# Patient Record
Sex: Female | Born: 1985 | Race: White | Hispanic: No | Smoking: Never smoker
Health system: Southern US, Community
[De-identification: ages and names within clinical notes are randomized; demographics above are authoritative.]

## PROBLEM LIST (undated history)

## (undated) DIAGNOSIS — J45909 Unspecified asthma, uncomplicated: Secondary | ICD-10-CM

## (undated) DIAGNOSIS — R011 Cardiac murmur, unspecified: Secondary | ICD-10-CM

## (undated) DIAGNOSIS — F319 Bipolar disorder, unspecified: Secondary | ICD-10-CM

## (undated) DIAGNOSIS — N809 Endometriosis, unspecified: Secondary | ICD-10-CM

## (undated) DIAGNOSIS — F419 Anxiety disorder, unspecified: Secondary | ICD-10-CM

## (undated) DIAGNOSIS — K219 Gastro-esophageal reflux disease without esophagitis: Secondary | ICD-10-CM

## (undated) DIAGNOSIS — E559 Vitamin D deficiency, unspecified: Secondary | ICD-10-CM

## (undated) DIAGNOSIS — D649 Anemia, unspecified: Secondary | ICD-10-CM

## (undated) DIAGNOSIS — L309 Dermatitis, unspecified: Secondary | ICD-10-CM

## (undated) DIAGNOSIS — J189 Pneumonia, unspecified organism: Secondary | ICD-10-CM

## (undated) DIAGNOSIS — F32A Depression, unspecified: Secondary | ICD-10-CM

## (undated) DIAGNOSIS — Q796 Ehlers-Danlos syndrome, unspecified: Secondary | ICD-10-CM

## (undated) DIAGNOSIS — F909 Attention-deficit hyperactivity disorder, unspecified type: Secondary | ICD-10-CM

## (undated) DIAGNOSIS — T8859XA Other complications of anesthesia, initial encounter: Secondary | ICD-10-CM

## (undated) DIAGNOSIS — K3184 Gastroparesis: Secondary | ICD-10-CM

## (undated) DIAGNOSIS — J069 Acute upper respiratory infection, unspecified: Secondary | ICD-10-CM

## (undated) DIAGNOSIS — F329 Major depressive disorder, single episode, unspecified: Secondary | ICD-10-CM

## (undated) HISTORY — DX: Depression, unspecified: F32.A

## (undated) HISTORY — DX: Vitamin D deficiency, unspecified: E55.9

## (undated) HISTORY — PX: OTHER SURGICAL HISTORY: SHX169

## (undated) HISTORY — PX: NASAL SEPTUM SURGERY: SHX37

## (undated) HISTORY — DX: Attention-deficit hyperactivity disorder, unspecified type: F90.9

## (undated) HISTORY — DX: Anxiety disorder, unspecified: F41.9

## (undated) HISTORY — PX: TONGUE FLAP: SHX2536

## (undated) HISTORY — PX: WISDOM TOOTH EXTRACTION: SHX21

## (undated) HISTORY — PX: UPPER GASTROINTESTINAL ENDOSCOPY: SHX188

## (undated) HISTORY — DX: Major depressive disorder, single episode, unspecified: F32.9

## (undated) HISTORY — DX: Acute upper respiratory infection, unspecified: J06.9

## (undated) HISTORY — DX: Bipolar disorder, unspecified: F31.9

## (undated) HISTORY — DX: Dermatitis, unspecified: L30.9

## (undated) HISTORY — PX: COLONOSCOPY: SHX174

## (undated) HISTORY — PX: SINOSCOPY: SHX187

---
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
## 2006-01-05 ENCOUNTER — Ambulatory Visit: Payer: Self-pay | Admitting: Psychology

## 2006-01-15 ENCOUNTER — Ambulatory Visit (HOSPITAL_COMMUNITY): Payer: Self-pay | Admitting: Psychiatry

## 2006-01-19 ENCOUNTER — Ambulatory Visit: Payer: Self-pay | Admitting: Psychologist

## 2006-02-22 ENCOUNTER — Ambulatory Visit: Payer: Self-pay | Admitting: Psychologist

## 2006-03-08 ENCOUNTER — Ambulatory Visit: Payer: Self-pay | Admitting: Psychologist

## 2006-03-21 ENCOUNTER — Ambulatory Visit: Payer: Self-pay | Admitting: Psychologist

## 2006-03-30 ENCOUNTER — Ambulatory Visit: Payer: Self-pay | Admitting: Psychologist

## 2006-05-11 ENCOUNTER — Ambulatory Visit: Payer: Self-pay | Admitting: Psychologist

## 2006-05-29 ENCOUNTER — Ambulatory Visit: Payer: Self-pay | Admitting: Psychologist

## 2006-06-06 ENCOUNTER — Ambulatory Visit (HOSPITAL_COMMUNITY): Payer: Self-pay | Admitting: Psychiatry

## 2006-06-07 ENCOUNTER — Ambulatory Visit: Payer: Self-pay | Admitting: Psychologist

## 2006-07-03 ENCOUNTER — Ambulatory Visit: Payer: Self-pay | Admitting: Psychologist

## 2006-07-19 ENCOUNTER — Ambulatory Visit: Payer: Self-pay | Admitting: Psychologist

## 2006-07-24 ENCOUNTER — Ambulatory Visit (HOSPITAL_COMMUNITY): Payer: Self-pay | Admitting: Psychiatry

## 2006-08-09 ENCOUNTER — Ambulatory Visit: Payer: Self-pay | Admitting: Psychologist

## 2006-08-21 ENCOUNTER — Ambulatory Visit: Payer: Self-pay | Admitting: Psychologist

## 2006-09-04 ENCOUNTER — Ambulatory Visit: Payer: Self-pay | Admitting: Psychologist

## 2006-09-14 ENCOUNTER — Ambulatory Visit: Payer: Self-pay | Admitting: Psychologist

## 2006-09-20 ENCOUNTER — Ambulatory Visit: Payer: Self-pay | Admitting: Psychologist

## 2006-10-12 ENCOUNTER — Ambulatory Visit: Payer: Self-pay | Admitting: Psychologist

## 2006-10-23 ENCOUNTER — Ambulatory Visit: Payer: Self-pay | Admitting: Psychologist

## 2006-12-04 ENCOUNTER — Ambulatory Visit: Payer: Self-pay | Admitting: Psychologist

## 2006-12-13 ENCOUNTER — Ambulatory Visit (HOSPITAL_COMMUNITY): Payer: Self-pay | Admitting: Psychiatry

## 2006-12-20 ENCOUNTER — Ambulatory Visit: Payer: Self-pay | Admitting: Psychologist

## 2007-01-16 ENCOUNTER — Ambulatory Visit: Payer: Self-pay | Admitting: Psychologist

## 2007-01-25 ENCOUNTER — Other Ambulatory Visit: Admission: RE | Admit: 2007-01-25 | Discharge: 2007-01-25 | Payer: Self-pay | Admitting: Family Medicine

## 2007-02-12 ENCOUNTER — Ambulatory Visit: Payer: Self-pay | Admitting: Psychologist

## 2007-02-26 ENCOUNTER — Ambulatory Visit: Payer: Self-pay | Admitting: Psychologist

## 2007-03-12 ENCOUNTER — Ambulatory Visit: Payer: Self-pay | Admitting: Psychologist

## 2007-03-19 ENCOUNTER — Ambulatory Visit: Payer: Self-pay | Admitting: Psychologist

## 2007-04-02 ENCOUNTER — Ambulatory Visit: Payer: Self-pay | Admitting: Psychologist

## 2007-04-09 ENCOUNTER — Ambulatory Visit: Payer: Self-pay | Admitting: Psychologist

## 2007-04-10 ENCOUNTER — Ambulatory Visit (HOSPITAL_COMMUNITY): Payer: Self-pay | Admitting: Psychiatry

## 2007-04-16 ENCOUNTER — Ambulatory Visit: Payer: Self-pay | Admitting: Psychologist

## 2007-04-30 ENCOUNTER — Ambulatory Visit: Payer: Self-pay | Admitting: Psychologist

## 2007-05-14 ENCOUNTER — Ambulatory Visit: Payer: Self-pay | Admitting: Psychologist

## 2007-05-28 ENCOUNTER — Ambulatory Visit: Payer: Self-pay | Admitting: Psychologist

## 2007-05-29 ENCOUNTER — Ambulatory Visit: Payer: Self-pay | Admitting: Psychologist

## 2007-06-04 ENCOUNTER — Ambulatory Visit: Payer: Self-pay | Admitting: Psychologist

## 2007-06-26 ENCOUNTER — Ambulatory Visit (HOSPITAL_COMMUNITY): Payer: Self-pay | Admitting: Psychiatry

## 2007-07-10 ENCOUNTER — Ambulatory Visit: Payer: Self-pay | Admitting: Psychologist

## 2007-07-23 ENCOUNTER — Ambulatory Visit: Payer: Self-pay | Admitting: Psychologist

## 2007-07-30 ENCOUNTER — Ambulatory Visit: Payer: Self-pay | Admitting: Psychologist

## 2007-08-13 ENCOUNTER — Ambulatory Visit: Payer: Self-pay | Admitting: Psychologist

## 2007-09-03 ENCOUNTER — Ambulatory Visit: Payer: Self-pay | Admitting: Psychologist

## 2007-09-05 ENCOUNTER — Ambulatory Visit (HOSPITAL_COMMUNITY): Payer: Self-pay | Admitting: Psychiatry

## 2007-09-17 ENCOUNTER — Ambulatory Visit: Payer: Self-pay | Admitting: Psychologist

## 2007-09-23 ENCOUNTER — Ambulatory Visit (HOSPITAL_COMMUNITY): Payer: Self-pay | Admitting: Psychiatry

## 2007-10-01 ENCOUNTER — Ambulatory Visit: Payer: Self-pay | Admitting: Psychologist

## 2007-10-15 ENCOUNTER — Ambulatory Visit: Payer: Self-pay | Admitting: Psychologist

## 2007-10-29 ENCOUNTER — Ambulatory Visit: Payer: Self-pay | Admitting: Psychologist

## 2007-12-10 ENCOUNTER — Ambulatory Visit: Payer: Self-pay | Admitting: Psychologist

## 2008-01-07 ENCOUNTER — Ambulatory Visit: Payer: Self-pay | Admitting: Pediatrics

## 2008-01-21 ENCOUNTER — Ambulatory Visit: Payer: Self-pay | Admitting: Psychologist

## 2008-02-18 ENCOUNTER — Other Ambulatory Visit: Admission: RE | Admit: 2008-02-18 | Discharge: 2008-02-18 | Payer: Self-pay | Admitting: Family Medicine

## 2008-03-03 ENCOUNTER — Ambulatory Visit: Payer: Self-pay | Admitting: Psychologist

## 2008-03-05 ENCOUNTER — Ambulatory Visit (HOSPITAL_COMMUNITY): Payer: Self-pay | Admitting: Psychiatry

## 2008-03-17 ENCOUNTER — Ambulatory Visit: Payer: Self-pay | Admitting: Psychologist

## 2008-03-19 ENCOUNTER — Encounter: Admission: RE | Admit: 2008-03-19 | Discharge: 2008-03-19 | Payer: Self-pay | Admitting: Gastroenterology

## 2008-03-31 ENCOUNTER — Ambulatory Visit: Payer: Self-pay | Admitting: Psychologist

## 2008-04-13 ENCOUNTER — Ambulatory Visit (HOSPITAL_COMMUNITY): Admission: RE | Admit: 2008-04-13 | Discharge: 2008-04-13 | Payer: Self-pay | Admitting: Gastroenterology

## 2008-04-14 ENCOUNTER — Ambulatory Visit: Payer: Self-pay | Admitting: Psychologist

## 2008-04-28 ENCOUNTER — Ambulatory Visit: Payer: Self-pay | Admitting: Psychologist

## 2008-04-30 ENCOUNTER — Ambulatory Visit (HOSPITAL_COMMUNITY): Payer: Self-pay | Admitting: Psychiatry

## 2008-05-07 ENCOUNTER — Ambulatory Visit: Admission: RE | Admit: 2008-05-07 | Discharge: 2008-05-07 | Payer: Self-pay | Admitting: Gastroenterology

## 2008-05-26 ENCOUNTER — Ambulatory Visit: Payer: Self-pay | Admitting: Psychologist

## 2008-06-02 ENCOUNTER — Ambulatory Visit: Payer: Self-pay | Admitting: Psychologist

## 2008-06-09 ENCOUNTER — Ambulatory Visit: Payer: Self-pay | Admitting: Psychologist

## 2008-07-07 ENCOUNTER — Ambulatory Visit: Payer: Self-pay | Admitting: Psychologist

## 2008-07-10 ENCOUNTER — Ambulatory Visit (HOSPITAL_COMMUNITY): Payer: Self-pay | Admitting: Psychiatry

## 2008-07-21 ENCOUNTER — Ambulatory Visit: Payer: Self-pay | Admitting: Psychologist

## 2008-08-04 ENCOUNTER — Ambulatory Visit: Payer: Self-pay | Admitting: Psychologist

## 2008-08-18 ENCOUNTER — Ambulatory Visit: Payer: Self-pay | Admitting: Psychologist

## 2008-09-01 ENCOUNTER — Ambulatory Visit: Payer: Self-pay | Admitting: Psychologist

## 2008-09-14 ENCOUNTER — Emergency Department (HOSPITAL_COMMUNITY): Admission: EM | Admit: 2008-09-14 | Discharge: 2008-09-14 | Payer: Self-pay | Admitting: Emergency Medicine

## 2008-09-15 ENCOUNTER — Ambulatory Visit: Payer: Self-pay | Admitting: Psychologist

## 2008-10-02 ENCOUNTER — Ambulatory Visit (HOSPITAL_COMMUNITY): Payer: Self-pay | Admitting: Psychiatry

## 2008-10-27 ENCOUNTER — Ambulatory Visit: Payer: Self-pay | Admitting: Psychologist

## 2008-11-17 ENCOUNTER — Ambulatory Visit: Payer: Self-pay | Admitting: Psychologist

## 2008-12-15 ENCOUNTER — Ambulatory Visit: Payer: Self-pay | Admitting: Psychologist

## 2008-12-29 ENCOUNTER — Ambulatory Visit: Payer: Self-pay | Admitting: Psychologist

## 2009-01-01 ENCOUNTER — Ambulatory Visit (HOSPITAL_COMMUNITY): Payer: Self-pay | Admitting: Psychiatry

## 2009-01-12 ENCOUNTER — Ambulatory Visit: Payer: Self-pay | Admitting: Psychologist

## 2009-01-26 ENCOUNTER — Ambulatory Visit: Payer: Self-pay | Admitting: Psychologist

## 2009-02-23 ENCOUNTER — Ambulatory Visit: Payer: Self-pay | Admitting: Psychologist

## 2009-03-09 ENCOUNTER — Ambulatory Visit: Payer: Self-pay | Admitting: Psychologist

## 2009-03-23 ENCOUNTER — Ambulatory Visit: Payer: Self-pay | Admitting: Psychologist

## 2009-03-26 ENCOUNTER — Ambulatory Visit (HOSPITAL_COMMUNITY): Payer: Self-pay | Admitting: Psychiatry

## 2009-04-02 ENCOUNTER — Other Ambulatory Visit: Admission: RE | Admit: 2009-04-02 | Discharge: 2009-04-02 | Payer: Self-pay | Admitting: Family Medicine

## 2009-04-06 ENCOUNTER — Ambulatory Visit: Payer: Self-pay | Admitting: Psychologist

## 2009-04-20 ENCOUNTER — Ambulatory Visit: Payer: Self-pay | Admitting: Psychologist

## 2009-05-18 ENCOUNTER — Ambulatory Visit: Payer: Self-pay | Admitting: Psychologist

## 2009-05-25 ENCOUNTER — Ambulatory Visit (HOSPITAL_COMMUNITY): Payer: Self-pay | Admitting: Psychiatry

## 2009-06-15 ENCOUNTER — Ambulatory Visit: Payer: Self-pay | Admitting: Psychologist

## 2009-07-13 ENCOUNTER — Ambulatory Visit: Payer: Self-pay | Admitting: Psychologist

## 2009-07-27 ENCOUNTER — Ambulatory Visit: Payer: Self-pay | Admitting: Psychologist

## 2009-08-24 ENCOUNTER — Ambulatory Visit: Payer: Self-pay | Admitting: Psychologist

## 2009-08-31 ENCOUNTER — Ambulatory Visit (HOSPITAL_COMMUNITY): Payer: Self-pay | Admitting: Psychiatry

## 2009-09-21 ENCOUNTER — Ambulatory Visit: Payer: Self-pay | Admitting: Psychologist

## 2009-10-19 ENCOUNTER — Ambulatory Visit: Payer: Self-pay | Admitting: Psychologist

## 2009-11-13 HISTORY — PX: ESOPHAGOGASTRODUODENOSCOPY: SHX1529

## 2009-11-30 ENCOUNTER — Ambulatory Visit: Payer: Self-pay | Admitting: Psychologist

## 2009-12-07 ENCOUNTER — Ambulatory Visit: Payer: Self-pay | Admitting: Psychologist

## 2009-12-13 ENCOUNTER — Ambulatory Visit (HOSPITAL_COMMUNITY): Payer: Self-pay | Admitting: Psychiatry

## 2009-12-21 ENCOUNTER — Ambulatory Visit: Payer: Self-pay | Admitting: Psychologist

## 2010-01-04 ENCOUNTER — Ambulatory Visit: Payer: Self-pay | Admitting: Psychologist

## 2010-02-01 ENCOUNTER — Ambulatory Visit: Payer: Self-pay | Admitting: Psychologist

## 2010-02-15 ENCOUNTER — Ambulatory Visit: Payer: Self-pay | Admitting: Psychologist

## 2010-03-29 ENCOUNTER — Ambulatory Visit: Payer: Self-pay | Admitting: Psychologist

## 2010-04-18 ENCOUNTER — Ambulatory Visit (HOSPITAL_COMMUNITY): Payer: Self-pay | Admitting: Psychiatry

## 2010-04-26 ENCOUNTER — Ambulatory Visit: Payer: Self-pay | Admitting: Psychologist

## 2010-05-17 ENCOUNTER — Ambulatory Visit: Payer: Self-pay | Admitting: Psychologist

## 2010-05-24 ENCOUNTER — Ambulatory Visit: Payer: Self-pay | Admitting: Psychologist

## 2010-06-07 ENCOUNTER — Other Ambulatory Visit: Admission: RE | Admit: 2010-06-07 | Discharge: 2010-06-07 | Payer: Self-pay | Admitting: Family Medicine

## 2010-06-20 ENCOUNTER — Ambulatory Visit (HOSPITAL_COMMUNITY): Payer: Self-pay | Admitting: Psychiatry

## 2010-06-21 ENCOUNTER — Ambulatory Visit: Payer: Self-pay | Admitting: Pediatrics

## 2010-07-19 ENCOUNTER — Ambulatory Visit: Payer: Self-pay | Admitting: Psychologist

## 2010-08-02 ENCOUNTER — Ambulatory Visit: Payer: Self-pay | Admitting: Psychologist

## 2010-08-16 ENCOUNTER — Ambulatory Visit (HOSPITAL_COMMUNITY): Payer: Self-pay | Admitting: Psychiatry

## 2010-09-13 ENCOUNTER — Ambulatory Visit: Payer: Self-pay | Admitting: Psychologist

## 2010-09-27 ENCOUNTER — Ambulatory Visit: Payer: Self-pay | Admitting: Psychologist

## 2010-10-11 ENCOUNTER — Ambulatory Visit: Payer: Self-pay | Admitting: Psychologist

## 2010-10-18 ENCOUNTER — Ambulatory Visit (HOSPITAL_COMMUNITY): Payer: Self-pay | Admitting: Psychiatry

## 2010-10-25 ENCOUNTER — Ambulatory Visit: Payer: Self-pay | Admitting: Psychologist

## 2010-11-22 ENCOUNTER — Ambulatory Visit
Admission: RE | Admit: 2010-11-22 | Discharge: 2010-11-22 | Payer: Self-pay | Source: Home / Self Care | Attending: Psychologist | Admitting: Psychologist

## 2010-11-22 ENCOUNTER — Encounter
Admission: RE | Admit: 2010-11-22 | Discharge: 2010-11-22 | Payer: Self-pay | Source: Home / Self Care | Attending: Allergy and Immunology | Admitting: Allergy and Immunology

## 2010-12-06 ENCOUNTER — Ambulatory Visit: Admit: 2010-12-06 | Payer: Self-pay | Admitting: Psychologist

## 2010-12-13 ENCOUNTER — Ambulatory Visit
Admission: RE | Admit: 2010-12-13 | Discharge: 2010-12-13 | Payer: Self-pay | Source: Home / Self Care | Attending: Psychologist | Admitting: Psychologist

## 2010-12-20 ENCOUNTER — Ambulatory Visit: Payer: Commercial Managed Care - PPO | Admitting: Psychologist

## 2010-12-20 DIAGNOSIS — F3110 Bipolar disorder, current episode manic without psychotic features, unspecified: Secondary | ICD-10-CM

## 2011-01-03 ENCOUNTER — Ambulatory Visit: Payer: 59 | Admitting: Psychologist

## 2011-01-03 DIAGNOSIS — F3110 Bipolar disorder, current episode manic without psychotic features, unspecified: Secondary | ICD-10-CM

## 2011-01-17 ENCOUNTER — Ambulatory Visit: Payer: Medicaid Other | Admitting: Psychologist

## 2011-01-17 DIAGNOSIS — F3110 Bipolar disorder, current episode manic without psychotic features, unspecified: Secondary | ICD-10-CM

## 2011-01-24 ENCOUNTER — Encounter (INDEPENDENT_AMBULATORY_CARE_PROVIDER_SITE_OTHER): Payer: Medicaid Other | Admitting: Psychiatry

## 2011-01-24 DIAGNOSIS — F909 Attention-deficit hyperactivity disorder, unspecified type: Secondary | ICD-10-CM

## 2011-01-24 DIAGNOSIS — F319 Bipolar disorder, unspecified: Secondary | ICD-10-CM

## 2011-01-31 ENCOUNTER — Ambulatory Visit: Payer: Commercial Managed Care - PPO | Admitting: Psychologist

## 2011-01-31 DIAGNOSIS — F3110 Bipolar disorder, current episode manic without psychotic features, unspecified: Secondary | ICD-10-CM

## 2011-02-07 ENCOUNTER — Other Ambulatory Visit: Payer: Self-pay | Admitting: Dermatology

## 2011-02-14 ENCOUNTER — Ambulatory Visit: Payer: Commercial Managed Care - PPO | Admitting: Psychologist

## 2011-02-14 DIAGNOSIS — F3110 Bipolar disorder, current episode manic without psychotic features, unspecified: Secondary | ICD-10-CM

## 2011-02-28 ENCOUNTER — Ambulatory Visit: Payer: 59 | Admitting: Psychologist

## 2011-02-28 DIAGNOSIS — F3110 Bipolar disorder, current episode manic without psychotic features, unspecified: Secondary | ICD-10-CM

## 2011-03-14 ENCOUNTER — Ambulatory Visit: Payer: 59 | Admitting: Psychologist

## 2011-03-21 ENCOUNTER — Ambulatory Visit: Payer: 59 | Admitting: Psychologist

## 2011-03-21 DIAGNOSIS — F3110 Bipolar disorder, current episode manic without psychotic features, unspecified: Secondary | ICD-10-CM

## 2011-03-28 ENCOUNTER — Ambulatory Visit: Payer: Self-pay | Admitting: Psychologist

## 2011-03-28 NOTE — Op Note (Signed)
NAME:  Rachel [name], Rachel [name]         ACCOUNT NO.:  [id number]   MEDICAL RECORD NO.:  [id number]          PATIENT TYPE:  AMB   LOCATION:  ENDO                         FACILITY:  [facility]   PHYSICIAN:  John C. Madilyn Fireman, M.D.    DATE OF BIRTH:  Sep 06, 1986   DATE OF PROCEDURE:  04/13/2008  DATE OF DISCHARGE:  04/13/2008                               OPERATIVE REPORT   INDICATIONS FOR PROCEDURE:  The patient was presumed refractory reflux  symptoms undergoing evaluation for possible antireflux surgery.   RESULTS:  1. Upper esophageal sphincter:  Normal.  2. Esophageal body study normal with 90% peristaltic contractions of      normal amplitude velocity and duration.  3. Lower esophageal pressure:  Resting pressure of 33 mm, 67%      relaxation which is normal.   IMPRESSION:  Normal study.   PLAN:  We will proceed with a 24-hour pH esophageal study.           ______________________________  Everardo All Madilyn Fireman, M.D.     JCH/MEDQ  D:  04/19/2008  T:  04/19/2008  Job:  [id number]   cc:   Jethro Bastos, M.D.

## 2011-03-28 NOTE — Op Note (Signed)
NAME:  Rachel [name], Rachel [name]         ACCOUNT NO.:  [id number]   MEDICAL RECORD NO.:  [id number]          PATIENT TYPE:  AMB   LOCATION:  ENDO                         FACILITY:  [facility]   PHYSICIAN:  John C. Madilyn Fireman, M.D.    DATE OF BIRTH:  02/15/1986   DATE OF PROCEDURE:  04/13/2008  DATE OF DISCHARGE:  04/13/2008                               OPERATIVE REPORT   INDICATIONS FOR PROCEDURE:  Presumed medically refractory  gastroesophageal reflux under consideration for antireflux surgery.   RESULTS:  Total DeMeester score was 0.9.  No symptoms were recorded  during the procedure.  Total reflux episodes were two.   IMPRESSION:  Normal 24-hour pH study with very minimal demonstrated  reflux.   PLAN:  The patient will need further evaluation of symptoms and this  study does not support antireflux surgery based on normal score values.           ______________________________  Everardo All Madilyn Fireman, M.D.     JCH/MEDQ  D:  04/19/2008  T:  04/19/2008  Job:  [id number]   cc:   Jethro Bastos, M.D.

## 2011-03-31 NOTE — Discharge Summary (Signed)
NAME:  RAYCHELLE, HUDMAN                   ACCOUNT NO.:  [id number]   MEDICAL RECORD NO.:  [id number]                   PATIENT TYPE:  INP   LOCATION:  0101                                 [facility]ACILITY[facility]  [facility]   PHYSICIAN:  Nawar M. Alnaquib, M.D.             DATE OF BIRTH:  [birth year]   DATE OF ADMISSION:  12/22/2002  DATE OF DISCHARGE:  12/29/2002                                 DISCHARGE SUMMARY   IDENTIFYING INFORMATION:  This is a 25 year old white female admitted on  December 22, 2002, for danger, acutely manic.  She was a known patient with  bipolar affective disorder.  There had been some hallucinations with  auditory and visual hallucinations and she was very agitated, very  irritable, and at times, very labile and crying.  She was seeing shadow-like  figures.  She was imaging there was burglars coming to the room.  At times,  she was very sad, at times, she was too happy.  Her parents accompanied her  to the interview.   REASON FOR ADMISSION:  Reason for admission was to stabilize her mood and  regulate her medications.   ADMISSION DIAGNOSES:   AXIS I:  Bipolar affective disorder, manic.   AXIS II:  None.   AXIS III:  Gastroesophageal reflux disease.   AXIS IV:  Psychosocial stressors, educational.   AXIS V:  Global assessment of functioning 35.   DISCHARGE MEDICATIONS:  1. Depakote ER 1000 mg at bedtime.  2. Abilify 15 mg at bedtime.  3. Celexa 20 mg at bedtime.   MENTAL STATUS EXAM ON ADMISSION:  She looked very hypomanic.  Speech:  Somewhat pressurized.  But she was interactive, somewhat tense.  She had  gross ideas of reference.   LABORATORY TESTS:  White blood cell count was 6.4, which was normal;  hemoglobin 13.2, which was normal; hematocrit 38.2, which was normal.  Routine chemistry was normal.  Liver function tests were normal.  TSH and  free T4 were all normal.  Urine pregnancy test was negative.  Serum Depakote  level was 82.6, which was within the  therapeutic range.  Urine drug screen:  Negative for alcohol and drugs.  Urinalysis was negative.  Urine microscopy  was negative.  Test for Chlamydia was negative.  These were all the  laboratory tests that were done on her during her stay in the hospital.  All  were remarkably normal.   HOSPITAL COURSE:  During her stay, her medications were maneuvered somewhat.  Her dose of Abilify was increased to 20 mg at bedtime on December 23, 2002,  and her Depakote ER was increased to 1250 mg at bedtime.  She responded well  to these changes.  During her hospital stay, she was also having some  wheezing due to her asthma problems for which she received terbutaline.  She  continued on Celexa 20 mg at bedtime.  It was initially held.  She was also  continued on  Gabitril 4 mg t.i.d.  She was also on Nexium and Pepcid, and  those were continued as well as Klonopin was added 1 mg b.i.d.  Klonopin was  eventually discontinued as the patient stabilized.   DISCHARGE MEDICATIONS:  1. Depakote ER 1250 mg at bedtime.  2. Nexium 40 mg daily.  3. Gabitril 4 mg t.i.d.  4. Abilify 20 mg at bedtime.  5. Asthma medications as prescribed by outpatient primary care physician.   MENTAL STATUS EXAM ON DISCHARGE:  She had improved remarkably after having  participated in groups and individual therapy.  She was euthymic.  She was  calm, not agitated.  She was not labile.  Her speech was not pressurized.  Her ideas were on target.  She was discharged in a stable mental and  physical condition.   FOLLOW UP:  Her followup appointment was to be arranged in outpatient to see  her therapist, Bunnie Philips, February 17.  She was going to see myself in  the clinic on January 01, 2003.   DISCHARGE DIAGNOSES:   AXIS I:  Bipolar affective disorder, mixed.   AXIS II:  Deferred.   AXIS III:  Asthma, by history.   AXIS IV:  Psychosocial stressors, educational.   AXIS V:  Global assessment of functioning 50.                                                Nawar M. Alnaquib, M.D.    NMA/MEDQ  D:  01/15/2003  T:  01/16/2003  Job:  [id number]

## 2011-03-31 NOTE — Discharge Summary (Signed)
NAME:  Rachel [name], Rachel [name]                   ACCOUNT NO.:  [id number]   MEDICAL RECORD NO.:  [id number]                   PATIENT TYPE:  INP   LOCATION:  0199                                 [facility]ACILITY[facility]  [facility]   PHYSICIAN:  Cindie Crumbly, M.D.               DATE OF BIRTH:  Aug 25, 1986   DATE OF ADMISSION:  11/27/2002  DATE OF DISCHARGE:  12/04/2002                                 DISCHARGE SUMMARY   REASON FOR ADMISSION:  This 25 year old white female was admitted  complaining of racing thought and exacerbation of mania.  For further  history of present illness, please see the patient's psychiatric admission  assessment.  Her physical examination at the time of admission was  significant for a history of asthma and gastroesophageal reflux disease.  She had an otherwise unremarkable physical examination.   LABORATORY DATA:  The patient underwent a laboratory workup to rule out any  other medical problems contributing to her symptomatology.  Hepatic panel  was within normal limits.  CBC showed an MCHC of 35.3 and was otherwise  unremarkable.  Urine probe for gonorrhea and Chlamydia were negative.  Basic  metabolic panel was within normal limits.  Hepatic panel was within normal  limits.  GGT was within normal limits.  Urine pregnancy test was negative.  TSH and free T4 were within normal limits.  Urine drug screen was negative.  UA was unremarkable.  RPR was nonreactive.  The patient received no x-rays,  no special procedures, no additional consultations.  She sustained no  complications during the course of this hospitalization.   HOSPITAL COURSE:  On admission, the patient was taking Strattera, Risperdal,  melatonin, Gabitril and Lamictal.  She was beginning to be titrated up on  Lamictal when she met with Dr. Mariana Single and her parents developed a  treatment program revolving around a change of medication.  At that point in  time, the patient was continued on Gabitril.  Lamictal was  tapered and  discontinued.  She was begun on a trial of Depakote at 750 mg at bedtime and  had a level at steady state of 54.  Her Depakote was then increased to 1000  mg p.o. q.h.s. at discharge.  She has been titrated downward on Risperdal to  1 mg p.o. b.i.d.  She was continued on melatonin at 1 mg p.o. q.h.s.  Abilify has been added to her regimen, titrated up to 15 mg p.o. q.h.s.  At  the time of admission, the patient showed a depressed, anxious, irritable  and labile mood.  Her speech was pressured with occasional flight of ideas.  Her concentration was decreased with poor impulse control, psychomotor  agitation, and her thoughts were racing and disorganized.  With her current  medication regimen, she has shown significant improvement.  Denies any  suicidal or homicidal ideation at the time of discharge and her mood  disorder is resolving.  She denies any symptoms of depression, anxiety  and  symptoms of mania are resolving.  She no longer appears to be a danger to  herself or others.  Her affect and mood have improved and, consequently, she  is felt to have reached her maximum benefits of hospitalization and is ready  for discharge to a less restrictive alternative setting.   CONDITION ON DISCHARGE:  Improved.   DIAGNOSES (ACCORDING TO DSM-IV):   AXIS I:  1. Bipolar disorder, mixed-type, severe without psychosis.  2. Generalized anxiety disorder.   AXIS II:  Rule out learning disorder not otherwise specified.   AXIS III:  1. Gastroesophageal reflux disease.  2. Asthma.   AXIS IV:  Current psychosocial stressors are severe.   AXIS V:  20 on admission; 30 on discharge.   FURTHER EVALUATION AND TREATMENT RECOMMENDATIONS:  1. The patient is discharged to home.  2. She is discharged on an unrestricted level of activity and a regular     diet.  3. She is discharged on Vioxx 20 mg p.o. q.d., melatonin 1 mg p.o. q.h.s.,     Pepcid AC 20 mg p.o. q.a.m., albuterol inhaler 2  puffs q.4h. p.r.n. for     wheezing, Gabitril 4 mg p.o. t.i.d., Abilify 15 mg p.o. q.h.s., Risperdal     1 mg p.o. b.i.d., Depakote ER 1000 mg p.o. q.h.s.  4. She will follow up with Dr. Ladona Ridgel at [facility] for all further aspects of her psychiatric care and,     consequently, I will sign off on the case at this time.  She will follow     up with her primary care physician for all further aspects of her medical     care.                                               Cindie Crumbly, M.D.    TS/MEDQ  D:  12/04/2002  T:  12/04/2002  Job:  [id number]

## 2011-03-31 NOTE — Discharge Summary (Signed)
Rachel [name], SCHWENKE         ACCOUNT NO.:  [id number]   MEDICAL RECORD NO.:  [id number]          PATIENT TYPE:  INP   LOCATION:  0103                          [facility]ACILITY[facility]  [facility]   PHYSICIAN:  Lalla Brothers, MDDATE OF BIRTH:  December 24, 1985   DATE OF ADMISSION:  03/20/2005  DATE OF DISCHARGE:  03/23/2005                                 DISCHARGE SUMMARY   IDENTIFICATION:  An 25 year old female, 11th grade student at [employer] was admitted emergently, voluntarily on emergent referral from Dr.  Carolanne Grumbling for inpatient psychiatric stabilization and treatment of  suicide ideation and depression.  The patient is currently depressed but  manifesting dissociative symptoms while having a history of neurotoxic side  effects from several medications.  She has had partial rages injurious to  family and is not endorsing preparedness for adult life nor responsibility  for her current behaviors.  Regressive quality is evident, especially about  school and medications.  For full details please see the typed admission  assessment.   SYNOPSIS OF PRESENT ILLNESS:  Parents are aware of a several-month history  of partial rage, irritability and moodiness.  The patient reports depression  particularly surrounding individuation and separation themes, as well as her  own awareness of her limited learning efficacy.  Depression has been most  prominent over the last several weeks.  She has developed suicide ideation.  She seems to have associations for teasing since elementary school.  She has  several female friends with whom she is to attend prom, but overall has few  friends and limited social life.  She seems stressed by the prospect of  attending prom, even though she in other ways looks forward to it.  She has  atypical depression and hysteroid dysphoria.  She has no hallucinations,  particularly those of 2 years ago during her last hospitalization in 2004.  She reports allergy to  Zyprexa causing manic symptoms, Benadryl causing  hallucinations, Wellbutrin causing depression, lanolin, antibiotics and  antidepressants as well as dogs.  Some of these are obviously neurotoxic  reactions rather than typical allergies.  Father and younger sister have  bipolar disorder as did paternal grandmother.  Mother has major depression  and multiple medical problems.  Maternal great-grandfather and great-  grandmother have substance abuse with alcohol.   INITIAL MENTAL STATUS EXAM:  The patient had a wide-eyed, rather abulic  facies and a mechanical posture, though without other encephalopathic or  neurotoxic symptoms or side effects.  She however is reasonably verbally  capable at processing symptoms and interventions, though being rather  concrete in content.  Auditory processing difficulties may be present.  The  patient is somewhat disorganized but not floridly psychotic.  She does have  dissociative symptoms in describing watching herself in her angry outbursts  with partial rage, as though out of body.   LABORATORY FINDINGS:  Comprehensive metabolic panel fasting on admission was  normal, except albumin low at 3.4 with lower limit of normal 3.5.  Sodium  was normal at 139, potassium 3.5, fasting glucose 82, creatinine 0.7,  calcium 9.4, AST 24 and ALT 16.  GGT was  normal at 12.  Fasting lipid  profile after a 10-hour fast revealed total cholesterol slightly elevated at  179 with upper limit of normal for adolescents being 170, though the patient  is now 19.  Triglyceride was normal at 98 fasting, HDL cholesterol normal at  46, and LDL cholesterol borderline elevated at 113, with upper limit of  normal 109.  Free T4 was normal at 1.18 and TSH at 3.752.  Urine HCG was  negative.  Depakote level was 117 mcg/ml 10 hours after dose of 1500 mg ER  q.h.s..  Urine drug screen was negative except for the presence of  Dexedrine, having urine amphetamine of 9300 ng/ml with creatinine  of 121  mg/dl and otherwise urine drug screen negative.  Urinalysis was normal, with  specific gravity of 1.021 but with a trace of occult blood and 0-2 WBC and 0-  2 RBC.  RPR was nonreactive.  Urine probes for gonorrhea and chlamydia  trichomatous by DNA amplification were both negative.   HOSPITAL COURSE AND TREATMENT:  General medical exam by Vic Ripper,  PA-C noted menarche at age 68 with irregular menses and she is not sexually  active.  She has gastroesophageal reflux.  She has a history of bronchitis.  She has contact lenses.  She feels she is getting some abdominal fat.  She  was Tanner stage 5 with no previous GYN exam.  Admission height was 60  inches and weight 129.5 pounds, with blood pressure 131/88 with heart rate  of 117 sitting and 121/97 with heart rate of 128 standing.  On the second  hospital day, supine blood pressure was 94/62 with heart rate of 65 and  standing blood pressure was 108/79 with heart rate of 96.  On the day of  discharge, supine blood pressure was 104/67 with heart rate of 80 and  standing blood pressure 112/81 with heart rate of 96.  The patient's  medications were continued without change during the hospital stay except  Abilify was increased by 5 mg to 25 mg nightly, having been decreased by Dr.  Ladona Ridgel in the not too distant past.  The patient gradually engaged in  treatment program.  She was able to address dissociative and partial rage  symptoms for resolution.  She was able to address family object relations  issues, particularly with younger sister, as well as individuation and  separation issues.  The patient was able to resolve suicide ideation and to  generalize to family, school, and prom commitment to attend safely and  successfully to relations and activities.  Any concern over learning  disorders such as auditory processing difficulties is deferred to her next  appointment with Dr. Jolene Provost when current depressive symptoms  and dissociative symptoms are resolved.  She required no seclusion or restraint  during the hospital stay.   FINAL DIAGNOSES:  AXIS 1:  1.  Bipolar disorder, depressed, severe.  2.  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  3.  Attention deficit hyperactivity disorder, combined type, moderate      severity.  4.  Parent-child problem.  5.  Other specified family circumstances.  6.  Other interpersonal problem.  AXIS II:  Rule out learning disorder not otherwise specified with auditory processing  difficulties.  AXIS III:  1.  Gastroesophageal reflux disorder.  2.  Allergic rhinitis and asthma.  3.  Irregular menses.  4.  Borderline total and LDL cholesterol.  AXIS IV:  Stressors:  Family - severe, acute and chronic; phase of life - severe,  acute and chronic.  AXIS V:  Global assessment of function on admission 38 and highest in last year 73  and discharge global assessment of function was 56.   PLAN:  The patient was discharged to parents in improved condition after a  combined family therapy conference with the patient and younger sister.  Younger sister was admitted to the hospital on the children's wing on the  day after the patient's admission.  The patient indicated that she loved and  wanted to be a role model for the sister even though she had not been  manifesting such on an outpatient basis.  The patient was discharged on the  following medications:  1.  Dexedrine 10 mg SR spansules to take 2 every morning, quantity #60 with      no refill prescribed.  2.  Dexedrine 5 mg regular tablets to take every 3 p.m., quantity #60 with      no refill prescribed.  3.  Risperdal 0.5 mg tablet to take 1 every morning and 3 p.m. and 2 every      bedtime, quantity #120 with no refill prescribed and p.r.n. use is not      expected to be necessary.  4.  Abilify 10 mg tablet to take 2.5 every bedtime, quantity #75 with no      refill  prescribed.  5.  Depakote 500 mg ER tablets to take 3 every bedtime, quantity #90 with no      refill prescribed.  6.  Melatonin 600 mcg every bedtime, own home supply.  7.  Nexium 40 mg twice daily, own home supply.  8.  Singulair 10 mg every bedtime, own home supply.  9.  Vita supplement daily, own home supply.  10. Albuterol inhaler 2 puffs every 4 hours as needed for asthma, own home      supply.  The patient follows  a weight control diet and has no restrictions on  physical activity.  Crisis and safety plans are outlined if needed.  The  patient will see Dr. Jolene Provost Mar 24, 2005 at 8 a.m. for therapy and  ongoing monitoring.  She will see Dr. Ladona Ridgel Mar 28, 2005 at 1330 for  psychiatric followup.  She continues primary care with Dr. Particia Jasper  and it is necessary to attempt to minimize atypical anti-psychotic usage  over time considering the borderline elevated cholesterol.  However it is  not possible at this time to decrease but actually had to increase by 5 mg  the Abilify for symptoms.      GEJ/MEDQ  D:  03/24/2005  T:  03/24/2005  Job:  [id number]   cc:   Juan Quam, M.D.  [address]  [place]  [place] [address]  Fax: [phone]   [facility]  [address]  [place], [place] [address]   Carolanne Grumbling, M.D.

## 2011-03-31 NOTE — H&P (Signed)
NAME:  DEMICA, ZOOK                   ACCOUNT NO.:  [id number]   MEDICAL RECORD NO.:  [id number]                   PATIENT TYPE:  INP   LOCATION:  0101                                 [facility]ILITY[facility]  [facility]   PHYSICIAN:  Nawar M. Alnaquib, M.D.             DATE OF BIRTH:  06/29/1986   DATE OF ADMISSION:  12/22/2002  DATE OF DISCHARGE:                         PSYCHIATRIC ADMISSION ASSESSMENT   HISTORY OF PRESENT ILLNESS:  This is a 25 year old white female admitted  today for being manic, some hallucinations, auditory and visual, are  reported.  She has known bipolar affective disorder, rapid cycler, and has  not been feeling well this week.  She had a cold, she came up with a rash  over her abdomen, and she was very manic.  She was very agitated and angry.  She was brought in by mom for admission.  The patient had told a teacher at  school that she was very angry and felt like smacking other students in the  face as they were talking about her or saying that she had an inappropriate  relationship with a teacher, which, of course, she says is not true.  She  reports that I'm happy and I'm also sad, but I'm also angry.  I'm still  angry a little bit.   PAST PSYCHIATRIC HISTORY:  Known bipolar affective disorder with history of  multiple hospitalizations.  The most recent one was last month at [facility].   SUBSTANCE ABUSE HISTORY:  Completely denies.   PAST MEDICAL HISTORY:  Past medical history is significant for  gastroesophageal reflux disease.   ALLERGIES:  None.   CURRENT MEDICATIONS:  1. Depakote ER 1500 mg at bedtime.  2. Abilify 15 mg at bedtime.  3. Celexa 20 mg at bedtime.  4. Pepcid.  5. Nexium.  6. Klonopin, which was prescribed in the latter part of last week 1 mg     b.i.d. but was not given and skipped once or twice because it made her     feel very sleepy.   FAMILY, SOCIAL, AND ENVIRONMENTAL HISTORY:  Please see old chart.   MENTAL STATUS EXAM:  The patient looks hypomanic, appears angry.  Speech is  somewhat pressurized.  However, she appears interactive with good eye  contact.  Looks bright but is somewhat tired.  At times, she describes  herself as somewhat tired.  Mood is incongruent with affect.  Insight and  judgment: Poor.   ADMISSION DIAGNOSES:   AXIS I:  Bipolar affective disorder, manic.   AXIS II:  Deferred.   AXIS III:  Gastroesophageal reflux disease.   AXIS IV:  Psychosocial stressors: Educational.   AXIS V:  Global assessment of functioning 35.   ASSETS AND STRENGTHS:  Fairly good IQ and supportive family.   INITIAL PLAN OF CARE:  The patient wants to go home early before Valentine's  day because on February 11 is her mother's birthday and  she wants to go an  buy her a gift.  The initial plan of care is to continue medications as  those at home, no change.  Continue therapy.   ESTIMATED LENGTH OF STAY:  One week.   POST HOSPITAL CARE PLAN:  Initial discharge plan is home.                                               Nawar M. Alnaquib, M.D.    NMA/MEDQ  D:  12/22/2002  T:  12/22/2002  Job:  [id number]

## 2011-03-31 NOTE — Op Note (Signed)
NAME:  Rachel, [name]                   ACCOUNT NO.:  [id number]   MEDICAL RECORD NO.:  [id number]                   PATIENT TYPE:  OIB   LOCATION:  2550                                 FACILITY:  [facility]   PHYSICIAN:  Kristine Garbe. Ezzard Standing, M.D.         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   OPERATION PERFORMED:  Septoplasty, bilateral inferior turbinate reductions.   SURGEON:  Kristine Garbe. Ezzard Standing, M.D.   ANESTHESIA:  General endotracheal.   COMPLICATIONS:  None.   INDICATIONS FOR PROCEDURE:  Rachel [name] is a 25 year old who had had  chronic problems with sinus complaints where she describes congestion, pain,  pressure in her head.  She has had a CT scan for her sinuses which show  clear paranasal sinuses.  Of note, she does have a significant septal  deviation to the right with the septum protruding to the inferior turbinate  and the left-sided compensatory turbinate hypertrophy.  She is taken to the  operating room at this time for septoplasty and bilateral inferior turbinate  reductions.   DESCRIPTION OF PROCEDURE:  After adequate endotracheal anesthesia, the  patient received 1g Ancef IV preoperatively.  The nose was prepped with  Betadine solution and draped out with sterile towels.  The septum and  turbinates were then injected with Xylocaine with epinephrine.  A  hemitransfixion incision was made along the septum on the right side.  Mucoperichondrial and mucoperiosteal flaps were elevated posteriorly.  The  cartilaginous septum was bowed off of the maxillary crest protruding to the  right inferior  turbinate.  The cartilage that was protruding into the  inferior turbinate was excised.  The maxillary crest was fractured back to  midline.  This allowed the septum to return more to midline.  Following  this, inferior turbinate reductions were performed.  Incision was made on  the inferior edge of the turbinate.  Mucosal flaps were elevated off the  turbinate bone and then the turbinate bone was removed bilaterally and  submucosal cauterization was performed with suction cautery and bipolar  cautery.  The posterior portion of the inferior turbinates was then  outfractured.  This completed the procedure.  The hemitransfixion incision  was closed with interrupted 4-0 chromic suture.  The septum was basted with  a 4-0 chromic suture and Telfa soaked in bacitracin ointment was placed in  both sides of the nose for packing .   DISPOSITION:  Rachel [name] is admitted to the hospital for overnight  observation and will be discharged in the morning after removing nasal  packing.  Kristine Garbe. Ezzard Standing, M.D.    CEN/MEDQ  D:  10/05/2003  T:  10/05/2003  Job:  [id number]   cc:   Juan Quam, M.D.  [address], [address]  [place]  [place] [address]  Fax: [phone]

## 2011-03-31 NOTE — H&P (Signed)
NAME:  Rachel [name], Rachel [name]                   ACCOUNT NO.:  [id number]   MEDICAL RECORD NO.:  [id number]                   PATIENT TYPE:  INP   LOCATION:  0199                                 [facility]   PHYSICIAN:  Cindie Crumbly, M.D.               DATE OF BIRTH:  05-29-1986   DATE OF ADMISSION:  11/27/2002  DATE OF DISCHARGE:                         PSYCHIATRIC ADMISSION ASSESSMENT   PATIENT IDENTIFICATION:  This 25 year old white female was admitted  complaining of racing thoughts and an exacerbation of bipolar disorder.   HISTORY OF PRESENT ILLNESS:  The patient complains of an increasingly  depressed, anxious, labile, and agitated mood to a point where she was  profoundly psychomotor agitated and unable to function at school.  She was  unable to function at home where she became agitated and combative.  She  admitted to anhedonia, crying continually for the past several days prior to  admission, unable to function in any environment.  She admits to rapid  cycling of mood, thought racing, pressured speech, increased  distractibility.  She admits to hypersomnia, sleeping 10-12 hours a night  secondary to taking Risperdal.  She has been increasingly intrusive and  psychomotor agitated, causing her to be ostracized and having threats made  against her by peers at school.  She admits to flight of ideas and  psychomotor agitation.  Her parents report that her medications are not  working and wish the patient to be reevaluated.  The patient's outpatient  psychiatrist, Carolanne Grumbling, M.D., concurs with this and states a need for  adjustment of her medication is indicated.   PAST PSYCHIATRIC HISTORY:  The patient's past psychiatric history is  significant for her being an inpatient on three separate occasions, twice a  [facility] and once at [facility].  The most  recent admission was in 2001 at [facility].  The patient has a history  of  bipolar mixed disorder as well as an anxiety disorder.  Her past  medications include no response and increasing rapid cycling while taking  antidepressants.  She reports that stimulants have caused her increased  agitation and no response.  Zyprexa, in the past, has caused significant  weight gain and sedation.  While taking lithium, she had severe nystagmus  and was unable to tolerate the medicine.   SUBSTANCE ABUSE HISTORY:  She denies any use of alcohol, tobacco, or street  drugs.   PAST MEDICAL HISTORY:  The patient's past medical history is significant for  gastroesophageal reflux disease and asthma.   ALLERGIES:  She is allergic to [facility].  She denies any drug allergies or  sensitivities.   CURRENT MEDICATIONS:  1. Risperdal 2 mg in the morning, 2.5 mg in the afternoon, and 2 mg q.h.s.  2. Strattera 40 mg p.o. q.d., which the patient reports has caused her     significant agitation.  3. Seroquel 25 mg p.o. q.h.s. was recently started  and she complains of     excessive sedation from the Seroquel.  4. Gabitril 4 mg p.o. t.i.d.  5. Lamictal 50 mg p.o. q.a.m. and q.p.m. and 100 mg q.h.s.  6. Melatonin 1 mg p.o. q.h.s.  7. The patient is also taking Vioxx.  8. Nexium.  9. Pepcid AC.  10.      The patient has used an albuterol inhaler in the past for     exacerbation of asthma.   FAMILY AND SOCIAL HISTORY:  The patient lives with her mother who is an Charity fundraiser,  father who is a Teacher, early years/pre, and sister who is 6 years of age.  Father has a  history of bipolar disorder.  Sister has a history of bipolar disorder.  Mother has a history of major depression.  Paternal grandmother has a  history of bipolar disorder.  Maternal grandparents have a history of  alcoholism.  The patient is currently in the ninth grade and her grades are  doing poorly.   MENTAL STATUS EXAM:  The patient presents as a well developed, well  nourished, adolescent white female who is alert and oriented x 4,   psychomotor agitated, and whose appearance is compatible with her stated  age.  Speech is coherent, pressured, with flight of ideas and thought  racing.  Her affect and mood are anxious, irritable, labile.  Her  concentration is decreased.  She displays poor impulse control.  Her thought  processes are racing and somewhat disorganized.  She is psychomotor  agitated.  Immediate recall, short-term memory, and remote memory appear to  be grossly intact.   ADMISSION DIAGNOSES:   AXIS I:  1. Bipolar disorder, mixed type, severe without psychosis.  2. Generalized anxiety disorder.   AXIS II:  Rule out learning disorder, not otherwise specified.   AXIS III:  1. Gastroesophageal reflux disease.  2. Asthma.   AXIS IV:  Current psychosocial stressors are severe.   AXIS V:  20   ASSETS AND STRENGTHS:  Her parents are very supportive of her.   INITIAL PLAN OF CARE:  Initial plan of care is to discontinue Strattera,  continue the patient on Risperdal and Gabitril, increase the patient's  Lamictal to a therapeutic dose and evaluate the continued need  for Seroquel  in this patient.  Psychotherapy will focus on improving the patient's  impulse control, reality testing, decreasing potential for self-harm to self  and others.  A laboratory workup will also be initiated to rule out any  other medical problems contributing to her symptomatology.   ESTIMATED LENGTH OF STAY:  The estimated length of stay for the patient on  the inpatient unit is five to seven days.   POST HOSPITAL CARE PLAN:  Initial discharge plan is to discharge the patient  to home.                                               Cindie Crumbly, M.D.    TS/MEDQ  D:  11/28/2002  T:  11/28/2002  Job:  [id number]

## 2011-03-31 NOTE — H&P (Signed)
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 Carolanne Grumbling for inpatient psychiatric stabilization and treatment of  suicidal ideation and depression. The patient has a long history of bipolar  disorder, currently presenting as depressed though with parents concerned  about usual dissociative-type symptoms while also having history of  neurotoxic side effects from several medicines. The patient is  as well  having family and developmental stressors that may contribute to what she  considers as stress-induced decompensation.   HISTORY OF PRESENT ILLNESS:  The patient is under the outpatient psychiatric  care of Dr. Carolanne Grumbling. She sees Dr. Jolene Provost for psychotherapy. The  patient has multiple previous hospitalizations. She was apparently inpatient  in [facility] in the year 2000. She was in the behavioral health center  for mixed mania January 15 through December 04, 2002. She was admitted again  with manic psychosis December 22, 2002 through December 29, 2002. However,  the family has been quite pleased with the patient since that last  hospitalization beginning to stabilize more mature behavior and more  cooperative problem solving with the family while the patient's younger  sister has decompensated into what the family considers bipolar disorder  similar to father and patient. The patient has been on 2 antipsychotics at  least since 2004, Risperdal and Abilify, as well as being on Depakote. She  had been on Celexa in  2004 but has not been on any antidepressant since. She  has been diagnosed as having ADHD early in the course of symptom development  in early latency and subsequently now restarted on Dexedrine because she  could not complete her current academic level of math without the  facilitation of attention span. She usually does not take her afternoon  Dexedrine but, otherwise, is pretty reliable with most for medication.   ADMISSION MEDICATIONS:  At the time of admission she is on the following  medications.  1.  Dexedrine 20 milligrams SR spansule every morning and 10 milligrams      regular tablet at 1500, though she does not take the 1500 dose.  2.  Risperdal 0.5 milligrams b.i.d. and 1 milligram q.h.s. having recently      increased the dose and is also using some p.r.n. 0.5 or 1 milligrams      sublingual tablets for rage.  3.  Abilify 20 milligrams every bedtime.  4.  Depakote 1500 milligrams ER every bedtime.  5.  Melatonin 600 milligrams every bedtime.  6.  Vita supplement.  7.  Nexium 40 milligrams b.i.d.  8.  Singulair 10 milligrams every bedtime.  9.  Albuterol inhaler as needed.   The parents seem to describe over the last several months the patient  developing more rage, irritability and moodiness. The patient reports  depression at the time of admission and suicidal ideation, though her mood  seems to quickly become social and confident in efficacy so that it is  difficult for the patient to discern where her mood is going next. The  patient does not describe sustained mania or sustained major depression,  though she has been predominately depressed over the last several weeks.  When she is enraged, she hits, kicks and spits on the family. She has  thoughts of killing herself. She was teased in elementary school and seems  to be hypersensitive to comments or reactions of others. She has atypical  dysphoria and hysteroid dysphoria. Parents note that now she is having  symptoms  of watching herself become angry or disrespectfully unsuccessful in  her activities. The patient is having no hallucinations at this time, and  parents can agree that she does not manifest the psychosis she did 2 years  ago. They suggests that she has little social life and is usually at home.  However, she is currently being disciplined by the parents and expected to  stay home over the last week. She is preparing to attend school prom in 1  week and seems to feel desperate about going as well as desperate to go.  She, therefore, has social stressors that may be somewhat overwhelming  currently. The patient is not emotionally minded relative to talking out and  understanding in an approach to problem solving for these current symptoms.  However, she does manifest an interest and facilitating solutions with  others and currently manifests the need for the containment and  communication of the hospital environment to do so. She had no known organic  central nervous system trauma. There is a strong family history of bipolar  disorder.   PAST MEDICAL HISTORY:  The patient is under the primary care of Dr. Particia Jasper. She has a history of gastroesophageal reflux disorder and allergic  rhinitis and asthma. She has had surgery on her tongue in the past,  apparently lysis of the frenulum. She has had surgery at age 65 for a  rightward deviation of the nasal septum with a septoplasty and turbinate  reduction bilaterally. She tolerated general anesthesia well then. She has  had extraction of her third molars as well 2 years ago and tolerated that  surgery. She had menarche at age 10 and is currently menstruating. She is  not sexually active. Her menses are irregular. She had chickenpox in the  past. She wears contact lenses. The patient has not had a true delirium that  I can distinguish. She has had no seizure or syncope. She has had no heart  murmur or arrhythmia.  DRUG ALLERGIES:  She is  allergic to [facility] which causes manic symptoms,  BENADRYL causes hallucinations, WELLBUTRIN causes depression, LANOLIN,  ANTIBIOTICS and ANTIDEPRESSANTS as well as DOGS.  However, in describing  these reported allergies, she seems to be mostly describing some neurotoxic  symptoms, but largely situation failure of the efficacy of the medicine and  over-determined side effects.   REVIEW OF SYSTEMS:  The patient denies difficulty with gait, gaze or  continence. She denies exposure to communicable disease or toxins. She  denies rash, jaundice or purpura. There is no chest pain, palpitations or  presyncope. There is no abdominal pain, nausea, vomiting or diarrhea. There  is no dysuria or arthralgia.   IMMUNIZATIONS:  Up-to-date.   FAMILY HISTORY:  The patient  lives with both parents and 66 year old sister.  Father and 16 year old sister have bipolar disorder as did paternal  grandmother. Mother has had major depression. Mother is an Charity fundraiser and father is  a Teacher, early years/pre. Maternal great-grandfather and great-grandmother had substance  abuse with alcohol. Family has been stressed by the chaotic behavior of the  patient and/or her younger sister though over the last couple of years the  younger sister has been much more symptomatic and the patient improved.   SOCIAL AND DEVELOPMENTAL HISTORY:  The patient is eleventh grade student at  [employer]. She is worried about the prom and  privileges. She is  not sexually active. She uses no alcohol or illicit drugs. She is not  employed currently.   ASSETS:  Patient is social but somewhat concrete and mechanical in her  social style   MENTAL STATUS EXAM:  Height is 60 inches and weight is 129-1/2 pounds. Blood  pressure is 131/88 with heart rate of 117 sitting and 121/97 with heart rate  of 128 standing. The patient is right-handed. Neurological exam is intact.  She is alert and oriented with speech intact though she has slight  diminished  prosody. The patient has intact muscle strength and tone and  alternating motion rates are intact. She has no abnormal involuntary  movements. There is no pathologic reflexes or soft neurologic findings. Gait  and gaze are intact. She has somewhat of a wide-eyed somewhat abulic posture  of the facies though this seems to be a way of emphasizing that she expects  discharge soon and is here just to improve but to not give other a chance to  analyze or criticize her. She seems and states that she is stressed and can  formulate targets for trigger resolution and containment of rage. She is  slow to self-direct ego differentiation and identifications so that she  lacks discerning boundaries and containment of herself. She has no manic or  overt psychotic symptoms at this time. However, she is somewhat cognitively  limited for social problem solving by her mechanical style. Thusly parents  are concerned that the reduction of Abilify from 25-20 milligrams nightly has been part of the cause. They have wanted Risperdal increased though she  may exhibit some parkinsonian abulic features from the increased dose.  However, it seems important to stabilize the primary bipolar disorder to  accurately assess the significance of these possible side effects. The  patient has disorganization. She has suicide ideation. She has dissociative  processing.   IMPRESSION:   AXIS I:  1.  Bipolar disorder, depressed, severe.  2.  Attention deficit hyperactivity disorder, combined type, moderate      severity.  3.  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  4.  Parent-child problem.  5.  Other specified family circumstances.  6.  Other interpersonal problem.   AXIS II:  Diagnosis deferred.   AXIS III:  1.  Gastroesophageal reflux disorder.  2.  Allergic rhinitis and asthma.  3.  Irregular menses.   AXIS IV:  Stressors family severe acute and chronic; phase of  life severe  acute and chronic.   AXIS V:  Global assessment of function 38 with highest in last year 73.   PLAN:  The patient is admitted for inpatient adolescent psychiatric and  multidisciplinary multimodal behavioral  health treatment in a team-based  programmatic locked psychiatric unit. We will increase Abilify but Depakote  level is 119  and will not be increased at this time. Abilify will be made 25  milligrams at night. We will monitor Risperdal and Dexedrine effects,  particularly in the afternoon. Cognitive behavioral therapy, anger  management, habit reversal, reintegration, family therapy and individuation  separation therapies can be addressed during the hospital stay. Estimated  length of stay is 5-7 days with target symptoms for discharge being  stabilization of suicide risk and mood, stabilization of learning strategies  and interpersonal efficacy and generalization of the capacity for safe  effective participation in outpatient treatment.      GEJ/MEDQ  D:  03/21/2005  T:  03/21/2005  Job:  [id number]

## 2011-04-11 ENCOUNTER — Ambulatory Visit: Payer: Self-pay | Admitting: Psychologist

## 2011-04-25 ENCOUNTER — Encounter (HOSPITAL_COMMUNITY): Payer: Commercial Managed Care - PPO | Admitting: Psychiatry

## 2011-04-25 ENCOUNTER — Encounter (INDEPENDENT_AMBULATORY_CARE_PROVIDER_SITE_OTHER): Payer: 59 | Admitting: Psychiatry

## 2011-04-25 DIAGNOSIS — F319 Bipolar disorder, unspecified: Secondary | ICD-10-CM

## 2011-04-25 DIAGNOSIS — F909 Attention-deficit hyperactivity disorder, unspecified type: Secondary | ICD-10-CM

## 2011-05-02 ENCOUNTER — Encounter (HOSPITAL_COMMUNITY): Payer: Self-pay | Admitting: Psychiatry

## 2011-05-08 ENCOUNTER — Inpatient Hospital Stay (INDEPENDENT_AMBULATORY_CARE_PROVIDER_SITE_OTHER)
Admission: RE | Admit: 2011-05-08 | Discharge: 2011-05-08 | Disposition: A | Discharge status: Home / Self Care | Payer: Medicaid Other | Source: Ambulatory Visit | Attending: Family Medicine | Admitting: Family Medicine

## 2011-05-08 DIAGNOSIS — J019 Acute sinusitis, unspecified: Secondary | ICD-10-CM

## 2011-05-09 ENCOUNTER — Ambulatory Visit: Payer: Commercial Managed Care - PPO | Admitting: Psychologist

## 2011-05-23 ENCOUNTER — Ambulatory Visit: Payer: Medicaid Other | Admitting: Psychologist

## 2011-05-23 DIAGNOSIS — F3110 Bipolar disorder, current episode manic without psychotic features, unspecified: Secondary | ICD-10-CM

## 2011-06-06 ENCOUNTER — Ambulatory Visit: Payer: Medicaid Other | Admitting: Psychologist

## 2011-06-06 DIAGNOSIS — F3110 Bipolar disorder, current episode manic without psychotic features, unspecified: Secondary | ICD-10-CM

## 2011-06-20 ENCOUNTER — Ambulatory Visit: Payer: 59 | Admitting: Psychologist

## 2011-06-20 DIAGNOSIS — F3110 Bipolar disorder, current episode manic without psychotic features, unspecified: Secondary | ICD-10-CM

## 2011-07-03 ENCOUNTER — Encounter (INDEPENDENT_AMBULATORY_CARE_PROVIDER_SITE_OTHER): Payer: 59 | Admitting: Psychiatry

## 2011-07-03 DIAGNOSIS — F319 Bipolar disorder, unspecified: Secondary | ICD-10-CM

## 2011-07-03 DIAGNOSIS — F909 Attention-deficit hyperactivity disorder, unspecified type: Secondary | ICD-10-CM

## 2011-07-04 ENCOUNTER — Other Ambulatory Visit: Payer: Self-pay | Admitting: Family Medicine

## 2011-07-04 ENCOUNTER — Other Ambulatory Visit (HOSPITAL_COMMUNITY)
Admission: RE | Admit: 2011-07-04 | Discharge: 2011-07-04 | Disposition: A | Discharge status: Home / Self Care | Payer: 59 | Source: Ambulatory Visit | Attending: Family Medicine | Admitting: Family Medicine

## 2011-07-04 ENCOUNTER — Ambulatory Visit: Payer: 59 | Admitting: Psychologist

## 2011-07-04 DIAGNOSIS — F3110 Bipolar disorder, current episode manic without psychotic features, unspecified: Secondary | ICD-10-CM

## 2011-07-04 DIAGNOSIS — Z124 Encounter for screening for malignant neoplasm of cervix: Secondary | ICD-10-CM | POA: Insufficient documentation

## 2011-07-04 DIAGNOSIS — Z1159 Encounter for screening for other viral diseases: Secondary | ICD-10-CM | POA: Insufficient documentation

## 2011-07-11 ENCOUNTER — Ambulatory Visit: Payer: 59 | Admitting: Psychologist

## 2011-07-11 DIAGNOSIS — F3110 Bipolar disorder, current episode manic without psychotic features, unspecified: Secondary | ICD-10-CM

## 2011-07-18 ENCOUNTER — Ambulatory Visit: Payer: 59 | Admitting: Psychologist

## 2011-07-18 DIAGNOSIS — F3110 Bipolar disorder, current episode manic without psychotic features, unspecified: Secondary | ICD-10-CM

## 2011-08-01 ENCOUNTER — Ambulatory Visit: Payer: Commercial Managed Care - PPO | Admitting: Psychologist

## 2011-08-15 ENCOUNTER — Ambulatory Visit: Payer: 59 | Admitting: Psychologist

## 2011-08-15 DIAGNOSIS — F3110 Bipolar disorder, current episode manic without psychotic features, unspecified: Secondary | ICD-10-CM

## 2011-08-16 LAB — POCT I-STAT, CHEM 8
BUN: 11
Calcium, Ion: 1.14
Chloride: 105
Creatinine, Ser: 0.9
Glucose, Bld: 91

## 2011-08-16 LAB — URINALYSIS, ROUTINE W REFLEX MICROSCOPIC
Bilirubin Urine: NEGATIVE
Ketones, ur: NEGATIVE
Nitrite: NEGATIVE
Protein, ur: NEGATIVE
Specific Gravity, Urine: 1.017
Urobilinogen, UA: 0.2

## 2011-08-16 LAB — DIFFERENTIAL
Basophils Absolute: 0.1
Basophils Relative: 1
Eosinophils Absolute: 0.2
Monocytes Absolute: 0.5
Neutro Abs: 4.7

## 2011-08-16 LAB — HEPATIC FUNCTION PANEL
ALT: 14
AST: 15
Alkaline Phosphatase: 62
Bilirubin, Direct: <0.1
Total Bilirubin: 0.6

## 2011-08-16 LAB — CBC
Hemoglobin: 13.9
MCHC: 33.6
RDW: 13.1

## 2011-08-16 LAB — URINE MICROSCOPIC-ADD ON

## 2011-08-29 ENCOUNTER — Ambulatory Visit: Payer: 59 | Admitting: Psychologist

## 2011-08-29 ENCOUNTER — Encounter (HOSPITAL_COMMUNITY): Payer: Self-pay

## 2011-08-29 ENCOUNTER — Ambulatory Visit: Payer: Commercial Managed Care - PPO | Admitting: Psychologist

## 2011-08-29 DIAGNOSIS — F3110 Bipolar disorder, current episode manic without psychotic features, unspecified: Secondary | ICD-10-CM

## 2011-09-12 ENCOUNTER — Ambulatory Visit: Payer: 59 | Admitting: Psychologist

## 2011-09-12 DIAGNOSIS — F3110 Bipolar disorder, current episode manic without psychotic features, unspecified: Secondary | ICD-10-CM

## 2011-09-26 ENCOUNTER — Ambulatory Visit: Payer: 59 | Admitting: Psychologist

## 2011-09-26 DIAGNOSIS — F3110 Bipolar disorder, current episode manic without psychotic features, unspecified: Secondary | ICD-10-CM

## 2011-10-02 ENCOUNTER — Ambulatory Visit (INDEPENDENT_AMBULATORY_CARE_PROVIDER_SITE_OTHER): Payer: 59 | Admitting: Psychiatry

## 2011-10-02 ENCOUNTER — Encounter (HOSPITAL_COMMUNITY): Payer: Self-pay | Admitting: Psychiatry

## 2011-10-02 VITALS — BP 104/62 | Ht 62.5 in | Wt 168.0 lb

## 2011-10-02 DIAGNOSIS — F3160 Bipolar disorder, current episode mixed, unspecified: Secondary | ICD-10-CM

## 2011-10-02 DIAGNOSIS — F900 Attention-deficit hyperactivity disorder, predominantly inattentive type: Secondary | ICD-10-CM

## 2011-10-02 DIAGNOSIS — F988 Other specified behavioral and emotional disorders with onset usually occurring in childhood and adolescence: Secondary | ICD-10-CM

## 2011-10-02 NOTE — Progress Notes (Signed)
   [facility] Follow-up Outpatient Visit  Rachel [name] 01/18/1986   Subjective: The patient is a 25 year old female who has been followed by [facility] since October 2002. I have been treating her since August 2008. Patient is currently diagnosed with bipolar disorder along with ADHD combined type by history. She currently takes Lamictal 200 mg twice a day along with Abilify 20 mg daily. Her Lamictal as required to be brand name. The patient has recently moved back home. She is much happier there. She is not working at. She's not volunteering at. Her appetite has gone up. She has gained 20 pounds since her last appointment. She states that she's trying to exercise more to help with this. She and her boyfriend are going to get a dog after today's appointment. The patient endorses less irritability. Her mother has taken in to homeless teenagers. This has added some disruption to the household. The patient endorses good sleep. She denies any mood swings.  Filed Vitals:   10/02/11 1000  BP: 104/62    Mental Status Examination  Appearance: Casually dressed Alert: Yes Attention: good  Cooperative: Yes Eye Contact: Good Speech: Regular rate rhythm and volume Psychomotor Activity: Normal Memory/Concentration: Intact to recent and remote Oriented: person, place, time/date and situation Mood: Euthymic Affect: Full Range Thought Processes and Associations: Logical Fund of Knowledge: Fair Thought Content: No suicidal or homicidal thoughts Insight: Fair Judgement: Fair  Diagnosis: Bipolar disorder, ADHD by history  Treatment Plan: At this point we'll continue the patient on her current medications. She seems to be doing well on them. Patient to call with concerns. I will see her back in 3 months.  Jamse Mead, MD

## 2011-10-03 ENCOUNTER — Ambulatory Visit: Payer: 59 | Admitting: Psychologist

## 2011-10-17 ENCOUNTER — Other Ambulatory Visit (HOSPITAL_COMMUNITY): Payer: Self-pay | Admitting: Psychiatry

## 2011-10-17 DIAGNOSIS — F319 Bipolar disorder, unspecified: Secondary | ICD-10-CM

## 2011-10-17 MED ORDER — ARIPIPRAZOLE 20 MG PO TABS: 20.0000 mg | ORAL_TABLET | Freq: Every day | ORAL | Status: DC

## 2011-10-24 ENCOUNTER — Ambulatory Visit: Payer: 59 | Admitting: Psychologist

## 2011-10-24 DIAGNOSIS — F3110 Bipolar disorder, current episode manic without psychotic features, unspecified: Secondary | ICD-10-CM

## 2011-10-26 ENCOUNTER — Ambulatory Visit: Payer: 59 | Admitting: Psychologist

## 2011-11-21 ENCOUNTER — Ambulatory Visit: Payer: 59 | Admitting: Psychologist

## 2011-12-05 ENCOUNTER — Ambulatory Visit: Payer: 59 | Admitting: Psychologist

## 2011-12-05 DIAGNOSIS — F3110 Bipolar disorder, current episode manic without psychotic features, unspecified: Secondary | ICD-10-CM

## 2011-12-19 ENCOUNTER — Ambulatory Visit: Payer: 59 | Admitting: Psychologist

## 2011-12-21 ENCOUNTER — Ambulatory Visit: Payer: 59 | Admitting: Psychologist

## 2011-12-21 DIAGNOSIS — F3110 Bipolar disorder, current episode manic without psychotic features, unspecified: Secondary | ICD-10-CM

## 2012-01-02 ENCOUNTER — Ambulatory Visit: Payer: 59 | Admitting: Psychologist

## 2012-01-16 ENCOUNTER — Ambulatory Visit: Payer: 59 | Admitting: Psychologist

## 2012-01-23 ENCOUNTER — Ambulatory Visit: Payer: 59 | Admitting: Psychologist

## 2012-01-23 DIAGNOSIS — F3110 Bipolar disorder, current episode manic without psychotic features, unspecified: Secondary | ICD-10-CM

## 2012-01-30 ENCOUNTER — Ambulatory Visit: Payer: 59 | Admitting: Psychologist

## 2012-02-06 ENCOUNTER — Ambulatory Visit: Payer: 59 | Admitting: Psychologist

## 2012-02-06 DIAGNOSIS — F3110 Bipolar disorder, current episode manic without psychotic features, unspecified: Secondary | ICD-10-CM

## 2012-02-13 ENCOUNTER — Other Ambulatory Visit: Payer: Self-pay | Admitting: Dermatology

## 2012-02-13 ENCOUNTER — Ambulatory Visit: Payer: 59 | Admitting: Psychologist

## 2012-02-23 ENCOUNTER — Ambulatory Visit: Payer: 59 | Admitting: Psychologist

## 2012-02-23 DIAGNOSIS — F3110 Bipolar disorder, current episode manic without psychotic features, unspecified: Secondary | ICD-10-CM

## 2012-03-06 ENCOUNTER — Ambulatory Visit: Payer: 59 | Admitting: Psychologist

## 2012-03-12 ENCOUNTER — Ambulatory Visit: Payer: 59 | Admitting: Psychologist

## 2012-03-12 DIAGNOSIS — F3110 Bipolar disorder, current episode manic without psychotic features, unspecified: Secondary | ICD-10-CM

## 2012-04-03 ENCOUNTER — Institutional Professional Consult (permissible substitution): Payer: 59 | Admitting: Psychologist

## 2012-04-03 DIAGNOSIS — F3110 Bipolar disorder, current episode manic without psychotic features, unspecified: Secondary | ICD-10-CM

## 2012-04-15 ENCOUNTER — Other Ambulatory Visit (HOSPITAL_COMMUNITY): Payer: Self-pay | Admitting: Psychiatry

## 2012-04-15 DIAGNOSIS — F319 Bipolar disorder, unspecified: Secondary | ICD-10-CM

## 2012-04-15 MED ORDER — ARIPIPRAZOLE 20 MG PO TABS: 20.0000 mg | ORAL_TABLET | Freq: Every day | ORAL | Status: DC

## 2012-05-07 ENCOUNTER — Ambulatory Visit (INDEPENDENT_AMBULATORY_CARE_PROVIDER_SITE_OTHER): Payer: 59 | Admitting: Psychiatry

## 2012-05-07 ENCOUNTER — Encounter (HOSPITAL_COMMUNITY): Payer: Self-pay | Admitting: Psychiatry

## 2012-05-07 VITALS — BP 106/62 | Ht 62.5 in | Wt 153.0 lb

## 2012-05-07 DIAGNOSIS — F909 Attention-deficit hyperactivity disorder, unspecified type: Secondary | ICD-10-CM | POA: Insufficient documentation

## 2012-05-07 DIAGNOSIS — F319 Bipolar disorder, unspecified: Secondary | ICD-10-CM

## 2012-05-07 MED ORDER — LAMOTRIGINE 200 MG PO TABS: 200.0000 mg | ORAL_TABLET | Freq: Two times a day (BID) | ORAL | Status: DC

## 2012-05-07 NOTE — Progress Notes (Signed)
   [facility] Follow-up Outpatient Visit  FRONA YOST [birth year]   Subjective: The patient is a 26 year old female who has been followed by [facility] since October 2002. I have been treating her since August 2008. 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 She currently takes Lamictal 200 mg twice a day along with Abilify 20 mg daily. Her Lamictal is required to be brand name. At her last appointment, we did not make any changes. The patient had recently moved back home and was adjusting to the change. She presents today with her dad and sister. She seen by herself. She is working on losing weight. She is down 15 pounds since last visit. The homeless children that her mother took in are now gone. The girl ended up stealing from them. Patient feels that she was stressed eating with them living there. She was also having issues with constipation, but MiraLAX is helping. Her boyfriend is now Engineer, site at [employer]. The patient still continues to try to get a job. The patient complains of more moodiness and irritability. Her maternal grandfather died in 2022/12/12. She's picking up on the vibe from her mother. Her mother is having a very difficult time with this. She reports that she is sleeping okay. Sometimes she has anxiety at bedtime. She is working on exercising. She feels her mood is pretty well controlled with the medication considering what she's been going through.  Filed Vitals:   05/07/12 1340  BP: 106/62    Mental Status Examination  Appearance: Casually dressed Alert: Yes Attention: good  Cooperative: Yes Eye Contact: Good Speech: Regular rate rhythm and volume Psychomotor Activity: Normal Memory/Concentration: Intact to recent and remote Oriented: person, place, time/date and situation Mood: Euthymic Affect: Full Range Thought Processes and Associations: Logical Fund of Knowledge:  Fair Thought Content: No suicidal or homicidal thoughts Insight: Fair Judgement: Fair  Diagnosis: Bipolar disorder, ADHD by history  Treatment Plan: At this point we'll continue the patient on her current medications. She seems to be doing well on them. Patient to call with concerns. I will see her back in 6 months.  Jamse Mead, MD

## 2012-06-21 ENCOUNTER — Other Ambulatory Visit (HOSPITAL_COMMUNITY): Payer: Self-pay | Admitting: Psychiatry

## 2012-06-21 DIAGNOSIS — F319 Bipolar disorder, unspecified: Secondary | ICD-10-CM

## 2012-06-21 MED ORDER — ARIPIPRAZOLE 20 MG PO TABS: 20.0000 mg | ORAL_TABLET | Freq: Every day | ORAL | Status: DC

## 2012-10-18 ENCOUNTER — Ambulatory Visit (HOSPITAL_COMMUNITY): Payer: 59 | Admitting: Psychiatry

## 2012-10-30 ENCOUNTER — Ambulatory Visit (INDEPENDENT_AMBULATORY_CARE_PROVIDER_SITE_OTHER): Payer: Medicaid Other | Admitting: Psychiatry

## 2012-10-30 VITALS — BP 106/62 | Ht 62.5 in | Wt 153.0 lb

## 2012-10-30 DIAGNOSIS — F319 Bipolar disorder, unspecified: Secondary | ICD-10-CM

## 2012-10-30 MED ORDER — LAMOTRIGINE 200 MG PO TABS: 200.0000 mg | ORAL_TABLET | Freq: Two times a day (BID) | ORAL | Status: DC

## 2012-10-30 MED ORDER — ARIPIPRAZOLE 20 MG PO TABS: 20.0000 mg | ORAL_TABLET | Freq: Every day | ORAL | Status: DC

## 2012-10-31 ENCOUNTER — Encounter (HOSPITAL_COMMUNITY): Payer: Self-pay | Admitting: Psychiatry

## 2012-10-31 NOTE — Progress Notes (Signed)
   [facility] Follow-up Outpatient Visit  Rachel [name] May 14, 1986   Subjective: The patient is a 26 year old female who has been followed by [facility] since October 2002. I have been treating her since August 2008. 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 She currently takes Lamictal 200 mg twice a day along with Abilify 20 mg daily. Her Lamictal is required to be brand name. At her last appointment, I did not make any changes. The patient continues to live with her parents. There've been issues with her sister and her dad. Mom reportedly stated the patient is more stable one in the house. The patient was shocked your mom say this. The patient maintained her 15 pound weight loss. She reports she's been working out and walking a lot. She is still not able to find a job. She is happy currently where her life is. Her boyfriend continues to work at [employer] and is doing well. She is hoping he will proposed soon. The patient endorses good sleep and appetite. Mood has been stable.  Filed Vitals:   10/31/12 1047  BP: 106/62    Mental Status Examination  Appearance: Casually dressed Alert: Yes Attention: good  Cooperative: Yes Eye Contact: Good Speech: Regular rate rhythm and volume Psychomotor Activity: Normal Memory/Concentration: Intact to recent and remote Oriented: person, place, time/date and situation Mood: Euthymic Affect: Full Range Thought Processes and Associations: Logical Fund of Knowledge: Fair Thought Content: No suicidal or homicidal thoughts Insight: Fair Judgement: Fair  Diagnosis: Bipolar disorder, ADHD by history  Treatment Plan: At this point we'll continue the patient on her current medications. She seems to be doing well on them. Patient to call with concerns. I will see her back in 3 months.  Jamse Mead, MD

## 2012-11-01 ENCOUNTER — Telehealth (HOSPITAL_COMMUNITY): Payer: Self-pay | Admitting: Psychiatry

## 2012-11-01 DIAGNOSIS — F319 Bipolar disorder, unspecified: Secondary | ICD-10-CM

## 2012-11-01 MED ORDER — LAMOTRIGINE 200 MG PO TABS: 200.0000 mg | ORAL_TABLET | Freq: Two times a day (BID) | ORAL | Status: DC

## 2012-11-01 NOTE — Telephone Encounter (Signed)
Pharmacy faxed notice stating that "Brand Name Medically Necessary must be written and faxed to them.   -will fax Lamictal with the same.

## 2012-11-04 ENCOUNTER — Telehealth (HOSPITAL_COMMUNITY): Payer: Self-pay | Admitting: Psychiatry

## 2012-11-04 NOTE — Telephone Encounter (Signed)
Called pharmacy, they have requested handwritten prescription for Lamotrignine, with "Brand name medically necessary on it for [facility] auditors.   PLAN: Patient has a 30 day prescription on file but will require a handwritten prescription for her next refill. [name] Kitchen

## 2012-11-08 NOTE — Telephone Encounter (Signed)
Completed and faxed.

## 2013-01-21 ENCOUNTER — Ambulatory Visit (HOSPITAL_COMMUNITY): Payer: Self-pay | Admitting: Psychiatry

## 2013-01-27 ENCOUNTER — Ambulatory Visit (HOSPITAL_COMMUNITY): Payer: Self-pay | Admitting: Psychiatry

## 2013-01-30 ENCOUNTER — Ambulatory Visit (INDEPENDENT_AMBULATORY_CARE_PROVIDER_SITE_OTHER): Payer: 59 | Admitting: Psychiatry

## 2013-01-30 ENCOUNTER — Encounter (HOSPITAL_COMMUNITY): Payer: Self-pay | Admitting: Psychiatry

## 2013-01-30 VITALS — BP 122/82 | Ht 62.5 in | Wt 151.0 lb

## 2013-01-30 DIAGNOSIS — F319 Bipolar disorder, unspecified: Secondary | ICD-10-CM

## 2013-01-30 NOTE — Progress Notes (Signed)
   [facility] Health Follow-up Outpatient Visit  Rachel [name] 19-Sep-1986   Subjective: The patient is a 27 year old female who has been followed by [facility] since October 2002. I have been treating her since August 2008. 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 She currently takes Lamictal 200 mg twice a day along with Abilify 20 mg daily. Her Lamictal is required to be brand name. At her last appointment, I did not make any changes. The patient continues to live with her parents. Her boyfriend also lives there. She'll be starting a job at [employer] in the next few weeks. She's excited about this. She feels that she's doing well. Her grandmother has been around quite a bit. Her sister has issues with this, but the patient feels that she would rather have her there than not. The grandmother can be quite outspoken. They're planning a trip to [place]. The patient is hoping her boyfriend will propose on the trip. She endorses good sleep and appetite. She feels that her medications are working well. There've been no mood issues.  Filed Vitals:   01/30/13 1233  BP: 122/82    Mental Status Examination  Appearance: Casually dressed Alert: Yes Attention: good  Cooperative: Yes Eye Contact: Good Speech: Regular rate rhythm and volume Psychomotor Activity: Normal Memory/Concentration: Intact to recent and remote Oriented: person, place, time/date and situation Mood: Euthymic Affect: Full Range Thought Processes and Associations: Logical Fund of Knowledge: Fair Thought Content: No suicidal or homicidal thoughts Insight: Fair Judgement: Fair  Diagnosis: Bipolar disorder, ADHD by history  Treatment Plan: I will not make any changes today continue the Lamictal and Abilify. Patient to call with concerns. I will see her back in 2 months.  Jamse Mead, MD

## 2013-03-25 ENCOUNTER — Ambulatory Visit (INDEPENDENT_AMBULATORY_CARE_PROVIDER_SITE_OTHER): Payer: Federal, State, Local not specified - Other | Admitting: Psychiatry

## 2013-03-25 ENCOUNTER — Encounter (HOSPITAL_COMMUNITY): Payer: Self-pay | Admitting: Psychiatry

## 2013-03-25 VITALS — BP 100/64 | Ht 62.5 in | Wt 146.0 lb

## 2013-03-25 DIAGNOSIS — F319 Bipolar disorder, unspecified: Secondary | ICD-10-CM

## 2013-03-25 DIAGNOSIS — F909 Attention-deficit hyperactivity disorder, unspecified type: Secondary | ICD-10-CM

## 2013-03-25 NOTE — Progress Notes (Signed)
   [facility] Follow-up Outpatient Visit  Rachel [name] [birth year]   Subjective: The patient is a 27 year old female who has been followed by [facility] since October 2002. I have been treating her since August 2008. 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 She currently takes Lamictal 200 mg twice a day along with Abilify 20 mg daily. Her Lamictal is required to be brand name. At her last appointment, I did not make any changes. The patient started working at [employer]. She's happy regarding this. She's had a few issues with some of her peers. She had bronchitis last week, and missed some work. According to mom, she was more irritable during this time. She's feeling better now. She endorses good sleep and appetite. She feels her mood has been stable for the most part. She and her family are going to [place] next week. Gearldine Shown is now in [place], and mom is there taking care of her. There are no mood swings.  Filed Vitals:   03/25/13 1013  BP: 100/64   Active Ambulatory Problems    Diagnosis Date Noted  . ADHD (attention deficit hyperactivity disorder) 05/07/2012  . Bipolar 1 disorder 05/07/2012   Resolved Ambulatory Problems    Diagnosis Date Noted  . No Resolved Ambulatory Problems   Past Medical History  Diagnosis Date  . Depression   . Bipolar disorder    Current Outpatient Prescriptions on File Prior to Visit  Medication Sig Dispense Refill  . ARIPiprazole (ABILIFY) 20 MG tablet Take 1 tablet (20 mg total) by mouth daily.  30 tablet  5  . lamoTRIgine (LAMICTAL) 200 MG tablet Take 1 tablet (200 mg total) by mouth 2 (two) times daily. Brand name medically necessary.  60 tablet  5  . norgestimate-ethinyl estradiol (ORTHO-CYCLEN,SPRINTEC,PREVIFEM) 0.25-35 MG-MCG tablet        No current facility-administered medications on file prior to visit.   Review of Systems - General  ROS: negative for - sleep disturbance or weight gain Psychological ROS: positive for - irritability Cardiovascular ROS: no chest pain or dyspnea on exertion Musculoskeletal ROS: negative for - gait disturbance or muscular weakness Neurological ROS: negative for - dizziness, headaches or seizures   Mental Status Examination  Appearance: Casually dressed Alert: Yes Attention: good  Cooperative: Yes Eye Contact: Good Speech: Regular rate rhythm and volume Psychomotor Activity: Normal Memory/Concentration: Intact to recent and remote Oriented: person, place, time/date and situation Mood: Euthymic Affect: Full Range Thought Processes and Associations: Logical Fund of Knowledge: Fair Thought Content: No suicidal or homicidal thoughts Insight: Fair Judgement: Fair  Diagnosis: Bipolar disorder, ADHD by history  Treatment Plan: I will not make any changes today and continue the Lamictal and Abilify. Patient to call with concerns. I will see her back in 3 months.  Jamse Mead, MD

## 2013-04-11 ENCOUNTER — Other Ambulatory Visit: Payer: Self-pay | Admitting: Occupational Medicine

## 2013-04-11 ENCOUNTER — Ambulatory Visit: Payer: Self-pay

## 2013-04-11 DIAGNOSIS — R52 Pain, unspecified: Secondary | ICD-10-CM

## 2013-06-24 ENCOUNTER — Encounter (HOSPITAL_COMMUNITY): Payer: Self-pay | Admitting: Psychiatry

## 2013-06-24 ENCOUNTER — Ambulatory Visit (INDEPENDENT_AMBULATORY_CARE_PROVIDER_SITE_OTHER): Payer: 59 | Admitting: Psychiatry

## 2013-06-24 VITALS — BP 124/62 | Ht 62.5 in | Wt 150.0 lb

## 2013-06-24 DIAGNOSIS — F319 Bipolar disorder, unspecified: Secondary | ICD-10-CM

## 2013-06-24 NOTE — Progress Notes (Signed)
   [facility] Health Follow-up Outpatient Visit  Rachel [name] 07-27-1986   Subjective: The patient is a 27 year old female who has been followed by [facility] since October 2002. I have been treating her since August 2008. 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 She currently takes Lamictal 200 mg twice a day along with Abilify 20 mg daily. Her Lamictal is required to be brand name. At her last appointment, I did not make any changes. Since her last appointment, the patient has since become engaged. He proposed during their trip to [place]. The patient is very excited. She has set a wedding date for 05/09/2014. She does continue to work. She is working approximately 20 hours a week. The patient is currently on Workmen's Comp. She needs physical therapy for her shoulder from a work-related injury. 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 The patient's grandmother has been visiting. She is leaving today. The patient feels like she is doing very well. She endorses good sleep and appetite. Mood has been stable. She states there is a lot of arguing in the household but this is more attributable to her younger sister.  Filed Vitals:   06/24/13 1044  BP: 124/62   Active Ambulatory Problems    Diagnosis Date Noted  . ADHD (attention deficit hyperactivity disorder) 05/07/2012  . Bipolar 1 disorder 05/07/2012   Resolved Ambulatory Problems    Diagnosis Date Noted  . No Resolved Ambulatory Problems   Past Medical History  Diagnosis Date  . Depression   . Bipolar disorder    Current Outpatient Prescriptions on File Prior to Visit  Medication Sig Dispense Refill  . ARIPiprazole (ABILIFY) 20 MG tablet Take 1 tablet (20 mg total) by mouth daily.  30 tablet  5  . lamoTRIgine (LAMICTAL) 200 MG tablet Take 1 tablet (200 mg total) by mouth 2 (two) times daily. Brand name medically necessary.  60 tablet  5  .  norgestimate-ethinyl estradiol (ORTHO-CYCLEN,SPRINTEC,PREVIFEM) 0.25-35 MG-MCG tablet        No current facility-administered medications on file prior to visit.   Review of Systems - General ROS: negative for - sleep disturbance or weight gain Psychological ROS: positive for - irritability Cardiovascular ROS: no chest pain or dyspnea on exertion Musculoskeletal ROS: negative for - gait disturbance or muscular weakness Neurological ROS: negative for - dizziness, headaches or seizures   Mental Status Examination  Appearance: Casually dressed Alert: Yes Attention: good  Cooperative: Yes Eye Contact: Good Speech: Regular rate rhythm and volume Psychomotor Activity: Normal Memory/Concentration: Intact to recent and remote Oriented: person, place, time/date and situation Mood: Euthymic Affect: Full Range Thought Processes and Associations: Logical Fund of Knowledge: Fair Thought Content: No suicidal or homicidal thoughts Insight: Fair Judgement: Fair  Diagnosis: Bipolar disorder, ADHD by history  Treatment Plan: I will not make any changes today and continue the Lamictal and Abilify. Patient to call with concerns. I will see her back in 2 months.  Jamse Mead, MD

## 2013-09-02 ENCOUNTER — Encounter (HOSPITAL_COMMUNITY): Payer: Self-pay | Admitting: Psychiatry

## 2013-09-02 ENCOUNTER — Encounter (INDEPENDENT_AMBULATORY_CARE_PROVIDER_SITE_OTHER): Payer: Self-pay

## 2013-09-02 ENCOUNTER — Ambulatory Visit (INDEPENDENT_AMBULATORY_CARE_PROVIDER_SITE_OTHER): Payer: 59 | Admitting: Psychiatry

## 2013-09-02 VITALS — BP 120/80 | Ht 62.5 in | Wt 149.0 lb

## 2013-09-02 DIAGNOSIS — F319 Bipolar disorder, unspecified: Secondary | ICD-10-CM

## 2013-09-02 MED ORDER — LAMOTRIGINE 200 MG PO TABS: 200.0000 mg | ORAL_TABLET | Freq: Two times a day (BID) | ORAL | Status: DC

## 2013-09-02 MED ORDER — ARIPIPRAZOLE 20 MG PO TABS: 20.0000 mg | ORAL_TABLET | Freq: Every day | ORAL | Status: DC

## 2013-09-02 NOTE — Progress Notes (Signed)
   [facility] Follow-up Outpatient Visit  Rachel [name] 1986-02-27   Subjective: The patient is a 27 year old female who has been followed by [facility] since October 2002. I have been treating her since August 2008. 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 She currently takes Lamictal 200 mg twice a day along with Abilify 20 mg daily. Her Lamictal is required to be brand name. At her last appointment, I did not make any changes. The patient presents today with mother. She continues to work at [employer]. She enjoys it, and like the responsibility. She does well with her coworkers. The patient has bought her wedding dress. She has her location pick up for the wedding. She is very excited about this. She has been walking more to try to get in shape. The patient did have a few sleepless nights a few weeks ago. She felt she was slightly hypomanic. Since that time, she has been much better. Appetite is good. She is down 1 pound today. She denies any mood issues or mood lability. Mom feels that she's doing well.  Filed Vitals:   09/02/13 1054  BP: 120/80   Active Ambulatory Problems    Diagnosis Date Noted  . ADHD (attention deficit hyperactivity disorder) 05/07/2012  . Bipolar 1 disorder 05/07/2012   Resolved Ambulatory Problems    Diagnosis Date Noted  . No Resolved Ambulatory Problems   Past Medical History  Diagnosis Date  . Depression   . Bipolar disorder    Current Outpatient Prescriptions on File Prior to Visit  Medication Sig Dispense Refill  . norgestimate-ethinyl estradiol (ORTHO-CYCLEN,SPRINTEC,PREVIFEM) 0.25-35 MG-MCG tablet        No current facility-administered medications on file prior to visit.   Review of Systems - General ROS: negative for - sleep disturbance or weight gain Psychological ROS: positive for - irritability Cardiovascular ROS: no chest pain or dyspnea on  exertion Musculoskeletal ROS: negative for - gait disturbance or muscular weakness Neurological ROS: negative for - dizziness, headaches or seizures   Mental Status Examination  Appearance: Casually dressed Alert: Yes Attention: good  Cooperative: Yes Eye Contact: Good Speech: Regular rate rhythm and volume Psychomotor Activity: Normal Memory/Concentration: Intact to recent and remote Oriented: person, place, time/date and situation Mood: Euthymic Affect: Full Range Thought Processes and Associations: Logical Fund of Knowledge: Fair Thought Content: No suicidal or homicidal thoughts Insight: Fair Judgement: Fair  Diagnosis: Bipolar disorder, ADHD by history  Treatment Plan: I will not make any changes today and continue the Lamictal brand name only 200 mg twice a day and Abilify 20 mg daily. Patient to call with concerns. She will return to clinic in 6 months.  Jamse Mead, MD

## 2013-09-29 ENCOUNTER — Other Ambulatory Visit: Payer: Self-pay | Admitting: Family Medicine

## 2013-09-29 ENCOUNTER — Other Ambulatory Visit (HOSPITAL_COMMUNITY)
Admission: RE | Admit: 2013-09-29 | Discharge: 2013-09-29 | Disposition: A | Discharge status: Home / Self Care | Payer: 59 | Source: Ambulatory Visit | Attending: Family Medicine | Admitting: Family Medicine

## 2013-09-29 DIAGNOSIS — Z124 Encounter for screening for malignant neoplasm of cervix: Secondary | ICD-10-CM | POA: Insufficient documentation

## 2013-09-29 DIAGNOSIS — Z113 Encounter for screening for infections with a predominantly sexual mode of transmission: Secondary | ICD-10-CM | POA: Insufficient documentation

## 2014-01-09 ENCOUNTER — Emergency Department (INDEPENDENT_AMBULATORY_CARE_PROVIDER_SITE_OTHER): Payer: 59

## 2014-01-09 ENCOUNTER — Encounter (HOSPITAL_COMMUNITY): Payer: Self-pay | Admitting: Emergency Medicine

## 2014-01-09 ENCOUNTER — Emergency Department (INDEPENDENT_AMBULATORY_CARE_PROVIDER_SITE_OTHER)
Admission: EM | Admit: 2014-01-09 | Discharge: 2014-01-09 | Disposition: A | Discharge status: Home / Self Care | Payer: 59 | Source: Home / Self Care | Attending: Family Medicine | Admitting: Family Medicine

## 2014-01-09 DIAGNOSIS — J189 Pneumonia, unspecified organism: Secondary | ICD-10-CM

## 2014-01-09 HISTORY — DX: Unspecified asthma, uncomplicated: J45.909

## 2014-01-09 LAB — POCT RAPID STREP A: STREPTOCOCCUS, GROUP A SCREEN (DIRECT): NEGATIVE

## 2014-01-09 MED ORDER — DEXAMETHASONE SODIUM PHOSPHATE 10 MG/ML IJ SOLN
10.0000 mg | Freq: Once | INTRAMUSCULAR | Status: AC
  Administered 2014-01-09: 10 mg via INTRAMUSCULAR

## 2014-01-09 MED ORDER — LEVOFLOXACIN 500 MG PO TABS: 500.0000 mg | ORAL_TABLET | Freq: Every day | ORAL | Status: DC

## 2014-01-09 MED ORDER — ALBUTEROL SULFATE (2.5 MG/3ML) 0.083% IN NEBU: 2.5000 mg | INHALATION_SOLUTION | RESPIRATORY_TRACT | Status: DC | PRN

## 2014-01-09 MED ORDER — DEXAMETHASONE SODIUM PHOSPHATE 10 MG/ML IJ SOLN
INTRAMUSCULAR | Status: AC
Start: 2014-01-09 — End: 2014-01-09
  Filled 2014-01-09: qty 1

## 2014-01-09 NOTE — ED Provider Notes (Signed)
Medical screening examination/treatment/procedure(s) were performed by non-physician practitioner and as supervising physician I was immediately available for consultation/collaboration.  Philipp Deputy, M.D.  Harden Mo, MD 01/09/14 [phone]

## 2014-01-09 NOTE — ED Provider Notes (Signed)
CSN: [id number]     Arrival date & time 01/09/14  1006 History   First MD Initiated Contact with Patient 01/09/14 1144     Chief Complaint  Patient presents with  . Sore Throat   (Consider location/radiation/quality/duration/timing/severity/associated sxs/prior Treatment) HPI Comments: 28 year old female with history of asthma, bipolar disorder presents complaining of cough and sore throat. She has also had a lot of wheezing and has been using her inhaler much more than usual. She had bronchitis 2 weeks ago, this got better until a couple of days ago when she started to get worse again. She has been using her inhaler approximately every 2 hours it helps, but the effect wears off after about an hour and a half. She also has pain in her her back with coughing and taking a deep breath. She has had one episode of post tussive vomiting. No recent travel or sick contacts.  Patient is a 28 y.o. female presenting with pharyngitis.  Sore Throat Associated symptoms include shortness of breath. Pertinent negatives include no chest pain and no abdominal pain.    Past Medical History  Diagnosis Date  . ADHD (attention deficit hyperactivity disorder)   . Depression   . Bipolar disorder   . Asthma    History reviewed. No pertinent past surgical history. Family History  Problem Relation Age of Onset  . Depression Mother   . Anxiety disorder Mother   . Depression Father   . Bipolar disorder Sister   . ADD / ADHD Sister    History  Substance Use Topics  . Smoking status: Never Smoker   . Smokeless tobacco: Not on file  . Alcohol Use: No   OB History   Grav Para Term Preterm Abortions TAB SAB Ect Mult Living                 Review of Systems  Constitutional: Negative for fever and chills.  HENT: Positive for congestion, postnasal drip, rhinorrhea and sore throat. Negative for ear pain and sinus pressure.   Eyes: Negative for visual disturbance.  Respiratory: Positive for cough, shortness  of breath and wheezing.   Cardiovascular: Negative for chest pain, palpitations and leg swelling.  Gastrointestinal: Negative for nausea, vomiting and abdominal pain.  Endocrine: Negative for polydipsia and polyuria.  Genitourinary: Negative for dysuria, urgency and frequency.  Musculoskeletal: Negative for arthralgias and myalgias.  Skin: Negative for rash.  Neurological: Negative for dizziness, weakness and light-headedness.    Allergies  Benadryl; Olanzapine; Penicillins; and Prozac  Home Medications   Current Outpatient Rx  Name  Route  Sig  Dispense  Refill  . albuterol (PROVENTIL) (2.5 MG/3ML) 0.083% nebulizer solution   Nebulization   Take 3 mLs (2.5 mg total) by nebulization every 4 (four) hours as needed for wheezing or shortness of breath.   75 mL   1   . ARIPiprazole (ABILIFY) 20 MG tablet   Oral   Take 1 tablet (20 mg total) by mouth daily.   30 tablet   5   . lamoTRIgine (LAMICTAL) 200 MG tablet   Oral   Take 1 tablet (200 mg total) by mouth 2 (two) times daily. Brand name medically necessary.   60 tablet   5     Dispense as written.   [name] Kitchen levofloxacin (LEVAQUIN) 500 MG tablet   Oral   Take 1 tablet (500 mg total) by mouth daily.   7 tablet   0   . norgestimate-ethinyl estradiol (ORTHO-CYCLEN,SPRINTEC,PREVIFEM) 0.25-35 MG-MCG tablet  BP 126/80  Pulse 84  Temp(Src) 98 F (36.7 C) (Oral)  Resp 18  SpO2 100%  LMP 01/02/2014 Physical Exam  Nursing note and vitals reviewed. Constitutional: She is oriented to person, place, and time. Vital signs are normal. She appears well-developed and well-nourished. No distress.  HENT:  Head: Normocephalic and atraumatic.  Right Ear: External ear normal.  Left Ear: External ear normal.  Nose: Nose normal.  Mouth/Throat: Oropharynx is clear and moist.  Eyes: Conjunctivae are normal. Right eye exhibits no discharge. Left eye exhibits no discharge.  Neck: Normal range of motion. Neck supple.   Cardiovascular: Normal rate, regular rhythm and normal heart sounds.  Exam reveals no gallop and no friction rub.   No murmur heard. Pulmonary/Chest: Effort normal and breath sounds normal. No respiratory distress. She has no wheezes. She has no rales.  Abdominal: Soft. There is no tenderness.  Lymphadenopathy:    She has cervical adenopathy (tonsillar).  Neurological: She is alert and oriented to person, place, and time. She has normal strength. Coordination normal.  Skin: Skin is warm and dry. No rash noted. She is not diaphoretic.  Psychiatric: She has a normal mood and affect. Judgment normal.    ED Course  Procedures (including critical care time) Labs Review Labs Reviewed  CULTURE, GROUP A STREP  POCT RAPID STREP A ([facility] URG CARE ONLY)   Imaging Review Dg Chest 2 View  01/09/2014   CLINICAL DATA:  Cough, shortness of breath, wheezing.  EXAM: CHEST  2 VIEW  COMPARISON:  None.  FINDINGS: The cardiomediastinal silhouette is within normal limits. The lungs are mildly hypoinflated. There may be patchy opacity in the posterior lung base on the lateral image without clear correlate on the PA image. The lungs are otherwise clear. There is no pleural effusion or pneumothorax. No acute osseous abnormality is seen.  IMPRESSION: Patchy posterior basilar infiltrate on the lateral image versus superimposition of overlying osseous structures. Lungs otherwise clear.   Electronically Signed   By: Logan Bores   On: 01/09/2014 12:45     MDM   1. CAP (community acquired pneumonia)    X-ray indicates possible pneumonia. Given the duration of her symptoms, we will treat with Levaquin. Continue albuterol as needed. Advised her to take Mucinex and increase fluids. Followup when necessary. ED if worsening.  Meds ordered this encounter  Medications  . dexamethasone (DECADRON) injection 10 mg    Sig:   . levofloxacin (LEVAQUIN) 500 MG tablet    Sig: Take 1 tablet (500 mg total) by mouth daily.     Dispense:  7 tablet    Refill:  0    Order Specific Question:  Supervising Provider    Answer:  Lynne Leader, [place]  . albuterol (PROVENTIL) (2.5 MG/3ML) 0.083% nebulizer solution    Sig: Take 3 mLs (2.5 mg total) by nebulization every 4 (four) hours as needed for wheezing or shortness of breath.    Dispense:  75 mL    Refill:  1    Order Specific Question:  Supervising Provider    Answer:  Lynne Leader, [place]     Liam Graham, PA-C 01/09/14 1312

## 2014-01-09 NOTE — Discharge Instructions (Signed)

## 2014-01-09 NOTE — ED Notes (Signed)
Asthma since a child, but had been bothering her more recently. Concern for poss strep. Dry cough

## 2014-01-11 LAB — CULTURE, GROUP A STREP

## 2014-02-20 ENCOUNTER — Encounter (HOSPITAL_COMMUNITY): Payer: Self-pay

## 2014-02-26 ENCOUNTER — Telehealth (HOSPITAL_COMMUNITY): Payer: Self-pay

## 2014-03-02 ENCOUNTER — Other Ambulatory Visit (HOSPITAL_COMMUNITY): Payer: Self-pay | Admitting: Psychiatry

## 2014-03-02 DIAGNOSIS — F319 Bipolar disorder, unspecified: Secondary | ICD-10-CM

## 2014-03-02 MED ORDER — ARIPIPRAZOLE 20 MG PO TABS: 20.0000 mg | ORAL_TABLET | Freq: Every day | ORAL | Status: DC

## 2014-03-02 MED ORDER — LAMOTRIGINE 200 MG PO TABS: 200.0000 mg | ORAL_TABLET | Freq: Two times a day (BID) | ORAL | Status: DC

## 2014-03-02 NOTE — Telephone Encounter (Signed)
I returned patient's phone call.  She has seen by Dr. Laurance Flatten on the 6 months ago however she could not see any provider since her psychiatrist at the practice.  She is scheduled to see a new physician on May 8.  She needs refills of Abilify and Lamictal.  Patient denies any side effects of medication.  We will provide a 30 day prescription of Abilify and Lamictal.  Recommended to keep an appointment on May 8th with her new psychiatrist.

## 2014-03-03 ENCOUNTER — Other Ambulatory Visit (HOSPITAL_COMMUNITY): Payer: Self-pay | Admitting: *Deleted

## 2014-03-03 ENCOUNTER — Telehealth (HOSPITAL_COMMUNITY): Payer: Self-pay | Admitting: *Deleted

## 2014-03-03 ENCOUNTER — Ambulatory Visit (HOSPITAL_COMMUNITY): Payer: Self-pay | Admitting: Psychiatry

## 2014-03-03 DIAGNOSIS — F319 Bipolar disorder, unspecified: Secondary | ICD-10-CM

## 2014-03-03 MED ORDER — LAMOTRIGINE 200 MG PO TABS
200.0000 mg | ORAL_TABLET | Freq: Two times a day (BID) | ORAL | Status: DC
Start: 2014-03-03 — End: 2014-03-20

## 2014-03-03 NOTE — Telephone Encounter (Signed)
Called pt:Informed pt that RX availble for pick up in [place] office.Insurance required written RX for Brand name

## 2014-03-03 NOTE — Telephone Encounter (Signed)
Received faxed notification from pharmacy.Prescription for Lamictal must be Written RX, With DAW on it and hand write "Brand Name Medically Necessary" on RX.Escribes will not be accepted. Prescription printed for MD signature

## 2014-03-09 ENCOUNTER — Telehealth (HOSPITAL_COMMUNITY): Payer: Self-pay | Admitting: *Deleted

## 2014-03-09 NOTE — Telephone Encounter (Signed)
requested faxed RX for Lamictal, with Brand Name medically necessary written  on it.  [place]: Informed Juliann Pulse that Rx written 4/21 and here in office for pt to pick up.Printed on thermographic paper -- will have "VOID" imprinted on it if faxed.She requested it be faxed in spite of this and they will attach Rx originially esribed to it at pharmacy.  RX faxed to pharmacy

## 2014-03-20 ENCOUNTER — Ambulatory Visit (INDEPENDENT_AMBULATORY_CARE_PROVIDER_SITE_OTHER): Payer: 59 | Admitting: Psychiatry

## 2014-03-20 ENCOUNTER — Encounter (HOSPITAL_COMMUNITY): Payer: Self-pay | Admitting: Psychiatry

## 2014-03-20 DIAGNOSIS — F3130 Bipolar disorder, current episode depressed, mild or moderate severity, unspecified: Secondary | ICD-10-CM

## 2014-03-20 DIAGNOSIS — F319 Bipolar disorder, unspecified: Secondary | ICD-10-CM

## 2014-03-20 DIAGNOSIS — F3110 Bipolar disorder, current episode manic without psychotic features, unspecified: Secondary | ICD-10-CM

## 2014-03-20 MED ORDER — ARIPIPRAZOLE 20 MG PO TABS: 20.0000 mg | ORAL_TABLET | Freq: Every day | ORAL | Status: DC

## 2014-03-20 MED ORDER — LAMOTRIGINE 200 MG PO TABS: 200.0000 mg | ORAL_TABLET | Freq: Two times a day (BID) | ORAL | Status: DC

## 2014-03-20 NOTE — Progress Notes (Signed)
   [place] Follow-up Outpatient Visit  Rachel [name] 06/26/1986  Date: 03/19/2014   Subjective: Patient is a 28 years old single Caucasian female who is going to be engaged in 2 weeks. She has been diagnosed with bipolar disorder and ADHD. She has followed with Dr. Laurance Flatten. She's been diagnosed with bipolar disorder and has been stable on medication which includes Lamictal and Abilify.  She comes every 6 months for refills. States that she's been stable on her current medication there is no reported side effects of rash or involuntary movements. Ref states that she was bullied when she was young and has been diagnosed with ADHD when she was young Diagnoses changed to bipolar disorder she has been hospitalized couple of times and she was in her teens.  She has no flashbacks from her teen years. She is optimistic and continues to move forward.  She ended herself last year well work and has left shoulder pain. She has gone through [employer].  There were no vitals filed for this visit.  Mental Status Examination  Appearance: Casual Alert: Yes Attention: fair  Cooperative: Yes Eye Contact: Fair Speech: Normal rate Psychomotor Activity: Decreased Memory/Concentration: Reasonable memory normal concentration Oriented: person, place, time/date, situation and day of week Mood: Euthymic Affect: Constricted Thought Processes and Associations: Logical Fund of Knowledge: Fair Thought Content: No suicidal or homicidal toughts. No psychosis. Insight: Good Judgement: Good  Diagnosis: Bipolar disorder depressed type. ADHD per history  Treatment Plan: Continue Lamictal and Abilify she is doing reasonable side effects reviewed none as of now no involuntary movements noticeable follow up in 3 months for new prescriptions in case if she does not get Abilify she can call in for another refill  Merian Capron, MD

## 2014-03-22 ENCOUNTER — Other Ambulatory Visit: Payer: Self-pay | Admitting: Psychiatry

## 2014-04-16 ENCOUNTER — Ambulatory Visit (INDEPENDENT_AMBULATORY_CARE_PROVIDER_SITE_OTHER): Payer: Self-pay

## 2014-04-16 ENCOUNTER — Ambulatory Visit (INDEPENDENT_AMBULATORY_CARE_PROVIDER_SITE_OTHER): Payer: 59 | Admitting: Neurology

## 2014-04-16 DIAGNOSIS — M79609 Pain in unspecified limb: Secondary | ICD-10-CM

## 2014-04-16 DIAGNOSIS — Z0289 Encounter for other administrative examinations: Secondary | ICD-10-CM

## 2014-04-16 NOTE — Procedures (Signed)
     HISTORY:  Rachel [name] is a 28 year old patient with a greater than one-year history of left shoulder discomfort that she claims was associated with a transient dislocation. More recently in April 2015, she reinjured the left shoulder, and she reports weakness in the left arm that includes weakness of grip, and tingling sensations down the left arm and pain from the left neck to the left hand. The patient is being evaluated for a possible neuropathy or a cervical radiculopathy.  NERVE CONDUCTION STUDIES:  Nerve conduction studies were performed on both upper extremities. The distal motor latencies and motor amplitudes for the median, radial, and ulnar nerves were within normal limits. The F wave latencies and nerve conduction velocities for these nerves were also normal. The sensory latencies for the median, radial, and ulnar nerves were normal.   EMG STUDIES:  EMG study was performed on the left upper extremity:  The first dorsal interosseous muscle reveals 2 to 4 K units with full recruitment. No fibrillations or positive waves were noted. The abductor pollicis brevis muscle reveals 2 to 4 K units with full recruitment. No fibrillations or positive waves were noted. The extensor indicis proprius muscle reveals 1 to 3 K units with full recruitment. No fibrillations or positive waves were noted. The pronator teres muscle reveals 2 to 3 K units with full recruitment. No fibrillations or positive waves were noted. The biceps muscle reveals 1 to 2 K units with full recruitment. No fibrillations or positive waves were noted. The triceps muscle reveals 2 to 4 K units with full recruitment. No fibrillations or positive waves were noted. The anterior deltoid muscle reveals 2 to 3 K units with full recruitment. No fibrillations or positive waves were noted. The cervical paraspinal muscles were tested at 2 levels. No abnormalities of insertional activity were seen at either level tested.  There was poor relaxation.   IMPRESSION:  Nerve conduction studies done on both upper extremities were within normal limits, no evidence of a neuropathy is seen. EMG evaluation of the left upper extremity is normal. There is no evidence of a left cervical radiculopathy.  Jill Alexanders MD 04/16/2014 10:35 AM  [facility] [address] [place], [place] [address]  Phone [phone] Fax [phone]

## 2014-04-21 ENCOUNTER — Telehealth (HOSPITAL_COMMUNITY): Payer: Self-pay | Admitting: Psychiatry

## 2014-04-21 NOTE — Telephone Encounter (Signed)
Dr Grandville Silos (hand surgeon) had  Called and discussion of possible rule out psychosomatic elements in regard to her shoulder. Says tests and reports are not suggestive of organic elements and she has gone through the treatment that could have helped. She still has resistence to movements but not clear organic evidence.

## 2014-04-29 ENCOUNTER — Other Ambulatory Visit (HOSPITAL_COMMUNITY): Payer: Self-pay | Admitting: Psychiatry

## 2014-04-30 ENCOUNTER — Other Ambulatory Visit (HOSPITAL_COMMUNITY): Payer: Self-pay | Admitting: Psychiatry

## 2014-04-30 DIAGNOSIS — F319 Bipolar disorder, unspecified: Secondary | ICD-10-CM

## 2014-04-30 MED ORDER — LAMOTRIGINE 200 MG PO TABS: 200.0000 mg | ORAL_TABLET | Freq: Two times a day (BID) | ORAL | Status: DC

## 2014-04-30 NOTE — Progress Notes (Signed)
Printed and hand wrote "brand name necessary for lamictal" as requested. Faxed

## 2014-04-30 NOTE — Telephone Encounter (Signed)
Please contact Dr Paticia Stack in [place].

## 2014-06-10 ENCOUNTER — Telehealth (HOSPITAL_COMMUNITY): Payer: Self-pay | Admitting: Psychiatry

## 2014-06-10 DIAGNOSIS — F319 Bipolar disorder, unspecified: Secondary | ICD-10-CM

## 2014-06-10 MED ORDER — LAMOTRIGINE 200 MG PO TABS: 200.0000 mg | ORAL_TABLET | Freq: Two times a day (BID) | ORAL | Status: DC

## 2014-06-10 NOTE — Telephone Encounter (Signed)
Brand name medically necessary added to lamictal.

## 2014-06-18 ENCOUNTER — Encounter (INDEPENDENT_AMBULATORY_CARE_PROVIDER_SITE_OTHER): Payer: Self-pay

## 2014-06-18 ENCOUNTER — Ambulatory Visit (INDEPENDENT_AMBULATORY_CARE_PROVIDER_SITE_OTHER): Payer: Federal, State, Local not specified - Other | Admitting: Psychiatry

## 2014-06-18 VITALS — Wt 144.0 lb

## 2014-06-18 DIAGNOSIS — F900 Attention-deficit hyperactivity disorder, predominantly inattentive type: Secondary | ICD-10-CM

## 2014-06-18 DIAGNOSIS — F319 Bipolar disorder, unspecified: Secondary | ICD-10-CM

## 2014-06-18 DIAGNOSIS — F909 Attention-deficit hyperactivity disorder, unspecified type: Secondary | ICD-10-CM

## 2014-06-18 MED ORDER — LAMOTRIGINE 200 MG PO TABS: 200.0000 mg | ORAL_TABLET | Freq: Two times a day (BID) | ORAL | Status: DC

## 2014-06-18 MED ORDER — ARIPIPRAZOLE 20 MG PO TABS: 20.0000 mg | ORAL_TABLET | Freq: Every day | ORAL | Status: DC
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
## 2014-06-19 ENCOUNTER — Ambulatory Visit (HOSPITAL_COMMUNITY): Payer: Self-pay | Admitting: Psychiatry

## 2014-07-02 ENCOUNTER — Telehealth (HOSPITAL_COMMUNITY): Payer: Self-pay | Admitting: Psychiatry

## 2014-07-02 DIAGNOSIS — F319 Bipolar disorder, unspecified: Secondary | ICD-10-CM

## 2014-07-02 MED ORDER — LAMOTRIGINE 200 MG PO TABS: 200.0000 mg | ORAL_TABLET | Freq: Two times a day (BID) | ORAL | Status: DC

## 2014-07-02 NOTE — Telephone Encounter (Signed)
lamictal # 60. Brand name necessary hand written.

## 2014-08-03 ENCOUNTER — Telehealth (HOSPITAL_COMMUNITY): Payer: Self-pay

## 2014-08-03 DIAGNOSIS — F319 Bipolar disorder, unspecified: Secondary | ICD-10-CM

## 2014-08-04 NOTE — Telephone Encounter (Signed)
Dr. Evalina Field pt called yesterday to have paperwork filled out to recieve samples of Abilify. Pt's mother needs the forms completed within a few days. Pt has lost her insurance and is out of her medication.

## 2014-08-07 MED ORDER — ARIPIPRAZOLE 20 MG PO TABS
20.0000 mg | ORAL_TABLET | Freq: Every day | ORAL | Status: DC
Start: 2014-08-07 — End: 2014-08-11

## 2014-08-07 NOTE — Telephone Encounter (Signed)
Rx is reprinted and the paper work is signed by me and faxed to the appropriate numbers by CMA.  CMA will note confirmation of receipt of info by company with date and time. Marlane Hatcher. Juanpablo Ciresi RPAC 12:07 PM 08/07/2014

## 2014-08-11 ENCOUNTER — Other Ambulatory Visit (HOSPITAL_COMMUNITY): Payer: Self-pay | Admitting: Physician Assistant

## 2014-08-11 DIAGNOSIS — F319 Bipolar disorder, unspecified: Secondary | ICD-10-CM

## 2014-08-11 MED ORDER — ARIPIPRAZOLE 20 MG PO TABS: 20.0000 mg | ORAL_TABLET | Freq: Every day | ORAL | Status: DC

## 2014-08-11 NOTE — Telephone Encounter (Signed)
Rx reprinted with patient's married name on it. Rachel [name]. [name] RPAC 5:27 PM 08/11/2014

## 2014-08-14 ENCOUNTER — Telehealth (HOSPITAL_COMMUNITY): Payer: Self-pay | Admitting: *Deleted

## 2014-08-14 NOTE — Telephone Encounter (Signed)
Called Pt to discuss possible options for medications. Pt informed me she has insurance through [employer] and is able to get her medications.

## 2014-08-18 ENCOUNTER — Ambulatory Visit (HOSPITAL_COMMUNITY): Payer: Self-pay | Admitting: Psychiatry

## 2014-08-27 ENCOUNTER — Ambulatory Visit (INDEPENDENT_AMBULATORY_CARE_PROVIDER_SITE_OTHER): Payer: BC Managed Care – PPO | Admitting: Psychiatry

## 2014-08-27 ENCOUNTER — Encounter (INDEPENDENT_AMBULATORY_CARE_PROVIDER_SITE_OTHER): Payer: Self-pay

## 2014-08-27 ENCOUNTER — Encounter (HOSPITAL_COMMUNITY): Payer: Self-pay | Admitting: Psychiatry

## 2014-08-27 VITALS — BP 109/82 | HR 94 | Ht 63.0 in | Wt 141.0 lb

## 2014-08-27 DIAGNOSIS — F909 Attention-deficit hyperactivity disorder, unspecified type: Secondary | ICD-10-CM

## 2014-08-27 DIAGNOSIS — F900 Attention-deficit hyperactivity disorder, predominantly inattentive type: Secondary | ICD-10-CM

## 2014-08-27 DIAGNOSIS — F3130 Bipolar disorder, current episode depressed, mild or moderate severity, unspecified: Secondary | ICD-10-CM

## 2014-08-27 DIAGNOSIS — F319 Bipolar disorder, unspecified: Secondary | ICD-10-CM

## 2014-08-27 MED ORDER — ARIPIPRAZOLE 20 MG PO TABS: 20.0000 mg | ORAL_TABLET | Freq: Every day | ORAL | Status: DC

## 2014-08-27 MED ORDER — LAMOTRIGINE 200 MG PO TABS: ORAL_TABLET | ORAL | Status: DC

## 2014-08-27 MED ORDER — LAMOTRIGINE 200 MG PO TABS: 200.0000 mg | ORAL_TABLET | Freq: Two times a day (BID) | ORAL | Status: DC
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
## 2014-10-01 ENCOUNTER — Encounter (INDEPENDENT_AMBULATORY_CARE_PROVIDER_SITE_OTHER): Payer: Self-pay

## 2014-10-01 ENCOUNTER — Ambulatory Visit (INDEPENDENT_AMBULATORY_CARE_PROVIDER_SITE_OTHER): Payer: BC Managed Care – PPO | Admitting: Psychiatry

## 2014-10-01 ENCOUNTER — Encounter (HOSPITAL_COMMUNITY): Payer: Self-pay | Admitting: Psychiatry

## 2014-10-01 VITALS — BP 109/67 | HR 89 | Ht 63.0 in | Wt 138.0 lb

## 2014-10-01 DIAGNOSIS — F909 Attention-deficit hyperactivity disorder, unspecified type: Secondary | ICD-10-CM

## 2014-10-01 DIAGNOSIS — F900 Attention-deficit hyperactivity disorder, predominantly inattentive type: Secondary | ICD-10-CM

## 2014-10-01 DIAGNOSIS — F319 Bipolar disorder, unspecified: Secondary | ICD-10-CM

## 2014-10-01 DIAGNOSIS — F3130 Bipolar disorder, current episode depressed, mild or moderate severity, unspecified: Secondary | ICD-10-CM

## 2014-10-01 MED ORDER — LAMOTRIGINE 25 MG PO TABS: ORAL_TABLET | ORAL | Status: DC

## 2014-10-01 MED ORDER — ARIPIPRAZOLE 20 MG PO TABS: 20.0000 mg | ORAL_TABLET | Freq: Every day | ORAL | Status: DC

## 2014-10-01 MED ORDER — LAMOTRIGINE 100 MG PO TABS: ORAL_TABLET | ORAL | Status: DC
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
## 2014-10-13 ENCOUNTER — Ambulatory Visit (HOSPITAL_COMMUNITY): Payer: Self-pay | Admitting: Psychiatry

## 2014-11-11 ENCOUNTER — Telehealth (HOSPITAL_COMMUNITY): Payer: Self-pay

## 2014-11-11 NOTE — Telephone Encounter (Signed)
Please advise this patient that she will need to discuss her medication management with Dr. Loni Muse. On her next office visit. It is not appropriate to change, increase or stop medication over the phone by a patient I have never seen before.Rachel [name]. Cristino Degroff RPAC 6:54 PM 11/11/2014

## 2014-11-11 NOTE — Telephone Encounter (Signed)
Dr. Mickey Farber PT, started back on Lamictal and she is getting tremors again. She stopped taking the medication 2 weeks ago. She would like to speak to you about starting the medication again.

## 2014-11-16 ENCOUNTER — Emergency Department (INDEPENDENT_AMBULATORY_CARE_PROVIDER_SITE_OTHER)
Admission: EM | Admit: 2014-11-16 | Discharge: 2014-11-16 | Disposition: A | Discharge status: Home / Self Care | Payer: BLUE CROSS/BLUE SHIELD | Source: Home / Self Care | Attending: Emergency Medicine | Admitting: Emergency Medicine

## 2014-11-16 ENCOUNTER — Encounter (HOSPITAL_COMMUNITY): Payer: Self-pay

## 2014-11-16 DIAGNOSIS — J111 Influenza due to unidentified influenza virus with other respiratory manifestations: Secondary | ICD-10-CM

## 2014-11-16 LAB — POCT RAPID STREP A: Streptococcus, Group A Screen (Direct): NEGATIVE

## 2014-11-16 MED ORDER — OSELTAMIVIR PHOSPHATE 75 MG PO CAPS: 75.0000 mg | ORAL_CAPSULE | Freq: Two times a day (BID) | ORAL | Status: DC

## 2014-11-16 MED ORDER — ONDANSETRON 4 MG PO TBDP
4.0000 mg | ORAL_TABLET | Freq: Once | ORAL | Status: AC
  Administered 2014-11-16: 4 mg via ORAL

## 2014-11-16 MED ORDER — ONDANSETRON 4 MG PO TBDP
ORAL_TABLET | ORAL | Status: AC
  Filled 2014-11-16: qty 1

## 2014-11-16 MED ORDER — ONDANSETRON 4 MG PO TBDP: 4.0000 mg | ORAL_TABLET | Freq: Once | ORAL | Status: DC

## 2014-11-16 NOTE — ED Notes (Signed)
Patient complains of sore throat past two days Some mild nausea and body aches Denies any temperature

## 2014-11-16 NOTE — ED Provider Notes (Signed)
CSN: [id number]     Arrival date & time 11/16/14  1758 History   First MD Initiated Contact with Patient 11/16/14 1814     Chief Complaint  Patient presents with  . Sore Throat  . Nausea   (Consider location/radiation/quality/duration/timing/severity/associated sxs/prior Treatment) HPI  She is a 29 year old woman here for evaluation of sore throat. Her symptoms started 3 days ago. They include sore throat, rhinorrhea, cough, body aches, low-grade temp. She also reports significant nausea. She denies any vomiting. She is able to tolerate ginger ale. Note abdominal pain or diarrhea.  She also reports some palpitations.  Past Medical History  Diagnosis Date  . ADHD (attention deficit hyperactivity disorder)   . Depression   . Bipolar disorder   . Asthma    History reviewed. No pertinent past surgical history. Family History  Problem Relation Age of Onset  . Depression Mother   . Anxiety disorder Mother   . Depression Father   . Bipolar disorder Sister   . ADD / ADHD Sister    History  Substance Use Topics  . Smoking status: Never Smoker   . Smokeless tobacco: Not on file  . Alcohol Use: No   OB History    No data available     Review of Systems  Constitutional: Positive for fever, chills and appetite change.  HENT: Positive for rhinorrhea and sore throat. Negative for congestion and trouble swallowing.   Respiratory: Positive for cough. Negative for shortness of breath.   Cardiovascular: Positive for palpitations. Negative for chest pain.  Gastrointestinal: Positive for nausea. Negative for vomiting, abdominal pain and diarrhea.  Musculoskeletal: Positive for myalgias.    Allergies  Benadryl; Olanzapine; Penicillins; and Prozac  Home Medications   Prior to Admission medications   Medication Sig Start Date End Date Taking? Authorizing Provider  albuterol (PROVENTIL) (2.5 MG/3ML) 0.083% nebulizer solution Take 3 mLs (2.5 mg total) by nebulization every 4 (four) hours  as needed for wheezing or shortness of breath. 01/09/14   Liam Graham, PA-C  ARIPiprazole (ABILIFY) 20 MG tablet Take 1 tablet (20 mg total) by mouth daily. 10/01/14   Merian Capron, MD  lamoTRIgine (LAMICTAL) 100 MG tablet Start in 4 weeks. After completion of starter doses. 10/01/14   Merian Capron, MD  lamoTRIgine (LAMICTAL) 25 MG tablet Take one tablet daily for a week and then start taking 2 tablets. Start taking 3 at the third week and 4 tablets starting the fourth week. 10/01/14   Merian Capron, MD  levofloxacin (LEVAQUIN) 500 MG tablet Take 1 tablet (500 mg total) by mouth daily. 01/09/14   Liam Graham, PA-C  norgestimate-ethinyl estradiol (ORTHO-CYCLEN,SPRINTEC,PREVIFEM) 0.25-35 MG-MCG tablet  03/30/12   Historical Provider, MD  ondansetron (ZOFRAN-ODT) 4 MG disintegrating tablet Take 1 tablet (4 mg total) by mouth once. 11/16/14   Melony Overly, MD  oseltamivir (TAMIFLU) 75 MG capsule Take 1 capsule (75 mg total) by mouth every 12 (twelve) hours. 11/16/14   Melony Overly, MD   BP 110/82 mmHg  Pulse 94  Temp(Src) 98.5 F (36.9 C) (Oral)  Resp 18  SpO2 97% Physical Exam  Constitutional: She is oriented to person, place, and time. She appears well-developed and well-nourished. She appears distressed.  HENT:  Head: Normocephalic.  Right Ear: External ear normal.  Left Ear: External ear normal.  Nose: Nose normal.  Mouth/Throat: Mucous membranes are not dry. Posterior oropharyngeal erythema present. No oropharyngeal exudate.  Neck: Neck supple.  Cardiovascular: Normal rate and regular rhythm.  No murmur heard. Pulmonary/Chest: Effort normal and breath sounds normal. No respiratory distress. She has no wheezes. She has no rales.  Abdominal: Soft. Bowel sounds are normal. She exhibits no distension. There is no tenderness. There is no rebound and no guarding.  Lymphadenopathy:    She has no cervical adenopathy.  Neurological: She is alert and oriented to person, place, and time.     ED Course  Procedures (including critical care time) Labs Review Labs Reviewed  POCT RAPID STREP A ([facility]RG CARE ONLY)    Imaging Review No results found.   MDM   1. Influenza    Zofran 4 mg ODT given.  She states her nausea is some improved after the Zofran. Rapid strep is negative, she likely has flu. Tamiflu for 5 days. Zofran as needed for nausea. Follow-up as needed.    Melony Overly, MD 11/16/14 [phone]

## 2014-11-16 NOTE — Discharge Instructions (Signed)
You have the flu. Drink plenty of fluids. Take Tamiflu twice a day for 5 days. Use zofran every 8 hours as needed for nausea.  Followup as needed.

## 2014-11-18 LAB — CULTURE, GROUP A STREP

## 2014-12-01 ENCOUNTER — Encounter (HOSPITAL_COMMUNITY): Payer: Self-pay | Admitting: Psychiatry

## 2014-12-01 ENCOUNTER — Ambulatory Visit (INDEPENDENT_AMBULATORY_CARE_PROVIDER_SITE_OTHER): Payer: Federal, State, Local not specified - Other | Admitting: Psychiatry

## 2014-12-01 VITALS — BP 122/86 | HR 92 | Ht 63.0 in | Wt 133.0 lb

## 2014-12-01 DIAGNOSIS — F909 Attention-deficit hyperactivity disorder, unspecified type: Secondary | ICD-10-CM

## 2014-12-01 DIAGNOSIS — F3130 Bipolar disorder, current episode depressed, mild or moderate severity, unspecified: Secondary | ICD-10-CM

## 2014-12-01 DIAGNOSIS — F319 Bipolar disorder, unspecified: Secondary | ICD-10-CM

## 2014-12-01 MED ORDER — LAMOTRIGINE 25 MG PO TABS: ORAL_TABLET | ORAL | Status: DC

## 2014-12-01 MED ORDER — ARIPIPRAZOLE 20 MG PO TABS: 20.0000 mg | ORAL_TABLET | Freq: Every day | ORAL | Status: DC
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
## 2015-01-01 ENCOUNTER — Ambulatory Visit (HOSPITAL_COMMUNITY): Payer: Self-pay | Admitting: Psychiatry

## 2015-10-03 ENCOUNTER — Encounter: Payer: Self-pay | Admitting: Emergency Medicine

## 2015-10-03 ENCOUNTER — Emergency Department (INDEPENDENT_AMBULATORY_CARE_PROVIDER_SITE_OTHER)
Admission: EM | Admit: 2015-10-03 | Discharge: 2015-10-03 | Disposition: A | Discharge status: Home / Self Care | Payer: Managed Care, Other (non HMO) | Source: Home / Self Care | Attending: Emergency Medicine | Admitting: Emergency Medicine

## 2015-10-03 DIAGNOSIS — B349 Viral infection, unspecified: Secondary | ICD-10-CM | POA: Diagnosis not present

## 2015-10-03 LAB — POCT CBC W AUTO DIFF (K'VILLE URGENT CARE)

## 2015-10-03 LAB — POCT URINALYSIS DIP (MANUAL ENTRY)
BILIRUBIN UA: NEGATIVE
BILIRUBIN UA: NEGATIVE
Blood, UA: NEGATIVE
Glucose, UA: NEGATIVE
Leukocytes, UA: NEGATIVE
Nitrite, UA: NEGATIVE
PH UA: 5.5
Protein Ur, POC: NEGATIVE
Spec Grav, UA: >=1.03
Urobilinogen, UA: 0.2

## 2015-10-03 LAB — POCT URINE PREGNANCY: Preg Test, Ur: NEGATIVE

## 2015-10-03 MED ORDER — ONDANSETRON 4 MG PO TBDP: 4.0000 mg | ORAL_TABLET | Freq: Once | ORAL | Status: DC

## 2015-10-03 MED ORDER — ONDANSETRON 4 MG PO TBDP
4.0000 mg | ORAL_TABLET | Freq: Once | ORAL | Status: AC
  Administered 2015-10-03: 4 mg via ORAL

## 2015-10-03 NOTE — Discharge Instructions (Signed)
Viral Infections A viral infection can be caused by different types of viruses.Most viral infections are not serious and resolve on their own. However, some infections may cause severe symptoms and may lead to further complications. SYMPTOMS Viruses can frequently cause:  Minor sore throat.  Aches and pains.  Headaches.  Runny nose.  Different types of rashes.  Watery eyes.  Tiredness.  Cough.  Loss of appetite.  Gastrointestinal infections, resulting in nausea, vomiting, and diarrhea. These symptoms do not respond to antibiotics because the infection is not caused by bacteria. However, you might catch a bacterial infection following the viral infection. This is sometimes called a "superinfection." Symptoms of such a bacterial infection may include:  Worsening sore throat with pus and difficulty swallowing.  Swollen neck glands.  Chills and a high or persistent fever.  Severe headache.  Tenderness over the sinuses.  Persistent overall ill feeling (malaise), muscle aches, and tiredness (fatigue).  Persistent cough.  Yellow, green, or brown mucus production with coughing. HOME CARE INSTRUCTIONS   Only take over-the-counter or prescription medicines for pain, discomfort, diarrhea, or fever as directed by your caregiver.  Drink enough water and fluids to keep your urine clear or pale yellow. Sports drinks can provide valuable electrolytes, sugars, and hydration.  Get plenty of rest and maintain proper nutrition. Soups and broths with crackers or rice are fine. SEEK IMMEDIATE MEDICAL CARE IF:   You have severe headaches, shortness of breath, chest pain, neck pain, or an unusual rash.  You have uncontrolled vomiting, diarrhea, or you are unable to keep down fluids.  You or your child has an oral temperature above 102 F (38.9 C), not controlled by medicine.  Your baby is older than 3 months with a rectal temperature of 102 F (38.9 C) or higher.  Your baby is 32  months old or younger with a rectal temperature of 100.4 F (38 C) or higher. MAKE SURE YOU:   Understand these instructions.  Will watch your condition.  Will get help right away if you are not doing well or get worse.   This information is not intended to replace advice given to you by your health care provider. Make sure you discuss any questions you have with your health care provider.   Document Released: 08/09/2005 Document Revised: 01/22/2012 Document Reviewed: 04/07/2015 Elsevier Interactive Patient Education 2016 [place]. Diarrhea Diarrhea is frequent loose and watery bowel movements. It can cause you to feel weak and dehydrated. Dehydration can cause you to become tired and thirsty, have a dry mouth, and have decreased urination that often is dark yellow. Diarrhea is a sign of another problem, most often an infection that will not last long. In most cases, diarrhea typically lasts 2-3 days. However, it can last longer if it is a sign of something more serious. It is important to treat your diarrhea as directed by your caregiver to lessen or prevent future episodes of diarrhea. CAUSES  Some common causes include:  Gastrointestinal infections caused by viruses, bacteria, or parasites.  Food poisoning or food allergies.  Certain medicines, such as antibiotics, chemotherapy, and laxatives.  Artificial sweeteners and fructose.  Digestive disorders. HOME CARE INSTRUCTIONS  Ensure adequate fluid intake (hydration): Have 1 cup (8 oz) of fluid for each diarrhea episode. Avoid fluids that contain simple sugars or sports drinks, fruit juices, whole milk products, and sodas. Your urine should be clear or pale yellow if you are drinking enough fluids. Hydrate with an oral rehydration solution that you can  purchase at pharmacies, retail stores, and online. You can prepare an oral rehydration solution at home by mixing the following ingredients together:   - tsp table salt.   tsp  baking soda.   tsp salt substitute containing potassium chloride.  1  tablespoons sugar.  1 L (34 oz) of water.  Certain foods and beverages may increase the speed at which food moves through the gastrointestinal (GI) tract. These foods and beverages should be avoided and include:  Caffeinated and alcoholic beverages.  High-fiber foods, such as raw fruits and vegetables, nuts, seeds, and whole grain breads and cereals.  Foods and beverages sweetened with sugar alcohols, such as xylitol, sorbitol, and mannitol.  Some foods may be well tolerated and may help thicken stool including:  Starchy foods, such as rice, toast, pasta, low-sugar cereal, oatmeal, grits, baked potatoes, crackers, and bagels.  Bananas.  Applesauce.  Add probiotic-rich foods to help increase healthy bacteria in the GI tract, such as yogurt and fermented milk products.  Wash your hands well after each diarrhea episode.  Only take over-the-counter or prescription medicines as directed by your caregiver.  Take a warm bath to relieve any burning or pain from frequent diarrhea episodes. SEEK IMMEDIATE MEDICAL CARE IF:   You are unable to keep fluids down.  You have persistent vomiting.  You have blood in your stool, or your stools are black and tarry.  You do not urinate in 6-8 hours, or there is only a small amount of very dark urine.  You have abdominal pain that increases or localizes.  You have weakness, dizziness, confusion, or light-headedness.  You have a severe headache.  Your diarrhea gets worse or does not get better.  You have a fever or persistent symptoms for more than 2-3 days.  You have a fever and your symptoms suddenly get worse. MAKE SURE YOU:   Understand these instructions.  Will watch your condition.  Will get help right away if you are not doing well or get worse.   This information is not intended to replace advice given to you by your health care provider. Make sure you  discuss any questions you have with your health care provider.   Document Released: 10/20/2002 Document Revised: 11/20/2014 Document Reviewed: 07/07/2012 Elsevier Interactive Patient Education [employer].

## 2015-10-03 NOTE — ED Notes (Signed)
Patient C/O nausea vomiting and dizziness since this morning VS stables.

## 2015-10-03 NOTE — ED Provider Notes (Signed)
CSN: [id number]     Arrival date & time 10/03/15  1606 History   First MD Initiated Contact with Patient 10/03/15 1643     Chief Complaint  Patient presents with  . Nausea  . Diarrhea  . Dizziness   (Consider location/radiation/quality/duration/timing/severity/associated sxs/prior Treatment) Patient is a 29 y.o. female presenting with diarrhea and dizziness. The history is provided by the patient and a parent. No language interpreter was used.  Diarrhea Quality:  Watery Severity:  Moderate Onset quality:  Gradual Number of episodes:  2 Duration:  1 day Timing:  Constant Progression:  Worsening Relieved by:  Nothing Worsened by:  Liquids Ineffective treatments:  None tried Associated symptoms: headaches and myalgias   Associated symptoms: no abdominal pain and no fever   Risk factors: sick contacts   Risk factors: no recent antibiotic use   Dizziness Associated symptoms: diarrhea and headaches    patient complains of nausea since this a.m. she reports she has had diarrhea 2 she aches all over. Patient states this afternoon she began feeling dizzy and had trouble standing to do her job. Patient drank some apple juice and some tea with no relief of her symptoms. Patient works at [employer] she states she has been around many people who have been sick. Patient has had a history of anemia in the past. patient's mother is concerned that patient looks pale. Past Medical History  Diagnosis Date  . ADHD (attention deficit hyperactivity disorder)   . Depression   . Bipolar disorder ([place])   . Asthma    History reviewed. No pertinent past surgical history. Family History  Problem Relation Age of Onset  . Depression Mother   . Anxiety disorder Mother   . Depression Father   . Bipolar disorder Sister   . ADD / ADHD Sister    Social History  Substance Use Topics  . Smoking status: Never Smoker   . Smokeless tobacco: None  . Alcohol Use: No   OB History    No data available      Review of Systems  Constitutional: Positive for appetite change. Negative for fever.  Gastrointestinal: Positive for diarrhea. Negative for abdominal pain.  Musculoskeletal: Positive for myalgias.  Skin: Positive for pallor.  Neurological: Positive for dizziness and headaches.    Allergies  Benadryl; Olanzapine; Penicillins; and Prozac  Home Medications   Prior to Admission medications   Medication Sig Start Date End Date Taking? Authorizing Provider  albuterol (PROVENTIL) (2.5 MG/3ML) 0.083% nebulizer solution Take 3 mLs (2.5 mg total) by nebulization every 4 (four) hours as needed for wheezing or shortness of breath. 01/09/14   Liam Graham, PA-C  ARIPiprazole (ABILIFY) 20 MG tablet Take 1 tablet (20 mg total) by mouth daily. 12/01/14   Merian Capron, MD  lamoTRIgine (LAMICTAL) 25 MG tablet Take one tablet daily for a week and then start taking 2 tablets. 12/01/14   Merian Capron, MD  levofloxacin (LEVAQUIN) 500 MG tablet Take 1 tablet (500 mg total) by mouth daily. 01/09/14   Liam Graham, PA-C  norgestimate-ethinyl estradiol (ORTHO-CYCLEN,SPRINTEC,PREVIFEM) 0.25-35 MG-MCG tablet  03/30/12   Historical Provider, MD  ondansetron (ZOFRAN-ODT) 4 MG disintegrating tablet Take 1 tablet (4 mg total) by mouth once. 10/03/15   Fransico Meadow, PA-C  oseltamivir (TAMIFLU) 75 MG capsule Take 1 capsule (75 mg total) by mouth every 12 (twelve) hours. 11/16/14   Melony Overly, MD   Meds Ordered and Administered this Visit   Medications  ondansetron (ZOFRAN-ODT) disintegrating tablet  4 mg (4 mg Oral Given 10/03/15 1712)    BP 125/86 mmHg  Pulse 75  Temp(Src) 98.6 F (37 C) (Oral)  Ht 5\' 3"  (1.6 m)  Wt 138 lb (62.596 kg)  BMI 24.45 kg/m2  SpO2 100%  LMP 09/12/2015 No data found.   Physical Exam  Constitutional: She is oriented to person, place, and time. She appears well-developed and well-nourished.  HENT:  Head: Normocephalic.  Eyes: EOM are normal. Pupils are equal, round, and  reactive to light.  Neck: Normal range of motion.  Cardiovascular: Normal rate and normal heart sounds.   Pulmonary/Chest: Effort normal and breath sounds normal.  Abdominal: Soft. She exhibits no distension.  Musculoskeletal: Normal range of motion.  Neurological: She is alert and oriented to person, place, and time.  Psychiatric: She has a normal mood and affect.  Nursing note and vitals reviewed.   ED Course  Procedures (including critical care time)  Labs Review Labs Reviewed  POCT URINE PREGNANCY  POCT CBC W AUTO DIFF ([facility])  POCT URINALYSIS DIP (MANUAL ENTRY)    Imaging Review No results found.   Visual Acuity Review  Right Eye Distance:   Left Eye Distance:   Bilateral Distance:    Right Eye Near:   Left Eye Near:    Bilateral Near:         MDM  patient is given Zofran ODT she reports relief in her nausea. Patient complains of feeling sleepy and fatigued after nausea resolved. Patient has a negative pregnancy test urinalysis is negative with the exception of an elevated specific gravity. I obtained a CBC shows no evidence of anemia and no elevation of white blood cell count    1. Viral illness    I discussed with patient and her mother the fact that this is probably a viral illness she may continue to have nausea vomiting and diarrhea. The patient is given a prescription for Zofran. She is advised to follow-up with her primary care Dr. for recheck if symptoms persist past 2 days An After Visit Summary was printed and given to the patient. Meds ordered this encounter  Medications  . ondansetron (ZOFRAN-ODT) disintegrating tablet 4 mg    Sig:   . ondansetron (ZOFRAN-ODT) 4 MG disintegrating tablet    Sig: Take 1 tablet (4 mg total) by mouth once.    Dispense:  20 tablet    Refill:  0    Order Specific Question:  Supervising Provider    Answer:  Burnett Harry, DAVID [place], PA-C 10/03/15 [phone]

## 2015-11-21 ENCOUNTER — Emergency Department (INDEPENDENT_AMBULATORY_CARE_PROVIDER_SITE_OTHER)
Admission: EM | Admit: 2015-11-21 | Discharge: 2015-11-21 | Disposition: A | Discharge status: Home / Self Care | Payer: Managed Care, Other (non HMO) | Source: Home / Self Care | Attending: Family Medicine | Admitting: Family Medicine

## 2015-11-21 ENCOUNTER — Encounter: Payer: Self-pay | Admitting: Emergency Medicine

## 2015-11-21 DIAGNOSIS — J111 Influenza due to unidentified influenza virus with other respiratory manifestations: Secondary | ICD-10-CM

## 2015-11-21 DIAGNOSIS — R69 Illness, unspecified: Principal | ICD-10-CM

## 2015-11-21 MED ORDER — OSELTAMIVIR PHOSPHATE 75 MG PO CAPS: 75.0000 mg | ORAL_CAPSULE | Freq: Two times a day (BID) | ORAL | Status: DC

## 2015-11-21 MED ORDER — ALBUTEROL SULFATE HFA 108 (90 BASE) MCG/ACT IN AERS: 2.0000 | INHALATION_SPRAY | RESPIRATORY_TRACT | Status: DC | PRN

## 2015-11-21 NOTE — ED Notes (Addendum)
Patient reports onset of general aches, fever, dizziness, ear pain and sore throat. Took tylenol at 1100 today.

## 2015-11-21 NOTE — ED Provider Notes (Signed)
CSN: [id number]     Arrival date & time 11/21/15  1320 History   First MD Initiated Contact with Patient 11/21/15 1531     Chief Complaint  Patient presents with  . Generalized Body Aches  . Fever      HPI Comments: Complains of 2 day history flu-like illness including myalgias, headache, fever/chills, fatigue, and cough.  Also has mild nasal congestion and sore throat.  Cough is non-productive and somewhat worse at night.  No pleuritic pain but she has shortness of breath and wheezing with activity.  She has had a flu shot this season.  She has had mild nausea without vomiting.  She has a history of mild asthma normally controlled with Singulair and occasional use of albuterol MDI. She has a past history of pneumonia one year ago.  The history is provided by the patient.    Past Medical History  Diagnosis Date  . ADHD (attention deficit hyperactivity disorder)   . Depression   . Bipolar disorder ([place])   . Asthma    History reviewed. No pertinent past surgical history. Family History  Problem Relation Age of Onset  . Depression Mother   . Anxiety disorder Mother   . Depression Father   . Bipolar disorder Sister   . ADD / ADHD Sister    Social History  Substance Use Topics  . Smoking status: Never Smoker   . Smokeless tobacco: None  . Alcohol Use: No   OB History    No data available     Review of Systems + sore throat + hoarse + cough No pleuritic pain + wheezing + nasal congestion + post-nasal drainage No sinus pain/pressure No itchy/red eyes ? Earache + dizzy No hemoptysis + SOB with activity + fever, + chills + nausea No vomiting No abdominal pain No diarrhea No urinary symptoms No skin rash + fatigue + myalgias + headache Used OTC meds without relief  Allergies  Benadryl; Olanzapine; Penicillins; and Prozac  Home Medications   Prior to Admission medications   Medication Sig Start Date End Date Taking? Authorizing Provider  albuterol  (PROVENTIL HFA;VENTOLIN HFA) 108 (90 Base) MCG/ACT inhaler Inhale 2 puffs into the lungs every 4 (four) hours as needed for wheezing or shortness of breath. 11/21/15   Kandra Nicolas, MD  albuterol (PROVENTIL) (2.5 MG/3ML) 0.083% nebulizer solution Take 3 mLs (2.5 mg total) by nebulization every 4 (four) hours as needed for wheezing or shortness of breath. 01/09/14   Liam Graham, PA-C  ARIPiprazole (ABILIFY) 20 MG tablet Take 1 tablet (20 mg total) by mouth daily. 12/01/14   Merian Capron, MD  lamoTRIgine (LAMICTAL) 25 MG tablet Take one tablet daily for a week and then start taking 2 tablets. 12/01/14   Merian Capron, MD  levofloxacin (LEVAQUIN) 500 MG tablet Take 1 tablet (500 mg total) by mouth daily. 01/09/14   Liam Graham, PA-C  norgestimate-ethinyl estradiol (ORTHO-CYCLEN,SPRINTEC,PREVIFEM) 0.25-35 MG-MCG tablet  03/30/12   Historical Provider, MD  ondansetron (ZOFRAN-ODT) 4 MG disintegrating tablet Take 1 tablet (4 mg total) by mouth once. 10/03/15   Fransico Meadow, PA-C  oseltamivir (TAMIFLU) 75 MG capsule Take 1 capsule (75 mg total) by mouth every 12 (twelve) hours. 11/21/15   Kandra Nicolas, MD   Meds Ordered and Administered this Visit  Medications - No data to display  BP 112/79 mmHg  Pulse 92  Temp(Src) 98.4 F (36.9 C) (Oral)  Resp 18  Ht 5' 2.5" (1.588 m)  Wt  130 lb (58.968 kg)  BMI 23.38 kg/m2  SpO2 97% No data found.   Physical Exam Nursing notes and Vital Signs reviewed. Appearance:  Patient appears stated age, and in no acute distress Eyes:  Pupils are equal, round, and reactive to light and accomodation.  Extraocular movement is intact.  Conjunctivae are not inflamed  Ears:  Canals normal.  Tympanic membranes normal.  Nose:  Mildly congested turbinates.  No sinus tenderness.    Pharynx:  Normal Neck:  Supple.  Tender enlarged posterior nodes are palpated bilaterally  Lungs:  Clear to auscultation.  Breath sounds are equal.  Moving air well. Heart:  Regular rate  and rhythm without murmurs, rubs, or gallops.  Abdomen:  Nontender without masses or hepatosplenomegaly.  Bowel sounds are present.  No CVA or flank tenderness.  Extremities:  No edema.  Skin:  No rash present.   ED Course  Procedures  none  MDM   1. Influenza-like illness    Begin Tamiflu.  Rx for albuterol MDI  Take plain guaifenesin (1200mg  extended release tabs such as Mucinex) twice daily, with plenty of water, for cough and congestion.  May add Pseudoephedrine (30mg , one or two every 4 to 6 hours) for sinus congestion.  Get adequate rest.   May use Afrin nasal spray (or generic oxymetazoline) twice daily for about 5 days and then discontinue.  Also recommend using saline nasal spray several times daily and saline nasal irrigation (AYR is a common brand).   Try warm salt water gargles for sore throat.  Stop all antihistamines for now, and other non-prescription cough/cold preparations. May take Tylenol for pain, fever, etc. May take Delsym Cough Suppressant at bedtime for nighttime cough.  Follow-up with family doctor if not improving about 5 days.   Kandra Nicolas, MD 11/23/15 [phone]

## 2015-11-21 NOTE — Discharge Instructions (Signed)
Take plain guaifenesin (1200mg  extended release tabs such as Mucinex) twice daily, with plenty of water, for cough and congestion.  May add Pseudoephedrine (30mg , one or two every 4 to 6 hours) for sinus congestion.  Get adequate rest.   May use Afrin nasal spray (or generic oxymetazoline) twice daily for about 5 days and then discontinue.  Also recommend using saline nasal spray several times daily and saline nasal irrigation (AYR is a common brand).   Try warm salt water gargles for sore throat.  Stop all antihistamines for now, and other non-prescription cough/cold preparations. May take Tylenol for pain, fever, etc. May take Delsym Cough Suppressant at bedtime for nighttime cough.  Follow-up with family doctor if not improving about 5 days.   Influenza, Adult Influenza ("the flu") is a viral infection of the respiratory tract. It occurs more often in winter months because people spend more time in close contact with one another. Influenza can make you feel very sick. Influenza easily spreads from person to person (contagious). CAUSES  Influenza is caused by a virus that infects the respiratory tract. You can catch the virus by breathing in droplets from an infected person's cough or sneeze. You can also catch the virus by touching something that was recently contaminated with the virus and then touching your mouth, nose, or eyes. RISKS AND COMPLICATIONS You may be at risk for a more severe case of influenza if you smoke cigarettes, have diabetes, have chronic heart disease (such as heart failure) or lung disease (such as asthma), or if you have a weakened immune system. Elderly people and pregnant women are also at risk for more serious infections. The most common problem of influenza is a lung infection (pneumonia). Sometimes, this problem can require emergency medical care and may be life threatening. SIGNS AND SYMPTOMS  Symptoms typically last 4 to 10 days and may  include:  Fever.  Chills.  Headache, body aches, and muscle aches.  Sore throat.  Chest discomfort and cough.  Poor appetite.  Weakness or feeling tired.  Dizziness.  Nausea or vomiting. DIAGNOSIS  Diagnosis of influenza is often made based on your history and a physical exam. A nose or throat swab test can be done to confirm the diagnosis. TREATMENT  In mild cases, influenza goes away on its own. Treatment is directed at relieving symptoms. For more severe cases, your health care provider may prescribe antiviral medicines to shorten the sickness. Antibiotic medicines are not effective because the infection is caused by a virus, not by bacteria. HOME CARE INSTRUCTIONS  Take medicines only as directed by your health care provider.  Use a cool mist humidifier to make breathing easier.  Get plenty of rest until your temperature returns to normal. This usually takes 3 to 4 days.  Drink enough fluid to keep your urine clear or pale yellow.  Cover yourmouth and nosewhen coughing or sneezing,and wash your handswellto prevent thevirusfrom spreading.  Stay homefromwork orschool untilthe fever is gonefor at least 62full day. PREVENTION  An annual influenza vaccination (flu shot) is the best way to avoid getting influenza. An annual flu shot is now routinely recommended for all adults in the [place] IF:  You experiencechest pain, yourcough worsens,or you producemore mucus.  Youhave nausea,vomiting, ordiarrhea.  Your fever returns or gets worse. SEEK IMMEDIATE MEDICAL CARE IF:  You havetrouble breathing, you become short of breath,or your skin ornails becomebluish.  You have severe painor stiffnessin the neck.  You develop a sudden headache,  or pain in the face or ear.  You have nausea or vomiting that you cannot control. MAKE SURE YOU:   Understand these instructions.  Will watch your condition.  Will get help right away if you  are not doing well or get worse.   This information is not intended to replace advice given to you by your health care provider. Make sure you discuss any questions you have with your health care provider.   Document Released: 10/27/2000 Document Revised: 11/20/2014 Document Reviewed: 01/29/2012 Elsevier Interactive Patient Education [employer].

## 2017-09-07 ENCOUNTER — Emergency Department
Admission: EM | Admit: 2017-09-07 | Discharge: 2017-09-07 | Disposition: A | Discharge status: Home / Self Care | Payer: Self-pay | Source: Home / Self Care | Attending: Family Medicine | Admitting: Family Medicine

## 2017-09-07 ENCOUNTER — Encounter: Payer: Self-pay | Admitting: *Deleted

## 2017-09-07 DIAGNOSIS — R6889 Other general symptoms and signs: Secondary | ICD-10-CM

## 2017-09-07 HISTORY — DX: Anemia, unspecified: D64.9

## 2017-09-07 LAB — POCT RAPID STREP A (OFFICE): Rapid Strep A Screen: NEGATIVE

## 2017-09-07 MED ORDER — OSELTAMIVIR PHOSPHATE 75 MG PO CAPS: 75.0000 mg | ORAL_CAPSULE | Freq: Two times a day (BID) | ORAL | 0 refills | Status: DC

## 2017-09-07 NOTE — ED Triage Notes (Signed)
Patient c/o 36 hours of sore throat, aches, chills, fatigue, cough and HA. No flu vac this season.

## 2017-09-07 NOTE — Discharge Instructions (Signed)
°  You may take 500mg acetaminophen every 4-6 hours or in combination with ibuprofen 400-600mg every 6-8 hours as needed for pain, inflammation, and fever. ° °Be sure to drink at least eight 8oz glasses of water to stay well hydrated and get at least 8 hours of sleep at night, preferably more while sick.  ° °Oseltamivir (Tamiflu) may cause stomach upset including nausea, vomiting and diarrhea.  It may also cause dizziness or hallucinations in children.  To help prevent stomach upset, you may take this medication with food.  If you are still having unwanted symptoms, you may stop taking this medication as it is not as important to finish the entire course like antibiotics.  If you have questions/concerns please call our office or follow up with your primary care provider.   ° °

## 2017-09-07 NOTE — ED Provider Notes (Signed)
[name]    CSN: [id number] Arrival date & time: 09/07/17  0955     History   Chief Complaint Chief Complaint  Patient presents with  . Sore Throat  . Weakness  . Cough    HPI CRYSTALL DONALDSON is a 31 y.o. female.   HPI GLORIANA PILTZ is a 31 y.o. female presenting to UC with c/o 36 hours of sudden onset body aches, sore throat, chills, fatigue, mildly productive cough and generalized headache.  She is concerned she may have strep or the flu.  She has had similar symptoms in the past and was treated for flu despite negative flu tests in the past.  She notes the body aches and fatigue are most bothersome for her.  Denies n/v/d. No recorded fever. No known sick contacts but she does work in [employer] and is around [employer].   Past Medical History:  Diagnosis Date  . ADHD (attention deficit hyperactivity disorder)   . Anemia   . Asthma   . Bipolar disorder ([place])   . Depression     Patient Active Problem List   Diagnosis Date Noted  . ADHD (attention deficit hyperactivity disorder) 05/07/2012  . Bipolar 1 disorder ([place]) 05/07/2012    Past Surgical History:  Procedure Laterality Date  . NASAL SEPTUM SURGERY    . TONGUE FLAP      OB History    No data available       Home Medications    Prior to Admission medications   Medication Sig Start Date End Date Taking? Authorizing Provider  oseltamivir (TAMIFLU) 75 MG capsule Take 1 capsule (75 mg total) by mouth every 12 (twelve) hours. 09/07/17   Noe Gens, PA-C    Family History Family History  Problem Relation Age of Onset  . Depression Mother   . Anxiety disorder Mother   . Depression Father   . Bipolar disorder Sister   . ADD / ADHD Sister     Social History Social History  Substance Use Topics  . Smoking status: Never Smoker  . Smokeless tobacco: Never Used  . Alcohol use No     Allergies   Penicillins; Benadryl [diphenhydramine hcl]; Olanzapine; and Prozac  [fluoxetine hcl]   Review of Systems Review of Systems  Constitutional: Positive for chills and fatigue. Negative for fever.  HENT: Positive for congestion, rhinorrhea and sore throat. Negative for ear pain, trouble swallowing and voice change.   Respiratory: Positive for cough. Negative for shortness of breath.   Cardiovascular: Negative for chest pain and palpitations.  Gastrointestinal: Negative for abdominal pain, diarrhea, nausea and vomiting.  Musculoskeletal: Positive for arthralgias and myalgias. Negative for back pain.  Skin: Negative for rash.  Neurological: Positive for headaches. Negative for dizziness and light-headedness.     Physical Exam Triage Vital Signs ED Triage Vitals  Enc Vitals Group     BP 09/07/17 1031 127/89     Pulse Rate 09/07/17 1031 66     Resp 09/07/17 1031 16     Temp 09/07/17 1031 98.6 F (37 C)     Temp Source 09/07/17 1031 Oral     SpO2 09/07/17 1031 99 %     Weight 09/07/17 1032 132 lb (59.9 kg)     Height --      Head Circumference --      Peak Flow --      Pain Score 09/07/17 1032 4     Pain Loc --  Pain Edu? --      Excl. in [place]? --    No data found.   Updated Vital Signs BP 127/89 (BP Location: Left Arm)   Pulse 66   Temp 98.6 F (37 C) (Oral)   Resp 16   Wt 132 lb (59.9 kg)   LMP 08/24/2017   SpO2 99%   BMI 23.76 kg/m   Visual Acuity Right Eye Distance:   Left Eye Distance:   Bilateral Distance:    Right Eye Near:   Left Eye Near:    Bilateral Near:     Physical Exam  Constitutional: She is oriented to person, place, and time. She appears well-developed and well-nourished.  Pt bundle in her sweater, appears acutely ill but alert and cooperative   HENT:  Head: Normocephalic and atraumatic.  Right Ear: Tympanic membrane normal.  Left Ear: Tympanic membrane normal.  Nose: Mucosal edema present. Right sinus exhibits no maxillary sinus tenderness and no frontal sinus tenderness. Left sinus exhibits no maxillary  sinus tenderness and no frontal sinus tenderness.  Mouth/Throat: Uvula is midline and mucous membranes are normal. Posterior oropharyngeal erythema present. No oropharyngeal exudate, posterior oropharyngeal edema or tonsillar abscesses.  Eyes: EOM are normal.  Neck: Normal range of motion. Neck supple.  Cardiovascular: Normal rate and regular rhythm.   Pulmonary/Chest: Effort normal and breath sounds normal. No stridor. No respiratory distress. She has no wheezes. She has no rales.  Musculoskeletal: Normal range of motion.  Lymphadenopathy:    She has no cervical adenopathy.  Neurological: She is alert and oriented to person, place, and time.  Skin: Skin is warm and dry. She is not diaphoretic.  Psychiatric: She has a normal mood and affect. Her behavior is normal.  Nursing note and vitals reviewed.    UC Treatments / Results  Labs (all labs ordered are listed, but only abnormal results are displayed) Labs Reviewed  POCT RAPID STREP A (OFFICE)    EKG  EKG Interpretation None       Radiology No results found.  Procedures Procedures (including critical care time)  Medications Ordered in UC Medications - No data to display   Initial Impression / Assessment and Plan / UC Course  I have reviewed the triage vital signs and the nursing notes.  Pertinent labs & imaging results that were available during my care of the patient were reviewed by me and considered in my medical decision making (see chart for details).     Discussed testing for flu and strep.  Strep unlikely given pt has a cough, however, mother would like pt checked rather than having to come back later if not improving.  Rapid strep: Negative  Due to flu test not believe 100% accurate, will treat for influenza empirically with Tamiflu. Pt has had before and does well. Encouraged fluids, rest, acetaminophen and ibuprofen   Final Clinical Impressions(s) / UC Diagnoses   Final diagnoses:  Flu-like symptoms      New Prescriptions Current Discharge Medication List    START taking these medications   Details  oseltamivir (TAMIFLU) 75 MG capsule Take 1 capsule (75 mg total) by mouth every 12 (twelve) hours. Qty: 10 capsule, Refills: 0         Controlled Substance Prescriptions [place] Controlled Substance Registry consulted? Not Applicable   Tyrell Antonio 09/07/17 1113

## 2017-09-15 ENCOUNTER — Encounter: Payer: Self-pay | Admitting: Emergency Medicine

## 2017-09-15 ENCOUNTER — Emergency Department
Admission: EM | Admit: 2017-09-15 | Discharge: 2017-09-15 | Disposition: A | Discharge status: Home / Self Care | Payer: Self-pay | Source: Home / Self Care | Attending: Family Medicine | Admitting: Family Medicine

## 2017-09-15 DIAGNOSIS — N898 Other specified noninflammatory disorders of vagina: Secondary | ICD-10-CM

## 2017-09-15 DIAGNOSIS — Z202 Contact with and (suspected) exposure to infections with a predominantly sexual mode of transmission: Secondary | ICD-10-CM

## 2017-09-15 LAB — POCT URINALYSIS DIP (MANUAL ENTRY)
Bilirubin, UA: NEGATIVE
Blood, UA: NEGATIVE
Glucose, UA: NEGATIVE mg/dL
Ketones, POC UA: NEGATIVE mg/dL
Leukocytes, UA: NEGATIVE
Nitrite, UA: NEGATIVE
Spec Grav, UA: 1.015 (ref 1.010–1.025)
Urobilinogen, UA: 1 E.U./dL
pH, UA: 8.5 — AB (ref 5.0–8.0)

## 2017-09-15 NOTE — ED Notes (Signed)
Patient tolerated well.

## 2017-09-15 NOTE — ED Triage Notes (Signed)
Patient presents to [facility] requesting to be tested for STD. She states her husband is cheating on her.

## 2017-09-15 NOTE — ED Provider Notes (Signed)
[name]    CSN: [id number] Arrival date & time: 09/15/17  1138     History   Chief Complaint Chief Complaint  Patient presents with  . Exposure to STD    HPI TYLER ROBIDOUX is a 31 y.o. female.   HPI  JULIETT EASTBURN is a 31 y.o. female presenting to UC accompanied by a friend with c/o vaginal discharge and concern for STD exposure.  She reports finding her husband of 3 years allegedly cheating on her with a prostitute about 3 days ago.  Pt had been suspecting her husband was cheating throughout their marriage but did not know for "certain" until a few days ago.  She reports having abnormal yellowish discharge for about 1 week and mild burning, irritation when she urinates.  Denies fever, chills, n/v/d. Denies abdominal pain or back pain. Pt states her husband refuses to be tested.  They have had unprotected intercourse.  LMP: 08/24/17  Past Medical History:  Diagnosis Date  . ADHD (attention deficit hyperactivity disorder)   . Anemia   . Asthma   . Bipolar disorder ([place])   . Depression     Patient Active Problem List   Diagnosis Date Noted  . ADHD (attention deficit hyperactivity disorder) 05/07/2012  . Bipolar 1 disorder ([place]) 05/07/2012    Past Surgical History:  Procedure Laterality Date  . NASAL SEPTUM SURGERY    . TONGUE FLAP      OB History    No data available       Home Medications    Prior to Admission medications   Medication Sig Start Date End Date Taking? Authorizing Provider  oseltamivir (TAMIFLU) 75 MG capsule Take 1 capsule (75 mg total) by mouth every 12 (twelve) hours. 09/07/17   Noe Gens, PA-C    Family History Family History  Problem Relation Age of Onset  . Depression Mother   . Anxiety disorder Mother   . Depression Father   . Bipolar disorder Sister   . ADD / ADHD Sister     Social History Social History  Substance Use Topics  . Smoking status: Never Smoker  . Smokeless tobacco: Never Used  .  Alcohol use No     Allergies   Penicillins; Benadryl [diphenhydramine hcl]; Olanzapine; and Prozac [fluoxetine hcl]   Review of Systems Review of Systems  Constitutional: Negative for chills and fever.  Gastrointestinal: Negative for abdominal pain, diarrhea, nausea and vomiting.  Genitourinary: Positive for dysuria, vaginal discharge and vaginal pain. Negative for decreased urine volume, frequency, genital sores, hematuria, menstrual problem, pelvic pain, urgency and vaginal bleeding.  Musculoskeletal: Negative for back pain and myalgias.  Skin: Negative for rash.     Physical Exam Triage Vital Signs ED Triage Vitals [09/15/17 1159]  Enc Vitals Group     BP 136/87     Pulse Rate 81     Resp 16     Temp 98.4 F (36.9 C)     Temp Source Oral     SpO2 100 %     Weight 132 lb (59.9 kg)     Height 5\' 3"  (1.6 m)     Head Circumference      Peak Flow      Pain Score      Pain Loc      Pain Edu?      Excl. in [place]?    No data found.   Updated Vital Signs BP 136/87 (BP Location: Left Arm)  Pulse 81   Temp 98.4 F (36.9 C) (Oral)   Resp 16   Ht 5\' 3"  (1.6 m)   Wt 132 lb (59.9 kg)   LMP 08/24/2017   SpO2 100%   BMI 23.38 kg/m   Visual Acuity Right Eye Distance:   Left Eye Distance:   Bilateral Distance:    Right Eye Near:   Left Eye Near:    Bilateral Near:     Physical Exam  Constitutional: She is oriented to person, place, and time. She appears well-developed and well-nourished. No distress.  HENT:  Head: Normocephalic and atraumatic.  Eyes: EOM are normal.  Neck: Normal range of motion.  Cardiovascular: Normal rate and regular rhythm.   Pulmonary/Chest: Effort normal. No respiratory distress.  Abdominal: Soft. She exhibits no distension. There is no tenderness.  Genitourinary:  Genitourinary Comments: Chaperoned exam. Normal external genitalia. Vaginal canal- small amount of white-yellow discharge. No bleeding. No CMT, adnexal tenderness or masses.     Musculoskeletal: Normal range of motion.  Neurological: She is alert and oriented to person, place, and time.  Skin: Skin is warm and dry. She is not diaphoretic.  Psychiatric: She has a normal mood and affect. Her behavior is normal.  Nursing note and vitals reviewed.    UC Treatments / Results  Labs (all labs ordered are listed, but only abnormal results are displayed) Labs Reviewed  POCT URINALYSIS DIP (MANUAL ENTRY) - Abnormal; Notable for the following:       Result Value   pH, UA 8.5 (*)    Protein Ur, POC trace (*)    All other components within normal limits  WET PREP BY MOLECULAR PROBE  GC/CHLAMYDIA PROBE AMP  RPR  HIV ANTIBODY (ROUTINE TESTING)    EKG  EKG Interpretation None       Radiology No results found.  Procedures Procedures (including critical care time)  Medications Ordered in UC Medications - No data to display   Initial Impression / Assessment and Plan / UC Course  I have reviewed the triage vital signs and the nursing notes.  Pertinent labs & imaging results that were available during my care of the patient were reviewed by me and considered in my medical decision making (see chart for details).     Pt concerned about STD exposure due to recent discovery of her husband allegedly cheating on her. Pt c/o vaginal discharge. UA: unremarkable Swab sent to lab for GC/chlamydia and Wet Prep Blood work sent to lab for HIV and Syphilis F/u with PCP, [facility] [place] or [facility] Department as needed. Pt will be notified of results even if normal. Pt info packet provided. Will hold off on empiric treatment until labs result.    Final Clinical Impressions(s) / UC Diagnoses   Final diagnoses:  Possible exposure to STD  Vaginal discharge    New Prescriptions Discharge Medication List as of 09/15/2017 12:38 PM       Controlled Substance Prescriptions [place] Controlled Substance Registry consulted? Not  Applicable   Tyrell Antonio 09/15/17 1258

## 2017-09-15 NOTE — ED Notes (Signed)
Patients password for test results Lucy 41

## 2017-09-15 NOTE — Discharge Instructions (Signed)
°  Your tests results should come back in about 24-72 hours.  If you do not hear back from our clinic by Tuesday, please give [place] a call to check on the status as you should be notified even if all your tests are normal.  Please avoid sexual activity until tests result.  If any come back positive, you will be prescribed the correct medication to help treat your symptoms.  If positive, please avoid sexual activities until at least 7 days and your symptoms have resolved. Always use condoms to help prevent infection and unplanned pregnancies.

## 2017-09-16 LAB — WET PREP BY MOLECULAR PROBE
Candida species: NOT DETECTED
Gardnerella vaginalis: NOT DETECTED
MICRO NUMBER:: 81238422
SPECIMEN QUALITY:: ADEQUATE
Trichomonas vaginosis: NOT DETECTED

## 2017-09-17 LAB — RPR: RPR Ser Ql: NONREACTIVE

## 2017-09-17 LAB — HIV ANTIBODY (ROUTINE TESTING W REFLEX): HIV 1&2 Ab, 4th Generation: NONREACTIVE

## 2017-09-18 ENCOUNTER — Telehealth: Payer: Self-pay | Admitting: *Deleted

## 2017-09-18 LAB — C. TRACHOMATIS/N. GONORRHOEAE RNA
C. trachomatis RNA, TMA: NOT DETECTED
N. gonorrhoeae RNA, TMA: NOT DETECTED

## 2017-09-18 NOTE — Telephone Encounter (Signed)
Spoke to pt password verified and lab results given.

## 2017-11-21 ENCOUNTER — Emergency Department
Admission: EM | Admit: 2017-11-21 | Discharge: 2017-11-21 | Disposition: A | Discharge status: Home / Self Care | Payer: Self-pay | Source: Home / Self Care | Attending: Family Medicine | Admitting: Family Medicine

## 2017-11-21 ENCOUNTER — Other Ambulatory Visit: Payer: Self-pay

## 2017-11-21 ENCOUNTER — Encounter: Payer: Self-pay | Admitting: *Deleted

## 2017-11-21 DIAGNOSIS — Z20828 Contact with and (suspected) exposure to other viral communicable diseases: Secondary | ICD-10-CM

## 2017-11-21 DIAGNOSIS — R52 Pain, unspecified: Secondary | ICD-10-CM

## 2017-11-21 DIAGNOSIS — R6889 Other general symptoms and signs: Secondary | ICD-10-CM

## 2017-11-21 DIAGNOSIS — R112 Nausea with vomiting, unspecified: Secondary | ICD-10-CM

## 2017-11-21 DIAGNOSIS — R197 Diarrhea, unspecified: Secondary | ICD-10-CM

## 2017-11-21 MED ORDER — OSELTAMIVIR PHOSPHATE 75 MG PO CAPS: 75.0000 mg | ORAL_CAPSULE | Freq: Two times a day (BID) | ORAL | 0 refills | Status: DC

## 2017-11-21 MED ORDER — ONDANSETRON 4 MG PO TBDP
4.0000 mg | ORAL_TABLET | Freq: Once | ORAL | Status: AC
  Administered 2017-11-21: 4 mg via ORAL

## 2017-11-21 MED ORDER — ONDANSETRON HCL 4 MG PO TABS: 4.0000 mg | ORAL_TABLET | Freq: Four times a day (QID) | ORAL | 0 refills | Status: DC

## 2017-11-21 NOTE — Discharge Instructions (Signed)
°  You may take 500mg acetaminophen every 4-6 hours or in combination with ibuprofen 400-600mg every 6-8 hours as needed for pain, inflammation, and fever. ° °Be sure to drink at least eight 8oz glasses of water to stay well hydrated and get at least 8 hours of sleep at night, preferably more while sick.  ° °Oseltamivir (Tamiflu) may cause stomach upset including nausea, vomiting and diarrhea.  It may also cause dizziness or hallucinations in children.  To help prevent stomach upset, you may take this medication with food.  If you are still having unwanted symptoms, you may stop taking this medication as it is not as important to finish the entire course like antibiotics.  If you have questions/concerns please call our office or follow up with your primary care provider.   ° °

## 2017-11-21 NOTE — ED Provider Notes (Signed)
[name]    CSN: [id number] Arrival date & time: 11/21/17  1217     History   Chief Complaint Chief Complaint  Patient presents with  . Diarrhea  . Emesis    HPI Rachel [name] is a 32 y.o. female.   HPI Rachel [name] is a 32 y.o. female presenting to UC with c/o vomiting, diarrhea, nasal congestion, generalized HA, body aches and chills for the last 2 days.  She has taken Tylenol, last dose at 10:30AM this morning.  Denies known fever. Her sister did have the flu around Christmas about 2 weeks ago and another family member has been sick recently. Pt has had the flu before and notes this feels similar.    Past Medical History:  Diagnosis Date  . ADHD (attention deficit hyperactivity disorder)   . Anemia   . Asthma   . Bipolar disorder ([place])   . Depression     Patient Active Problem List   Diagnosis Date Noted  . ADHD (attention deficit hyperactivity disorder) 05/07/2012  . Bipolar 1 disorder ([place]) 05/07/2012    Past Surgical History:  Procedure Laterality Date  . NASAL SEPTUM SURGERY    . TONGUE FLAP      OB History    No data available       Home Medications    Prior to Admission medications   Medication Sig Start Date End Date Taking? Authorizing Provider  ondansetron (ZOFRAN) 4 MG tablet Take 1 tablet (4 mg total) by mouth every 6 (six) hours. 11/21/17   Noe Gens, PA-C  oseltamivir (TAMIFLU) 75 MG capsule Take 1 capsule (75 mg total) by mouth every 12 (twelve) hours. 11/21/17   Noe Gens, PA-C    Family History Family History  Problem Relation Age of Onset  . Depression Mother   . Anxiety disorder Mother   . Depression Father   . Bipolar disorder Sister   . ADD / ADHD Sister     Social History Social History   Tobacco Use  . Smoking status: Never Smoker  . Smokeless tobacco: Never Used  Substance Use Topics  . Alcohol use: No  . Drug use: No     Allergies   Penicillins; Benadryl [diphenhydramine hcl];  Doxycycline; Olanzapine; Other; and Prozac [fluoxetine hcl]   Review of Systems Review of Systems  Constitutional: Positive for appetite change, chills and fatigue. Negative for fever.  HENT: Positive for congestion and rhinorrhea. Negative for ear pain, sore throat, trouble swallowing and voice change.   Respiratory: Positive for cough ( minimal). Negative for shortness of breath.   Cardiovascular: Negative for chest pain and palpitations.  Gastrointestinal: Positive for diarrhea, nausea and vomiting. Negative for abdominal pain.  Musculoskeletal: Positive for arthralgias, back pain and myalgias.  Skin: Negative for rash.  Neurological: Positive for headaches. Negative for dizziness and light-headedness.     Physical Exam Triage Vital Signs ED Triage Vitals [11/21/17 1251]  Enc Vitals Group     BP 121/87     Pulse Rate 74     Resp 16     Temp 98 F (36.7 C)     Temp Source Oral     SpO2 98 %     Weight      Height      Head Circumference      Peak Flow      Pain Score      Pain Loc      Pain Edu?  Excl. in [place]?    No data found.  Updated Vital Signs BP 121/87 (BP Location: Right Arm)   Pulse 74   Temp 98 F (36.7 C) (Oral)   Resp 16   Ht 5\' 3"  (1.6 m)   Wt 128 lb (58.1 kg)   LMP 11/21/2017   SpO2 98%   BMI 22.67 kg/m  Physical Exam  Constitutional: She is oriented to person, place, and time. She appears well-developed and well-nourished. No distress.  Pt lying on exam bed, appears acutely ill but is alert and cooperative during exam.  HENT:  Head: Normocephalic and atraumatic.  Right Ear: Tympanic membrane normal.  Left Ear: Tympanic membrane normal.  Nose: Mucosal edema present. Right sinus exhibits no maxillary sinus tenderness and no frontal sinus tenderness. Left sinus exhibits no maxillary sinus tenderness and no frontal sinus tenderness.  Mouth/Throat: Uvula is midline, oropharynx is clear and moist and mucous membranes are normal.  Eyes: EOM are  normal.  Neck: Normal range of motion. Neck supple.  Cardiovascular: Normal rate and regular rhythm.  Pulmonary/Chest: Effort normal and breath sounds normal. No stridor. No respiratory distress. She has no wheezes. She has no rales.  Abdominal: Soft. She exhibits no distension. There is no tenderness. There is no CVA tenderness.  Musculoskeletal: Normal range of motion.  Neurological: She is alert and oriented to person, place, and time.  Skin: Skin is warm and dry. She is not diaphoretic.  Psychiatric: She has a normal mood and affect. Her behavior is normal.  Nursing note and vitals reviewed.    UC Treatments / Results  Labs (all labs ordered are listed, but only abnormal results are displayed) Labs Reviewed - No data to display  EKG  EKG Interpretation None       Radiology No results found.  Procedures Procedures (including critical care time)  Medications Ordered in UC Medications  ondansetron (ZOFRAN-ODT) disintegrating tablet 4 mg (4 mg Oral Given 11/21/17 1255)     Initial Impression / Assessment and Plan / UC Course  I have reviewed the triage vital signs and the nursing notes.  Pertinent labs & imaging results that were available during my care of the patient were reviewed by me and considered in my medical decision making (see chart for details).     Hx and exam c/w influenza Discussed empiric treatment given symptoms and known exposure. Pt would like to start Tamiflu. She has had in the past and does well Home care instructions provided F/u  With PCP in 5-7 days if not improving, sooner if significantly worsening.   Final Clinical Impressions(s) / UC Diagnoses   Final diagnoses:  Flu-like symptoms  Nausea vomiting and diarrhea  Body aches  Exposure to the flu    ED Discharge Orders        Ordered    oseltamivir (TAMIFLU) 75 MG capsule  Every 12 hours     11/21/17 1255    ondansetron (ZOFRAN) 4 MG tablet  Every 6 hours     11/21/17 1255        Controlled Substance Prescriptions  Controlled Substance Registry consulted? Not Applicable   Tyrell Antonio 11/21/17 1544

## 2017-11-21 NOTE — ED Triage Notes (Signed)
Pt c/o vomiting, diarrhea, nasal congestion, HA, body aches and chills x 2 days. Denies fever. Last dose Tylenol 1030 today.

## 2017-12-26 DIAGNOSIS — K219 Gastro-esophageal reflux disease without esophagitis: Secondary | ICD-10-CM | POA: Insufficient documentation

## 2018-01-07 ENCOUNTER — Other Ambulatory Visit: Payer: Self-pay

## 2018-01-07 ENCOUNTER — Encounter: Payer: Self-pay | Admitting: *Deleted

## 2018-01-07 ENCOUNTER — Emergency Department
Admission: EM | Admit: 2018-01-07 | Discharge: 2018-01-07 | Disposition: A | Discharge status: Home / Self Care | Payer: Self-pay | Source: Home / Self Care | Attending: Family Medicine | Admitting: Family Medicine

## 2018-01-07 DIAGNOSIS — J9801 Acute bronchospasm: Secondary | ICD-10-CM

## 2018-01-07 DIAGNOSIS — R69 Illness, unspecified: Secondary | ICD-10-CM

## 2018-01-07 DIAGNOSIS — J111 Influenza due to unidentified influenza virus with other respiratory manifestations: Secondary | ICD-10-CM

## 2018-01-07 HISTORY — DX: Gastro-esophageal reflux disease without esophagitis: K21.9

## 2018-01-07 MED ORDER — PREDNISONE 20 MG PO TABS: ORAL_TABLET | ORAL | 0 refills | Status: DC

## 2018-01-07 MED ORDER — OSELTAMIVIR PHOSPHATE 75 MG PO CAPS: 75.0000 mg | ORAL_CAPSULE | Freq: Two times a day (BID) | ORAL | 0 refills | Status: DC

## 2018-01-07 NOTE — ED Provider Notes (Signed)
[name]    CSN: [id number] Arrival date & time: 01/07/18  0857     History   Chief Complaint Chief Complaint  Patient presents with  . Generalized Body Aches  . Fever    HPI Rachel [name] is a 32 y.o. female.   Patient awoke yesterday with flu-like illness including myalgias, headache, fever/chills, fatigue, and cough.  Also has mild nasal congestion and sore throat.  Cough is non-productive and somewhat worse at night.  No pleuritic pain but she complains of tightness In her anterior chest.  She has mild asthma and has had to use her albuterol MDI.    She has had mild nausea with an episode of vomiting and diarrhea, resolved.   The history is provided by the patient.    Past Medical History:  Diagnosis Date  . ADHD (attention deficit hyperactivity disorder)   . Anemia   . Asthma   . Bipolar disorder ([place])   . Depression   . GERD (gastroesophageal reflux disease)     Patient Active Problem List   Diagnosis Date Noted  . ADHD (attention deficit hyperactivity disorder) 05/07/2012  . Bipolar 1 disorder ([place]) 05/07/2012    Past Surgical History:  Procedure Laterality Date  . NASAL SEPTUM SURGERY    . TONGUE FLAP    . WISDOM TOOTH EXTRACTION      OB History    No data available       Home Medications    Prior to Admission medications   Medication Sig Start Date End Date Taking? Authorizing Provider  Ascorbic Acid (VITAMIN C) 1000 MG tablet Take 1,000 mg by mouth daily.   Yes [provider]  oseltamivir (TAMIFLU) 75 MG capsule Take 1 capsule (75 mg total) by mouth every 12 (twelve) hours. 01/07/18   Kandra Nicolas, MD  predniSONE (DELTASONE) 20 MG tablet Take one tab by mouth twice daily for 4 days, then one daily for 3 days. Take with food. 01/07/18   Kandra Nicolas, MD    Family History Family History  Problem Relation Age of Onset  . Depression Mother   . Anxiety disorder Mother   . Hypertension Mother   .  Hyperlipidemia Mother   . Asthma Mother   . Depression Father   . Hypertension Father   . Hyperlipidemia Father   . Bipolar disorder Father   . Bipolar disorder Sister   . ADD / ADHD Sister   . Asthma Sister     Social History Social History   Tobacco Use  . Smoking status: Never Smoker  . Smokeless tobacco: Never Used  Substance Use Topics  . Alcohol use: No  . Drug use: No     Allergies   Penicillins; Benadryl [diphenhydramine hcl]; Doxycycline; Olanzapine; Other; and Prozac [fluoxetine hcl]   Review of Systems Review of Systems + sore throat + cough No pleuritic pain, but feels tight in anterior chest + wheezing + nasal congestion + post-nasal drainage No sinus pain/pressure No itchy/red eyes No earache No hemoptysis + SOB + fever, + chills + nausea + vomiting, resolved No abdominal pain + diarrhea, resolved No urinary symptoms No skin rash + fatigue + myalgias + headache Used OTC meds without relief   Physical Exam Triage Vital Signs ED Triage Vitals [01/07/18 0924]  Enc Vitals Group     BP 120/81     Pulse Rate 88     Resp 16     Temp 99.4 F (37.4 C)  Temp Source Oral     SpO2 99 %     Weight 129 lb (58.5 kg)     Height 5\' 3"  (1.6 m)     Head Circumference      Peak Flow      Pain Score 0     Pain Loc      Pain Edu?      Excl. in [place]?    No data found.  Updated Vital Signs BP 120/81 (BP Location: Right Arm)   Pulse 88   Temp 99.4 F (37.4 C) (Oral)   Resp 16   Ht 5\' 3"  (1.6 m)   Wt 129 lb (58.5 kg)   LMP 12/03/2017   SpO2 99%   BMI 22.85 kg/m   Visual Acuity Right Eye Distance:   Left Eye Distance:   Bilateral Distance:    Right Eye Near:   Left Eye Near:    Bilateral Near:     Physical Exam Nursing notes and Vital Signs reviewed. Appearance:  Patient appears stated age, and in no acute distress Eyes:  Pupils are equal, round, and reactive to light and accomodation.  Extraocular movement is intact.   Conjunctivae are not inflamed  Ears:  Canals normal.  Tympanic membranes normal.  Nose:  Mildly congested turbinates.  No sinus tenderness.  Pharynx:  Normal Neck:  Supple.  Enlarged posterior/lateral nodes are palpated bilaterally, tender to palpation on the left.   Lungs:  Clear to auscultation.  Breath sounds are equal.  Moving air well. Heart:  Regular rate and rhythm without murmurs, rubs, or gallops.  Abdomen:  Nontender without masses or hepatosplenomegaly.  Bowel sounds are present.  No CVA or flank tenderness.  Extremities:  No edema.  Skin:  No rash present.    UC Treatments / Results  Labs (all labs ordered are listed, but only abnormal results are displayed) Labs Reviewed - No data to display  EKG  EKG Interpretation None       Radiology No results found.  Procedures Procedures (including critical care time)  Medications Ordered in UC Medications - No data to display   Initial Impression / Assessment and Plan / UC Course  I have reviewed the triage vital signs and the nursing notes.  Pertinent labs & imaging results that were available during my care of the patient were reviewed by me and considered in my medical decision making (see chart for details).    Patient concerned about possible strep; however CENTOR 1.  No strep test indicated. Begin empiric Tamiflu, and prednisone burst/taper. Take plain guaifenesin (1200mg  extended release tabs such as Mucinex) twice daily, with plenty of water, for cough and congestion.  May add Pseudoephedrine (30mg , one or two every 4 to 6 hours) for sinus congestion.  Get adequate rest.   May use Afrin nasal spray (or generic oxymetazoline) each morning for about 5 days and then discontinue.  Also recommend using saline nasal spray several times daily and saline nasal irrigation (AYR is a common brand).  Use Flonase nasal spray each morning after using Afrin nasal spray and saline nasal irrigation. Try warm salt water gargles  for sore throat.  Continue albuterol inhaler and albuterol by nebulizer as needed. May take Delsym Cough Suppressant at bedtime for nighttime cough.  Stop all antihistamines for now, and other non-prescription cough/cold preparations. May take Tylenol for fever, headache, body aches, etc. Followup with Family Doctor if not improved in one week.     Final Clinical Impressions(s) /  UC Diagnoses   Final diagnoses:  Influenza-like illness  Bronchospasm    ED Discharge Orders        Ordered    oseltamivir (TAMIFLU) 75 MG capsule  Every 12 hours     01/07/18 1036    predniSONE (DELTASONE) 20 MG tablet     01/07/18 1036           Kandra Nicolas, MD 01/07/18 1044

## 2018-01-07 NOTE — ED Triage Notes (Signed)
Pt c/o body aches, fever, diarrhea, nausea, chills, vomit x 1 , and hoarseness x 1 day. Last dose tylenol and IBF at 0800.

## 2018-01-07 NOTE — Discharge Instructions (Signed)
Take plain guaifenesin (1200mg  extended release tabs such as Mucinex) twice daily, with plenty of water, for cough and congestion.  May add Pseudoephedrine (30mg , one or two every 4 to 6 hours) for sinus congestion.  Get adequate rest.   May use Afrin nasal spray (or generic oxymetazoline) each morning for about 5 days and then discontinue.  Also recommend using saline nasal spray several times daily and saline nasal irrigation (AYR is a common brand).  Use Flonase nasal spray each morning after using Afrin nasal spray and saline nasal irrigation. Try warm salt water gargles for sore throat.  Continue albuterol inhaler and albuterol by nebulizer as needed. May take Delsym Cough Suppressant at bedtime for nighttime cough.  Stop all antihistamines for now, and other non-prescription cough/cold preparations. May take Tylenol for fever, headache, body aches, etc.

## 2018-01-09 ENCOUNTER — Telehealth: Payer: Self-pay | Admitting: Emergency Medicine

## 2018-01-09 NOTE — Telephone Encounter (Signed)
Patient calling to request extention of LOA; she is improving but does not feel well enough to RTW. No fever; taking Tamiflu.

## 2018-03-13 DIAGNOSIS — M6281 Muscle weakness (generalized): Secondary | ICD-10-CM | POA: Insufficient documentation

## 2018-05-31 DIAGNOSIS — M5412 Radiculopathy, cervical region: Secondary | ICD-10-CM | POA: Insufficient documentation

## 2018-05-31 DIAGNOSIS — R208 Other disturbances of skin sensation: Secondary | ICD-10-CM | POA: Insufficient documentation

## 2018-10-14 ENCOUNTER — Ambulatory Visit (HOSPITAL_COMMUNITY)
Admission: EM | Admit: 2018-10-14 | Discharge: 2018-10-14 | Disposition: A | Discharge status: Home / Self Care | Payer: Medicaid Other | Attending: Family Medicine | Admitting: Family Medicine

## 2018-10-14 ENCOUNTER — Other Ambulatory Visit: Payer: Self-pay

## 2018-10-14 ENCOUNTER — Encounter (HOSPITAL_COMMUNITY): Payer: Self-pay | Admitting: Emergency Medicine

## 2018-10-14 DIAGNOSIS — J111 Influenza due to unidentified influenza virus with other respiratory manifestations: Secondary | ICD-10-CM | POA: Diagnosis not present

## 2018-10-14 DIAGNOSIS — R69 Illness, unspecified: Secondary | ICD-10-CM

## 2018-10-14 DIAGNOSIS — J029 Acute pharyngitis, unspecified: Secondary | ICD-10-CM | POA: Diagnosis present

## 2018-10-14 DIAGNOSIS — R197 Diarrhea, unspecified: Secondary | ICD-10-CM | POA: Diagnosis present

## 2018-10-14 LAB — POCT RAPID STREP A: STREPTOCOCCUS, GROUP A SCREEN (DIRECT): NEGATIVE

## 2018-10-14 MED ORDER — OSELTAMIVIR PHOSPHATE 75 MG PO CAPS: 75.0000 mg | ORAL_CAPSULE | Freq: Two times a day (BID) | ORAL | 0 refills | Status: DC

## 2018-10-14 NOTE — ED Provider Notes (Signed)
[facility]    CSN: [id number] Arrival date & time: 10/14/18  1736     History   Chief Complaint Chief Complaint  Patient presents with  . Generalized Body Aches  . Sore Throat  . Fatigue  . Diarrhea    HPI Rachel [name] is a 32 y.o. female.   Patient is a 32 year old female that presents for flulike symptoms.  They started last night but became initially worse today.  She has had fever, body aches, night sweats, sore throat, fatigue, cough, congestion, diarrhea.  She has been taking Aleve and Tylenol around-the-clock.  Denies any recent sick contacts.  Reports that she is prone to strep and flu.  Was diagnosed and treated with flu earlier this year.  ROS per HPI      Past Medical History:  Diagnosis Date  . ADHD (attention deficit hyperactivity disorder)   . Anemia   . Asthma   . Bipolar disorder ([place])   . Depression   . GERD (gastroesophageal reflux disease)     Patient Active Problem List   Diagnosis Date Noted  . ADHD (attention deficit hyperactivity disorder) 05/07/2012  . Bipolar 1 disorder ([place]) 05/07/2012    Past Surgical History:  Procedure Laterality Date  . NASAL SEPTUM SURGERY    . TONGUE FLAP    . WISDOM TOOTH EXTRACTION      OB History   None      Home Medications    Prior to Admission medications   Medication Sig Start Date End Date Taking? Authorizing Provider  Ascorbic Acid (VITAMIN C) 1000 MG tablet Take 1,000 mg by mouth daily.    [provider]  oseltamivir (TAMIFLU) 75 MG capsule Take 1 capsule (75 mg total) by mouth every 12 (twelve) hours. 10/14/18   Loura Halt A, NP  predniSONE (DELTASONE) 20 MG tablet Take one tab by mouth twice daily for 4 days, then one daily for 3 days. Take with food. 01/07/18   Kandra Nicolas, MD    Family History Family History  Problem Relation Age of Onset  . Depression Mother   . Anxiety disorder Mother   . Hypertension Mother   . Hyperlipidemia Mother   . Asthma  Mother   . Depression Father   . Hypertension Father   . Hyperlipidemia Father   . Bipolar disorder Father   . Bipolar disorder Sister   . ADD / ADHD Sister   . Asthma Sister     Social History Social History   Tobacco Use  . Smoking status: Never Smoker  . Smokeless tobacco: Never Used  Substance Use Topics  . Alcohol use: No  . Drug use: No     Allergies   Penicillins; Benadryl [diphenhydramine hcl]; Doxycycline; Olanzapine; Other; and Prozac [fluoxetine hcl]   Review of Systems Review of Systems   Physical Exam Triage Vital Signs ED Triage Vitals  Enc Vitals Group     BP 10/14/18 1852 124/84     Pulse Rate 10/14/18 1852 84     Resp 10/14/18 1852 18     Temp 10/14/18 1852 98.1 F (36.7 C)     Temp Source 10/14/18 1852 Oral     SpO2 10/14/18 1852 100 %     Weight --      Height --      Head Circumference --      Peak Flow --      Pain Score 10/14/18 1859 8     Pain Loc --  Pain Edu? --      Excl. in [place]? --    No data found.  Updated Vital Signs BP 124/84 (BP Location: Left Arm)   Pulse 84   Temp 98.1 F (36.7 C) (Oral)   Resp 18   LMP 10/11/2018   SpO2 100%   Visual Acuity Right Eye Distance:   Left Eye Distance:   Bilateral Distance:    Right Eye Near:   Left Eye Near:    Bilateral Near:     Physical Exam  Constitutional: She appears well-developed and well-nourished.  HENT:  Head: Normocephalic.  Right Ear: Hearing, tympanic membrane and ear canal normal.  Left Ear: Hearing, tympanic membrane and ear canal normal.  Mouth/Throat: Mucous membranes are normal. Posterior oropharyngeal erythema present. Tonsils are 1+ on the right. Tonsils are 1+ on the left. No tonsillar exudate.  Neck: Normal range of motion.  Pulmonary/Chest: Effort normal and breath sounds normal.  Lymphadenopathy:    She has no cervical adenopathy.  Neurological: She is alert.  Skin: Skin is warm and dry.  Psychiatric: She has a normal mood and affect.    Nursing note and vitals reviewed.    UC Treatments / Results  Labs (all labs ordered are listed, but only abnormal results are displayed) Labs Reviewed  CULTURE, GROUP A STREP [facility])  POCT RAPID STREP A    EKG None  Radiology No results found.  Procedures Procedures (including critical care time)  Medications Ordered in UC Medications - No data to display  Initial Impression / Assessment and Plan / UC Course  I have reviewed the triage vital signs and the nursing notes.  Pertinent labs & imaging results that were available during my care of the patient were reviewed by me and considered in my medical decision making (see chart for details).     Rapid strep negative We will cycle flulike illness based on symptoms Will treat with Tamiflu as requested Follow up as needed for continued or worsening symptoms  Final Clinical Impressions(s) / UC Diagnoses   Final diagnoses:  Influenza-like illness     Discharge Instructions     Rapid strep test was negative We will treat you for flulike illness with Tamiflu Follow-up with your doctor for continued or worsening symptoms    ED Prescriptions    Medication Sig Dispense Auth. Provider   oseltamivir (TAMIFLU) 75 MG capsule Take 1 capsule (75 mg total) by mouth every 12 (twelve) hours. 10 capsule Loura Halt A, NP     Controlled Substance Prescriptions [place] Controlled Substance Registry consulted? Not Applicable   Orvan July, NP 10/14/18 2005

## 2018-10-14 NOTE — Discharge Instructions (Addendum)
Rapid strep test was negative We will treat you for flulike illness with Tamiflu Follow-up with your doctor for continued or worsening symptoms

## 2018-10-14 NOTE — ED Triage Notes (Addendum)
Pt reports body aches, sore throat, hurts to talk, diarrhea, and fatigue that started yesterday.  Pt states she has been alternating Advil and Tylenol all day today.  Her last dose was Advil around 1730 today.

## 2018-10-17 LAB — CULTURE, GROUP A STREP (THRC)

## 2019-03-28 ENCOUNTER — Other Ambulatory Visit: Payer: Self-pay

## 2019-03-28 ENCOUNTER — Emergency Department (HOSPITAL_COMMUNITY)
Admission: EM | Admit: 2019-03-28 | Discharge: 2019-03-28 | Disposition: A | Discharge status: Home / Self Care | Payer: Medicaid Other | Attending: Emergency Medicine | Admitting: Emergency Medicine

## 2019-03-28 DIAGNOSIS — Z79899 Other long term (current) drug therapy: Secondary | ICD-10-CM | POA: Diagnosis not present

## 2019-03-28 DIAGNOSIS — B829 Intestinal parasitism, unspecified: Secondary | ICD-10-CM | POA: Insufficient documentation

## 2019-03-28 DIAGNOSIS — R634 Abnormal weight loss: Secondary | ICD-10-CM

## 2019-03-28 DIAGNOSIS — F909 Attention-deficit hyperactivity disorder, unspecified type: Secondary | ICD-10-CM | POA: Diagnosis not present

## 2019-03-28 DIAGNOSIS — R1030 Lower abdominal pain, unspecified: Secondary | ICD-10-CM | POA: Diagnosis present

## 2019-03-28 DIAGNOSIS — F319 Bipolar disorder, unspecified: Secondary | ICD-10-CM | POA: Insufficient documentation

## 2019-03-28 DIAGNOSIS — B820 Intestinal helminthiasis, unspecified: Secondary | ICD-10-CM

## 2019-03-28 LAB — CBC
HCT: 39.5 % (ref 36.0–46.0)
Hemoglobin: 13.2 g/dL (ref 12.0–15.0)
MCH: 31.6 pg (ref 26.0–34.0)
MCHC: 33.4 g/dL (ref 30.0–36.0)
MCV: 94.5 fL (ref 80.0–100.0)
Platelets: 296 10*3/uL (ref 150–400)
RBC: 4.18 MIL/uL (ref 3.87–5.11)
RDW: 12.7 % (ref 11.5–15.5)
WBC: 5.4 10*3/uL (ref 4.0–10.5)
nRBC: 0 % (ref 0.0–0.2)

## 2019-03-28 LAB — COMPREHENSIVE METABOLIC PANEL
ALT: 9 U/L (ref 0–44)
AST: 14 U/L — ABNORMAL LOW (ref 15–41)
Albumin: 4.1 g/dL (ref 3.5–5.0)
Alkaline Phosphatase: 42 U/L (ref 38–126)
Anion gap: 7 (ref 5–15)
BUN: 9 mg/dL (ref 6–20)
CO2: 27 mmol/L (ref 22–32)
Calcium: 9.6 mg/dL (ref 8.9–10.3)
Chloride: 108 mmol/L (ref 98–111)
Creatinine, Ser: 0.62 mg/dL (ref 0.44–1.00)
GFR calc Af Amer: >60 mL/min (ref >60)
GFR calc non Af Amer: >60 mL/min (ref >60)
Glucose, Bld: 95 mg/dL (ref 70–99)
Potassium: 4 mmol/L (ref 3.5–5.1)
Sodium: 142 mmol/L (ref 135–145)
Total Bilirubin: 0.6 mg/dL (ref 0.3–1.2)
Total Protein: 6.8 g/dL (ref 6.5–8.1)

## 2019-03-28 LAB — URINALYSIS, ROUTINE W REFLEX MICROSCOPIC
Bilirubin Urine: NEGATIVE
Glucose, UA: NEGATIVE mg/dL
Hgb urine dipstick: NEGATIVE
Ketones, ur: NEGATIVE mg/dL
Leukocytes,Ua: NEGATIVE
Nitrite: NEGATIVE
Protein, ur: NEGATIVE mg/dL
Specific Gravity, Urine: 1.023 (ref 1.005–1.030)
pH: 5 (ref 5.0–8.0)

## 2019-03-28 LAB — LIPASE, BLOOD: Lipase: 42 U/L (ref 11–51)

## 2019-03-28 LAB — I-STAT BETA HCG BLOOD, ED (MC, WL, AP ONLY): I-stat hCG, quantitative: <5 m[IU]/mL (ref <5)

## 2019-03-28 MED ORDER — ALBENDAZOLE 200 MG PO TABS
400.0000 mg | ORAL_TABLET | Freq: Once | ORAL | Status: AC
  Administered 2019-03-28: 400 mg via ORAL
  Filled 2019-03-28: qty 2

## 2019-03-28 MED ORDER — SODIUM CHLORIDE 0.9% FLUSH: 3.0000 mL | Freq: Once | INTRAVENOUS | Status: DC

## 2019-03-28 MED ORDER — ALBENDAZOLE 200 MG PO TABS: 400.0000 mg | ORAL_TABLET | Freq: Once | ORAL | 0 refills | Status: AC

## 2019-03-28 NOTE — Discharge Instructions (Signed)
Follow-up with the primary care doctor if no improvement in 1 week.  Repeat the medication given as directed if no improvement.  Return to the emergency room for fevers, worsening pain or new concerns.

## 2019-03-28 NOTE — ED Notes (Signed)
Message request sent to pharmacy for Albenza med to be sent to [place]

## 2019-03-28 NOTE — ED Notes (Signed)
Pt brought back to PEDs ED; has gauze on right AC from reported lab draw per pt & no IV is present in either arm (pt took off jacket for view) & no IV was started , just lab draw per pt.

## 2019-03-28 NOTE — ED Provider Notes (Signed)
[facility] EMERGENCY DEPARTMENT Provider Note   CSN: [id number] Arrival date & time: 03/28/19  1516    History   Chief Complaint Chief Complaint  Patient presents with  . Abdominal Pain    HPI Rachel [name] is a 33 y.o. female.     Patient with history of anemia, asthma, bipolar presents with weight loss, lower abdominal cramping and seeing a worm in her stool.  It was longer, no significant bleeding.  Patient denies any travel or new foods.  Patient does eat sushi and does have dogs.  Patient says her animals are due for event appointment.  Patient has no current fever.  Change in bowel movements recently.     Past Medical History:  Diagnosis Date  . ADHD (attention deficit hyperactivity disorder)   . Anemia   . Asthma   . Bipolar disorder ([place])   . Depression   . GERD (gastroesophageal reflux disease)     Patient Active Problem List   Diagnosis Date Noted  . ADHD (attention deficit hyperactivity disorder) 05/07/2012  . Bipolar 1 disorder ([place]) 05/07/2012    Past Surgical History:  Procedure Laterality Date  . NASAL SEPTUM SURGERY    . TONGUE FLAP    . WISDOM TOOTH EXTRACTION       OB History   No obstetric history on file.      Home Medications    Prior to Admission medications   Medication Sig Start Date End Date Taking? Authorizing Provider  acetaminophen (TYLENOL) 500 MG tablet Take 1,000 mg by mouth every 6 (six) hours as needed for headache (pain).   Yes [provider]  Ascorbic Acid (VITAMIN C) 1000 MG tablet Take 1,000 mg by mouth 2 (two) times a day.    Yes [provider]  ibuprofen (ADVIL) 200 MG tablet Take 400 mg by mouth every 6 (six) hours as needed (muscle pain).   Yes [provider]  albendazole (ALBENZA) 200 MG tablet Take 2 tablets (400 mg total) by mouth once for 1 dose. 04/04/19 04/04/19  Elnora Morrison, MD    Family History Family History  Problem Relation Age of Onset  .  Depression Mother   . Anxiety disorder Mother   . Hypertension Mother   . Hyperlipidemia Mother   . Asthma Mother   . Depression Father   . Hypertension Father   . Hyperlipidemia Father   . Bipolar disorder Father   . Bipolar disorder Sister   . ADD / ADHD Sister   . Asthma Sister     Social History Social History   Tobacco Use  . Smoking status: Never Smoker  . Smokeless tobacco: Never Used  Substance Use Topics  . Alcohol use: No  . Drug use: No     Allergies   Amphetamine-dextroamphetamine; Penicillins; Doxycycline; Olanzapine; Other; and Prozac [fluoxetine hcl]   Review of Systems Review of Systems  Constitutional: Positive for unexpected weight change. Negative for chills and fever.  Respiratory: Negative for shortness of breath.   Cardiovascular: Negative for chest pain.  Gastrointestinal: Positive for abdominal pain. Negative for vomiting.  Genitourinary: Negative for dysuria and flank pain.  Musculoskeletal: Negative for back pain, neck pain and neck stiffness.  Skin: Negative for rash.  Neurological: Negative for light-headedness and headaches.     Physical Exam Updated Vital Signs BP (!) 126/91 (BP Location: Right Arm)   Pulse 83   Temp 98.3 F (36.8 C) (Oral)   Resp 16   Ht 5' 2.5" (  1.588 m)   Wt 45.4 kg   SpO2 98%   BMI 18.00 kg/m   Physical Exam Vitals signs and nursing note reviewed.  Constitutional:      Appearance: She is well-developed.  HENT:     Head: Normocephalic and atraumatic.  Eyes:     General:        Right eye: No discharge.        Left eye: No discharge.     Conjunctiva/sclera: Conjunctivae normal.  Neck:     Musculoskeletal: Normal range of motion and neck supple.     Trachea: No tracheal deviation.  Cardiovascular:     Rate and Rhythm: Normal rate.  Pulmonary:     Effort: Pulmonary effort is normal.  Abdominal:     General: There is no distension.     Palpations: Abdomen is soft.     Tenderness: There is  abdominal tenderness (mild lower central abdo). There is no guarding.  Skin:    General: Skin is warm.     Findings: No rash.  Neurological:     Mental Status: She is alert and oriented to person, place, and time.      ED Treatments / Results  Labs (all labs ordered are listed, but only abnormal results are displayed) Labs Reviewed  COMPREHENSIVE METABOLIC PANEL - Abnormal; Notable for the following components:      Result Value   AST 14 (*)    All other components within normal limits  URINALYSIS, ROUTINE W REFLEX MICROSCOPIC - Abnormal; Notable for the following components:   APPearance HAZY (*)    All other components within normal limits  LIPASE, BLOOD  CBC  I-STAT BETA HCG BLOOD, ED ([facility], [facility], [facility] ONLY)    EKG None  Radiology No results found.  Procedures Procedures (including critical care time)  Medications Ordered in ED Medications  sodium chloride flush (NS) 0.9 % injection 3 mL (has no administration in time range)  albendazole (ALBENZA) tablet 400 mg (has no administration in time range)     Initial Impression / Assessment and Plan / ED Course  I have reviewed the triage vital signs and the nursing notes.  Pertinent labs & imaging results that were available during my care of the patient were reviewed by me and considered in my medical decision making (see chart for details).       Patient presents with lower abdominal cramping, weight loss and a worm in her stool.  Blood work ordered and reviewed, no significant abnormalities, normal white blood cell count, normal hemoglobin.  Urinalysis no sign of infection, patient is not pregnant.  Discussed oral treatment with albendazole and follow-up with primary doctor.  Results and differential diagnosis were discussed with the patient/parent/guardian. Xrays were independently reviewed by myself.  Close follow up outpatient was discussed, comfortable with the plan.   Medications  sodium chloride flush (NS) 0.9  % injection 3 mL (has no administration in time range)  albendazole (ALBENZA) tablet 400 mg (has no administration in time range)    Vitals:   03/28/19 1532 03/28/19 1535  BP:  (!) 126/91  Pulse:  83  Resp:  16  Temp:  98.3 F (36.8 C)  TempSrc:  Oral  SpO2:  98%  Weight: 45.4 kg   Height: 5' 2.5" (1.588 m)     Final diagnoses:  Intestinal worms  Weight loss    Final Clinical Impressions(s) / ED Diagnoses   Final diagnoses:  Intestinal worms  Weight loss  ED Discharge Orders         Ordered    albendazole (ALBENZA) 200 MG tablet   Once     03/28/19 1802           Elnora Morrison, MD 03/28/19 1805

## 2019-03-28 NOTE — ED Notes (Signed)
Pt. alert & interactive during discharge; pt. ambulatory to exit

## 2019-03-28 NOTE — ED Triage Notes (Signed)
Patient reports seeing a 6in. Dark tan worm in her stool 2 days ago. She states she has had abdominal cramping, nausea, bloating, diarrhea progressively worsening over the last 2-3 months. Also states she has lost a lot of weight without trying and is down to 100lb this morning. Denies fevers/chills, vomiting.

## 2019-09-29 ENCOUNTER — Other Ambulatory Visit: Payer: Self-pay

## 2019-09-29 ENCOUNTER — Encounter (HOSPITAL_COMMUNITY): Payer: Self-pay

## 2019-09-29 ENCOUNTER — Ambulatory Visit (INDEPENDENT_AMBULATORY_CARE_PROVIDER_SITE_OTHER): Payer: Medicaid Other

## 2019-09-29 ENCOUNTER — Ambulatory Visit (HOSPITAL_COMMUNITY)
Admission: EM | Admit: 2019-09-29 | Discharge: 2019-09-29 | Disposition: A | Discharge status: Home / Self Care | Payer: Medicaid Other | Attending: Internal Medicine | Admitting: Internal Medicine

## 2019-09-29 ENCOUNTER — Emergency Department
Admission: EM | Admit: 2019-09-29 | Discharge: 2019-09-29 | Discharge status: Absent without leave | Payer: Medicaid Other | Source: Home / Self Care

## 2019-09-29 DIAGNOSIS — Z881 Allergy status to other antibiotic agents status: Secondary | ICD-10-CM | POA: Diagnosis not present

## 2019-09-29 DIAGNOSIS — R509 Fever, unspecified: Secondary | ICD-10-CM | POA: Diagnosis not present

## 2019-09-29 DIAGNOSIS — J069 Acute upper respiratory infection, unspecified: Secondary | ICD-10-CM

## 2019-09-29 DIAGNOSIS — Z880 Allergy status to penicillin: Secondary | ICD-10-CM | POA: Diagnosis not present

## 2019-09-29 DIAGNOSIS — Z20828 Contact with and (suspected) exposure to other viral communicable diseases: Secondary | ICD-10-CM | POA: Insufficient documentation

## 2019-09-29 DIAGNOSIS — Z888 Allergy status to other drugs, medicaments and biological substances status: Secondary | ICD-10-CM | POA: Diagnosis not present

## 2019-09-29 DIAGNOSIS — R05 Cough: Secondary | ICD-10-CM

## 2019-09-29 DIAGNOSIS — B9789 Other viral agents as the cause of diseases classified elsewhere: Secondary | ICD-10-CM

## 2019-09-29 DIAGNOSIS — Z79899 Other long term (current) drug therapy: Secondary | ICD-10-CM | POA: Insufficient documentation

## 2019-09-29 MED ORDER — CETIRIZINE HCL 10 MG PO CAPS: 10.0000 mg | ORAL_CAPSULE | Freq: Every day | ORAL | 0 refills | Status: DC

## 2019-09-29 MED ORDER — BENZONATATE 200 MG PO CAPS: 200.0000 mg | ORAL_CAPSULE | Freq: Three times a day (TID) | ORAL | 0 refills | Status: AC | PRN

## 2019-09-29 MED ORDER — ALBUTEROL SULFATE HFA 108 (90 BASE) MCG/ACT IN AERS: 1.0000 | INHALATION_SPRAY | Freq: Four times a day (QID) | RESPIRATORY_TRACT | 0 refills | Status: DC | PRN

## 2019-09-29 MED ORDER — ONDANSETRON 4 MG PO TBDP: 4.0000 mg | ORAL_TABLET | Freq: Three times a day (TID) | ORAL | 0 refills | Status: DC | PRN

## 2019-09-29 MED ORDER — DM-GUAIFENESIN ER 30-600 MG PO TB12: 1.0000 | ORAL_TABLET | Freq: Two times a day (BID) | ORAL | 0 refills | Status: DC

## 2019-09-29 NOTE — Discharge Instructions (Addendum)
Covid swab pending, monitor my chart for results Chest x-ray without any signs of pneumonia Use Tessalon every 8 hours as needed for cough Begin daily cetirizine to help with congestion and drainage Mucinex DM twice daily to further help with congestion and cough Continue Tylenol and ibuprofen Albuterol as needed for shortness of breath Zofran as needed for nausea Rest and push fluids May try Gas-X to help with air and intestinal track  Please follow-up in emergency room if developing worsening pain, vomiting or not passing bowels

## 2019-09-29 NOTE — ED Triage Notes (Signed)
Patient presents to Urgent Care with complaints of generalized body aches, fever, chills, cough, shortness of breath since 4 days ago. Patient reports she has been taking advil for her fevers but it has stayed at 100.5.

## 2019-09-29 NOTE — ED Provider Notes (Signed)
[place]    CSN: [id number] Arrival date & time: 09/29/19  1629      History   Chief Complaint Chief Complaint  Patient presents with  . Generalized Body Aches  . Nausea    HPI Rachel [name] is a 33 y.o. female history of GERD, depression, asthma, presenting today for evaluation of fever body aches and cough.  Patient states that over the past 4 to 5 days she has had a cough.  Of recently she has developed increased shortness of breath and discomfort to her lower left chest.  She states that she has pain in this area at rest as well as with breathing and coughing.  Pain is constant.  She also notes that she continues to have fevers up to 100.5.  Also endorsing chills and body aches.  Has had congestion and sore throat.  Denies any known exposure to Covid.  Patient states that she is immunocompromised and has barely left her home since the Covid pandemic has started.  She also has had some nausea and abdominal pain.  States that her bowels transition between constipation and diarrhea.  She is currently been having looser stools.  Last bowel movement was last night.  She relates her bowels have not been normal since she was noted to have a parasite infection.  She is concerned on if she may still have an infection as she notes that she has a hard time gaining weight.  HPI  Past Medical History:  Diagnosis Date  . ADHD (attention deficit hyperactivity disorder)   . Anemia   . Asthma   . Bipolar disorder ([place])   . Depression   . GERD (gastroesophageal reflux disease)     Patient Active Problem List   Diagnosis Date Noted  . ADHD (attention deficit hyperactivity disorder) 05/07/2012  . Bipolar 1 disorder ([place]) 05/07/2012    Past Surgical History:  Procedure Laterality Date  . NASAL SEPTUM SURGERY    . TONGUE FLAP    . WISDOM TOOTH EXTRACTION      OB History   No obstetric history on file.      Home Medications    Prior to Admission medications    Medication Sig Start Date End Date Taking? Authorizing Provider  acetaminophen (TYLENOL) 500 MG tablet Take 1,000 mg by mouth every 6 (six) hours as needed for headache (pain).    [provider]  albuterol (VENTOLIN HFA) 108 (90 Base) MCG/ACT inhaler Inhale 1-2 puffs into the lungs every 6 (six) hours as needed for wheezing or shortness of breath. 09/29/19   ,  C, PA-C  Ascorbic Acid (VITAMIN C) 1000 MG tablet Take 1,000 mg by mouth 2 (two) times a day.     [provider]  benzonatate (TESSALON) 200 MG capsule Take 1 capsule (200 mg total) by mouth 3 (three) times daily as needed for up to 7 days for cough. 09/29/19 10/06/19  ,  C, PA-C  Cetirizine HCl 10 MG CAPS Take 1 capsule (10 mg total) by mouth daily for 10 days. 09/29/19 10/09/19  ,  C, PA-C  dextromethorphan-guaiFENesin (MUCINEX DM) 30-600 MG 12hr tablet Take 1 tablet by mouth 2 (two) times daily. 09/29/19   ,  C, PA-C  ibuprofen (ADVIL) 200 MG tablet Take 400 mg by mouth every 6 (six) hours as needed (muscle pain).    [provider]  ondansetron (ZOFRAN ODT) 4 MG disintegrating tablet Take 1 tablet (4 mg total) by mouth every 8 (eight) hours  as needed for nausea or vomiting. 09/29/19   , Elesa Hacker, PA-C    Family History Family History  Problem Relation Age of Onset  . Depression Mother   . Anxiety disorder Mother   . Hypertension Mother   . Hyperlipidemia Mother   . Asthma Mother   . Depression Father   . Hypertension Father   . Hyperlipidemia Father   . Bipolar disorder Father   . Bipolar disorder Sister   . ADD / ADHD Sister   . Asthma Sister     Social History Social History   Tobacco Use  . Smoking status: Never Smoker  . Smokeless tobacco: Never Used  Substance Use Topics  . Alcohol use: No  . Drug use: No     Allergies   Amphetamine-dextroamphetamine, Penicillins, Doxycycline, Olanzapine, Other, and Prozac [fluoxetine hcl]    Review of Systems Review of Systems  Constitutional: Positive for appetite change, chills and fever. Negative for activity change and fatigue.  HENT: Positive for congestion, rhinorrhea and sore throat. Negative for ear pain, sinus pressure and trouble swallowing.   Eyes: Negative for discharge and redness.  Respiratory: Positive for cough and shortness of breath. Negative for chest tightness.   Cardiovascular: Negative for chest pain.  Gastrointestinal: Positive for abdominal pain and nausea. Negative for diarrhea and vomiting.  Musculoskeletal: Negative for myalgias.  Skin: Negative for rash.  Neurological: Negative for dizziness, light-headedness and headaches.     Physical Exam Triage Vital Signs ED Triage Vitals  Enc Vitals Group     BP 09/29/19 1739 120/82     Pulse Rate 09/29/19 1739 77     Resp 09/29/19 1739 19     Temp 09/29/19 1739 98.8 F (37.1 C)     Temp Source 09/29/19 1739 Oral     SpO2 09/29/19 1739 100 %     Weight --      Height --      Head Circumference --      Peak Flow --      Pain Score 09/29/19 1737 6     Pain Loc --      Pain Edu? --      Excl. in [place]? --    No data found.  Updated Vital Signs BP 120/82 (BP Location: Right Arm)   Pulse 77   Temp 98.8 F (37.1 C) (Oral)   Resp 19   SpO2 100%   Visual Acuity Right Eye Distance:   Left Eye Distance:   Bilateral Distance:    Right Eye Near:   Left Eye Near:    Bilateral Near:     Physical Exam Vitals signs and nursing note reviewed.  Constitutional:      General: She is not in acute distress.    Appearance: She is well-developed.  HENT:     Head: Normocephalic and atraumatic.     Ears:     Comments: Bilateral ears without tenderness to palpation of external auricle, tragus and mastoid, EAC's without erythema or swelling, TM's with good bony landmarks and cone of light. Non erythematous.     Mouth/Throat:     Comments: Oral mucosa pink and moist, no tonsillar enlargement or  exudate. Posterior pharynx patent and nonerythematous, no uvula deviation or swelling. Normal phonation.  Eyes:     Conjunctiva/sclera: Conjunctivae normal.  Neck:     Musculoskeletal: Neck supple.  Cardiovascular:     Rate and Rhythm: Normal rate and regular rhythm.     Heart sounds: No murmur.  Pulmonary:     Effort: Pulmonary effort is normal. No respiratory distress.     Breath sounds: Normal breath sounds.     Comments: Breathing comfortably at rest, CTABL, no wheezing, rales or other adventitious sounds auscultated Abdominal:     Palpations: Abdomen is soft.     Tenderness: There is no abdominal tenderness.  Skin:    General: Skin is warm and dry.  Neurological:     Mental Status: She is alert.      UC Treatments / Results  Labs (all labs ordered are listed, but only abnormal results are displayed) Labs Reviewed  NOVEL CORONAVIRUS, NAA (HOSP ORDER, SEND-OUT TO REF LAB; TAT 18-24 HRS)    EKG   Radiology Dg Chest 2 View  Result Date: 09/29/2019 CLINICAL DATA:  Generalized body ache, fever, chills and cough. EXAM: CHEST - 2 VIEW COMPARISON:  01/09/2014 FINDINGS: Heart size is normal. Mediastinal shadows are normal. The lungs are clear. No bronchial thickening. No infiltrate, mass, effusion or collapse. Pulmonary vascularity is normal. No bony abnormality. IMPRESSION: Normal chest Electronically Signed   By: Nelson Chimes M.D.   On: 09/29/2019 18:21    Procedures Procedures (including critical care time)  Medications Ordered in UC Medications - No data to display  Initial Impression / Assessment and Plan / UC Course  I have reviewed the triage vital signs and the nursing notes.  Pertinent labs & imaging results that were available during my care of the patient were reviewed by me and considered in my medical decision making (see chart for details).     Chest x-ray negative for acute pulmonary abnormality, does appear to have significant amount of gas to left  upper quadrant of abdomen which may be contributing to discomfort to left lower chest.  No sign of pneumonia at this time.  Covid swab pending.  Discussed symptoms most likely viral, possible flu versus Covid.  Recommending continued symptomatic and supportive care at this time.  Tylenol and ibuprofen for fever, provided albuterol to use as needed for shortness of breath/wheezing for asthma.  Mucinex DM for congestion, cetirizine for congestion and drainage, Zofran for nausea.  Do not suspect obstruction at this time, having regular bowel movements, continue to monitor,Discussed strict return precautions. Patient verbalized understanding and is agreeable with plan.   Final Clinical Impressions(s) / UC Diagnoses   Final diagnoses:  Viral URI with cough     Discharge Instructions     Covid swab pending, monitor my chart for results Chest x-ray without any signs of pneumonia Use Tessalon every 8 hours as needed for cough Begin daily cetirizine to help with congestion and drainage Mucinex DM twice daily to further help with congestion and cough Continue Tylenol and ibuprofen Albuterol as needed for shortness of breath Zofran as needed for nausea Rest and push fluids May try Gas-X to help with air and intestinal track  Please follow-up in emergency room if developing worsening pain, vomiting or not passing bowels   ED Prescriptions    Medication Sig Dispense Auth. Provider   albuterol (VENTOLIN HFA) 108 (90 Base) MCG/ACT inhaler Inhale 1-2 puffs into the lungs every 6 (six) hours as needed for wheezing or shortness of breath. 8 g ,  C, PA-C   benzonatate (TESSALON) 200 MG capsule Take 1 capsule (200 mg total) by mouth 3 (three) times daily as needed for up to 7 days for cough. 28 capsule ,  C, PA-C   Cetirizine HCl 10 MG CAPS Take 1 capsule (  10 mg total) by mouth daily for 10 days. 10 capsule ,  C, PA-C   dextromethorphan-guaiFENesin (MUCINEX DM)  30-600 MG 12hr tablet Take 1 tablet by mouth 2 (two) times daily. 20 tablet ,  C, PA-C   ondansetron (ZOFRAN ODT) 4 MG disintegrating tablet Take 1 tablet (4 mg total) by mouth every 8 (eight) hours as needed for nausea or vomiting. 20 tablet , Williamson C, PA-C     PDMP not reviewed this encounter.   Janith Lima, [place] 09/29/19 2133

## 2019-10-01 LAB — NOVEL CORONAVIRUS, NAA (HOSP ORDER, SEND-OUT TO REF LAB; TAT 18-24 HRS): SARS-CoV-2, NAA: NOT DETECTED

## 2020-01-16 ENCOUNTER — Encounter (HOSPITAL_COMMUNITY): Payer: Self-pay | Admitting: *Deleted

## 2020-01-16 ENCOUNTER — Other Ambulatory Visit: Payer: Self-pay

## 2020-01-16 ENCOUNTER — Emergency Department (HOSPITAL_COMMUNITY)
Admission: EM | Admit: 2020-01-16 | Discharge: 2020-01-16 | Disposition: A | Discharge status: Home / Self Care | Payer: Medicaid Other | Attending: Emergency Medicine | Admitting: Emergency Medicine

## 2020-01-16 ENCOUNTER — Emergency Department (HOSPITAL_COMMUNITY): Payer: Medicaid Other

## 2020-01-16 DIAGNOSIS — N938 Other specified abnormal uterine and vaginal bleeding: Secondary | ICD-10-CM | POA: Insufficient documentation

## 2020-01-16 DIAGNOSIS — F909 Attention-deficit hyperactivity disorder, unspecified type: Secondary | ICD-10-CM | POA: Diagnosis not present

## 2020-01-16 DIAGNOSIS — F319 Bipolar disorder, unspecified: Secondary | ICD-10-CM | POA: Insufficient documentation

## 2020-01-16 DIAGNOSIS — Z79899 Other long term (current) drug therapy: Secondary | ICD-10-CM | POA: Diagnosis not present

## 2020-01-16 DIAGNOSIS — R102 Pelvic and perineal pain: Secondary | ICD-10-CM

## 2020-01-16 LAB — CBC WITH DIFFERENTIAL/PLATELET
Abs Immature Granulocytes: 0.01 10*3/uL (ref 0.00–0.07)
Basophils Absolute: 0 10*3/uL (ref 0.0–0.1)
Basophils Relative: 1 %
Eosinophils Absolute: 0.1 10*3/uL (ref 0.0–0.5)
Eosinophils Relative: 1 %
HCT: 37.7 % (ref 36.0–46.0)
Hemoglobin: 12.8 g/dL (ref 12.0–15.0)
Immature Granulocytes: 0 %
Lymphocytes Relative: 36 %
Lymphs Abs: 2.7 10*3/uL (ref 0.7–4.0)
MCH: 31.3 pg (ref 26.0–34.0)
MCHC: 34 g/dL (ref 30.0–36.0)
MCV: 92.2 fL (ref 80.0–100.0)
Monocytes Absolute: 0.5 10*3/uL (ref 0.1–1.0)
Monocytes Relative: 7 %
Neutro Abs: 4.1 10*3/uL (ref 1.7–7.7)
Neutrophils Relative %: 55 %
Platelets: 228 10*3/uL (ref 150–400)
RBC: 4.09 MIL/uL (ref 3.87–5.11)
RDW: 12.4 % (ref 11.5–15.5)
WBC: 7.5 10*3/uL (ref 4.0–10.5)
nRBC: 0 % (ref 0.0–0.2)

## 2020-01-16 LAB — COMPREHENSIVE METABOLIC PANEL
ALT: 12 U/L (ref 0–44)
AST: 16 U/L (ref 15–41)
Albumin: 4.3 g/dL (ref 3.5–5.0)
Alkaline Phosphatase: 36 U/L — ABNORMAL LOW (ref 38–126)
Anion gap: 8 (ref 5–15)
BUN: 9 mg/dL (ref 6–20)
CO2: 25 mmol/L (ref 22–32)
Calcium: 9.7 mg/dL (ref 8.9–10.3)
Chloride: 107 mmol/L (ref 98–111)
Creatinine, Ser: 0.66 mg/dL (ref 0.44–1.00)
GFR calc Af Amer: >60 mL/min (ref >60)
GFR calc non Af Amer: >60 mL/min (ref >60)
Glucose, Bld: 91 mg/dL (ref 70–99)
Potassium: 3.7 mmol/L (ref 3.5–5.1)
Sodium: 140 mmol/L (ref 135–145)
Total Bilirubin: 0.7 mg/dL (ref 0.3–1.2)
Total Protein: 6.6 g/dL (ref 6.5–8.1)

## 2020-01-16 LAB — URINALYSIS, ROUTINE W REFLEX MICROSCOPIC
Bilirubin Urine: NEGATIVE
Glucose, UA: NEGATIVE mg/dL
Hgb urine dipstick: NEGATIVE
Ketones, ur: NEGATIVE mg/dL
Leukocytes,Ua: NEGATIVE
Nitrite: NEGATIVE
Protein, ur: NEGATIVE mg/dL
Specific Gravity, Urine: 1.021 (ref 1.005–1.030)
pH: 5 (ref 5.0–8.0)

## 2020-01-16 LAB — PREGNANCY, URINE: Preg Test, Ur: NEGATIVE

## 2020-01-16 MED ORDER — DROSPIRENONE-ETHINYL ESTRADIOL 3-0.02 MG PO TABS: 1.0000 | ORAL_TABLET | Freq: Every day | ORAL | 0 refills | Status: DC

## 2020-01-16 MED ORDER — KETOROLAC TROMETHAMINE 60 MG/2ML IM SOLN
30.0000 mg | Freq: Once | INTRAMUSCULAR | Status: AC
  Administered 2020-01-16: 30 mg via INTRAMUSCULAR
  Filled 2020-01-16: qty 2

## 2020-01-16 NOTE — Social Work (Signed)
EDCSW met with Pt at bedside. Pt states that she does not have a PCP for care. TOC team was able to arrange appointment with [facility] for 02/04/20. Appointment is in AVS, CSW also provided Pt with brochure with written time/date for appointment.  CSW also provided Pt with information for [facility] as well.  CSW spent time with Pt discussing various health challenges and the impact on mental well-being. CSW encouraged Pt to utilize counseling services at [facility]. Pt expressed appreciation.

## 2020-01-16 NOTE — ED Triage Notes (Signed)
C/o vaginal bleeding onset last of Jan. States she has abd. Pain also

## 2020-01-16 NOTE — Discharge Instructions (Signed)
Take Ibuprofen or Tylenol as needed for pain Try a heating pad on the pelvic area to help with pain Start birth control pills - take once daily for 30 days Please follow up with a primary doctor - an appointment was made for you on 3/24 at [place] at 2:30pm Return if you are worsening

## 2020-01-16 NOTE — ED Provider Notes (Signed)
[facility] EMERGENCY DEPARTMENT Provider Note   CSN: [id number] Arrival date & time: 01/16/20  1132  History Chief Complaint  Patient presents with  . Vaginal Bleeding    Rachel [name] is a 34 y.o. G0P0 female with history of bipolar d/o, depression, GERD who presents with vaginal bleeding. She reports that she had irregular periods since late 2019. She reports that she will have a period every other week and it will last a week and a half. She has had continuous bleeding since Jan 29. She reports using 2 super tampons every hour and maxi pads overnight. She passes clots at times. She came to the ED today because of worsening pelvic pain. It is over the center and left side. Several family members have endometriosis and fibroids and she is wondering is she could have this. She has tried NSAIDs, Tylenol, and midol without relief. She came to the ED because she is uninsured and didn't know where else to go. She has not seen OBGYN for this issue. She is not sexually active. She got a divorce and states her husband was unfaithful - she was tested for STDs at that time and was told this was all negative. She denies vaginal discharge. She does not take birth control but is interested in the [place].  HPI     Past Medical History:  Diagnosis Date  . ADHD (attention deficit hyperactivity disorder)   . Anemia   . Asthma   . Bipolar disorder ([place])   . Depression   . GERD (gastroesophageal reflux disease)     Patient Active Problem List   Diagnosis Date Noted  . ADHD (attention deficit hyperactivity disorder) 05/07/2012  . Bipolar 1 disorder ([place]) 05/07/2012    Past Surgical History:  Procedure Laterality Date  . NASAL SEPTUM SURGERY    . TONGUE FLAP    . WISDOM TOOTH EXTRACTION       OB History   No obstetric history on file.     Family History  Problem Relation Age of Onset  . Depression Mother   . Anxiety disorder Mother   . Hypertension Mother   .  Hyperlipidemia Mother   . Asthma Mother   . Depression Father   . Hypertension Father   . Hyperlipidemia Father   . Bipolar disorder Father   . Bipolar disorder Sister   . ADD / ADHD Sister   . Asthma Sister     Social History   Tobacco Use  . Smoking status: Never Smoker  . Smokeless tobacco: Never Used  Substance Use Topics  . Alcohol use: No  . Drug use: No    Home Medications Prior to Admission medications   Medication Sig Start Date End Date Taking? Authorizing Provider  acetaminophen (TYLENOL) 500 MG tablet Take 1,000 mg by mouth every 6 (six) hours as needed for headache (pain).   Yes [provider]  albuterol (VENTOLIN HFA) 108 (90 Base) MCG/ACT inhaler Inhale 1-2 puffs into the lungs every 6 (six) hours as needed for wheezing or shortness of breath. 09/29/19  Yes Wieters, Hallie C, PA-C  ibuprofen (ADVIL) 200 MG tablet Take 800 mg by mouth every 6 (six) hours as needed (muscle pain).    Yes [provider]  Multiple Vitamin (MULTIVITAMIN WITH MINERALS) TABS tablet Take 1 tablet by mouth at bedtime.   Yes [provider]    Allergies    Amphetamine-dextroamphetamine, Penicillins, Doxycycline, Olanzapine, Other, and Prozac [fluoxetine hcl]  Review of Systems  Review of Systems  Constitutional: Negative for fever.  Respiratory: Negative for shortness of breath.   Cardiovascular: Negative for chest pain.  Gastrointestinal: Positive for diarrhea (chronic). Negative for abdominal pain, nausea and vomiting.  Genitourinary: Positive for pelvic pain and vaginal bleeding. Negative for dysuria and vaginal discharge.  All other systems reviewed and are negative.   Physical Exam Updated Vital Signs BP 116/68 (BP Location: Right Arm)   Pulse 74   Temp 98.2 F (36.8 C) (Oral)   Resp 16   Ht 5\' 3"  (1.6 m)   Wt 45.4 kg   SpO2 96%   BMI 17.71 kg/m   Physical Exam Vitals and nursing note reviewed.  Constitutional:      General: She is not  in acute distress.    Appearance: Normal appearance. She is well-developed. She is not ill-appearing.     Comments: Thin, calm, cooperative  HENT:     Head: Normocephalic and atraumatic.  Eyes:     General: No scleral icterus.       Right eye: No discharge.        Left eye: No discharge.     Conjunctiva/sclera: Conjunctivae normal.     Pupils: Pupils are equal, round, and reactive to light.  Cardiovascular:     Rate and Rhythm: Normal rate and regular rhythm.  Pulmonary:     Effort: Pulmonary effort is normal. No respiratory distress.     Breath sounds: Normal breath sounds.  Abdominal:     General: There is no distension.     Palpations: Abdomen is soft.     Tenderness: There is no abdominal tenderness.  Genitourinary:    Comments: Pelvic: No inguinal lymphadenopathy or inguinal hernia noted. Normal external genitalia. Moderate pain with speculum insertion. Cervix is low and to the left. Closed cervical os with normal appearance - no rash or lesions. There is scant old appearing blood in the vaginal vault. On bimanual examination no adnexal tenderness or cervical motion tenderness. Chaperone present during exam.   Musculoskeletal:     Cervical back: Normal range of motion.  Skin:    General: Skin is warm and dry.  Neurological:     Mental Status: She is alert and oriented to person, place, and time.  Psychiatric:        Behavior: Behavior normal.     ED Results / Procedures / Treatments   Labs (all labs ordered are listed, but only abnormal results are displayed) Labs Reviewed  COMPREHENSIVE METABOLIC PANEL - Abnormal; Notable for the following components:      Result Value   Alkaline Phosphatase 36 (*)    All other components within normal limits  URINALYSIS, ROUTINE W REFLEX MICROSCOPIC - Abnormal; Notable for the following components:   APPearance HAZY (*)    All other components within normal limits  CBC WITH DIFFERENTIAL/PLATELET  PREGNANCY, URINE     EKG None  Radiology US PELVIC COMPLETE W TRANSVAGINAL AND TORSION R/O  Result Date: 01/16/2020 CLINICAL DATA:  Pelvic pain for 1 week.  LMP 12/12/2019. EXAM: TRANSABDOMINAL AND TRANSVAGINAL ULTRASOUND OF PELVIS DOPPLER ULTRASOUND OF OVARIES TECHNIQUE: Both transabdominal and transvaginal ultrasound examinations of the pelvis were performed. Transabdominal technique was performed for global imaging of the pelvis including uterus, ovaries, adnexal regions, and pelvic cul-de-sac. It was necessary to proceed with endovaginal exam following the transabdominal exam to visualize the endometrium and ovaries. Color and duplex Doppler ultrasound was utilized to evaluate blood flow to the ovaries. COMPARISON:  None. FINDINGS: Uterus  Measurements: 6.9 x 4.1 x 5.4 cm = volume: 81 mL. No fibroids or other mass visualized. Endometrium Thickness: 12 mm.  No focal abnormality visualized. Right ovary Measurements: 3.6 x 2.6 x 2.7 cm = volume: 13.1 mL. 2 cm corpus luteum noted. No ovarian or adnexal mass. Left ovary Measurements: 2.7 x 1.6 x 2.4 cm = volume: 5.6 mL. Normal appearance/no adnexal mass. Pulsed Doppler evaluation of both ovaries demonstrates normal low-resistance arterial and venous waveforms. Other findings No abnormal free fluid. IMPRESSION: No pelvic mass or other significant abnormality identified. No sonographic evidence for ovarian torsion. Electronically Signed   By: Marlaine Hind M.D.   On: 01/16/2020 18:02    Procedures Procedures (including critical care time)  Medications Ordered in ED Medications  ketorolac (TORADOL) injection 30 mg (30 mg Intramuscular Given 01/16/20 1817)    ED Course  I have reviewed the triage vital signs and the nursing notes.  Pertinent labs & imaging results that were available during my care of the patient were reviewed by me and considered in my medical decision making (see chart for details).  34 year old female presents with acute on chronic pelvic pain and  irregular periods. Vitals are reassuring. Heart is regular rate and rhythm and lungs are clear. Abdomen is soft and non-tender. Pelvic was performed and is grossly normal. There is scant amount of old appearing blood. Cervix is closed and there are no significant lesions or discharge. She declined STD testing today as she has not been sexually active. She is diffusely tender with bimanual exam. Labs are reassuring. UA is normal. Preg test is negative. Pelvic US was obtained which is normal. DDx: dysmenorrhea, hormonal imbalance, endometriosis. CM was consulted and set her up with a PCP later this month. She is not improving with NSAIDs so was offered OCPs which she is willing to try. Advised f/u with PCP.  MDM Rules/Calculators/A&P                       Final Clinical Impression(s) / ED Diagnoses Final diagnoses:  Dysfunctional uterine bleeding    Rx / DC Orders ED Discharge Orders    None       Recardo Evangelist, PA-C 01/17/20 1212    Carmin Muskrat, MD 01/19/20 2028

## 2020-02-04 ENCOUNTER — Ambulatory Visit: Payer: Medicaid Other | Attending: Family Medicine | Admitting: Family Medicine

## 2020-02-04 ENCOUNTER — Other Ambulatory Visit: Payer: Self-pay

## 2020-02-04 ENCOUNTER — Encounter: Payer: Self-pay | Admitting: Family Medicine

## 2020-02-04 VITALS — BP 117/82 | HR 87 | Temp 98.8°F | Ht 63.0 in | Wt 104.4 lb

## 2020-02-04 DIAGNOSIS — R109 Unspecified abdominal pain: Secondary | ICD-10-CM | POA: Diagnosis not present

## 2020-02-04 DIAGNOSIS — N938 Other specified abnormal uterine and vaginal bleeding: Secondary | ICD-10-CM

## 2020-02-04 DIAGNOSIS — Z09 Encounter for follow-up examination after completed treatment for conditions other than malignant neoplasm: Secondary | ICD-10-CM | POA: Diagnosis not present

## 2020-02-04 DIAGNOSIS — R638 Other symptoms and signs concerning food and fluid intake: Secondary | ICD-10-CM

## 2020-02-04 DIAGNOSIS — R1011 Right upper quadrant pain: Secondary | ICD-10-CM

## 2020-02-04 DIAGNOSIS — Z8619 Personal history of other infectious and parasitic diseases: Secondary | ICD-10-CM | POA: Diagnosis not present

## 2020-02-04 MED ORDER — CYCLOBENZAPRINE HCL 5 MG PO TABS: 5.0000 mg | ORAL_TABLET | Freq: Three times a day (TID) | ORAL | 0 refills | Status: DC | PRN

## 2020-02-04 NOTE — Patient Instructions (Signed)
Dysfunctional Uterine Bleeding Dysfunctional uterine bleeding is abnormal bleeding from the uterus. Dysfunctional uterine bleeding includes:  A menstrual period that comes earlier or later than usual.  A menstrual period that is lighter or heavier than usual, or has large blood clots.  Vaginal bleeding between menstrual periods.  Skipping one or more menstrual periods.  Vaginal bleeding after sex.  Vaginal bleeding after menopause. Follow these instructions at home: Eating and drinking   Eat well-balanced meals. Include foods that are high in iron, such as liver, meat, shellfish, green leafy vegetables, and eggs.  To prevent or treat constipation, your health care provider may recommend that you: ? Drink enough fluid to keep your urine pale yellow. ? Take over-the-counter or prescription medicines. ? Eat foods that are high in fiber, such as beans, whole grains, and fresh fruits and vegetables. ? Limit foods that are high in fat and processed sugars, such as fried or sweet foods. Medicines  Take over-the-counter and prescription medicines only as told by your health care provider.  Do not change medicines without talking with your health care provider.  Aspirin or medicines that contain aspirin may make the bleeding worse. Do not take those medicines: ? During the week before your menstrual period. ? During your menstrual period.  If you were prescribed iron pills, take them as told by your health care provider. Iron pills help to replace iron that your body loses because of this condition. Activity  If you need to change your sanitary pad or tampon more than one time every 2 hours: ? Lie in bed with your feet raised (elevated). ? Place a cold pack on your lower abdomen. ? Rest as much as possible until the bleeding stops or slows down.  Do not try to lose weight until the bleeding has stopped and your blood iron level is back to normal. General instructions   For two  months, write down: ? When your menstrual period starts. ? When your menstrual period ends. ? When any abnormal vaginal bleeding occurs. ? What problems you notice.  Keep all follow up visits as told by your health care provider. This is important. Contact a health care provider if you:  Feel light-headed or weak.  Have nausea and vomiting.  Cannot eat or drink without vomiting.  Feel dizzy or have diarrhea while you are taking medicines.  Are taking birth control pills or hormones, and you want to change them or stop taking them. Get help right away if:  You develop a fever or chills.  You need to change your sanitary pad or tampon more than one time per hour.  Your vaginal bleeding becomes heavier, or your flow contains clots more often.  You develop pain in your abdomen.  You lose consciousness.  You develop a rash. Summary  Dysfunctional uterine bleeding is abnormal bleeding from the uterus.  It includes menstrual bleeding of abnormal duration, volume, or regularity.  Bleeding after sex and after menopause are also considered dysfunctional uterine bleeding. This information is not intended to replace advice given to you by your health care provider. Make sure you discuss any questions you have with your health care provider. Document Revised: 04/10/2018 Document Reviewed: 04/10/2018 Elsevier Patient Education  2020 [employer]  

## 2020-02-04 NOTE — Progress Notes (Signed)
Subjective:  Patient ID: Rachel [name], female    DOB: [birth year]  Age: 34 y.o. MRN: [id number]  CC: No chief complaint on file.   HPI Rachel [name], 34 year old female, who per chart has medical issues including bipolar disorder, depression, ADHD, asthma and GERD who is status post emergency department visit on 01/16/2020, due to complaint of vaginal bleeding and irregular menses.  At that time she reported continuous bleeding since December 12, 2019.  Patient was concerned about possible endometriosis or uterine fibroids.  She was prescribed oral contraceptives which she has now started.  She reports that she has recurrent abdominal and pelvic pain before during and after her menses.  Patient is accompanied by her mother who states that patient often has to lie in bed for most of the day in a fetal position due to the pain she has associated with her menstrual cycles.  She did have some increase in abdominal/pelvic pain even after starting OCPs but over the past week, pain level has decreased.  Pain was previously greater than a 10 on a 0-to-10 scale and now around a 4-6.  She reports that previously her menses were very heavy and it seemed as if she was having onset of her menses every 2 weeks since January 2019.  When she went to the emergency department, she reports that she had been having her menses daily since January 29 along with worsening pelvic pain along with very heavy bleeding along with passage of blood clots.          She also reports that she feels as if she cannot maintain weight.  She does feel that she has an adequate daily food intake but despite this, she feels as if she cannot gain weight.  She has also noticed issues with alternating diarrhea followed by constipation.  She also has had issues with recurrent abdominal cramping.  She does not feel as if her current symptoms are reflux related.  Over-the-counter medications do not seem to provide any relief of the abdominal  pain and cramping in her lower abdomen.  She also at times feels as if she has a recurrent low-grade fever.  She does not feel as if she is having night sweats.  She reports that in the past, she had a parasitic infection.  She is not sure if this is contributing to her abdominal pain and difficulty maintaining her weight.  She reports that she was treated for her parasitic infection in the emergency department after seeing a worm in her stool.  She states that after treatment for the parasite, she still noticed eggs in her stool for a few more months.  She does not have any current issues with shortness of breath or cough, no chest pain or palpitations.  Past Medical History:  Diagnosis Date  . ADHD (attention deficit hyperactivity disorder)   . Anemia   . Asthma   . Bipolar disorder ([place])   . Depression   . GERD (gastroesophageal reflux disease)     Past Surgical History:  Procedure Laterality Date  . NASAL SEPTUM SURGERY    . TONGUE FLAP    . WISDOM TOOTH EXTRACTION      Family History  Problem Relation Age of Onset  . Depression Mother   . Anxiety disorder Mother   . Hypertension Mother   . Hyperlipidemia Mother   . Asthma Mother   . Depression Father   . Hypertension Father   . Hyperlipidemia Father   . Bipolar disorder  Father   . Bipolar disorder Sister   . ADD / ADHD Sister   . Asthma Sister     Social History   Tobacco Use  . Smoking status: Never Smoker  . Smokeless tobacco: Never Used  Substance Use Topics  . Alcohol use: No    ROS Review of Systems  Constitutional: Positive for chills, fatigue and fever.  HENT: Negative for sore throat and trouble swallowing.   Eyes: Negative for photophobia and visual disturbance.  Respiratory: Negative for cough and shortness of breath.   Cardiovascular: Negative for chest pain, palpitations and leg swelling.  Gastrointestinal: Positive for abdominal pain, constipation, diarrhea and nausea. Negative for blood in stool.    Endocrine: Negative for polydipsia, polyphagia and polyuria.  Genitourinary: Positive for menstrual problem and pelvic pain. Negative for dysuria, frequency and vaginal discharge.  Musculoskeletal: Positive for arthralgias. Negative for joint swelling.  Skin: Negative for rash and wound.  Neurological: Positive for headaches (Occasional). Negative for dizziness.  Hematological: Negative for adenopathy. Does not bruise/bleed easily.    Objective:   Today's Vitals: BP 117/82 (BP Location: Left Arm, Patient Position: Sitting, Cuff Size: Large)   Pulse 87   Temp 98.8 F (37.1 C) (Oral)   Ht 5\' 3"  (1.6 m)   Wt 104 lb 6.4 oz (47.4 kg)   BMI 18.49 kg/m   Physical Exam Vitals and nursing note reviewed.  Constitutional:      General: She is not in acute distress.    Appearance: Normal appearance.     Comments: Thin framed female in no acute distress, wearing facemask as per office COVID-19 protocol.  Patient is accompanied by her mother at today's visit  Cardiovascular:     Rate and Rhythm: Normal rate and regular rhythm.  Pulmonary:     Effort: Pulmonary effort is normal.     Breath sounds: Normal breath sounds.  Abdominal:     Palpations: Abdomen is soft.     Tenderness: There is abdominal tenderness. There is no right CVA tenderness, left CVA tenderness, guarding or rebound.     Comments: Patient with some lower abdominal discomfort to palpation as well as right upper quadrant tenderness on exam.  No rebound or guarding.  Musculoskeletal:        General: No tenderness.     Cervical back: Normal range of motion and neck supple. No tenderness.     Right lower leg: No edema.     Left lower leg: No edema.  Lymphadenopathy:     Cervical: No cervical adenopathy.  Skin:    General: Skin is warm and dry.  Neurological:     General: No focal deficit present.     Mental Status: She is alert and oriented to person, place, and time.  Psychiatric:        Mood and Affect: Mood normal.         Behavior: Behavior normal.     Comments: Appears slightly anxious at times     Assessment & [name]:  1. DUB (dysfunctional uterine bleeding); 5.  Encounter for examination following treatment at hospital Emergency department visit notes from 01/16/2020 were reviewed and discussed with patient at today's visit.  She has had irregular menses for more than a year as well as recurrent abdominal/pelvic pain.  Pelvic/transvaginal ultrasound of the uterus and ovaries done on 01/16/2020 showed no abnormalities.  She will be referred to gynecology for further evaluation and treatment.  We will also check thyroid panel in follow-up of her  irregular menses.  She has had some recent decrease in abdominal pain and bleeding has stopped with use of OCP.  Patient's OCP however can also cause potassium issues and she will have repeat electrolytes at today's visit as well.  Educational material on dysfunctional uterine bleeding provided as part of after visit summary. - Ambulatory referral to Gynecology - Thyroid Panel With TSH  2. Abdominal cramping Patient reports recurrent abdominal cramping, difficulty maintaining weight and history of parasitic infection.  Her abdominal cramping could be related to her menstrual issues/dysfunctional uterine bleeding but as patient also has abdominal cramping and recurrent diarrhea which sometimes alternates with constipation, she will also be referred to gastroenterology for further evaluation and treatment as she may also have issues with IBS.  Will check basic metabolic panel in follow-up of her abdominal cramping and recurrent issues with diarrhea alternating with constipation.  Prescription provided for Flexeril 5 mg to take up to 3 times daily as needed for muscle spasm.  She was not anemic on recent blood work therefore she may also wish to try over-the-counter Aleve/naproxen as this may give some relief if camping is menstrual related. - Ambulatory referral to  Gastroenterology - Basic Metabolic Panel - cyclobenzaprine (FLEXERIL) 5 MG tablet; Take 1 tablet (5 mg total) by mouth 3 (three) times daily as needed for muscle spasms.  Dispense: 30 tablet; Refill: 0  3. History of parasitic infection She reports past history of parasitic infection.  Review of her chart, she was seen in the emergency department on 03/28/2019 due to the complaint of seeing a worm in her stool.  She was treated with Albenza.  She has not sure of the cause and did not have any international travel or camping/drinking water that was from a stream, lake or river.  Her only possible source of infection that she can recall is that she did eat sushi on a regular basis in the past.  She will be referred to gastroenterology and infectious disease for further evaluation and treatment as she also complains of recurrent abdominal cramping and difficulty maintaining weight. - Ambulatory referral to Gastroenterology - Ambulatory referral to Infectious Disease  4. Difficulty maintaining weight She reports that she has had difficulty maintaining weight despite a decreased level of activity due to her dysfunctional uterine bleeding/recurrent abdominal pain and she feels that she does have adequate daily caloric intake.  She is being referred to gastroenterology for further evaluation.  We will also check thyroid panel to look for thyroid abnormality which may be contributing to issues with maintenance of weight as well as to check BMP for electrolyte abnormality.  Liver enzymes and electrolytes were normal on recent comprehensive metabolic panel done at her 01/16/2020 ED visit as well as normal complete blood count. - Ambulatory referral to Gastroenterology - Basic Metabolic Panel  - Thyroid Panel With TSH  6. Right upper quadrant pain On today's examination, patient with right upper quadrant pain with palpation.  We will have patient scheduled for abdominal ultrasound to look for possible gallbladder  abnormality or fatty liver.  She did have recent normal liver enzymes on blood work done in the emergency department on 01/16/2020. - US Abdomen Limited RUQ; Future  Outpatient Encounter Medications as of 02/04/2020  Medication Sig  . acetaminophen (TYLENOL) 500 MG tablet Take 1,000 mg by mouth every 6 (six) hours as needed for headache (pain).  [name] Kitchen albuterol (VENTOLIN HFA) 108 (90 Base) MCG/ACT inhaler Inhale 1-2 puffs into the lungs every 6 (six) hours as  needed for wheezing or shortness of breath.  . drospirenone-ethinyl estradiol (YAZ) 3-0.02 MG tablet Take 1 tablet by mouth daily.  [name] Kitchen ibuprofen (ADVIL) 200 MG tablet Take 800 mg by mouth every 6 (six) hours as needed (muscle pain).   . Multiple Vitamin (MULTIVITAMIN WITH MINERALS) TABS tablet Take 1 tablet by mouth at bedtime.   No facility-administered encounter medications on file as of 02/04/2020.    An After Visit Summary was printed and given to the patient.   Follow-up: Return in about 6 weeks (around 03/17/2020) for abdominal pain/weight.   Antony Blackbird MD

## 2020-02-04 NOTE — Progress Notes (Signed)
HFU for vaginal bleeding/ have family history on cystic fibrocysts and endometriosis.     Pain causes her to cry  Low Grade fever up to 100.2  Can't gain weight

## 2020-02-05 ENCOUNTER — Telehealth: Payer: Self-pay | Admitting: *Deleted

## 2020-02-05 LAB — BASIC METABOLIC PANEL WITH GFR
BUN/Creatinine Ratio: 19 (ref 9–23)
BUN: 14 mg/dL (ref 6–20)
CO2: 24 mmol/L (ref 20–29)
Calcium: 9.5 mg/dL (ref 8.7–10.2)
Chloride: 107 mmol/L — ABNORMAL HIGH (ref 96–106)
Creatinine, Ser: 0.72 mg/dL (ref 0.57–1.00)
GFR calc Af Amer: 128 mL/min/1.73
GFR calc non Af Amer: 111 mL/min/1.73
Glucose: 77 mg/dL (ref 65–99)
Potassium: 4.5 mmol/L (ref 3.5–5.2)
Sodium: 142 mmol/L (ref 134–144)

## 2020-02-05 LAB — THYROID PANEL WITH TSH
Free Thyroxine Index: 1.6 (ref 1.2–4.9)
T3 Uptake Ratio: 18 % — ABNORMAL LOW (ref 24–39)
T4, Total: 9 ug/dL (ref 4.5–12.0)
TSH: 1.76 u[IU]/mL (ref 0.450–4.500)

## 2020-02-05 NOTE — Telephone Encounter (Signed)
Prior Auth for patient Ultrasound of [facility].    Authorization Number:  [id number]   CPT code is [id number]  Auth End Date:  08/03/2020

## 2020-02-10 ENCOUNTER — Telehealth: Payer: Self-pay | Admitting: *Deleted

## 2020-02-10 ENCOUNTER — Other Ambulatory Visit: Payer: Self-pay | Admitting: Family Medicine

## 2020-02-10 MED ORDER — DROSPIRENONE-ETHINYL ESTRADIOL 3-0.02 MG PO TABS: 1.0000 | ORAL_TABLET | Freq: Every day | ORAL | 2 refills | Status: DC

## 2020-02-10 NOTE — Progress Notes (Signed)
Patient ID: Rachel [name], female   DOB: 26-Apr-1986, 34 y.o.   MRN: [id number]   Message received that patient needs refill of Yaz as her GYN appointment is several weeks away

## 2020-02-10 NOTE — Telephone Encounter (Signed)
Spoke with patient and she stated she would like refills for her [facility] due to not seeing the GYN until later in April.

## 2020-02-10 NOTE — Telephone Encounter (Signed)
Please notify patient that refill was sent to [facility]

## 2020-02-11 NOTE — Telephone Encounter (Signed)
Informed patient with information and she verbalized understanding.  

## 2020-02-16 ENCOUNTER — Encounter: Payer: Self-pay | Admitting: Nurse Practitioner

## 2020-02-19 ENCOUNTER — Ambulatory Visit: Payer: Medicaid Other | Attending: Internal Medicine

## 2020-02-19 DIAGNOSIS — Z23 Encounter for immunization: Secondary | ICD-10-CM

## 2020-02-19 NOTE — Progress Notes (Signed)
   Covid-19 Vaccination Clinic  Name:  Rachel [name]    MRN: [id number] DOB: [birth year]  02/19/2020  Rachel [name] was observed post Covid-19 immunization for 30 minutes based on pre-vaccination screening without incident. She was provided with Vaccine Information Sheet and instruction to access the V-Safe system.   Rachel [name] was instructed to call 911 with any severe reactions post vaccine: [name] Kitchen Difficulty breathing  . Swelling of face and throat  . A fast heartbeat  . A bad rash all over body  . Dizziness and weakness   Immunizations Administered    Name Date Dose VIS Date Route   Pfizer COVID-19 Vaccine 02/19/2020  2:41 PM 0.3 mL 10/24/2019 Intramuscular   Manufacturer: [place]   Lot: [id number]   [place]: [id number]

## 2020-02-23 ENCOUNTER — Other Ambulatory Visit: Payer: Self-pay

## 2020-02-23 ENCOUNTER — Encounter: Payer: Self-pay | Admitting: Infectious Disease

## 2020-02-23 ENCOUNTER — Ambulatory Visit (INDEPENDENT_AMBULATORY_CARE_PROVIDER_SITE_OTHER): Payer: Medicaid Other | Admitting: Infectious Disease

## 2020-02-23 DIAGNOSIS — N938 Other specified abnormal uterine and vaginal bleeding: Secondary | ICD-10-CM

## 2020-02-23 DIAGNOSIS — R634 Abnormal weight loss: Secondary | ICD-10-CM

## 2020-02-23 DIAGNOSIS — R630 Anorexia: Secondary | ICD-10-CM | POA: Insufficient documentation

## 2020-02-23 DIAGNOSIS — R1011 Right upper quadrant pain: Secondary | ICD-10-CM | POA: Diagnosis not present

## 2020-02-23 DIAGNOSIS — B89 Unspecified parasitic disease: Secondary | ICD-10-CM

## 2020-02-23 DIAGNOSIS — B999 Unspecified infectious disease: Secondary | ICD-10-CM | POA: Insufficient documentation

## 2020-02-23 DIAGNOSIS — K30 Functional dyspepsia: Secondary | ICD-10-CM | POA: Diagnosis not present

## 2020-02-23 HISTORY — DX: Right upper quadrant pain: R10.11

## 2020-02-23 HISTORY — DX: Abnormal weight loss: R63.4

## 2020-02-23 HISTORY — DX: Anorexia: R63.0

## 2020-02-23 HISTORY — DX: Functional dyspepsia: K30

## 2020-02-23 HISTORY — DX: Other specified abnormal uterine and vaginal bleeding: N93.8

## 2020-02-23 HISTORY — DX: Unspecified parasitic disease: B89

## 2020-02-23 NOTE — Progress Notes (Signed)
Reason for infectious disease consult: History of reported parasitic infection abdominal pain  Requesting physician: Antony Blackbird, MD  Subjective:    Patient ID: Rachel [name], female    DOB: November 22, 1985, 34 y.o.   MRN: [id number]  HPI  Rachel [name] is a 34 year old Caucasian female whose had a past medical history significant for difficulties with gaining weight as a teenager delayed gastric emptying some depression ADHD per the chart bipolar disorder.  She has had a number of difficult symptoms over the years including loss of 100 pounds of weight that is seeming to be involuntary.  She does endorse poor appetite and that multiple foods cause her to feel sick but she has been trying to eat as much as possible to put on weight and had great deal of difficulty doing so.  In May 2020 she had seen what appeared to be a large flat worm in her stool followed then by eggs.  She tried to engage with the medical community but was only able to do so remotely.  Ultimately she did come to the emergency department and was seen.  For some reason I do not understand a stool specimen was not obtained.  She says "they want to look at my urine instead of my stool."  She was given albendazole in the ER and apparently no longer saw any worms but did see some objects that look like eggs that her mother also observed.  Noted mother is also a Marine scientist who works here at [employer].  Patient apparently also was having some episodes of fever at the time.  She does not have any history of foreign travel she was not camping she has not been raising animals.  Her mom does remind her and she recounts the time of living with a boyfriend in a very unkept apartment where the boyfriend was hoarding and there was food rotting in newspapers stacked and not thrown away and garbage between chairs and cushions.  She does have 2 dogs at home who are otherwise healthy.  He is continue to have cramping abdominal pain and in particular  right upper quadrant pain along with other symptoms including dysfunctional uterine bleeding.  She was referred to [place] due to her history of having what was thought to be a parasitic infection though we do not have documentation of this through a formal oval ova and parasite or pathological exam, along with her continued cramping abdominal pain.    Past Medical History:  Diagnosis Date  . ADHD (attention deficit hyperactivity disorder)   . Anemia   . Asthma   . Bipolar disorder ([place])   . Delayed gastric emptying 02/23/2020  . Depression   . DUB (dysfunctional uterine bleeding) 02/23/2020  . GERD (gastroesophageal reflux disease)   . Parasitic infection 02/23/2020  . Poor appetite 02/23/2020  . RUQ pain 02/23/2020  . Weight loss 02/23/2020    Past Surgical History:  Procedure Laterality Date  . NASAL SEPTUM SURGERY    . TONGUE FLAP    . WISDOM TOOTH EXTRACTION      Family History  Problem Relation Age of Onset  . Depression Mother   . Anxiety disorder Mother   . Hypertension Mother   . Hyperlipidemia Mother   . Asthma Mother   . Depression Father   . Hypertension Father   . Hyperlipidemia Father   . Bipolar disorder Father   . Bipolar disorder Sister   . ADD / ADHD Sister   . Asthma Sister  Social History   Socioeconomic History  . Marital status: Divorced    Spouse name: Not on file  . Number of children: Not on file  . Years of education: Not on file  . Highest education level: Not on file  Occupational History  . Not on file  Tobacco Use  . Smoking status: Never Smoker  . Smokeless tobacco: Never Used  Substance and Sexual Activity  . Alcohol use: No  . Drug use: No  . Sexual activity: Yes    Birth control/protection: Condom  Other Topics Concern  . Not on file  Social History Narrative  . Not on file   Social Determinants of Health   Financial Resource Strain:   . Difficulty of Paying Living Expenses:   Food Insecurity:   . Worried About  Charity fundraiser in the Last Year:   . Arboriculturist in the Last Year:   Transportation Needs:   . Film/video editor (Medical):   [name] Kitchen Lack of Transportation (Non-Medical):   Physical Activity:   . Days of Exercise per Week:   . Minutes of Exercise per Session:   Stress:   . Feeling of Stress :   Social Connections:   . Frequency of Communication with Friends and Family:   . Frequency of Social Gatherings with Friends and Family:   . Attends Religious Services:   . Active Member of Clubs or Organizations:   . Attends Archivist Meetings:   [name] Kitchen Marital Status:     Allergies  Allergen Reactions  . Amphetamine-Dextroamphetamine Other (See Comments)    Unknown reaction  . Penicillins Swelling    Severe allergy per allergy test Did it involve swelling of the face/tongue/throat, SOB, or low BP? Yes Did it involve sudden or severe rash/hives, skin peeling, or any reaction on the inside of your mouth or nose? Unknown Did you need to seek medical attention at a hospital or doctor's office? Unknown When did it last happen?teenager If all above answers are "NO", may proceed with cephalosporin use.  [name] Kitchen Doxycycline Itching, Nausea And Vomiting and Other (See Comments)    Caused high fever  . Olanzapine Other (See Comments)    Unknown reaction  . Other     Stimulants cause unknown reaction  . Prozac [Fluoxetine Hcl] Other (See Comments)    Unknown reaction     Current Outpatient Medications:  .  drospirenone-ethinyl estradiol (YAZ) 3-0.02 MG tablet, Take 1 tablet by mouth daily., Disp: 1 Package, Rfl: 2 .  acetaminophen (TYLENOL) 500 MG tablet, Take 1,000 mg by mouth every 6 (six) hours as needed for headache (pain)., Disp: , Rfl:  .  albuterol (VENTOLIN HFA) 108 (90 Base) MCG/ACT inhaler, Inhale 1-2 puffs into the lungs every 6 (six) hours as needed for wheezing or shortness of breath., Disp: 8 g, Rfl: 0 .  cyclobenzaprine (FLEXERIL) 5 MG tablet, Take 1 tablet (5  mg total) by mouth 3 (three) times daily as needed for muscle spasms., Disp: 30 tablet, Rfl: 0 .  ibuprofen (ADVIL) 200 MG tablet, Take 800 mg by mouth every 6 (six) hours as needed (muscle pain). , Disp: , Rfl:  .  Multiple Vitamin (MULTIVITAMIN WITH MINERALS) TABS tablet, Take 1 tablet by mouth at bedtime., Disp: , Rfl:     Review of Systems  Constitutional: Positive for appetite change, fatigue and unexpected weight change. Negative for activity change, chills, diaphoresis and fever.  HENT: Negative for congestion, rhinorrhea, sinus pressure, sneezing, sore  throat and trouble swallowing.   Eyes: Negative for photophobia and visual disturbance.  Respiratory: Negative for cough, chest tightness, shortness of breath, wheezing and stridor.   Cardiovascular: Negative for chest pain, palpitations and leg swelling.  Gastrointestinal: Positive for abdominal pain and diarrhea. Negative for anal bleeding, blood in stool, constipation, nausea and vomiting.  Genitourinary: Positive for vaginal bleeding. Negative for difficulty urinating, dysuria, flank pain and hematuria.  Musculoskeletal: Positive for arthralgias. Negative for back pain, gait problem, joint swelling and myalgias.  Skin: Negative for color change, pallor, rash and wound.  Neurological: Negative for dizziness, tremors, weakness and light-headedness.  Hematological: Negative for adenopathy. Does not bruise/bleed easily.  Psychiatric/Behavioral: Negative for agitation, behavioral problems, confusion, decreased concentration, dysphoric mood and sleep disturbance.       Objective:   Physical Exam Constitutional:      General: She is not in acute distress.    Appearance: Normal appearance. She is well-developed. She is not ill-appearing or diaphoretic.  HENT:     Head: Normocephalic and atraumatic.     Right Ear: Hearing and external ear normal.     Left Ear: Hearing and external ear normal.     Nose: No nasal deformity or  rhinorrhea.  Eyes:     General: No scleral icterus.    Conjunctiva/sclera: Conjunctivae normal.     Right eye: Right conjunctiva is not injected.     Left eye: Left conjunctiva is not injected.     Pupils: Pupils are equal, round, and reactive to light.  Neck:     Vascular: No JVD.  Cardiovascular:     Rate and Rhythm: Normal rate and regular rhythm.     Heart sounds: Normal heart sounds, S1 normal and S2 normal. No murmur. No friction rub.  Pulmonary:     Effort: Pulmonary effort is normal. No respiratory distress.  Abdominal:     General: There is no distension.     Palpations: Abdomen is soft.  Musculoskeletal:        General: Normal range of motion.     Right shoulder: Normal.     Left shoulder: Normal.     Cervical back: Normal range of motion and neck supple.     Right hip: Normal.     Left hip: Normal.     Right knee: Normal.     Left knee: Normal.  Lymphadenopathy:     Head:     Right side of head: No submandibular, preauricular or posterior auricular adenopathy.     Left side of head: No submandibular, preauricular or posterior auricular adenopathy.     Cervical: No cervical adenopathy.     Right cervical: No superficial or deep cervical adenopathy.    Left cervical: No superficial or deep cervical adenopathy.  Skin:    General: Skin is warm and dry.     Coloration: Skin is not pale.     Findings: No abrasion, bruising, ecchymosis, erythema, lesion or rash.     Nails: There is no clubbing.  Neurological:     General: No focal deficit present.     Mental Status: She is alert and oriented to person, place, and time.     Sensory: No sensory deficit.     Coordination: Coordination normal.     Gait: Gait normal.  Psychiatric:        Attention and Perception: She is attentive.        Mood and Affect: Mood normal.        Speech:  Speech normal.        Behavior: Behavior normal. Behavior is cooperative.        Thought Content: Thought content normal.        Judgment:  Judgment normal.           Assessment & [name]:   History of what was thought to be a parasitic infection:   She does not give a history epidemiologically to point to a particular parasitic infection but she has clear memory of what seemed to be warm to her and eggs that she and her mother both observed.  She has not observed any further worms or eggs since that time.  I will nonetheless have her repeat a stool ova and parasite exam.  Abdominal cramping and pain.  Weight loss Food intolerance: We will check celiac antibodies with TTG we will check anti-Saccharomyces CeraVe CI antibody ANCA sed rate CRP and prealbumin  She is going to be seeing Tye Savoy from GI, perhaps allergist migbht also be of help  Ultrasound is scheduled as well.  I would think EGD might be helpful  Weight loss: Seems related to her GI symptoms and difficulty tolerating multiple foods.  In addition to GI work-up above also screening for HIV and viral hepatitides.

## 2020-02-28 LAB — MPO/PR-3 (ANCA) ANTIBODIES
Myeloperoxidase Abs: <1 AI
Serine Protease 3: <1 AI

## 2020-02-28 LAB — SEDIMENTATION RATE: Sed Rate: 11 mm/h (ref 0–20)

## 2020-02-28 LAB — SACCHAROMYCES CEREVISIAE ANTIBODIES, IGG AND IGA
SACCHAROMYCES CEREVISIAE AB (ASCA)(IGA): 5.5 U (ref <=20.0)
SACCHAROMYCES CEREVISIAE AB (ASCA)(IGG): 5.9 U (ref <=20.0)

## 2020-02-28 LAB — TISSUE TRANSGLUTAMINASE ABS,IGG,IGA
(tTG) Ab, IgA: 1 U/mL
(tTG) Ab, IgG: 1 U/mL

## 2020-02-28 LAB — HEPATITIS C ANTIBODY
Hepatitis C Ab: NONREACTIVE
SIGNAL TO CUT-OFF: 0.01 (ref <1.00)

## 2020-02-28 LAB — HIV ANTIBODY (ROUTINE TESTING W REFLEX): HIV 1&2 Ab, 4th Generation: NONREACTIVE

## 2020-02-28 LAB — HEPATITIS B SURFACE ANTIGEN: Hepatitis B Surface Ag: NONREACTIVE

## 2020-02-28 LAB — C-REACTIVE PROTEIN: CRP: 0.5 mg/L (ref <8.0)

## 2020-02-28 LAB — PREALBUMIN: Prealbumin: 25 mg/dL (ref 17–34)

## 2020-02-28 LAB — HEPATITIS B SURFACE ANTIBODY,QUALITATIVE: Hep B S Ab: NONREACTIVE

## 2020-03-01 ENCOUNTER — Other Ambulatory Visit: Payer: Self-pay

## 2020-03-01 ENCOUNTER — Ambulatory Visit (HOSPITAL_COMMUNITY)
Admission: RE | Admit: 2020-03-01 | Discharge: 2020-03-01 | Disposition: A | Discharge status: Home / Self Care | Payer: Medicaid Other | Source: Ambulatory Visit | Attending: Family Medicine | Admitting: Family Medicine

## 2020-03-01 DIAGNOSIS — R1011 Right upper quadrant pain: Secondary | ICD-10-CM | POA: Diagnosis not present

## 2020-03-03 ENCOUNTER — Ambulatory Visit: Payer: Medicaid Other | Admitting: Nurse Practitioner

## 2020-03-03 ENCOUNTER — Encounter: Payer: Self-pay | Admitting: Nurse Practitioner

## 2020-03-03 VITALS — BP 110/80 | HR 70 | Temp 97.6°F | Ht 62.6 in | Wt 104.5 lb

## 2020-03-03 DIAGNOSIS — R110 Nausea: Secondary | ICD-10-CM

## 2020-03-03 DIAGNOSIS — R636 Underweight: Secondary | ICD-10-CM | POA: Diagnosis not present

## 2020-03-03 DIAGNOSIS — R109 Unspecified abdominal pain: Secondary | ICD-10-CM

## 2020-03-03 DIAGNOSIS — R197 Diarrhea, unspecified: Secondary | ICD-10-CM | POA: Diagnosis not present

## 2020-03-03 DIAGNOSIS — K5900 Constipation, unspecified: Secondary | ICD-10-CM | POA: Diagnosis not present

## 2020-03-03 DIAGNOSIS — K3184 Gastroparesis: Secondary | ICD-10-CM

## 2020-03-03 MED ORDER — NA SULFATE-K SULFATE-MG SULF 17.5-3.13-1.6 GM/177ML PO SOLN: 1.0000 | Freq: Once | ORAL | 0 refills | Status: AC

## 2020-03-03 NOTE — Patient Instructions (Signed)
If you are age 33 or older, your body mass index should be between 23-30. Your Body mass index is 18.75 kg/m. If this is out of the aforementioned range listed, please consider follow up with your Primary Care Provider.  If you are age 31 or younger, your body mass index should be between 19-25. Your Body mass index is 18.75 kg/m. If this is out of the aformentioned range listed, please consider follow up with your Primary Care Provider.   You have been scheduled for an endoscopy and colonoscopy. Please follow the written instructions given to you at your visit today. Please pick up your prep supplies at the pharmacy within the next 1-3 days. If you use inhalers (even only as needed), please bring them with you on the day of your procedure.  Follow up pending the results of you procedure.

## 2020-03-03 NOTE — Progress Notes (Signed)
ASSESSMENT / PLAN:   34 year old female with PMH significant for asthma, anxiety, depression, ADHD, gastroparesis, chronic headaches.  Presents with multiple GI complaints  # Weight loss / nausea / early satiety / constipation / diarrhea/ chronic abdominal pain/ multiple food intolerances / low grade fevers / reports of recent parasite infection.  --2-hour gastric emptying scan in 2009 did show delayed gastric emptying with 50% retention at 2 hours.  Complains of early satiety. She is not on a promotility agent.  Consider trial of Reglan at some point.  This should be done cautiously given patient's history of bipolar/ADHD --Chronic frequent generalized abdominal cramping worse after eating, not improved with bowel movements.  --Constipation alternating with explosive diarrhea.  Has been taking fiber for 2 months without improvement.  --Recently seen in ED with complaints of worms and eggs in stool.  Stool study not obtained but treated with albendazole.  Following that she was seen by ID who did order an O&P but patient has not been able to find a ride to complete the study. She hasn't seen any worms since treatment --RUQ ultrasound, CMP, CBC, thyroid studies all normal within the last couple of months.  Labs ordered by ID were also negative (see HPI) --With multiple upper and lower GI symptoms I think at this point it is reasonable to proceed with an EGD and colonoscopy.  Studies may be low yield, workup thus far has been unrevealing. Patient will be scheduled for an EGD and colonoscopy. The risks and benefits of colonoscopy with possible polypectomy / biopsies were discussed and the patient agrees to proceed.  Her father has recently retired and can provide transportation to procedures -- Asked her to please complete stool studies already ordered by ID   HPI:     Chief Complaint: Weight loss, loose stool, constipation, gastroparesis, anorexia, fever, abdominal pain      [name]  Though her weight has been stable over the last year patient says she has to work hard to maintain weight above 100 pounds  Seen in ED fr evaluation of what appeared to be a flat worm and eggs in her stool.  No stool studies were done. She was given albensazole. Following that she did not see any worms but did see what looked like eggs.  She was apparently having some fevers at the time.  She was referred to Infectious Disease, saw Dr. Tommy Medal on 02/23/2020, for history of parasitic infection though he had no record of that. He ordered several labs which was all normal ( see below).   Fanny Skates says she has had GI problems/chronic pain dating back to childhood.  She has frequent generalized cramping in her abdomen which is worse with meals, unrelieved with bowel movements.  After recent RUQ ultrasound she had such pain in the area that she almost could not move for several hours. She has frequent nausea despite eating small portions at a time.  She gives a history of a parasitic infection 2 years ago treated at an ED. since then she has had other explosive diarrhea or constipation though never goes more than 2 days without a bowel movement.  She started fiber 2 months ago, so far no improvement . No blood in her stool.  She says mother and possibly grandmother have IBS.  No known Crohn's disease in the family.  Patient lost 30 pounds for unclear reasons between year 2019 and 2020 but over the  last year her her weight has been stable.  However, she has a hard time keeping her weight above 100 pounds  Data Reviewed:  celiac studies, ANCA , ESR, CRP, anti-Saccharomyces Cerave Cl ab,  HIV, viral hepatitis studies.  Prealbumin normal. Thyroid studies WNL.   Past Medical History:  Diagnosis Date  . ADHD (attention deficit hyperactivity disorder)   . Anemia   . Asthma   . Bipolar disorder ([place])   . Delayed gastric emptying 02/23/2020  . Depression   . DUB (dysfunctional uterine  bleeding) 02/23/2020  . GERD (gastroesophageal reflux disease)   . Parasitic infection 02/23/2020  . Poor appetite 02/23/2020  . RUQ pain 02/23/2020  . Weight loss 02/23/2020     Past Surgical History:  Procedure Laterality Date  . ESOPHAGOGASTRODUODENOSCOPY  2011   and a ph study as well.   [name] Kitchen NASAL SEPTUM SURGERY    . TONGUE FLAP    . WISDOM TOOTH EXTRACTION     Family History  Problem Relation Age of Onset  . Depression Mother   . Anxiety disorder Mother   . Hypertension Mother   . Hyperlipidemia Mother   . Asthma Mother   . Depression Father   . Hypertension Father   . Hyperlipidemia Father   . Bipolar disorder Father   . Bipolar disorder Sister   . ADD / ADHD Sister   . Asthma Sister    Social History   Tobacco Use  . Smoking status: Never Smoker  . Smokeless tobacco: Never Used  Substance Use Topics  . Alcohol use: No  . Drug use: No   Current Outpatient Medications  Medication Sig Dispense Refill  . acetaminophen (TYLENOL) 500 MG tablet Take 1,000 mg by mouth every 6 (six) hours as needed for headache (pain).    [name] Kitchen albuterol (VENTOLIN HFA) 108 (90 Base) MCG/ACT inhaler Inhale 1-2 puffs into the lungs every 6 (six) hours as needed for wheezing or shortness of breath. 8 g 0  . drospirenone-ethinyl estradiol (YAZ) 3-0.02 MG tablet Take 1 tablet by mouth daily. 1 Package 2  . fexofenadine (ALLEGRA) 180 MG tablet Take 180 mg by mouth daily.    [name] Kitchen ibuprofen (ADVIL) 200 MG tablet Take 800 mg by mouth every 6 (six) hours as needed (muscle pain).     . Multiple Vitamin (MULTIVITAMIN WITH MINERALS) TABS tablet Take 1 tablet by mouth at bedtime.     No current facility-administered medications for this visit.   Allergies  Allergen Reactions  . Amphetamine-Dextroamphetamine Other (See Comments)    Unknown reaction  . Penicillins Swelling    Severe allergy per allergy test Did it involve swelling of the face/tongue/throat, SOB, or low BP? Yes Did it involve sudden or  severe rash/hives, skin peeling, or any reaction on the inside of your mouth or nose? Unknown Did you need to seek medical attention at a hospital or doctor's office? Unknown When did it last happen?teenager If all above answers are "NO", may proceed with cephalosporin use.  [name] Kitchen Doxycycline Itching, Nausea And Vomiting and Other (See Comments)    Caused high fever  . Olanzapine Other (See Comments)    Unknown reaction  . Other     Stimulants cause unknown reaction  . Prozac [Fluoxetine Hcl] Other (See Comments)    Unknown reaction     Review of Systems: Positive for anxiety, fatigue, fever, headaches, menstrual pain, muscle pain and cramps, sleeping problems.  All other systems reviewed and negative except where noted in HPI.  Creatinine clearance cannot be calculated (Patient's most recent lab result is older than the maximum 21 days allowed.)   Physical Exam:    Wt Readings from Last 3 Encounters:  03/03/20 104 lb 8 oz (47.4 kg)  02/23/20 99 lb 6.4 oz (45.1 kg)  02/04/20 104 lb 6.4 oz (47.4 kg)    BP 110/80   Pulse 70   Temp 97.6 F (36.4 C)   Ht 5' 2.6" (1.59 m)   Wt 104 lb 8 oz (47.4 kg)   BMI 18.75 kg/m  Constitutional:  Pleasant thin female in no acute distress. Psychiatric: Normal mood and affect. Behavior is normal. EENT: Pupils normal.  Conjunctivae are normal. No scleral icterus. Neck supple.  Cardiovascular: Normal rate, regular rhythm. No edema Pulmonary/chest: Effort normal and breath sounds normal. No wheezing, rales or rhonchi. Abdominal: Soft, nondistended, nontender. Bowel sounds active throughout. There are no masses palpable. No hepatomegaly. Neurological: Alert and oriented to person place and time. Skin: Skin is warm and dry. No rashes noted.  Tye Savoy, NP  03/03/2020, 8:51 AM  Cc:  Referring Provider Antony Blackbird, MD

## 2020-03-03 NOTE — Progress Notes (Signed)
If patient is willing, should consider ordering 4 hr gastric emptying scan.  2 hr gastric emptying scan is not very well validated and results are not reliable. I will discuss with patient when she comes in for her procedures.  Reviewed and agree with documentation and assessment and plan. Damaris Hippo , MD

## 2020-03-09 ENCOUNTER — Encounter: Payer: Self-pay | Admitting: Obstetrics and Gynecology

## 2020-03-09 ENCOUNTER — Other Ambulatory Visit: Payer: Self-pay

## 2020-03-09 ENCOUNTER — Other Ambulatory Visit (HOSPITAL_COMMUNITY)
Admission: RE | Admit: 2020-03-09 | Discharge: 2020-03-09 | Disposition: A | Discharge status: Home / Self Care | Payer: Medicaid Other | Source: Ambulatory Visit | Attending: Obstetrics and Gynecology | Admitting: Obstetrics and Gynecology

## 2020-03-09 ENCOUNTER — Ambulatory Visit: Payer: Medicaid Other | Admitting: Obstetrics and Gynecology

## 2020-03-09 VITALS — BP 108/76 | HR 81 | Ht 62.0 in | Wt 101.7 lb

## 2020-03-09 DIAGNOSIS — Z3202 Encounter for pregnancy test, result negative: Secondary | ICD-10-CM

## 2020-03-09 DIAGNOSIS — Z30017 Encounter for initial prescription of implantable subdermal contraceptive: Secondary | ICD-10-CM

## 2020-03-09 DIAGNOSIS — Z01419 Encounter for gynecological examination (general) (routine) without abnormal findings: Secondary | ICD-10-CM | POA: Diagnosis not present

## 2020-03-09 LAB — POCT URINE PREGNANCY: Preg Test, Ur: NEGATIVE

## 2020-03-09 MED ORDER — ETONOGESTREL 68 MG ~~LOC~~ IMPL
68.0000 mg | DRUG_IMPLANT | Freq: Once | SUBCUTANEOUS | Status: AC
  Administered 2020-03-09: 68 mg via SUBCUTANEOUS

## 2020-03-09 NOTE — Progress Notes (Signed)
Subjective:     Rachel [name] is a 34 y.o. female P0 with BMI 18 who is here for a comprehensive physical exam. The patient reports daily vaginal bleeding since January. She was recently started on Yaz with improvement in her bleeding, now reduced to spotting. Patient is interested in changing contraception to nexplanon. She is not currently sexually active. She is in a long distance relationship with plans to relocate to [place]. Patient is currently being evaluated by GI for chronic abdominal pain. Patient is without any other complaints. She denies urinary symptoms or abnormal discharge  Past Medical History:  Diagnosis Date  . ADHD (attention deficit hyperactivity disorder)   . Anemia   . Asthma   . Bipolar disorder ([place])   . Delayed gastric emptying 02/23/2020  . Depression   . DUB (dysfunctional uterine bleeding) 02/23/2020  . GERD (gastroesophageal reflux disease)   . Parasitic infection 02/23/2020  . Poor appetite 02/23/2020  . RUQ pain 02/23/2020  . Weight loss 02/23/2020   Past Surgical History:  Procedure Laterality Date  . ESOPHAGOGASTRODUODENOSCOPY  2011   and a ph study as well.   [name] Kitchen NASAL SEPTUM SURGERY    . TONGUE FLAP    . WISDOM TOOTH EXTRACTION     Family History  Problem Relation Age of Onset  . Depression Mother   . Anxiety disorder Mother   . Hypertension Mother   . Hyperlipidemia Mother   . Asthma Mother   . Depression Father   . Hypertension Father   . Hyperlipidemia Father   . Bipolar disorder Father   . Bipolar disorder Sister   . ADD / ADHD Sister   . Asthma Sister     Social History   Socioeconomic History  . Marital status: Divorced    Spouse name: Not on file  . Number of children: Not on file  . Years of education: Not on file  . Highest education level: Not on file  Occupational History  . Not on file  Tobacco Use  . Smoking status: Never Smoker  . Smokeless tobacco: Never Used  Substance and Sexual Activity  . Alcohol use: No  .  Drug use: No  . Sexual activity: Yes    Birth control/protection: Condom  Other Topics Concern  . Not on file  Social History Narrative  . Not on file   Social Determinants of Health   Financial Resource Strain:   . Difficulty of Paying Living Expenses:   Food Insecurity:   . Worried About Charity fundraiser in the Last Year:   . Arboriculturist in the Last Year:   Transportation Needs:   . Film/video editor (Medical):   [name] Kitchen Lack of Transportation (Non-Medical):   Physical Activity:   . Days of Exercise per Week:   . Minutes of Exercise per Session:   Stress:   . Feeling of Stress :   Social Connections:   . Frequency of Communication with Friends and Family:   . Frequency of Social Gatherings with Friends and Family:   . Attends Religious Services:   . Active Member of Clubs or Organizations:   . Attends Archivist Meetings:   [name] Kitchen Marital Status:   Intimate Partner Violence:   . Fear of Current or Ex-Partner:   . Emotionally Abused:   [name] Kitchen Physically Abused:   . Sexually Abused:    Health Maintenance  Topic Date Due  . TETANUS/TDAP  Never done  . PAP SMEAR-Modifier  09/29/2016  .  COVID-19 Vaccine (2 - Pfizer 2-dose series) 03/11/2020  . INFLUENZA VACCINE  06/13/2020  . HIV Screening  Completed       Review of Systems Pertinent items noted in HPI and remainder of comprehensive ROS otherwise negative.   Objective:  Blood pressure 108/76, pulse 81, height 5\' 2"  (1.575 m), weight 101 lb 11.2 oz (46.1 kg), last menstrual period 03/09/2020.      GENERAL: Well-developed, well-nourished female in no acute distress.  HEENT: Normocephalic, atraumatic. Sclerae anicteric.  NECK: Supple. Normal thyroid.  LUNGS: Clear to auscultation bilaterally.  HEART: Regular rate and rhythm. BREASTS: Symmetric in size. No palpable masses or lymphadenopathy, skin changes, or nipple drainage. ABDOMEN: Soft, nontender, nondistended. No organomegaly. PELVIC: Normal external  female genitalia. Vagina is pink and rugated.  Normal discharge. Normal appearing cervix. Uterus is normal in size. No adnexal mass or tenderness. EXTREMITIES: No cyanosis, clubbing, or edema, 2+ distal pulses.  01/2020 ultrasound FINDINGS: Uterus  Measurements: 6.9 x 4.1 x 5.4 cm = volume: 81 mL. No fibroids or other mass visualized.  Endometrium  Thickness: 12 mm.  No focal abnormality visualized.  Right ovary  Measurements: 3.6 x 2.6 x 2.7 cm = volume: 13.1 mL. 2 cm corpus luteum noted. No ovarian or adnexal mass.  Left ovary  Measurements: 2.7 x 1.6 x 2.4 cm = volume: 5.6 mL. Normal appearance/no adnexal mass.  Pulsed Doppler evaluation of both ovaries demonstrates normal low-resistance arterial and venous waveforms.  Other findings  No abnormal free fluid.  IMPRESSION: No pelvic mass or other significant abnormality identified.  No sonographic evidence for ovarian torsion.   Electronically Signed   By: Marlaine Hind M.D.   On: 01/16/2020 18:02   Assessment:    Healthy female exam.      Plan:    Pap smear collected Informed patient that 1/10 women will experience irregular vaginal bleeding with Nexplanon without affecting its contraceptive effectiveness. Patient verbalized understanding and wishes to proceed. Informed patient that if irregular bleeding were to occur, a short term course of COC may be beneficial  Patient given informed consent, signed copy in the chart, time out was performed. Pregnancy test was negative. Appropriate time out taken.  Patient's left arm was prepped and draped in the usual sterile fashion.. The ruler used to measure and mark insertion area.  Patient was prepped with alcohol swab and then injected with 2 cc of 1% lidocaine with epinephrine.  Patient was prepped with betadine, Nexplanon removed form packaging.  Device confirmed in needle, then inserted full length of needle and withdrawn per handbook instructions.   Patient insertion site covered with band-aid and coband.   Minimal blood loss.  Patient tolerated the procedure well.   Patient will be contacted with abnormal results  See After Visit Summary for Counseling Recommendations

## 2020-03-09 NOTE — Progress Notes (Signed)
Pt is here for initial visit as new patient. Pt reports she is unsure of last pap smear. Pt reports she has been bleeding every day since January and she reports she sometimes goes through multiple super tampons per hour. Pt also reports unexplained weight loss. Pt is taking Yaz currently, but reports she is not sexually active.

## 2020-03-11 LAB — CYTOLOGY - PAP
Comment: NEGATIVE
Diagnosis: NEGATIVE
High risk HPV: NEGATIVE

## 2020-03-17 ENCOUNTER — Ambulatory Visit: Payer: Medicaid Other | Attending: Internal Medicine

## 2020-03-17 DIAGNOSIS — Z23 Encounter for immunization: Secondary | ICD-10-CM

## 2020-03-17 NOTE — Progress Notes (Signed)
   Covid-19 Vaccination Clinic  Name:  Rachel [name]    MRN: [id number] DOB: 1986/05/29  03/17/2020  [name] was observed post Covid-19 immunization for 15 minutes without incident. She was provided with Vaccine Information Sheet and instruction to access the V-Safe system.   [name] was instructed to call 911 with any severe reactions post vaccine: [name] Kitchen Difficulty breathing  . Swelling of face and throat  . A fast heartbeat  . A bad rash all over body  . Dizziness and weakness   Immunizations Administered    Name Date Dose VIS Date Route   Pfizer COVID-19 Vaccine 03/17/2020 12:50 PM 0.3 mL 01/07/2019 Intramuscular   Manufacturer: [employer]   Lot: [id number]   [place]: [id number]

## 2020-03-18 ENCOUNTER — Encounter: Payer: Self-pay | Admitting: Family Medicine

## 2020-03-18 ENCOUNTER — Other Ambulatory Visit: Payer: Self-pay

## 2020-03-18 ENCOUNTER — Ambulatory Visit: Payer: Medicaid Other | Attending: Family Medicine | Admitting: Family Medicine

## 2020-03-18 VITALS — BP 126/85 | HR 75 | Temp 97.9°F | Ht 62.0 in | Wt 102.0 lb

## 2020-03-18 DIAGNOSIS — R1084 Generalized abdominal pain: Secondary | ICD-10-CM

## 2020-03-18 DIAGNOSIS — K5900 Constipation, unspecified: Secondary | ICD-10-CM | POA: Diagnosis not present

## 2020-03-18 DIAGNOSIS — R102 Pelvic and perineal pain: Secondary | ICD-10-CM | POA: Diagnosis not present

## 2020-03-18 DIAGNOSIS — R110 Nausea: Secondary | ICD-10-CM | POA: Diagnosis not present

## 2020-03-18 DIAGNOSIS — M791 Myalgia, unspecified site: Secondary | ICD-10-CM

## 2020-03-18 DIAGNOSIS — Z79899 Other long term (current) drug therapy: Secondary | ICD-10-CM | POA: Diagnosis not present

## 2020-03-18 DIAGNOSIS — R1024 Suprapubic pain: Secondary | ICD-10-CM

## 2020-03-18 DIAGNOSIS — J45909 Unspecified asthma, uncomplicated: Secondary | ICD-10-CM | POA: Insufficient documentation

## 2020-03-18 LAB — POCT URINALYSIS DIP (CLINITEK)
Bilirubin, UA: NEGATIVE
Blood, UA: NEGATIVE
Glucose, UA: NEGATIVE mg/dL
Ketones, POC UA: NEGATIVE mg/dL
Leukocytes, UA: NEGATIVE
Nitrite, UA: NEGATIVE
POC PROTEIN,UA: NEGATIVE
Spec Grav, UA: >=1.03 — AB
Urobilinogen, UA: 0.2 U/dL
pH, UA: 5.5

## 2020-03-18 NOTE — Progress Notes (Signed)
Established Patient Office Visit  Subjective:  Patient ID: Rachel [name], female    DOB: [birth year]  Age: 34 y.o. MRN: [id number]  CC: f/u abdominal pain-C. Brandn Mcgath, MD  HPI Rachel [name], 34 year old female, who is status post new patient visit on 02/04/2020 to establish care status post emergency department visit on 01/16/2020 due to dysfunctional uterine bleeding and patient with additional complaints of difficulty maintaining weight, abdominal cramping, right upper quadrant pain and history of parasitic infection.  Since her last visit, she has seen infectious disease in follow-up of her history of parasitic infection on 02/23/2020, gastroenterology on 03/03/2020 and GYN on 03/09/2020.  She has been asked to complete stool studies by infectious disease as well as blood work and GI is recommending that patient be scheduled for EGD and colonoscopy due to her multiple GI complaints and she had Pap smear along with insertion of Nexplanon done per GYN.  Pelvic ultrasound was done by GYN which was negative/showed no abnormalities.  Right upper quadrant ultrasound was done on 03/01/2020 and was also normal.        She reports continued issues with generalized abdominal pain, decreased appetite and recurrent nausea.  She reports issues with being able to return a stool sample as requested by infectious disease to help determine if she still has any evidence of parasitic infection, due to transportation issues.  She also has issues with alternating periods of constipation as well as diarrhea and this impairs her ability to know when she will be able to give a stool sample.  She denies any blood in the stool or black stools and has seen no recent parasites/eggs in her stool. She also feels as if she has  generalized muscle aches.  She said feels as if she is constantly achy/having pain in her muscles.  Past Medical History:  Diagnosis Date  . ADHD (attention deficit hyperactivity disorder)   . Anemia     . Asthma   . Bipolar disorder ([place])   . Delayed gastric emptying 02/23/2020  . Depression   . DUB (dysfunctional uterine bleeding) 02/23/2020  . GERD (gastroesophageal reflux disease)   . Parasitic infection 02/23/2020  . Poor appetite 02/23/2020  . RUQ pain 02/23/2020  . Weight loss 02/23/2020    Past Surgical History:  Procedure Laterality Date  . ESOPHAGOGASTRODUODENOSCOPY  2011   and a ph study as well.   [name] Kitchen NASAL SEPTUM SURGERY    . TONGUE FLAP    . WISDOM TOOTH EXTRACTION      Family History  Problem Relation Age of Onset  . Depression Mother   . Anxiety disorder Mother   . Hypertension Mother   . Hyperlipidemia Mother   . Asthma Mother   . Endometriosis Mother   . Fibroids Mother   . Depression Father   . Hypertension Father   . Hyperlipidemia Father   . Bipolar disorder Father   . Bipolar disorder Sister   . ADD / ADHD Sister   . Asthma Sister   . Endometriosis Sister   . Fibroids Sister   . Endometriosis Maternal Grandmother   . Fibroids Maternal Grandmother     Social History   Socioeconomic History  . Marital status: Divorced    Spouse name: Not on file  . Number of children: Not on file  . Years of education: Not on file  . Highest education level: Not on file  Occupational History  . Not on file  Tobacco Use  . Smoking status: Never Smoker  .  Smokeless tobacco: Never Used  Substance and Sexual Activity  . Alcohol use: No  . Drug use: No  . Sexual activity: Not Currently  Other Topics Concern  . Not on file  Social History Narrative  . Not on file   Social Determinants of Health   Financial Resource Strain:   . Difficulty of Paying Living Expenses:   Food Insecurity:   . Worried About Charity fundraiser in the Last Year:   . Arboriculturist in the Last Year:   Transportation Needs:   . Film/video editor (Medical):   [name] Kitchen Lack of Transportation (Non-Medical):   Physical Activity:   . Days of Exercise per Week:   . Minutes of  Exercise per Session:   Stress:   . Feeling of Stress :   Social Connections:   . Frequency of Communication with Friends and Family:   . Frequency of Social Gatherings with Friends and Family:   . Attends Religious Services:   . Active Member of Clubs or Organizations:   . Attends Archivist Meetings:   [name] Kitchen Marital Status:   Intimate Partner Violence:   . Fear of Current or Ex-Partner:   . Emotionally Abused:   [name] Kitchen Physically Abused:   . Sexually Abused:     Outpatient Medications Prior to Visit  Medication Sig Dispense Refill  . naproxen sodium (ALEVE) 220 MG tablet Take 220 mg by mouth 2 (two) times daily.    . polycarbophil (FIBERCON) 625 MG tablet Take 625 mg by mouth daily.    [name] Kitchen acetaminophen (TYLENOL) 500 MG tablet Take 1,000 mg by mouth every 6 (six) hours as needed for headache (pain).    [name] Kitchen albuterol (VENTOLIN HFA) 108 (90 Base) MCG/ACT inhaler Inhale 1-2 puffs into the lungs every 6 (six) hours as needed for wheezing or shortness of breath. 8 g 0  . drospirenone-ethinyl estradiol (YAZ) 3-0.02 MG tablet Take 1 tablet by mouth daily. (Patient not taking: Reported on 03/18/2020) 1 Package 2  . fexofenadine (ALLEGRA) 180 MG tablet Take 180 mg by mouth daily.    [name] Kitchen ibuprofen (ADVIL) 200 MG tablet Take 800 mg by mouth every 6 (six) hours as needed (muscle pain).     . Multiple Vitamin (MULTIVITAMIN WITH MINERALS) TABS tablet Take 1 tablet by mouth at bedtime.    . psyllium (METAMUCIL) 58.6 % packet Take 1 packet by mouth daily.     No facility-administered medications prior to visit.    Allergies  Allergen Reactions  . Amphetamine-Dextroamphetamine Other (See Comments)    Unknown reaction  . Penicillins Swelling    Severe allergy per allergy test Did it involve swelling of the face/tongue/throat, SOB, or low BP? Yes Did it involve sudden or severe rash/hives, skin peeling, or any reaction on the inside of your mouth or nose? Unknown Did you need to seek medical  attention at a hospital or doctor's office? Unknown When did it last happen?teenager If all above answers are "NO", may proceed with cephalosporin use.  [name] Kitchen Doxycycline Itching, Nausea And Vomiting and Other (See Comments)    Caused high fever  . Olanzapine Other (See Comments)    Unknown reaction  . Other     Stimulants cause unknown reaction  . Prozac [Fluoxetine Hcl] Other (See Comments)    Unknown reaction    ROS Review of Systems  Constitutional: Positive for fatigue. Negative for chills and fever.  HENT: Negative for sore throat and trouble swallowing.   Eyes: Negative  for photophobia and visual disturbance.  Respiratory: Negative for cough and shortness of breath.   Cardiovascular: Positive for palpitations (Occasional). Negative for chest pain.  Gastrointestinal: Positive for abdominal pain, constipation, diarrhea and nausea. Negative for blood in stool.       Alternating episodes of constipation and loose stools/diarrhea  Endocrine: Negative for polydipsia, polyphagia and polyuria.  Genitourinary: Negative for dysuria and frequency.  Musculoskeletal: Positive for myalgias. Negative for arthralgias.  Neurological: Negative for dizziness and headaches.  Hematological: Negative for adenopathy. Does not bruise/bleed easily.  Psychiatric/Behavioral: Negative for self-injury and suicidal ideas.      Objective:    Physical Exam  Constitutional: She is oriented to person, place, and time. She appears well-developed.  Cardiovascular: Normal rate and regular rhythm.  Pulmonary/Chest: Effort normal and breath sounds normal.  Abdominal: Soft. There is abdominal tenderness. There is no rebound and no guarding.  Generalized abdominal tenderness and suprapubic tenderness to palpation, no rebound or guarding  Musculoskeletal:        General: No tenderness or edema.     Cervical back: Normal range of motion and neck supple.  Lymphadenopathy:    She has no cervical adenopathy.    Neurological: She is alert and oriented to person, place, and time.  Skin: Skin is warm and dry.  Psychiatric:  Slightly flattened affect  Nursing note and vitals reviewed.  BP 126/85   Pulse 75   Temp 97.9 F (36.6 C) (Temporal)   Ht 5\' 2"  (1.575 m)   Wt 102 lb (46.3 kg)   LMP 03/09/2020 (Approximate)   SpO2 100%   BMI 18.66 kg/m  BP 126/85   Pulse 75   Temp 97.9 F (36.6 C) (Temporal)   Ht 5\' 2"  (1.575 m)   Wt 102 lb (46.3 kg)   LMP 03/09/2020 (Approximate)   SpO2 100%   BMI 18.66 kg/m  Wt Readings from Last 3 Encounters:  03/18/20 102 lb (46.3 kg)  03/09/20 101 lb 11.2 oz (46.1 kg)  03/03/20 104 lb 8 oz (47.4 kg)     Health Maintenance Due  Topic Date Due  . TETANUS/TDAP  Never done     Lab Results  Component Value Date   TSH 1.760 02/04/2020   Lab Results  Component Value Date   WBC 7.5 01/16/2020   HGB 12.8 01/16/2020   HCT 37.7 01/16/2020   MCV 92.2 01/16/2020   PLT 228 01/16/2020   Lab Results  Component Value Date   NA 142 02/04/2020   K 4.5 02/04/2020   CO2 24 02/04/2020   GLUCOSE 77 02/04/2020   BUN 14 02/04/2020   CREATININE 0.72 02/04/2020   BILITOT 0.7 01/16/2020   ALKPHOS 36 (L) 01/16/2020   AST 16 01/16/2020   ALT 12 01/16/2020   PROT 6.6 01/16/2020   ALBUMIN 4.3 01/16/2020   CALCIUM 9.5 02/04/2020   ANIONGAP 8 01/16/2020   No results found for: CHOL No results found for: HDL No results found for: LDLCALC No results found for: TRIG No results found for: CHOLHDL No results found for: HGBA1C    Assessment & [name]:  1. Generalized abdominal pain She has been asked to follow-up with gastroenterolology regarding the further testing that was recommended as per her recent visit on 03/03/2020.  Patient does have a history of delayed gastric emptying on a 2-hour gastric emptying scan done in 2009. Per recent GI note, EGD and colonoscopy have been recommended. Patient additionally needs to complete the stool testing ordered by  infectious disease in follow-up of her complaint of prior parasitic infection.  She also reports continued issues with maintaining her weight and if gastroenterology work-up does not fully explain her weight loss symptoms, patient may benefit from reestablishing care with psychiatry as she is not currently being followed regarding her history of bipolar disorder and ADD.  2. Suprapubic abdominal pain Patient with suprapubic abdominal pain on examination and she has been asked to give urine sample for urinalysis and urine culture to see if urinary tract infection may be responsible for her suprapubic abdominal pain. - POCT URINALYSIS DIP (CLINITEK) - Urine Culture  3. Myalgia Patient with complaint of generalized body aches for which vitamin D level will be checked at today's visit as well as CK-MB.  She will be referred to rheumatology for further evaluation and treatment. - Vitamin D, 25-hydroxy - CKMB - Ambulatory referral to Rheumatology   An After Visit Summary was printed and given to the patient.   Follow-up: Return in about 6 weeks (around 04/29/2020) for chronic issues.    Antony Blackbird, MD

## 2020-03-18 NOTE — Progress Notes (Signed)
36.6 102lb   Weight loss  Pt have the Nexplanon

## 2020-03-19 ENCOUNTER — Other Ambulatory Visit: Payer: Self-pay | Admitting: Family Medicine

## 2020-03-19 DIAGNOSIS — E559 Vitamin D deficiency, unspecified: Secondary | ICD-10-CM

## 2020-03-19 LAB — CREATININE KINASE MB: CK-MB Index: <1 ng/mL (ref 0.0–5.3)

## 2020-03-19 LAB — VITAMIN D 25 HYDROXY (VIT D DEFICIENCY, FRACTURES): Vit D, 25-Hydroxy: 19.4 ng/mL — ABNORMAL LOW (ref 30.0–100.0)

## 2020-03-19 MED ORDER — VITAMIN D (ERGOCALCIFEROL) 1.25 MG (50000 UNIT) PO CAPS: 50000.0000 [IU] | ORAL_CAPSULE | ORAL | 0 refills | Status: DC

## 2020-03-19 NOTE — Progress Notes (Signed)
Patient ID: Rachel [name], female   DOB: Mar 01, 1986, 34 y.o.   MRN: [id number]   Patient with Vitamin D deficiency on recent blood work and new RX will be sent to her pharmacy for Vitamin D replacement therapy

## 2020-03-20 LAB — URINE CULTURE

## 2020-03-30 ENCOUNTER — Encounter: Payer: Self-pay | Admitting: Family Medicine

## 2020-04-01 ENCOUNTER — Ambulatory Visit (AMBULATORY_SURGERY_CENTER): Payer: Medicaid Other | Admitting: Gastroenterology

## 2020-04-01 ENCOUNTER — Other Ambulatory Visit: Payer: Self-pay

## 2020-04-01 ENCOUNTER — Encounter: Payer: Self-pay | Admitting: Gastroenterology

## 2020-04-01 VITALS — BP 109/76 | HR 76 | Resp 16 | Ht 62.0 in | Wt 102.0 lb

## 2020-04-01 DIAGNOSIS — K449 Diaphragmatic hernia without obstruction or gangrene: Secondary | ICD-10-CM

## 2020-04-01 DIAGNOSIS — R1084 Generalized abdominal pain: Secondary | ICD-10-CM

## 2020-04-01 DIAGNOSIS — K648 Other hemorrhoids: Secondary | ICD-10-CM | POA: Diagnosis not present

## 2020-04-01 DIAGNOSIS — R110 Nausea: Secondary | ICD-10-CM

## 2020-04-01 DIAGNOSIS — K219 Gastro-esophageal reflux disease without esophagitis: Secondary | ICD-10-CM | POA: Diagnosis not present

## 2020-04-01 DIAGNOSIS — K5900 Constipation, unspecified: Secondary | ICD-10-CM

## 2020-04-01 DIAGNOSIS — R197 Diarrhea, unspecified: Secondary | ICD-10-CM

## 2020-04-01 DIAGNOSIS — K2100 Gastro-esophageal reflux disease with esophagitis, without bleeding: Secondary | ICD-10-CM

## 2020-04-01 DIAGNOSIS — K6389 Other specified diseases of intestine: Secondary | ICD-10-CM | POA: Diagnosis not present

## 2020-04-01 MED ORDER — SODIUM CHLORIDE 0.9 % IV SOLN
500.0000 mL | INTRAVENOUS | Status: DC
Start: 2020-04-01 — End: 2020-04-01

## 2020-04-01 NOTE — Patient Instructions (Signed)
HANDOUTS PROVIDED ON: HIATAL HERNIA & HEMORRHOIDS  You may resume your previous diet and medication schedule.  Thank you for allowing [place] to care for you today!!!   YOU HAD AN ENDOSCOPIC PROCEDURE TODAY AT [facility]:   Refer to the procedure report that was given to you for any specific questions about what was found during the examination.  If the procedure report does not answer your questions, please call your gastroenterologist to clarify.  If you requested that your care partner not be given the details of your procedure findings, then the procedure report has been included in a sealed envelope for you to review at your convenience later.  YOU SHOULD EXPECT: Some feelings of bloating in the abdomen. Passage of more gas than usual.  Walking can help get rid of the air that was put into your GI tract during the procedure and reduce the bloating. If you had a lower endoscopy (such as a colonoscopy or flexible sigmoidoscopy) you may notice spotting of blood in your stool or on the toilet paper. If you underwent a bowel prep for your procedure, you may not have a normal bowel movement for a few days.  Please Note:  You might notice some irritation and congestion in your nose or some drainage.  This is from the oxygen used during your procedure.  There is no need for concern and it should clear up in a day or so.  SYMPTOMS TO REPORT IMMEDIATELY:   Following lower endoscopy (colonoscopy or flexible sigmoidoscopy):  Excessive amounts of blood in the stool  Significant tenderness or worsening of abdominal pains  Swelling of the abdomen that is new, acute  Fever of 100F or higher   Following upper endoscopy (EGD)  Vomiting of blood or coffee ground material  New chest pain or pain under the shoulder blades  Painful or persistently difficult swallowing  New shortness of breath  Fever of 100F or higher  Black, tarry-looking stools  For urgent or emergent issues, a  gastroenterologist can be reached at any hour by calling [phone]. Do not use MyChart messaging for urgent concerns.    DIET:  We do recommend a small meal at first, but then you may proceed to your regular diet.  Drink plenty of fluids but you should avoid alcoholic beverages for 24 hours.  ACTIVITY:  You should plan to take it easy for the rest of today and you should NOT DRIVE or use heavy machinery until tomorrow (because of the sedation medicines used during the test).    FOLLOW UP: Our staff will call the number listed on your records 48-72 hours following your procedure to check on you and address any questions or concerns that you may have regarding the information given to you following your procedure. If we do not reach you, we will leave a message.  We will attempt to reach you two times.  During this call, we will ask if you have developed any symptoms of COVID 19. If you develop any symptoms (ie: fever, flu-like symptoms, shortness of breath, cough etc.) before then, please call [phone].  If you test positive for Covid 19 in the 2 weeks post procedure, please call and report this information to [place].    If any biopsies were taken you will be contacted by phone or by letter within the next 1-3 weeks.  Please call us at [phone] if you have not heard about the biopsies in 3 weeks.    SIGNATURES/CONFIDENTIALITY:  You and/or your care partner have signed paperwork which will be entered into your electronic medical record.  These signatures attest to the fact that that the information above on your After Visit Summary has been reviewed and is understood.  Full responsibility of the confidentiality of this discharge information lies with you and/or your care-partner.

## 2020-04-01 NOTE — Op Note (Signed)
[place] Patient Name: Rachel [name] Procedure Date: 04/01/2020 2:08 PM MRN: [id number] Endoscopist: Mauri Pole , MD Age: 34 Referring MD:  Date of Birth: 02-Aug-1986 Gender: Female Account #: [id number] Procedure:                Colonoscopy Indications:              Generalized abdominal pain, Constipation Medicines:                Monitored Anesthesia Care Procedure:                Pre-Anesthesia Assessment:                           - Prior to the procedure, a History and Physical                            was performed, and patient medications and                            allergies were reviewed. The patient's tolerance of                            previous anesthesia was also reviewed. The risks                            and benefits of the procedure and the sedation                            options and risks were discussed with the patient.                            All questions were answered, and informed consent                            was obtained. Prior Anticoagulants: The patient has                            taken no previous anticoagulant or antiplatelet                            agents. ASA Grade Assessment: II - A patient with                            mild systemic disease. After reviewing the risks                            and benefits, the patient was deemed in                            satisfactory condition to undergo the procedure.                           After obtaining informed consent, the colonoscope  was passed under direct vision. Throughout the                            procedure, the patient's blood pressure, pulse, and                            oxygen saturations were monitored continuously. The                            Colonoscope was introduced through the anus and                            advanced to the the cecum, identified by                            appendiceal orifice  and ileocecal valve. The                            colonoscopy was performed without difficulty. The                            patient tolerated the procedure well. The quality                            of the bowel preparation was excellent. The                            ileocecal valve, appendiceal orifice, and rectum                            were photographed. Scope In: 2:19:07 PM Scope Out: 2:34:57 PM Scope Withdrawal Time: 0 hours 10 minutes 7 seconds  Total Procedure Duration: 0 hours 15 minutes 50 seconds  Findings:                 The perianal and digital rectal examinations were                            normal.                           A patchy area of mild melanosis was found in the                            entire colon.                           Non-bleeding internal hemorrhoids were found during                            retroflexion. The hemorrhoids were small.                           The exam was otherwise without abnormality. Complications:            No immediate complications. Estimated Blood Loss:  Estimated blood loss: none. Impression:               - Melanosis in the colon.                           - Non-bleeding internal hemorrhoids.                           - The examination was otherwise normal.                           - No specimens collected. Recommendation:           - Patient has a contact number available for                            emergencies. The signs and symptoms of potential                            delayed complications were discussed with the                            patient. Return to normal activities tomorrow.                            Written discharge instructions were provided to the                            patient.                           - Resume previous diet.                           - Continue present medications.                           - Repeat colonoscopy at age 80 for screening                             purposes. Mauri Pole, MD 04/01/2020 2:50:53 PM This report has been signed electronically.

## 2020-04-01 NOTE — Progress Notes (Signed)
Report given to PACU, vss 

## 2020-04-01 NOTE — Op Note (Signed)
[place] Patient Name: Rachel [name] Procedure Date: 04/01/2020 2:09 PM MRN: [id number] Endoscopist: Mauri Pole , MD Age: 34 Referring MD:  Date of Birth: 06-17-1986 Gender: Female Account #: [id number] Procedure:                Upper GI endoscopy Indications:              Generalized abdominal pain, Nausea, Weight loss Medicines:                Monitored Anesthesia Care Procedure:                Pre-Anesthesia Assessment:                           - Prior to the procedure, a History and Physical                            was performed, and patient medications and                            allergies were reviewed. The patient's tolerance of                            previous anesthesia was also reviewed. The risks                            and benefits of the procedure and the sedation                            options and risks were discussed with the patient.                            All questions were answered, and informed consent                            was obtained. Prior Anticoagulants: The patient has                            taken no previous anticoagulant or antiplatelet                            agents. ASA Grade Assessment: II - A patient with                            mild systemic disease. After reviewing the risks                            and benefits, the patient was deemed in                            satisfactory condition to undergo the procedure.                           After obtaining informed consent, the endoscope was  passed under direct vision. Throughout the                            procedure, the patient's blood pressure, pulse, and                            oxygen saturations were monitored continuously. The                            Endoscope was introduced through the mouth, and                            advanced to the second part of duodenum. The upper                             GI endoscopy was accomplished without difficulty.                            The patient tolerated the procedure well. Scope In: Scope Out: Findings:                 LA Grade A (one or more mucosal breaks less than 5                            mm, not extending between tops of 2 mucosal folds)                            esophagitis with no bleeding was found 33 to 36 cm                            from the incisors.                           Partial gastric volvulus was present, most of the                            stomach appears to have herniated up, large hiatal                            hernia with hiatal narrowing location in the                            gastric antrum. Otherwise gastric mucosa appeared                            normal.                           The examined duodenum was normal. Complications:            No immediate complications. Estimated Blood Loss:     Estimated blood loss: none. Impression:               - LA Grade A reflux esophagitis with no bleeding.                           -  Large hiatal hernia                           - Normal examined duodenum.                           - No specimens collected. Recommendation:           - Patient has a contact number available for                            emergencies. The signs and symptoms of potential                            delayed complications were discussed with the                            patient. Return to normal activities tomorrow.                            Written discharge instructions were provided to the                            patient.                           - Resume previous diet.                           - Continue present medications.                           - Follow an antireflux regimen.                           - CT abdomen and pelvis with contrast to be                            scheduled at next available appointment Mauri Pole, MD 04/01/2020 2:48:07  PM This report has been signed electronically.

## 2020-04-02 ENCOUNTER — Other Ambulatory Visit: Payer: Self-pay

## 2020-04-02 DIAGNOSIS — R110 Nausea: Secondary | ICD-10-CM

## 2020-04-02 DIAGNOSIS — R636 Underweight: Secondary | ICD-10-CM

## 2020-04-02 DIAGNOSIS — R1084 Generalized abdominal pain: Secondary | ICD-10-CM

## 2020-04-05 ENCOUNTER — Telehealth: Payer: Self-pay | Admitting: *Deleted

## 2020-04-05 ENCOUNTER — Ambulatory Visit: Payer: Medicaid Other | Admitting: Infectious Disease

## 2020-04-05 NOTE — Telephone Encounter (Signed)
First attempt, left VM.  

## 2020-04-05 NOTE — Telephone Encounter (Signed)
  Follow up Call-  Call back number 04/01/2020  Post procedure Call Back phone  # [phone]  Permission to leave phone message Yes  Some recent data might be hidden     Patient questions:  Do you have a fever, pain , or abdominal swelling? No. Pain Score  7 *  Have you tolerated food without any problems? Yes.    Have you been able to return to your normal activities? Yes.    Do you have any questions about your discharge instructions: Diet   No. Medications  No. Follow up visit  No.  Do you have questions or concerns about your Care? No.  Actions: * If pain score is 4 or above: No action needed, pain <4.  Pt. Stated " This pain is usual for me ",I alternate between 6-8 pain level,pt.scheduled for ct scan to help with dx,she stated that our office has been so good in helping her".   1. Have you developed a fever since your procedure? no  2.   Have you had an respiratory symptoms (SOB or cough) since your procedure? no  3.   Have you tested positive for COVID 19 since your procedure no  4.   Have you had any family members/close contacts diagnosed with the COVID 19 since your procedure?  no   If yes to any of these questions please route to Joylene John, RN and Erenest Rasher, RN

## 2020-04-06 ENCOUNTER — Ambulatory Visit (INDEPENDENT_AMBULATORY_CARE_PROVIDER_SITE_OTHER): Payer: Medicaid Other | Admitting: Infectious Disease

## 2020-04-06 ENCOUNTER — Encounter: Payer: Self-pay | Admitting: Infectious Disease

## 2020-04-06 ENCOUNTER — Other Ambulatory Visit: Payer: Self-pay

## 2020-04-06 VITALS — BP 114/79 | HR 71 | Temp 97.4°F | Wt 102.0 lb

## 2020-04-06 DIAGNOSIS — M791 Myalgia, unspecified site: Secondary | ICD-10-CM | POA: Diagnosis not present

## 2020-04-06 DIAGNOSIS — B89 Unspecified parasitic disease: Secondary | ICD-10-CM

## 2020-04-06 DIAGNOSIS — K30 Functional dyspepsia: Secondary | ICD-10-CM | POA: Diagnosis not present

## 2020-04-06 DIAGNOSIS — K449 Diaphragmatic hernia without obstruction or gangrene: Secondary | ICD-10-CM | POA: Diagnosis not present

## 2020-04-06 HISTORY — DX: Diaphragmatic hernia without obstruction or gangrene: K44.9

## 2020-04-06 HISTORY — DX: Myalgia, unspecified site: M79.10

## 2020-04-06 NOTE — Progress Notes (Signed)
Chief complaint:abdominal  pain  Subjective:    Patient ID: Rachel [name], female    DOB: February 27, 1986, 34 y.o.   MRN: [id number]  HPI  Rachel [name] is a 34 year old Caucasian female whose had a past medical history significant for difficulties with gaining weight as a teenager delayed gastric emptying some depression ADHD per the chart bipolar disorder.  She has had a number of difficult symptoms over the years including loss of 100 pounds of weight that is seeming to be involuntary.  She does endorse poor appetite and that multiple foods cause her to feel sick but she has been trying to eat as much as possible to put on weight and had great deal of difficulty doing so.  In May 2020 she had seen what appeared to be a large flat worm in her stool followed then by eggs.  She tried to engage with the medical community but was only able to do so remotely.  Ultimately she did come to the emergency department and was seen.  For some reason I do not understand a stool specimen was not obtained.  She says "they want to look at my urine instead of my stool."  She was given albendazole in the ER and apparently no longer saw any worms but did see some objects that look like eggs that her mother also observed.  Noted mother is also a Marine scientist who works here at [employer].  Patient apparently also was having some episodes of fever at the time.  She does not have any history of foreign travel she was not camping she has not been raising animals.  Her mom didremind her and she recounts the time of living with a boyfriend in a very unkept apartment where the boyfriend was hoarding and there was food rotting in newspapers stacked and not thrown away and garbage between chairs and cushions.  She does have 2 dogs at home who are otherwise healthy.  He is continue to have cramping abdominal pain and in particular right upper quadrant pain along with other symptoms including dysfunctional uterine bleeding.  She was  referred to [place] due to her history of having what was thought to be a parasitic infection though we do not have documentation of this through a formal oval ova and parasite or pathological exam, along with her continued cramping abdominal pain.  We did a host of labs which were unremarkable. I ordered stool O&P which she was not able to provide.  (she did so today)  She underwent EGD and coloscopy which were unremarkable though she says she saw "white flecks" on endoscopy.  Reports were normal. She was found to have large hiatal hernia which may explain a great deal of her symptoms.   Past Medical History:  Diagnosis Date  . ADHD (attention deficit hyperactivity disorder)   . Anemia   . Anxiety   . Asthma   . Bipolar disorder ([place])   . Delayed gastric emptying 02/23/2020  . Depression   . DUB (dysfunctional uterine bleeding) 02/23/2020  . GERD (gastroesophageal reflux disease)   . Parasitic infection 02/23/2020  . Poor appetite 02/23/2020  . RUQ pain 02/23/2020  . Vitamin D deficiency   . Weight loss 02/23/2020    Past Surgical History:  Procedure Laterality Date  . ESOPHAGOGASTRODUODENOSCOPY  2011   and a ph study as well.   [name] Kitchen NASAL SEPTUM SURGERY    . TONGUE FLAP    . WISDOM TOOTH EXTRACTION  Family History  Problem Relation Age of Onset  . Depression Mother   . Anxiety disorder Mother   . Hypertension Mother   . Hyperlipidemia Mother   . Asthma Mother   . Endometriosis Mother   . Fibroids Mother   . Depression Father   . Hypertension Father   . Hyperlipidemia Father   . Bipolar disorder Father   . Bipolar disorder Sister   . ADD / ADHD Sister   . Asthma Sister   . Endometriosis Sister   . Fibroids Sister   . Endometriosis Maternal Grandmother   . Fibroids Maternal Grandmother   . Colon cancer Maternal Grandmother       Social History   Socioeconomic History  . Marital status: Divorced    Spouse name: Not on file  . Number of children: Not on file  .  Years of education: Not on file  . Highest education level: Not on file  Occupational History  . Not on file  Tobacco Use  . Smoking status: Never Smoker  . Smokeless tobacco: Never Used  Substance and Sexual Activity  . Alcohol use: No  . Drug use: No  . Sexual activity: Not Currently  Other Topics Concern  . Not on file  Social History Narrative  . Not on file   Social Determinants of Health   Financial Resource Strain:   . Difficulty of Paying Living Expenses:   Food Insecurity:   . Worried About Charity fundraiser in the Last Year:   . Arboriculturist in the Last Year:   Transportation Needs:   . Film/video editor (Medical):   [name] Kitchen Lack of Transportation (Non-Medical):   Physical Activity:   . Days of Exercise per Week:   . Minutes of Exercise per Session:   Stress:   . Feeling of Stress :   Social Connections:   . Frequency of Communication with Friends and Family:   . Frequency of Social Gatherings with Friends and Family:   . Attends Religious Services:   . Active Member of Clubs or Organizations:   . Attends Archivist Meetings:   [name] Kitchen Marital Status:     Allergies  Allergen Reactions  . Amphetamine-Dextroamphetamine Other (See Comments)    Unknown reaction  . Penicillins Swelling    Severe allergy per allergy test Did it involve swelling of the face/tongue/throat, SOB, or low BP? Yes Did it involve sudden or severe rash/hives, skin peeling, or any reaction on the inside of your mouth or nose? Unknown Did you need to seek medical attention at a hospital or doctor's office? Unknown When did it last happen?teenager If all above answers are "NO", may proceed with cephalosporin use.  [name] Kitchen Doxycycline Itching, Nausea And Vomiting and Other (See Comments)    Caused high fever  . Olanzapine Other (See Comments)    Unknown reaction  . Other     Stimulants cause unknown reaction  . Prozac [Fluoxetine Hcl] Other (See Comments)    Unknown reaction       Current Outpatient Medications:  .  acetaminophen (TYLENOL) 500 MG tablet, Take 1,000 mg by mouth every 6 (six) hours as needed for headache (pain)., Disp: , Rfl:  .  albuterol (VENTOLIN HFA) 108 (90 Base) MCG/ACT inhaler, Inhale 1-2 puffs into the lungs every 6 (six) hours as needed for wheezing or shortness of breath., Disp: 8 g, Rfl: 0 .  cholecalciferol (VITAMIN D3) 25 MCG (1000 UNIT) tablet, Take 1,000 Units by mouth  daily., Disp: , Rfl:  .  fexofenadine (ALLEGRA) 180 MG tablet, Take 180 mg by mouth daily., Disp: , Rfl:  .  Multiple Vitamin (MULTIVITAMIN WITH MINERALS) TABS tablet, Take 1 tablet by mouth at bedtime., Disp: , Rfl:  .  naproxen sodium (ALEVE) 220 MG tablet, Take 220 mg by mouth 2 (two) times daily., Disp: , Rfl:  .  polycarbophil (FIBERCON) 625 MG tablet, Take 625 mg by mouth daily., Disp: , Rfl:  .  Vitamin D, Ergocalciferol, (DRISDOL) 1.25 MG (50000 UNIT) CAPS capsule, Take 1 capsule (50,000 Units total) by mouth every 7 (seven) days. One pill, once per week for 12 weeks, Disp: 12 capsule, Rfl: 0    Review of Systems  Constitutional: Positive for appetite change, fatigue and unexpected weight change. Negative for activity change, chills, diaphoresis and fever.  HENT: Negative for congestion, rhinorrhea, sinus pressure, sneezing, sore throat and trouble swallowing.   Eyes: Negative for photophobia and visual disturbance.  Respiratory: Negative for cough, chest tightness, shortness of breath, wheezing and stridor.   Cardiovascular: Negative for chest pain, palpitations and leg swelling.  Gastrointestinal: Positive for abdominal pain. Negative for anal bleeding, blood in stool, constipation, nausea and vomiting.  Genitourinary: Negative for difficulty urinating, dysuria, flank pain and hematuria.  Musculoskeletal: Positive for arthralgias. Negative for back pain, gait problem, joint swelling and myalgias.  Skin: Negative for color change, pallor, rash and wound.   Neurological: Negative for dizziness, tremors, weakness and light-headedness.  Hematological: Negative for adenopathy. Does not bruise/bleed easily.  Psychiatric/Behavioral: Negative for agitation, behavioral problems, confusion, decreased concentration, dysphoric mood and sleep disturbance.       Objective:   Physical Exam Constitutional:      General: She is not in acute distress.    Appearance: Normal appearance. She is well-developed. She is not ill-appearing or diaphoretic.  HENT:     Head: Normocephalic and atraumatic.     Right Ear: Hearing and external ear normal.     Left Ear: Hearing and external ear normal.     Nose: No nasal deformity or rhinorrhea.  Eyes:     General: No scleral icterus.    Conjunctiva/sclera: Conjunctivae normal.     Right eye: Right conjunctiva is not injected.     Left eye: Left conjunctiva is not injected.     Pupils: Pupils are equal, round, and reactive to light.  Neck:     Vascular: No JVD.  Cardiovascular:     Rate and Rhythm: Normal rate and regular rhythm.     Heart sounds: Normal heart sounds, S1 normal and S2 normal. No murmur. No friction rub.  Pulmonary:     Effort: Pulmonary effort is normal. No respiratory distress.  Abdominal:     General: There is no distension.     Palpations: Abdomen is soft.  Musculoskeletal:        General: Normal range of motion.     Right shoulder: Normal.     Left shoulder: Normal.     Cervical back: Normal range of motion and neck supple.     Right hip: Normal.     Left hip: Normal.     Right knee: Normal.     Left knee: Normal.  Lymphadenopathy:     Head:     Right side of head: No submandibular, preauricular or posterior auricular adenopathy.     Left side of head: No submandibular, preauricular or posterior auricular adenopathy.     Cervical: No cervical adenopathy.  Right cervical: No superficial or deep cervical adenopathy.    Left cervical: No superficial or deep cervical adenopathy.   Skin:    General: Skin is warm and dry.     Coloration: Skin is not pale.     Findings: No abrasion, bruising, ecchymosis, erythema, lesion or rash.     Nails: There is no clubbing.  Neurological:     General: No focal deficit present.     Mental Status: She is alert and oriented to person, place, and time.     Sensory: No sensory deficit.     Coordination: Coordination normal.     Gait: Gait normal.  Psychiatric:        Attention and Perception: She is attentive.        Mood and Affect: Mood normal.        Speech: Speech normal.        Behavior: Behavior normal. Behavior is cooperative.        Thought Content: Thought content normal.        Judgment: Judgment normal.           Assessment & [name]:   History of what was thought to be a parasitic infection:   She does not give a history epidemiologically to point to a particular parasitic infection but she has clear memory of what seemed to be warm to her and eggs that she and her mother both observed.  She has not observed any further worms or eggs since that time.  We will nonetheless have her repeat a stool ova and parasite exam.  Abdominal cramping and pain.  She is being worked up thoroughly by GI and now been found to have hiatal hernia   Ultrasound is scheduled as well.  Myalgias: being referred to Rheumoatology.  COVID vaccine need:  Fully  vaccinated

## 2020-04-08 ENCOUNTER — Telehealth: Payer: Self-pay | Admitting: Gastroenterology

## 2020-04-08 ENCOUNTER — Ambulatory Visit (HOSPITAL_COMMUNITY)
Admission: RE | Admit: 2020-04-08 | Discharge: 2020-04-08 | Disposition: A | Discharge status: Home / Self Care | Payer: Medicaid Other | Source: Ambulatory Visit | Attending: Gastroenterology | Admitting: Gastroenterology

## 2020-04-08 ENCOUNTER — Other Ambulatory Visit: Payer: Self-pay

## 2020-04-08 DIAGNOSIS — R1084 Generalized abdominal pain: Secondary | ICD-10-CM | POA: Diagnosis not present

## 2020-04-08 DIAGNOSIS — R636 Underweight: Secondary | ICD-10-CM | POA: Diagnosis not present

## 2020-04-08 DIAGNOSIS — R110 Nausea: Secondary | ICD-10-CM | POA: Insufficient documentation

## 2020-04-08 DIAGNOSIS — R109 Unspecified abdominal pain: Secondary | ICD-10-CM | POA: Diagnosis not present

## 2020-04-08 MED ORDER — IOHEXOL 300 MG/ML  SOLN
100.0000 mL | Freq: Once | INTRAMUSCULAR | Status: AC | PRN
  Administered 2020-04-08: 80 mL via INTRAVENOUS

## 2020-04-08 MED ORDER — SODIUM CHLORIDE (PF) 0.9 % IJ SOLN
INTRAMUSCULAR | Status: AC
  Filled 2020-04-08: qty 50

## 2020-04-09 ENCOUNTER — Telehealth: Payer: Self-pay

## 2020-04-09 NOTE — Telephone Encounter (Signed)
-----   Message from Mauri Pole, MD sent at 04/08/2020  4:30 PM EDT ----- Unfortunately the CT was negative for any pathology or hiatal hernia. We can schedule upper GI series to confirm that we are not missing any pathology.  We cannot treat her unless we figure out what is causing her problem and so far work-up is negative.  We can try to bring her in sooner with APP Thank you VN ----- Message ----- From: Yevette Edwards, RN Sent: 04/08/2020   4:15 PM EDT To: Mauri Pole, MD  Spoke with patient regarding CT scan results. Patient was audibly saddened because she doesn't know what to do, pt stated that she has been in so much pain, pt stated that she has not been able to get above 100 lbs, pt also stated that she can't enjoy foods or workout due to pain. Pt is sad and upset because the upper endoscopy showed different findings than the CT scan. Pt would like to know what she can do in the meantime (medications or treatment)  until her follow up appointment. Patient is frustrated due to having to deal with this for so long and not having a definitive answer. Pt advised that we will get this information to you and we will get back with her as soon as we hear something. Please advise.

## 2020-04-09 NOTE — Telephone Encounter (Signed)
Spoke with patient regarding recommendations, patient was agreeable to see Ellouise Newer - PA on 04/27/2020 at 2 pm to discuss further treatment options. Pt's mother was present for conversation, pt would like mother to come with her to her appointment to address many concerns. Pt advised to come to appointment with questions and concerns. Patient is still frustrated and in a lot of pain.

## 2020-04-09 NOTE — Telephone Encounter (Signed)
Spoke with the patient. Please see the CT imaging. We will monitor the schedule and bring her in sooner if an opening occurs.

## 2020-04-12 LAB — OVA AND PARASITE EXAMINATION
CONCENTRATE RESULT:: NONE SEEN
MICRO NUMBER:: 10517806
SPECIMEN QUALITY:: ADEQUATE
TRICHROME RESULT:: NONE SEEN

## 2020-04-13 NOTE — Telephone Encounter (Signed)
Spoke with the patient and offered an appointment 04/22/20 at 10:40am. She will check for transportation. Agrees to call us back today with her decision.

## 2020-04-21 NOTE — Telephone Encounter (Signed)
Pt reported that she is experiencing abd pain and difficulty breathing.  She would like to know what she can do in the meantime until her appt tomorrow 04/22/20.

## 2020-04-22 ENCOUNTER — Encounter: Payer: Self-pay | Admitting: Gastroenterology

## 2020-04-22 ENCOUNTER — Ambulatory Visit (INDEPENDENT_AMBULATORY_CARE_PROVIDER_SITE_OTHER): Payer: Medicaid Other | Admitting: Gastroenterology

## 2020-04-22 DIAGNOSIS — R110 Nausea: Secondary | ICD-10-CM

## 2020-04-22 DIAGNOSIS — R109 Unspecified abdominal pain: Secondary | ICD-10-CM | POA: Diagnosis not present

## 2020-04-22 DIAGNOSIS — R630 Anorexia: Secondary | ICD-10-CM

## 2020-04-22 NOTE — Progress Notes (Addendum)
Rachel [name]    [id number]    1985-11-25  Primary Care Physician:Fulp, Ander Gaster, MD  Referring Physician: Antony Blackbird, MD [place],  [place] [id number]   Chief complaint: Abdominal pain HPI:  34 year old  pleasant female with history of bipolar disorder, anxiety, depression, ADHD, chronic headaches here for follow-up visit for chronic abdominal pain, nausea and early satiety.  She is accompanied by her mother for this visit.  Mother is very knowledgeable, a Corporate treasurer at cardiovascular ICU,was in for the visit wearing her white lab coat.  Allie complains of severe generalized abdominal pain worse in epigastric and b/l lower abdomen.  Intermittently she notices swelling/ bubble in her left upper quadrant and epigastric region associated with severe abdominal pain.  On most days she is unable to get  out of bed, has no appetite.  Denies vomiting but has intermittent nausea.  She has alternating constipation and diarrhea, no rectal bleeding or blood in stool.  She is currently not on any laxatives.  She was seen by behavioral health providers from age [age] but has not seen anyone in the past 12-13 years.  She feels she has learnt many coping skills and does not want to waste money anymore at this point by seeing anybody.  Patient states that she is not "crazy", has expressed that she cannot continue to live like this and so much pain that she is unable to deal with".   Colonoscopy Apr 01, 2020: Mild melanosis, internal hemorrhoids otherwise normal exam  EGD May 20,2021 concerning for large hiatal hernia and partial gastric volvulus.  LA grade a esophagitis otherwise unremarkable exam  Subsequent CT abdomen pelvis with contrast on Apr 08, 2020 was negative for hiatal hernia or any pathology  Right upper quadrant abdominal ultrasound March 01, 2020: Normal  Transvaginal pelvic ultrasound Mar 17, 2020: Normal  In 2009 2-hour gastric emptying  study showed delayed emptying with 83% retention at 1 hour and 50% retained gastric contents at the end of 2 hours  Alternating constipation and diarrhea  She was treated for possible parasitic infection with albendazole in April 2021, saw Dr. Drucilla Schmidt in [place], follow-up labs unremarkable with a normal range.  Celiac studies, ANCA , ESR, CRP, anti-Saccharomyces Cerave Cl ab,  HIV, viral hepatitis studies.  Albumin 4.3, Prealbumin 25 within normal range. Thyroid studies WNL.   Outpatient Encounter Medications as of 04/22/2020  Medication Sig  . acetaminophen (TYLENOL) 500 MG tablet Take 1,000 mg by mouth every 6 (six) hours as needed for headache (pain).  [name] Kitchen albuterol (VENTOLIN HFA) 108 (90 Base) MCG/ACT inhaler Inhale 1-2 puffs into the lungs every 6 (six) hours as needed for wheezing or shortness of breath.  . cholecalciferol (VITAMIN D3) 25 MCG (1000 UNIT) tablet Take 1,000 Units by mouth daily.  . fexofenadine (ALLEGRA) 180 MG tablet Take 180 mg by mouth daily.  . Multiple Vitamin (MULTIVITAMIN WITH MINERALS) TABS tablet Take 1 tablet by mouth at bedtime.  . naproxen sodium (ALEVE) 220 MG tablet Take 220 mg by mouth 2 (two) times daily.  . polycarbophil (FIBERCON) 625 MG tablet Take 625 mg by mouth daily.  . Vitamin D, Ergocalciferol, (DRISDOL) 1.25 MG (50000 UNIT) CAPS capsule Take 1 capsule (50,000 Units total) by mouth every 7 (seven) days. One pill, once per week for 12 weeks   No facility-administered encounter medications on file as of 04/22/2020.    Allergies as of 04/22/2020 - Review Complete  04/08/2020  Allergen Reaction Noted  . Amphetamine-dextroamphetamine Other (See Comments) 03/28/2019  . Penicillins Swelling 10/02/2011  . Doxycycline Itching, Nausea And Vomiting, and Other (See Comments) 11/21/2017  . Olanzapine Other (See Comments) 10/02/2011  . Other  11/21/2017  . Prozac [fluoxetine hcl] Other (See Comments) 10/02/2011    Past Medical History:  Diagnosis Date  . ADHD  (attention deficit hyperactivity disorder)   . Anemia   . Anxiety   . Asthma   . Bipolar disorder ([place])   . Delayed gastric emptying 02/23/2020  . Depression   . DUB (dysfunctional uterine bleeding) 02/23/2020  . GERD (gastroesophageal reflux disease)   . Hiatal hernia 04/06/2020  . Myalgia 04/06/2020  . Parasitic infection 02/23/2020  . Poor appetite 02/23/2020  . RUQ pain 02/23/2020  . Vitamin D deficiency   . Weight loss 02/23/2020    Past Surgical History:  Procedure Laterality Date  . ESOPHAGOGASTRODUODENOSCOPY  2011   and a ph study as well.   [name] Kitchen NASAL SEPTUM SURGERY    . TONGUE FLAP    . WISDOM TOOTH EXTRACTION      Family History  Problem Relation Age of Onset  . Depression Mother   . Anxiety disorder Mother   . Hypertension Mother   . Hyperlipidemia Mother   . Asthma Mother   . Endometriosis Mother   . Fibroids Mother   . Depression Father   . Hypertension Father   . Hyperlipidemia Father   . Bipolar disorder Father   . Bipolar disorder Sister   . ADD / ADHD Sister   . Asthma Sister   . Endometriosis Sister   . Fibroids Sister   . Endometriosis Maternal Grandmother   . Fibroids Maternal Grandmother   . Colon cancer Maternal Grandmother     Social History   Socioeconomic History  . Marital status: Divorced    Spouse name: Not on file  . Number of children: Not on file  . Years of education: Not on file  . Highest education level: Not on file  Occupational History  . Not on file  Tobacco Use  . Smoking status: Never Smoker  . Smokeless tobacco: Never Used  Vaping Use  . Vaping Use: Never used  Substance and Sexual Activity  . Alcohol use: No  . Drug use: No  . Sexual activity: Not Currently  Other Topics Concern  . Not on file  Social History Narrative  . Not on file   Social Determinants of Health   Financial Resource Strain:   . Difficulty of Paying Living Expenses:   Food Insecurity:   . Worried About Charity fundraiser in the Last  Year:   . Arboriculturist in the Last Year:   Transportation Needs:   . Film/video editor (Medical):   [name] Kitchen Lack of Transportation (Non-Medical):   Physical Activity:   . Days of Exercise per Week:   . Minutes of Exercise per Session:   Stress:   . Feeling of Stress :   Social Connections:   . Frequency of Communication with Friends and Family:   . Frequency of Social Gatherings with Friends and Family:   . Attends Religious Services:   . Active Member of Clubs or Organizations:   . Attends Archivist Meetings:   [name] Kitchen Marital Status:   Intimate Partner Violence:   . Fear of Current or Ex-Partner:   . Emotionally Abused:   [name] Kitchen Physically Abused:   . Sexually Abused:  Review of systems:  All other review of systems negative except as mentioned in the HPI.   Physical Exam: Vitals:   04/22/20 1040  BP: 100/60  Pulse: 88   Body mass index is 18.18 kg/m. Gen:      No acute distress HEENT:   sclera anicteric Abd:       soft, non-tender; no palpable masses, no distension Ext:    No edema Neuro: alert and oriented x 3 Psych: Flat affect, tearful at times, did not make eye contact during conversation  Data Reviewed:  Reviewed labs, radiology imaging, old records and pertinent past GI work up   Assessment and Plan/Recommendations:  34 year old female with history of bipolar disorder, ADD/ADHD, depression, anxiety disorder with generalized abdominal pain, nausea, anorexia and weight loss  Unclear how much of her symptoms are secondary to functional/ behavioral health issues  Cannot exclude chronic intermittent gastric volvulus based on endoscopic findings.  No evidence of hiatal hernia on CT abdomen pelvis, no other abnormality was noted.  We will proceed with upper GI series with small bowel follow-through to better evaluate for gastric volvulus and also exclude any small bowel abnormality  If upper GI series unrevealing for any abnormality, plan for  4-hour gastric emptying study.  She did have delayed emptying on 2hr gastric emptying study many years ago  Alternating constipation and diarrhea likely secondary to irritable bowel syndrome Start Linzess 72 mcg daily, titrate up if needed to have 1-2 soft bowel movements daily  Continue with small frequent meals  Encouraged patient to establish with behavioral health provider for management of bipolar with depression and anxiety disorder, provided contact information for behavioral health clinic.  Advised patient to walk in for KUB if she notices swelling in the left upper quadrant associated with severe abdominal pain to identify if she has intermittent ?subacute gastric volvulus  This visit required >60 minutes of patient care (this includes precharting, chart review, review of results, face-to-face time used for counseling as well as treatment plan and follow-up. The patient was provided an opportunity to ask questions and all were answered. The patient agreed with the plan and demonstrated an understanding of the instructions.  Damaris Hippo , MD    CC: Antony Blackbird, MD

## 2020-04-22 NOTE — Patient Instructions (Addendum)
If you are age 33 or older, your body mass index should be between 23-30. Your Body mass index is 18.18 kg/m. If this is out of the aforementioned range listed, please consider follow up with your Primary Care Provider.  If you are age 33 or younger, your body mass index should be between 19-25. Your Body mass index is 18.18 kg/m. If this is out of the aformentioned range listed, please consider follow up with your Primary Care Provider.   START: Linzess 72 mcg take once daily. (samples)  Please remember to eat small frequent meals.  You have been scheduled for an Upper GI Series at [facility]. Your appointment is on 04-28-20 at 9:30. Please arrive 15 minutes prior to your test for registration. Make sure not to eat or drink anything after midnight on the night before your test. If you need to reschedule, please call radiology at [phone]. __________________________________________________________ An upper GI series uses x rays to help diagnose problems of the upper GI tract, which includes the esophagus, stomach, and duodenum. The duodenum is the first part of the small intestine. An upper GI series is conducted by a radiology technologist or a radiologist--a doctor who specializes in x-ray imaging--at a hospital or outpatient center. While sitting or standing in front of an x-ray machine, the patient drinks barium liquid, which is often white and has a chalky consistency and taste. The barium liquid coats the lining of the upper GI tract and makes signs of disease show up more clearly on x rays. X-ray video, called fluoroscopy, is used to view the barium liquid moving through the esophagus, stomach, and duodenum. Additional x rays and fluoroscopy are performed while the patient lies on an x-ray table. To fully coat the upper GI tract with barium liquid, the technologist or radiologist may press on the abdomen or ask the patient to change position. Patients hold still in  various positions, allowing the technologist or radiologist to take x rays of the upper GI tract at different angles. If a technologist conducts the upper GI series, a radiologist will later examine the images to look for problems.  This test typically takes about 1 hour to complete.  Due to recent changes in healthcare laws, you may see the results of your imaging and laboratory studies on MyChart before your provider has had a chance to review them.  We understand that in some cases there may be results that are confusing or concerning to you. Not all laboratory results come back in the same time frame and the provider may be waiting for multiple results in order to interpret others.  Please give [place] 48 hours in order for your provider to thoroughly review all the results before contacting the office for clarification of your results.   __________________________________________________________  If you have the bloating, swelling, or bubble feeling, please come to the basement level of our building between 8am-5pm for an Xray (KUB).  I appreciate the opportunity to care for you. Thank you for choosing me and [name],  Dr. Harl Bowie

## 2020-04-23 MED ORDER — LINACLOTIDE 72 MCG PO CAPS: 72.0000 ug | ORAL_CAPSULE | Freq: Every day | ORAL | 0 refills | Status: DC

## 2020-04-23 NOTE — Addendum Note (Signed)
Addended by: Stevan Born on: 04/23/2020 09:21 AM   Modules accepted: Orders

## 2020-04-26 ENCOUNTER — Telehealth: Payer: Self-pay | Admitting: Gastroenterology

## 2020-04-26 NOTE — Telephone Encounter (Signed)
Spoke with patient regarding recommendations, pt states that she didn't understand why she was on Linzess because she wasn't constipated before, pt advised to increase Linzess and try it for a few days to see if she has any relief, pt advised to call back with any concerns or if there is no improvement.

## 2020-04-26 NOTE — Telephone Encounter (Signed)
Linzess should not be causing constipation as it is meant to treat this condition I would recommend increasing the dose to 145 mcg daily.  She can see if this improves overall symptoms as discussed with Dr. Silverio Decamp last week. SBFT is pending

## 2020-04-26 NOTE — Telephone Encounter (Signed)
Pt is concerned that Linzess is causing constipation.

## 2020-04-26 NOTE — Telephone Encounter (Signed)
Dr. Hilarie Fredrickson as DOD please see note below and advise. Dr. Silverio Decamp pt.

## 2020-04-26 NOTE — Telephone Encounter (Signed)
Pt states she thinks the linzess 72 is causing her to be constipated, feels as though the linzess had "shut down her bowels." Pt wants to know what Dr. Silverio Decamp recommends. Please advise.

## 2020-04-27 ENCOUNTER — Ambulatory Visit: Payer: Medicaid Other | Admitting: Physician Assistant

## 2020-04-27 NOTE — Telephone Encounter (Signed)
I discussed at length the reason for starting Linzess during office visit. Agree with increasing the dose. Thanks

## 2020-04-28 ENCOUNTER — Ambulatory Visit (HOSPITAL_COMMUNITY)
Admission: RE | Admit: 2020-04-28 | Discharge: 2020-04-28 | Disposition: A | Discharge status: Home / Self Care | Payer: Medicaid Other | Source: Ambulatory Visit | Attending: Gastroenterology | Admitting: Gastroenterology

## 2020-04-28 ENCOUNTER — Other Ambulatory Visit: Payer: Self-pay

## 2020-04-28 DIAGNOSIS — R630 Anorexia: Secondary | ICD-10-CM | POA: Diagnosis not present

## 2020-04-28 DIAGNOSIS — R109 Unspecified abdominal pain: Secondary | ICD-10-CM | POA: Diagnosis not present

## 2020-04-28 DIAGNOSIS — R110 Nausea: Secondary | ICD-10-CM | POA: Diagnosis not present

## 2020-04-30 ENCOUNTER — Ambulatory Visit: Payer: Medicaid Other | Admitting: Family Medicine

## 2020-05-13 ENCOUNTER — Other Ambulatory Visit: Payer: Self-pay

## 2020-05-13 MED ORDER — LINACLOTIDE 145 MCG PO CAPS: ORAL_CAPSULE | ORAL | 1 refills | Status: DC

## 2020-05-13 NOTE — Telephone Encounter (Signed)
Patient and the mother are advised.

## 2020-05-13 NOTE — Telephone Encounter (Addendum)
Increasing Linzess has not helped with her constipation. Drinks prune juice everyday and has tried other OTC medication with no relief. Per pt. "has never been this constipated in her life"  Tells me she is down to 90 pounds in weight.

## 2020-05-13 NOTE — Telephone Encounter (Signed)
Spoke with the patient. She states she is passing very small amounts of very hard stool only for a week. She feels the urge to move her bowels, goes to the bathroom but has a hard time moving stool. Confirmed she is taking daily Linzess 124 mcg daily 30 minutes to an hour before her first meal of the day. She has added Equate Laxative (sennosides) for the past 3 days. States she is being conscientious in eating small frequent meals (says her family is watching her to be certain) and she is pushing PO water. She includes prune juice and hot tea.

## 2020-05-13 NOTE — Telephone Encounter (Signed)
Linzess should help improve the constipation not cause the constipation.  Okay to use senna as needed.  Can add MiraLAX 1 capful daily as needed, if persistent symptoms consider bowel purge with MiraLAX.  Follow-up in office visit as scheduled.  Thank you

## 2020-05-26 ENCOUNTER — Other Ambulatory Visit: Payer: Self-pay

## 2020-05-26 ENCOUNTER — Ambulatory Visit: Payer: Medicaid Other | Attending: Family Medicine | Admitting: Family Medicine

## 2020-05-26 ENCOUNTER — Encounter: Payer: Self-pay | Admitting: Family Medicine

## 2020-05-26 VITALS — BP 120/83 | HR 74 | Ht 62.0 in | Wt 101.8 lb

## 2020-05-26 DIAGNOSIS — R1084 Generalized abdominal pain: Secondary | ICD-10-CM

## 2020-05-26 DIAGNOSIS — K3184 Gastroparesis: Secondary | ICD-10-CM | POA: Diagnosis not present

## 2020-05-26 DIAGNOSIS — M791 Myalgia, unspecified site: Secondary | ICD-10-CM | POA: Diagnosis not present

## 2020-05-26 DIAGNOSIS — R636 Underweight: Secondary | ICD-10-CM

## 2020-05-26 MED ORDER — METOCLOPRAMIDE HCL 5 MG PO TABS: 5.0000 mg | ORAL_TABLET | Freq: Three times a day (TID) | ORAL | 1 refills | Status: DC

## 2020-05-26 NOTE — Progress Notes (Signed)
Subjective:  Patient ID: Rachel [name], female    DOB: 1986-06-13  Age: 34 y.o. MRN: [id number]  CC: Abdominal Pain   HPI [name] is a 34 year old female patient of Dr Chapman Fitch with a history of bipolar disorder, anxiety, depression, GERD, chronic abdominal pain seen today for an office visit.  1 month ago she had a visit with low-power GI for follow-up of her chronic abdominal pain. She states visiting GI has been frustating and GI made her wait a long time after teling her she needed surgery due to possible rotation of her bowels. GI notes from 04/22/2020 reviewed with previous work-up including colonoscopy, EGD.  Suspicion for intermittent gastric volvulus based on endoscopic findings, possible IBS started on Linzess and recommendation to establish with behavioral health. She has had no improvement, her weight. After eating she has an epigastric 'balloon' under her skin which does not last but pain gets intense . Standing for even15 mins causes her to feel dyspneic even though she does not look out of breath to observers but she feels out of breath. She has early satiety. Abdominal pain feels like a bruising,  She has elikminated a lot from her diet with so much weight loss associated. Has hard time eating anything that is not blended . Has nausea but is unable to vomit. Had diarrhea which alternates with constipation and now since starting Linzess has had constipation and is having to use MiraLAX.  Complains of not being able to move her bowels.  She also endorses presence of abdominal cramping Upper GI series with small bowel studies from 04/2020 revealed: IMPRESSION: 1. Subjectively delayed gastric emptying. 2. 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 3. Otherwise normal upper GI and small-bowel follow-through.  She is Bipolar and is not on treatment because she is worried about side effects of medications as she has seen people who suffer from  side effects from bipolar treatment.  She sometimes has muscle aches and at other times she feels a sense of intense pain when someone brushes against her slightly. Review of PCP notes indicates she had some autoimmune labs which were negative. Past Medical History:  Diagnosis Date  . ADHD (attention deficit hyperactivity disorder)   . Anemia   . Anxiety   . Asthma   . Bipolar disorder ([place])   . Delayed gastric emptying 02/23/2020  . Depression   . DUB (dysfunctional uterine bleeding) 02/23/2020  . GERD (gastroesophageal reflux disease)   . Hiatal hernia 04/06/2020  . Myalgia 04/06/2020  . Parasitic infection 02/23/2020  . Poor appetite 02/23/2020  . RUQ pain 02/23/2020  . Vitamin D deficiency   . Weight loss 02/23/2020    Past Surgical History:  Procedure Laterality Date  . ESOPHAGOGASTRODUODENOSCOPY  2011   and a ph study as well.   [name] Kitchen NASAL SEPTUM SURGERY    . TONGUE FLAP    . WISDOM TOOTH EXTRACTION      Family History  Problem Relation Age of Onset  . Depression Mother   . Anxiety disorder Mother   . Hypertension Mother   . Hyperlipidemia Mother   . Asthma Mother   . Endometriosis Mother   . Fibroids Mother   . Depression Father   . Hypertension Father   . Hyperlipidemia Father   . Bipolar disorder Father   . Bipolar disorder Sister   . ADD / ADHD Sister   . Asthma Sister   . Endometriosis Sister   . Fibroids Sister   .  Endometriosis Maternal Grandmother   . Fibroids Maternal Grandmother   . Colon cancer Maternal Grandmother     Allergies  Allergen Reactions  . Amphetamine-Dextroamphetamine Other (See Comments)    Unknown reaction  . Penicillins Swelling    Severe allergy per allergy test Did it involve swelling of the face/tongue/throat, SOB, or low BP? Yes Did it involve sudden or severe rash/hives, skin peeling, or any reaction on the inside of your mouth or nose? Unknown Did you need to seek medical attention at a hospital or doctor's office?  Unknown When did it last happen?teenager If all above answers are "NO", may proceed with cephalosporin use.  [name] Kitchen Doxycycline Itching, Nausea And Vomiting and Other (See Comments)    Caused high fever  . Olanzapine Other (See Comments)    Unknown reaction  . Other     Stimulants cause unknown reaction  . Prozac [Fluoxetine Hcl] Other (See Comments)    Unknown reaction    Outpatient Medications Prior to Visit  Medication Sig Dispense Refill  . acetaminophen (TYLENOL) 500 MG tablet Take 1,000 mg by mouth every 6 (six) hours as needed for headache (pain).    [name] Kitchen albuterol (VENTOLIN HFA) 108 (90 Base) MCG/ACT inhaler Inhale 1-2 puffs into the lungs every 6 (six) hours as needed for wheezing or shortness of breath. 8 g 0  . cholecalciferol (VITAMIN D3) 25 MCG (1000 UNIT) tablet Take 1,000 Units by mouth daily.    . fexofenadine (ALLEGRA) 180 MG tablet Take 180 mg by mouth daily.    [name] Kitchen linaclotide (LINZESS) 145 MCG CAPS capsule Take 1 tablet daily on empty stomach 30 minutes before first meal of the day 30 capsule 1  . Multiple Vitamin (MULTIVITAMIN WITH MINERALS) TABS tablet Take 1 tablet by mouth at bedtime.    . naproxen sodium (ALEVE) 220 MG tablet Take 220 mg by mouth 2 (two) times daily.    . polycarbophil (FIBERCON) 625 MG tablet Take 625 mg by mouth daily.    . Vitamin D, Ergocalciferol, (DRISDOL) 1.25 MG (50000 UNIT) CAPS capsule Take 1 capsule (50,000 Units total) by mouth every 7 (seven) days. One pill, once per week for 12 weeks 12 capsule 0   No facility-administered medications prior to visit.     ROS Review of Systems  Constitutional: Negative for activity change, appetite change and fatigue.  HENT: Negative for congestion, sinus pressure and sore throat.   Eyes: Negative for visual disturbance.  Respiratory: Negative for cough, chest tightness, shortness of breath and wheezing.   Cardiovascular: Negative for chest pain and palpitations.  Endocrine: Negative for  polydipsia.  Genitourinary: Negative for dysuria and frequency.  Musculoskeletal: Negative for arthralgias and back pain.  Skin: Negative for rash.  Neurological: Negative for tremors, light-headedness and numbness.  Hematological: Does not bruise/bleed easily.  Psychiatric/Behavioral: Negative for agitation and behavioral problems.    Objective:  BP 120/83   Pulse 74   Ht 5\' 2"  (1.575 m)   Wt 101 lb 12.8 oz (46.2 kg)   SpO2 100%   BMI 18.62 kg/m   BP/Weight 05/26/2020 04/22/2020 07/14/5175  Systolic BP 160 737 106  Diastolic BP 83 60 79  Wt. (Lbs) 101.8 101 102  BMI 18.62 18.18 18.66  Some encounter information is confidential and restricted. Go to Review Flowsheets activity to see all data.      Physical Exam Constitutional:      Appearance: She is underweight.  Neck:     Vascular: No JVD.  Cardiovascular:  Rate and Rhythm: Normal rate.     Heart sounds: Normal heart sounds. No murmur heard.   Pulmonary:     Effort: Pulmonary effort is normal.     Breath sounds: Normal breath sounds. No wheezing or rales.  Chest:     Chest wall: No tenderness.  Abdominal:     General: Abdomen is scaphoid. Bowel sounds are normal. There is no distension.     Palpations: Abdomen is soft. There is no mass.     Tenderness: There is generalized abdominal tenderness. Negative signs include Murphy's sign.  Musculoskeletal:        General: Normal range of motion.     Right lower leg: No edema.     Left lower leg: No edema.  Neurological:     Mental Status: She is alert and oriented to person, place, and time.  Psychiatric:        Mood and Affect: Mood normal.     CMP Latest Ref Rng & Units 02/04/2020 01/16/2020 03/28/2019  Glucose 65 - 99 mg/dL 77 91 95  BUN 6 - 20 mg/dL 14 9 9   Creatinine 0.57 - 1.00 mg/dL 0.72 0.66 0.62  Sodium 134 - 144 mmol/L 142 140 142  Potassium 3.5 - 5.2 mmol/L 4.5 3.7 4.0  Chloride 96 - 106 mmol/L 107(H) 107 108  CO2 20 - 29 mmol/L 24 25 27   Calcium 8.7  - 10.2 mg/dL 9.5 9.7 9.6  Total Protein 6.5 - 8.1 g/dL - 6.6 6.8  Total Bilirubin 0.3 - 1.2 mg/dL - 0.7 0.6  Alkaline Phos 38 - 126 U/L - 36(L) 42  AST 15 - 41 U/L - 16 14(L)  ALT 0 - 44 U/L - 12 9    Lipid Panel  No results found for: CHOL, TRIG, HDL, CHOLHDL, VLDL, LDLCALC, LDLDIRECT  CBC    Component Value Date/Time   WBC 7.5 01/16/2020 1523   RBC 4.09 01/16/2020 1523   HGB 12.8 01/16/2020 1523   HCT 37.7 01/16/2020 1523   PLT 228 01/16/2020 1523   MCV 92.2 01/16/2020 1523   MCH 31.3 01/16/2020 1523   MCHC 34.0 01/16/2020 1523   RDW 12.4 01/16/2020 1523   LYMPHSABS 2.7 01/16/2020 1523   MONOABS 0.5 01/16/2020 1523   EOSABS 0.1 01/16/2020 1523   BASOSABS 0.0 01/16/2020 1523    No results found for: HGBA1C  Assessment & [name]:  1. Generalized abdominal pain Suspicion for IBS with alternating constipation and diarrhea per GI notes, ? volvulus Unfortunately Linzess has caused her to be more constipated and she is using MiraLAX at this time Upper GI series revealed delayed gastric emptying Discussed increasing fiber intake and other foods that will prevent constipation.  2. Underweight Secondary to anorexia and limited oral intake  3. Myalgia Advised that this will need to be worked up at a subsequent visit  4. Gastroparesis Delayed gastric emptying evident from previous upper GI series We have discussed initiation of Reglan which she is skeptical about due to side effect and concerns for 'TICS' We have discussed the pros and cons of Reglan and through joint decision-making including the patient and her mother she is agreeable to a trial of Reglan and will notify me if she develops any adverse effects. Advised to eat small frequent portions - metoCLOPramide (REGLAN) 5 MG tablet; Take 1 tablet (5 mg total) by mouth 3 (three) times daily before meals.  Dispense: 90 tablet; Refill: 1    Meds ordered this encounter  Medications  .  metoCLOPramide (REGLAN) 5 MG tablet     Sig: Take 1 tablet (5 mg total) by mouth 3 (three) times daily before meals.    Dispense:  90 tablet    Refill:  1    Follow-up: Return in about 1 month (around 06/26/2020) for Follow-up on abdominal pain and myalgias.       Charlott Rakes, MD, FAAFP. [facility], [place]   05/26/2020, 10:26 AM

## 2020-05-26 NOTE — Progress Notes (Signed)
Has been to several specialist and states that she is still having the abdominal pain.  States that she is having overall body pain.

## 2020-05-26 NOTE — Patient Instructions (Signed)
Gastroparesis  Gastroparesis is a condition in which food takes longer than normal to empty from the stomach. The condition is usually long-lasting (chronic). It may also be called delayed gastric emptying. There is no cure, but there are treatments and things that you can do at home to help relieve symptoms. Treating the underlying condition that causes gastroparesis can also help relieve symptoms. What are the causes? In many cases, the cause of this condition is not known. Possible causes include:  A hormone (endocrine) disorder, such as hypothyroidism or diabetes.  A nervous system disease, such as Parkinson's disease or multiple sclerosis.  Cancer, infection, or surgery that affects the stomach or vagus nerve. The vagus nerve runs from your chest, through your neck, to the lower part of your brain.  A connective tissue disorder, such as scleroderma.  Certain medicines. What increases the risk? You are more likely to develop this condition if you:  Have certain disorders or diseases, including: ? An endocrine disorder. ? An eating disorder. ? Amyloidosis. ? Scleroderma. ? Parkinson's disease. ? Multiple sclerosis. ? Cancer or infection of the stomach or the vagus nerve.  Have had surgery on the stomach or vagus nerve.  Take certain medicines.  Are female. What are the signs or symptoms? Symptoms of this condition include:  Feeling full after eating very little.  Nausea.  Vomiting.  Heartburn.  Abdominal bloating.  Inconsistent blood sugar (glucose) levels on blood tests.  Lack of appetite.  Weight loss.  Acid from the stomach coming up into the esophagus (gastroesophageal reflux).  Sudden tightening (spasm) of the stomach, which can be painful. Symptoms may come and go. Some people may not notice any symptoms. How is this diagnosed? This condition is diagnosed with tests, such as:  Tests that check how long it takes food to move through the stomach and  intestines. These tests include: ? Upper gastrointestinal (GI) series. For this test, you drink a liquid that shows up well on X-rays, and then X-rays will be taken of your intestines. ? Gastric emptying scintigraphy. For this test, you eat food that contains a small amount of radioactive material, and then scans are taken. ? Wireless capsule GI monitoring system. For this test, you swallow a pill (capsule) that records information about how foods and fluid move through your stomach.  Gastric manometry. For this test, a tube is passed down your throat and into your stomach to measure electrical and muscular activity.  Endoscopy. For this test, a long, thin tube is passed down your throat and into your stomach to check for problems in your stomach lining.  Ultrasound. This test uses sound waves to create images of inside the body. This can help rule out gallbladder disease or pancreatitis as a cause of your symptoms. How is this treated? There is no cure for gastroparesis. Treatment may include:  Treating the underlying cause.  Managing your symptoms by making changes to your diet and exercise habits.  Taking medicines to control nausea and vomiting and to stimulate stomach muscles.  Getting food through a feeding tube in the hospital. This may be done in severe cases.  Having surgery to insert a device into your body that helps improve stomach emptying and control nausea and vomiting (gastric neurostimulator). Follow these instructions at home:  Take over-the-counter and prescription medicines only as told by your health care provider.  Follow instructions from your health care provider about eating or drinking restrictions. Your health care provider may recommend that you: ? Eat   smaller meals more often. ? Eat low-fat foods. ? Eat low-fiber forms of high-fiber foods. For example, eat cooked vegetables instead of raw vegetables. ? Have only liquid foods instead of solid foods. Liquid  foods are easier to digest.  Drink enough fluid to keep your urine pale yellow.  Exercise as often as told by your health care provider.  Keep all follow-up visits as told by your health care provider. This is important. Contact a health care provider if you:  Notice that your symptoms do not improve with treatment.  Have new symptoms. Get help right away if you:  Have severe abdominal pain that does not improve with treatment.  Have nausea that is severe or does not go away.  Cannot drink fluids without vomiting. Summary  Gastroparesis is a chronic condition in which food takes longer than normal to empty from the stomach.  Symptoms include nausea, vomiting, heartburn, abdominal bloating, and loss of appetite.  Eating smaller portions, and low-fat, low-fiber foods may help you manage your symptoms.  Get help right away if you have severe abdominal pain. This information is not intended to replace advice given to you by your health care provider. Make sure you discuss any questions you have with your health care provider. Document Revised: 01/28/2018 Document Reviewed: 09/04/2017 Elsevier Patient Education  2020 [employer].  

## 2020-05-28 ENCOUNTER — Encounter: Payer: Self-pay | Admitting: Family Medicine

## 2020-05-28 ENCOUNTER — Ambulatory Visit: Payer: Self-pay

## 2020-05-28 NOTE — Telephone Encounter (Signed)
Patient called and says she thinks she's experiencing side effects from Reglan. She says she took her first dose around 8am this morning because the pharmacy was delayed in filling it. She says she's having muscle twitching to her neck, shoulders, back, abdominal muscles, shaky hands and her neck is stiff. She says she feels hyper yet exhausted as well. She says she wants to give the medication a fair chance and will do whatever the provider wants her to do for the gastroparesis. She wants to know is this something normal she should be experiencing on this medication or should she stop taking it. I advised I will send this information to the provider and someone will call back with the recommendation.   Reason for Disposition . [1] Caller has URGENT medicine question about med that PCP or specialist prescribed AND [2] triager unable to answer question  Answer Assessment - Initial Assessment Questions 1. NAME of MEDICATION: "What medicine are you calling about?"     Reglan 2. QUESTION: "What is your question?" (e.g., medication refill, side effect)     I'm having twitching of muscles  3. PRESCRIBING HCP: "Who prescribed it?" Reason: if prescribed by specialist, call should be referred to that group.     Last OV 4. SYMPTOMS: "Do you have any symptoms?"     Yes 5. SEVERITY: If symptoms are present, ask "Are they mild, moderate or severe?"     Mild 6. PREGNANCY:  "Is there any chance that you are pregnant?" "When was your last menstrual period?"     No  Protocols used: MEDICATION QUESTION CALL-A-AH

## 2020-05-28 NOTE — Telephone Encounter (Signed)
I gave the patient a call myself and she confirmed she feels hypermanic after taking a single dose of Reglan less than 3 hours after.  She feels restless, has muscle twitching.  I have advised it is surprising she has symptoms that fast. Advised her not to take any additional doses and record her symptoms in a symptom diary. A week from now she has been advised to try her Reglan again when there is someone besides her at home and if symptoms recur we will need to discontinue Reglan altogether.  At her last visit with me yesterday she had stated adamantly she did not want to be on medication for bipolar disorder because she was concerned about possible side effects.  She informs me she is willing to try medications to improve her abdominal symptoms and gastroparesis.

## 2020-05-31 ENCOUNTER — Telehealth: Payer: Self-pay | Admitting: Family Medicine

## 2020-05-31 DIAGNOSIS — F319 Bipolar disorder, unspecified: Secondary | ICD-10-CM

## 2020-05-31 NOTE — Telephone Encounter (Signed)
Please f/u  Copied from [place] [phone]. Topic: General - Other >> May 31, 2020  9:57 AM Rainey Pines A wrote: Patients mother would like a  callback from Dr. Margarita Rana in regards to some concerns in regards to patients last appointment. Patients mother feels that patient may have left out some details to PCP. Best contact [phone]

## 2020-06-03 NOTE — Telephone Encounter (Signed)
I spoke with mom on the phone who explained that patient became manic after taking a dose of Reglan.  Advised to hold off on taking second dose tomorrow.  I have discussed the importance of seeing a psychiatrist for management of her bipolar disorder as she is currently not on treatment and mom is agreeable and promises to inform the patient.  Mom has given me permission to proceed with placing a referral to psychiatry.

## 2020-06-03 NOTE — Telephone Encounter (Signed)
Pt called stating that taking metoCLOPramide (REGLAN) 5 MG tablet  Has triggered her Bi-polar symptoms and she hasnt felt normal since taking it and wants provider to call her mom(Pat) @[phone] to discuss what they have noticed since pt has taken Reglan. Pt stated the physical symptoms are gone but her mental symptoms(anxiety and restlessness) has not stopped/ please call and advise/    Pt was advised to stop medication and start again tomorrow but pt is afraid of being hospitalized for being manic if she does so/ Pt wants to know if she should hold off/

## 2020-06-03 NOTE — Telephone Encounter (Signed)
Will route to PCP for review. 

## 2020-06-04 ENCOUNTER — Encounter (INDEPENDENT_AMBULATORY_CARE_PROVIDER_SITE_OTHER): Payer: Self-pay

## 2020-06-09 ENCOUNTER — Telehealth: Payer: Self-pay | Admitting: Family Medicine

## 2020-06-09 NOTE — Telephone Encounter (Signed)
Please advise.  Copied from [place] [phone]. Topic: General - Other >> Jun 08, 2020 10:29 AM Yvette Rack wrote: Reason for CRM: Pt stated she has ana appt with [facility] but after research she would prefer a referral to another location. Pt stated she and her mother read the reviews and there are lots of issues with how patients are treated as well as billing issues with Medicaid. Pt requests call back. Cb# [phone]

## 2020-06-15 NOTE — Telephone Encounter (Signed)
Sent a new referral to  [facility]  [address] [place], [place] [address] Phone [phone]; Fax [phone]

## 2020-06-23 ENCOUNTER — Ambulatory Visit (INDEPENDENT_AMBULATORY_CARE_PROVIDER_SITE_OTHER): Payer: Medicaid Other | Admitting: Gastroenterology

## 2020-06-23 ENCOUNTER — Encounter: Payer: Self-pay | Admitting: Gastroenterology

## 2020-06-23 VITALS — BP 90/60 | HR 93 | Ht 62.0 in | Wt 99.4 lb

## 2020-06-23 DIAGNOSIS — K581 Irritable bowel syndrome with constipation: Secondary | ICD-10-CM | POA: Diagnosis not present

## 2020-06-23 DIAGNOSIS — R634 Abnormal weight loss: Secondary | ICD-10-CM | POA: Diagnosis not present

## 2020-06-23 DIAGNOSIS — R1013 Epigastric pain: Secondary | ICD-10-CM

## 2020-06-23 DIAGNOSIS — K599 Functional intestinal disorder, unspecified: Secondary | ICD-10-CM

## 2020-06-23 DIAGNOSIS — R110 Nausea: Secondary | ICD-10-CM | POA: Diagnosis not present

## 2020-06-23 DIAGNOSIS — K30 Functional dyspepsia: Secondary | ICD-10-CM | POA: Diagnosis not present

## 2020-06-23 MED ORDER — LINACLOTIDE 290 MCG PO CAPS: 290.0000 ug | ORAL_CAPSULE | Freq: Every day | ORAL | 3 refills | Status: DC

## 2020-06-23 NOTE — Patient Instructions (Addendum)
We will refer you to Dr Milderd Meager at [facility], they will contact you with that appointment  You have been scheduled for a test called Smart Pill.  This is a test that evaluates the motility of your GI tract.  You will eat a SmartBar meal bar then swallow a SmartPill. After ingesting the [place] you will be asked to fast again for 6 hours.  You will wear a recorder that receives data from the capsule for up to 5 days.  You will need to wear the recorder at all times for the duration of the test except while bathing or sleeping.  Once you pass the capsule you will return the recorder box to [facility].   Please arrive at [facility] #rd floor at ______ am  on ______.  Do not have anything to eat or drink after midnight the morning of your test. You will be provided specific instructions at [facility] regarding activity and dietary restrictions for the remainder of the procedure.    You will need to hold the following medications prior to your procedure day and for the duration of your test:  ________        7 days prior hold Nexium, Prilosec, Prevacid, Dexilant, Omeprazole, Aciphex,                  Protonix, or Pantoprazole. ________      3 days prior hold Axid, Pepcid, Zantac, or Ranitidine ________        3 days prior hold Reglan, Metoclopramide, or Domperidone ________       3 days prior hold Dicyclomine, Hyoscyamine, Bentyl, Robinul, Glycopyrrolate,                  Hyomax ________       3 days prior hold any antiemetics Phenergan, Promethazine, Zofran,                                Ondansetron. ______       3 days prior hold all antidiarrheal agents ______        3 days prior hold all narcotic pain medications and NSAIDS ______      2 days prior hold all laxatives ______        1 day prior hold all Antacids: TUMS, Gaviscon, Rolaids    Increase Linzess to 290 mcg daily  Continue with high calorie and high protein diet      High-Protein and High-Calorie Diet Eating high-protein and high-calorie foods can help you to gain weight, heal after an injury, and recover after an illness or surgery. The specific amount of daily protein and calories you need depends on:  Your body weight.  The reason this diet is recommended for you. What is my plan? Generally, a high-protein, high-calorie diet involves:  Eating 250-500 extra calories each day.  Making sure that you get enough of your daily calories from protein. Ask your health care provider how many of your calories should come from protein. Talk with a health care provider, such as a diet and nutrition specialist (dietitian), about how much protein and how many calories you need each day. Follow the diet as directed by your health care provider. What are tips for following this plan?  Preparing meals  Add whole milk, half-and-half, or heavy cream to cereal, pudding, soup, or hot cocoa.  Add whole milk to instant breakfast drinks.  Add peanut butter to oatmeal or smoothies.  Add powdered milk to baked goods, smoothies, or milkshakes.  Add powdered milk, cream, or butter to mashed potatoes.  Add cheese to cooked vegetables.  Make whole-milk yogurt parfaits. Top them with granola, fruit, or nuts.  Add cottage cheese to your fruit.  Add avocado, cheese, or both to sandwiches or salads.  Add meat, poultry, or seafood to rice, pasta, casseroles, salads, and soups.  Use mayonnaise when making egg salad, chicken salad, or tuna salad.  Use peanut butter as a dip for vegetables or as a topping for pretzels, celery, or crackers.  Add beans to casseroles, dips, and spreads.  Add pureed beans to sauces and soups.  Replace calorie-free drinks with calorie-containing drinks, such as milk and fruit juice.  Replace water with milk or heavy cream when making foods such as oatmeal, pudding, or cocoa. General instructions  Ask your health care provider if  you should take a nutritional supplement.  Try to eat six small meals each day instead of three large meals.  Eat a balanced diet. In each meal, include one food that is high in protein.  Keep nutritious snacks available, such as nuts, trail mixes, dried fruit, and yogurt.  If you have kidney disease or diabetes, talk with your health care provider about how much protein is safe for you. Too much protein may put extra stress on your kidneys.  Drink your calories. Choose high-calorie drinks and have them after your meals. What high-protein foods should I eat?  Vegetables Soybeans. Peas. Grains Quinoa. Bulgur wheat. Meats and other proteins Beef, pork, and poultry. Fish and seafood. Eggs. Tofu. Textured vegetable protein (TVP). Peanut butter. Nuts and seeds. Dried beans. Protein powders. Dairy Whole milk. Whole-milk yogurt. Powdered milk. Cheese. Yahoo. Eggnog. Beverages High-protein supplement drinks. Soy milk. Other foods Protein bars. The items listed above may not be a complete list of high-protein foods and beverages. Contact a dietitian for more options. What high-calorie foods should I eat? Fruits Dried fruit. Fruit leather. Canned fruit in syrup. Fruit juice. Avocado. Vegetables Vegetables cooked in oil or butter. Fried potatoes. Grains Pasta. Quick breads. Muffins. Pancakes. Ready-to-eat cereal. Meats and other proteins Peanut butter. Nuts and seeds. Dairy Heavy cream. Whipped cream. Cream cheese. Sour cream. Ice cream. Custard. Pudding. Beverages Meal-replacement beverages. Nutrition shakes. Fruit juice. Sugar-sweetened soft drinks. Seasonings and condiments Salad dressing. Mayonnaise. Alfredo sauce. Fruit preserves or jelly. Honey. Syrup. Sweets and desserts Cake. Cookies. Pie. Pastries. Candy bars. Chocolate. Fats and oils Butter or margarine. Oil. Gravy. Other foods Meal-replacement bars. The items listed above may not be a complete list of  high-calorie foods and beverages. Contact a dietitian for more options. Summary  A high-protein, high-calorie diet can help you gain weight or heal faster after an injury, illness, or surgery.  To increase your protein and calories, add ingredients such as whole milk, peanut butter, cheese, beans, meat, or seafood to meal items.  To get enough extra calories each day, include high-calorie foods and beverages at each meal.  Adding a high-calorie drink or shake can be an easy way to help you get enough calories each day. Talk with your healthcare provider or dietitian about the best options for you. This information is not intended to replace advice given to you by your health care provider. Make sure you discuss any questions you have with your health care provider. Document Revised: 10/12/2017 Document Reviewed: 09/11/2017 Elsevier Patient Education  [place]  I appreciate the  opportunity to care for you  Thank You   Harl Bowie , MD

## 2020-06-23 NOTE — Progress Notes (Signed)
Rachel [name]    [id number]    07-29-1986  Primary Care Physician:Fulp, Ander Gaster, MD, [place]   Referring Physician: Antony Blackbird, MD [place],  [place] [address]   Chief complaint: Abdominal pain, nausea, weight loss  HPI: 34 year old very pleasant female here for follow-up visit accompanied by her mother She continues to have severe nausea.  C/o loosing weight Reglan triggered bipolar symptoms, had a locked-in neck.  She has also significant side effects from other medications that she was taking previously for bipolar disorder  She is having bowel movements on most days with Linzess 145 mcg daily and MiraLAX 1-1/2 capful daily.  She has not been able to come in for x-ray, whenever she notices the bubble or swelling in mid abdomen, it resolves but the abdominal pain continues.  She has been mostly doing liquid diet, is afraid to eat anything solids as she feels solid diet triggers her symptoms.  GI series with small bowel follow-through April 28, 2020: No hiatal hernia, no significant gastroesophageal reflux.  Gastric emptying was subjectively delayed.  Delayed small bowel transit.  No fixed filling defects or obstruction.  EGD May 20,2021 concerning for large hiatal hernia and partial gastric volvulus.  LA grade a esophagitis otherwise unremarkable exam  Subsequent CT abdomen pelvis with contrast on Apr 08, 2020 was negative for hiatal hernia or any pathology  Colonoscopy Apr 01, 2020: Mild melanosis, internal hemorrhoids otherwise normal exam  Right upper quadrant abdominal ultrasound March 01, 2020: Normal  Transvaginal pelvic ultrasound Mar 17, 2020: Normal  In 2009 2-hour gastric emptying study showed delayed emptying with 83% retention at 1 hour and 50% retained gastric contents at the end of 2 hours  She was treated for possible parasitic infection with albendazole in April 2021, saw Dr. Drucilla Schmidt in [place], follow-up labs were unremarkable  with in normal range.  Celiac studies, ANCA , ESR, CRP, anti-Saccharomyces Cerave Cl ab, HIV, viral hepatitis studies.  Albumin 4.3,Prealbumin 25 within normal range.Thyroid studies WNL.  Outpatient Encounter Medications as of 06/23/2020  Medication Sig  . acetaminophen (TYLENOL) 500 MG tablet Take 1,000 mg by mouth every 6 (six) hours as needed for headache (pain).  [name] Kitchen albuterol (VENTOLIN HFA) 108 (90 Base) MCG/ACT inhaler Inhale 1-2 puffs into the lungs every 6 (six) hours as needed for wheezing or shortness of breath.  . cholecalciferol (VITAMIN D3) 25 MCG (1000 UNIT) tablet Take 1,000 Units by mouth daily.  . fexofenadine (ALLEGRA) 180 MG tablet Take 180 mg by mouth daily.  [name] Kitchen linaclotide (LINZESS) 145 MCG CAPS capsule Take 1 tablet daily on empty stomach 30 minutes before first meal of the day  . metoCLOPramide (REGLAN) 5 MG tablet Take 1 tablet (5 mg total) by mouth 3 (three) times daily before meals.  . Multiple Vitamin (MULTIVITAMIN WITH MINERALS) TABS tablet Take 1 tablet by mouth at bedtime.  . naproxen sodium (ALEVE) 220 MG tablet Take 220 mg by mouth 2 (two) times daily.  . polycarbophil (FIBERCON) 625 MG tablet Take 625 mg by mouth daily.  . Vitamin D, Ergocalciferol, (DRISDOL) 1.25 MG (50000 UNIT) CAPS capsule Take 1 capsule (50,000 Units total) by mouth every 7 (seven) days. One pill, once per week for 12 weeks   No facility-administered encounter medications on file as of 06/23/2020.    Allergies as of 06/23/2020 - Review Complete 05/28/2020  Allergen Reaction Noted  . Amphetamine-dextroamphetamine Other (See Comments) 03/28/2019  . Penicillins Swelling 10/02/2011  .  Doxycycline Itching, Nausea And Vomiting, and Other (See Comments) 11/21/2017  . Olanzapine Other (See Comments) 10/02/2011  . Other  11/21/2017  . Prozac [fluoxetine hcl] Other (See Comments) 10/02/2011    Past Medical History:  Diagnosis Date  . ADHD (attention deficit hyperactivity disorder)   . Anemia    . Anxiety   . Asthma   . Bipolar disorder ([place])   . Delayed gastric emptying 02/23/2020  . Depression   . DUB (dysfunctional uterine bleeding) 02/23/2020  . GERD (gastroesophageal reflux disease)   . Hiatal hernia 04/06/2020  . Myalgia 04/06/2020  . Parasitic infection 02/23/2020  . Poor appetite 02/23/2020  . RUQ pain 02/23/2020  . Vitamin D deficiency   . Weight loss 02/23/2020    Past Surgical History:  Procedure Laterality Date  . ESOPHAGOGASTRODUODENOSCOPY  2011   and a ph study as well.   [name] Kitchen NASAL SEPTUM SURGERY    . TONGUE FLAP    . WISDOM TOOTH EXTRACTION      Family History  Problem Relation Age of Onset  . Depression Mother   . Anxiety disorder Mother   . Hypertension Mother   . Hyperlipidemia Mother   . Asthma Mother   . Endometriosis Mother   . Fibroids Mother   . Depression Father   . Hypertension Father   . Hyperlipidemia Father   . Bipolar disorder Father   . Bipolar disorder Sister   . ADD / ADHD Sister   . Asthma Sister   . Endometriosis Sister   . Fibroids Sister   . Endometriosis Maternal Grandmother   . Fibroids Maternal Grandmother   . Colon cancer Maternal Grandmother     Social History   Socioeconomic History  . Marital status: Divorced    Spouse name: Not on file  . Number of children: Not on file  . Years of education: Not on file  . Highest education level: Not on file  Occupational History  . Not on file  Tobacco Use  . Smoking status: Never Smoker  . Smokeless tobacco: Never Used  Vaping Use  . Vaping Use: Never used  Substance and Sexual Activity  . Alcohol use: No  . Drug use: No  . Sexual activity: Not Currently  Other Topics Concern  . Not on file  Social History Narrative  . Not on file   Social Determinants of Health   Financial Resource Strain:   . Difficulty of Paying Living Expenses:   Food Insecurity:   . Worried About Charity fundraiser in the Last Year:   . Arboriculturist in the Last Year:     Transportation Needs:   . Film/video editor (Medical):   [name] Kitchen Lack of Transportation (Non-Medical):   Physical Activity:   . Days of Exercise per Week:   . Minutes of Exercise per Session:   Stress:   . Feeling of Stress :   Social Connections:   . Frequency of Communication with Friends and Family:   . Frequency of Social Gatherings with Friends and Family:   . Attends Religious Services:   . Active Member of Clubs or Organizations:   . Attends Archivist Meetings:   [name] Kitchen Marital Status:   Intimate Partner Violence:   . Fear of Current or Ex-Partner:   . Emotionally Abused:   [name] Kitchen Physically Abused:   . Sexually Abused:       Review of systems: All other review of systems negative except as mentioned in the  HPI.   Physical Exam: Vitals:   06/23/20 0830  BP: 90/60  Pulse: 93  SpO2: 99%   Body mass index is 18.18 kg/m. Gen:      No acute distress HEENT:  sclera anicteric Abd:      soft, non-tender; no palpable masses, no distension Ext:    No edema Neuro: alert and oriented x 3 Psych: normal mood and affect  Data Reviewed:  Reviewed labs, radiology imaging, old records and pertinent past GI work up   Assessment and Plan/Recommendations: 34 year old female with history of bipolar disorder, ADD/ADHD, depression, anxiety here for follow-up visit for generalized abdominal pain, anorexia, nausea and weight loss  Her weight is relatively stable since May 2020, though she has not had significant weight drop in the past year she has not gained any weight either.  Subjective delay in gastric emptying and also has delayed small bowel transit.  Will obtain smart pill to evaluate whole gut transit.  If insurance does not approve smart pill, will plan to proceed with 4hr gastric emptying study  Continue with small frequent meals Pured diet/soft foods as tolerated High-calorie high-protein diet  IBS-constipation: Increase Linzess to 290 mcg daily.  Hold  MiraLAX Increased water intake  Advised patient to walk in for KUB if she notices swelling in the left upper quadrant associated with severe abdominal pain to identify if she has intermittent ?subacute gastric volvulus  Will refer to Dr. Derrill Kay at [facility] for further evaluation and management    This visit required 40 minutes of patient care (this includes precharting, chart review, review of results, face-to-face time used for counseling as well as treatment plan and follow-up. The patient was provided an opportunity to ask questions and all were answered. The patient agreed with the plan and demonstrated an understanding of the instructions.  Damaris Hippo , MD    CC: Antony Blackbird, MD

## 2020-06-28 ENCOUNTER — Encounter: Payer: Self-pay | Admitting: Family Medicine

## 2020-06-28 ENCOUNTER — Telehealth: Payer: Self-pay | Admitting: *Deleted

## 2020-06-28 ENCOUNTER — Telehealth: Payer: Medicaid Other

## 2020-06-28 ENCOUNTER — Ambulatory Visit: Payer: Medicaid Other | Attending: Family Medicine | Admitting: Family Medicine

## 2020-06-28 ENCOUNTER — Other Ambulatory Visit: Payer: Self-pay

## 2020-06-28 VITALS — BP 116/78 | HR 76 | Ht 62.0 in | Wt 100.4 lb

## 2020-06-28 DIAGNOSIS — L309 Dermatitis, unspecified: Secondary | ICD-10-CM

## 2020-06-28 DIAGNOSIS — E559 Vitamin D deficiency, unspecified: Secondary | ICD-10-CM | POA: Diagnosis not present

## 2020-06-28 DIAGNOSIS — F319 Bipolar disorder, unspecified: Secondary | ICD-10-CM

## 2020-06-28 MED ORDER — TRIAMCINOLONE ACETONIDE 0.1 % EX CREA: 1.0000 "application " | TOPICAL_CREAM | Freq: Two times a day (BID) | CUTANEOUS | 1 refills | Status: DC

## 2020-06-28 NOTE — Patient Instructions (Signed)

## 2020-06-28 NOTE — Progress Notes (Signed)
Has rash on right arm, states that it started a week ago with tiny little red bumps. Very itchy.  Would like referral to [facility] instead of psych.

## 2020-06-28 NOTE — Telephone Encounter (Signed)
Sent in referral to Milderd Meager today, Faxed office notes and demographics today [phone] Fax  Phone number  [phone]   I put on paperwork for them to attempt the Smart Pill there, for evaluation of whole gut transit

## 2020-06-28 NOTE — Progress Notes (Signed)
Subjective:  Patient ID: Rachel [name], female    DOB: 1986/08/02  Age: 34 y.o. MRN: [id number]  CC: Rash   HPI [name]  is a 34 year old female patient of Dr Chapman Fitch with a history of bipolar disorder, anxiety, depression, GERD, chronic abdominal pain seen today for an acute visit   A rash developed like little red dots in her R cubital fossa a week ago. She has had itching and has had to wrap up the lesion to prevent itching.  She woke up scratching herself and bleeding. Thinks she had something similar on the right side of her face last year.  She has no lesions in other body parts at the moment. Used HydroCortisone, Benadryl topical and oral pills with mild relief but then symptoms would return. Of note she had a fever a week ago with cough and some triggering of her allergies per patient.  She would like Behavioral Health referral to see a therapist rather than a psychiatrist as she is not interested in medications.  I had referred her to [facility] however she informs me she had read not so good reviews online which had her concerned. For her GI symptoms she could not tolerate Reglan as it had increased her bipolar symptoms and it took her a month to get over her symptoms.  She is currently being referred to a gastroparesis specialist at [facility].  Past Medical History:  Diagnosis Date  . ADHD (attention deficit hyperactivity disorder)   . Anemia   . Anxiety   . Asthma   . Bipolar disorder ([place])   . Delayed gastric emptying 02/23/2020  . Depression   . DUB (dysfunctional uterine bleeding) 02/23/2020  . GERD (gastroesophageal reflux disease)   . Hiatal hernia 04/06/2020  . Myalgia 04/06/2020  . Parasitic infection 02/23/2020  . Poor appetite 02/23/2020  . RUQ pain 02/23/2020  . Vitamin D deficiency   . Weight loss 02/23/2020    Past Surgical History:  Procedure Laterality Date  . ESOPHAGOGASTRODUODENOSCOPY  2011   and a ph  study as well.   [name] Kitchen NASAL SEPTUM SURGERY    . TONGUE FLAP    . WISDOM TOOTH EXTRACTION      Family History  Problem Relation Age of Onset  . Depression Mother   . Anxiety disorder Mother   . Hypertension Mother   . Hyperlipidemia Mother   . Asthma Mother   . Endometriosis Mother   . Fibroids Mother   . Depression Father   . Hypertension Father   . Hyperlipidemia Father   . Bipolar disorder Father   . Bipolar disorder Sister   . ADD / ADHD Sister   . Asthma Sister   . Endometriosis Sister   . Fibroids Sister   . Endometriosis Maternal Grandmother   . Fibroids Maternal Grandmother   . Colon cancer Maternal Grandmother     Allergies  Allergen Reactions  . Amphetamine-Dextroamphetamine Other (See Comments)    Unknown reaction  . Penicillins Swelling    Severe allergy per allergy test Did it involve swelling of the face/tongue/throat, SOB, or low BP? Yes Did it involve sudden or severe rash/hives, skin peeling, or any reaction on the inside of your mouth or nose? Unknown Did you need to seek medical attention at a hospital or doctor's office? Unknown When did it last happen?teenager If all above answers are "NO", may proceed with cephalosporin use.  [name] Kitchen Doxycycline Itching, Nausea And Vomiting and Other (See Comments)  Caused high fever  . Olanzapine Other (See Comments)    Unknown reaction  . Other     Stimulants cause unknown reaction  . Prozac [Fluoxetine Hcl] Other (See Comments)    Unknown reaction  . Reglan [Metoclopramide]     Causes her bipolar issues to trigger and muscle twitching and stiffness     Outpatient Medications Prior to Visit  Medication Sig Dispense Refill  . acetaminophen (TYLENOL) 500 MG tablet Take 1,000 mg by mouth every 6 (six) hours as needed for headache (pain).    [name] Kitchen albuterol (VENTOLIN HFA) 108 (90 Base) MCG/ACT inhaler Inhale 1-2 puffs into the lungs every 6 (six) hours as needed for wheezing or shortness of breath. 8 g 0  .  cholecalciferol (VITAMIN D3) 25 MCG (1000 UNIT) tablet Take 1,000 Units by mouth daily.    . fexofenadine (ALLEGRA) 180 MG tablet Take 180 mg by mouth daily.    [name] Kitchen linaclotide (LINZESS) 290 MCG CAPS capsule Take 1 capsule (290 mcg total) by mouth daily before breakfast. 30 capsule 3  . Multiple Vitamin (MULTIVITAMIN WITH MINERALS) TABS tablet Take 1 tablet by mouth at bedtime.    . naproxen sodium (ALEVE) 220 MG tablet Take 220 mg by mouth 2 (two) times daily.    . polyethylene glycol (MIRALAX / GLYCOLAX) 17 g packet Take 17 g by mouth daily. 1 capful daily    . Vitamin D, Ergocalciferol, (DRISDOL) 1.25 MG (50000 UNIT) CAPS capsule Take 1 capsule (50,000 Units total) by mouth every 7 (seven) days. One pill, once per week for 12 weeks (Patient not taking: Reported on 06/28/2020) 12 capsule 0   No facility-administered medications prior to visit.     ROS Review of Systems  Constitutional: Negative for activity change, appetite change and fatigue.  HENT: Negative for congestion, sinus pressure and sore throat.   Eyes: Negative for visual disturbance.  Respiratory: Negative for cough, chest tightness, shortness of breath and wheezing.   Cardiovascular: Negative for chest pain and palpitations.  Gastrointestinal: Negative for abdominal distention, abdominal pain and constipation.  Endocrine: Negative for polydipsia.  Genitourinary: Negative for dysuria and frequency.  Musculoskeletal: Negative for arthralgias and back pain.  Skin: Positive for rash.  Neurological: Negative for tremors, light-headedness and numbness.  Hematological: Does not bruise/bleed easily.  Psychiatric/Behavioral: Negative for agitation and behavioral problems.    Objective:  BP 116/78   Pulse 76   Ht 5\' 2"  (1.575 m)   Wt 100 lb 6.4 oz (45.5 kg)   SpO2 100%   BMI 18.36 kg/m   BP/Weight 06/28/2020 06/23/2020 06/23/9146  Systolic BP 829 90 562  Diastolic BP 78 60 83  Wt. (Lbs) 100.4 99.4 101.8  BMI 18.36 18.18  18.62  Some encounter information is confidential and restricted. Go to Review Flowsheets activity to see all data.      Physical Exam Constitutional:      Appearance: She is well-developed.  Neck:     Vascular: No JVD.  Cardiovascular:     Rate and Rhythm: Normal rate.     Heart sounds: Normal heart sounds. No murmur heard.   Pulmonary:     Effort: Pulmonary effort is normal.     Breath sounds: Normal breath sounds. No wheezing or rales.  Chest:     Chest wall: No tenderness.  Abdominal:     General: Bowel sounds are normal. There is no distension.     Palpations: Abdomen is soft. There is no mass.     Tenderness:  There is no abdominal tenderness.  Musculoskeletal:        General: Normal range of motion.     Right lower leg: No edema.     Left lower leg: No edema.  Skin:    Comments: See images below First picture taken by patient cell phone prior to this visit Second picture taken at this visit.  Neurological:     Mental Status: She is alert and oriented to person, place, and time.  Psychiatric:        Mood and Affect: Mood normal.          CMP Latest Ref Rng & Units 02/04/2020 01/16/2020 03/28/2019  Glucose 65 - 99 mg/dL 77 91 95  BUN 6 - 20 mg/dL 14 9 9   Creatinine 0.57 - 1.00 mg/dL 0.72 0.66 0.62  Sodium 134 - 144 mmol/L 142 140 142  Potassium 3.5 - 5.2 mmol/L 4.5 3.7 4.0  Chloride 96 - 106 mmol/L 107(H) 107 108  CO2 20 - 29 mmol/L 24 25 27   Calcium 8.7 - 10.2 mg/dL 9.5 9.7 9.6  Total Protein 6.5 - 8.1 g/dL - 6.6 6.8  Total Bilirubin 0.3 - 1.2 mg/dL - 0.7 0.6  Alkaline Phos 38 - 126 U/L - 36(L) 42  AST 15 - 41 U/L - 16 14(L)  ALT 0 - 44 U/L - 12 9    Lipid Panel  No results found for: CHOL, TRIG, HDL, CHOLHDL, VLDL, LDLCALC, LDLDIRECT  CBC    Component Value Date/Time   WBC 7.5 01/16/2020 1523   RBC 4.09 01/16/2020 1523   HGB 12.8 01/16/2020 1523   HCT 37.7 01/16/2020 1523   PLT 228 01/16/2020 1523   MCV 92.2 01/16/2020 1523   MCH 31.3  01/16/2020 1523   MCHC 34.0 01/16/2020 1523   RDW 12.4 01/16/2020 1523   LYMPHSABS 2.7 01/16/2020 1523   MONOABS 0.5 01/16/2020 1523   EOSABS 0.1 01/16/2020 1523   BASOSABS 0.0 01/16/2020 1523    No results found for: HGBA1C  Assessment & [name]:  1. Dermatitis Appearance of rash seems to have changed given application of multiple topical regimens We will place on triamcinolone if not improving refer to dermatology - triamcinolone cream (KENALOG) 0.1 %; Apply 1 application topically 2 (two) times daily.  Dispense: 45 g; Refill: 1 - CBC with Differential/Platelet  2. Vitamin D deficiency Completed course of Drisdol We will repeat vitamin D level - VITAMIN D 25 Hydroxy (Vit-D Deficiency, Fractures)  3.  Bipolar disorder She is currently not on treatment and is opposed to medications She prefers psychotherapy We have had a discussion about the fact that I have not had patients with bad experiences after visiting the neuropsychiatric center and in fact have had good reports.  She is open to trying any available place with a therapist. Tried to have her seen by LCSW in the clinic however she had to leave because her grandma was waiting for her We will place on LCSW schedule who will coordinate her seeing a therapist  Meds ordered this encounter  Medications  . triamcinolone cream (KENALOG) 0.1 %    Sig: Apply 1 application topically 2 (two) times daily.    Dispense:  45 g    Refill:  1    Follow-up: Return for Telehealth visit with [place]. PCP - for Medical conditions, keep previously scheduled appointment.Charlott Rakes, MD, FAAFP. [facility] and [place] [place], [place]   06/28/2020, 3:57  PM

## 2020-06-29 LAB — CBC WITH DIFFERENTIAL/PLATELET
Basophils Absolute: 0.1 10*3/uL (ref 0.0–0.2)
Basos: 1 %
EOS (ABSOLUTE): 0.2 10*3/uL (ref 0.0–0.4)
Eos: 2 %
Hematocrit: 40.1 % (ref 34.0–46.6)
Hemoglobin: 13 g/dL (ref 11.1–15.9)
Immature Grans (Abs): 0 10*3/uL (ref 0.0–0.1)
Immature Granulocytes: 0 %
Lymphocytes Absolute: 2.1 10*3/uL (ref 0.7–3.1)
Lymphs: 28 %
MCH: 31.2 pg (ref 26.6–33.0)
MCHC: 32.4 g/dL (ref 31.5–35.7)
MCV: 96 fL (ref 79–97)
Monocytes Absolute: 0.6 10*3/uL (ref 0.1–0.9)
Monocytes: 8 %
Neutrophils Absolute: 4.7 10*3/uL (ref 1.4–7.0)
Neutrophils: 61 %
Platelets: 310 10*3/uL (ref 150–450)
RBC: 4.17 x10E6/uL (ref 3.77–5.28)
RDW: 12.9 % (ref 11.7–15.4)
WBC: 7.6 10*3/uL (ref 3.4–10.8)

## 2020-06-29 LAB — VITAMIN D 25 HYDROXY (VIT D DEFICIENCY, FRACTURES): Vit D, 25-Hydroxy: 51.6 ng/mL (ref 30.0–100.0)

## 2020-07-05 ENCOUNTER — Ambulatory Visit: Payer: Medicaid Other | Attending: Critical Care Medicine | Admitting: Licensed Clinical Social Worker

## 2020-07-05 DIAGNOSIS — F319 Bipolar disorder, unspecified: Secondary | ICD-10-CM

## 2020-07-05 NOTE — Progress Notes (Signed)
Office Visit Note  Patient: Rachel [name]             Date of Birth: 06/23/1986           MRN: [id number]             PCP: Antony Blackbird, MD Referring: Antony Blackbird, MD Visit Date: 07/06/2020 Occupation: '@[email or web address]' @  Subjective:  Generalized pain.   History of Present Illness: Rachel [name] is a 34 y.o. female seen in consultation per request of Dr. Maia [name].  According to [facility] her symptoms a started as a child with a eating disorder.  She states that in 2009 she was diagnosed with gastroparesis at the time she was having nausea vomiting and was losing weight.  She had been under care of a gastroenterologist for a long time.  She states in the last 2 years she has lost a lot of weight.  She has been referred to a gastroparesis specialist at [facility].  She was also diagnosed with bipolar disorder and ADHD as a child.  She states she had intolerance to multiple medications and now she is doing only counseling.  For the last few years she has been also struggling with insomnia.  She has noted that for the last 2 years she is also experiencing increased sensitivity to touch.  She has been experiencing generalized pain which she describes on a scale of 0-10 about 7-8 mostly.  She states she has nocturnal pain and difficulty sleeping due to nocturnal pain.  The pain fluctuates at times.  She states there are days when she is unable to get out of the bed.  She had a work-related injury in 2018.  She states she lifted a bag of trash can at work and dislocated her left shoulder joint.  She states she was going through [employer] and then the case was closed.  She is unable to go back to work.  There is no family history of autoimmune disease in the family.  There is history of fibromyalgia in her mother.  She is gravida 0, para 0.  Activities of Daily Living:  Patient reports morning stiffness for 2  hours.   Patient Reports nocturnal pain.  Difficulty dressing/grooming:  Reports Difficulty climbing stairs: Denies Difficulty getting out of chair: Reports Difficulty using hands for taps, buttons, cutlery, and/or writing: Reports  Review of Systems  Constitutional: Positive for fatigue. Negative for night sweats, weight gain and weight loss.  HENT: Negative for mouth sores, trouble swallowing, trouble swallowing, mouth dryness and nose dryness.   Eyes: Negative for pain, redness, itching, visual disturbance and dryness.  Respiratory: Positive for shortness of breath. Negative for cough and difficulty breathing.        Asthma  Cardiovascular: Positive for palpitations. Negative for chest pain, hypertension, irregular heartbeat and swelling in legs/feet.  Gastrointestinal: Positive for abdominal pain, constipation and diarrhea. Negative for blood in stool.  Endocrine: Positive for cold intolerance. Negative for increased urination.  Genitourinary: Negative for difficulty urinating and vaginal dryness.  Musculoskeletal: Positive for arthralgias, joint pain, myalgias, muscle weakness, morning stiffness, muscle tenderness and myalgias. Negative for joint swelling.  Skin: Positive for rash. Negative for color change, hair loss, skin tightness, ulcers and sensitivity to sunlight.  Allergic/Immunologic: Positive for susceptible to infections.  Neurological: Positive for numbness, headaches and weakness. Negative for dizziness, memory loss and night sweats.  Hematological: Positive for bruising/bleeding tendency. Negative for swollen glands.  Psychiatric/Behavioral: Positive for depressed mood and sleep disturbance. Negative for  confusion. The patient is nervous/anxious.     PMFS History:  Patient Active Problem List   Diagnosis Date Noted  . Myalgia 04/06/2020  . Hiatal hernia 04/06/2020  . Parasitic infection 02/23/2020  . Weight loss 02/23/2020  . Poor appetite 02/23/2020  . RUQ pain 02/23/2020  . Delayed gastric emptying 02/23/2020  . DUB (dysfunctional  uterine bleeding) 02/23/2020  . Cervical radiculopathy 05/31/2018  . Hyperalgesia 05/31/2018  . Acid reflux 12/26/2017  . ADHD (attention deficit hyperactivity disorder) 05/07/2012  . Bipolar 1 disorder ([place]) 05/07/2012    Past Medical History:  Diagnosis Date  . ADHD (attention deficit hyperactivity disorder)   . Anemia   . Anxiety   . Asthma   . Bipolar disorder ([place])   . Delayed gastric emptying 02/23/2020  . Depression   . DUB (dysfunctional uterine bleeding) 02/23/2020  . GERD (gastroesophageal reflux disease)   . Hiatal hernia 04/06/2020  . Myalgia 04/06/2020  . Parasitic infection 02/23/2020  . Poor appetite 02/23/2020  . RUQ pain 02/23/2020  . Vitamin D deficiency   . Weight loss 02/23/2020    Family History  Problem Relation Age of Onset  . Depression Mother   . Anxiety disorder Mother   . Hypertension Mother   . Hyperlipidemia Mother   . Asthma Mother   . Endometriosis Mother   . Fibroids Mother   . Fibromyalgia Mother   . Depression Father   . Hypertension Father   . Hyperlipidemia Father   . Bipolar disorder Father   . Bipolar disorder Sister   . ADD / ADHD Sister   . Asthma Sister   . Endometriosis Sister   . Fibroids Sister   . Endometriosis Maternal Grandmother   . Fibroids Maternal Grandmother   . Colon cancer Maternal Grandmother   . Seizures Sister   . Bipolar disorder Sister   . Anxiety disorder Sister    Past Surgical History:  Procedure Laterality Date  . barium study    . COLONOSCOPY    . ESOPHAGOGASTRODUODENOSCOPY  2011   and a ph study as well.   [name] Kitchen NASAL SEPTUM SURGERY    . TONGUE FLAP    . WISDOM TOOTH EXTRACTION     Social History   Social History Narrative  . Not on file   Immunization History  Administered Date(s) Administered  . PFIZER SARS-COV-2 Vaccination 02/19/2020, 03/17/2020     Objective: Vital Signs: BP 114/82 (BP Location: Right Arm, Patient Position: Sitting, Cuff Size: Normal)   Pulse 73   Resp 13   Ht 5' 2.6"  (1.59 m)   Wt 99 lb 12.8 oz (45.3 kg)   BMI 17.91 kg/m    Physical Exam Vitals and nursing note reviewed.  Constitutional:      Appearance: She is well-developed.  HENT:     Head: Normocephalic and atraumatic.  Eyes:     Conjunctiva/sclera: Conjunctivae normal.  Cardiovascular:     Rate and Rhythm: Normal rate and regular rhythm.     Heart sounds: Normal heart sounds.  Pulmonary:     Effort: Pulmonary effort is normal.     Breath sounds: Normal breath sounds.  Abdominal:     General: Bowel sounds are normal.     Palpations: Abdomen is soft.  Musculoskeletal:     Cervical back: Normal range of motion.  Lymphadenopathy:     Cervical: No cervical adenopathy.  Skin:    General: Skin is warm and dry.     Capillary Refill: Capillary refill takes  less than 2 seconds.     Findings: Erythema present.     Comments: Erythema over the volar aspect of her right arm.  Neurological:     Mental Status: She is alert and oriented to person, place, and time.  Psychiatric:        Behavior: Behavior normal.      Musculoskeletal Exam: C-spine was in good range of motion.  Thoracic and lumbar spine with good range of motion.  She has discomfort range of motion of her left shoulder joint.  Elbow joints, wrist joints, MCPs PIPs and DIPs with good range of motion.  Hip joints, knee joints, ankles, MTPs and PIPs with good range of motion.  She has hypermobility in almost all of her joints.  She is some laxity of her skin.  No synovitis was noted.  She had generalized hyperalgesia and positive tender points.  CDAI Exam: CDAI Score: -- Patient Global: --; Provider Global: -- Swollen: --; Tender: -- Joint Exam 07/06/2020   No joint exam has been documented for this visit   There is currently no information documented on the homunculus. Go to the Rheumatology activity and complete the homunculus joint exam.  Investigation: No additional findings.  Imaging: No results found.  Recent  Labs: Lab Results  Component Value Date   WBC 7.6 06/28/2020   HGB 13.0 06/28/2020   PLT 310 06/28/2020   NA 142 02/04/2020   K 4.5 02/04/2020   CL 107 (H) 02/04/2020   CO2 24 02/04/2020   GLUCOSE 77 02/04/2020   BUN 14 02/04/2020   CREATININE 0.72 02/04/2020   BILITOT 0.7 01/16/2020   ALKPHOS 36 (L) 01/16/2020   AST 16 01/16/2020   ALT 12 01/16/2020   PROT 6.6 01/16/2020   ALBUMIN 4.3 01/16/2020   CALCIUM 9.5 02/04/2020   GFRAA 128 02/04/2020   June 28, 2020 CBC normal, Mar 18, 2020 vitamin D 19.4, MPO negative, serum proteinase 3 -, ESR 11, March 2021 TSH normal, CMP normal Speciality Comments: No specialty comments available.  Procedures:  No procedures performed Allergies: Amphetamine-dextroamphetamine, Penicillins, Doxycycline, Olanzapine, Other, Prozac [fluoxetine hcl], and Reglan [metoclopramide]   Assessment / Plan:     Visit Diagnoses: Myalgia -she continues to have generalized pain in all of her muscles per patient.  She states the pain level is anywhere from 7-8.  She had no muscle weakness on my examination.  Based on generalized hyperalgesia positive tender points and pain I believe she has myofascial pain syndrome.  Detailed counseling was provided.  Good sleep hygiene and isometric exercises were emphasized.  She will benefit from water aerobics.  Plan: CK  Polyarthralgia -she complains of pain and discomfort in all of her joints and muscles.  No joint swelling or tenderness was noted on examination.  Plan: Rheumatoid factor, ANA  Hypermobility of joint-she has hypermobility in most of her joints.  We will refer her to sports medicine for evaluation and recommendations on isometric exercises.  Water aerobics were encouraged.  Left shoulder pain-patient reports a work injury in 2018 when she dislocated her left shoulder.  She has chronic left shoulder pain since then.  She also has limited range of motion of her left shoulder.  She had thorough work-up in the  past.  Palpitations-she has been experiencing palpitations.  As she has hypermobility and palpitations we will refer her to cardiology.  Other fatigue -she complains of extreme fatigue.  She states there are days when she cannot schedule for that.  Plan: Serum  protein electrophoresis with reflex  Primary insomnia-she gives history of insomnia for many years.  She has been taking melatonin.  Good sleep hygiene was discussed.  History of gastroesophageal reflux (GERD)  Helane Rima states she has been struggling with gastroparesis for many years.  She has been referred to gastroparesis expert at [facility].  History of asthma  History of vitamin D deficiency-she has vitamin D deficiency most likely due to poor nutrition.  History of bipolar disorder-she states that she has tried several medications in the past and has intolerance to most medications.  History of ADHD  History of migraine  Other eczema  DUB (dysfunctional uterine bleeding)  Family history of fibromyalgia - Mother.  Patient states that I diagnosed her mother with fibromyalgia several years ago.  Orders: Orders Placed This Encounter  Procedures  . CK  . Rheumatoid factor  . ANA  . Serum protein electrophoresis with reflex   No orders of the defined types were placed in this encounter.   .  Follow-Up Instructions: Return for Myofascial pain, hypermobility.   Bo Merino, MD  Note - This record has been created using Editor, commissioning.  Chart creation errors have been sought, but may not always  have been located. Such creation errors do not reflect on  the standard of medical care.

## 2020-07-06 ENCOUNTER — Encounter: Payer: Self-pay | Admitting: Rheumatology

## 2020-07-06 ENCOUNTER — Ambulatory Visit: Payer: Medicaid Other | Admitting: Rheumatology

## 2020-07-06 ENCOUNTER — Other Ambulatory Visit: Payer: Self-pay

## 2020-07-06 VITALS — BP 114/82 | HR 73 | Resp 13 | Ht 62.6 in | Wt 99.8 lb

## 2020-07-06 DIAGNOSIS — G8929 Other chronic pain: Secondary | ICD-10-CM

## 2020-07-06 DIAGNOSIS — K3184 Gastroparesis: Secondary | ICD-10-CM

## 2020-07-06 DIAGNOSIS — M249 Joint derangement, unspecified: Secondary | ICD-10-CM | POA: Diagnosis not present

## 2020-07-06 DIAGNOSIS — L308 Other specified dermatitis: Secondary | ICD-10-CM | POA: Diagnosis not present

## 2020-07-06 DIAGNOSIS — M25512 Pain in left shoulder: Secondary | ICD-10-CM

## 2020-07-06 DIAGNOSIS — R5383 Other fatigue: Secondary | ICD-10-CM | POA: Diagnosis not present

## 2020-07-06 DIAGNOSIS — Z8669 Personal history of other diseases of the nervous system and sense organs: Secondary | ICD-10-CM

## 2020-07-06 DIAGNOSIS — Z8719 Personal history of other diseases of the digestive system: Secondary | ICD-10-CM | POA: Diagnosis not present

## 2020-07-06 DIAGNOSIS — Z8709 Personal history of other diseases of the respiratory system: Secondary | ICD-10-CM

## 2020-07-06 DIAGNOSIS — R002 Palpitations: Secondary | ICD-10-CM

## 2020-07-06 DIAGNOSIS — F5101 Primary insomnia: Secondary | ICD-10-CM

## 2020-07-06 DIAGNOSIS — Z8659 Personal history of other mental and behavioral disorders: Secondary | ICD-10-CM

## 2020-07-06 DIAGNOSIS — M2550 Pain in unspecified joint: Secondary | ICD-10-CM | POA: Diagnosis not present

## 2020-07-06 DIAGNOSIS — Z8639 Personal history of other endocrine, nutritional and metabolic disease: Secondary | ICD-10-CM | POA: Diagnosis not present

## 2020-07-06 DIAGNOSIS — N938 Other specified abnormal uterine and vaginal bleeding: Secondary | ICD-10-CM

## 2020-07-06 DIAGNOSIS — M791 Myalgia, unspecified site: Secondary | ICD-10-CM | POA: Diagnosis not present

## 2020-07-06 DIAGNOSIS — Z8269 Family history of other diseases of the musculoskeletal system and connective tissue: Secondary | ICD-10-CM

## 2020-07-06 NOTE — Addendum Note (Signed)
Addended by: Earnestine Mealing on: 07/06/2020 09:32 AM   Modules accepted: Orders

## 2020-07-07 ENCOUNTER — Telehealth: Payer: Self-pay | Admitting: Rheumatology

## 2020-07-07 NOTE — Telephone Encounter (Signed)
Patient left a voicemail stating she has been researching EDS with hypermobility that Dr. Estanislado Pandy mentioned at her appointment.  Patient states that diagnosis would actually explain a lot of her life.  Patient states her joints were very flexible and popped a lot, she also had multiple injuries in the same joints.  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  Patient would like to join a support group and requested a return call.

## 2020-07-08 NOTE — Progress Notes (Signed)
ANA is low titer positive.  Please add ENA, C3 and C4.

## 2020-07-08 NOTE — Telephone Encounter (Signed)
Patient states she already has an appointment schedule with sports medicine in the middle September 2021. Patient states she was not looking for support group information.

## 2020-07-08 NOTE — Telephone Encounter (Signed)
I am not aware of support group.  Please refer her to sports medicine .

## 2020-07-12 LAB — TEST AUTHORIZATION

## 2020-07-12 LAB — PROTEIN ELECTROPHORESIS, SERUM, WITH REFLEX
Albumin ELP: 5 g/dL — ABNORMAL HIGH (ref 3.8–4.8)
Alpha 1: 0.3 g/dL (ref 0.2–0.3)
Alpha 2: 0.6 g/dL (ref 0.5–0.9)
Beta 2: 0.3 g/dL (ref 0.2–0.5)
Beta Globulin: 0.4 g/dL (ref 0.4–0.6)
Gamma Globulin: 1 g/dL (ref 0.8–1.7)
Total Protein: 7.6 g/dL (ref 6.1–8.1)

## 2020-07-12 LAB — CK: Total CK: 40 U/L (ref 29–143)

## 2020-07-12 LAB — ANTI-SCLERODERMA ANTIBODY: Scleroderma (Scl-70) (ENA) Antibody, IgG: <1 AI

## 2020-07-12 LAB — ANA: Anti Nuclear Antibody (ANA): POSITIVE — AB

## 2020-07-12 LAB — SM AND SM/RNP ANTIBODIES
ENA SM Ab Ser-aCnc: <1 AI
SM/RNP: <1 AI

## 2020-07-12 LAB — C3 AND C4

## 2020-07-12 LAB — ANTI-DNA ANTIBODY, DOUBLE-STRANDED: ds DNA Ab: <1 IU/mL

## 2020-07-12 LAB — ANTI-NUCLEAR AB-TITER (ANA TITER): ANA Titer 1: 1:80 {titer} — ABNORMAL HIGH

## 2020-07-12 LAB — SJOGREN'S SYNDROME ANTIBODS(SSA + SSB)
SSA (Ro) (ENA) Antibody, IgG: <1 AI
SSB (La) (ENA) Antibody, IgG: <1 AI

## 2020-07-12 LAB — RHEUMATOID FACTOR: Rheumatoid fact SerPl-aCnc: <14 IU/mL (ref <14)

## 2020-07-12 NOTE — Progress Notes (Signed)
I will discuss results at the fu appointment.

## 2020-07-14 ENCOUNTER — Other Ambulatory Visit: Payer: Self-pay

## 2020-07-14 ENCOUNTER — Ambulatory Visit: Payer: Medicaid Other | Attending: Family Medicine | Admitting: Family Medicine

## 2020-07-14 ENCOUNTER — Encounter: Payer: Self-pay | Admitting: Family Medicine

## 2020-07-14 VITALS — BP 121/83 | HR 79 | Ht 62.0 in | Wt 101.8 lb

## 2020-07-14 DIAGNOSIS — K582 Mixed irritable bowel syndrome: Secondary | ICD-10-CM | POA: Diagnosis not present

## 2020-07-14 DIAGNOSIS — R636 Underweight: Secondary | ICD-10-CM

## 2020-07-14 DIAGNOSIS — M791 Myalgia, unspecified site: Secondary | ICD-10-CM

## 2020-07-14 DIAGNOSIS — L309 Dermatitis, unspecified: Secondary | ICD-10-CM | POA: Diagnosis not present

## 2020-07-14 DIAGNOSIS — K3184 Gastroparesis: Secondary | ICD-10-CM | POA: Diagnosis not present

## 2020-07-14 DIAGNOSIS — Z23 Encounter for immunization: Secondary | ICD-10-CM

## 2020-07-14 DIAGNOSIS — F319 Bipolar disorder, unspecified: Secondary | ICD-10-CM | POA: Diagnosis not present

## 2020-07-14 MED ORDER — LACTULOSE 10 GM/15ML PO SOLN: 10.0000 g | Freq: Three times a day (TID) | ORAL | 1 refills | Status: DC | PRN

## 2020-07-14 MED ORDER — TRIAMCINOLONE ACETONIDE 0.1 % EX CREA: 1.0000 "application " | TOPICAL_CREAM | Freq: Two times a day (BID) | CUTANEOUS | 1 refills | Status: DC

## 2020-07-14 NOTE — Progress Notes (Signed)
Subjective:  Patient ID: Rachel [name], female    DOB: 1986/08/10  Age: 34 y.o. MRN: [id number]  CC: Rash   HPI Joscelin Fray is a 34 year old femalepatient of Dr Conni Slipper a history of bipolar disorder, anxiety, depression, GERD, chronic abdominal pain seen today for a follow-up visit. Treated for right forearm rash with triamcinolone 2 weeks ago and she reports improvement with reduced erythema and no pruritus but it does have scaling over it.  She has no rash in other body parts.  Seen by Rheumatology recently and had labs done with elevated ANA, speckled pattern, scleroderma labs are negative.  Notes reviewed with indication for possible myofascial pain syndrome. She has been referred to Cardiology due to palpitations and feels like her heart will beat out of her chest.  Also referred by rheumatology to sports medicine due to hypermobility of her joints.  Currently awaiting an appointment with the gastroparesis specialist at [facility] but she saw her local GI 2 weeks ago and Linzess dose was increased. Whenever Linzess dose is increased she gets constipated even with Miralax use and when she finally has a bowel movement it is explosive and it takes a long time to get to that point that she is constipated prior to it. She is yet to schedule an appointment for management of her bipolar disorder due to multiple appointments going on. Has questions about the COVID-19 booster vaccine which I have informed her I would recommend she receive. She is accompanied by her mom to today's visit. Past Medical History:  Diagnosis Date  . ADHD (attention deficit hyperactivity disorder)   . Anemia   . Anxiety   . Asthma   . Bipolar disorder ([place])   . Delayed gastric emptying 02/23/2020  . Depression   . DUB (dysfunctional uterine bleeding) 02/23/2020  . GERD (gastroesophageal reflux disease)   . Hiatal hernia 04/06/2020  . Myalgia 04/06/2020  . Parasitic infection  02/23/2020  . Poor appetite 02/23/2020  . RUQ pain 02/23/2020  . Vitamin D deficiency   . Weight loss 02/23/2020    Past Surgical History:  Procedure Laterality Date  . barium study    . COLONOSCOPY    . ESOPHAGOGASTRODUODENOSCOPY  2011   and a ph study as well.   [name] Kitchen NASAL SEPTUM SURGERY    . TONGUE FLAP    . WISDOM TOOTH EXTRACTION      Family History  Problem Relation Age of Onset  . Depression Mother   . Anxiety disorder Mother   . Hypertension Mother   . Hyperlipidemia Mother   . Asthma Mother   . Endometriosis Mother   . Fibroids Mother   . Fibromyalgia Mother   . Depression Father   . Hypertension Father   . Hyperlipidemia Father   . Bipolar disorder Father   . Bipolar disorder Sister   . ADD / ADHD Sister   . Asthma Sister   . Endometriosis Sister   . Fibroids Sister   . Endometriosis Maternal Grandmother   . Fibroids Maternal Grandmother   . Colon cancer Maternal Grandmother   . Seizures Sister   . Bipolar disorder Sister   . Anxiety disorder Sister     Allergies  Allergen Reactions  . Amphetamine-Dextroamphetamine Other (See Comments)    Unknown reaction  . Penicillins Swelling    Severe allergy per allergy test Did it involve swelling of the face/tongue/throat, SOB, or low BP? Yes Did it involve sudden or severe rash/hives, skin peeling, or any  reaction on the inside of your mouth or nose? Unknown Did you need to seek medical attention at a hospital or doctor's office? Unknown When did it last happen?teenager If all above answers are "NO", may proceed with cephalosporin use.  [name] Kitchen Doxycycline Itching, Nausea And Vomiting and Other (See Comments)    Caused high fever  . Olanzapine Other (See Comments)    Unknown reaction  . Other     Stimulants cause unknown reaction  . Prozac [Fluoxetine Hcl] Other (See Comments)    Unknown reaction  . Reglan [Metoclopramide]     Causes her bipolar issues to trigger and muscle twitching and stiffness      Outpatient Medications Prior to Visit  Medication Sig Dispense Refill  . acetaminophen (TYLENOL) 500 MG tablet Take 1,000 mg by mouth every 6 (six) hours as needed for headache (pain).    [name] Kitchen albuterol (VENTOLIN HFA) 108 (90 Base) MCG/ACT inhaler Inhale 1-2 puffs into the lungs every 6 (six) hours as needed for wheezing or shortness of breath. 8 g 0  . cholecalciferol (VITAMIN D3) 25 MCG (1000 UNIT) tablet Take 1,000 Units by mouth daily.    . fexofenadine (ALLEGRA) 180 MG tablet Take 180 mg by mouth daily.    [name] Kitchen linaclotide (LINZESS) 290 MCG CAPS capsule Take 1 capsule (290 mcg total) by mouth daily before breakfast. 30 capsule 3  . MELATONIN PO Take by mouth at bedtime.    . Multiple Vitamin (MULTIVITAMIN WITH MINERALS) TABS tablet Take 1 tablet by mouth at bedtime.    . naproxen sodium (ALEVE) 220 MG tablet Take 220 mg by mouth as needed.     . polyethylene glycol (MIRALAX / GLYCOLAX) 17 g packet Take 17 g by mouth daily. 1 capful daily    . triamcinolone cream (KENALOG) 0.1 % Apply 1 application topically 2 (two) times daily. 45 g 1   No facility-administered medications prior to visit.     ROS Review of Systems  Constitutional: Positive for appetite change, fatigue and unexpected weight change. Negative for activity change.  HENT: Negative for congestion, sinus pressure and sore throat.   Eyes: Negative for visual disturbance.  Respiratory: Negative for cough, chest tightness, shortness of breath and wheezing.   Cardiovascular: Positive for palpitations. Negative for chest pain.  Gastrointestinal: Positive for abdominal pain, constipation and nausea. Negative for abdominal distention.  Endocrine: Negative for polydipsia.  Genitourinary: Negative for dysuria and frequency.  Musculoskeletal: Positive for myalgias. Negative for arthralgias and back pain.  Skin: Positive for rash.  Neurological: Negative for tremors, light-headedness and numbness.  Hematological: Bruises/bleeds  easily.  Psychiatric/Behavioral: Negative for agitation and behavioral problems.    Objective:  BP 121/83   Pulse 79   Ht 5\' 2"  (1.575 m)   Wt 101 lb 12.8 oz (46.2 kg)   SpO2 97%   BMI 18.62 kg/m   BP/Weight 07/14/2020 07/06/2020 4/33/2951  Systolic BP 884 166 063  Diastolic BP 83 82 78  Wt. (Lbs) 101.8 99.8 100.4  BMI 18.62 17.91 18.36  Some encounter information is confidential and restricted. Go to Review Flowsheets activity to see all data.      Physical Exam Constitutional:      Appearance: She is well-developed.  Neck:     Vascular: No JVD.  Cardiovascular:     Rate and Rhythm: Normal rate.     Heart sounds: Normal heart sounds. No murmur heard.   Pulmonary:     Effort: Pulmonary effort is normal.  Breath sounds: Normal breath sounds. No wheezing or rales.  Chest:     Chest wall: No tenderness.  Abdominal:     General: Bowel sounds are normal. There is no distension.     Palpations: Abdomen is soft. There is no mass.     Tenderness: There is no abdominal tenderness (diffuse).  Musculoskeletal:        General: Normal range of motion.     Right lower leg: No edema.     Left lower leg: No edema.  Skin:    Comments: Right forearm scaly rash which has improved since last visit with absence of erythema.  Rash is now normopigmented.  Neurological:     Mental Status: She is alert and oriented to person, place, and time.  Psychiatric:        Mood and Affect: Mood normal.     CMP Latest Ref Rng & Units 07/06/2020 02/04/2020 01/16/2020  Glucose 65 - 99 mg/dL - 77 91  BUN 6 - 20 mg/dL - 14 9  Creatinine 0.57 - 1.00 mg/dL - 0.72 0.66  Sodium 134 - 144 mmol/L - 142 140  Potassium 3.5 - 5.2 mmol/L - 4.5 3.7  Chloride 96 - 106 mmol/L - 107(H) 107  CO2 20 - 29 mmol/L - 24 25  Calcium 8.7 - 10.2 mg/dL - 9.5 9.7  Total Protein 6.1 - 8.1 g/dL 7.6 - 6.6  Total Bilirubin 0.3 - 1.2 mg/dL - - 0.7  Alkaline Phos 38 - 126 U/L - - 36(L)  AST 15 - 41 U/L - - 16  ALT 0 - 44  U/L - - 12    Lipid Panel  No results found for: CHOL, TRIG, HDL, CHOLHDL, VLDL, LDLCALC, LDLDIRECT  CBC    Component Value Date/Time   WBC 7.6 06/28/2020 1600   WBC 7.5 01/16/2020 1523   RBC 4.17 06/28/2020 1600   RBC 4.09 01/16/2020 1523   HGB 13.0 06/28/2020 1600   HCT 40.1 06/28/2020 1600   PLT 310 06/28/2020 1600   MCV 96 06/28/2020 1600   MCH 31.2 06/28/2020 1600   MCH 31.3 01/16/2020 1523   MCHC 32.4 06/28/2020 1600   MCHC 34.0 01/16/2020 1523   RDW 12.9 06/28/2020 1600   LYMPHSABS 2.1 06/28/2020 1600   MONOABS 0.5 01/16/2020 1523   EOSABS 0.2 06/28/2020 1600   BASOSABS 0.1 06/28/2020 1600    No results found for: HGBA1C  Assessment & [name]:  1. Dermatitis Improving - triamcinolone cream (KENALOG) 0.1 %; Apply 1 application topically 2 (two) times daily.  Dispense: 45 g; Refill: 1  2. Bipolar 1 disorder ([place]) Currently not on medications as she is weary of side effects Yet to schedule an appointment for psychotherapy but promises to do so  3. Myalgia Currently being worked up by rheumatology with suspicion of myofascial pain syndrome Discussed role of yoga, aquatic therapy, message.  Patient would like something that is covered by Medicaid as she currently does not have the finances. We will refer to PT - Ambulatory referral to Physical Therapy  4. Gastroparesis Uncontrolled Unable to tolerate Reglan Awaiting appointment with gastroparesis specialist at [facility]  5. Underweight Secondary to poor oral intake and gastroparesis  6. Irritable bowel syndrome with both constipation and diarrhea Uncontrolled Linzess currently worsens her constipation Placed on lactulose which she will titrate according to her symptoms - lactulose (CHRONULAC) 10 GM/15ML solution; Take 15 mLs (10 g total) by mouth 3 (three) times daily as needed for mild constipation.  Dispense: 946 mL; Refill: 1    Meds ordered this encounter  Medications  . lactulose (CHRONULAC) 10  GM/15ML solution    Sig: Take 15 mLs (10 g total) by mouth 3 (three) times daily as needed for mild constipation.    Dispense:  946 mL    Refill:  1  . triamcinolone cream (KENALOG) 0.1 %    Sig: Apply 1 application topically 2 (two) times daily.    Dispense:  45 g    Refill:  1    Follow-up: Return in about 3 months (around 10/13/2020) for Chronic medical management.       Charlott Rakes, MD, FAAFP. [facility] and [place], [place]   07/14/2020, 10:45 AM

## 2020-07-14 NOTE — Progress Notes (Signed)
Has been seen by Rheumatology.  Wants to discuss booster vaccine.  Requesting refill on cream.  Rash has cleared some.

## 2020-07-14 NOTE — Patient Instructions (Signed)

## 2020-07-14 NOTE — BH Specialist Note (Signed)
Call placed to patient.  LCSW introduced self and explained role at community care clinics.  Patient shared interest in establishing behavioral health services to assist with management of anxiety and depression symptoms.  Patient was referred to neuropsychiatric care center however was concerned with online ratings therefore is interested in establishing with a different agency.  LCSW informed patient on how to obtain behavioral health services in network with current insurance provider.  Patient was appreciative of assistance and agreed to contact LCSW with any questions or concerns that may arise.  No additional concerns noted

## 2020-07-22 ENCOUNTER — Ambulatory Visit (INDEPENDENT_AMBULATORY_CARE_PROVIDER_SITE_OTHER): Payer: Medicaid Other

## 2020-07-22 DIAGNOSIS — R002 Palpitations: Secondary | ICD-10-CM

## 2020-07-23 NOTE — Progress Notes (Signed)
Office Visit Note  Patient: Rachel [name]             Date of Birth: Jul 17, 1986           MRN: [id number]             PCP: Charlott Rakes, MD Referring: Antony Blackbird, MD Visit Date: 07/29/2020 Occupation: @GUAROCC @  Subjective:  Pain (Burning, shooting pains throughout entire body)   History of Present Illness: Rachel [name] is a 34 y.o. female feet hypermobility arthralgia myofascial pain syndrome.  She returns for follow-up visit today.  She also had her mother on the face time during the conversation.  Her mother has fibromyalgia.  According the patient she continues to have generalized achiness and burning sensation.  She also has brain fog and have concentration reading.  She denies any history of joint swelling but all of her muscles and joints ache.  She states she also has been reading about hypermobility and she fits the criteria.  She is concerned that she may have Ehlers-Danlos syndrome.  Activities of Daily Living:  Patient reports morning stiffness for 2 hours.   Patient Reports nocturnal pain.  Difficulty dressing/grooming: Reports Difficulty climbing stairs: Reports  Difficulty getting out of chair: Reports Difficulty using hands for taps, buttons, cutlery, and/or writing: Reports  Review of Systems  Constitutional: Positive for fatigue. Negative for night sweats, weight gain and weight loss.  HENT: Positive for mouth dryness. Negative for mouth sores, trouble swallowing, trouble swallowing and nose dryness.   Eyes: Positive for visual disturbance and dryness. Negative for photophobia, pain and redness.  Respiratory: Positive for difficulty breathing. Negative for cough and shortness of breath.   Cardiovascular: Positive for palpitations. Negative for chest pain, hypertension, irregular heartbeat and swelling in legs/feet.  Gastrointestinal: Positive for abdominal pain, constipation and diarrhea. Negative for blood in stool.  Endocrine: Positive for  increased urination.  Genitourinary: Negative for difficulty urinating, painful urination and vaginal dryness.  Musculoskeletal: Positive for arthralgias, joint pain, myalgias, muscle weakness, morning stiffness, muscle tenderness and myalgias. Negative for joint swelling.  Skin: Positive for redness. Negative for color change, rash, hair loss, skin tightness, ulcers and sensitivity to sunlight.  Allergic/Immunologic: Positive for susceptible to infections.  Neurological: Positive for dizziness, numbness and headaches. Negative for memory loss, night sweats and weakness.  Hematological: Positive for bruising/bleeding tendency. Negative for swollen glands.  Psychiatric/Behavioral: Positive for confusion and sleep disturbance. Negative for depressed mood. The patient is not nervous/anxious.     PMFS History:  Patient Active Problem List   Diagnosis Date Noted  . Palpitations 07/26/2020  . Family history of connective tissue disease 07/26/2020  . Myalgia 04/06/2020  . Hiatal hernia 04/06/2020  . Parasitic infection 02/23/2020  . Weight loss 02/23/2020  . Poor appetite 02/23/2020  . RUQ pain 02/23/2020  . Delayed gastric emptying 02/23/2020  . DUB (dysfunctional uterine bleeding) 02/23/2020  . Cervical radiculopathy 05/31/2018  . Hyperalgesia 05/31/2018  . Acid reflux 12/26/2017  . ADHD (attention deficit hyperactivity disorder) 05/07/2012  . Bipolar 1 disorder ([place]) 05/07/2012    Past Medical History:  Diagnosis Date  . ADHD (attention deficit hyperactivity disorder)   . Anemia   . Anxiety   . Asthma   . Bipolar disorder ([place])   . Delayed gastric emptying 02/23/2020  . Depression   . DUB (dysfunctional uterine bleeding) 02/23/2020  . GERD (gastroesophageal reflux disease)   . Hiatal hernia 04/06/2020  . Myalgia 04/06/2020  . Parasitic infection 02/23/2020  . Poor  appetite 02/23/2020  . RUQ pain 02/23/2020  . Vitamin D deficiency   . Weight loss 02/23/2020    Family History    Problem Relation Age of Onset  . Depression Mother   . Anxiety disorder Mother   . Hypertension Mother   . Hyperlipidemia Mother   . Asthma Mother   . Endometriosis Mother   . Fibroids Mother   . Fibromyalgia Mother   . Migraines Mother   . Depression Father   . Hypertension Father   . Hyperlipidemia Father   . Bipolar disorder Father   . Bipolar disorder Sister   . ADD / ADHD Sister   . Asthma Sister   . Endometriosis Sister   . Endometriosis Maternal Grandmother   . Fibroids Maternal Grandmother   . Hyperlipidemia Maternal Grandmother   . Hypertension Maternal Grandmother   . Diabetes Maternal Grandmother   . Fibromyalgia Maternal Grandmother   . Seizures Sister   . Bipolar disorder Sister   . Anxiety disorder Sister    Past Surgical History:  Procedure Laterality Date  . barium study    . COLONOSCOPY    . ESOPHAGOGASTRODUODENOSCOPY  2011   and a ph study as well.   [name] Kitchen NASAL SEPTUM SURGERY    . TONGUE FLAP    . WISDOM TOOTH EXTRACTION     Social History   Social History Narrative  . Not on file   Immunization History  Administered Date(s) Administered  . Influenza,inj,Quad PF,6+ Mos 07/14/2020  . PFIZER SARS-COV-2 Vaccination 02/19/2020, 03/17/2020     Objective: Vital Signs: BP 118/81 (BP Location: Left Arm, Patient Position: Sitting, Cuff Size: Small)   Pulse 78   Resp 14   Ht 5\' 2"  (1.575 m)   Wt 101 lb 6.4 oz (46 kg)   BMI 18.55 kg/m    Physical Exam Vitals and nursing note reviewed.  Constitutional:      Appearance: She is well-developed.  HENT:     Head: Normocephalic and atraumatic.  Eyes:     Conjunctiva/sclera: Conjunctivae normal.  Cardiovascular:     Rate and Rhythm: Normal rate and regular rhythm.     Heart sounds: Normal heart sounds.  Pulmonary:     Effort: Pulmonary effort is normal.     Breath sounds: Normal breath sounds.  Abdominal:     General: Bowel sounds are normal.     Palpations: Abdomen is soft.  Musculoskeletal:      Cervical back: Normal range of motion.  Lymphadenopathy:     Cervical: No cervical adenopathy.  Skin:    General: Skin is warm and dry.     Capillary Refill: Capillary refill takes less than 2 seconds.  Neurological:     Mental Status: She is alert and oriented to person, place, and time.  Psychiatric:        Behavior: Behavior normal.      Musculoskeletal Exam: C-spine thoracic and lumbar spine were in good range of motion. Shoulder joints, elbow joints, wrist joints, MCPs PIPs and DIPs with good range of motion with no synovitis. Hip joints, knee joints, ankles, MTPs and PIPs with good range of motion with no synovitis. She has hypermobility in all of her joints.  CDAI Exam: CDAI Score: -- Patient Global: --; Provider Global: -- Swollen: --; Tender: -- Joint Exam 07/29/2020   No joint exam has been documented for this visit   There is currently no information documented on the homunculus. Go to the Rheumatology activity and complete the homunculus  joint exam.  Investigation: No additional findings.  Imaging: No results found.  Recent Labs: Lab Results  Component Value Date   WBC 7.6 06/28/2020   HGB 13.0 06/28/2020   PLT 310 06/28/2020   NA 142 02/04/2020   K 4.5 02/04/2020   CL 107 (H) 02/04/2020   CO2 24 02/04/2020   GLUCOSE 77 02/04/2020   BUN 14 02/04/2020   CREATININE 0.72 02/04/2020   BILITOT 0.7 01/16/2020   ALKPHOS 36 (L) 01/16/2020   AST 16 01/16/2020   ALT 12 01/16/2020   PROT 7.6 07/06/2020   ALBUMIN 4.3 01/16/2020   CALCIUM 9.5 02/04/2020   GFRAA 128 02/04/2020  July 06, 2020 SPEP nonspecific, ANA 1: 80 speckled, ENA negative,  RF negative, CK 40  Speciality Comments: No specialty comments available.  Procedures:  No procedures performed Allergies: Amphetamine-dextroamphetamine, Penicillins, Doxycycline, Olanzapine, Other, Prozac [fluoxetine hcl], and Reglan [metoclopramide]   Assessment / Plan:     Visit Diagnoses: Myofascial pain -  History of generalized severe pain, hyperalgesia and positive tender points.  CK normal.  Advised physical therapy at the last visit.  Patient has an appointment coming up.  Need for daily exercise including water aerobics and swimming was emphasized.  The stretching exercises, yoga and meditation were discussed.  Primary insomnia-we had detailed discussion regarding good sleep hygiene.  Other fatigue-related to myofascial pain and insomnia.  Hypermobility arthralgia - Isometric exercises were advised.  Water aerobics was advised. I will refer her to sports medicine for evaluation and advice.  Positive ANA- All autoimmune work-up was negative.  ANA was low titer and not significant.  ENA was negative.  She has no clinical features of autoimmune disease.  Chronic left shoulder pain - According to patient she had work-up in the past.  She has limited range of motion.  Palpitations - Patient was referred to cardiology.  History of vitamin D deficiency  History of gastroesophageal reflux (GERD)  Gastroparesis - History of gastroparesis for many years.  History of asthma  History of bipolar disorder - Intolerance to most medications per patient.  I discussed with her that Cymbalta could be helpful in the myofascial pain but she is hesitant to try the medication.  History of ADHD  History of migraine-she has frequent migraine headaches.  It is not unusual to see migraines with myofascial pain syndrome.  Other eczema  DUB (dysfunctional uterine bleeding)  Family history of fibromyalgia-mother  Orders: No orders of the defined types were placed in this encounter.  No orders of the defined types were placed in this encounter.     Follow-Up Instructions: Return if symptoms worsen or fail to improve, for Hypermobility, myofascial pain.   Bo Merino, MD  Note - This record has been created using Editor, commissioning.  Chart creation errors have been sought, but may not always   have been located. Such creation errors do not reflect on  the standard of medical care.

## 2020-07-26 ENCOUNTER — Other Ambulatory Visit: Payer: Self-pay

## 2020-07-26 ENCOUNTER — Encounter: Payer: Self-pay | Admitting: Internal Medicine

## 2020-07-26 ENCOUNTER — Ambulatory Visit (INDEPENDENT_AMBULATORY_CARE_PROVIDER_SITE_OTHER): Payer: Medicaid Other | Admitting: Internal Medicine

## 2020-07-26 VITALS — BP 110/60 | Ht 62.0 in | Wt 100.2 lb

## 2020-07-26 DIAGNOSIS — R002 Palpitations: Secondary | ICD-10-CM | POA: Insufficient documentation

## 2020-07-26 DIAGNOSIS — Z8269 Family history of other diseases of the musculoskeletal system and connective tissue: Secondary | ICD-10-CM | POA: Diagnosis not present

## 2020-07-26 NOTE — Progress Notes (Signed)
Cardiology Office Note:    Date:  07/26/2020   ID:  Rachel [name], DOB Nov 25, 1985, MRN [id number]  PCP:  Charlott Rakes, MD  [facility] Cardiologist:  No primary care provider on file.  [facility] Electrophysiologist:  None   Referring MD: Bo Merino, MD  CC: palpitations Consultation for the evaluation of palpitations at the behest of Dr. Adela Glimpse.  History of Present Illness:    Rachel [name] is a 34 y.o. female with a hx of Bipolar Disorder, ADHD, myalgias, delayed gastric emptying who presents for palpitations with distant history of heart murmur.  Patient notes chronic, wide spread pain for years.  Notes that with standing or with physical labor, has feeling that her heart beats harder.  Occurs with minimal activity (getting UOB, or moving a small box).  Felt breathlessness, short of breath. At the grocery store, can finish three-fourths of a trip to the grocery store.  Mother is a Marine scientist; when she checks her pulse.  Also notes that her shortness of breath occurs not with only minimal activity.  No syncope but often feels pre-syncope.  Patient notes that all of her health issues worsened at the beginning of 2019.    Past Medical History:  Diagnosis Date  . ADHD (attention deficit hyperactivity disorder)   . Anemia   . Anxiety   . Asthma   . Bipolar disorder ([place])   . Delayed gastric emptying 02/23/2020  . Depression   . DUB (dysfunctional uterine bleeding) 02/23/2020  . GERD (gastroesophageal reflux disease)   . Hiatal hernia 04/06/2020  . Myalgia 04/06/2020  . Parasitic infection 02/23/2020  . Poor appetite 02/23/2020  . RUQ pain 02/23/2020  . Vitamin D deficiency   . Weight loss 02/23/2020    Past Surgical History:  Procedure Laterality Date  . barium study    . COLONOSCOPY    . ESOPHAGOGASTRODUODENOSCOPY  2011   and a ph study as well.   [name] Kitchen NASAL SEPTUM SURGERY    . TONGUE FLAP    . WISDOM TOOTH EXTRACTION      Current  Medications: Current Meds  Medication Sig  . acetaminophen (TYLENOL) 500 MG tablet Take 1,000 mg by mouth every 6 (six) hours as needed for headache (pain).  [name] Kitchen albuterol (VENTOLIN HFA) 108 (90 Base) MCG/ACT inhaler Inhale 1-2 puffs into the lungs every 6 (six) hours as needed for wheezing or shortness of breath.  . cholecalciferol (VITAMIN D3) 25 MCG (1000 UNIT) tablet Take 1,000 Units by mouth daily.  . fexofenadine (ALLEGRA) 180 MG tablet Take 180 mg by mouth daily.  [name] Kitchen lactulose (CHRONULAC) 10 GM/15ML solution Take 15 mLs (10 g total) by mouth 3 (three) times daily as needed for mild constipation.  [name] Kitchen linaclotide (LINZESS) 290 MCG CAPS capsule Take 1 capsule (290 mcg total) by mouth daily before breakfast.  . MELATONIN PO Take by mouth at bedtime.  . Multiple Vitamin (MULTIVITAMIN WITH MINERALS) TABS tablet Take 1 tablet by mouth at bedtime.  . naproxen sodium (ALEVE) 220 MG tablet Take 220 mg by mouth as needed.   . polyethylene glycol (MIRALAX / GLYCOLAX) 17 g packet Take 17 g by mouth daily. 1 capful daily  . triamcinolone cream (KENALOG) 0.1 % Apply 1 application topically 2 (two) times daily.     Allergies:   Amphetamine-dextroamphetamine, Penicillins, Doxycycline, Olanzapine, Other, Prozac [fluoxetine hcl], and Reglan [metoclopramide]   Social History   Socioeconomic History  . Marital status: Divorced    Spouse name: Not on file  .  Number of children: Not on file  . Years of education: Not on file  . Highest education level: Not on file  Occupational History  . Not on file  Tobacco Use  . Smoking status: Never Smoker  . Smokeless tobacco: Never Used  Vaping Use  . Vaping Use: Never used  Substance and Sexual Activity  . Alcohol use: No  . Drug use: No  . Sexual activity: Not Currently  Other Topics Concern  . Not on file  Social History Narrative  . Not on file   Social Determinants of Health   Financial Resource Strain:   . Difficulty of Paying Living Expenses:  Not on file  Food Insecurity:   . Worried About Charity fundraiser in the Last Year: Not on file  . Ran Out of Food in the Last Year: Not on file  Transportation Needs:   . Lack of Transportation (Medical): Not on file  . Lack of Transportation (Non-Medical): Not on file  Physical Activity:   . Days of Exercise per Week: Not on file  . Minutes of Exercise per Session: Not on file  Stress:   . Feeling of Stress : Not on file  Social Connections:   . Frequency of Communication with Friends and Family: Not on file  . Frequency of Social Gatherings with Friends and Family: Not on file  . Attends Religious Services: Not on file  . Active Member of Clubs or Organizations: Not on file  . Attends Archivist Meetings: Not on file  . Marital Status: Not on file     Family History: The patient's family history includes ADD / ADHD in her sister; Anxiety disorder in her mother and sister; Asthma in her mother and sister; Bipolar disorder in her father, sister, and sister; Colon cancer in her maternal grandmother; Depression in her father and mother; Endometriosis in her maternal grandmother, mother, and sister; Fibroids in her maternal grandmother, mother, and sister; Fibromyalgia in her mother; Hyperlipidemia in her father and mother; Hypertension in her father and mother; Seizures in her sister. No arrhythmia in family.  Possible history of connective tissue disease (possible mother).  No sudden cardac death, drownings or accidents.  ROS:   Please see the history of present illness.    Notes weight loss. All other systems reviewed and are negative.  EKGs/Labs/Other Studies Reviewed:    The following studies were reviewed today:  EKG:  EKG is ordered today.  The ekg ordered today demonstrates sinus rhythm normal axis, heart rate of 70; WNL  Recent Labs: 01/16/2020: ALT 12 02/04/2020: BUN 14; Creatinine, Ser 0.72; Potassium 4.5; Sodium 142; TSH 1.760 06/28/2020: Hemoglobin 13.0;  Platelets 310  Recent Lipid Panel No results found for: CHOL, TRIG, HDL, CHOLHDL, VLDL, LDLCALC, LDLDIRECT  Physical Exam:    VS:  BP 110/60 (BP Location: Right Arm, Patient Position: Sitting, Cuff Size: Normal)   Ht 5\' 2"  (1.575 m)   Wt 100 lb 3.2 oz (45.5 kg)   SpO2 96%   BMI 18.33 kg/m     Wt Readings from Last 3 Encounters:  07/26/20 100 lb 3.2 oz (45.5 kg)  07/14/20 101 lb 12.8 oz (46.2 kg)  07/06/20 99 lb 12.8 oz (45.3 kg)     GEN: Well nourished, well developed in no acute distress HEENT: Normal NECK: No JVD; No carotid bruits LYMPHATICS: No lymphadenopathy CARDIAC: RRR, no murmurs, rubs, gallops RESPIRATORY:  Clear to auscultation without rales, wheezing or rhonchi  ABDOMEN: Soft, non-tender, non-distended  MUSCULOSKELETAL:  No edema; No deformity ; hypermobility joints predominantly in the hands SKIN: Warm and dry NEUROLOGIC:  Alert and oriented x 3; note and allodynia on palpitations PSYCHIATRIC:  Concerned mood and nervous affect  ASSESSMENT:    1. Palpitations    [name]:    In order of problems listed above:  1. Palpitations in the setting of anemia, +/- bipolar  2. Family history of connective tissue disease NOS - will get ZioPatch and Echo to rule out structural heart disease and to evaluation heart rhythm during these events.  At second visit if otherwise normal; will consider CPET.  See in 1.5 2 months after testing.  Medication Adjustments/Labs and Tests Ordered: Current medicines are reviewed at length with the patient today.  Concerns regarding medicines are outlined above.  No orders of the defined types were placed in this encounter.  No orders of the defined types were placed in this encounter.   There are no Patient Instructions on file for this visit.   Signed, Werner Lean, MD  07/26/2020 9:15 AM    [place]

## 2020-07-26 NOTE — Patient Instructions (Addendum)
  Medication Instructions:  Your physician recommends that you continue on your current medications as directed. Please refer to the Current Medication list given to you today.  *If you need a refill on your cardiac medications before your next appointment, please call your pharmacy*   Lab Work: None ordered today If you have labs (blood work) drawn today and your tests are completely normal, you will receive your results only by: [name] Kitchen MyChart Message (if you have MyChart) OR . A paper copy in the mail If you have any lab test that is abnormal or we need to change your treatment, we will call you to review the results.   Testing/Procedures: Your physician has requested that you have an echocardiogram. Echocardiography is a painless test that uses sound waves to create images of your heart. It provides your doctor with information about the size and shape of your heart and how well your heart's chambers and valves are working. This procedure takes approximately one hour. There are no restrictions for this procedure.  Your physician has recommended that you wear a Zio Patch. Zio Patch are medical devices that record the heart's electrical activity. Doctors most often use these monitors to diagnose arrhythmias. Arrhythmias are problems with the speed or rhythm of the heartbeat. The monitor is a small, portable device. You can wear one while you do your normal daily activities. This is usually used to diagnose what is causing palpitations/syncope (passing out).       Follow-Up: At [facility], you and your health needs are our priority.  As part of our continuing mission to provide you with exceptional heart care, we have created designated Provider Care Teams.  These Care Teams include your primary Cardiologist (physician) and Advanced Practice Providers (APPs -  Physician Assistants and Nurse Practitioners) who all work together to provide you with the care you need, when you need it.  We  recommend signing up for the patient portal called "MyChart".  Sign up information is provided on this After Visit Summary.  MyChart is used to connect with patients for Virtual Visits (Telemedicine).  Patients are able to view lab/test results, encounter notes, upcoming appointments, etc.  Non-urgent messages can be sent to your provider as well.   To learn more about what you can do with MyChart, go to [email or web address].    Your next appointment:   1 month(s) to 2 months after Echo   The format for your next appointment:   In Person  Provider:   Gasper Sells   Other Instructions

## 2020-07-27 ENCOUNTER — Telehealth: Payer: Self-pay | Admitting: Family Medicine

## 2020-07-27 NOTE — Telephone Encounter (Signed)
I would recommend she try an Enema and give her GI doctor a call.

## 2020-07-27 NOTE — Telephone Encounter (Signed)
Will route to PCP for review. 

## 2020-07-27 NOTE — Telephone Encounter (Signed)
lactulose (CHRONULAC) 10 GM/15ML solution Medication Date: 07/14/2020 Department: [place] Ordering/Authorizing: Charlott Rakes, MD   Pt med seems to be not working at all! She also states no BM in about 7 days since starting this med. Pt needs a nurse fu call at [phone]

## 2020-07-27 NOTE — Telephone Encounter (Signed)
Patient was called and informed to use a Enema, she states that she was referred to a specialist in [facility]) by her GI doctor that you referred her to. She states that she has reached out to [facility] office and they informed her that they had no appointment at this time. She says that she will try the Enema and if it does not work she will give [place] a call back.

## 2020-07-28 ENCOUNTER — Other Ambulatory Visit (INDEPENDENT_AMBULATORY_CARE_PROVIDER_SITE_OTHER): Payer: Medicaid Other

## 2020-07-28 DIAGNOSIS — R002 Palpitations: Secondary | ICD-10-CM

## 2020-07-28 NOTE — Progress Notes (Signed)
ekg 

## 2020-07-29 ENCOUNTER — Other Ambulatory Visit: Payer: Self-pay

## 2020-07-29 ENCOUNTER — Ambulatory Visit: Payer: Medicaid Other | Admitting: Rheumatology

## 2020-07-29 ENCOUNTER — Encounter: Payer: Self-pay | Admitting: Rheumatology

## 2020-07-29 VITALS — BP 118/81 | HR 78 | Resp 14 | Ht 62.0 in | Wt 101.4 lb

## 2020-07-29 DIAGNOSIS — R5383 Other fatigue: Secondary | ICD-10-CM

## 2020-07-29 DIAGNOSIS — Z8669 Personal history of other diseases of the nervous system and sense organs: Secondary | ICD-10-CM

## 2020-07-29 DIAGNOSIS — M25512 Pain in left shoulder: Secondary | ICD-10-CM | POA: Diagnosis not present

## 2020-07-29 DIAGNOSIS — R768 Other specified abnormal immunological findings in serum: Secondary | ICD-10-CM

## 2020-07-29 DIAGNOSIS — Z8719 Personal history of other diseases of the digestive system: Secondary | ICD-10-CM

## 2020-07-29 DIAGNOSIS — N938 Other specified abnormal uterine and vaginal bleeding: Secondary | ICD-10-CM

## 2020-07-29 DIAGNOSIS — Z8659 Personal history of other mental and behavioral disorders: Secondary | ICD-10-CM

## 2020-07-29 DIAGNOSIS — L308 Other specified dermatitis: Secondary | ICD-10-CM

## 2020-07-29 DIAGNOSIS — G8929 Other chronic pain: Secondary | ICD-10-CM

## 2020-07-29 DIAGNOSIS — Z8709 Personal history of other diseases of the respiratory system: Secondary | ICD-10-CM | POA: Diagnosis not present

## 2020-07-29 DIAGNOSIS — Z8269 Family history of other diseases of the musculoskeletal system and connective tissue: Secondary | ICD-10-CM

## 2020-07-29 DIAGNOSIS — K3184 Gastroparesis: Secondary | ICD-10-CM

## 2020-07-29 DIAGNOSIS — M7918 Myalgia, other site: Secondary | ICD-10-CM | POA: Diagnosis not present

## 2020-07-29 DIAGNOSIS — M2550 Pain in unspecified joint: Secondary | ICD-10-CM | POA: Diagnosis not present

## 2020-07-29 DIAGNOSIS — Z8639 Personal history of other endocrine, nutritional and metabolic disease: Secondary | ICD-10-CM | POA: Diagnosis not present

## 2020-07-29 DIAGNOSIS — F5101 Primary insomnia: Secondary | ICD-10-CM | POA: Diagnosis not present

## 2020-07-29 DIAGNOSIS — R002 Palpitations: Secondary | ICD-10-CM | POA: Diagnosis not present

## 2020-08-05 ENCOUNTER — Encounter: Payer: Self-pay | Admitting: Family Medicine

## 2020-08-05 ENCOUNTER — Ambulatory Visit (INDEPENDENT_AMBULATORY_CARE_PROVIDER_SITE_OTHER): Payer: Medicaid Other | Admitting: Family Medicine

## 2020-08-05 ENCOUNTER — Other Ambulatory Visit: Payer: Self-pay

## 2020-08-05 VITALS — BP 118/88 | HR 73 | Ht 62.0 in | Wt 101.0 lb

## 2020-08-05 DIAGNOSIS — M2550 Pain in unspecified joint: Secondary | ICD-10-CM | POA: Diagnosis not present

## 2020-08-05 DIAGNOSIS — M359 Systemic involvement of connective tissue, unspecified: Secondary | ICD-10-CM

## 2020-08-05 NOTE — Progress Notes (Signed)
[place] Phone: [phone] Subjective:   [name], am serving as a scribe for Dr. Hulan Saas. This visit occurred during the SARS-CoV-2 public health emergency.  Safety protocols were in place, including screening questions prior to the visit, additional usage of staff PPE, and extensive cleaning of exam room while observing appropriate contact time as indicated for disinfecting solutions.  I'm seeing this patient by the request  of:  Charlott Rakes, MD  CC: Multiple complaints  [id number]  Rachel [name] is a 34 y.o. female coming in with complaint of hypermobility and polyarthralgia. Patient states that she may have Ehlers-danlos syndrome. Has had pain and burning sensations throughout her body. Also having joint and muscle pain. Has gastroparesis issues. Blends protein shakes and pureed foods in attempts to get nutrition throughout her day.   Using Holter monitor currently due to being short of breath. Cannot move around more than 10 minutes without SOB and muscle fatigue. Symptoms worsening the older she gets. Patient also runs a low grade fever.    Patient's mother was on FaceTime for conversation.     Reviewed patient's labs patient initially had a positive ANA back in August 2021 with a 1-80 titer, low vitamin D of 19 and patient has had a plethora of other labs that have been fairly unremarkable.  Patient has had some imaging of the CT abdomen and pelvis done recently showing no significant findings to explain the pain but did have an upper GI that did show maybe some mild gastroparesis Reviewing patient's chart has had multiple different musculoskeletal and neurologic complaints over the course of time.  Nerve conduction study in 2015 was unremarkable. Past Medical History:  Diagnosis Date   ADHD (attention deficit hyperactivity disorder)    Anemia    Anxiety    Asthma    Bipolar  disorder ([place])    Delayed gastric emptying 02/23/2020   Depression    DUB (dysfunctional uterine bleeding) 02/23/2020   GERD (gastroesophageal reflux disease)    Hiatal hernia 04/06/2020   Myalgia 04/06/2020   Parasitic infection 02/23/2020   Poor appetite 02/23/2020   RUQ pain 02/23/2020   Vitamin D deficiency    Weight loss 02/23/2020   Past Surgical History:  Procedure Laterality Date   barium study     COLONOSCOPY     ESOPHAGOGASTRODUODENOSCOPY  2011   and a ph study as well.    NASAL SEPTUM SURGERY     TONGUE FLAP     WISDOM TOOTH EXTRACTION     Social History   Socioeconomic History   Marital status: Divorced    Spouse name: Not on file   Number of children: Not on file   Years of education: Not on file   Highest education level: Not on file  Occupational History   Not on file  Tobacco Use   Smoking status: Never Smoker   Smokeless tobacco: Never Used  Vaping Use   Vaping Use: Never used  Substance and Sexual Activity   Alcohol use: No   Drug use: No   Sexual activity: Not Currently  Other Topics Concern   Not on file  Social History Narrative   Not on file   Social Determinants of Health   Financial Resource Strain:    Difficulty of Paying Living Expenses: Not on file  Food Insecurity:    Worried About [place] in the Last Year: Not on file  Ran Out of Food in the Last Year: Not on file  Transportation Needs:    Lack of Transportation (Medical): Not on file   Lack of Transportation (Non-Medical): Not on file  Physical Activity:    Days of Exercise per Week: Not on file   Minutes of Exercise per Session: Not on file  Stress:    Feeling of Stress : Not on file  Social Connections:    Frequency of Communication with Friends and Family: Not on file   Frequency of Social Gatherings with Friends and Family: Not on file   Attends Religious Services: Not on file   Active Member of Clubs or Organizations:  Not on file   Attends Archivist Meetings: Not on file   Marital Status: Not on file   Allergies  Allergen Reactions   Amphetamine-Dextroamphetamine Other (See Comments)    Unknown reaction   Penicillins Swelling    Severe allergy per allergy test Did it involve swelling of the face/tongue/throat, SOB, or low BP? Yes Did it involve sudden or severe rash/hives, skin peeling, or any reaction on the inside of your mouth or nose? Unknown Did you need to seek medical attention at a hospital or doctor's office? Unknown When did it last happen?teenager If all above answers are "NO", may proceed with cephalosporin use.   Doxycycline Itching, Nausea And Vomiting and Other (See Comments)    Caused high fever   Olanzapine Other (See Comments)    Unknown reaction   Other     Stimulants cause unknown reaction   Prozac [Fluoxetine Hcl] Other (See Comments)    Unknown reaction   Reglan [Metoclopramide]     Causes her bipolar issues to trigger and muscle twitching and stiffness    Family History  Problem Relation Age of Onset   Depression Mother    Anxiety disorder Mother    Hypertension Mother    Hyperlipidemia Mother    Asthma Mother    Endometriosis Mother    Fibroids Mother    Fibromyalgia Mother    Migraines Mother    Depression Father    Hypertension Father    Hyperlipidemia Father    Bipolar disorder Father    Bipolar disorder Sister    ADD / ADHD Sister    Asthma Sister    Endometriosis Sister    Endometriosis Maternal Grandmother    Fibroids Maternal Grandmother    Hyperlipidemia Maternal Grandmother    Hypertension Maternal Grandmother    Diabetes Maternal Grandmother    Fibromyalgia Maternal Grandmother    Seizures Sister    Bipolar disorder Sister    Anxiety disorder Sister       Current Outpatient Medications (Respiratory):    albuterol (VENTOLIN HFA) 108 (90 Base) MCG/ACT inhaler, Inhale 1-2 puffs into the  lungs every 6 (six) hours as needed for wheezing or shortness of breath.   fexofenadine (ALLEGRA) 180 MG tablet, Take 180 mg by mouth daily.  Current Outpatient Medications (Analgesics):    acetaminophen (TYLENOL) 500 MG tablet, Take 1,000 mg by mouth every 6 (six) hours as needed for headache (pain).   naproxen sodium (ALEVE) 220 MG tablet, Take 220 mg by mouth as needed.    Current Outpatient Medications (Other):    cholecalciferol (VITAMIN D3) 25 MCG (1000 UNIT) tablet, Take 1,000 Units by mouth daily.   lactulose (CHRONULAC) 10 GM/15ML solution, Take 15 mLs (10 g total) by mouth 3 (three) times daily as needed for mild constipation.   linaclotide (LINZESS) 290 MCG  CAPS capsule, Take 1 capsule (290 mcg total) by mouth daily before breakfast.   MELATONIN PO, Take by mouth at bedtime.   Multiple Vitamin (MULTIVITAMIN WITH MINERALS) TABS tablet, Take 1 tablet by mouth at bedtime.   polyethylene glycol (MIRALAX / GLYCOLAX) 17 g packet, Take 17 g by mouth daily. 1 capful daily   triamcinolone cream (KENALOG) 0.1 %, Apply 1 application topically 2 (two) times daily.   Reviewed prior external information including notes and imaging from  primary care provider As well as notes that were available from care everywhere and other healthcare systems.  Past medical history, social, surgical and family history all reviewed in electronic medical record.  No pertanent information unless stated regarding to the chief complaint.   Review of Systems:  No headache, visual changes, nausea, vomiting, diarrhea, constipation, dizziness, abdominal pain, skin rash, fevers, chills, night sweats, weight loss, swollen lymph nodes, body aches, joint swelling, chest pain, shortness of breath, mood changes. POSITIVE muscle aches  Objective  Blood pressure 118/88, pulse 73, height 5\' 2"  (1.575 m), weight 101 lb (45.8 kg), SpO2 99 %.   General: No apparent distress alert and oriented x3 mood and affect  normal, dressed appropriately.  Patient is underweight HEENT: Pupils equal, extraocular movements intact  Respiratory: Patient's speak in full sentences and does not appear short of breath  Cardiovascular: No lower extremity edema, non tender, no erythema  Gait mild antalgic MSK: Patient has very minimal tenderness at all joints though noted today.  Patient does have significant hypermobility noted with a Beighton score of 9. Patient does have fairly easy subluxation of the patella with patient at rest.  Significant hypermobility of the upper extremities.   Impression and Recommendations:     The above documentation has been reviewed and is accurate and complete Lyndal Pulley, DO       Note: This dictation was prepared with Dragon dictation along with smaller phrase technology. Any transcriptional errors that result from this process are unintentional.

## 2020-08-05 NOTE — Assessment & Plan Note (Signed)
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  Beighton score is greater than 9.  Will concern for patient to have a connective tissue disorder.  We discussed with her that here in sports medicine we are more than happy to help figure out how to get a good routine to safely be active.  Patient as well as mother who was on the phone who would like more of a diagnosis and I did discuss with her that then genetics and connective tissue disorder clinics would be more beneficial for that with testing.  They were happy for this information and we had referred accordingly.  I encouraged her that once they felt comfortable with that and they are more than welcome to return and we will discuss multiple different ways for patient to remain active.  Patient has many other good providers at this moment.  Time with patient, talking to mother and reviewing chart greater than 55 minutes today.

## 2020-08-05 NOTE — Patient Instructions (Signed)
Referral to Genetics and Connective Tissue We will try our best but sorry for any delay-it is up to them as to when they can schedule Before any activity need their diagnosis to continue activity Nice to meet you today!

## 2020-08-06 ENCOUNTER — Other Ambulatory Visit (HOSPITAL_COMMUNITY): Payer: Medicaid Other

## 2020-08-06 ENCOUNTER — Telehealth: Payer: Self-pay | Admitting: Licensed Clinical Social Worker

## 2020-08-06 NOTE — Telephone Encounter (Signed)
Follow up call placed to patient. Patient shared that she has not followed up with initiating behavioral health services in the community due to multiple appointments with medical specialists. Pt shared that she has met with specialists that are validating the possibility of having EDS which validates reported symptoms are not "all in my head" Pt is very appreciative for the support from providers and is hopeful that her insurance carrier will cover costs for genetic testing, to confirm diagnosis of EDS.   LCSW encouraged patient to contact clinic with any additional concerns or questions, to which pt agreed. No additional concerns noted.

## 2020-08-13 ENCOUNTER — Telehealth: Payer: Self-pay | Admitting: Internal Medicine

## 2020-08-13 NOTE — Telephone Encounter (Signed)
Patient states monitor came off this morning. Patient has recorded 12 out of 14 days.   She was also questioning if monitor was capturing because she pushed the button at least once a day and the ZIO APP is only showing 2 events.  Explained to patient monitor was recording the entire time she was wearing it.  When the button is pushed that only marks the recording a symptom was felt at that time. [employer] called, spoke with [place].  She said the patient needs to enter symptom information into the APP.  It was explained the patient was not aware of this and did not feel confident in the service since her symptoms were not documented. [employer] will call the patient and coach her on how to reapply the patch for the 2 remaining days.  If patient desires a redo 14 day ZIO AT we authorize at this time, since she does not feel confident in the service provided.

## 2020-08-13 NOTE — Telephone Encounter (Signed)
  Patient is calling because her heart monitor came off early and she cannot get it back on. She also states that it did not capture everything.

## 2020-08-16 ENCOUNTER — Ambulatory Visit: Payer: Medicaid Other | Admitting: Rheumatology

## 2020-08-18 ENCOUNTER — Encounter: Payer: Self-pay | Admitting: Family Medicine

## 2020-08-18 DIAGNOSIS — K3184 Gastroparesis: Secondary | ICD-10-CM | POA: Insufficient documentation

## 2020-08-18 DIAGNOSIS — Z8719 Personal history of other diseases of the digestive system: Secondary | ICD-10-CM | POA: Diagnosis not present

## 2020-08-18 DIAGNOSIS — R634 Abnormal weight loss: Secondary | ICD-10-CM | POA: Diagnosis not present

## 2020-08-18 DIAGNOSIS — Z881 Allergy status to other antibiotic agents status: Secondary | ICD-10-CM | POA: Diagnosis not present

## 2020-08-18 DIAGNOSIS — R110 Nausea: Secondary | ICD-10-CM | POA: Diagnosis not present

## 2020-08-18 DIAGNOSIS — F909 Attention-deficit hyperactivity disorder, unspecified type: Secondary | ICD-10-CM | POA: Diagnosis not present

## 2020-08-18 DIAGNOSIS — K5900 Constipation, unspecified: Secondary | ICD-10-CM | POA: Diagnosis not present

## 2020-08-18 DIAGNOSIS — F319 Bipolar disorder, unspecified: Secondary | ICD-10-CM | POA: Diagnosis not present

## 2020-08-18 DIAGNOSIS — R140 Abdominal distension (gaseous): Secondary | ICD-10-CM | POA: Diagnosis not present

## 2020-08-18 DIAGNOSIS — Z681 Body mass index (BMI) 19 or less, adult: Secondary | ICD-10-CM | POA: Diagnosis not present

## 2020-08-18 DIAGNOSIS — Z888 Allergy status to other drugs, medicaments and biological substances status: Secondary | ICD-10-CM | POA: Diagnosis not present

## 2020-08-18 DIAGNOSIS — R1013 Epigastric pain: Secondary | ICD-10-CM | POA: Diagnosis not present

## 2020-08-18 NOTE — Telephone Encounter (Signed)
Pt was informed by [facility] that they no longer do genetic testing for connective tissue disorders, thei only recommendation was [facility]. No specific practice mentioned, just [facility] [place].

## 2020-08-19 ENCOUNTER — Other Ambulatory Visit: Payer: Self-pay | Admitting: Family Medicine

## 2020-08-19 DIAGNOSIS — N939 Abnormal uterine and vaginal bleeding, unspecified: Secondary | ICD-10-CM

## 2020-08-20 ENCOUNTER — Other Ambulatory Visit: Payer: Self-pay

## 2020-08-20 ENCOUNTER — Ambulatory Visit (HOSPITAL_COMMUNITY): Payer: Medicaid Other | Attending: Cardiology

## 2020-08-20 DIAGNOSIS — R002 Palpitations: Secondary | ICD-10-CM | POA: Insufficient documentation

## 2020-08-20 LAB — ECHOCARDIOGRAM COMPLETE
Area-P 1/2: 4.31 cm2
S' Lateral: 3 cm

## 2020-08-26 DIAGNOSIS — R634 Abnormal weight loss: Secondary | ICD-10-CM | POA: Diagnosis not present

## 2020-08-26 DIAGNOSIS — R110 Nausea: Secondary | ICD-10-CM | POA: Diagnosis not present

## 2020-09-03 ENCOUNTER — Other Ambulatory Visit: Payer: Self-pay

## 2020-09-03 ENCOUNTER — Encounter: Payer: Self-pay | Admitting: Internal Medicine

## 2020-09-03 ENCOUNTER — Ambulatory Visit (INDEPENDENT_AMBULATORY_CARE_PROVIDER_SITE_OTHER): Payer: Medicaid Other | Admitting: Internal Medicine

## 2020-09-03 VITALS — BP 98/66 | HR 75 | Ht 62.0 in | Wt 102.4 lb

## 2020-09-03 DIAGNOSIS — I472 Ventricular tachycardia: Secondary | ICD-10-CM | POA: Diagnosis not present

## 2020-09-03 DIAGNOSIS — R002 Palpitations: Secondary | ICD-10-CM

## 2020-09-03 DIAGNOSIS — I4729 Other ventricular tachycardia: Secondary | ICD-10-CM | POA: Insufficient documentation

## 2020-09-03 NOTE — Patient Instructions (Signed)
Medication Instructions:  No changes *If you need a refill on your cardiac medications before your next appointment, please call your pharmacy*   Lab Work: none If you have labs (blood work) drawn today and your tests are completely normal, you will receive your results only by: [name] Kitchen MyChart Message (if you have MyChart) OR . A paper copy in the mail If you have any lab test that is abnormal or we need to change your treatment, we will call you to review the results.   Testing/Procedures: none   Follow-Up: Your physician recommends that you schedule a follow-up appointment as needed with Dr. Gasper Sells   Other Instructions

## 2020-09-03 NOTE — Progress Notes (Signed)
Cardiology Office Note:    Date:  09/03/2020   ID:  Rachel [name], DOB September 20, 1986, MRN [id number]  PCP:  Charlott Rakes, MD  [facility] Cardiologist:  Werner Lean, MD   Referring MD: Charlott Rakes, MD   History of Present Illness:    Rachel [name] is a 34 y.o. female with a hx of bipolar disorder, ADFD, and myalgias who originally presented with palpitations 07/26/20.  Patient completed echo normal biventricular function without valve dysfunction (08/20/20) and and one 7 beat run of NSVT.  Patient notes the she had a rash and with the ZioPatch.  Patient notes that she triggered her device multiple times that did not collect.  Enjoyed the echo.  Still having daily palpitations. No chest pain or shortness of breath.  No syncope.  Past Medical History:  Diagnosis Date  . ADHD (attention deficit hyperactivity disorder)   . Anemia   . Anxiety   . Asthma   . Bipolar disorder ([place])   . Delayed gastric emptying 02/23/2020  . Depression   . DUB (dysfunctional uterine bleeding) 02/23/2020  . GERD (gastroesophageal reflux disease)   . Hiatal hernia 04/06/2020  . Myalgia 04/06/2020  . Parasitic infection 02/23/2020  . Poor appetite 02/23/2020  . RUQ pain 02/23/2020  . Vitamin D deficiency   . Weight loss 02/23/2020    Past Surgical History:  Procedure Laterality Date  . barium study    . COLONOSCOPY    . ESOPHAGOGASTRODUODENOSCOPY  2011   and a ph study as well.   [name] Kitchen NASAL SEPTUM SURGERY    . TONGUE FLAP    . WISDOM TOOTH EXTRACTION      Current Medications: Current Meds  Medication Sig  . acetaminophen (TYLENOL) 500 MG tablet Take 1,000 mg by mouth every 6 (six) hours as needed for headache (pain).  [name] Kitchen albuterol (VENTOLIN HFA) 108 (90 Base) MCG/ACT inhaler Inhale 1-2 puffs into the lungs every 6 (six) hours as needed for wheezing or shortness of breath.  . cholecalciferol (VITAMIN D3) 25 MCG (1000 UNIT) tablet Take 1,000 Units by mouth daily.  .  fexofenadine (ALLEGRA) 180 MG tablet Take 180 mg by mouth daily.  [name] Kitchen linaclotide (LINZESS) 290 MCG CAPS capsule Take 1 capsule (290 mcg total) by mouth daily before breakfast.  . MELATONIN PO Take by mouth at bedtime.  . Multiple Vitamin (MULTIVITAMIN WITH MINERALS) TABS tablet Take 1 tablet by mouth at bedtime.  . naproxen sodium (ALEVE) 220 MG tablet Take 220 mg by mouth as needed.   . polyethylene glycol (MIRALAX / GLYCOLAX) 17 g packet Take 17 g by mouth daily. 1 capful daily  . triamcinolone cream (KENALOG) 0.1 % Apply 1 application topically 2 (two) times daily.    Allergies:   Amphetamine-dextroamphetamine, Penicillins, Doxycycline, Olanzapine, Other, Prozac [fluoxetine hcl], and Reglan [metoclopramide]   Social History   Socioeconomic History  . Marital status: Divorced    Spouse name: Not on file  . Number of children: Not on file  . Years of education: Not on file  . Highest education level: Not on file  Occupational History  . Not on file  Tobacco Use  . Smoking status: Never Smoker  . Smokeless tobacco: Never Used  Vaping Use  . Vaping Use: Never used  Substance and Sexual Activity  . Alcohol use: No  . Drug use: No  . Sexual activity: Not Currently  Other Topics Concern  . Not on file  Social History Narrative  . Not on file  Social Determinants of Health   Financial Resource Strain:   . Difficulty of Paying Living Expenses: Not on file  Food Insecurity:   . Worried About Charity fundraiser in the Last Year: Not on file  . Ran Out of Food in the Last Year: Not on file  Transportation Needs:   . Lack of Transportation (Medical): Not on file  . Lack of Transportation (Non-Medical): Not on file  Physical Activity:   . Days of Exercise per Week: Not on file  . Minutes of Exercise per Session: Not on file  Stress:   . Feeling of Stress : Not on file  Social Connections:   . Frequency of Communication with Friends and Family: Not on file  . Frequency of  Social Gatherings with Friends and Family: Not on file  . Attends Religious Services: Not on file  . Active Member of Clubs or Organizations: Not on file  . Attends Archivist Meetings: Not on file  . Marital Status: Not on file    Family History: The patient's family history includes ADD / ADHD in her sister; Anxiety disorder in her mother and sister; Asthma in her mother and sister; Bipolar disorder in her father, sister, and sister; Depression in her father and mother; Diabetes in her maternal grandmother; Endometriosis in her maternal grandmother, mother, and sister; Fibroids in her maternal grandmother and mother; Fibromyalgia in her maternal grandmother and mother; Hyperlipidemia in her father, maternal grandmother, and mother; Hypertension in her father, maternal grandmother, and mother; Migraines in her mother; Seizures in her sister.  ROS:   Please see the history of present illness.    Notes persistent chronic pain; notes nausea. All other systems reviewed and are negative.  EKGs/Labs/Other Studies Reviewed:    The following studies were reviewed today:  EKG:   09/03/20- sinus rhythm normal axis, heart rate of 70; WNL  Recent Labs: 01/16/2020: ALT 12 02/04/2020: BUN 14; Creatinine, Ser 0.72; Potassium 4.5; Sodium 142; TSH 1.760 06/28/2020: Hemoglobin 13.0; Platelets 310   08/20/20 Echo: IMPRESSIONS   1. Left ventricular ejection fraction, by estimation, is 55 to 60%. The  left ventricle has normal function. The left ventricle has no regional  wall motion abnormalities. Left ventricular diastolic parameters were  normal.  2. Right ventricular systolic function is normal. The right ventricular  size is normal. Tricuspid regurgitation signal is inadequate for assessing  PA pressure.  3. The mitral valve is normal in structure. No evidence of mitral valve  regurgitation.  4. The aortic valve is tricuspid. Aortic valve regurgitation is not  visualized. No aortic  stenosis.  5. 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 The inferior vena cava is normal in size with greater than 50%  respiratory variability, suggesting right atrial pressure of 3 mmHg.  08/23/20 Long Term Monitor  Patient had a min HR of 44 bpm, max HR of 159 bpm, and avg HR of 71 bpm  Predominant underlying rhythm was sinus.  One episode of NSVT occurred lasting 7 beats with a max rate of 156  Isolated PACs were rare (<1.0%),  Couplets or PAC Triplets were present. Isolated VEs were rare (<1.0%),  No evidence of complete heart block.  Triggered events associated with sinus tachycardia.   No evidence of malignant arrhythmias.   Physical Exam:    VS:  BP 98/66   Pulse 75   Ht 5\' 2"  (1.575 m)   Wt 102 lb 6.4 oz (  46.4 kg)   SpO2 100%   BMI 18.73 kg/m     Wt Readings from Last 3 Encounters:  09/03/20 102 lb 6.4 oz (46.4 kg)  08/05/20 101 lb (45.8 kg)  07/29/20 101 lb 6.4 oz (46 kg)    GEN: Well nourished, well developed in no acute distress HEENT: Normal NECK: No JVD; No carotid bruits LYMPHATICS: No lymphadenopathy CARDIAC: RRR, no murmurs, rubs, gallops RESPIRATORY:  Clear to auscultation without rales, wheezing or rhonchi  ABDOMEN: Soft, non-tender, non-distended MUSCULOSKELETAL:  No edema; No deformity  SKIN: Warm and dry NEUROLOGIC:  Alert and oriented x 3 PSYCHIATRIC:  Normal affect   ASSESSMENT:    1. Palpitations    [name]:    In order of problems listed above:  Palpitations NSVT - Discusses pros- and cons of AV nodal agent therapy.  Concerns that most of her symptoms (both triggered and non) were associated with sinus rhythm and that in a young patient the risk of side effects for medications would inhibit quality of life.  Discussed with mother and patient: will defer trial of AV nodal agent   PRN follow up unless new symptoms or abnormal test results warranting change in [name]  Would be  reasonable for Virtual Follow up Would be reasonable for APP Follow up   Medication Adjustments/Labs and Tests Ordered: Current medicines are reviewed at length with the patient today.  Concerns regarding medicines are outlined above.  No orders of the defined types were placed in this encounter.  No orders of the defined types were placed in this encounter.   Patient Instructions  Medication Instructions:  No changes *If you need a refill on your cardiac medications before your next appointment, please call your pharmacy*   Lab Work: none If you have labs (blood work) drawn today and your tests are completely normal, you will receive your results only by: [name] Kitchen MyChart Message (if you have MyChart) OR . A paper copy in the mail If you have any lab test that is abnormal or we need to change your treatment, we will call you to review the results.   Testing/Procedures: none   Follow-Up: Your physician recommends that you schedule a follow-up appointment as needed with Dr. Gasper Sells   Other Instructions      Signed, Werner Lean, MD  09/03/2020 8:17 AM    [place]

## 2020-09-11 ENCOUNTER — Ambulatory Visit: Payer: Medicaid Other | Attending: Internal Medicine

## 2020-09-11 DIAGNOSIS — Z23 Encounter for immunization: Secondary | ICD-10-CM

## 2020-09-11 NOTE — Progress Notes (Signed)
   Covid-19 Vaccination Clinic  Name:  Rachel [name]    MRN: [id number] DOB: 05-06-1986  09/11/2020  [name] was observed post Covid-19 immunization for 15 minutes without incident. She was provided with Vaccine Information Sheet and instruction to access the V-Safe system.   [name] was instructed to call 911 with any severe reactions post vaccine: [name] Kitchen Difficulty breathing  . Swelling of face and throat  . A fast heartbeat  . A bad rash all over body  . Dizziness and weakness

## 2020-09-17 DIAGNOSIS — K5900 Constipation, unspecified: Secondary | ICD-10-CM | POA: Diagnosis not present

## 2020-09-17 DIAGNOSIS — K6289 Other specified diseases of anus and rectum: Secondary | ICD-10-CM | POA: Diagnosis not present

## 2020-09-17 DIAGNOSIS — K5909 Other constipation: Secondary | ICD-10-CM | POA: Diagnosis not present

## 2020-09-24 ENCOUNTER — Other Ambulatory Visit: Payer: Self-pay | Admitting: Gastroenterology

## 2020-10-11 ENCOUNTER — Other Ambulatory Visit: Payer: Self-pay

## 2020-10-11 ENCOUNTER — Encounter: Payer: Self-pay | Admitting: Obstetrics and Gynecology

## 2020-10-11 ENCOUNTER — Ambulatory Visit (INDEPENDENT_AMBULATORY_CARE_PROVIDER_SITE_OTHER): Payer: Medicaid Other | Admitting: Obstetrics and Gynecology

## 2020-10-11 VITALS — BP 108/81 | HR 78 | Wt 102.8 lb

## 2020-10-11 DIAGNOSIS — Z975 Presence of (intrauterine) contraceptive device: Secondary | ICD-10-CM | POA: Diagnosis not present

## 2020-10-11 DIAGNOSIS — N938 Other specified abnormal uterine and vaginal bleeding: Secondary | ICD-10-CM | POA: Diagnosis not present

## 2020-10-11 DIAGNOSIS — R102 Pelvic and perineal pain: Secondary | ICD-10-CM

## 2020-10-11 DIAGNOSIS — N946 Dysmenorrhea, unspecified: Secondary | ICD-10-CM

## 2020-10-11 DIAGNOSIS — N941 Unspecified dyspareunia: Secondary | ICD-10-CM

## 2020-10-11 MED ORDER — IBUPROFEN 600 MG PO TABS: 600.0000 mg | ORAL_TABLET | Freq: Three times a day (TID) | ORAL | 0 refills | Status: AC

## 2020-10-11 MED ORDER — NORETHIN ACE-ETH ESTRAD-FE 1-20 MG-MCG(24) PO TABS: 1.0000 | ORAL_TABLET | Freq: Every day | ORAL | 11 refills | Status: DC

## 2020-10-11 NOTE — Patient Instructions (Addendum)
Leuprolide injection What is this medicine? LEUPROLIDE (loo PROE lide) is a man-made hormone. It is used to treat the symptoms of prostate cancer. This medicine may also be used to treat children with early onset of puberty. It may be used for other hormonal conditions. This medicine may be used for other purposes; ask your health care provider or pharmacist if you have questions. COMMON BRAND NAME(S): Lupron What should I tell my health care provider before I take this medicine? They need to know if you have any of these conditions:  diabetes  heart disease or previous heart attack  high blood pressure  high cholesterol  pain or difficulty passing urine  spinal cord metastasis  stroke  tobacco smoker  an unusual or allergic reaction to leuprolide, benzyl alcohol, other medicines, foods, dyes, or preservatives  pregnant or trying to get pregnant  breast-feeding How should I use this medicine? This medicine is for injection under the skin or into a muscle. You will be taught how to prepare and give this medicine. Use exactly as directed. Take your medicine at regular intervals. Do not take your medicine more often than directed. It is important that you put your used needles and syringes in a special sharps container. Do not put them in a trash can. If you do not have a sharps container, call your pharmacist or healthcare provider to get one. A special MedGuide will be given to you by the pharmacist with each prescription and refill. Be sure to read this information carefully each time. Talk to your pediatrician regarding the use of this medicine in children. While this medicine may be prescribed for children as young as 8 years for selected conditions, precautions do apply. Overdosage: If you think you have taken too much of this medicine contact a poison control center or emergency room at once. NOTE: This medicine is only for you. Do not share this medicine with others. What if  I miss a dose? If you miss a dose, take it as soon as you can. If it is almost time for your next dose, take only that dose. Do not take double or extra doses. What may interact with this medicine? Do not take this medicine with any of the following medications:  chasteberry This medicine may also interact with the following medications:  herbal or dietary supplements, like black cohosh or DHEA  female hormones, like estrogens or progestins and birth control pills, patches, rings, or injections  female hormones, like testosterone This list may not describe all possible interactions. Give your health care provider a list of all the medicines, herbs, non-prescription drugs, or dietary supplements you use. Also tell them if you smoke, drink alcohol, or use illegal drugs. Some items may interact with your medicine. What should I watch for while using this medicine? Visit your doctor or health care professional for regular checks on your progress. During the first week, your symptoms may get worse, but then will improve as you continue your treatment. You may get hot flashes, increased bone pain, increased difficulty passing urine, or an aggravation of nerve symptoms. Discuss these effects with your doctor or health care professional, some of them may improve with continued use of this medicine. Female patients may experience a menstrual cycle or spotting during the first 2 months of therapy with this medicine. If this continues, contact your doctor or health care professional. This medicine may increase blood sugar. Ask your healthcare provider if changes in diet or medicines are needed if   you have diabetes. What side effects may I notice from receiving this medicine? Side effects that you should report to your doctor or health care professional as soon as possible:  allergic reactions like skin rash, itching or hives, swelling of the face, lips, or tongue  breathing problems  chest  pain  depression or memory disorders  pain in your legs or groin  pain at site where injected  severe headache  signs and symptoms of high blood sugar such as being more thirsty or hungry or having to urinate more than normal. You may also feel very tired or have blurry vision  swelling of the feet and legs  visual changes  vomiting Side effects that usually do not require medical attention (report to your doctor or health care professional if they continue or are bothersome):  breast swelling or tenderness  decrease in sex drive or performance  diarrhea  hot flashes  loss of appetite  muscle, joint, or bone pains  nausea  redness or irritation at site where injected  skin problems or acne This list may not describe all possible side effects. Call your doctor for medical advice about side effects. You may report side effects to FDA at 1-800-FDA-1088. Where should I keep my medicine? Keep out of the reach of children. Store below 25 degrees C (77 degrees F). Do not freeze. Protect from light. Do not use if it is not clear or if there are particles present. Throw away any unused medicine after the expiration date. NOTE: This sheet is a summary. It may not cover all possible information. If you have questions about this medicine, talk to your doctor, pharmacist, or health care provider.  2020 Elsevier/Gold Standard (2018-08-29 09:52:48) Endometriosis  Endometriosis is a condition in which the tissue that lines the uterus (endometrium) grows outside of its normal location. The tissue may grow in many locations close to the uterus, but it commonly grows on the ovaries, fallopian tubes, vagina, or bowel. When the uterus sheds the endometrium every menstrual cycle, there is bleeding wherever the endometrial tissue is located. This can cause pain because blood is irritating to tissues that are not normally exposed to it. What are the causes? The cause of endometriosis is not  known. What increases the risk? You may be more likely to develop endometriosis if you:  Have a family history of endometriosis.  Have never given birth.  Started your period at age 21 or younger.  Have high levels of estrogen in your body.  Were exposed to a certain medicine (diethylstilbestrol) before you were born (in utero).  Had low birth weight.  Were born as a twin, triplet, or other multiple.  Have a BMI of less than 25. BMI is an estimate of body fat and is calculated from height and weight. What are the signs or symptoms? Often, there are no symptoms of this condition. If you do have symptoms, they may:  Vary depending on where your endometrial tissue is growing.  Occur during your menstrual period (most common) or midcycle.  Come and go, or you may go months with no symptoms at all.  Stop with menopause. Symptoms may include:  Pain in the back or abdomen.  Heavier bleeding during periods.  Pain during sex.  Painful bowel movements.  Infertility.  Pelvic pain.  Bleeding more than once a month. How is this diagnosed? This condition is diagnosed based on your symptoms and a physical exam. You may have tests, such as:  Blood tests  and urine tests. These may be done to help rule out other possible causes of your symptoms.  Ultrasound, to look for abnormal tissues.  An X-ray of the lower bowel (barium enema).  An ultrasound that is done through the vagina (transvaginally).  CT scan.  MRI.  Laparoscopy. In this procedure, a lighted, pencil-sized instrument called a laparoscope is inserted into your abdomen through an incision. The laparoscope allows your health care provider to look at the organs inside your body and check for abnormal tissue to confirm the diagnosis. If abnormal tissue is found, your health care provider may remove a small piece of tissue (biopsy) to be examined under a microscope. How is this treated? Treatment for this condition may  include:  Medicines to relieve pain, such as NSAIDs.  Hormone therapy. This involves using artificial (synthetic) hormones to reduce endometrial tissue growth. Your health care provider may recommend using a hormonal form of birth control, or other medicines.  Surgery. This may be done to remove abnormal endometrial tissue. ? In some cases, tissue may be removed using a laparoscope and a laser (laparoscopic laser treatment). ? In severe cases, surgery may be done to remove the fallopian tubes, uterus, and ovaries (hysterectomy). Follow these instructions at home:  Take over-the-counter and prescription medicines only as told by your health care provider.  Do not drive or use heavy machinery while taking prescription pain medicine.  Try to avoid activities that cause pain, including sexual activity.  Keep all follow-up visits as told by your health care provider. This is important. Contact a health care provider if:  You have pain in the area between your hip bones (pelvic area) that occurs: ? Before, during, or after your period. ? In between your period and gets worse during your period. ? During or after sex. ? With bowel movements or urination, especially during your period.  You have problems getting pregnant.  You have a fever. Get help right away if:  You have severe pain that does not get better with medicine.  You have severe nausea and vomiting, or you cannot eat without vomiting.  You have pain that affects only the lower, right side of your abdomen.  You have abdominal pain that gets worse.  You have abdominal swelling.  You have blood in your stool. This information is not intended to replace advice given to you by your health care provider. Make sure you discuss any questions you have with your health care provider. Document Revised: 10/12/2017 Document Reviewed: 04/01/2016 Elsevier Patient Education  2020 [place].  Diagnostic Laparoscopy Diagnostic  laparoscopy is a procedure to diagnose diseases in the abdomen. It might be done for a variety of reasons, such as to look for scar tissue, cancer, or a reason for abdomen (abdominal) pain. During the procedure, a thin, flexible tube that has a light and a camera on the end (laparoscope) is inserted through an incision in the abdomen. The image from the camera is shown on a monitor to help your surgeon see inside your body. Tell a health care provider about:  Any allergies you have.  All medicines you are taking, including vitamins, herbs, eye drops, creams, and over-the-counter medicines.  Any problems you or family members have had with anesthetic medicines.  Any blood disorders you have.  Any surgeries you have had.  Any medical conditions you have. What are the risks? Generally, this is a safe procedure. However, problems may occur, including:  Infection.  Bleeding.  Allergic reactions to  medicines or dyes.  Damage to abdominal structures or organs, such as the intestines, liver, stomach, or spleen. What happens before the procedure? Medicines  Ask your health care provider about: ? Changing or stopping your regular medicines. This is especially important if you are taking diabetes medicines or blood thinners. ? Taking medicines such as aspirin and ibuprofen. These medicines can thin your blood. Do not take these medicines unless your health care provider tells you to take them. ? Taking over-the-counter medicines, vitamins, herbs, and supplements.  You may be given antibiotic medicine to help prevent infection. Staying hydrated Follow instructions from your health care provider about hydration, which may include:  Up to 2 hours before the procedure - you may continue to drink clear liquids, such as water, clear fruit juice, black coffee, and plain tea. Eating and drinking restrictions Follow instructions from your health care provider about eating and drinking, which may  include:  8 hours before the procedure - stop eating heavy meals or foods such as meat, fried foods, or fatty foods.  6 hours before the procedure - stop eating light meals or foods, such as toast or cereal.  6 hours before the procedure - stop drinking milk or drinks that contain milk.  2 hours before the procedure - stop drinking clear liquids. General instructions  Ask your health care provider how your surgical site will be marked or identified.  You may be asked to shower with a germ-killing soap.  Plan to have someone take you home from the hospital or clinic.  Plan to have a responsible adult care for you for at least 24 hours after you leave the hospital or clinic. This is important. What happens during the procedure?   To lower your risk of infection: ? Your health care team will wash or sanitize their hands. ? Hair may be removed from the surgical area. ? Your skin will be washed with soap.  An IV will be inserted into one of your veins.  You will be given a medicine to make you fall asleep (general anesthetic). You may also be given a medicine to help you relax (sedative).  A breathing tube will be placed down your throat to help you breathe during the procedure.  Your abdomen will be filled with an air-like gas so it expands. This will give the surgeon more room to operate and will make your organs easier to see.  Many small incisions will be made in your abdomen.  A laparoscope and other surgical instruments will be inserted into your abdomen through the incisions.  A tissue sample may be removed from an organ for examination (biopsy). This will depend on the reason why you are having this procedure.  The laparoscope and other instruments will be removed from your abdomen.  The gas will be released.  Your incisions will be closed with stitches (sutures) and covered with a bandage (dressing).  Your breathing tube will be removed. The procedure may vary among  health care providers and hospitals. What happens after the procedure?   Your blood pressure, heart rate, breathing rate, and blood oxygen level will be monitored until the medicines you were given have worn off.  Do not drive for 24 hours if you were given a sedative during your procedure.  It is up to you to get the results of your procedure. Ask your health care provider, or the department that is doing the procedure, when your results will be ready. Summary  Diagnostic laparoscopy is  a way to look for problems in the abdomen using small incisions.  Follow instructions from your health care provider about how to prepare for the procedure.  Plan to have a responsible adult care for you for at least 24 hours after you leave the hospital or clinic. This is important. This information is not intended to replace advice given to you by your health care provider. Make sure you discuss any questions you have with your health care provider. Document Revised: 10/12/2017 Document Reviewed: 04/25/2017 Elsevier Patient Education  [place].

## 2020-10-11 NOTE — Progress Notes (Signed)
Subjective:    Patient ID: Rachel [name], female    DOB: [birth year], 34 y.o.   MRN: [id number]  HPI  34 yo G0 seen for 2-2 1/2 year history of irregular bleeding as well as multiple years of pelvic pain.  The patient received a nexplanon device from a gynecologist at another facility and was told this would fix her bleeding issue.  The patient has a strong family history of endometriosis ie her mother sister and grandmother.  Mother has already had a hysterectomy for the same diagnosis.  The patient has not had a significant trial of OCPs in the past or a diagnostic laparoscopy.  She notes dysmenorrhea consistently and dyspareunia with deep penetration.  Unfortunately, the patient has several chronic pain syndromes and is being worked up for Ehlers-Danlos syndrome. Of note, pt is currently in long distance relationship and is celibate.  She also notes that she does not [name] on pursuing childbearing in the future.    Review of Systems  Constitutional: Positive for appetite change.  HENT: Negative.   Respiratory: Negative.   Cardiovascular: Negative.   Gastrointestinal: Positive for abdominal pain. Negative for abdominal distention.  Genitourinary: Positive for dyspareunia, menstrual problem and pelvic pain.  Musculoskeletal: Positive for arthralgias and myalgias.  Psychiatric/Behavioral: Positive for decreased concentration. The patient is nervous/anxious.    Social History   Socioeconomic History  . Marital status: Divorced    Spouse name: Not on file  . Number of children: Not on file  . Years of education: Not on file  . Highest education level: Not on file  Occupational History  . Not on file  Tobacco Use  . Smoking status: Never Smoker  . Smokeless tobacco: Never Used  Vaping Use  . Vaping Use: Never used  Substance and Sexual Activity  . Alcohol use: No  . Drug use: No  . Sexual activity: Not Currently  Other Topics Concern  . Not on file  Social History Narrative   . Not on file   Social Determinants of Health   Financial Resource Strain:   . Difficulty of Paying Living Expenses: Not on file  Food Insecurity:   . Worried About Charity fundraiser in the Last Year: Not on file  . Ran Out of Food in the Last Year: Not on file  Transportation Needs:   . Lack of Transportation (Medical): Not on file  . Lack of Transportation (Non-Medical): Not on file  Physical Activity:   . Days of Exercise per Week: Not on file  . Minutes of Exercise per Session: Not on file  Stress:   . Feeling of Stress : Not on file  Social Connections:   . Frequency of Communication with Friends and Family: Not on file  . Frequency of Social Gatherings with Friends and Family: Not on file  . Attends Religious Services: Not on file  . Active Member of Clubs or Organizations: Not on file  . Attends Archivist Meetings: Not on file  . Marital Status: Not on file  Intimate Partner Violence:   . Fear of Current or Ex-Partner: Not on file  . Emotionally Abused: Not on file  . Physically Abused: Not on file  . Sexually Abused: Not on file   Past Medical History:  Diagnosis Date  . ADHD (attention deficit hyperactivity disorder)   . Anemia   . Anxiety   . Asthma   . Bipolar disorder ([place])   . Delayed gastric emptying 02/23/2020  . Depression   .  DUB (dysfunctional uterine bleeding) 02/23/2020  . GERD (gastroesophageal reflux disease)   . Hiatal hernia 04/06/2020  . Myalgia 04/06/2020  . Parasitic infection 02/23/2020  . Poor appetite 02/23/2020  . RUQ pain 02/23/2020  . Vitamin D deficiency   . Weight loss 02/23/2020   Past Surgical History:  Procedure Laterality Date  . barium study    . COLONOSCOPY    . ESOPHAGOGASTRODUODENOSCOPY  2011   and a ph study as well.   [name] Kitchen NASAL SEPTUM SURGERY    . TONGUE FLAP    . WISDOM TOOTH EXTRACTION         Objective:   Physical Exam Vitals reviewed.  Constitutional:      Appearance: Normal appearance. She is  normal weight.  HENT:     Head: Normocephalic and atraumatic.  Cardiovascular:     Rate and Rhythm: Normal rate and regular rhythm.     Heart sounds: Normal heart sounds.  Pulmonary:     Effort: Pulmonary effort is normal.     Breath sounds: Normal breath sounds.  Abdominal:     General: Abdomen is flat. There is no distension.     Palpations: Abdomen is soft. There is no mass.     Tenderness: There is abdominal tenderness.     Hernia: No hernia is present.  Genitourinary:    General: Normal vulva.     Comments: SSE/ SVE:  Cervix WNL, light uterine bleeding noted No CMT, diffuse uterine discomfort and bilateral adnexal pain R>L Musculoskeletal:        General: Normal range of motion.  Neurological:     Mental Status: She is alert and oriented to person, place, and time.  Psychiatric:        Mood and Affect: Mood normal.    Vitals:   10/11/20 1320  BP: 108/81  Pulse: 78         Assessment & [name]:   1. DUB (dysfunctional uterine bleeding) At least 3 month trial of NSAIDs and OCPs, nexplanon may need to be removed at some point.  Pt is to check with GI physician before starting NSAIDs due to gastroparesis.  2. Pelvic pain in female Trial of NSAIDs and OCP for first line therapy for pelvic pain, possible endometriosis.  If pain is not improving, next step would be diagnostic laparoscopy for evaluation of pelvis.  U/S from 3/21 was reviewed which showed normal sized uterus with no overt pathology. - ibuprofen (ADVIL) 600 MG tablet; Take 1 tablet (600 mg total) by mouth 3 (three) times daily for 5 days.  Dispense: 15 tablet; Refill: 0 - Norethindrone Acetate-Ethinyl Estrad-FE (LOESTRIN 24 FE) 1-20 MG-MCG(24) tablet; Take 1 tablet by mouth daily.  Dispense: 28 tablet; Refill: 11  3. Presence of subdermal contraceptive implant Remain in place for now, may need to be removed in the future due to potential cause of irregular menses and redundancy.    [name],  MD Faculty Attending, [facility]

## 2020-10-12 NOTE — BH Specialist Note (Addendum)
Integrated Behavioral Health via Telemedicine Visit  10/12/2020 Rachel [name] [id number]  Number of [place] visits: 1 Session Start time: 8:55  Session End time: 9:59 Total time: 75  Referring Provider: Lynnda Shields, MD Patient/Family location: Home [facility] Provider location: [facility]  All persons participating in visit: [name] and [place]   Types of Service: Individual psychotherapy  I connected with [name] and/or [place] by Telephone and verified that I am speaking with the correct person using two identifiers.    Discussed confidentiality: Yes   I discussed the limitations of telemedicine and the availability of in person appointments.  Discussed there is a possibility of technology failure and discussed alternative modes of communication if that failure occurs.  I discussed that engaging in this telemedicine visit, they consent to the provision of behavioral healthcare and the services will be billed under their insurance.  Patient and/or legal guardian expressed understanding and consented to Telemedicine visit: Yes   Presenting Concerns: Patient and/or family reports the following symptoms/concerns: Pt states her primary goal is "to stay mentally positive" in the midst of adjusting to painful physical health issues, along with managing symptoms related to traumatic experiences from childhood up to recent divorce two years ago (night terrors, flashbacks, depression, anxiety, panic attacks).Pt prefers no BH medication; has many coping strategies.   Duration of problem: Ongoing; Severity of problem: moderately severe  Patient and/or Family's Strengths/Protective Factors: Social connections, Social and Emotional competence, Concrete supports in place (healthy food, safe environments, etc.) and Sense of purpose  Goals Addressed: Patient  will: 1.  Reduce symptoms of: anxiety, depression and stress  2.  Increase knowledge and/or ability of: stress reduction  3.  Demonstrate ability to: Increase healthy adjustment to current life circumstances  Progress towards Goals: Ongoing  Interventions: Interventions utilized:  Motivational Interviewing and Psychoeducation and/or Health Education Standardized Assessments completed: PHQ9/GAD7 in past two weeks  Patient and/or Family Response: Pt agrees with treatment plan  Assessment: Patient currently experiencing Post-traumatic stress disorder and Psychosocial stress; History of both Bipolar 1 disorder and ADHD (both previously diagnosed)  Patient may benefit from psychoeducation and brief therapeutic interventions regarding coping with symptoms of depression, anxiety with panic, and life stress .  Plan: 1. Follow up with behavioral health clinician on : Three weeks 2. Behavioral recommendations:  -Continue following medical provider concerning medication (including taking melatonin at night for improved sleep) -Continue using self-coping strategies that have helped manage emotions (self-talk, relaxation breathing w sleep sounds at night) -Read educational materials regarding coping with symptoms of anxiety with panic -Consider using apps, as discussed, for additional self-care (and distraction from physical pain) 3. Referral(s): [place] (In Clinic)  I discussed the assessment and treatment plan with the patient and/or parent/guardian. They were provided an opportunity to ask questions and all were answered. They agreed with the plan and demonstrated an understanding of the instructions.   They were advised to call back or seek an in-person evaluation if the symptoms worsen or if the condition fails to improve as anticipated.  [name], LCSW   Depression screen [facility] 2/9 10/11/2020 07/14/2020 06/28/2020 05/26/2020 02/23/2020  Decreased Interest 1  1 1  0 0  Down, Depressed, Hopeless 0 0 1 0 0  PHQ - 2 Score 1 1 2  0 0  Altered sleeping 3 3 3 3  -  Tired, decreased energy 3 3 3 3  -  Change  in appetite 3 3 3 3  -  Feeling bad or failure about yourself  0 0 0 0 -  Trouble concentrating 2 2 2 3  -  Moving slowly or fidgety/restless 0 0 0 0 -  Suicidal thoughts 0 0 0 0 -  PHQ-9 Score 12 12 13 12  -   GAD 7 : Generalized Anxiety Score 10/11/2020 07/14/2020 06/28/2020 05/26/2020  Nervous, Anxious, on Edge 1 2 2 1   Control/stop worrying 1 2 1 1   Worry too much - different things 1 2 1  0  Trouble relaxing 2 1 2 2   Restless 0 0 0 0  Easily annoyed or irritable 0 1 0 2  Afraid - awful might happen 0 0 0 0  Total GAD 7 Score 5 8 6  6

## 2020-10-14 ENCOUNTER — Telehealth: Payer: Self-pay

## 2020-10-14 NOTE — Telephone Encounter (Signed)
Pt is requesting refill on linzess, states that she is having trouble with GI refilling her meds.

## 2020-10-15 ENCOUNTER — Telehealth: Payer: Self-pay | Admitting: Clinical

## 2020-10-15 NOTE — Telephone Encounter (Signed)
Called pt with a reminder of her upcoming [place] appintment. Pt stated understanding of the appointment and its virtual nature.   Rachel [name]

## 2020-10-15 NOTE — Telephone Encounter (Addendum)
It looks like on 09/24/2020, 30 tablets of Linzess were sent with 3 refills to [facility] on [address]. she needs to check with her pharmacy but the pharmacy does not have them, you can go ahead and send a refill for her.Thanks.

## 2020-10-18 ENCOUNTER — Other Ambulatory Visit: Payer: Self-pay

## 2020-10-18 ENCOUNTER — Ambulatory Visit: Payer: Medicaid Other | Attending: Family Medicine | Admitting: Family Medicine

## 2020-10-18 DIAGNOSIS — J4520 Mild intermittent asthma, uncomplicated: Secondary | ICD-10-CM | POA: Diagnosis not present

## 2020-10-18 DIAGNOSIS — M791 Myalgia, unspecified site: Secondary | ICD-10-CM | POA: Diagnosis not present

## 2020-10-18 DIAGNOSIS — L309 Dermatitis, unspecified: Secondary | ICD-10-CM | POA: Diagnosis not present

## 2020-10-18 DIAGNOSIS — N939 Abnormal uterine and vaginal bleeding, unspecified: Secondary | ICD-10-CM

## 2020-10-18 DIAGNOSIS — K3184 Gastroparesis: Secondary | ICD-10-CM

## 2020-10-18 MED ORDER — ALBUTEROL SULFATE HFA 108 (90 BASE) MCG/ACT IN AERS: 1.0000 | INHALATION_SPRAY | Freq: Four times a day (QID) | RESPIRATORY_TRACT | 6 refills | Status: DC | PRN

## 2020-10-18 MED ORDER — TRIAMCINOLONE ACETONIDE 0.1 % EX CREA: 1.0000 "application " | TOPICAL_CREAM | Freq: Two times a day (BID) | CUTANEOUS | 1 refills | Status: DC

## 2020-10-18 NOTE — Progress Notes (Signed)
Virtual Visit via Telephone Note  I connected with Rachel [name], on 10/18/2020 at 8:35 AM by telephone due to the COVID-19 pandemic and verified that I am speaking with the correct person using two identifiers.   Consent: I discussed the limitations, risks, security and privacy concerns of performing an evaluation and management service by telephone and the availability of in person appointments. I also discussed with the patient that there may be a patient responsible charge related to this service. The patient expressed understanding and agreed to proceed.   Location of Patient: Home  Location of Provider: Clinic   Persons participating in Telemedicine visit: Rachel [name]-CMA Dr. Margarita Rana     History of Present Illness: Rachel [name] is a 34 year old femalepatient of Dr Conni Slipper a history of bipolar disorder, anxiety, depression, GERD, chronic abdominal pain seen today for a follow-up visit. 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  She was referred to a gastroparesis specialist at [facility].  She underwent gastric emptying study which revealed normal 2 and 4-hour gastric retention it appeared she was also prescribed Prucalopride but it seems a prior authorization was required and the patient complains of difficulty contacting the GI specialist or even obtaining this medication. Her next appointment with GI comes up in 11/2020   She saw Rheumatology and was thought to have Fibromyalgia but diagnosed with Myofacial pain syndrome and was also referred to Sports Med due to suspicion for Ehler Danlos syndrome and is undergoing genetic testing.  Until then she has been advised not to take any additional medications for pain Her joints and muscles hurt and she has pins and needles. She is currently using naproxen as needed.  She is seeing OBGYN and was thought to have  Endometriosis.  Placed on NSAIDs and OCPs. She is seeing a mental health therapist and not seeing a Psychiatrist. She states antidepressants make her worse and she does not do well on mood stabilizers. Requests refill of her MDI for her asthma.  Past Medical History:  Diagnosis Date  . ADHD (attention deficit hyperactivity disorder)   . Anemia   . Anxiety   . Asthma   . Bipolar disorder ([place])   . Delayed gastric emptying 02/23/2020  . Depression   . DUB (dysfunctional uterine bleeding) 02/23/2020  . GERD (gastroesophageal reflux disease)   . Hiatal hernia 04/06/2020  . Myalgia 04/06/2020  . Parasitic infection 02/23/2020  . Poor appetite 02/23/2020  . RUQ pain 02/23/2020  . Vitamin D deficiency   . Weight loss 02/23/2020   Allergies  Allergen Reactions  . Amphetamine-Dextroamphetamine Other (See Comments)    Unknown reaction  . Penicillins Swelling    Severe allergy per allergy test Did it involve swelling of the face/tongue/throat, SOB, or low BP? Yes Did it involve sudden or severe rash/hives, skin peeling, or any reaction on the inside of your mouth or nose? Unknown Did you need to seek medical attention at a hospital or doctor's office? Unknown When did it last happen?teenager If all above answers are "NO", may proceed with cephalosporin use.  [name] Kitchen Doxycycline Itching, Nausea And Vomiting and Other (See Comments)    Caused high fever  . Olanzapine Other (See Comments)    Unknown reaction  . Other     Stimulants cause unknown reaction  . Prozac [Fluoxetine Hcl] Other (See Comments)    Unknown reaction  . Reglan [Metoclopramide]     Causes her bipolar issues to  trigger and muscle twitching and stiffness     Current Outpatient Medications on File Prior to Visit  Medication Sig Dispense Refill  . albuterol (VENTOLIN HFA) 108 (90 Base) MCG/ACT inhaler Inhale 1-2 puffs into the lungs every 6 (six) hours as needed for wheezing or shortness of breath. 8 g 0  . cholecalciferol  (VITAMIN D3) 25 MCG (1000 UNIT) tablet Take 1,000 Units by mouth daily.    . fexofenadine (ALLEGRA) 180 MG tablet Take 180 mg by mouth daily.    [name] Kitchen LINZESS 290 MCG CAPS capsule TAKE ONE CAPSULE BY MOUTH DAILY BEFORE BREAKFAST 30 capsule 3  . MELATONIN PO Take by mouth at bedtime.    . Multiple Vitamin (MULTIVITAMIN WITH MINERALS) TABS tablet Take 1 tablet by mouth at bedtime.    . naproxen sodium (ALEVE) 220 MG tablet Take 220 mg by mouth as needed.     . Norethindrone Acetate-Ethinyl Estrad-FE (LOESTRIN 24 FE) 1-20 MG-MCG(24) tablet Take 1 tablet by mouth daily. 28 tablet 11  . polyethylene glycol (MIRALAX / GLYCOLAX) 17 g packet Take 17 g by mouth daily. 1 capful daily    . triamcinolone cream (KENALOG) 0.1 % Apply 1 application topically 2 (two) times daily. 45 g 1  . acetaminophen (TYLENOL) 500 MG tablet Take 1,000 mg by mouth every 6 (six) hours as needed for headache (pain).     No current facility-administered medications on file prior to visit.    Observations/Objective: Awake, alert, active x3 Not in acute distress  Assessment and [name]: 1. Dermatitis Improved - triamcinolone (KENALOG) 0.1 %; Apply 1 application topically 2 (two) times daily.  Dispense: 45 g; Refill: 1  2. Myalgia Currently undergoing genetic testing to exclude Ehlers-Danlos syndrome - Ambulatory referral to Physical Therapy  3. Abnormal uterine bleeding Currently on OCP and NSAIDs Followed by GYN  4. Gastroparesis Uncontrolled Currently being worked up by [place]  5. Mild intermittent asthma without complication Stable - albuterol (VENTOLIN HFA) 108 (90 Base) MCG/ACT inhaler; Inhale 1-2 puffs into the lungs every 6 (six) hours as needed for wheezing or shortness of breath.  Dispense: 8 g; Refill: 6   Follow Up Instructions: 3 months    I discussed the assessment and treatment [name] with the patient. The patient was provided an opportunity to ask questions and all were answered. The patient  agreed with the [name] and demonstrated an understanding of the instructions.   The patient was advised to call back or seek an in-person evaluation if the symptoms worsen or if the condition fails to improve as anticipated.     I provided 22 minutes total of non-face-to-face time during this encounter including median intraservice time, reviewing previous notes, investigations, ordering medications, medical decision making, coordinating care and patient verbalized understanding at the end of the visit.     Charlott Rakes, MD, FAAFP. [facility] [place], [place]   10/18/2020, 8:35 AM

## 2020-10-18 NOTE — Telephone Encounter (Signed)
Pt discussed medication with PCP on telephone visit

## 2020-10-18 NOTE — Progress Notes (Signed)
Has est with rheumatology.  Has est with OBGYN.  States that she has pain over entire body.

## 2020-10-22 ENCOUNTER — Ambulatory Visit: Payer: Medicaid Other | Admitting: Family Medicine

## 2020-10-22 ENCOUNTER — Ambulatory Visit (INDEPENDENT_AMBULATORY_CARE_PROVIDER_SITE_OTHER): Payer: Medicaid Other | Admitting: Clinical

## 2020-10-22 DIAGNOSIS — F4310 Post-traumatic stress disorder, unspecified: Secondary | ICD-10-CM

## 2020-10-22 DIAGNOSIS — F319 Bipolar disorder, unspecified: Secondary | ICD-10-CM

## 2020-10-22 DIAGNOSIS — Z658 Other specified problems related to psychosocial circumstances: Secondary | ICD-10-CM

## 2020-10-22 DIAGNOSIS — F909 Attention-deficit hyperactivity disorder, unspecified type: Secondary | ICD-10-CM

## 2020-10-22 NOTE — Patient Instructions (Signed)
[facility] [address] [place], [place] [address] [phone] (main office) [phone] (Corryn Madewell's office)  Coping with Panic Attacks   What is a panic attack?  You may have had a panic attack if you experienced four or more of the symptoms listed below coming on abruptly and peaking in about 10 minutes.  Panic Symptoms   . Pounding heart  . Sweating  . Trembling or shaking  . Shortness of breath  . Feeling of choking  . Chest pain  . Nausea or abdominal distress    . Feeling dizzy, unsteady, lightheaded, or faint  . Feelings of unreality or being detached from yourself  . Fear of losing control or going crazy  . Fear of dying  . Numbness or tingling  . Chills or hot flashes      Panic attacks are sometimes accompanied by avoidance of certain places or situations. These are often situations that would be difficult to escape from or in which help might not be available. Examples might include crowded shopping malls, public transportation, restaurants, or driving.   Why do panic attacks occur?   Panic attacks are the body's alarm system gone awry. All of us have a built-in alarm system, powered by adrenaline, which increases our heart rate, breathing, and blood flow in response to danger. Ordinarily, this 'danger response system' works well. In some people, however, the response is either out of proportion to whatever stress is going on, or may come out of the blue without any stress at all.   For example, if you are walking in the woods and see a bear coming your way, a variety of changes occur in your body to prepare you to either fight the danger or flee from the situation. Your heart rate will increase to get more blood flow around your body, your breathing rate will quicken so that more oxygen is available, and your muscles will tighten in order to be ready to fight or run. You may feel nauseated as blood flow leaves your  stomach area and moves into your limbs. These bodily changes are all essential to helping you survive the dangerous situation. After the danger has passed, your body functions will begin to go back to normal. This is because your body also has a system for "recovering" by bringing your body back down to a normal state when the danger is over.   As you can see, the emergency response system is adaptive when there is, in fact, a "true" or "real" danger (e.g., bear). However, sometimes people find that their emergency response system is triggered in "everyday" situations where there really is no true physical danger (e.g., in a meeting, in the grocery store, while driving in normal traffic, etc.).   What triggers a panic attack?  Sometimes particularly stressful situations can trigger a panic attack. For example, an argument with your spouse or stressors at work can cause a stress response (activating the emergency response system) because you perceive it as threatening or overwhelming, even if there is no direct risk to your survival.  Sometimes panic attacks don't seem to be triggered by anything in particular- they may "come out of the blue". Somehow, the natural "fight or flight" emergency response system has gotten activated when there is no real danger. Why does the body go into "emergency mode" when there is no real danger?   Often, people with panic attacks are frightened or alarmed by the physical sensations of   the emergency response system. First, unexpected physical sensations are experienced (tightness in your chest or some shortness of breath). This then leads to feeling fearful or alarmed by these symptoms ("Something's wrong!", "Am I having a heart attack?", "Am I going to faint?") The mind perceives that there is a danger even though no real danger exists. This, in turn, activates the emergency response system ("fight or flight"), leading to a "full blown" panic attack. In summary, panic  attacks occur when we misinterpret physical symptoms as signs of impending death, craziness, loss of control, embarrassment, or fear of fear. Sometimes you may be aware of thoughts of danger that activate the emergency response system (for example, thinking "I'm having a heart attack" when you feel chest pressure or increased heart rate). At other times, however, you may not be aware of such thoughts. After several incidences of being afraid of physical sensations, anxiety and panic can occur in response to the initial sensations without conscious thoughts of danger. Instead, you just feel afraid or alarmed. In other words, the panic or fear may seem to occur "automatically" without you consciously telling yourself anything.   After having had one or more panic attacks, you may also become more focused on what is going on inside your body. You may scan your body and be more vigilant about noticing any symptoms that might signal the start of a panic attack. This makes it easier for panic attacks to happen again because you pick up on sensations you might otherwise not have noticed, and misinterpret them as something dangerous. A panic attack may then result.      How do I cope with panic attacks?  An important part of overcoming panic attacks involves re-interpreting your body's physical reactions and teaching yourself ways to decrease the physical arousal. This can be done through practicing the cognitive and behavioral interventions below.   Research has found that over half of people who have panic attacks show some signs of hyperventilation or overbreathing. This can produce initial sensations that alarm you and lead to a panic attack. Overbreathing can also develop as part of the panic attack and make the symptoms worse. When people hyperventilate, certain blood vessels in the body become narrower. In particular, the brain may get slightly less oxygen. This can lead to the symptoms of dizziness,  confusion, and lightheadedness that often occur during panic attacks. Other parts of the body may also get a bit less oxygen, which may lead to numbness or tingling in the hands or feet or the sensation of cold, clammy hands. It also may lead the heart to pump harder. Although these symptoms may be frightening and feel unpleasant, it is important to remember that hyperventilating is not dangerous. However, you can help overcome the unpleasantness of overbreathing by practicing Breathing Retraining.   Practice this basic technique three times a day, every day:  . Inhale. With your shoulders relaxed, inhale as slowly and deeply as you can while you count to six. If you can, use your diaphragm to fill your lungs with air.  . Hold. Keep the air in your lungs as you slowly count to four.  . Exhale. Slowly breath out as you count to six.  . Repeat. Do the inhale-hold-exhale cycle several times. Each time you do it, exhale for longer counts.  Like any new skill, Breathing Retraining requires practice. Try practicing this skill twice a day for several minutes. Initially, do not try this technique in specific situations or when you   become frightened or have a panic attack. Begin by practicing in a quiet environment to build up your skill level so that you can later use it in time of "emergency."   2. Decreasing Avoidance  Regardless of whether you can identify why you began having panic attacks or whether they seemed to come out of the blue, the places where you began having panic attacks often can become triggers themselves. It is not uncommon for individuals to begin to avoid the places where they have had panic attacks. Over time, the individual may begin to avoid more and more places, thereby decreasing their activities and often negatively impacting their quality of life. To break the cycle of avoidance, it is important to first identify the places or situations that are being avoided, and then to do some  "relearning."  To begin this intervention, first create a list of locations or situations that you tend to avoid. Then choose an avoided location or situation that you would like to target first. Now develop an "exposure hierarchy" for this situation or location. An "exposure hierarchy" is a list of actions that make you feel anxious in this situation. Order these actions from least to most anxiety-producing. It is often helpful to have the first item on your hierarchy involve thinking or imagining part of the feared/avoided situation.   Here is an example of an exposure hierarchy for decreasing avoidance of the grocery store. Note how it is ordered from the least amount of anxiety (at the top) to the most anxiety (at the bottom):  . Think about going to the grocery store alone.  . Go to the grocery store with a friend or family member.  . Go to the grocery store alone to pick up a few small items (5-10 minutes in the store).  . Shopping for 10-20 minutes in the store alone.  . Doing the shopping for the week by myself (20-30 minutes in the store).   Your homework is to "expose" yourself to the lowest item on your hierarchy and use your breathing relaxation and coping statements (see below) to help you remain in the situation. Practice this several times during the upcoming week. Once you have mastered each item with minimal anxiety, move on to the next higher action on your list.   Cognitive Interventions  1. Identify your negative self-talk Anxious thoughts can increase anxiety symptoms and panic. The first step in changing anxious thinking is to identify your own negative, alarming self-talk. Some common alarming thoughts:  . I'm having a heart attack.            . I must be going crazy. . I think I'm dying. . People will think I'm crazy. . I'm going to pass our.  . Oh no- here it comes.  . I can't stand this.  . I've got to get out of here!  2. Use positive coping statements Changing or  disrupting a pattern of anxious thoughts by replacing them with more calming or supportive statements can help to divert a panic attack. Some common helpful coping statements:  . This is not an emergency.  . I don't like feeling this way, but I can accept it.  . I can feel like this and still be okay.  . This has happened before, and I was okay. I'll be okay this time, too.  . I can be anxious and still deal with this situation.    /Emotional Wellbeing Apps and Websites Here are a few   free apps meant to help you to help yourself.  To find, try searching on the internet to see if the app is offered on Apple/Android devices. If your first choice doesn't come up on your device, the good news is that there are many choices! Play around with different apps to see which ones are helpful to you.    Calm This is an app meant to help increase calm feelings. Includes info, strategies, and tools for tracking your feelings.      Calm Harm  This app is meant to help with self-harm. Provides many 5-minute or 15-min coping strategies for doing instead of hurting yourself.       Healthy Minds Health Minds is a problem-solving tool to help deal with emotions and cope with stress you encounter wherever you are.      MindShift This app can help people cope with anxiety. Rather than trying to avoid anxiety, you can make an important shift and face it.      MY3  MY3 features a support system, safety plan and resources with the goal of offering a tool to use in a time of need.       My Life My Voice  This mood journal offers a simple solution for tracking your thoughts, feelings and moods. Animated emoticons can help identify your mood.       Relax Melodies Designed to help with sleep, on this app you can mix sounds and meditations for relaxation.      Smiling Mind Smiling Mind is meditation made easy: it's a simple tool that helps put a smile on your mind.        Stop, Breathe &  Think  A friendly, simple guide for people through meditations for mindfulness and compassion.  Stop, Breathe and Think Kids Enter your current feelings and choose a "mission" to help you cope. Offers videos for certain moods instead of just sound recordings.       Team Orange The goal of this tool is to help teens change how they think, act, and react. This app helps you focus on your own good feelings and experiences.      The Virtual Hope Box The Virtual Hope Box (VHB) contains simple tools to help patients with coping, relaxation, distraction, and positive thinking.     

## 2020-10-28 ENCOUNTER — Telehealth: Payer: Self-pay | Admitting: Family Medicine

## 2020-10-28 NOTE — Telephone Encounter (Signed)
Patient called stating that she is still waiting to be seen at [facility] for the connective tissue genetic testing office. She was told that they are 14 months out and they would not contact her to schedule until she is 6 months out. She asked if there is anything we can do to maybe push that along and get her seen sooner? She also said that her symptoms seems to be getting worse and she is down to 96 pounds. From the research they have been doing, everything seems to be connected. She asked if there was any treatment that Dr Tamala Julian could do to help her while she waits for the genetic testing?  Please advise.

## 2020-10-28 NOTE — Telephone Encounter (Signed)
Patient notified via My Chart

## 2020-10-28 NOTE — Telephone Encounter (Signed)
I am sorry, no this is not what I treat and do not know what the next step would be. She can try to find other people for me to refer to but these are who I suggest.  If weight loss becomes worse need to go to emergency room to be evaluated  Ask her to check with her internist.

## 2020-11-02 NOTE — BH Specialist Note (Addendum)
Integrated Behavioral Health via Telemedicine Visit  11/02/2020 Rachel [name] [id number]  Number of [place] visits: 2 Session Start time: 8:45  Session End time: 9:25 Total time: 40   Referring Provider: Lynnda Shields, MD Patient/Family location: Home [facility] Provider location: [facility]  All persons participating in visit: [name] and Rachel [name]   Types of Service: Telephone visit  I connected with Rachel [name] and/or [place] by Telephone  (Video is Tree surgeon) and verified that I am speaking with the correct person using two identifiers.Discussed confidentiality: at previous visit  I discussed the limitations of telemedicine and the availability of in person appointments.  Discussed there is a possibility of technology failure and discussed alternative modes of communication if that failure occurs.  I discussed that engaging in this telemedicine visit, they consent to the provision of behavioral healthcare and the services will be billed under their insurance.  Patient and/or legal guardian expressed understanding and consented to Telemedicine visit: Yes   Presenting Concerns: Patient and/or family reports the following symptoms/concerns: Pt states her primary concern today is "snapping at" loved ones when pain increases "from a 7 to 9"; pain has increased after starting birth control; bleeding has continued. Pt began painting again to cope with emotions, and planning first vacation in 3 years.  Duration of problem: Ongoing; Severity of problem: moderately severe  Patient and/or Family's Strengths/Protective Factors: Social connections, Social and Emotional competence, Concrete supports in place (healthy food, safe environments, etc.) and Sense of purpose  Goals Addressed: Patient will: 1.  Reduce symptoms of: anxiety, depression and  stress  2.  Increase knowledge and/or ability of: self-management skills   Progress towards Goals: Ongoing  Interventions: Interventions utilized:  Mindfulness or Relaxation Training Standardized Assessments completed: Not Needed  Patient and/or Family Response: Pt agrees to revised treatment [name]  Assessment: Patient currently experiencing Post-traumatic stress disorder and Psychosocial stress, as well as history of Bipolar 1 disorder and ADHD (previously diagnosed).   Patient may benefit from continued psychoeducation and brief therapeutic interventions regarding coping with symptoms of anxiety, depression, and life stress .  [name]: 1. Follow up with behavioral health clinician on : Two weeks 2. Behavioral recommendations:  -Continue using self-coping strategies to help manage emotions (including resuming painting; self-talk, relaxation breathing with sleep sounds) -Begin using grounding strategy as needed daily (on After Visit Summary) -Continue making plans for vacation in May 3. Referral(s): [place] (In Clinic)  I discussed the assessment and treatment [name] with the patient and/or parent/guardian. They were provided an opportunity to ask questions and all were answered. They agreed with the [name] and demonstrated an understanding of the instructions.   They were advised to call back or seek an in-person evaluation if the symptoms worsen or if the condition fails to improve as anticipated.  Rachel Hamman Waneda Klammer, LCSW

## 2020-11-15 ENCOUNTER — Ambulatory Visit (INDEPENDENT_AMBULATORY_CARE_PROVIDER_SITE_OTHER): Payer: Medicaid Other | Admitting: Clinical

## 2020-11-15 ENCOUNTER — Telehealth: Payer: Self-pay | Admitting: Obstetrics and Gynecology

## 2020-11-15 DIAGNOSIS — F4310 Post-traumatic stress disorder, unspecified: Secondary | ICD-10-CM | POA: Diagnosis not present

## 2020-11-15 DIAGNOSIS — F909 Attention-deficit hyperactivity disorder, unspecified type: Secondary | ICD-10-CM

## 2020-11-15 DIAGNOSIS — Z658 Other specified problems related to psychosocial circumstances: Secondary | ICD-10-CM

## 2020-11-15 DIAGNOSIS — F319 Bipolar disorder, unspecified: Secondary | ICD-10-CM

## 2020-11-15 NOTE — Telephone Encounter (Signed)
Patient said the bleeding has not stopped since she was here in November 2021

## 2020-11-15 NOTE — Patient Instructions (Signed)
[facility] [address] [place], [place] [address] [phone] (main office) [phone] (Jacobi Nile's office)  Grounding Exercise:  *5 Things you see *4 Things you hear *3 Things you feel *2 Things you smell *1 Thing you taste

## 2020-11-16 NOTE — Telephone Encounter (Signed)
I called Rachel [name] back and she confirms since her 10/11/20 visit with Dr. Donavan Foil she has continued to bleed and it has not changed- is not better or worse. States she tried the ibuprofen but she believes it affected her gi tract ( has gastropareisis) and she stopped. She is taking the ocp's. She does have the nexplanon in. She states her pain has gotten worse- usually 7-8 but now 8-10. States she has chronic pain.  We discussed plan of care was to at least trial NSAID/s and ocp's for 3 months and may need nexplanon removed. I informed her Dr. Donavan Foil not here today; but I will send a message to him to see what his recommendations are  Including office visit , change in meds, etc. I explained we will call her back once we hear from him. She voices understanding. Rachel Mixon,RN

## 2020-11-17 DIAGNOSIS — F319 Bipolar disorder, unspecified: Secondary | ICD-10-CM | POA: Diagnosis not present

## 2020-11-17 DIAGNOSIS — K3184 Gastroparesis: Secondary | ICD-10-CM | POA: Diagnosis not present

## 2020-11-17 DIAGNOSIS — Z79899 Other long term (current) drug therapy: Secondary | ICD-10-CM | POA: Diagnosis not present

## 2020-11-17 DIAGNOSIS — R634 Abnormal weight loss: Secondary | ICD-10-CM | POA: Diagnosis not present

## 2020-11-17 DIAGNOSIS — Z888 Allergy status to other drugs, medicaments and biological substances status: Secondary | ICD-10-CM | POA: Diagnosis not present

## 2020-11-17 DIAGNOSIS — M6289 Other specified disorders of muscle: Secondary | ICD-10-CM | POA: Diagnosis not present

## 2020-11-17 DIAGNOSIS — Z881 Allergy status to other antibiotic agents status: Secondary | ICD-10-CM | POA: Diagnosis not present

## 2020-11-17 DIAGNOSIS — R110 Nausea: Secondary | ICD-10-CM | POA: Diagnosis not present

## 2020-11-17 DIAGNOSIS — K581 Irritable bowel syndrome with constipation: Secondary | ICD-10-CM | POA: Diagnosis not present

## 2020-11-17 DIAGNOSIS — K2100 Gastro-esophageal reflux disease with esophagitis, without bleeding: Secondary | ICD-10-CM | POA: Diagnosis not present

## 2020-11-17 DIAGNOSIS — K589 Irritable bowel syndrome without diarrhea: Secondary | ICD-10-CM | POA: Diagnosis not present

## 2020-11-17 DIAGNOSIS — K5900 Constipation, unspecified: Secondary | ICD-10-CM | POA: Diagnosis not present

## 2020-11-18 ENCOUNTER — Encounter: Payer: Self-pay | Admitting: General Practice

## 2020-11-18 NOTE — Telephone Encounter (Signed)
Warden Fillers, MD  Gerome Apley, RN The patient may need to be seen in person regarding her bleeding issue as well as having the nexplanon removed. Has she missed any OCP?, That would contribute to more irregular bleeding.

## 2020-11-19 NOTE — Telephone Encounter (Signed)
Returned call to patient.   Patient reports she has not missed any OCP's.the bleeding is the same, the pain has gotten worse, she reports it is a 9-10 now.    She has not been able to take the NSAIDS as it effected her stomach.   She has a follow up in the office on 1/21.   Message routed to Dr. Elgie Congo for recommendations for pain control.

## 2020-11-22 ENCOUNTER — Other Ambulatory Visit (INDEPENDENT_AMBULATORY_CARE_PROVIDER_SITE_OTHER): Payer: Medicaid Other | Admitting: Obstetrics and Gynecology

## 2020-11-22 DIAGNOSIS — R102 Pelvic and perineal pain: Secondary | ICD-10-CM

## 2020-11-22 MED ORDER — TRAMADOL HCL 50 MG PO TABS: 50.0000 mg | ORAL_TABLET | Freq: Four times a day (QID) | ORAL | 0 refills | Status: AC | PRN

## 2020-11-22 NOTE — BH Specialist Note (Deleted)
Integrated Behavioral Health via Telemedicine Visit  11/22/2020 [place] [id number]  Number of [place] visits: *** Session Start time: 8:4  Session End time: *** Total time: {IBH Total Time:21014050}  Referring Provider: *** Patient/Family location: *** [facility] Provider location: *** All persons participating in visit: *** Types of Service: {CHL AMB TYPE OF [phone]}  I connected with [name] and/or Joelyn Brossman's {family members:20773} by Telephone  (Video is Tree surgeon) and verified that I am speaking with the correct person using two identifiers.Discussed confidentiality: {YES/NO:21197}  I discussed the limitations of telemedicine and the availability of in person appointments.  Discussed there is a possibility of technology failure and discussed alternative modes of communication if that failure occurs.  I discussed that engaging in this telemedicine visit, they consent to the provision of behavioral healthcare and the services will be billed under their insurance.  Patient and/or legal guardian expressed understanding and consented to Telemedicine visit: {YES/NO:21197}  Presenting Concerns: Patient and/or family reports the following symptoms/concerns: *** Duration of problem: ***; Severity of problem: {Mild/Moderate/Severe:20260}  Patient and/or Family's Strengths/Protective Factors: {CHL AMB BH PROTECTIVE [phone]}  Goals Addressed: Patient will: 1.  Reduce symptoms of: {IBH Symptoms:21014056}  2.  Increase knowledge and/or ability of: {IBH Patient Tools:21014057}  3.  Demonstrate ability to: {IBH Goals:21014053}  Progress towards Goals: {CHL AMB BH PROGRESS TOWARDS GOALS:[phone]}  Interventions: Interventions utilized:  {IBH Interventions:21014054} Standardized Assessments completed: {IBH Screening Tools:21014051}  Patient and/or Family Response: ***  Assessment: Patient currently  experiencing ***.   Patient may benefit from ***.  Plan: 1. Follow up with behavioral health clinician on : *** 2. Behavioral recommendations: *** 3. Referral(s): {IBH Referrals:21014055}  I discussed the assessment and treatment plan with the patient and/or parent/guardian. They were provided an opportunity to ask questions and all were answered. They agreed with the plan and demonstrated an understanding of the instructions.   They were advised to call back or seek an in-person evaluation if the symptoms worsen or if the condition fails to improve as anticipated.  Caroleen Hamman Addilyn Satterwhite, LCSW

## 2020-11-22 NOTE — Progress Notes (Signed)
Ultram rx sent for pain control until seen

## 2020-11-24 DIAGNOSIS — Z1152 Encounter for screening for COVID-19: Secondary | ICD-10-CM | POA: Diagnosis not present

## 2020-11-29 ENCOUNTER — Encounter: Payer: Self-pay | Admitting: *Deleted

## 2020-12-01 NOTE — BH Specialist Note (Addendum)
Integrated Behavioral Health via Telemedicine Visit  12/01/2020 Rachel [name] [id number]  Number of [place] visits: 3 Session Start time: 9:46  Session End time: 10:25 Total time: 39  Referring Provider: Lynnda Shields, MD Patient/Family location: Home [facility] Provider location: [facility]  All persons participating in visit: [name] and Rachel [name]   Types of Service: Telephone visit  I connected with Rachel [name] [name] n/a by Telephone  (Video is Tree surgeon) and verified that I am speaking with the correct person using two identifiers.Discussed confidentiality: Yes   I discussed the limitations of telemedicine and the availability of in person appointments.  Discussed there is a possibility of technology failure and discussed alternative modes of communication if that failure occurs.  I discussed that engaging in this telemedicine visit, they consent to the provision of behavioral healthcare and the services will be billed under their insurance.  Patient and/or legal guardian expressed understanding and consented to Telemedicine visit: Yes   Presenting Concerns: Patient and/or family reports the following symptoms/concerns: Pt states her primary concern today is "absorbing other people's emotions"; is using grounding exercise to help manage own emotions; helps to talk through feelings today, and hopeful that upcoming surgery will provide more answers about chronic pain. Duration of problem: Ongoing; Severity of problem: moderately severe  Patient and/or Family's Strengths/Protective Factors: Social connections, Social and Emotional competence, Concrete supports in place (healthy food, safe environments, etc.) and Sense of purpose  Goals Addressed: Patient will: 1.  Reduce symptoms of: anxiety, depression and stress  2.  Increase  knowledge and/or ability of: self-management skills   Progress towards Goals: Ongoing  Interventions: Interventions utilized:  Mindfulness or Relaxation Training Standardized Assessments completed: Not Needed  Patient and/or Family Response: Pt agrees to treatment [name]  Assessment: Patient currently experiencing Post-traumatic stress disorder and Psychosocial stress, as well as history of Bipolar 1 disorder and ADHD (previously diagnosed).   Patient may benefit from continued psychoeducation and brief therapeutic interventions regarding coping with symptoms of anxiety, depression, and current life stress .  [name]: 1. Follow up with behavioral health clinician on : Three weeks 2. Behavioral recommendations:  -Write down all problems from self and others that are causing emotional reaction (using two colors to visualize separation between own emotions and other people's emotions) -Continue using self-coping strategies daily to manage emotions and physical pain (painting, self-talk, relaxation breathing with sleep sounds, grounding strategy; planning vacation) 3. Referral(s): [place] (In Clinic)  I discussed the assessment and treatment [name] with the patient and/or parent/guardian. They were provided an opportunity to ask questions and all were answered. They agreed with the [name] and demonstrated an understanding of the instructions.   They were advised to call back or seek an in-person evaluation if the symptoms worsen or if the condition fails to improve as anticipated.  Rachel [name] Rachel Fusco, LCSW

## 2020-12-03 ENCOUNTER — Encounter: Payer: Self-pay | Admitting: Obstetrics and Gynecology

## 2020-12-03 ENCOUNTER — Ambulatory Visit (INDEPENDENT_AMBULATORY_CARE_PROVIDER_SITE_OTHER): Payer: Medicaid Other | Admitting: Obstetrics and Gynecology

## 2020-12-03 ENCOUNTER — Other Ambulatory Visit: Payer: Self-pay

## 2020-12-03 VITALS — BP 121/88 | HR 93 | Ht 62.6 in | Wt 101.3 lb

## 2020-12-03 DIAGNOSIS — N941 Unspecified dyspareunia: Secondary | ICD-10-CM | POA: Diagnosis not present

## 2020-12-03 DIAGNOSIS — N938 Other specified abnormal uterine and vaginal bleeding: Secondary | ICD-10-CM | POA: Diagnosis not present

## 2020-12-03 DIAGNOSIS — N946 Dysmenorrhea, unspecified: Secondary | ICD-10-CM

## 2020-12-03 DIAGNOSIS — R102 Pelvic and perineal pain: Secondary | ICD-10-CM

## 2020-12-03 DIAGNOSIS — Z3046 Encounter for surveillance of implantable subdermal contraceptive: Secondary | ICD-10-CM

## 2020-12-03 MED ORDER — NORETHIN ACE-ETH ESTRAD-FE 1-20 MG-MCG(24) PO TABS: 1.0000 | ORAL_TABLET | Freq: Every day | ORAL | 11 refills | Status: DC

## 2020-12-03 NOTE — Progress Notes (Signed)
°  CC: pelvic pain, irregular bleeding Subjective:    Patient ID: Rachel [name] Plan, female    DOB: Feb 18, 1986, 35 y.o.   MRN: [id number]  HPI Pt seen.  She still continues with pelvic pain and irregular bleeding, even though she states the last few days the bleeding has tailed off.  At this time I recommend removal of the nexplanon device and going forward with diagnostic laparoscopy to see if there is a cause for her pain.   Review of Systems  Constitutional: Negative.   HENT: Negative.   Respiratory: Negative.   Cardiovascular: Negative.   Gastrointestinal: Positive for abdominal pain.  Genitourinary: Positive for dyspareunia, menstrual problem and pelvic pain.  Musculoskeletal: Negative.   Neurological: Negative.   Psychiatric/Behavioral: The patient is nervous/anxious.        Objective:   Physical Exam Vitals:   12/03/20 0840  BP: 121/88  Pulse: 93         Assessment & Plan:   1. DUB (dysfunctional uterine bleeding) nexplanon device removed as potential cause for uterine bleeding Continue OCP for now  2. Dysmenorrhea   3. Dyspareunia in female   4. Pelvic pain in female Diagnostic laparoscopy will be scheduled for further evaluation.  Pt will need open entry due to thin body habitus. - Norethindrone Acetate-Ethinyl Estrad-FE (LOESTRIN 24 FE) 1-20 MG-MCG(24) tablet; Take 1 tablet by mouth daily.  Dispense: 28 tablet; Refill: 11  5. Encounter for removal of subdermal contraceptive implant See separate note  I spent 10 minutes dedicated to the care of this patient including previsit review of records, face to face time with the patient discussing etiology of endometriosis, evaluation and treatment along with post visit testing.    [name], MD Faculty Attending, [facility] for [facility]

## 2020-12-03 NOTE — Progress Notes (Signed)
Pain has gotten worse, no changes in the bleeding.

## 2020-12-03 NOTE — Progress Notes (Signed)
[place] REMOVAL NOTE  Date of LMP:   Irregular bleeding  Contraception used: *Nexplanon   Indications:  The patient desires contraception.  She understands risks, benefits, and alternatives to Implanon and would like to proceed.  Anesthesia:   Lidocaine 1% plain.  Procedure:  A time-out was performed confirming the procedure and the patient's allergy status.  Complications: None  The rod was palpated and the area was sterilely prepped.  The area beneath the distal tip was anesthetized with 1% xylocaine and the skin incised linearly over the tip and the tip was exposed, grasped with forcep and removed intact.  The incision was closed using two steristrips.    A bandage was applied and the arm was wrapped with gauze bandage.  The patient tolerated the procedure well.  Instructions:  The patient was instructed to remove the dressing in 24 hours and that some bruising is to be expected.  She was advised to use over the counter analgesics as needed for any pain at the site.  She is to keep the area dry for 24 hours and to call if her hand or arm becomes cold, numb, or blue.  Return visit:  Return in PRN.  She is being scheduled for laparoscopy.   Lynnda Shields, MD Faculty Attending Center for [facility]

## 2020-12-03 NOTE — Patient Instructions (Signed)
Endometriosis  Endometriosis is a condition in which a tissue similar to the endometrium grows in places outside the uterus. The endometrium is a tissue that forms the lining of the uterus. This tissue can grow in the organs that create the eggs (ovaries), the tubes that carry the eggs to the uterus (fallopian tubes), the vagina, and the bowel. This tissue most often grows on the ovaries and inner lining of the pelvic cavity (peritoneum). When the uterus sheds the endometrium every menstrual cycle, there is bleeding wherever these types of tissue are located. This can cause pain because blood is irritating to tissues that are not normally exposed to it. Endometriosis can also make it harder for a woman to get pregnant. What are the causes? The cause of this condition is not known. What increases the risk? The following factors may make you more likely to develop this condition:  Having a family history of endometriosis.  Having never given birth.  Starting your period at 39 years of age or younger. What are the signs or symptoms? Often, there are no symptoms of this condition. If you do have symptoms, they may:  Vary depending on where the abnormal tissue is growing.  Occur during your menstrual period (most often) or at the middle of your cycle.  Come and go. You may have no symptoms during some months.  Stop when you no longer have your monthly periods (menopause). Symptoms may include:  Pain in the area between your hip bones (pelvis).  Heavier bleeding during periods.  Menstrual periods that happen more than once a month.  Pain during sex.  Pain in the back or abdomen.  Painful bowel movements.  Not being able to get pregnant. How is this diagnosed? This condition is diagnosed based on your symptoms and a physical exam. You may have tests, such as:  Blood tests and urine tests to help rule out other causes.  Ultrasound to look for tissues that are not normal. This is  often done over your skin. It is sometimes done through the vagina (transvaginal).  X-ray of the lower bowel (barium enema).  CT scan.  MRI. To confirm the diagnosis, your health care provider may use a device with a small camera to check tissue inside your abdomen (laparoscopy). Abnormal tissue may be removed and checked in a lab (biopsy). How is this treated? There is no cure for this condition. Treatment focuses on controlling your symptoms. The type of treatment also depends on whether you want to become pregnant in the future. This condition may be treated with:  Medicines. These may include: ? Medicines to relieve pain, including NSAIDs such as ibuprofen. ? Hormone therapy. This uses artificial hormones to slow the growth of the abnormal tissue. This may include hormonal birth control, such as pills.  Surgery to remove the abnormal tissue. During surgery: ? Tissue may be removed using a laparoscope and a laser (laparoscopic laser treatment). ? The fallopian tubes, uterus, and ovaries may be removed (hysterectomy). This is done in very severe cases. Follow these instructions at home:  Get regular exercise.  Limit alcohol use.  Eat a balanced diet.  Avoid caffeine.  Take over-the-counter and prescription medicines only as told by your health care provider.  Keep all follow-up visits as told by your health care provider. This is important. Where to find more information  [employer]: EgoNews.co.uk  Office on Women's Health: ChromeDoors.com.ee Contact a health care provider if:  You are having new  pain or trouble controlling pain.  You have problems getting pregnant.  You have a fever. Get help right away if you have:  Severe pain that does not get better with medicine.  Severe nausea and vomiting, or if you cannot eat or drink without vomiting.  Pain that affects your abdomen only on the lower, right  side.  Pain in your abdomen that gets worse.  Swelling in your abdomen.  Blood in your stool (feces). Summary  Endometriosis is a condition in which a tissue similar to the endometrium grows in places outside the uterus. The endometrium is a tissue that forms the lining of the uterus.  The cause of this condition is not known.  This condition may be treated with medicines to relieve pain, hormone therapy, or surgery.  If you have this condition, get regular exercise, limit alcohol use, and avoid caffeine.  Get help right away if you have severe pain that does not get better with medicine, or if you have severe nausea and vomiting or blood in your stool. This information is not intended to replace advice given to you by your health care provider. Make sure you discuss any questions you have with your health care provider. Document Revised: 12/17/2019 Document Reviewed: 12/17/2019 Elsevier Patient Education  2021 [place]. Diagnostic Laparoscopy Diagnostic laparoscopy is a procedure to diagnose problems in the abdomen. It might be done for a variety of reasons, such as to look for scar tissue, a reason for abdominal pain, an abdominal mass or tumor, or fluid in the abdomen (ascites). This procedure may also be done to remove a tissue sample from the liver to look at under a microscope (biopsy). During the procedure, a thin, flexible tube that has a light and a camera on the end (laparoscope) is inserted through a small incision in the abdomen. The image from the camera is shown on a monitor to help the surgeon see inside the body. Tell a health care provider about:  Any allergies you have.  All medicines you are taking, including vitamins, herbs, eye drops, creams, and over-the-counter medicines.  Any problems you or family members have had with anesthetic medicines.  Any blood disorders you have.  Any surgeries you have had.  Any medical conditions you have.  Whether you are  pregnant or may be pregnant. What are the risks? Generally, this is a safe procedure. However, problems may occur, including:  Infection.  Bleeding.  Allergic reactions to medicines or dyes.  Damage to abdominal structures or organs, such as the intestines, liver, stomach, or spleen. What happens before the procedure? Staying hydrated Follow instructions from your health care provider about hydration, which may include:  Up to 2 hours before the procedure - you may continue to drink clear liquids, such as water, clear fruit juice, black coffee, and plain tea.   Eating and drinking restrictions Follow instructions from your health care provider about eating and drinking, which may include:  8 hours before the procedure - stop eating heavy meals or foods, such as meat, fried foods, or fatty foods.  6 hours before the procedure - stop eating light meals or foods, such as toast or cereal.  6 hours before the procedure - stop drinking milk or drinks that contain milk.  2 hours before the procedure - stop drinking clear liquids. Medicines Ask your health care provider about:  Changing or stopping your regular medicines. This is especially important if you are taking diabetes medicines or blood thinners.  Taking  medicines such as aspirin and ibuprofen. These medicines can thin your blood. Do not take these medicines unless your health care provider tells you to take them.  Taking over-the-counter medicines, vitamins, herbs, and supplements. General instructions  Ask your health care provider: ? How your surgery site will be marked. ? What steps will be taken to help prevent infection. These steps may include:  Removing hair at the surgery site.  Washing skin with a germ-killing soap.  Taking antibiotic medicine.  Plan to have a responsible adult take you home from the hospital or clinic.  Plan to have a responsible adult care for you for the time you are told after you leave  the hospital or clinic. This is important. What happens during the procedure?  An IV will be inserted into one of your veins.  You will be given one or more of the following: ? A medicine to help you relax (sedative). ? A medicine to numb the area (local anesthetic). ? A medicine to make you fall asleep (general anesthetic).  A breathing tube will be placed down your throat to help you breathe during the procedure.  Your abdomen will be filled with an air-like gas so that your abdomen expands. This will give the surgeon more room to operate and will make your organs easier to see.  Many small incisions will be made in your abdomen.  A laparoscope and other surgical instruments will be inserted into your abdomen through these incisions.  A biopsy may be done. This will depend on the reason why you are having this procedure.  The laparoscope and other instruments will be removed from your abdomen.  The air-like gas will be released from your abdomen.  Your incisions will be closed with stitches (sutures), skin glue, or surgical tapes and covered with a bandage (dressing).  Your breathing tube will be removed. The procedure may vary among health care providers and hospitals.   What happens after the procedure?  Your blood pressure, heart rate, breathing rate, and blood oxygen level will be monitored until you leave the hospital or clinic.  If you were given a sedative during the procedure, it can affect you for several hours. Do not drive or operate machinery until your health care provider says that it is safe.  It is up to you to get the results of your procedure. Ask your health care provider, or the department that is doing the procedure, when your results will be ready. Summary  Diagnostic laparoscopy is a procedure to diagnose problems in the abdomen using a thin, flexible tube that has a light and a camera on the end (laparoscope).  Follow instructions from your health care  provider about how to prepare for the procedure.  Plan to have a responsible adult care for you for the time you are told after you leave the hospital or clinic. This is important. This information is not intended to replace advice given to you by your health care provider. Make sure you discuss any questions you have with your health care provider. Document Revised: 06/25/2020 Document Reviewed: 06/25/2020 Elsevier Patient Education  2021 [employer].

## 2020-12-06 ENCOUNTER — Ambulatory Visit (INDEPENDENT_AMBULATORY_CARE_PROVIDER_SITE_OTHER): Payer: Medicaid Other | Admitting: Clinical

## 2020-12-06 DIAGNOSIS — Z658 Other specified problems related to psychosocial circumstances: Secondary | ICD-10-CM

## 2020-12-06 DIAGNOSIS — F4310 Post-traumatic stress disorder, unspecified: Secondary | ICD-10-CM | POA: Diagnosis not present

## 2020-12-06 DIAGNOSIS — F319 Bipolar disorder, unspecified: Secondary | ICD-10-CM

## 2020-12-06 DIAGNOSIS — F909 Attention-deficit hyperactivity disorder, unspecified type: Secondary | ICD-10-CM

## 2020-12-06 NOTE — Patient Instructions (Signed)
[facility] [address] [place], [place] [address] [phone] (main office) [phone] (Josette Shimabukuro's office)   

## 2020-12-14 ENCOUNTER — Telehealth: Payer: Self-pay | Admitting: Obstetrics and Gynecology

## 2020-12-14 NOTE — Telephone Encounter (Addendum)
Called pt and pt asked if there is anything that could prevent her from traveling in May.  I informed pt that the type of surgery that she is having should not keep her from traveling in May.  Pt verbalized understanding.   Mel Almond, RN  12/21/20

## 2020-12-14 NOTE — Telephone Encounter (Signed)
Pt states that she is having surg 12-29-20 and wants to know if there will be any issues for post surg or other poss surg that will interfere with her travels out of the country in May.

## 2020-12-22 ENCOUNTER — Other Ambulatory Visit: Payer: Self-pay

## 2020-12-22 ENCOUNTER — Encounter (HOSPITAL_BASED_OUTPATIENT_CLINIC_OR_DEPARTMENT_OTHER): Payer: Self-pay | Admitting: Obstetrics and Gynecology

## 2020-12-23 NOTE — BH Specialist Note (Addendum)
Integrated Behavioral Health via Telemedicine Visit  12/23/2020 [name] [id number]  Number of [place] visits: 4 Session Start time: 8:18  Session End time: 9:20 Total time: 61  Referring Provider: Lynnda Shields, MD Patient/Family location: Home [facility] Provider location: [facility]  All persons participating in visit: [name] and [place]   Types of Service: Telephone visit  I connected with Rachel [name] [name] n/a by Telephone  (Video is Tree surgeon) and verified that I am speaking with the correct person using two identifiers.Discussed confidentiality: Yes   I discussed the limitations of telemedicine and the availability of in person appointments.  Discussed there is a possibility of technology failure and discussed alternative modes of communication if that failure occurs.  I discussed that engaging in this telemedicine visit, they consent to the provision of behavioral healthcare and the services will be billed under their insurance.  Patient and/or legal guardian expressed understanding and consented to Telemedicine visit: Yes   Presenting Concerns: Patient and/or family reports the following symptoms/concerns: Pt states her primary concern today is  preparing for upcoming surgery; helps to talk through anxious feelings regarding outcome of surgery.  Duration of problem: Ongoing; Severity of problem: moderately severe  Patient and/or Family's Strengths/Protective Factors: Social connections, Concrete supports in place (healthy food, safe environments, etc.) and Sense of purpose  Goals Addressed: Patient will: 1.  Reduce symptoms of: anxiety, depression and stress  2.  Demonstrate ability to: Increase healthy adjustment to current life circumstances  Progress towards Goals: Ongoing  Interventions: Interventions  utilized:  Supportive Counseling Standardized Assessments completed: Not Needed  Patient and/or Family Response: Pt agrees to treatment [name]  Assessment: Patient currently experiencing PTSD, Psychosocial stress, and history of Bipolar 1 disorder and ADHD (both previously diagnosed via psychiatry).   Patient may benefit from continued psychoeducation and brief therapeutic interventions regarding coping with symptoms of anxiety, depression, stress .  [name]: 1. Follow up with behavioral health clinician on : Two weeks 2. Behavioral recommendations:  -Continue with [name] to attend scheduled surgery tomorrow morning; prepare home space for recovery time, as discussed -Continue using daily self-coping strategies to manage emotions  -Remember to be thankful to your body for alerting you that something does not feel right, and congratulate yourself for paying attention and advocating for your own health care 3. Referral(s): [place] (In Clinic)  I discussed the assessment and treatment [name] with the patient and/or parent/guardian. They were provided an opportunity to ask questions and all were answered. They agreed with the [name] and demonstrated an understanding of the instructions.   They were advised to call back or seek an in-person evaluation if the symptoms worsen or if the condition fails to improve as anticipated.  Rachel [name] Rachel Deland, LCSW

## 2020-12-25 ENCOUNTER — Inpatient Hospital Stay (HOSPITAL_COMMUNITY): Admission: RE | Admit: 2020-12-25 | Payer: Medicaid Other | Source: Ambulatory Visit

## 2020-12-27 ENCOUNTER — Other Ambulatory Visit: Payer: Self-pay

## 2020-12-27 ENCOUNTER — Other Ambulatory Visit (HOSPITAL_COMMUNITY)
Admission: RE | Admit: 2020-12-27 | Discharge: 2020-12-27 | Disposition: A | Discharge status: Home / Self Care | Payer: Medicaid Other | Source: Ambulatory Visit | Attending: Obstetrics and Gynecology | Admitting: Obstetrics and Gynecology

## 2020-12-27 ENCOUNTER — Encounter (HOSPITAL_BASED_OUTPATIENT_CLINIC_OR_DEPARTMENT_OTHER)
Admission: RE | Admit: 2020-12-27 | Discharge: 2020-12-27 | Disposition: A | Discharge status: Home / Self Care | Payer: Medicaid Other | Source: Ambulatory Visit | Attending: Obstetrics and Gynecology | Admitting: Obstetrics and Gynecology

## 2020-12-27 DIAGNOSIS — Z01812 Encounter for preprocedural laboratory examination: Secondary | ICD-10-CM | POA: Insufficient documentation

## 2020-12-27 DIAGNOSIS — Z20822 Contact with and (suspected) exposure to covid-19: Secondary | ICD-10-CM | POA: Insufficient documentation

## 2020-12-27 LAB — CBC
HCT: 43 % (ref 36.0–46.0)
Hemoglobin: 14.4 g/dL (ref 12.0–15.0)
MCH: 31.7 pg (ref 26.0–34.0)
MCHC: 33.5 g/dL (ref 30.0–36.0)
MCV: 94.7 fL (ref 80.0–100.0)
Platelets: 298 10*3/uL (ref 150–400)
RBC: 4.54 MIL/uL (ref 3.87–5.11)
RDW: 12.8 % (ref 11.5–15.5)
WBC: 7 10*3/uL (ref 4.0–10.5)
nRBC: 0 % (ref 0.0–0.2)

## 2020-12-27 LAB — SARS CORONAVIRUS 2 (TAT 6-24 HRS): SARS Coronavirus 2: NEGATIVE

## 2020-12-27 LAB — TYPE AND SCREEN
ABO/RH(D): O NEG
Antibody Screen: NEGATIVE

## 2020-12-27 LAB — POCT PREGNANCY, URINE: Preg Test, Ur: NEGATIVE

## 2020-12-28 ENCOUNTER — Ambulatory Visit (INDEPENDENT_AMBULATORY_CARE_PROVIDER_SITE_OTHER): Payer: Medicaid Other | Admitting: Clinical

## 2020-12-28 DIAGNOSIS — F909 Attention-deficit hyperactivity disorder, unspecified type: Secondary | ICD-10-CM

## 2020-12-28 DIAGNOSIS — Z658 Other specified problems related to psychosocial circumstances: Secondary | ICD-10-CM

## 2020-12-28 DIAGNOSIS — F319 Bipolar disorder, unspecified: Secondary | ICD-10-CM

## 2020-12-28 DIAGNOSIS — F4310 Post-traumatic stress disorder, unspecified: Secondary | ICD-10-CM

## 2020-12-28 NOTE — BH Specialist Note (Addendum)
Integrated Behavioral Health via Telemedicine Visit  12/28/2020 [name] [id number]  Number of [place] visits: 5 Session Start time: 10:17  Session End time: 10:56 Total time: 81  Referring Provider: Lynnda Shields, MD Patient/Family location: Home [facility] Provider location: Center for [facility] at [facility]  All persons participating in visit: [name] and [place]   Types of Service: Individual psychotherapy  I connected with [name] and/or [place] by Telephone  (Video is Tree surgeon) and verified that I am speaking with the correct person using two identifiers.Discussed confidentiality: Yes   I discussed the limitations of telemedicine and the availability of in person appointments.  Discussed there is a possibility of technology failure and discussed alternative modes of communication if that failure occurs.  I discussed that engaging in this telemedicine visit, they consent to the provision of behavioral healthcare and the services will be billed under their insurance.  Patient and/or legal guardian expressed understanding and consented to Telemedicine visit: Yes   Presenting Concerns: Patient and/or family reports the following symptoms/concerns: Pt states her primary concern today is  having to "mentally prepare myself" for moving surgery date; pt agrees that with "stomach bug" prior to surgery, in the long-term is a positive move. Pt needs to talk through fears about unknown findings of upcoming surgery; planning a meaningful tattoo post-surgery to help cope.  Duration of problem: Ongoing; Severity of problem: moderate  Patient and/or Family's Strengths/Protective Factors: Social connections, Concrete supports in place (healthy food, safe environments, etc.) and Sense of purpose  Goals Addressed: Patient will: 1.  Reduce symptoms of: anxiety,  depression and stress  2.  Demonstrate ability to: Increase healthy adjustment to current life circumstances  Progress towards Goals: Ongoing  Interventions: Interventions utilized:  Supportive Counseling Standardized Assessments completed: Not Needed  Patient and/or Family Response: Pt agrees with treatment plan  Assessment: Patient currently experiencing PTSD and Psychosocial stress; Bipolar 1 disorder and ADHD (both previous diagnoses via psychiatry).   Patient may benefit from continued psychoeducation and brief therapeutic interventions regarding coping with symptoms of anxiety, depression, stress .  Plan: 1. Follow up with behavioral health clinician on : Two weeks 2. Behavioral recommendations:  -Continue focusing on "stress cleaning" and preparing home surroundings for upcoming surgery and later travel. Focus on what you are able to do now, as pain level allows -Continue using DuoLingo app daily to prepare for travel communication; Consider researching translation apps to find one that's most user-friendly for you 3. Referral(s): [place] (In Clinic)  I discussed the assessment and treatment plan with the patient and/or parent/guardian. They were provided an opportunity to ask questions and all were answered. They agreed with the plan and demonstrated an understanding of the instructions.   They were advised to call back or seek an in-person evaluation if the symptoms worsen or if the condition fails to improve as anticipated.  Caroleen Hamman Aivy Akter, LCSW

## 2021-01-04 ENCOUNTER — Other Ambulatory Visit: Payer: Self-pay | Admitting: Family Medicine

## 2021-01-04 DIAGNOSIS — J4520 Mild intermittent asthma, uncomplicated: Secondary | ICD-10-CM

## 2021-01-04 MED ORDER — ALBUTEROL SULFATE HFA 108 (90 BASE) MCG/ACT IN AERS: 1.0000 | INHALATION_SPRAY | Freq: Four times a day (QID) | RESPIRATORY_TRACT | 6 refills | Status: DC | PRN

## 2021-01-04 NOTE — Telephone Encounter (Signed)
Copied from [place] [id number]. Topic: Quick Communication - Rx Refill/Question >> Jan 04, 2021 11:48 AM Leward Quan A wrote: Medication: albuterol (VENTOLIN HFA) 108 (90 Base) MCG/ACT inhaler, triamcinolone (KENALOG) 0.1 %   Has the patient contacted their pharmacy? Yes.   (Agent: If no, request that the patient contact the pharmacy for the refill.) (Agent: If yes, when and what did the pharmacy advise?)  Preferred Pharmacy (with phone number or street name): [facility] [address] [place]  Phone:  [phone] Fax:  [phone]     Agent: Please be advised that RX refills may take up to 3 business days. We ask that you follow-up with your pharmacy.

## 2021-01-05 ENCOUNTER — Telehealth: Payer: Self-pay | Admitting: Family Medicine

## 2021-01-05 NOTE — Telephone Encounter (Signed)
Copied from [place] [id number]. Topic: General - Other >> Jan 04, 2021 11:44 AM Leward Quan A wrote: Reason for CRM: Patient called in to inform Dr Margarita Rana that she will be having a diagnosis surgery for Endometriosis, also will be traveling to [place] need to know if there is any additional vaccine that will be needed, also need a letter since this will be international travel to say that she is on a list of medication and need to take them Ph# [phone]

## 2021-01-05 NOTE — Telephone Encounter (Signed)
Will route to PCP for review. 

## 2021-01-05 NOTE — Telephone Encounter (Signed)
I see she is on Linzess and an oral contraceptive pill and would need to know if there are additional medications she takes so I can include in the letter. I will recommend visiting the travel clinic at the health department for questions regarding travel to [place].

## 2021-01-05 NOTE — Telephone Encounter (Signed)
Additional medications are Allegra, Albuterol, Vit C, Vit D, and melatonin

## 2021-01-06 NOTE — Telephone Encounter (Signed)
Letter has been sent via MyChart

## 2021-01-07 ENCOUNTER — Other Ambulatory Visit: Payer: Self-pay

## 2021-01-07 ENCOUNTER — Ambulatory Visit: Payer: Medicaid Other | Attending: Family Medicine | Admitting: Pharmacist

## 2021-01-07 DIAGNOSIS — Z23 Encounter for immunization: Secondary | ICD-10-CM

## 2021-01-07 NOTE — Progress Notes (Signed)
Patient presents for vaccination against tetanus per orders of Dr. Margarita Rana. Consent given. Counseling provided. No contraindications exists. Vaccine administered without incident.   Benard Halsted, PharmD, [place], [place] [phone]

## 2021-01-11 ENCOUNTER — Ambulatory Visit (INDEPENDENT_AMBULATORY_CARE_PROVIDER_SITE_OTHER): Payer: Medicaid Other | Admitting: Clinical

## 2021-01-11 DIAGNOSIS — F4310 Post-traumatic stress disorder, unspecified: Secondary | ICD-10-CM

## 2021-01-11 DIAGNOSIS — F909 Attention-deficit hyperactivity disorder, unspecified type: Secondary | ICD-10-CM

## 2021-01-11 DIAGNOSIS — Z658 Other specified problems related to psychosocial circumstances: Secondary | ICD-10-CM

## 2021-01-11 DIAGNOSIS — F319 Bipolar disorder, unspecified: Secondary | ICD-10-CM

## 2021-01-11 NOTE — BH Specialist Note (Addendum)
Integrated Behavioral Health via Telemedicine Visit  01/11/2021 Rachel [name] [id number]  Number of [place] visits: 6 Session Start time: 10:15  Session End time: 10:44 Total time: 29  Referring Provider: Lynnda Shields, MD Patient/Family location: Home [facility] Provider location: Center for [facility] at [facility] for Women  All persons participating in visit: [name] and [place] \  Types of Service: Individual psychotherapy and Telephone visit  I connected with [name] and/or [place] by Telephone  (Video is Tree surgeon) and verified that I am speaking with the correct person using two identifiers.Discussed confidentiality: Yes   I discussed the limitations of telemedicine and the availability of in person appointments.  Discussed there is a possibility of technology failure and discussed alternative modes of communication if that failure occurs.  I discussed that engaging in this telemedicine visit, they consent to the provision of behavioral healthcare and the services will be billed under their insurance.  Patient and/or legal guardian expressed understanding and consented to Telemedicine visit: Yes   Presenting Concerns: Patient and/or family reports the following symptoms/concerns: Pt states her primary concern today is managing a high pain level day, and worry over unknown outcome of surgery tomorrow; helps best to talk through feelings regarding surgery and what this means for managing future pain and not having children.  Duration of problem: Increase as surgery gets closer; Severity of problem: moderate  Patient and/or Family's Strengths/Protective Factors: Concrete supports in place (healthy food, safe environments, etc.) and Sense of purpose  Goals Addressed: Patient will: 1.  Reduce symptoms of: anxiety, depression and stress  2.  Increase knowledge and/or  ability of: stress reduction  3.  Demonstrate ability to: Increase healthy adjustment to current life circumstances  Progress towards Goals: Ongoing  Interventions: Interventions utilized:  Supportive Counseling Standardized Assessments completed: Not Needed  Patient and/or Family Response: Pt agrees with treatment plan  Assessment: Patient currently experiencing PTSD and Psychosocial stress; Bipolar 1 disorder and ADHD (both diagnosed previously via psychiatry).   Patient may benefit from continued supportive counseling today.  Plan: 1. Follow up with behavioral health clinician on : One week brief phone check follow up post-surgery 2. Behavioral recommendations:  -Continue with plan to eat GI-friendly foods today, and focus on relaxation and calming activities today through early morning tomorrow, in preparation for surgery -Continue with plan to schedule meaningful tattoo post-surgery (following medical advice regarding placement due to surgery) -Continue with plan for birthday celebration in three weeks, keeping in mind energy level and healing from surgery -Continue using DuoLingo app daily, until travel date 3. Referral(s): [place] (In Clinic)  I discussed the assessment and treatment plan with the patient and/or parent/guardian. They were provided an opportunity to ask questions and all were answered. They agreed with the plan and demonstrated an understanding of the instructions.   They were advised to call back or seek an in-person evaluation if the symptoms worsen or if the condition fails to improve as anticipated.  Caroleen Hamman T[place], LCSW

## 2021-01-19 ENCOUNTER — Encounter (HOSPITAL_BASED_OUTPATIENT_CLINIC_OR_DEPARTMENT_OTHER): Payer: Self-pay | Admitting: Obstetrics and Gynecology

## 2021-01-19 ENCOUNTER — Other Ambulatory Visit: Payer: Self-pay

## 2021-01-22 ENCOUNTER — Other Ambulatory Visit (HOSPITAL_COMMUNITY)
Admission: RE | Admit: 2021-01-22 | Discharge: 2021-01-22 | Disposition: A | Discharge status: Home / Self Care | Payer: Medicaid Other | Source: Ambulatory Visit | Attending: Obstetrics and Gynecology | Admitting: Obstetrics and Gynecology

## 2021-01-22 DIAGNOSIS — Z01812 Encounter for preprocedural laboratory examination: Secondary | ICD-10-CM | POA: Insufficient documentation

## 2021-01-22 DIAGNOSIS — Z20822 Contact with and (suspected) exposure to covid-19: Secondary | ICD-10-CM | POA: Insufficient documentation

## 2021-01-22 LAB — SARS CORONAVIRUS 2 (TAT 6-24 HRS): SARS Coronavirus 2: NEGATIVE

## 2021-01-24 ENCOUNTER — Encounter (HOSPITAL_BASED_OUTPATIENT_CLINIC_OR_DEPARTMENT_OTHER)
Admission: RE | Admit: 2021-01-24 | Discharge: 2021-01-24 | Disposition: A | Discharge status: Home / Self Care | Payer: Medicaid Other | Source: Ambulatory Visit | Attending: Obstetrics and Gynecology | Admitting: Obstetrics and Gynecology

## 2021-01-24 ENCOUNTER — Other Ambulatory Visit: Payer: Self-pay

## 2021-01-24 DIAGNOSIS — R634 Abnormal weight loss: Secondary | ICD-10-CM | POA: Diagnosis not present

## 2021-01-24 DIAGNOSIS — N803 Endometriosis of pelvic peritoneum: Secondary | ICD-10-CM | POA: Diagnosis not present

## 2021-01-24 DIAGNOSIS — Z888 Allergy status to other drugs, medicaments and biological substances status: Secondary | ICD-10-CM | POA: Diagnosis not present

## 2021-01-24 DIAGNOSIS — K3184 Gastroparesis: Secondary | ICD-10-CM | POA: Diagnosis not present

## 2021-01-24 DIAGNOSIS — N939 Abnormal uterine and vaginal bleeding, unspecified: Secondary | ICD-10-CM | POA: Diagnosis not present

## 2021-01-24 DIAGNOSIS — Z793 Long term (current) use of hormonal contraceptives: Secondary | ICD-10-CM | POA: Diagnosis not present

## 2021-01-24 DIAGNOSIS — R102 Pelvic and perineal pain: Secondary | ICD-10-CM | POA: Diagnosis not present

## 2021-01-24 DIAGNOSIS — Z842 Family history of other diseases of the genitourinary system: Secondary | ICD-10-CM | POA: Diagnosis not present

## 2021-01-24 DIAGNOSIS — Z01812 Encounter for preprocedural laboratory examination: Secondary | ICD-10-CM | POA: Insufficient documentation

## 2021-01-24 DIAGNOSIS — Z8349 Family history of other endocrine, nutritional and metabolic diseases: Secondary | ICD-10-CM | POA: Diagnosis not present

## 2021-01-24 DIAGNOSIS — R110 Nausea: Secondary | ICD-10-CM | POA: Diagnosis not present

## 2021-01-24 DIAGNOSIS — M6289 Other specified disorders of muscle: Secondary | ICD-10-CM | POA: Diagnosis not present

## 2021-01-24 DIAGNOSIS — Z79899 Other long term (current) drug therapy: Secondary | ICD-10-CM | POA: Diagnosis not present

## 2021-01-24 DIAGNOSIS — K5900 Constipation, unspecified: Secondary | ICD-10-CM | POA: Diagnosis not present

## 2021-01-24 DIAGNOSIS — F909 Attention-deficit hyperactivity disorder, unspecified type: Secondary | ICD-10-CM | POA: Diagnosis not present

## 2021-01-24 DIAGNOSIS — F988 Other specified behavioral and emotional disorders with onset usually occurring in childhood and adolescence: Secondary | ICD-10-CM | POA: Diagnosis not present

## 2021-01-24 DIAGNOSIS — Z818 Family history of other mental and behavioral disorders: Secondary | ICD-10-CM | POA: Diagnosis not present

## 2021-01-24 DIAGNOSIS — Z880 Allergy status to penicillin: Secondary | ICD-10-CM | POA: Diagnosis not present

## 2021-01-24 DIAGNOSIS — Z8249 Family history of ischemic heart disease and other diseases of the circulatory system: Secondary | ICD-10-CM | POA: Diagnosis not present

## 2021-01-24 DIAGNOSIS — F319 Bipolar disorder, unspecified: Secondary | ICD-10-CM | POA: Diagnosis not present

## 2021-01-24 DIAGNOSIS — R109 Unspecified abdominal pain: Secondary | ICD-10-CM | POA: Diagnosis not present

## 2021-01-24 DIAGNOSIS — Z881 Allergy status to other antibiotic agents status: Secondary | ICD-10-CM | POA: Diagnosis not present

## 2021-01-24 DIAGNOSIS — Z833 Family history of diabetes mellitus: Secondary | ICD-10-CM | POA: Diagnosis not present

## 2021-01-24 DIAGNOSIS — Z820 Family history of epilepsy and other diseases of the nervous system: Secondary | ICD-10-CM | POA: Diagnosis not present

## 2021-01-24 DIAGNOSIS — K5909 Other constipation: Secondary | ICD-10-CM | POA: Diagnosis not present

## 2021-01-24 DIAGNOSIS — G8929 Other chronic pain: Secondary | ICD-10-CM | POA: Diagnosis not present

## 2021-01-24 DIAGNOSIS — R140 Abdominal distension (gaseous): Secondary | ICD-10-CM | POA: Diagnosis not present

## 2021-01-24 DIAGNOSIS — K219 Gastro-esophageal reflux disease without esophagitis: Secondary | ICD-10-CM | POA: Diagnosis not present

## 2021-01-24 LAB — TYPE AND SCREEN
ABO/RH(D): O NEG
Antibody Screen: NEGATIVE

## 2021-01-24 LAB — CBC
HCT: 38.9 % (ref 36.0–46.0)
Hemoglobin: 13.3 g/dL (ref 12.0–15.0)
MCH: 31.8 pg (ref 26.0–34.0)
MCHC: 34.2 g/dL (ref 30.0–36.0)
MCV: 93.1 fL (ref 80.0–100.0)
Platelets: 302 10*3/uL (ref 150–400)
RBC: 4.18 MIL/uL (ref 3.87–5.11)
RDW: 12.6 % (ref 11.5–15.5)
WBC: 6.5 10*3/uL (ref 4.0–10.5)
nRBC: 0 % (ref 0.0–0.2)

## 2021-01-25 ENCOUNTER — Ambulatory Visit (INDEPENDENT_AMBULATORY_CARE_PROVIDER_SITE_OTHER): Payer: Medicaid Other | Admitting: Clinical

## 2021-01-25 ENCOUNTER — Other Ambulatory Visit: Payer: Self-pay | Admitting: Gastroenterology

## 2021-01-25 DIAGNOSIS — F319 Bipolar disorder, unspecified: Secondary | ICD-10-CM

## 2021-01-25 DIAGNOSIS — Z658 Other specified problems related to psychosocial circumstances: Secondary | ICD-10-CM

## 2021-01-25 DIAGNOSIS — F909 Attention-deficit hyperactivity disorder, unspecified type: Secondary | ICD-10-CM

## 2021-01-25 DIAGNOSIS — F4310 Post-traumatic stress disorder, unspecified: Secondary | ICD-10-CM | POA: Diagnosis not present

## 2021-01-26 ENCOUNTER — Ambulatory Visit (HOSPITAL_BASED_OUTPATIENT_CLINIC_OR_DEPARTMENT_OTHER): Payer: Medicaid Other | Admitting: Certified Registered"

## 2021-01-26 ENCOUNTER — Ambulatory Visit (HOSPITAL_BASED_OUTPATIENT_CLINIC_OR_DEPARTMENT_OTHER)
Admission: RE | Admit: 2021-01-26 | Discharge: 2021-01-26 | Disposition: A | Discharge status: Home / Self Care | Payer: Medicaid Other | Attending: Obstetrics and Gynecology | Admitting: Obstetrics and Gynecology

## 2021-01-26 ENCOUNTER — Encounter (HOSPITAL_BASED_OUTPATIENT_CLINIC_OR_DEPARTMENT_OTHER)
Admission: RE | Disposition: A | Discharge status: Home / Self Care | Payer: Self-pay | Source: Home / Self Care | Attending: Obstetrics and Gynecology

## 2021-01-26 ENCOUNTER — Encounter (HOSPITAL_BASED_OUTPATIENT_CLINIC_OR_DEPARTMENT_OTHER): Payer: Self-pay | Admitting: Obstetrics and Gynecology

## 2021-01-26 ENCOUNTER — Other Ambulatory Visit: Payer: Self-pay

## 2021-01-26 DIAGNOSIS — N809 Endometriosis, unspecified: Secondary | ICD-10-CM | POA: Diagnosis not present

## 2021-01-26 DIAGNOSIS — Z8249 Family history of ischemic heart disease and other diseases of the circulatory system: Secondary | ICD-10-CM | POA: Insufficient documentation

## 2021-01-26 DIAGNOSIS — Z888 Allergy status to other drugs, medicaments and biological substances status: Secondary | ICD-10-CM | POA: Diagnosis not present

## 2021-01-26 DIAGNOSIS — Z842 Family history of other diseases of the genitourinary system: Secondary | ICD-10-CM | POA: Insufficient documentation

## 2021-01-26 DIAGNOSIS — Z793 Long term (current) use of hormonal contraceptives: Secondary | ICD-10-CM | POA: Insufficient documentation

## 2021-01-26 DIAGNOSIS — Z8349 Family history of other endocrine, nutritional and metabolic diseases: Secondary | ICD-10-CM | POA: Insufficient documentation

## 2021-01-26 DIAGNOSIS — Z79899 Other long term (current) drug therapy: Secondary | ICD-10-CM | POA: Insufficient documentation

## 2021-01-26 DIAGNOSIS — R102 Pelvic and perineal pain: Secondary | ICD-10-CM | POA: Insufficient documentation

## 2021-01-26 DIAGNOSIS — Z818 Family history of other mental and behavioral disorders: Secondary | ICD-10-CM | POA: Insufficient documentation

## 2021-01-26 DIAGNOSIS — N803 Endometriosis of pelvic peritoneum: Secondary | ICD-10-CM

## 2021-01-26 DIAGNOSIS — Z820 Family history of epilepsy and other diseases of the nervous system: Secondary | ICD-10-CM | POA: Diagnosis not present

## 2021-01-26 DIAGNOSIS — Z881 Allergy status to other antibiotic agents status: Secondary | ICD-10-CM | POA: Insufficient documentation

## 2021-01-26 DIAGNOSIS — Z833 Family history of diabetes mellitus: Secondary | ICD-10-CM | POA: Diagnosis not present

## 2021-01-26 DIAGNOSIS — J45909 Unspecified asthma, uncomplicated: Secondary | ICD-10-CM | POA: Diagnosis not present

## 2021-01-26 DIAGNOSIS — G8929 Other chronic pain: Secondary | ICD-10-CM | POA: Diagnosis not present

## 2021-01-26 DIAGNOSIS — Z880 Allergy status to penicillin: Secondary | ICD-10-CM | POA: Insufficient documentation

## 2021-01-26 DIAGNOSIS — F909 Attention-deficit hyperactivity disorder, unspecified type: Secondary | ICD-10-CM | POA: Diagnosis not present

## 2021-01-26 DIAGNOSIS — K219 Gastro-esophageal reflux disease without esophagitis: Secondary | ICD-10-CM | POA: Diagnosis not present

## 2021-01-26 HISTORY — DX: Other complications of anesthesia, initial encounter: T88.59XA

## 2021-01-26 HISTORY — PX: LAPAROSCOPY: SHX197

## 2021-01-26 LAB — POCT PREGNANCY, URINE: Preg Test, Ur: NEGATIVE

## 2021-01-26 SURGERY — LAPAROSCOPY, DIAGNOSTIC
Anesthesia: General | Site: Abdomen

## 2021-01-26 MED ORDER — MIDAZOLAM HCL 5 MG/5ML IJ SOLN
INTRAMUSCULAR | Status: DC | PRN
  Administered 2021-01-26: 2 mg via INTRAVENOUS

## 2021-01-26 MED ORDER — LACTATED RINGERS IV SOLN: INTRAVENOUS | Status: DC

## 2021-01-26 MED ORDER — OXYCODONE HCL 5 MG PO TABS
ORAL_TABLET | ORAL | Status: AC
  Filled 2021-01-26: qty 1

## 2021-01-26 MED ORDER — HYDROMORPHONE HCL 1 MG/ML IJ SOLN: 0.2500 mg | INTRAMUSCULAR | Status: DC | PRN

## 2021-01-26 MED ORDER — IBUPROFEN 600 MG PO TABS: 600.0000 mg | ORAL_TABLET | Freq: Four times a day (QID) | ORAL | 2 refills | Status: DC | PRN

## 2021-01-26 MED ORDER — OXYCODONE HCL 5 MG PO TABS: 5.0000 mg | ORAL_TABLET | Freq: Four times a day (QID) | ORAL | 0 refills | Status: AC | PRN

## 2021-01-26 MED ORDER — ONDANSETRON HCL 4 MG/2ML IJ SOLN
INTRAMUSCULAR | Status: DC | PRN
  Administered 2021-01-26: 4 mg via INTRAVENOUS

## 2021-01-26 MED ORDER — EPHEDRINE SULFATE 50 MG/ML IJ SOLN: INTRAMUSCULAR | Status: DC | PRN

## 2021-01-26 MED ORDER — ROCURONIUM BROMIDE 10 MG/ML (PF) SYRINGE
PREFILLED_SYRINGE | INTRAVENOUS | Status: AC
  Filled 2021-01-26: qty 10

## 2021-01-26 MED ORDER — SODIUM CHLORIDE 0.9 % IR SOLN
Status: DC | PRN
  Administered 2021-01-26: 1000 mL

## 2021-01-26 MED ORDER — FENTANYL CITRATE (PF) 100 MCG/2ML IJ SOLN
INTRAMUSCULAR | Status: DC | PRN
  Administered 2021-01-26: 100 ug via INTRAVENOUS

## 2021-01-26 MED ORDER — PROMETHAZINE HCL 25 MG/ML IJ SOLN
6.2500 mg | INTRAMUSCULAR | Status: DC | PRN
Start: 2021-01-26 — End: 2021-01-26

## 2021-01-26 MED ORDER — BUPIVACAINE HCL 0.5 % IJ SOLN
INTRAMUSCULAR | Status: DC | PRN
  Administered 2021-01-26: 7 mL

## 2021-01-26 MED ORDER — PROPOFOL 10 MG/ML IV BOLUS
INTRAVENOUS | Status: DC | PRN
  Administered 2021-01-26: 110 mg via INTRAVENOUS

## 2021-01-26 MED ORDER — SUGAMMADEX SODIUM 200 MG/2ML IV SOLN
INTRAVENOUS | Status: DC | PRN
  Administered 2021-01-26: 180 mg via INTRAVENOUS

## 2021-01-26 MED ORDER — PROMETHAZINE HCL 12.5 MG PO TABS
12.5000 mg | ORAL_TABLET | Freq: Four times a day (QID) | ORAL | 0 refills | Status: DC | PRN
Start: 2021-01-26 — End: 2021-06-30

## 2021-01-26 MED ORDER — PROPOFOL 10 MG/ML IV BOLUS
INTRAVENOUS | Status: AC
  Filled 2021-01-26: qty 20

## 2021-01-26 MED ORDER — MIDAZOLAM HCL 2 MG/2ML IJ SOLN
INTRAMUSCULAR | Status: AC
  Filled 2021-01-26: qty 2

## 2021-01-26 MED ORDER — DEXAMETHASONE SODIUM PHOSPHATE 10 MG/ML IJ SOLN
INTRAMUSCULAR | Status: AC
  Filled 2021-01-26: qty 1

## 2021-01-26 MED ORDER — PHENYLEPHRINE 40 MCG/ML (10ML) SYRINGE FOR IV PUSH (FOR BLOOD PRESSURE SUPPORT)
PREFILLED_SYRINGE | INTRAVENOUS | Status: AC
  Filled 2021-01-26: qty 10

## 2021-01-26 MED ORDER — OXYCODONE HCL 5 MG PO TABS
5.0000 mg | ORAL_TABLET | Freq: Once | ORAL | Status: AC | PRN
  Administered 2021-01-26: 5 mg via ORAL

## 2021-01-26 MED ORDER — ONDANSETRON HCL 4 MG/2ML IJ SOLN
INTRAMUSCULAR | Status: AC
  Filled 2021-01-26: qty 2

## 2021-01-26 MED ORDER — OXYCODONE HCL 5 MG/5ML PO SOLN: 5.0000 mg | Freq: Once | ORAL | Status: AC | PRN

## 2021-01-26 MED ORDER — LIDOCAINE 2% (20 MG/ML) 5 ML SYRINGE
INTRAMUSCULAR | Status: AC
  Filled 2021-01-26: qty 5

## 2021-01-26 MED ORDER — KETOROLAC TROMETHAMINE 30 MG/ML IJ SOLN
INTRAMUSCULAR | Status: AC
  Filled 2021-01-26: qty 1

## 2021-01-26 MED ORDER — CLINDAMYCIN PHOSPHATE 900 MG/50ML IV SOLN
INTRAVENOUS | Status: AC
  Filled 2021-01-26: qty 50

## 2021-01-26 MED ORDER — MEPERIDINE HCL 25 MG/ML IJ SOLN: 6.2500 mg | INTRAMUSCULAR | Status: DC | PRN

## 2021-01-26 MED ORDER — FENTANYL CITRATE (PF) 100 MCG/2ML IJ SOLN
INTRAMUSCULAR | Status: AC
  Filled 2021-01-26: qty 2

## 2021-01-26 MED ORDER — PHENYLEPHRINE HCL (PRESSORS) 10 MG/ML IV SOLN
INTRAVENOUS | Status: DC | PRN
  Administered 2021-01-26 (×3): 40 ug via INTRAVENOUS
  Administered 2021-01-26: 80 ug via INTRAVENOUS

## 2021-01-26 MED ORDER — AMISULPRIDE (ANTIEMETIC) 5 MG/2ML IV SOLN: 10.0000 mg | Freq: Once | INTRAVENOUS | Status: DC | PRN

## 2021-01-26 MED ORDER — DEXAMETHASONE SODIUM PHOSPHATE 10 MG/ML IJ SOLN
INTRAMUSCULAR | Status: DC | PRN
  Administered 2021-01-26: 5 mg via INTRAVENOUS

## 2021-01-26 MED ORDER — CLINDAMYCIN PHOSPHATE 900 MG/50ML IV SOLN
INTRAVENOUS | Status: DC | PRN
  Administered 2021-01-26: 900 mg via INTRAVENOUS

## 2021-01-26 MED ORDER — POVIDONE-IODINE 10 % EX SWAB
2.0000 "application " | Freq: Once | CUTANEOUS | Status: AC
  Administered 2021-01-26: 2 via TOPICAL

## 2021-01-26 MED ORDER — ROCURONIUM BROMIDE 100 MG/10ML IV SOLN
INTRAVENOUS | Status: DC | PRN
  Administered 2021-01-26: 40 mg via INTRAVENOUS

## 2021-01-26 MED ORDER — KETOROLAC TROMETHAMINE 30 MG/ML IJ SOLN
INTRAMUSCULAR | Status: DC | PRN
  Administered 2021-01-26: 20 mg via INTRAVENOUS

## 2021-01-26 MED ORDER — LIDOCAINE 2% (20 MG/ML) 5 ML SYRINGE
INTRAMUSCULAR | Status: DC | PRN
  Administered 2021-01-26: 40 mg via INTRAVENOUS

## 2021-01-26 SURGICAL SUPPLY — 32 items
APPLICATOR ARISTA FLEXITIP XL (MISCELLANEOUS) IMPLANT
DERMABOND ADVANCED (GAUZE/BANDAGES/DRESSINGS) ×1
DERMABOND ADVANCED .7 DNX12 (GAUZE/BANDAGES/DRESSINGS) ×1 IMPLANT
DRSG OPSITE POSTOP 3X4 (GAUZE/BANDAGES/DRESSINGS) ×2 IMPLANT
DURAPREP 26ML APPLICATOR (WOUND CARE) ×2 IMPLANT
GLOVE SRG 8 PF TXTR STRL LF DI (GLOVE) ×1 IMPLANT
GLOVE SURG ENC MOIS LTX SZ8 (GLOVE) ×2 IMPLANT
GLOVE SURG UNDER POLY LF SZ7 (GLOVE) ×4 IMPLANT
GLOVE SURG UNDER POLY LF SZ8 (GLOVE) ×2
GOWN STRL REUS W/ TWL LRG LVL3 (GOWN DISPOSABLE) ×1 IMPLANT
GOWN STRL REUS W/ TWL XL LVL3 (GOWN DISPOSABLE) ×1 IMPLANT
GOWN STRL REUS W/TWL LRG LVL3 (GOWN DISPOSABLE) ×2
GOWN STRL REUS W/TWL XL LVL3 (GOWN DISPOSABLE) ×2
HEMOSTAT ARISTA ABSORB 3G PWDR (HEMOSTASIS) IMPLANT
KIT TURNOVER KIT B (KITS) ×2 IMPLANT
LIGASURE VESSEL 5MM BLUNT TIP (ELECTROSURGICAL) ×2 IMPLANT
NS IRRIG 1000ML POUR BTL (IV SOLUTION) ×2 IMPLANT
PACK LAPAROSCOPY BASIN (CUSTOM PROCEDURE TRAY) ×2 IMPLANT
PACK TRENDGUARD 450 HYBRID PRO (MISCELLANEOUS) ×1 IMPLANT
POUCH SPECIMEN RETRIEVAL 10MM (ENDOMECHANICALS) IMPLANT
SET IRRIG TUBING LAPAROSCOPIC (IRRIGATION / IRRIGATOR) IMPLANT
SET TUBE SMOKE EVAC HIGH FLOW (TUBING) ×2 IMPLANT
SLEEVE ADV FIXATION 5X100MM (TROCAR) ×2 IMPLANT
SLEEVE XCEL OPT CAN 5 100 (ENDOMECHANICALS) ×2 IMPLANT
SUT MNCRL AB 4-0 PS2 18 (SUTURE) ×2 IMPLANT
SUT VICRYL 0 UR6 27IN ABS (SUTURE) ×2 IMPLANT
TOWEL GREEN STERILE FF (TOWEL DISPOSABLE) ×4 IMPLANT
TRAY FOLEY W/BAG SLVR 14FR LF (SET/KITS/TRAYS/PACK) ×2 IMPLANT
TRENDGUARD 450 HYBRID PRO PACK (MISCELLANEOUS) ×2
TROCAR BALLN 12MMX100 BLUNT (TROCAR) ×2 IMPLANT
TROCAR XCEL NON-BLD 5MMX100MML (ENDOMECHANICALS) ×2 IMPLANT
WARMER LAPAROSCOPE (MISCELLANEOUS) ×2 IMPLANT

## 2021-01-26 NOTE — Op Note (Signed)
Rachel [name] PROCEDURE DATE: 01/26/2021  PREOPERATIVE DIAGNOSIS: Chronic pelvic pain POSTOPERATIVE DIAGNOSIS: chronic pelvic pain, endometriosis PROCEDURE: Diagnostic laparoscopy SURGEON:  Dr. Lynnda Shields ASSISTANT: n/a  INDICATIONS: 35 y.o. G0P0000 with history of chronic pelvic pain concerning for endometriosis desiring surgical evaluation.   Please see preoperative notes for further details.  Risks of surgery were discussed with the patient including but not limited to: bleeding which may require transfusion or reoperation; infection which may require antibiotics; injury to bowel, bladder, ureters or other surrounding organs; need for additional procedures including laparotomy; thromboembolic phenomenon, incisional problems and other postoperative/anesthesia complications. Written informed consent was obtained.    FINDINGS:  Small uterus, normal ovaries and fallopian tubes bilaterally.  There is diffuse evidence of chocolate colored endometriosis lesions in the posterior cul-de-sac, uterosacral region and on the bladder peritoneum.  Sites were not in optimal locations for peritoneal biopsy , so this was deferred. No other abdominal/pelvic abnormality.  Normal upper abdomen.  ANESTHESIA:    General INTRAVENOUS FLUIDS: 1300 ml ESTIMATED BLOOD LOSS: 10 ml URINE OUTPUT: 50 ml SPECIMENS: noneCOMPLICATIONS: None immediate  PROCEDURE IN DETAIL:  The patient had sequential compression devices applied to her lower extremities while in the preoperative area.  She was then taken to the operating room where general anesthesia was administered and was found to be adequate.  She was placed in the dorsal lithotomy position, and was prepped and draped in a sterile manner.  A Foley catheter was inserted into her bladder and attached to constant drainage and a uterine manipulator was then advanced into the uterus .  After an adequate timeout was performed, attention was turned to the abdomen where an  umbilical incision was made with the scalpel.  The abdomen was entered in the open technique and a 33mm port was placed and secured.  The abdomen was then insufflated with carbon dioxide gas and adequate pneumoperitoneum was obtained.   A detailed survey of the patient's pelvis and abdomen revealed the findings as mentioned above.  No intraoperative injury to surrounding organs was noted.  The abdomen was desufflated and all instruments were then removed from the patient's abdomen. The uterine manipulator was removed without complications.  All incisions were closed with Dermabond and 4-0 monocryl. The patient tolerated the procedures well.  All instruments, needles, and sponge counts were correct x 2. The patient was taken to the recovery room in stable condition.     Lynnda Shields, MD, Rachel [name] Attending Rachel [name], [facility]

## 2021-01-26 NOTE — Anesthesia Preprocedure Evaluation (Signed)
Anesthesia Evaluation  Patient identified by MRN, date of birth, ID band Patient awake    Reviewed: Allergy & Precautions, NPO status , Patient's Chart, lab work & pertinent test results  Airway Mallampati: II  TM Distance: >3 FB Neck ROM: Full    Dental no notable dental hx.    Pulmonary asthma ,    Pulmonary exam normal breath sounds clear to auscultation       Cardiovascular negative cardio ROS Normal cardiovascular exam Rhythm:Regular Rate:Normal     Neuro/Psych Anxiety Depression Bipolar Disorder negative neurological ROS  negative psych ROS   GI/Hepatic Neg liver ROS, GERD  ,  Endo/Other  negative endocrine ROS  Renal/GU negative Renal ROS  negative genitourinary   Musculoskeletal negative musculoskeletal ROS (+)   Abdominal   Peds negative pediatric ROS (+)  Hematology negative hematology ROS (+)   Anesthesia Other Findings   Reproductive/Obstetrics negative OB ROS                             Anesthesia Physical Anesthesia Plan  ASA: II  Anesthesia Plan: General   Post-op Pain Management:    Induction: Intravenous  PONV Risk Score and Plan: 3 and Ondansetron, Dexamethasone, Midazolam and Treatment may vary due to age or medical condition  Airway Management Planned: Oral ETT  Additional Equipment:   Intra-op Plan:   Post-operative Plan: Extubation in OR  Informed Consent: I have reviewed the patients History and Physical, chart, labs and discussed the procedure including the risks, benefits and alternatives for the proposed anesthesia with the patient or authorized representative who has indicated his/her understanding and acceptance.     Dental advisory given  Plan Discussed with: CRNA  Anesthesia Plan Comments:         Anesthesia Quick Evaluation

## 2021-01-26 NOTE — H&P (Signed)
OB/GYN Pre-Op History and Physical  Dewey Neukam is a 35 y.o. G0P0000 presenting for diagnostic laparoscopy for chronic pelvic pain. She understands we will be looking for any potential sources of pain.  Risks and benefits of the procedure include bleeding, infection, involvement of other organs especially bladder and bowel.      Past Medical History:  Diagnosis Date  . ADHD (attention deficit hyperactivity disorder)   . Anemia   . Anxiety   . Asthma   . Bipolar disorder ([place])   . Complication of anesthesia    slow to wake up   . Delayed gastric emptying 02/23/2020  . Depression   . DUB (dysfunctional uterine bleeding) 02/23/2020  . GERD (gastroesophageal reflux disease)   . Hiatal hernia 04/06/2020  . Myalgia 04/06/2020  . Parasitic infection 02/23/2020  . Poor appetite 02/23/2020  . RUQ pain 02/23/2020  . Vitamin D deficiency   . Weight loss 02/23/2020    Past Surgical History:  Procedure Laterality Date  . barium study    . COLONOSCOPY    . ESOPHAGOGASTRODUODENOSCOPY  2011   and a ph study as well.   [name] Kitchen NASAL SEPTUM SURGERY    . TONGUE FLAP    . WISDOM TOOTH EXTRACTION      OB History  Gravida Para Term Preterm AB Living  0 0 0 0 0 0  SAB IAB Ectopic Multiple Live Births  0 0 0 0 0    Social History   Socioeconomic History  . Marital status: Divorced    Spouse name: Not on file  . Number of children: Not on file  . Years of education: Not on file  . Highest education level: Not on file  Occupational History  . Not on file  Tobacco Use  . Smoking status: Never Smoker  . Smokeless tobacco: Never Used  Vaping Use  . Vaping Use: Never used  Substance and Sexual Activity  . Alcohol use: No  . Drug use: No  . Sexual activity: Not Currently  Other Topics Concern  . Not on file  Social History Narrative  . Not on file   Social Determinants of Health   Financial Resource Strain: Not on file  Food Insecurity: Not on file  Transportation Needs: Not  on file  Physical Activity: Not on file  Stress: Not on file  Social Connections: Not on file    Family History  Problem Relation Age of Onset  . Depression Mother   . Anxiety disorder Mother   . Hypertension Mother   . Hyperlipidemia Mother   . Asthma Mother   . Endometriosis Mother   . Fibroids Mother   . Fibromyalgia Mother   . Migraines Mother   . Depression Father   . Hypertension Father   . Hyperlipidemia Father   . Bipolar disorder Father   . Bipolar disorder Sister   . ADD / ADHD Sister   . Asthma Sister   . Endometriosis Sister   . Endometriosis Maternal Grandmother   . Fibroids Maternal Grandmother   . Hyperlipidemia Maternal Grandmother   . Hypertension Maternal Grandmother   . Diabetes Maternal Grandmother   . Fibromyalgia Maternal Grandmother   . Seizures Sister   . Bipolar disorder Sister   . Anxiety disorder Sister     Medications Prior to Admission  Medication Sig Dispense Refill Last Dose  . acetaminophen (TYLENOL) 500 MG tablet Take 1,000 mg by mouth every 6 (six) hours as needed for headache (pain).   01/25/2021  at Unknown time  . albuterol (VENTOLIN HFA) 108 (90 Base) MCG/ACT inhaler Inhale 1-2 puffs into the lungs every 6 (six) hours as needed for wheezing or shortness of breath. 8 g 6 Past Week at Unknown time  . cholecalciferol (VITAMIN D3) 25 MCG (1000 UNIT) tablet Take 1,000 Units by mouth daily.   Past Week at Unknown time  . fexofenadine (ALLEGRA) 180 MG tablet Take 180 mg by mouth daily.   01/25/2021 at Unknown time  . LINZESS 290 MCG CAPS capsule TAKE ONE CAPSULE BY MOUTH DAILY BEFORE BREAKFAST 30 capsule 3 01/26/2021 at 0600  . MELATONIN PO Take by mouth at bedtime.   01/25/2021 at Unknown time  . naproxen sodium (ALEVE) 220 MG tablet Take 220 mg by mouth as needed.    Past Month at Unknown time  . Norethindrone Acetate-Ethinyl Estrad-FE (LOESTRIN 24 FE) 1-20 MG-MCG(24) tablet Take 1 tablet by mouth daily. 28 tablet 11 01/25/2021 at Unknown time   . polyethylene glycol (MIRALAX / GLYCOLAX) 17 g packet Take 17 g by mouth daily. 1 capful daily   01/25/2021 at Unknown time  . senna (SENOKOT) 8.6 MG tablet Take 1 tablet by mouth daily.   01/25/2021 at Unknown time  . triamcinolone (KENALOG) 0.1 % Apply 1 application topically 2 (two) times daily. 45 g 1 01/19/2021 at Unknown time  . vitamin C (ASCORBIC ACID) 500 MG tablet Take 500 mg by mouth daily.   Past Week at Unknown time    Allergies  Allergen Reactions  . Amphetamine-Dextroamphetamine Other (See Comments)    Unknown reaction  . Penicillins Swelling    Severe allergy per allergy test Did it involve swelling of the face/tongue/throat, SOB, or low BP? Yes Did it involve sudden or severe rash/hives, skin peeling, or any reaction on the inside of your mouth or nose? Unknown Did you need to seek medical attention at a hospital or doctor's office? Unknown When did it last happen?teenager If all above answers are "NO", may proceed with cephalosporin use.  [name] Kitchen Doxycycline Itching, Nausea And Vomiting and Other (See Comments)    Caused high fever  . Olanzapine Other (See Comments)    Unknown reaction  . Other     Stimulants cause unknown reaction  . Prozac [Fluoxetine Hcl] Other (See Comments)    Unknown reaction  . Reglan [Metoclopramide]     Causes her bipolar issues to trigger and muscle twitching and stiffness     Review of Systems: Negative except for what is mentioned in HPI.     Physical Exam: BP 126/86   Pulse 81   Temp 98.6 F (37 C) (Oral)   Resp 16   Ht 5' 2.5" (1.588 m)   Wt 44.7 kg   SpO2 100%   BMI 17.74 kg/m  CONSTITUTIONAL: Well-developed, well-nourished female in no acute distress.  HENT:  Normocephalic, atraumatic, External right and left ear normal. Oropharynx is clear and moist EYES: Conjunctivae and EOM are normal.  No scleral icterus.  NECK: Normal range of motion, supple, no masses SKIN: Skin is warm and dry. No rash noted. Not diaphoretic. No  erythema. No pallor. [place]: Alert and oriented to person, place, and time. Normal reflexes, muscle tone coordination. No cranial nerve deficit noted. PSYCHIATRIC: Normal mood and affect. Normal behavior. Normal judgment and thought content. CARDIOVASCULAR: Normal heart rate noted, regular rhythm RESPIRATORY: Effort and breath sounds normal, no problems with respiration noted ABDOMEN: Soft, mildly tender, nondistended,  PELVIC: Deferred MUSCULOSKELETAL: Normal range of motion. No edema  and no tenderness. 2+ distal pulses.   Pertinent Labs/Studies:   Results for orders placed or performed during the hospital encounter of 01/26/21 (from the past 72 hour(s))  Type and screen [facility]     Status: None   Collection Time: 01/24/21 11:45 AM  Result Value Ref Range   ABO/RH(D) O NEG    Antibody Screen NEG    Sample Expiration      01/27/2021,2359 Performed at [facility], [place] [address], [place], [place] [address]   Pregnancy, urine POC     Status: None   Collection Time: 01/26/21 11:29 AM  Result Value Ref Range   Preg Test, Ur NEGATIVE NEGATIVE    Comment:        THE SENSITIVITY OF THIS METHODOLOGY IS >24 mIU/mL        Assessment and Plan :Kailiana Granquist is a 35 y.o. G0P0000 here for diagnostic laparoscopy for pelvic pain.   Plan for diagnostic scope with possible peritoneal biopsies NPO Risks and benefits given.  Consent has been signed.  Lynnda Shields, M.D. Attending [place], [facility] for [employer], [place]

## 2021-01-26 NOTE — Anesthesia Postprocedure Evaluation (Signed)
Anesthesia Post Note  Patient: Rachel [name]  Procedure(s) Performed: LAPAROSCOPY DIAGNOSTIC (N/A Abdomen)     Patient location during evaluation: PACU Anesthesia Type: General Level of consciousness: awake and alert Pain management: pain level controlled Vital Signs Assessment: post-procedure vital signs reviewed and stable Respiratory status: spontaneous breathing, nonlabored ventilation and respiratory function stable Cardiovascular status: blood pressure returned to baseline and stable Postop Assessment: no apparent nausea or vomiting Anesthetic complications: no   No complications documented.  Last Vitals:  Vitals:   01/26/21 1440 01/26/21 1515  BP: (!) 128/93 (!) 135/95  Pulse:  90  Resp: 15 14  Temp:  (!) 36.3 C  SpO2: 100% 100%    Last Pain:  Vitals:   01/26/21 1511  TempSrc:   PainSc: [place]

## 2021-01-26 NOTE — Transfer of Care (Signed)
Immediate Anesthesia Transfer of Care Note  Patient: Rachel [name]  Procedure(s) Performed: LAPAROSCOPY DIAGNOSTIC (N/A Abdomen)  Patient Location: PACU  Anesthesia Type:General  Level of Consciousness: drowsy  Airway & Oxygen Therapy: Patient Spontanous Breathing and Patient connected to face mask oxygen  Post-op Assessment: Report given to RN and Post -op Vital signs reviewed and stable  Post vital signs: Reviewed and stable  Last Vitals:  Vitals Value Taken Time  BP 122/76 01/26/21 1419  Temp    Pulse 76 01/26/21 1420  Resp 16 01/26/21 1420  SpO2 100 % 01/26/21 1420  Vitals shown include unvalidated device data.  Last Pain:  Vitals:   01/26/21 1200  TempSrc: Oral  PainSc: 8       Patients Stated Pain Goal: 5 (39/67/28 9791)  Complications: No complications documented.

## 2021-01-26 NOTE — Discharge Instructions (Signed)
Diagnostic Laparoscopy, Care After The following information offers guidance on how to care for yourself after your procedure. Your health care provider may also give you more specific instructions. If you have problems or questions, contact your health care provider. What can I expect after the procedure? After the procedure, it is common to have:  Mild discomfort in the abdomen.  Sore throat. Women who have laparoscopy with a pelvic examination may have mild cramping and fluid coming from the vagina for a few days after the procedure. Follow these instructions at home: Medicines  Take over-the-counter and prescription medicines only as told by your health care provider.  If you were prescribed an antibiotic medicine, take it as told by your health care provider. Do not stop taking the antibiotic even if you start to feel better.  Ask your health care provider if the medicine prescribed to you: ? Requires you to avoid driving or using machinery. ? Can cause constipation. You may need to take these actions to prevent or treat constipation:  Drink enough fluid to keep your urine pale yellow.  Take over-the-counter or prescription medicines.  Eat foods that are high in fiber, such as beans, whole grains, and fresh fruits and vegetables.  Limit foods that are high in fat and processed sugars, such as fried or sweet foods. Incision care  Follow instructions from your health care provider about how to take care of your incisions. Make sure you: ? Wash your hands with soap and water for at least 20 seconds before and after you change your bandage (dressing). If soap and water are not available, use hand sanitizer. ? Change your dressing as told by your health care provider. ? Leave stitches (sutures), skin glue, or surgical tape in place. These skin closures may need to stay in place for 2 weeks or longer. If surgical tape edges start to loosen and curl up, you may trim the loose edges. Do  not remove the surgical tape completely unless your health care provider tells you to do that.  Check your incision areas every day for signs of infection. Check for: ? Redness, swelling, or pain. ? Fluid or blood. ? Warmth. ? Pus or a bad smell.   Activity  Return to your normal activities as told by your health care provider. Ask your health care provider what activities are safe for you.  Do not lift anything that is heavier than 10 lb (4.5 kg), or the limit that you are told, until your health care provider says that it is safe.  Avoid sitting for a long time without moving. Get up to take short walks every 1-2 hours. This is important to improve blood flow and breathing. Ask for help if you feel weak or unsteady. General instructions  Do not use any products that contain nicotine or tobacco. These products include cigarettes, chewing tobacco, and vaping devices, such as e-cigarettes. If you need help quitting, ask your health care provider.  If you were given a sedative during the procedure, it can affect you for several hours. Do not drive or operate machinery until your health care provider says that it is safe.  Do not take baths, swim, or use a hot tub until your health care provider approves. Ask your health care provider if you may take showers. You may only be allowed to take sponge baths.  Keep all follow-up visits. This is important. Contact a health care provider if:  You develop shoulder pain.  You feel light-headed or  faint.  You are unable to pass gas or have a bowel movement.  You feel nauseous or you vomit.  You develop a rash.  You have any of these signs of infection: ? Redness, swelling, or pain around an incision. ? Fluid or blood coming from an incision. ? Warmth coming from an incision. ? Pus or a bad smell coming from an incision. ? A fever or chills. Get help right away if:  You have severe pain.  You have vomiting that does not go away.  You  have heavy bleeding from the vagina.  Any incision opens up.  You have trouble breathing.  You have chest pain. These symptoms may represent a serious problem that is an emergency. Do not wait to see if the symptoms will go away. Get medical help right away. Call your local emergency services (911 in the [place] Do not drive yourself to the hospital. Summary  After the procedure, it is common to have mild discomfort in the abdomen and a sore throat.  Check your incision areas every day for signs of infection.  Return to your normal activities as told by your health care provider. Ask your health care provider what activities are safe for you. This information is not intended to replace advice given to you by your health care provider. Make sure you discuss any questions you have with your health care provider. Document Revised: 06/25/2020 Document Reviewed: 06/25/2020 Elsevier Patient Education  2021 [employer]. Endometriosis  Endometriosis is a condition in which a tissue similar to the endometrium grows in places outside the uterus. The endometrium is a tissue that forms the lining of the uterus. This tissue can grow in the organs that create the eggs (ovaries), the tubes that carry the eggs to the uterus (fallopian tubes), the vagina, and the bowel. This tissue most often grows on the ovaries and inner lining of the pelvic cavity (peritoneum). When the uterus sheds the endometrium every menstrual cycle, there is bleeding wherever these types of tissue are located. This can cause pain because blood is irritating to tissues that are not normally exposed to it. Endometriosis can also make it harder for a woman to get pregnant. What are the causes? The cause of this condition is not known. What increases the risk? The following factors may make you more likely to develop this condition:  Having a family history of endometriosis.  Having never given birth.  Starting your period at 34  years of age or younger. What are the signs or symptoms? Often, there are no symptoms of this condition. If you do have symptoms, they may:  Vary depending on where the abnormal tissue is growing.  Occur during your menstrual period (most often) or at the middle of your cycle.  Come and go. You may have no symptoms during some months.  Stop when you no longer have your monthly periods (menopause). Symptoms may include:  Pain in the area between your hip bones (pelvis).  Heavier bleeding during periods.  Menstrual periods that happen more than once a month.  Pain during sex.  Pain in the back or abdomen.  Painful bowel movements.  Not being able to get pregnant. How is this diagnosed? This condition is diagnosed based on your symptoms and a physical exam. You may have tests, such as:  Blood tests and urine tests to help rule out other causes.  Ultrasound to look for tissues that are not normal. This is often done over your skin. It  is sometimes done through the vagina (transvaginal).  X-ray of the lower bowel (barium enema).  CT scan.  MRI. To confirm the diagnosis, your health care provider may use a device with a small camera to check tissue inside your abdomen (laparoscopy). Abnormal tissue may be removed and checked in a lab (biopsy). How is this treated? There is no cure for this condition. Treatment focuses on controlling your symptoms. The type of treatment also depends on whether you want to become pregnant in the future. This condition may be treated with:  Medicines. These may include: ? Medicines to relieve pain, including NSAIDs such as ibuprofen. ? Hormone therapy. This uses artificial hormones to slow the growth of the abnormal tissue. This may include hormonal birth control, such as pills.  Surgery to remove the abnormal tissue. During surgery: ? Tissue may be removed using a laparoscope and a laser (laparoscopic laser treatment). ? The fallopian tubes,  uterus, and ovaries may be removed (hysterectomy). This is done in very severe cases. Follow these instructions at home:  Get regular exercise.  Limit alcohol use.  Eat a balanced diet.  Avoid caffeine.  Take over-the-counter and prescription medicines only as told by your health care provider.  Keep all follow-up visits as told by your health care provider. This is important. Where to find more information  [employer]: EgoNews.co.uk  Office on Women's Health: ChromeDoors.com.ee Contact a health care provider if:  You are having new pain or trouble controlling pain.  You have problems getting pregnant.  You have a fever. Get help right away if you have:  Severe pain that does not get better with medicine.  Severe nausea and vomiting, or if you cannot eat or drink without vomiting.  Pain that affects your abdomen only on the lower, right side.  Pain in your abdomen that gets worse.  Swelling in your abdomen.  Blood in your stool (feces). Summary  Endometriosis is a condition in which a tissue similar to the endometrium grows in places outside the uterus. The endometrium is a tissue that forms the lining of the uterus.  The cause of this condition is not known.  This condition may be treated with medicines to relieve pain, hormone therapy, or surgery.  If you have this condition, get regular exercise, limit alcohol use, and avoid caffeine.  Get help right away if you have severe pain that does not get better with medicine, or if you have severe nausea and vomiting or blood in your stool. This information is not intended to replace advice given to you by your health care provider. Make sure you discuss any questions you have with your health care provider. Document Revised: 12/17/2019 Document Reviewed: 12/17/2019 Elsevier Patient Education  2021 [place].  May take Ibuprofen after 2pm, if needed.     Post Anesthesia Home Care Instructions  Activity: Get plenty of rest for the remainder of the day. A responsible individual must stay with you for 24 hours following the procedure.  For the next 24 hours, DO NOT: -Drive a car -Paediatric nurse -Drink alcoholic beverages -Take any medication unless instructed by your physician -Make any legal decisions or sign important papers.  Meals: Start with liquid foods such as gelatin or soup. Progress to regular foods as tolerated. Avoid greasy, spicy, heavy foods. If nausea and/or vomiting occur, drink only clear liquids until the nausea and/or vomiting subsides. Call your physician if vomiting continues.  Special Instructions/Symptoms: Your throat may feel dry  or sore from the anesthesia or the breathing tube placed in your throat during surgery. If this causes discomfort, gargle with warm salt water. The discomfort should disappear within 24 hours.  If you had a scopolamine patch placed behind your ear for the management of post- operative nausea and/or vomiting:  1. The medication in the patch is effective for 72 hours, after which it should be removed.  Wrap patch in a tissue and discard in the trash. Wash hands thoroughly with soap and water. 2. You may remove the patch earlier than 72 hours if you experience unpleasant side effects which may include dry mouth, dizziness or visual disturbances. 3. Avoid touching the patch. Wash your hands with soap and water after contact with the patch.

## 2021-01-26 NOTE — Anesthesia Procedure Notes (Signed)
Procedure Name: Intubation Date/Time: 01/26/2021 1:09 PM Performed by: Lavonia Dana, CRNA Pre-anesthesia Checklist: Patient identified, Emergency Drugs available, Suction available and Patient being monitored Patient Re-evaluated:Patient Re-evaluated prior to induction Oxygen Delivery Method: Circle system utilized Preoxygenation: Pre-oxygenation with 100% oxygen Induction Type: IV induction Ventilation: Mask ventilation without difficulty Laryngoscope Size: Miller and 2 Grade View: Grade I Tube type: Oral Tube size: 6.5 mm Number of attempts: 1 Airway Equipment and Method: Stylet and Bite block Placement Confirmation: ETT inserted through vocal cords under direct vision,  positive ETCO2 and breath sounds checked- equal and bilateral Secured at: 21 cm Tube secured with: Tape Dental Injury: Teeth and Oropharynx as per pre-operative assessment

## 2021-01-28 ENCOUNTER — Encounter (HOSPITAL_BASED_OUTPATIENT_CLINIC_OR_DEPARTMENT_OTHER): Payer: Self-pay | Admitting: Obstetrics and Gynecology
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
## 2021-02-11 ENCOUNTER — Ambulatory Visit (INDEPENDENT_AMBULATORY_CARE_PROVIDER_SITE_OTHER): Payer: Medicaid Other | Admitting: Clinical

## 2021-02-11 DIAGNOSIS — F319 Bipolar disorder, unspecified: Secondary | ICD-10-CM

## 2021-02-11 DIAGNOSIS — F4310 Post-traumatic stress disorder, unspecified: Secondary | ICD-10-CM | POA: Diagnosis not present

## 2021-02-11 DIAGNOSIS — F909 Attention-deficit hyperactivity disorder, unspecified type: Secondary | ICD-10-CM

## 2021-02-14 ENCOUNTER — Encounter: Payer: Self-pay | Admitting: General Practice

## 2021-02-16 ENCOUNTER — Ambulatory Visit (INDEPENDENT_AMBULATORY_CARE_PROVIDER_SITE_OTHER): Payer: Medicaid Other | Admitting: Obstetrics and Gynecology

## 2021-02-16 ENCOUNTER — Encounter: Payer: Self-pay | Admitting: Obstetrics and Gynecology

## 2021-02-16 ENCOUNTER — Other Ambulatory Visit: Payer: Self-pay

## 2021-02-16 VITALS — BP 115/79 | HR 61 | Ht 63.0 in | Wt 98.8 lb

## 2021-02-16 DIAGNOSIS — Z4889 Encounter for other specified surgical aftercare: Secondary | ICD-10-CM | POA: Diagnosis not present

## 2021-02-16 DIAGNOSIS — N938 Other specified abnormal uterine and vaginal bleeding: Secondary | ICD-10-CM | POA: Diagnosis not present

## 2021-02-16 DIAGNOSIS — N809 Endometriosis, unspecified: Secondary | ICD-10-CM

## 2021-02-16 MED ORDER — LEVONORGEST-ETH ESTRAD 91-DAY 0.15-0.03 &0.01 MG PO TABS: 1.0000 | ORAL_TABLET | Freq: Every day | ORAL | 4 refills | Status: DC

## 2021-02-16 NOTE — Patient Instructions (Signed)
Endometriosis  Endometriosis is a condition in which a tissue similar to the endometrium grows in places outside the uterus. The endometrium is a tissue that forms the lining of the uterus. This tissue can grow in the organs that create the eggs (ovaries), the tubes that carry the eggs to the uterus (fallopian tubes), the vagina, and the bowel. This tissue most often grows on the ovaries and inner lining of the pelvic cavity (peritoneum). When the uterus sheds the endometrium every menstrual cycle, there is bleeding wherever these types of tissue are located. This can cause pain because blood is irritating to tissues that are not normally exposed to it. Endometriosis can also make it harder for a woman to get pregnant. What are the causes? The cause of this condition is not known. What increases the risk? The following factors may make you more likely to develop this condition:  Having a family history of endometriosis.  Having never given birth.  Starting your period at 39 years of age or younger. What are the signs or symptoms? Often, there are no symptoms of this condition. If you do have symptoms, they may:  Vary depending on where the abnormal tissue is growing.  Occur during your menstrual period (most often) or at the middle of your cycle.  Come and go. You may have no symptoms during some months.  Stop when you no longer have your monthly periods (menopause). Symptoms may include:  Pain in the area between your hip bones (pelvis).  Heavier bleeding during periods.  Menstrual periods that happen more than once a month.  Pain during sex.  Pain in the back or abdomen.  Painful bowel movements.  Not being able to get pregnant. How is this diagnosed? This condition is diagnosed based on your symptoms and a physical exam. You may have tests, such as:  Blood tests and urine tests to help rule out other causes.  Ultrasound to look for tissues that are not normal. This is  often done over your skin. It is sometimes done through the vagina (transvaginal).  X-ray of the lower bowel (barium enema).  CT scan.  MRI. To confirm the diagnosis, your health care provider may use a device with a small camera to check tissue inside your abdomen (laparoscopy). Abnormal tissue may be removed and checked in a lab (biopsy). How is this treated? There is no cure for this condition. Treatment focuses on controlling your symptoms. The type of treatment also depends on whether you want to become pregnant in the future. This condition may be treated with:  Medicines. These may include: ? Medicines to relieve pain, including NSAIDs such as ibuprofen. ? Hormone therapy. This uses artificial hormones to slow the growth of the abnormal tissue. This may include hormonal birth control, such as pills.  Surgery to remove the abnormal tissue. During surgery: ? Tissue may be removed using a laparoscope and a laser (laparoscopic laser treatment). ? The fallopian tubes, uterus, and ovaries may be removed (hysterectomy). This is done in very severe cases. Follow these instructions at home:  Get regular exercise.  Limit alcohol use.  Eat a balanced diet.  Avoid caffeine.  Take over-the-counter and prescription medicines only as told by your health care provider.  Keep all follow-up visits as told by your health care provider. This is important. Where to find more information  [employer]: EgoNews.co.uk  Office on Women's Health: ChromeDoors.com.ee Contact a health care provider if:  You are having new  pain or trouble controlling pain.  You have problems getting pregnant.  You have a fever. Get help right away if you have:  Severe pain that does not get better with medicine.  Severe nausea and vomiting, or if you cannot eat or drink without vomiting.  Pain that affects your abdomen only on the lower, right  side.  Pain in your abdomen that gets worse.  Swelling in your abdomen.  Blood in your stool (feces). Summary  Endometriosis is a condition in which a tissue similar to the endometrium grows in places outside the uterus. The endometrium is a tissue that forms the lining of the uterus.  The cause of this condition is not known.  This condition may be treated with medicines to relieve pain, hormone therapy, or surgery.  If you have this condition, get regular exercise, limit alcohol use, and avoid caffeine.  Get help right away if you have severe pain that does not get better with medicine, or if you have severe nausea and vomiting or blood in your stool. This information is not intended to replace advice given to you by your health care provider. Make sure you discuss any questions you have with your health care provider. Document Revised: 12/17/2019 Document Reviewed: 12/17/2019 Elsevier Patient Education  2021 [place]. Elagolix tablets What is this medicine? ELAGOLIX (el a GOE lix) is used to treat endometriosis in women. It reduces pain from the condition and may help reduce painful sexual intercourse. This medicine may be used for other purposes; ask your health care provider or pharmacist if you have questions. COMMON BRAND NAME(S): Freida Busman What should I tell my health care provider before I take this medicine? They need to know if you have any of these conditions:  depression  liver disease  mental illness  osteoporosis, weak bones  suicidal thoughts, plans, or attempt; a previous suicide attempt by you or a family member  an unusual or allergic reaction to elagolix, other medicines, foods, dyes, or preservatives  pregnant or trying to get pregnant  breast-feeding How should I use this medicine? Take this medicine by mouth with a full glass of water. Take it as directed on the prescription label at the same time every day. You can take it with or without food.  If it upsets your stomach, take it with food. Keep taking it unless your health care provider tells you to stop. A special MedGuide will be given to you by the pharmacist with each prescription and refill. Be sure to read this information carefully each time. Talk to your pediatrician regarding the use of this medicine in children. This medicine is not approved for use in children. Overdosage: If you think you have taken too much of this medicine contact a poison control center or emergency room at once. NOTE: This medicine is only for you. Do not share this medicine with others. What if I miss a dose? If you miss a dose, take it as soon as you can. If it is almost time for your next dose, take only that dose. Do not take double or extra doses. What may interact with this medicine? Do not take this medicine with any of the following medications:  cyclosporine  enasidenib  gemfibrozil This medicine may also interact with the following medications:  certain antivirals for HIV or hepatitis  citalopram  digoxin  female hormones, like estrogens or progestins and birth control pills, patches, rings, or injections  methadone  midazolam  omeprazole  rifampin  rosuvastatin  This list may not describe all possible interactions. Give your health care provider a list of all the medicines, herbs, non-prescription drugs, or dietary supplements you use. Also tell them if you smoke, drink alcohol, or use illegal drugs. Some items may interact with your medicine. What should I watch for while using this medicine? Visit your doctor or health care professional for regular checks on your progress. This medicine may cause weak bones (osteoporosis). Only use this product for the amount of time your health care professional tells you to. The longer you use this product the more likely you will be at risk for weak bones. Ask your health care professional how you can keep strong bones. Patients and their  families should watch out for new or worsening depression or thoughts of suicide. Also watch out for sudden changes in feelings such as feeling anxious, agitated, panicky, irritable, hostile, aggressive, impulsive, severely restless, overly excited and hyperactive, or not being able to sleep. If this happens, call your health care professional. You may have a change in bleeding pattern or irregular periods. Many females stop having periods while taking this drug. Do not become pregnant while taking this medicine. Women should inform their doctor if they wish to become pregnant or think they might be pregnant. There is a potential for serious side effects to an unborn child. Talk to your health care professional or pharmacist for more information. This medicine does not prevent pregnancy. Women must use effective birth control with this medicine. Use a non-hormonal form of birth control while taking this medicine and for 28 days after stopping it. Talk to your health care professional about how to prevent pregnancy. What side effects may I notice from receiving this medicine? Side effects that you should report to your doctor or health care professional as soon as possible:  allergic reactions like skin rash, itching or hives, swelling of the face, lips, or tongue  anxious  depressed mood  signs and symptoms of liver injury like dark yellow or brown urine; general ill feeling or flu-like symptoms; light-colored stools; loss of appetite; nausea; right upper belly pain; unusually weak or tired; yellowing of the eyes or skin  suicidal thoughts or other mood changes Side effects that usually do not require medical attention (report these to your doctor or health care professional if they continue or are bothersome):  reduced or absent menstrual periods  headache  hot flashes or night sweats  nausea  joint pain  trouble sleeping This list may not describe all possible side effects. Call your  doctor for medical advice about side effects. You may report side effects to FDA at 1-800-FDA-1088. Where should I keep my medicine? Keep out of the reach of children and pets. Store at room temperature between 20 and 25 degrees C (68 and 77 degrees F). Get rid of any unused medicine after the expiration date. It is important to get rid of the medicine as soon as you no longer need it or it is expired. You can do this in two ways:  Take the medicine to a medicine take-back program. Check with your pharmacy or law enforcement to find a location.  If you cannot return the medicine, follow the directions in the Price. NOTE: This sheet is a summary. It may not cover all possible information. If you have questions about this medicine, talk to your doctor, pharmacist, or health care provider.  2021 Elsevier/Gold Standard (2020-02-10 08:56:37)

## 2021-02-16 NOTE — Progress Notes (Signed)
    Subjective:    Rachel [name] is a 35 y.o. female who presents to the clinic status post diagnostic laparoscopy on 01/26/21. The patient is not having any pain.  Eating a regular diet without difficulty. Bowel movements are normal. No other significant postoperative concerns.  The following portions of the patient's history were reviewed and updated as appropriate: allergies, current medications, past family history, past medical history, past social history, past surgical history and problem list..  Last pap smear was normal on 03/09/20.  Findings of endometriosis were discussed in detail with treatment options discussed.  Elagolix and depo lupron were discussed.  Pt declines lupron over concerns of her mental health.  She is interested in elagolix and possible remote robotic hysterectomy or TLH if the medication is ineffective.  She is going to [place] for a month and has requested 3 month supply of OCP.  Pt was converted to seasonique.  Review of Systems Pertinent items are noted in HPI.   Objective:   BP 115/79   Pulse 61   Ht 5\' 3"  (1.6 m)   Wt 98 lb 12.8 oz (44.8 kg)   BMI 17.50 kg/m  Constitutional:  Well-developed, well-nourished female in no acute distress.   Skin: Skin is warm and dry, no rash noted, not diaphoretic,no erythema, no pallor.  Cardiovascular: Normal heart rate noted  Respiratory: Effort and breath sounds normal, no problems with respiration noted  Abdomen: Soft, bowel sounds active, non-tender, no abnormal masses  Incision: Healing well, no drainage, no erythema, no hernia, no seroma, no swelling, no dehiscence, incision well approximated, 3 incisions noted healing well  Pelvic:   Deferred   Surgical pathology (none)  Assessment:   Doing well postoperatively.  Operative findings again reviewed. Pathology report discussed.   Plan:   1. Continue any current medications. 2. Wound care discussed. 3. Activity restrictions: none 4. Anticipated return to  work: now. 5. Follow up in 3 months with virtual visit to discuss treatment plan. 6.  Routine preventative health maintenance measures emphasized. Please refer to After Visit Summary for other counseling recommendations.    Lynnda Shields, MD, [place] Attending [place] for [facility], [place]

## 2021-02-21 ENCOUNTER — Encounter: Payer: Self-pay | Admitting: Obstetrics and Gynecology

## 2021-02-21 ENCOUNTER — Ambulatory Visit (INDEPENDENT_AMBULATORY_CARE_PROVIDER_SITE_OTHER): Payer: Medicaid Other | Admitting: Obstetrics and Gynecology

## 2021-02-21 DIAGNOSIS — T8149XA Infection following a procedure, other surgical site, initial encounter: Secondary | ICD-10-CM

## 2021-02-21 MED ORDER — SULFAMETHOXAZOLE-TRIMETHOPRIM 800-160 MG PO TABS: 1.0000 | ORAL_TABLET | Freq: Two times a day (BID) | ORAL | 0 refills | Status: AC

## 2021-02-21 NOTE — Progress Notes (Signed)
   Subjective:    Patient ID: Rachel [name], female    DOB: [birth year], 35 y.o.   MRN: [id number]  HPI Pt seen for evaluation of a possible wound infection.  Of note, pt was seen about a week ago and all port sites were normal.  Per pt she noted a small amount of drainage and pain on Saturday.  The wound is intact, but there is about a 1.5 cm area of erythema around the left port site.  The other port sites appear normal.  Pt was not sure if this was a new infection or an allergic reaction to some topical antibacterials.   Review of Systems     Objective:   Physical Exam Vitals:   02/21/21 1609  BP: 104/66  Pulse: 72   No pus or drainage expressed from left port site.  Erythema as described above.  Very mild tenderness noted at the wound site.       Assessment & [name]:   1. Wound infection after surgery Wound culture taken, will treat with oral antibiotics.  Pt to return if area of erythema becomes larger or pain increases.  Pt advised no further topical steroids or antibacterials.  F/U late June early July as previously discuissed - sulfamethoxazole-trimethoprim (BACTRIM DS) 800-160 MG tablet; Take 1 tablet by mouth 2 (two) times daily for 10 days.  Dispense: 20 tablet; Refill: 0 - Wound culture    [name], MD Faculty Attending, Center for [facility]

## 2021-02-22 NOTE — BH Specialist Note (Addendum)
Integrated Behavioral Health via Telemedicine Visit  02/22/2021 Dariel Pellecchia [id number]  Number of [place] visits: 8 Session Start time: 11:21  Session End time: 11:39 Total time: 18  Referring Provider: Lynnda Shields, MD Patient/Family location: Home [facility] Provider location: Center for [facility]  All persons participating in visit: [name] and [place]   Types of Service: Individual psychotherapy and Video visit  I connected with [name] and/or [place] via  Telephone or Video Enabled Telemedicine Application  (Video is Tree surgeon) and verified that I am speaking with the correct person using two identifiers. Discussed confidentiality: Yes   I discussed the limitations of telemedicine and the availability of in person appointments.  Discussed there is a possibility of technology failure and discussed alternative modes of communication if that failure occurs.  I discussed that engaging in this telemedicine visit, they consent to the provision of behavioral healthcare and the services will be billed under their insurance.  Patient and/or legal guardian expressed understanding and consented to Telemedicine visit: Yes   Presenting Concerns: Patient and/or family reports the following symptoms/concerns: Pt states her primary concern today is worry over being able to obtain medical paperwork to take on trip to [place] next week; feeling more positive, even in the midst of physical pain, after having answers about the cause of pain, and options to treat.  Duration of problem: recent; Severity of problem: moderate  Patient and/or Family's Strengths/Protective Factors: Concrete supports in place (healthy food, safe environments, etc.) and Sense of purpose  Goals Addressed: Patient will: 1.  Reduce symptoms of: anxiety, depression and stress  2.   Increase knowledge and/or ability of: stress reduction  3.  Demonstrate ability to: Increase healthy adjustment to current life circumstances  Progress towards Goals: Ongoing  Interventions: Interventions utilized:  Solution-Focused Strategies Standardized Assessments completed: Not Needed  Patient and/or Family Response: Pt agrees with treatment plan  Assessment: Patient currently experiencing PTSD, Bipolar 1 disorder, ADHD (all previously diagnosed).   Patient may benefit from continued psychoeducation and brief therapeutic interventions regarding coping with symptoms of anxiety, depression, stress .  Plan: 1. Follow up with behavioral health clinician on : Call Roselyn Reef upon return from out-of-country trip at [phone] 2. Behavioral recommendations:  -Discuss concerns today with clinical person in follow up call -Continue plan for safe travel on 03/10/21 3. Referral(s): [place] (In Clinic)  I discussed the assessment and treatment plan with the patient and/or parent/guardian. They were provided an opportunity to ask questions and all were answered. They agreed with the plan and demonstrated an understanding of the instructions.   They were advised to call back or seek an in-person evaluation if the symptoms worsen or if the condition fails to improve as anticipated.  [name], LCSW   Depression screen [facility] 2/9 02/21/2021 02/16/2021 10/11/2020 07/14/2020 06/28/2020  Decreased Interest 0 0 1 1 1   Down, Depressed, Hopeless 0 0 0 0 1  PHQ - 2 Score 0 0 1 1 2   Altered sleeping 1 2 3 3 3   Tired, decreased energy 1 2 3 3 3   Change in appetite 2 3 3 3 3   Feeling bad or failure about yourself  0 0 0 0 0  Trouble concentrating 0 2 2 2 2   Moving slowly or fidgety/restless 0 0 0 0 0  Suicidal thoughts 0 0 0 0 0  PHQ-9 Score 4 9 12 12  13  Some recent data might be hidden   GAD 7 : Generalized Anxiety Score 02/21/2021 02/16/2021 10/11/2020 07/14/2020   Nervous, Anxious, on Edge 0 1 1 2   Control/stop worrying 0 0 1 2  Worry too much - different things 0 0 1 2  Trouble relaxing 0 0 2 1  Restless 0 0 0 0  Easily annoyed or irritable 0 1 0 1  Afraid - awful might happen 0 0 0 0  Total GAD 7 Score 0 2 5 8

## 2021-02-24 ENCOUNTER — Telehealth: Payer: Self-pay | Admitting: Lactation Services

## 2021-02-24 LAB — SPECIMEN STATUS REPORT

## 2021-02-24 LAB — WOUND CULTURE

## 2021-02-24 NOTE — Telephone Encounter (Signed)
Patient called in and LM that her incision is having a clear yellow drainage, she is using a paper towel over it and is needing to change hourly. She is taking ATB 2 x a day as prescribed.   Reviewed keeping open to air and not using a cream or ATB to the area. Reviewed that it will take time for the incision to heal. The redness is the same. The pain is no worse. She feels there is a small opening in the center where the drainage is coming from. Reviewed is not seeming to improve by Monday to call and let us know. Reviewed is she has a fever, increased redness or pain to call back sooner. Patient voiced understanding.   She is having irritation around the site where she feels like she had an allergic reaction from the triple antibiotic to the area.  She is using ice to the area to help with the irritation.

## 2021-02-28 ENCOUNTER — Ambulatory Visit (INDEPENDENT_AMBULATORY_CARE_PROVIDER_SITE_OTHER): Payer: Medicaid Other | Admitting: Obstetrics and Gynecology

## 2021-02-28 ENCOUNTER — Encounter: Payer: Self-pay | Admitting: Obstetrics and Gynecology

## 2021-02-28 ENCOUNTER — Telehealth: Payer: Self-pay | Admitting: *Deleted

## 2021-02-28 ENCOUNTER — Other Ambulatory Visit: Payer: Self-pay

## 2021-02-28 DIAGNOSIS — T8189XD Other complications of procedures, not elsewhere classified, subsequent encounter: Secondary | ICD-10-CM

## 2021-02-28 DIAGNOSIS — T8189XA Other complications of procedures, not elsewhere classified, initial encounter: Secondary | ICD-10-CM | POA: Insufficient documentation

## 2021-02-28 NOTE — Patient Instructions (Signed)
Wound Care, Adult Taking care of your wound properly can help to prevent pain, infection, and scarring. It can also help your wound heal more quickly. Follow instructions from your health care provider about how to care for your wound. Supplies needed:  Soap and water.  Wound cleanser.  Gauze.  If needed, a clean bandage (dressing) or other type of wound dressing material to cover or place in the wound. Follow your health care provider's instructions about what dressing supplies to use.  Cream or ointment to apply to the wound, if told by your health care provider. How to care for your wound Cleaning the wound Ask your health care provider how to clean the wound. This may include:  Using mild soap and water or a wound cleanser.  Using a clean gauze to pat the wound dry after cleaning it. Do not rub or scrub the wound. Dressing care  Wash your hands with soap and water for at least 20 seconds before and after you change the dressing. If soap and water are not available, use hand sanitizer.  Change your dressing as told by your health care provider. This may include: ? Cleaning or rinsing out (irrigating) the wound. ? Placing a dressing over the wound or in the wound (packing). ? Covering the wound with an outer dressing.  Leave any stitches (sutures), skin glue, or adhesive strips in place. These skin closures may need to stay in place for 2 weeks or longer. If adhesive strip edges start to loosen and curl up, you may trim the loose edges. Do not remove adhesive strips completely unless your health care provider tells you to do that.  Ask your health care provider when you can leave the wound uncovered. Checking for infection Check your wound area every day for signs of infection. Check for:  More redness, swelling, or pain.  Fluid or blood.  Warmth.  Pus or a bad smell.   Follow these instructions at home Medicines  If you were prescribed an antibiotic medicine, cream, or  ointment, take or apply it as told by your health care provider. Do not stop using the antibiotic even if your condition improves.  If you were prescribed pain medicine, take it 30 minutes before you do any wound care or as told by your health care provider.  Take over-the-counter and prescription medicines only as told by your health care provider. Eating and drinking  Eat a diet that includes protein, vitamin A, vitamin C, and other nutrient-rich foods to help the wound heal. ? Foods rich in protein include meat, fish, eggs, dairy, beans, and nuts. ? Foods rich in vitamin A include carrots and dark green, leafy vegetables. ? Foods rich in vitamin C include citrus fruits, tomatoes, broccoli, and peppers.  Drink enough fluid to keep your urine pale yellow. General instructions  Do not take baths, swim, use a hot tub, or do anything that would put the wound underwater until your health care provider approves. Ask your health care provider if you may take showers. You may only be allowed to take sponge baths.  Do not scratch or pick at the wound. Keep it covered as told by your health care provider.  Return to your normal activities as told by your health care provider. Ask your health care provider what activities are safe for you.  Protect your wound from the sun when you are outside for the first 6 months, or for as long as told by your health care provider. Cover   up the scar area or apply sunscreen that has an SPF of at least 30.  Do not use any products that contain nicotine or tobacco, such as cigarettes, e-cigarettes, and chewing tobacco. These may delay wound healing. If you need help quitting, ask your health care provider.  Keep all follow-up visits as told by your health care provider. This is important. Contact a health care provider if:  You received a tetanus shot and you have swelling, severe pain, redness, or bleeding at the injection site.  Your pain is not controlled  with medicine.  You have any of these signs of infection: ? More redness, swelling, or pain around the wound. ? Fluid or blood coming from the wound. ? Warmth coming from the wound. ? Pus or a bad smell coming from the wound. ? A fever or chills.  You are nauseous or you vomit.  You are dizzy. Get help right away if:  You have a red streak of skin near the area around your wound.  Your wound has been closed with staples, sutures, skin glue, or adhesive strips and it begins to open up and separate.  Your wound is bleeding, and the bleeding does not stop with gentle pressure.  You have a rash.  You faint.  You have trouble breathing. These symptoms may represent a serious problem that is an emergency. Do not wait to see if the symptoms will go away. Get medical help right away. Call your local emergency services (911 in the [place] Do not drive yourself to the hospital. Summary  Always wash your hands with soap and water for at least 20 seconds before and after changing your dressing.  Change your dressing as told by your health care provider.  To help with healing, eat foods that are rich in protein, vitamin A, vitamin C, and other nutrients.  Check your wound every day for signs of infection. Contact your health care provider if you suspect that your wound is infected. This information is not intended to replace advice given to you by your health care provider. Make sure you discuss any questions you have with your health care provider. Document Revised: 08/15/2019 Document Reviewed: 08/15/2019 Elsevier Patient Education  2021 [employer] Inc.  

## 2021-02-28 NOTE — Telephone Encounter (Signed)
Patient left a voice message this am that she saw Dr. Elgie Congo twice for infection of her incision and he prescribed antibiotics. States she is on day 7 and it is still draining pus. States it doesn't feel as bad or look as bad. Wants to know if we should do anything else or extend antibiotics. States please call her plan of care from Dr. Elgie Congo.  Jaynee Winters,RN

## 2021-02-28 NOTE — Progress Notes (Signed)
Patient complains of drainage and swelling from wound (abdomen) from surgery (Laparoscopy) she had on 01/26/21. States that it is "clearish/yellowish"

## 2021-02-28 NOTE — Progress Notes (Signed)
Ms Sallade presents with c/o small clear drainage from an incision site. See previous office notes/visits for additional information Still has 3 days of antibiotics from last weeks visit. Reports incision site looks better from last week. Drainage is mostly clear. No fever or chills. Denies any bowel or bladder dysfunction  PE AF VSS Lungs clear Heart RRR Abd soft + BS LLQ incision site, scant clear discharge, no evidence of infection. All other sites well healed  A/P Wound check  Pt reassured, no evidence of infection. Wound care reviewed with pt. Complete antibiotics. F/U next week for recheck as pt will be traveling out of there country later next week.

## 2021-02-28 NOTE — Telephone Encounter (Signed)
Messaged Dr. Elgie Congo and he would like patient seen in office by provider to see if wound culture needed and more antibiotics. Consulted with registrar and scheduled appointment tomorrow [phone] with Dr. Harolyn Rutherford ( [facility]) as provider schedule today very full.  I called Rachel [name] and informed her of plan of care and she voices understanding. Perri Lamagna,RN

## 2021-03-01 ENCOUNTER — Ambulatory Visit: Payer: Medicaid Other | Admitting: Obstetrics & Gynecology

## 2021-03-04 ENCOUNTER — Ambulatory Visit (INDEPENDENT_AMBULATORY_CARE_PROVIDER_SITE_OTHER): Payer: Medicaid Other | Admitting: Clinical

## 2021-03-04 ENCOUNTER — Telehealth: Payer: Self-pay | Admitting: Clinical

## 2021-03-04 ENCOUNTER — Telehealth: Payer: Self-pay | Admitting: *Deleted

## 2021-03-04 DIAGNOSIS — F909 Attention-deficit hyperactivity disorder, unspecified type: Secondary | ICD-10-CM

## 2021-03-04 DIAGNOSIS — F4310 Post-traumatic stress disorder, unspecified: Secondary | ICD-10-CM

## 2021-03-04 DIAGNOSIS — F319 Bipolar disorder, unspecified: Secondary | ICD-10-CM

## 2021-03-04 NOTE — Patient Instructions (Signed)
[facility] [address] [place], [place] [address] [phone] (main office) [phone] (Jiles Goya's office)   

## 2021-03-04 NOTE — Telephone Encounter (Addendum)
Pt left VM message stating that she is still having fluid leaking from the surgical site which is yellow and frequent. She does have appt in office on 4/25 however is very concerned because she will be traveling to [place] on 4/28. She also has question regarding need for paper copies of test results which she cannot find on Mychart. Please call back.   Rachel [name] pt and discussed her concerns. She stated that the inflammation of the affected area has improved. There is still the same amount of discharge from the surgical site and this is concerning to her. She completed the course of antibiotics yesterday. I advised that Dr. Rip Harbour will perform exam on Monday and provide recommendations. Meanwhile, she should continue wound care as previously instructed.  She also stated that she is wanting copies of provider notes from office visits and her surgery report. She is not able to see this information in Mychart. I advised that she may sign release of information form in the office @ her appt on Monday in order to receive the requested information.

## 2021-03-04 NOTE — Telephone Encounter (Signed)
Intern called Pt to inform that [facility] will join call in a few minutes and will give Pt a call to inform her.  Gertha Calkin (Supervisor: Vesta Mixer)

## 2021-03-07 ENCOUNTER — Encounter: Payer: Self-pay | Admitting: Obstetrics and Gynecology

## 2021-03-07 ENCOUNTER — Ambulatory Visit (INDEPENDENT_AMBULATORY_CARE_PROVIDER_SITE_OTHER): Payer: Medicaid Other | Admitting: Obstetrics and Gynecology

## 2021-03-07 ENCOUNTER — Other Ambulatory Visit: Payer: Self-pay

## 2021-03-07 VITALS — BP 102/66 | HR 78 | Ht 62.6 in | Wt 97.1 lb

## 2021-03-07 DIAGNOSIS — T8189XD Other complications of procedures, not elsewhere classified, subsequent encounter: Secondary | ICD-10-CM

## 2021-03-07 DIAGNOSIS — N809 Endometriosis, unspecified: Secondary | ICD-10-CM

## 2021-03-07 NOTE — Progress Notes (Signed)
Rachel [name] is here for follow up visit for wound check. See prior notes. Pt reports still an occ drainage from incision but less. Has completed course of antibiotics. Denies fever or chills.  PE AF VSS Lungs clear Heart  RRR Abd soft, + BS, incision healing well, AgNO3 applied to incision.   A/P Wound check Continue with daily wound care until incision healed. F/U PRN

## 2021-04-27 ENCOUNTER — Telehealth: Payer: Self-pay | Admitting: Family Medicine

## 2021-04-27 NOTE — Telephone Encounter (Signed)
Patient called stating that she received a letter from the genetic testing office in [facility] (who she has been on a wait list for a year) saying that they are not longer testing for her condition. She is very upset and doesn't know where to go from here. She said that it was mentioned by Dr Tamala Julian that she might meet the diagnosis qualifications of [place]. She said that at this point, she is willing to take on that diagnosis to get answers.  Any advise on where to go from here?

## 2021-04-29 DIAGNOSIS — R110 Nausea: Secondary | ICD-10-CM | POA: Diagnosis not present

## 2021-04-29 DIAGNOSIS — R197 Diarrhea, unspecified: Secondary | ICD-10-CM | POA: Diagnosis not present

## 2021-04-29 DIAGNOSIS — K5900 Constipation, unspecified: Secondary | ICD-10-CM | POA: Diagnosis not present

## 2021-04-29 DIAGNOSIS — R634 Abnormal weight loss: Secondary | ICD-10-CM | POA: Diagnosis not present

## 2021-04-29 DIAGNOSIS — R142 Eructation: Secondary | ICD-10-CM | POA: Diagnosis not present

## 2021-04-29 DIAGNOSIS — R140 Abdominal distension (gaseous): Secondary | ICD-10-CM | POA: Diagnosis not present

## 2021-04-29 DIAGNOSIS — R112 Nausea with vomiting, unspecified: Secondary | ICD-10-CM | POA: Diagnosis not present

## 2021-04-29 DIAGNOSIS — R1084 Generalized abdominal pain: Secondary | ICD-10-CM | POA: Diagnosis not present

## 2021-05-02 ENCOUNTER — Encounter: Payer: Self-pay | Admitting: Obstetrics and Gynecology

## 2021-05-02 ENCOUNTER — Ambulatory Visit (INDEPENDENT_AMBULATORY_CARE_PROVIDER_SITE_OTHER): Payer: Medicaid Other | Admitting: Obstetrics and Gynecology

## 2021-05-02 ENCOUNTER — Other Ambulatory Visit: Payer: Self-pay

## 2021-05-02 VITALS — BP 117/80 | HR 70 | Wt 100.4 lb

## 2021-05-02 DIAGNOSIS — N938 Other specified abnormal uterine and vaginal bleeding: Secondary | ICD-10-CM | POA: Diagnosis not present

## 2021-05-02 DIAGNOSIS — N809 Endometriosis, unspecified: Secondary | ICD-10-CM

## 2021-05-02 DIAGNOSIS — N946 Dysmenorrhea, unspecified: Secondary | ICD-10-CM | POA: Diagnosis not present

## 2021-05-02 MED ORDER — ELAGOLIX SODIUM 150 MG PO TABS: 1.0000 | ORAL_TABLET | Freq: Every day | ORAL | 11 refills | Status: DC

## 2021-05-02 NOTE — Progress Notes (Signed)
  CC: endometriosis follow up Subjective:    Patient ID: Rachel Rachel [name], female    DOB: [birth year], 35 y.o.   MRN: [id number]  HPI Pt returns to office after having a trip to [place].  Pt tried prolonged use OCP while she was away and noted that intermenstrual bleeding was slightly less, but continues to have enough bleeding to be an inconvenience.  Pain level remained the same.  Long discussion with the patient and her mother regarding treatment options.  The patient understands she cannot take orlissa with OCP so she would have to make a choice.  Depo lupron with addback was also discussed but patient was concerned with how it may affect her mental health.  Detailed instructions were discussed and info was placed in mychart.   Review of Systems     Objective:   Physical Exam Vitals:   05/02/21 1545  BP: 117/80  Pulse: 70         Assessment & Rachel [name]:   1. Endometriosis determined by laparoscopy  - Elagolix Sodium 150 MG TABS; Take 1 tablet by mouth daily.  Dispense: 30 tablet; Refill: 11  2. DUB (dysfunctional uterine bleeding) Will continue to monitor, if bleeding worsens, may need to consider hysterectomy  3. Dysmenorrhea Likely due to endometriosis, will follow up in 3 months to see how her symptoms have improved/worsened.  F/u in 3 months  I spent 20 minutes dedicated to the care of this patient including previsit review of records, face to face time with the patient discussing treatment Rachel [name], side effects, alternatives and post visit testing.   Rachel Basil, MD Faculty Attending, [facility]

## 2021-05-04 NOTE — Telephone Encounter (Signed)
Pt called both locations and the do NOT accept out of state patients. She is unsure how to proceed.  FYI-I advised patient to contact her insurer with questions about out of state coverage.

## 2021-05-24 ENCOUNTER — Other Ambulatory Visit: Payer: Self-pay | Admitting: Gastroenterology

## 2021-05-25 ENCOUNTER — Telehealth: Payer: Self-pay | Admitting: Family Medicine

## 2021-05-25 DIAGNOSIS — K581 Irritable bowel syndrome with constipation: Secondary | ICD-10-CM

## 2021-05-25 MED ORDER — LINACLOTIDE 290 MCG PO CAPS: 290.0000 ug | ORAL_CAPSULE | Freq: Every day | ORAL | 1 refills | Status: DC

## 2021-05-25 NOTE — Telephone Encounter (Signed)
Pt requesting new GI referral and refill on Linzess medication.

## 2021-05-25 NOTE — Telephone Encounter (Signed)
Copied from [place] [id number]. Topic: General - Inquiry >> May 24, 2021  3:30 PM Greggory Keen D wrote: Reason for CRM: Pt called saying her GI doctor at [facility] left and she needs a refill on her Linzess.  She also ask if the office could fid her another practice for GI.  She does not want to go back to [place].  Send rx to [facility] at [place]  CB#  [phone]

## 2021-05-26 ENCOUNTER — Emergency Department (HOSPITAL_BASED_OUTPATIENT_CLINIC_OR_DEPARTMENT_OTHER): Payer: Medicaid Other

## 2021-05-26 ENCOUNTER — Emergency Department (HOSPITAL_BASED_OUTPATIENT_CLINIC_OR_DEPARTMENT_OTHER)
Admission: EM | Admit: 2021-05-26 | Discharge: 2021-05-26 | Disposition: A | Discharge status: Home / Self Care | Payer: Medicaid Other | Attending: Emergency Medicine | Admitting: Emergency Medicine

## 2021-05-26 ENCOUNTER — Other Ambulatory Visit: Payer: Self-pay

## 2021-05-26 ENCOUNTER — Encounter (HOSPITAL_BASED_OUTPATIENT_CLINIC_OR_DEPARTMENT_OTHER): Payer: Self-pay | Admitting: *Deleted

## 2021-05-26 DIAGNOSIS — J45909 Unspecified asthma, uncomplicated: Secondary | ICD-10-CM | POA: Insufficient documentation

## 2021-05-26 DIAGNOSIS — R1030 Lower abdominal pain, unspecified: Secondary | ICD-10-CM | POA: Diagnosis not present

## 2021-05-26 DIAGNOSIS — R1031 Right lower quadrant pain: Secondary | ICD-10-CM | POA: Diagnosis not present

## 2021-05-26 DIAGNOSIS — M549 Dorsalgia, unspecified: Secondary | ICD-10-CM | POA: Diagnosis not present

## 2021-05-26 DIAGNOSIS — R112 Nausea with vomiting, unspecified: Secondary | ICD-10-CM | POA: Insufficient documentation

## 2021-05-26 DIAGNOSIS — R109 Unspecified abdominal pain: Secondary | ICD-10-CM | POA: Diagnosis not present

## 2021-05-26 HISTORY — DX: Endometriosis, unspecified: N80.9

## 2021-05-26 LAB — COMPREHENSIVE METABOLIC PANEL
ALT: 11 U/L (ref 0–44)
AST: 14 U/L — ABNORMAL LOW (ref 15–41)
Albumin: 4 g/dL (ref 3.5–5.0)
Alkaline Phosphatase: 36 U/L — ABNORMAL LOW (ref 38–126)
Anion gap: 9 (ref 5–15)
BUN: 7 mg/dL (ref 6–20)
CO2: 21 mmol/L — ABNORMAL LOW (ref 22–32)
Calcium: 8.9 mg/dL (ref 8.9–10.3)
Chloride: 110 mmol/L (ref 98–111)
Creatinine, Ser: 0.66 mg/dL (ref 0.44–1.00)
GFR, Estimated: >60 mL/min (ref >60)
Glucose, Bld: 82 mg/dL (ref 70–99)
Potassium: 3.8 mmol/L (ref 3.5–5.1)
Sodium: 140 mmol/L (ref 135–145)
Total Bilirubin: 0.4 mg/dL (ref 0.3–1.2)
Total Protein: 6.8 g/dL (ref 6.5–8.1)

## 2021-05-26 LAB — CBC
HCT: 38.5 % (ref 36.0–46.0)
Hemoglobin: 13.1 g/dL (ref 12.0–15.0)
MCH: 31.4 pg (ref 26.0–34.0)
MCHC: 34 g/dL (ref 30.0–36.0)
MCV: 92.3 fL (ref 80.0–100.0)
Platelets: 362 10*3/uL (ref 150–400)
RBC: 4.17 MIL/uL (ref 3.87–5.11)
RDW: 13 % (ref 11.5–15.5)
WBC: 8.1 10*3/uL (ref 4.0–10.5)
nRBC: 0 % (ref 0.0–0.2)

## 2021-05-26 LAB — WET PREP, GENITAL
Clue Cells Wet Prep HPF POC: NONE SEEN
Sperm: NONE SEEN
Trich, Wet Prep: NONE SEEN
Yeast Wet Prep HPF POC: NONE SEEN

## 2021-05-26 LAB — URINALYSIS, ROUTINE W REFLEX MICROSCOPIC
Bilirubin Urine: NEGATIVE
Glucose, UA: NEGATIVE mg/dL
Hgb urine dipstick: NEGATIVE
Ketones, ur: NEGATIVE mg/dL
Leukocytes,Ua: NEGATIVE
Nitrite: NEGATIVE
Protein, ur: NEGATIVE mg/dL
Specific Gravity, Urine: 1.015 (ref 1.005–1.030)
pH: 5.5 (ref 5.0–8.0)

## 2021-05-26 LAB — LIPASE, BLOOD: Lipase: 43 U/L (ref 11–51)

## 2021-05-26 LAB — PREGNANCY, URINE: Preg Test, Ur: NEGATIVE

## 2021-05-26 MED ORDER — HYDROCODONE-ACETAMINOPHEN 5-325 MG PO TABS
1.0000 | ORAL_TABLET | Freq: Once | ORAL | Status: DC
  Filled 2021-05-26: qty 1

## 2021-05-26 MED ORDER — MORPHINE SULFATE (PF) 4 MG/ML IV SOLN
4.0000 mg | Freq: Once | INTRAVENOUS | Status: AC
  Administered 2021-05-26: 4 mg via INTRAVENOUS
  Filled 2021-05-26: qty 1

## 2021-05-26 MED ORDER — IOHEXOL 300 MG/ML  SOLN
75.0000 mL | Freq: Once | INTRAMUSCULAR | Status: AC | PRN
  Administered 2021-05-26: 75 mL via INTRAVENOUS

## 2021-05-26 MED ORDER — SODIUM CHLORIDE 0.9 % IV BOLUS
500.0000 mL | Freq: Once | INTRAVENOUS | Status: AC
  Administered 2021-05-26: 500 mL via INTRAVENOUS

## 2021-05-26 MED ORDER — ONDANSETRON HCL 4 MG/2ML IJ SOLN
4.0000 mg | Freq: Once | INTRAMUSCULAR | Status: AC
  Administered 2021-05-26: 4 mg via INTRAVENOUS
  Filled 2021-05-26: qty 2

## 2021-05-26 MED ORDER — KETOROLAC TROMETHAMINE 15 MG/ML IJ SOLN
15.0000 mg | Freq: Once | INTRAMUSCULAR | Status: AC
  Administered 2021-05-26: 15 mg via INTRAVENOUS
  Filled 2021-05-26: qty 1

## 2021-05-26 NOTE — Telephone Encounter (Signed)
Patient called stating that her mom found a genetic test that will test for all type of ehlers-danlos. She said that it is almost $400 but they are willing to pay it if it will help give the answers they/Dr Tamala Julian need. The company is: [employer]  Is this something Dr Tamala Julian thinks she should try?  Please advise. - Patient also said that she has reached out to everyone that Dr Tamala Julian recommended, but no one has called her back.

## 2021-05-26 NOTE — ED Triage Notes (Addendum)
Pt presents with c/o abdominal pain x 4 days. Hx of endometriosis. Started new med yesterday. C/o increased pain and bleeding. Pt tearful. Was advised to come to ED to eval for negative reaction to med

## 2021-05-26 NOTE — ED Notes (Signed)
Took over patient care and advised patient still waiting on ct

## 2021-05-26 NOTE — ED Provider Notes (Signed)
[name] Provider Note   CSN: [id number] Arrival date & time: 05/26/21  1343     History Chief Complaint  Patient presents with   Abdominal Pain    Hx of Endometriosis    R[name] is a 35 y.o. female.  HPI 35 year old female presents with worsening abdominal pain.  She has chronic lower abdominal pain from endometriosis.  Pain and bleeding has been worsening for the past 4 days or so.  She is also having some back pain with it.  She is having some dysuria but thinks this is more from her vagina being brought off from where she is had put some any tampons in.  Had max temp of 99.9 and has had some nausea and vomiting but she is not sure if that is from her gastroparesis.  She has been taking Tylenol and ibuprofen with no relief.  Her OB/GYN told her to come in.  She recently started [place] and wonders if this could be making things worse.  Past Medical History:  Diagnosis Date   ADHD (attention deficit hyperactivity disorder)    Anemia    Anxiety    Asthma    Bipolar disorder ([place])    Complication of anesthesia    slow to wake up    Delayed gastric emptying 02/23/2020   Depression    DUB (dysfunctional uterine bleeding) 02/23/2020   Endometriosis    GERD (gastroesophageal reflux disease)    Hiatal hernia 04/06/2020   Myalgia 04/06/2020   Parasitic infection 02/23/2020   Poor appetite 02/23/2020   RUQ pain 02/23/2020   Vitamin D deficiency    Weight loss 02/23/2020    Patient Active Problem List   Diagnosis Date Noted   Draining postoperative wound 02/28/2021   Encounter for postoperative care 02/16/2021   Endometriosis determined by laparoscopy    Pelvic pain in female 10/11/2020   Presence of subdermal contraceptive implant 10/11/2020   Dysmenorrhea 10/11/2020   Dyspareunia in female 10/11/2020   NSVT (nonsustained ventricular tachycardia) ([place]) 09/03/2020   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   Palpitations 07/26/2020    Family history of connective tissue disease 07/26/2020   Myalgia 04/06/2020   Hiatal hernia 04/06/2020   Parasitic infection 02/23/2020   Weight loss 02/23/2020   Poor appetite 02/23/2020   RUQ pain 02/23/2020   Delayed gastric emptying 02/23/2020   DUB (dysfunctional uterine bleeding) 02/23/2020   Cervical radiculopathy 05/31/2018   Hyperalgesia 05/31/2018   Acid reflux 12/26/2017   ADHD (attention deficit hyperactivity disorder) 05/07/2012   Bipolar 1 disorder ([place]) 05/07/2012    Past Surgical History:  Procedure Laterality Date   barium study     COLONOSCOPY     ESOPHAGOGASTRODUODENOSCOPY  2011   and a ph study as well.    LAPAROSCOPY N/A 01/26/2021   Procedure: LAPAROSCOPY DIAGNOSTIC;  Surgeon: [name], MD;  Location: [facility];  Service: Gynecology;  Laterality: N/A;   NASAL SEPTUM SURGERY     TONGUE FLAP     WISDOM TOOTH EXTRACTION       OB History     Gravida  0   Para  0   Term  0   Preterm  0   AB  0   Living  0      SAB  0   IAB  0   Ectopic  0   Multiple  0   Live Births  0           Family History  Problem Relation Age of Onset   Depression Mother    Anxiety disorder Mother    Hypertension Mother    Hyperlipidemia Mother    Asthma Mother    Endometriosis Mother    Fibroids Mother    Fibromyalgia Mother    Migraines Mother    Depression Father    Hypertension Father    Hyperlipidemia Father    Bipolar disorder Father    Bipolar disorder Sister    ADD / ADHD Sister    Asthma Sister    Endometriosis Sister    Endometriosis Maternal Grandmother    Fibroids Maternal Grandmother    Hyperlipidemia Maternal Grandmother    Hypertension Maternal Grandmother    Diabetes Maternal Grandmother    Fibromyalgia Maternal Grandmother    Seizures Sister    Bipolar disorder Sister    Anxiety disorder Sister     Social History   Tobacco Use   Smoking status: Never   Smokeless tobacco: Never  Vaping Use    Vaping Use: Never used  Substance Use Topics   Alcohol use: No   Drug use: No    Home Medications Prior to Admission medications   Medication Sig Start Date End Date Taking? Authorizing Provider  acetaminophen (TYLENOL) 500 MG tablet Take 1,000 mg by mouth every 6 (six) hours as needed for headache (pain).    [provider]  albuterol (VENTOLIN HFA) 108 (90 Base) MCG/ACT inhaler Inhale 1-2 puffs into the lungs every 6 (six) hours as needed for wheezing or shortness of breath. 01/04/21   Charlott Rakes, MD  cholecalciferol (VITAMIN D3) 25 MCG (1000 UNIT) tablet Take 1,000 Units by mouth daily.    [provider]  Elagolix Sodium 150 MG TABS Take 1 tablet by mouth daily. 05/02/21   [name], MD  fexofenadine (ALLEGRA) 180 MG tablet Take 180 mg by mouth daily.    [provider]  ibuprofen (ADVIL) 600 MG tablet Take 1 tablet (600 mg total) by mouth every 6 (six) hours as needed for headache, mild pain, moderate pain or cramping. 01/26/21   [name], MD  linaclotide Rolan Lipa) 290 MCG CAPS capsule Take by mouth. Patient not taking: Reported on 05/02/2021 09/24/20   [provider]  linaclotide Rolan Lipa) 290 MCG CAPS capsule Take 1 capsule (290 mcg total) by mouth daily before breakfast. 05/25/21   Ladell Pier, MD  MELATONIN PO Take by mouth at bedtime.    [provider]  naproxen sodium (ALEVE) 220 MG tablet Take 220 mg by mouth as needed.     [provider]  polyethylene glycol (MIRALAX / GLYCOLAX) 17 g packet Take 17 g by mouth daily. 1 capful daily    [provider]  promethazine (PHENERGAN) 12.5 MG tablet Take 1 tablet (12.5 mg total) by mouth every 6 (six) hours as needed for up to 5 days for nausea or vomiting. 01/26/21 01/31/21  [name], MD  Prucalopride Succinate 2 MG TABS Take 1 tablet by mouth daily. Patient not taking: No sig reported 08/18/20   [provider]  senna (SENOKOT) 8.6 MG  tablet Take 1 tablet by mouth daily.    [provider]  triamcinolone (KENALOG) 0.1 % Apply 1 application topically 2 (two) times daily. 10/18/20   Charlott Rakes, MD  vitamin C (ASCORBIC ACID) 500 MG tablet Take 500 mg by mouth daily.    [provider]    Allergies    Amphetamine-dextroamphetamine, Penicillins, Doxycycline, Olanzapine, Other, Prozac [  fluoxetine hcl], Reglan [metoclopramide], and Triple antibiotic pain relief [neomy-bacit-polymyx-pramoxine]  Review of Systems   Review of Systems  Constitutional:  Positive for fever (99.9).  Gastrointestinal:  Positive for abdominal pain and vomiting.  Genitourinary:  Positive for dysuria, vaginal bleeding and vaginal pain.  Musculoskeletal:  Positive for back pain.  All other systems reviewed and are negative.  Physical Exam Updated Vital Signs BP 113/80   Pulse (!) 56   Temp 98.6 F (37 C) (Oral)   Resp 16   Ht 5\' 2"  (1.575 m)   Wt 44 kg   LMP  (LMP Unknown)   SpO2 100%   BMI 17.74 kg/m   Physical Exam Vitals and nursing note reviewed. Exam conducted with a chaperone present.  Constitutional:      Appearance: She is well-developed. She is not ill-appearing or diaphoretic.  HENT:     Head: Normocephalic and atraumatic.     Right Ear: External ear normal.     Left Ear: External ear normal.     Nose: Nose normal.  Eyes:     General:        Right eye: No discharge.        Left eye: No discharge.  Cardiovascular:     Rate and Rhythm: Normal rate and regular rhythm.     Heart sounds: Normal heart sounds.  Pulmonary:     Effort: Pulmonary effort is normal.     Breath sounds: Normal breath sounds.  Abdominal:     Palpations: Abdomen is soft.     Tenderness: There is abdominal tenderness in the right lower quadrant, suprapubic area and left lower quadrant.  Genitourinary:    Labia:        Right: No rash.        Left: No rash.      Vagina: No signs of injury. Tenderness and bleeding (scant) present.  No vaginal discharge or lesions.     Cervix: No discharge or friability.  Skin:    General: Skin is warm and dry.  Neurological:     Mental Status: She is alert.  Psychiatric:        Mood and Affect: Mood is not anxious.    ED Results / Procedures / Treatments   Labs (all labs ordered are listed, but only abnormal results are displayed) Labs Reviewed  WET PREP, GENITAL - Abnormal; Notable for the following components:      Result Value   WBC, Wet Prep HPF POC RARE (*)    All other components within normal limits  COMPREHENSIVE METABOLIC PANEL - Abnormal; Notable for the following components:   CO2 21 (*)    AST 14 (*)    Alkaline Phosphatase 36 (*)    All other components within normal limits  LIPASE, BLOOD  CBC  URINALYSIS, ROUTINE W REFLEX MICROSCOPIC  PREGNANCY, URINE  GC/CHLAMYDIA PROBE AMP ([place]) NOT AT [name]    EKG None  Radiology CT ABDOMEN PELVIS W CONTRAST  Result Date: 05/26/2021 CLINICAL DATA:  Right-sided abdominal pain for several days, history of endometriosis EXAM: CT ABDOMEN AND PELVIS WITH CONTRAST TECHNIQUE: Multidetector CT imaging of the abdomen and pelvis was performed using the standard protocol following bolus administration of intravenous contrast. CONTRAST:  59mL OMNIPAQUE IOHEXOL 300 MG/ML  SOLN COMPARISON:  04/08/2020 FINDINGS: Lower chest: No acute abnormality. Hepatobiliary: Mild fatty infiltration of the liver is noted. The gallbladder is within normal limits. Pancreas: Unremarkable. No pancreatic ductal dilatation or surrounding inflammatory changes. Spleen: Normal in  size without focal abnormality. Adrenals/Urinary Tract: Adrenal glands are within normal limits. Kidneys demonstrate a normal enhancement pattern bilaterally. No obstructive changes are seen. No renal calculi are noted. The bladder is decompressed. Stomach/Bowel: No obstructive or inflammatory changes of the colon are seen. Fluid is noted within the colon. The stomach is  decompressed. Small bowel shows no evidence of obstructive change. The appendix is air-filled and within normal limits. Vascular/Lymphatic: No significant vascular findings are present. No enlarged abdominal or pelvic lymph nodes. Reproductive: Uterus and bilateral adnexa are unremarkable. Other: No abdominal wall hernia or abnormality. No abdominopelvic ascites. Musculoskeletal: No acute or significant osseous findings. IMPRESSION: Fatty liver. Normal appendix. No acute abnormality is identified to correspond with the given clinical history. Electronically Signed   By: Inez Catalina M.D.   On: 05/26/2021 21:13    Procedures Procedures   Medications Ordered in ED Medications  morphine 4 MG/ML injection 4 mg (4 mg Intravenous Given 05/26/21 1706)  ondansetron (ZOFRAN) injection 4 mg (4 mg Intravenous Given 05/26/21 1705)  sodium chloride 0.9 % bolus 500 mL (0 mLs Intravenous Stopped 05/26/21 1825)  ketorolac (TORADOL) 15 MG/ML injection 15 mg (15 mg Intravenous Given 05/26/21 1838)  iohexol (OMNIPAQUE) 300 MG/ML solution 75 mL (75 mLs Intravenous Contrast Given 05/26/21 2024)    ED Course  I have reviewed the triage vital signs and the nursing notes.  Pertinent labs & imaging results that were available during my care of the patient were reviewed by me and considered in my medical decision making (see chart for details).    MDM Rules/Calculators/A&P                          It is unclear if the patient is having worsening of her chronic abdominal issues from her recent change in meds or for another reason. No clear evidence of infection. Labs, CT are unremarkable. Given IV pain control. Ultimately will need to contact her GYN for further treatment. Stable for d/c home.  Final Clinical Impression(s) / ED Diagnoses Final diagnoses:  Lower abdominal pain    Rx / DC Orders ED Discharge Orders     None        Sherwood Gambler, MD 05/27/21 [phone]

## 2021-05-26 NOTE — Discharge Instructions (Addendum)
If you develop worsening, continued, or recurrent abdominal pain, uncontrolled vomiting, fever, chest or back pain, or any other new/concerning symptoms then return to the ER for evaluation.  

## 2021-05-26 NOTE — ED Notes (Signed)
Pt ambulatory, pain is that same, refused further pain medication, verbalized followup instructions, no questions at this time

## 2021-05-27 ENCOUNTER — Emergency Department (HOSPITAL_COMMUNITY)
Admission: EM | Admit: 2021-05-27 | Discharge: 2021-05-28 | Disposition: A | Discharge status: Home / Self Care | Payer: Medicaid Other | Attending: Emergency Medicine | Admitting: Emergency Medicine

## 2021-05-27 ENCOUNTER — Other Ambulatory Visit: Payer: Self-pay

## 2021-05-27 ENCOUNTER — Telehealth: Payer: Self-pay

## 2021-05-27 DIAGNOSIS — J45909 Unspecified asthma, uncomplicated: Secondary | ICD-10-CM | POA: Insufficient documentation

## 2021-05-27 DIAGNOSIS — L509 Urticaria, unspecified: Secondary | ICD-10-CM | POA: Diagnosis not present

## 2021-05-27 DIAGNOSIS — T7840XA Allergy, unspecified, initial encounter: Secondary | ICD-10-CM | POA: Diagnosis not present

## 2021-05-27 DIAGNOSIS — T782XXA Anaphylactic shock, unspecified, initial encounter: Secondary | ICD-10-CM | POA: Insufficient documentation

## 2021-05-27 DIAGNOSIS — R062 Wheezing: Secondary | ICD-10-CM | POA: Diagnosis not present

## 2021-05-27 DIAGNOSIS — R064 Hyperventilation: Secondary | ICD-10-CM | POA: Diagnosis not present

## 2021-05-27 LAB — CBC WITH DIFFERENTIAL/PLATELET
Abs Immature Granulocytes: 0.06 10*3/uL (ref 0.00–0.07)
Basophils Absolute: 0.1 10*3/uL (ref 0.0–0.1)
Basophils Relative: 0 %
Eosinophils Absolute: 0 10*3/uL (ref 0.0–0.5)
Eosinophils Relative: 0 %
HCT: 33.6 % — ABNORMAL LOW (ref 36.0–46.0)
Hemoglobin: 11.6 g/dL — ABNORMAL LOW (ref 12.0–15.0)
Immature Granulocytes: 0 %
Lymphocytes Relative: 3 %
Lymphs Abs: 0.4 10*3/uL — ABNORMAL LOW (ref 0.7–4.0)
MCH: 32.3 pg (ref 26.0–34.0)
MCHC: 34.5 g/dL (ref 30.0–36.0)
MCV: 93.6 fL (ref 80.0–100.0)
Monocytes Absolute: 0.2 10*3/uL (ref 0.1–1.0)
Monocytes Relative: 2 %
Neutro Abs: 13.6 10*3/uL — ABNORMAL HIGH (ref 1.7–7.7)
Neutrophils Relative %: 95 %
Platelets: 284 10*3/uL (ref 150–400)
RBC: 3.59 MIL/uL — ABNORMAL LOW (ref 3.87–5.11)
RDW: 12.9 % (ref 11.5–15.5)
WBC: 14.4 10*3/uL — ABNORMAL HIGH (ref 4.0–10.5)
nRBC: 0 % (ref 0.0–0.2)

## 2021-05-27 LAB — COMPREHENSIVE METABOLIC PANEL
ALT: 12 U/L (ref 0–44)
AST: 19 U/L (ref 15–41)
Albumin: 3.2 g/dL — ABNORMAL LOW (ref 3.5–5.0)
Alkaline Phosphatase: 43 U/L (ref 38–126)
Anion gap: 6 (ref 5–15)
BUN: 9 mg/dL (ref 6–20)
CO2: 21 mmol/L — ABNORMAL LOW (ref 22–32)
Calcium: 8.8 mg/dL — ABNORMAL LOW (ref 8.9–10.3)
Chloride: 112 mmol/L — ABNORMAL HIGH (ref 98–111)
Creatinine, Ser: 0.77 mg/dL (ref 0.44–1.00)
GFR, Estimated: >60 mL/min (ref >60)
Glucose, Bld: 218 mg/dL — ABNORMAL HIGH (ref 70–99)
Potassium: 3.5 mmol/L (ref 3.5–5.1)
Sodium: 139 mmol/L (ref 135–145)
Total Bilirubin: 0.5 mg/dL (ref 0.3–1.2)
Total Protein: 6 g/dL — ABNORMAL LOW (ref 6.5–8.1)

## 2021-05-27 LAB — GC/CHLAMYDIA PROBE AMP (~~LOC~~) NOT AT ARMC
Chlamydia: NEGATIVE
Comment: NEGATIVE
Comment: NORMAL
Neisseria Gonorrhea: NEGATIVE

## 2021-05-27 MED ORDER — EPINEPHRINE 0.3 MG/0.3ML IJ SOAJ: 0.3000 mg | INTRAMUSCULAR | 0 refills | Status: DC | PRN

## 2021-05-27 MED ORDER — LACTATED RINGERS IV BOLUS
1000.0000 mL | Freq: Once | INTRAVENOUS | Status: AC
  Administered 2021-05-27: 1000 mL via INTRAVENOUS

## 2021-05-27 MED ORDER — ACETAMINOPHEN 500 MG PO TABS
1000.0000 mg | ORAL_TABLET | Freq: Once | ORAL | Status: AC
  Administered 2021-05-27: 1000 mg via ORAL
  Filled 2021-05-27: qty 2

## 2021-05-27 MED ORDER — ONDANSETRON HCL 4 MG/2ML IJ SOLN
4.0000 mg | Freq: Once | INTRAMUSCULAR | Status: AC
  Administered 2021-05-27: 4 mg via INTRAVENOUS
  Filled 2021-05-27: qty 2

## 2021-05-27 NOTE — Telephone Encounter (Signed)
Pt was called and informed of referral and medication being sent to pharmacy.

## 2021-05-27 NOTE — Discharge Instructions (Addendum)
You were evaluated for your allergic reaction.  You were diagnosed with a gastrointestinal anaphylactic reaction.  Our work-up in the ER was reassuring against any other concerning pathology.  You are prescribed an epinephrine pen which should pick up this evening.  Please return for any worsening symptoms, including significant shortness of breath, full body hives, vomiting, bloody bowel movements.

## 2021-05-27 NOTE — Telephone Encounter (Addendum)
Transition Care Management Follow-up Telephone Call Date of discharge and from where: 05/26/2021-[facility] How have you been since you were released from the hospital? Pt states unable to sleep because she is in so much pain.  Any questions or concerns? No  Items Reviewed: Did the pt receive and understand the discharge instructions provided? Yes  Medications obtained and verified? No there were no medications given at discharge Other? No  Any new allergies since your discharge? No  Dietary orders reviewed? No Do you have support at home? Yes   Home Care and Equipment/Supplies: Were home health services ordered? not applicable If so, what is the name of the agency? N/A  Has the agency set up a time to come to the patient's home? not applicable Were any new equipment or medical supplies ordered?  No What is the name of the medical supply agency? N/A Were you able to get the supplies/equipment? not applicable Do you have any questions related to the use of the equipment or supplies? No  Functional Questionnaire: (I = Independent and D = Dependent) ADLs: I  Bathing/Dressing- I  Meal Prep- I  Eating- I  Maintaining continence- I  Transferring/Ambulation- I  Managing Meds- I  Follow up appointments reviewed:  PCP Hospital f/u appt confirmed? No  pt was advised to contact PCP office for follow up since she is still in pain. [facility] f/u appt confirmed? No  will contact OBGYN to schedule but the office is closed today.  Are transportation arrangements needed? No  If their condition worsens, is the pt aware to call PCP or go to the Emergency Dept.? Yes Was the patient provided with contact information for the PCP's office or ED? Yes Was to pt encouraged to call back with questions or concerns? Yes

## 2021-05-27 NOTE — ED Triage Notes (Signed)
Pt BIBA via [facility] from home due to allergic reaction. On new medication called Edward Qualia, taking it for the past four days with mild reactions, today reaction is severe. PTA, wheezing, throat tightness, hives on legs and belly. EMS gave epi (IM), 50mg  benedryl and 125mg  of solu-medrol. Symptoms resolved after medication given. Minor redness to skin still noted. Pt states to feel "stuff in her lungs". Patient A&Ox4, has IV access and has no complaints of airway compromise at this time.

## 2021-05-27 NOTE — ED Notes (Signed)
Pt stated she has a headache like brain fog. Pt stated she was dry heaving about 3x today. EDP at the bedside.

## 2021-05-27 NOTE — ED Provider Notes (Signed)
[name] EMERGENCY DEPARTMENT Provider Note   CSN: [id number] Arrival date & time: 05/27/21  2050     History Chief Complaint  Patient presents with   Allergic Reaction    Reaction to new medication     Rachel [name] is a 35 y.o. female.   Allergic Reaction  35 year old female PMHx ADHD, bipolar 1, endometriosis, family history of connective disease, bib [facility] for allergic reaction.  Started [place] 4 days ago with mild reactions, today reaction severe with symptoms including wheezing, throat tightness, diffuse hives.  She additionally noted that she began having bloody bowel movements approximately 1 hour after taking the [place] today.  States that she has a strict diet in the context of her gastroparesis, and has not deviated from this in the last several days.  En route, s/p IM epinephrine, Benadryl, Solu-Medrol with resolution of symptoms.  She has some persisting minor skin redness, sore throat, cough, and congestion, but otherwise feels she has generally returned to baseline.  No further medical concern at this time, including fevers, chills, rhinorrhea, chest pain, palpitations, pedal edema, abdominal pain, vomiting, acute bowel/bladder changes, audiovisual change, seizure, syncope.  History obtained from patient, mom, chart review.  Past Medical History:  Diagnosis Date   ADHD (attention deficit hyperactivity disorder)    Anemia    Anxiety    Asthma    Bipolar disorder ([place])    Complication of anesthesia    slow to wake up    Delayed gastric emptying 02/23/2020   Depression    DUB (dysfunctional uterine bleeding) 02/23/2020   Endometriosis    GERD (gastroesophageal reflux disease)    Hiatal hernia 04/06/2020   Myalgia 04/06/2020   Parasitic infection 02/23/2020   Poor appetite 02/23/2020   RUQ pain 02/23/2020   Vitamin D deficiency    Weight loss 02/23/2020    Patient Active Problem List   Diagnosis Date Noted   Draining  postoperative wound 02/28/2021   Encounter for postoperative care 02/16/2021   Endometriosis determined by laparoscopy    Pelvic pain in female 10/11/2020   Presence of subdermal contraceptive implant 10/11/2020   Dysmenorrhea 10/11/2020   Dyspareunia in female 10/11/2020   NSVT (nonsustained ventricular tachycardia) ([place]) 09/03/2020   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   Palpitations 07/26/2020   Family history of connective tissue disease 07/26/2020   Myalgia 04/06/2020   Hiatal hernia 04/06/2020   Parasitic infection 02/23/2020   Weight loss 02/23/2020   Poor appetite 02/23/2020   RUQ pain 02/23/2020   Delayed gastric emptying 02/23/2020   DUB (dysfunctional uterine bleeding) 02/23/2020   Cervical radiculopathy 05/31/2018   Hyperalgesia 05/31/2018   Acid reflux 12/26/2017   ADHD (attention deficit hyperactivity disorder) 05/07/2012   Bipolar 1 disorder ([place]) 05/07/2012    Past Surgical History:  Procedure Laterality Date   barium study     COLONOSCOPY     ESOPHAGOGASTRODUODENOSCOPY  2011   and a ph study as well.    LAPAROSCOPY N/A 01/26/2021   Procedure: LAPAROSCOPY DIAGNOSTIC;  Surgeon: [name], MD;  Location: [place];  Service: Gynecology;  Laterality: N/A;   NASAL SEPTUM SURGERY     TONGUE FLAP     WISDOM TOOTH EXTRACTION       OB History     Gravida  0   Para  0   Term  0   Preterm  0   AB  0   Living  0      SAB  0   IAB  0   Ectopic  0   Multiple  0   Live Births  0           Family History  Problem Relation Age of Onset   Depression Mother    Anxiety disorder Mother    Hypertension Mother    Hyperlipidemia Mother    Asthma Mother    Endometriosis Mother    Fibroids Mother    Fibromyalgia Mother    Migraines Mother    Depression Father    Hypertension Father    Hyperlipidemia Father    Bipolar disorder Father    Bipolar disorder Sister    ADD / ADHD Sister    Asthma Sister    Endometriosis  Sister    Endometriosis Maternal Grandmother    Fibroids Maternal Grandmother    Hyperlipidemia Maternal Grandmother    Hypertension Maternal Grandmother    Diabetes Maternal Grandmother    Fibromyalgia Maternal Grandmother    Seizures Sister    Bipolar disorder Sister    Anxiety disorder Sister     Social History   Tobacco Use   Smoking status: Never   Smokeless tobacco: Never  Vaping Use   Vaping Use: Never used  Substance Use Topics   Alcohol use: No   Drug use: No    Home Medications Prior to Admission medications   Medication Sig Start Date End Date Taking? Authorizing Provider  EPINEPHrine 0.3 mg/0.3 mL IJ SOAJ injection Inject 0.3 mg into the muscle as needed for anaphylaxis. 05/27/21  Yes Shantella Blubaugh, MD  acetaminophen (TYLENOL) 500 MG tablet Take 1,000 mg by mouth every 6 (six) hours as needed for headache (pain).    [provider]  albuterol (VENTOLIN HFA) 108 (90 Base) MCG/ACT inhaler Inhale 1-2 puffs into the lungs every 6 (six) hours as needed for wheezing or shortness of breath. 01/04/21   Charlott Rakes, MD  cholecalciferol (VITAMIN D3) 25 MCG (1000 UNIT) tablet Take 1,000 Units by mouth daily.    [provider]  Elagolix Sodium 150 MG TABS Take 1 tablet by mouth daily. 05/02/21   [name], MD  fexofenadine (ALLEGRA) 180 MG tablet Take 180 mg by mouth daily.    [provider]  ibuprofen (ADVIL) 600 MG tablet Take 1 tablet (600 mg total) by mouth every 6 (six) hours as needed for headache, mild pain, moderate pain or cramping. 01/26/21   [name], MD  linaclotide Rolan Lipa) 290 MCG CAPS capsule Take by mouth. Patient not taking: Reported on 05/02/2021 09/24/20   [provider]  linaclotide Rolan Lipa) 290 MCG CAPS capsule Take 1 capsule (290 mcg total) by mouth daily before breakfast. 05/25/21   Ladell Pier, MD  MELATONIN PO Take by mouth at bedtime.    [provider]  naproxen sodium  (ALEVE) 220 MG tablet Take 220 mg by mouth as needed.     [provider]  polyethylene glycol (MIRALAX / GLYCOLAX) 17 g packet Take 17 g by mouth daily. 1 capful daily    [provider]  promethazine (PHENERGAN) 12.5 MG tablet Take 1 tablet (12.5 mg total) by mouth every 6 (six) hours as needed for up to 5 days for nausea or vomiting. 01/26/21 01/31/21  [name], MD  Prucalopride Succinate 2 MG TABS Take 1 tablet by mouth daily. Patient not taking: No sig reported 08/18/20   [provider]  senna (SENOKOT) 8.6 MG tablet Take 1 tablet by mouth  daily.    [provider]  triamcinolone (KENALOG) 0.1 % Apply 1 application topically 2 (two) times daily. 10/18/20   Charlott Rakes, MD  vitamin C (ASCORBIC ACID) 500 MG tablet Take 500 mg by mouth daily.    [provider]    Allergies    Amphetamine-dextroamphetamine, Penicillins, Doxycycline, Olanzapine, Other, Prozac [fluoxetine hcl], Reglan [metoclopramide], and Triple antibiotic pain relief [neomy-bacit-polymyx-pramoxine]  Review of Systems   Review of Systems  All other systems reviewed and are negative.  Physical Exam Updated Vital Signs BP 117/81   Pulse 69   Temp 99.5 F (37.5 C) (Oral)   Resp 18   Ht 5' 2.6" (1.59 m)   Wt 44 kg   LMP  (LMP Unknown)   SpO2 100%   BMI 17.40 kg/m   Physical Exam Vitals and nursing note reviewed.  Constitutional:      General: She is not in acute distress.    Appearance: She is not toxic-appearing.  HENT:     Head: Normocephalic and atraumatic.     Nose: Nose normal.     Mouth/Throat:     Mouth: Mucous membranes are moist.     Pharynx: Oropharynx is clear.  Eyes:     Extraocular Movements: Extraocular movements intact.     Conjunctiva/sclera: Conjunctivae normal.     Pupils: Pupils are equal, round, and reactive to light.  Cardiovascular:     Rate and Rhythm: Normal rate and regular rhythm.     Heart sounds: No murmur heard.   No  friction rub. No gallop.  Pulmonary:     Effort: Pulmonary effort is normal.     Breath sounds: No stridor. No wheezing, rhonchi or rales.  Abdominal:     General: There is no distension.     Palpations: Abdomen is soft.     Tenderness: There is no abdominal tenderness. There is no right CVA tenderness, left CVA tenderness, guarding or rebound.  Musculoskeletal:     Cervical back: Normal range of motion. No rigidity.     Right lower leg: No edema.     Left lower leg: No edema.  Skin:    General: Skin is warm and dry.     Capillary Refill: Capillary refill takes less than 2 seconds.  Neurological:     General: No focal deficit present.     Mental Status: She is alert and oriented to person, place, and time.  Psychiatric:        Thought Content: Thought content normal.        Judgment: Judgment normal.     Comments: Some fidgeting, likely 2/2 Benadryl, steroid, epinephrine.  Cogent, linear thought    ED Results / Procedures / Treatments   Labs (all labs ordered are listed, but only abnormal results are displayed) Labs Reviewed  COMPREHENSIVE METABOLIC PANEL - Abnormal; Notable for the following components:      Result Value   Chloride 112 (*)    CO2 21 (*)    Glucose, Bld 218 (*)    Calcium 8.8 (*)    Total Protein 6.0 (*)    Albumin 3.2 (*)    All other components within normal limits  CBC WITH DIFFERENTIAL/PLATELET - Abnormal; Notable for the following components:   WBC 14.4 (*)    RBC 3.59 (*)    Hemoglobin 11.6 (*)    HCT 33.6 (*)    Neutro Abs 13.6 (*)    Lymphs Abs 0.4 (*)    All other components  within normal limits  CBC WITH DIFFERENTIAL/PLATELET    EKG None  Radiology CT ABDOMEN PELVIS W CONTRAST  Result Date: 05/26/2021 CLINICAL DATA:  Right-sided abdominal pain for several days, history of endometriosis EXAM: CT ABDOMEN AND PELVIS WITH CONTRAST TECHNIQUE: Multidetector CT imaging of the abdomen and pelvis was performed using the standard protocol  following bolus administration of intravenous contrast. CONTRAST:  34mL OMNIPAQUE IOHEXOL 300 MG/ML  SOLN COMPARISON:  04/08/2020 FINDINGS: Lower chest: No acute abnormality. Hepatobiliary: Mild fatty infiltration of the liver is noted. The gallbladder is within normal limits. Pancreas: Unremarkable. No pancreatic ductal dilatation or surrounding inflammatory changes. Spleen: Normal in size without focal abnormality. Adrenals/Urinary Tract: Adrenal glands are within normal limits. Kidneys demonstrate a normal enhancement pattern bilaterally. No obstructive changes are seen. No renal calculi are noted. The bladder is decompressed. Stomach/Bowel: No obstructive or inflammatory changes of the colon are seen. Fluid is noted within the colon. The stomach is decompressed. Small bowel shows no evidence of obstructive change. The appendix is air-filled and within normal limits. Vascular/Lymphatic: No significant vascular findings are present. No enlarged abdominal or pelvic lymph nodes. Reproductive: Uterus and bilateral adnexa are unremarkable. Other: No abdominal wall hernia or abnormality. No abdominopelvic ascites. Musculoskeletal: No acute or significant osseous findings. IMPRESSION: Fatty liver. Normal appendix. No acute abnormality is identified to correspond with the given clinical history. Electronically Signed   By: Inez Catalina M.D.   On: 05/26/2021 21:13    Procedures Procedures   Medications Ordered in ED Medications  lactated ringers bolus 1,000 mL (0 mLs Intravenous Stopped 05/27/21 2251)  ondansetron (ZOFRAN) injection 4 mg (4 mg Intravenous Given 05/27/21 2204)  acetaminophen (TYLENOL) tablet 1,000 mg (1,000 mg Oral Given 05/27/21 2309)    ED Course  I have reviewed the triage vital signs and the nursing notes.  Pertinent labs & imaging results that were available during my care of the patient were reviewed by me and considered in my medical decision making (see chart for details).    MDM  Rules/Calculators/A&P                          This is a 35 year old female PMHx bipolar, asthma, F Hx connective tissue disease, BIB EMS for presumed anaphylactic reaction, possibly related to recently starting [place].  On exam, she appears at baseline, lungs CTA bilaterally, normal respiratory effort, no rash appreciated.  Initial interventions: Tylenol 1 g, LR 1 L, Zofran administered with some improved comfort  DDx included: Anaphylaxis, allergic reaction, asthma, aspiration, pneumonia, PTX, pleural effusion, psychogenic etiology  All studies independently reviewed by myself, d/w the attending physician, factored into my MDM. -CBC: WBC 14.4, Hgb 11.6 -CMP: CO2 21, glucose 218  Presentation appears most consistent with GI related anaphylactic reaction, uncertain inciting factor, although this may be associate with her recently started medication.  Less likely asthma exacerbation.  Lungs clear to auscultation after completing treatment, suggesting his other pulmonary pathology.  Less likely psychogenic etiology given initial presentation in acute distress, and labs suggestive of acute physical stress.  Patient observed in the ER for approximately 3 hours without any recurrence.  Discussed with patient and mom regarding possibility of biphasic reaction which may occur outside the ER.  They feel comfortable returning home with close outpatient follow-up.  Additionally prescribed him an EpiPen and provided a printed prescription so that they can pick it up tonight.  Return precautions discussed.  Patient and mom understand and agree with  plan.  Patient HDS on reevaluation, nontoxic appearing, subsequently discharged.  Final Clinical Impression(s) / ED Diagnoses Final diagnoses:  Anaphylaxis, initial encounter    Rx / DC Orders ED Discharge Orders          Ordered    EPINEPHrine 0.3 mg/0.3 mL IJ SOAJ injection  As needed        05/27/21 2347             Levin Bacon,  MD 05/28/21 0158    Luna Fuse, MD 06/05/21 1055

## 2021-05-30 ENCOUNTER — Telehealth: Payer: Self-pay

## 2021-05-30 NOTE — Telephone Encounter (Signed)
Transition Care Management Follow-up Telephone Call Date of discharge and from where: 05/28/2021-[facility] ED  How have you been since you were released from the hospital? Still having trouble communicating verbally. Pain is still high, rash is worse.  Any questions or concerns? No  Items Reviewed: Did the pt receive and understand the discharge instructions provided? Yes  Medications obtained and verified? Yes  Other? No  Any new allergies since your discharge? No  Dietary orders reviewed? N/A Do you have support at home? Yes   Home Care and Equipment/Supplies: Were home health services ordered? not applicable If so, what is the name of the agency? N/A  Has the agency set up a time to come to the patient's home? not applicable Were any new equipment or medical supplies ordered?  No What is the name of the medical supply agency? N/A Were you able to get the supplies/equipment? not applicable Do you have any questions related to the use of the equipment or supplies? No  Functional Questionnaire: (I = Independent and D = Dependent) ADLs: I  Bathing/Dressing- I  Meal Prep- I  Eating- I  Maintaining continence- I  Transferring/Ambulation- I  Managing Meds- I  Follow up appointments reviewed:  PCP Hospital f/u appt confirmed? No  Specialist Hospital f/u appt confirmed? No   Are transportation arrangements needed? No  If their condition worsens, is the pt aware to call PCP or go to the Emergency Dept.? Yes Was the patient provided with contact information for the PCP's office or ED? Yes Was to pt encouraged to call back with questions or concerns? Yes

## 2021-06-01 ENCOUNTER — Other Ambulatory Visit: Payer: Self-pay

## 2021-06-01 ENCOUNTER — Ambulatory Visit: Payer: Medicaid Other | Admitting: Obstetrics and Gynecology

## 2021-06-01 VITALS — BP 129/88 | HR 78 | Wt 99.9 lb

## 2021-06-01 DIAGNOSIS — N809 Endometriosis, unspecified: Secondary | ICD-10-CM

## 2021-06-01 DIAGNOSIS — T50905A Adverse effect of unspecified drugs, medicaments and biological substances, initial encounter: Secondary | ICD-10-CM

## 2021-06-01 NOTE — Progress Notes (Signed)
  CC: drug reaction, endometriosis Subjective:    Patient ID: Rachel [name], female    DOB: Mar 12, 1986, 35 y.o.   MRN: [id number]  HPI 35 yo G0 seen at [facility].  She had a reaction to the [facility] 7/14-7/15.  Pt was given steroids, epinephrine and benadryl with resolution of symptoms.  There has been no recurrence since she has ceased taking the [place].  Discussed with pt at this point options are limited outside of hysterectomy for definitive therapy.  Pt is ok with hysterectomy option.  She is unsure if she would like to keep her ovaries.  Pt is aware she will be in surgical menopause if the ovaries are removed.  Leaving the ovaries may or may not contribute to continued pelvic pain once the uterus is removed.  Pt will discuss with her family before making a decision.   Review of Systems     Objective:   Physical Exam Vitals:   06/01/21 0829  BP: 129/88  Pulse: 78         Assessment & [name]:   1. Endometriosis determined by laparoscopy [name] for hysterectomy, likely TLH, will reach out to team members for consultation for the procedure.  Pt will decide on whether or not to keep her ovaries.  2. Adverse effect of drug, initial encounter No recurrence of symptoms since the drug has been stopped.  Blood pressure and pulse stable in the office today.  I spent 20 minutes dedicated to the care of this patient including previsit review of records, face to face time with the patient discussing treatment options, current care and post visit testing.     Rachel Basil, MD Faculty Attending, [facility]

## 2021-06-03 ENCOUNTER — Telehealth: Payer: Self-pay | Admitting: *Deleted

## 2021-06-03 NOTE — Telephone Encounter (Signed)
Patient left a voicemail this am stating she is trying to get Dr. Elgie Congo- states MyChart won't let her send message to him and she isn't sure why.  States he told her to message him about what type of hysterectomy she wants. States she has talked with her family and is certain she wants a total hysterectomy as minimally invasive as possible and with as little scar tissue as possible. Would like [place] to have Dr. Elgie Congo message her or call her and let her know he got her message and if he has any questions.  Will route to Dr. Elgie Congo and send pt. Message we got her message and routed it to him. Kyree Fedorko,RN

## 2021-06-06 ENCOUNTER — Ambulatory Visit (INDEPENDENT_AMBULATORY_CARE_PROVIDER_SITE_OTHER): Payer: Medicaid Other | Admitting: Clinical

## 2021-06-06 DIAGNOSIS — F430 Acute stress reaction: Secondary | ICD-10-CM | POA: Diagnosis not present

## 2021-06-06 NOTE — BH Specialist Note (Addendum)
Integrated Behavioral Health via Telemedicine Visit  06/06/2021 [name] FZ:[id number]  Number of [place] visits: 1 Session Start time: 10:19  Session End time: 11:16 Total time:  57  Referring Provider: Lynnda Shields, MD Patient/Family location: Home [facility] Provider [facility]: [facility]  All persons participating in visit: [name] and [name]   Types of Service: Telephone visit  I connected with [name] and/or [name]  n/a  via  Telephone or [employer]  (Video is Caregility application) and verified that I am speaking with the correct person using two identifiers. Discussed confidentiality: Yes   I discussed the limitations of telemedicine and the availability of in person appointments.  Discussed there is a possibility of technology failure and discussed alternative modes of communication if that failure occurs.  I discussed that engaging in this telemedicine visit, they consent to the provision of behavioral healthcare and the services will be billed under their insurance.  Patient and/or legal guardian expressed understanding and consented to Telemedicine visit: Yes   Presenting Concerns: Patient and/or family reports the following symptoms/concerns: Pt states her primary concern is night terrors after anaphylaxis incident less than 2 weeks ago, along with processing emotions regarding upcoming surgery.  Duration of problem: Two weeks; Severity of problem: moderate  Patient and/or Family's Strengths/Protective Factors: Social connections, Concrete supports in place (healthy food, safe environments, etc.), and Sense of purpose  Goals Addressed: Patient will:  Reduce symptoms of: anxiety and stress   Demonstrate ability to: Increase healthy adjustment to current life circumstances  Progress towards  Goals: Ongoing  Interventions: Interventions utilized:  Solution-Focused Strategies Standardized Assessments completed: Not Needed  Patient and/or Family Response: Pt agrees with treatment plan   Assessment: Patient currently experiencing Acute stress reaction and History of PTSD.   Patient may benefit from psychoeducation and brief therapeutic interventions regarding coping with symptoms of anxiety and life stress .  Plan: Follow up with behavioral health clinician on : Two weeks Behavioral recommendations:  -Begin keeping sleep journal, as discussed; consider strategic use of pillows to keep from sleeping on back -Continue with plan to attend beach vacation with friends -Continue prioritizing self-care and setting healthy boundaries prior to surgery Referral(s): [place] (In Clinic)  I discussed the assessment and treatment plan with the patient and/or parent/guardian. They were provided an opportunity to ask questions and all were answered. They agreed with the plan and demonstrated an understanding of the instructions.   They were advised to call back or seek an in-person evaluation if the symptoms worsen or if the condition fails to improve as anticipated.  [name], LCSW  Depression screen [facility] 2/9 05/02/2021 02/21/2021 02/16/2021 10/11/2020 07/14/2020  Decreased Interest 0 0 0 1 1  Down, Depressed, Hopeless 0 0 0 0 0  PHQ - 2 Score 0 0 0 1 1  Altered sleeping '2 1 2 3 3  '$ Tired, decreased energy '2 1 2 3 3  '$ Change in appetite '2 2 3 3 3  '$ Feeling bad or failure about yourself  0 0 0 0 0  Trouble concentrating 0 0 '2 2 2  '$ Moving slowly or fidgety/restless 0 0 0 0 0  Suicidal thoughts 0 0 0 0 0  PHQ-9 Score '6 4 9 12 12  '$ Some recent data might be hidden   GAD 7 : Generalized Anxiety Score 05/02/2021 02/21/2021 02/16/2021 10/11/2020  Nervous, Anxious, on Edge 0  0 1 1  Control/stop worrying 0 0 0 1  Worry too much - different things 0 0 0 1   Trouble relaxing 0 0 0 2  Restless 0 0 0 0  Easily annoyed or irritable 0 0 1 0  Afraid - awful might happen 0 0 0 0  Total GAD 7 Score 0 0 2 5

## 2021-06-08 NOTE — BH Specialist Note (Addendum)
Integrated Behavioral Health via Telemedicine Visit  06/08/2021 Adelisa Breach [id number]  Number of [place] visits:2 Session Start time: 10:44  Session End time: 11:53 Total time:  45  Referring Provider: Lynnda Shields, MD Patient/Family location: Home [facility] Provider location: [facility] for [facility]  All persons participating in visit: Patient Denaja Hohl and [place]   Types of Service: Individual psychotherapy  I connected with [name] and/or [place]  via  Telephone or Geologist, engineering  (Video is Tree surgeon) and verified that I am speaking with the correct person using two identifiers. Discussed confidentiality: Yes   I discussed the limitations of telemedicine and the availability of in person appointments.  Discussed there is a possibility of technology failure and discussed alternative modes of communication if that failure occurs.  I discussed that engaging in this telemedicine visit, they consent to the provision of behavioral healthcare and the services will be billed under their insurance.  Patient and/or legal guardian expressed understanding and consented to Telemedicine visit: Yes   Presenting Concerns: Patient and/or family reports the following symptoms/concerns: Pt states her primary concern today is processing communication issue with fiance, along with an increase in concentration/memory/attention difficulty; feelings regarding upcoming surgery. Duration of problem: Ongoing; Severity of problem: moderate  Patient and/or Family's Strengths/Protective Factors: Social connections, Concrete supports in place (healthy food, safe environments, etc.), and Sense of purpose  Goals Addressed: Patient will:  Reduce symptoms of: anxiety and stress   Increase knowledge and/or ability of:   Demonstrate ability to: Increase  healthy adjustment to current life circumstances and Increase motivation to adhere to plan of care  Progress towards Goals: Ongoing  Interventions: Interventions utilized:  ACT (Acceptance and Commitment Therapy) Standardized Assessments completed: Not Needed  Patient and/or Family Response: Pt agrees to ongoing treatment plan  Assessment: Patient currently experiencing ADHD and PTSD (both as previously diagnosed).   Patient may benefit from continued psychoeducation and brief therapeutic interventions regarding coping with symptoms of anxiety and life stress .  Plan: Follow up with behavioral health clinician on : Two weeks Behavioral recommendations:  -Continue using self-coping strategies that have remained helpful  -Obtain whiteboard and colored markers for writing/drawing out thoughts and ideas as they come, within the next two weeks -Discussion with loved ones within the next two weeks about needing to text yourself questions during conversations with them -Consider: When not receiving words of affirmation and/or quality time from others, step back without judgment and give those gifts to yourself instead Referral(s): [place] (In Clinic)  I discussed the assessment and treatment plan with the patient and/or parent/guardian. They were provided an opportunity to ask questions and all were answered. They agreed with the plan and demonstrated an understanding of the instructions.   They were advised to call back or seek an in-person evaluation if the symptoms worsen or if the condition fails to improve as anticipated.  [name], LCSW  Depression screen [facility] 2/9 06/20/2021 06/17/2021 05/02/2021 02/21/2021 02/16/2021  Decreased Interest 0 0 0 0 0  Down, Depressed, Hopeless 0 0 0 0 0  PHQ - 2 Score 0 0 0 0 0  Altered sleeping 3 - '2 1 2  '$ Tired, decreased energy 2 - '2 1 2  '$ Change in appetite 3 - '2 2 3  '$ Feeling bad or failure about yourself  0 - 0 0 0   Trouble concentrating 1 - 0 0  2  Moving slowly or fidgety/restless 0 - 0 0 0  Suicidal thoughts 0 - 0 0 0  PHQ-9 Score 9 - '6 4 9  '$ Some recent data might be hidden   GAD 7 : Generalized Anxiety Score 06/20/2021 06/17/2021 05/02/2021 02/21/2021  Nervous, Anxious, on Edge 1 1 0 0  Control/stop worrying 0 1 0 0  Worry too much - different things 0 0 0 0  Trouble relaxing 1 0 0 0  Restless 0 0 0 0  Easily annoyed or irritable 0 0 0 0  Afraid - awful might happen 0 0 0 0  Total GAD 7 Score 2 2 0 0

## 2021-06-13 ENCOUNTER — Telehealth: Payer: Self-pay | Admitting: *Deleted

## 2021-06-13 ENCOUNTER — Encounter: Payer: Self-pay | Admitting: *Deleted

## 2021-06-13 NOTE — Telephone Encounter (Signed)
Call to patient. Advised surgery is scheduled for 07-12-21 at 0730 at [place] will receive letter in mail and pre-op call with instructions. Patient states she is still planning ovary removal and will sign Hysterectomy statement at pre-op appointment.

## 2021-06-14 NOTE — Telephone Encounter (Signed)
Cto patient approximately 915 am. Advised will need pre-op with Dr Elgie Congo and she will be contacted to schedule this.  Encounter closed.

## 2021-06-17 ENCOUNTER — Ambulatory Visit: Payer: Medicaid Other | Attending: Family Medicine | Admitting: Family Medicine

## 2021-06-17 ENCOUNTER — Other Ambulatory Visit: Payer: Self-pay

## 2021-06-17 ENCOUNTER — Encounter: Payer: Self-pay | Admitting: Family Medicine

## 2021-06-17 VITALS — BP 143/85 | HR 92 | Resp 16 | Wt 98.6 lb

## 2021-06-17 DIAGNOSIS — Z9289 Personal history of other medical treatment: Secondary | ICD-10-CM

## 2021-06-17 DIAGNOSIS — T782XXS Anaphylactic shock, unspecified, sequela: Secondary | ICD-10-CM | POA: Diagnosis not present

## 2021-06-17 NOTE — Progress Notes (Signed)
Established Patient Office Visit  Subjective:  Patient ID: Rachel [name], female    DOB: [birth year]  Age: 35 y.o. MRN: [id number]  CC:  Chief Complaint  Patient presents with   Hospitalization Follow-up    ED    HPI Rachel [name] presents for follow up of recent ED visit for anaphylaxis. She  complains of some memory issues since the ED visit but reports that it seems to be improving.  She will  be having a total hysterectomy at the end of the month.  Past Medical History:  Diagnosis Date   ADHD (attention deficit hyperactivity disorder)    Anemia    Anxiety    Asthma    Bipolar disorder ([place])    Complication of anesthesia    slow to wake up    Delayed gastric emptying 02/23/2020   Depression    DUB (dysfunctional uterine bleeding) 02/23/2020   Endometriosis    GERD (gastroesophageal reflux disease)    Hiatal hernia 04/06/2020   Myalgia 04/06/2020   Parasitic infection 02/23/2020   Poor appetite 02/23/2020   RUQ pain 02/23/2020   Vitamin D deficiency    Weight loss 02/23/2020    Past Surgical History:  Procedure Laterality Date   barium study     COLONOSCOPY     ESOPHAGOGASTRODUODENOSCOPY  2011   and a ph study as well.    LAPAROSCOPY N/A 01/26/2021   Procedure: LAPAROSCOPY DIAGNOSTIC;  Surgeon: [name], MD;  Location: [place];  Service: Gynecology;  Laterality: N/A;   NASAL SEPTUM SURGERY     TONGUE FLAP     WISDOM TOOTH EXTRACTION        Social History   Socioeconomic History   Marital status: Divorced    Spouse name: Not on file   Number of children: Not on file   Years of education: Not on file   Highest education level: Not on file  Occupational History   Not on file  Tobacco Use   Smoking status: Never   Smokeless tobacco: Never  Vaping Use   Vaping Use: Never used  Substance and Sexual Activity   Alcohol use: No   Drug use: No   Sexual activity: Not Currently  Other Topics Concern   Not on file   Social History Narrative   Not on file   Social Determinants of Health   Financial Resource Strain: Not on file  Food Insecurity: No Food Insecurity   Worried About Running Out of Food in the Last Year: Never true   Ran Out of Food in the Last Year: Never true  Transportation Needs: No Transportation Needs   Lack of Transportation (Medical): No   Lack of Transportation (Non-Medical): No  Physical Activity: Not on file  Stress: Not on file  Social Connections: Not on file  Intimate Partner Violence: Not on file    Outpatient Medications Prior to Visit  Medication Sig Dispense Refill   acetaminophen (TYLENOL) 500 MG tablet Take 1,000 mg by mouth every 6 (six) hours as needed for headache (pain).     albuterol (VENTOLIN HFA) 108 (90 Base) MCG/ACT inhaler Inhale 1-2 puffs into the lungs every 6 (six) hours as needed for wheezing or shortness of breath. 8 g 6   cholecalciferol (VITAMIN D3) 25 MCG (1000 UNIT) tablet Take 1,000 Units by mouth daily.     Elagolix Sodium 150 MG TABS Take 1 tablet by mouth daily. (Patient not taking: No sig reported) 30 tablet 11  EPINEPHrine 0.3 mg/0.3 mL IJ SOAJ injection Inject 0.3 mg into the muscle as needed for anaphylaxis. 2 each 0   fexofenadine (ALLEGRA) 180 MG tablet Take 180 mg by mouth daily.     ibuprofen (ADVIL) 600 MG tablet Take 1 tablet (600 mg total) by mouth every 6 (six) hours as needed for headache, mild pain, moderate pain or cramping. 30 tablet 2   linaclotide (LINZESS) 290 MCG CAPS capsule Take by mouth.     linaclotide (LINZESS) 290 MCG CAPS capsule Take 1 capsule (290 mcg total) by mouth daily before breakfast. 30 capsule 1   MELATONIN PO Take by mouth at bedtime.     naproxen sodium (ALEVE) 220 MG tablet Take 220 mg by mouth as needed.      polyethylene glycol (MIRALAX / GLYCOLAX) 17 g packet Take 17 g by mouth daily. 1 capful daily     promethazine (PHENERGAN) 12.5 MG tablet Take 1 tablet (12.5 mg total) by mouth every 6 (six)  hours as needed for up to 5 days for nausea or vomiting. 20 tablet 0   Prucalopride Succinate 2 MG TABS Take 1 tablet by mouth daily. (Patient not taking: No sig reported)     senna (SENOKOT) 8.6 MG tablet Take 1 tablet by mouth daily.     triamcinolone (KENALOG) 0.1 % Apply 1 application topically 2 (two) times daily. 45 g 1   vitamin C (ASCORBIC ACID) 500 MG tablet Take 500 mg by mouth daily.     No facility-administered medications prior to visit.    Allergies  Allergen Reactions   Amphetamine-Dextroamphetamine Other (See Comments)    Unknown reaction   Orilissa [Elagolix] Anaphylaxis    ALL   Penicillins Swelling    Severe allergy per allergy test Did it involve swelling of the face/tongue/throat, SOB, or low BP? Yes Did it involve sudden or severe rash/hives, skin peeling, or any reaction on the inside of your mouth or nose? Unknown Did you need to seek medical attention at a hospital or doctor's office? Unknown When did it last happen? teenager      If all above answers are "NO", may proceed with cephalosporin use.   Doxycycline Itching, Nausea And Vomiting and Other (See Comments)    Caused high fever   Olanzapine Other (See Comments)    Unknown reaction   Other     Stimulants cause unknown reaction   Prozac [Fluoxetine Hcl] Other (See Comments)    Unknown reaction   Reglan [Metoclopramide]     Causes her bipolar issues to trigger and muscle twitching and stiffness    Triple Antibiotic Pain Relief [Neomy-Bacit-Polymyx-Pramoxine] Hives    ROS Review of Systems  Respiratory:  Negative for chest tightness and shortness of breath.   All other systems reviewed and are negative.    Objective:    Physical Exam Vitals reviewed.  Constitutional:      General: She is not in acute distress. Cardiovascular:     Rate and Rhythm: Normal rate and regular rhythm.  Pulmonary:     Effort: Pulmonary effort is normal.     Breath sounds: Normal breath sounds.  Abdominal:      Palpations: Abdomen is soft.     Tenderness: There is no abdominal tenderness.  Neurological:     General: No focal deficit present.     Mental Status: She is alert and oriented to person, place, and time.  Psychiatric:        Mood and Affect: Mood normal.  Behavior: Behavior normal.    BP (!) 143/85   Pulse 92   Resp 16   Wt 98 lb 9.6 oz (44.7 kg)   LMP  (LMP Unknown)   SpO2 97%   BMI 17.69 kg/m  Wt Readings from Last 3 Encounters:  06/17/21 98 lb 9.6 oz (44.7 kg)  06/01/21 99 lb 14.4 oz (45.3 kg)  05/27/21 97 lb (44 kg)     Health Maintenance Due  Topic Date Due   INFLUENZA VACCINE  06/13/2021    There are no preventive care reminders to display for this patient.  Lab Results  Component Value Date   TSH 1.760 02/04/2020   Lab Results  Component Value Date   WBC 14.4 (H) 05/27/2021   HGB 11.6 (L) 05/27/2021   HCT 33.6 (L) 05/27/2021   MCV 93.6 05/27/2021   PLT 284 05/27/2021   Lab Results  Component Value Date   NA 139 05/27/2021   K 3.5 05/27/2021   CO2 21 (L) 05/27/2021   GLUCOSE 218 (H) 05/27/2021   BUN 9 05/27/2021   CREATININE 0.77 05/27/2021   BILITOT 0.5 05/27/2021   ALKPHOS 43 05/27/2021   AST 19 05/27/2021   ALT 12 05/27/2021   PROT 6.0 (L) 05/27/2021   ALBUMIN 3.2 (L) 05/27/2021   CALCIUM 8.8 (L) 05/27/2021   ANIONGAP 6 05/27/2021     Assessment & Plan:   Anaphylaxis, sequela - some memory loss per patient - monitor  Hospitalization within last 30 days - much improved    No orders of the defined types were placed in this encounter.   Follow-up: Return if symptoms worsen or fail to improve.    Becky Sax, MD

## 2021-06-20 ENCOUNTER — Other Ambulatory Visit: Payer: Self-pay

## 2021-06-20 ENCOUNTER — Ambulatory Visit (INDEPENDENT_AMBULATORY_CARE_PROVIDER_SITE_OTHER): Payer: Medicaid Other | Admitting: Obstetrics and Gynecology

## 2021-06-20 ENCOUNTER — Encounter: Payer: Self-pay | Admitting: *Deleted

## 2021-06-20 ENCOUNTER — Encounter: Payer: Self-pay | Admitting: Obstetrics and Gynecology

## 2021-06-20 DIAGNOSIS — Z01818 Encounter for other preprocedural examination: Secondary | ICD-10-CM

## 2021-06-20 NOTE — Progress Notes (Signed)
Hysterectomy statement signed.

## 2021-06-20 NOTE — H&P (View-Only) (Signed)
OB/GYN Pre-Op History and Physical  Rachel [name] is a 35 y.o. G0P0000 presenting for preop appointment. Pt is very clear that she would like her ovaries removed.  She is aware that she will enter surgical menopause and likely will need hormone replacement therapy.  She is aware that after this procedure she will no longer be able to have children.  Risks and benefits of the procedure have been given including bleeding, infection, involvement of other organs including bladder, bowel and ureter.  Pt has been informed she will need cystoscopy after the procedure to eval the ureters      Past Medical History:  Diagnosis Date   ADHD (attention deficit hyperactivity disorder)    Anemia    Anxiety    Asthma    Bipolar disorder ([place])    Complication of anesthesia    slow to wake up    Delayed gastric emptying 02/23/2020   Depression    DUB (dysfunctional uterine bleeding) 02/23/2020   Endometriosis    GERD (gastroesophageal reflux disease)    Hiatal hernia 04/06/2020   Myalgia 04/06/2020   Parasitic infection 02/23/2020   Poor appetite 02/23/2020   RUQ pain 02/23/2020   Vitamin D deficiency    Weight loss 02/23/2020    Past Surgical History:  Procedure Laterality Date   barium study     COLONOSCOPY     ESOPHAGOGASTRODUODENOSCOPY  2011   and a ph study as well.    LAPAROSCOPY N/A 01/26/2021   Procedure: LAPAROSCOPY DIAGNOSTIC;  Surgeon: [name], MD;  Location: [place];  Service: Gynecology;  Laterality: N/A;   NASAL SEPTUM SURGERY     TONGUE FLAP     WISDOM TOOTH EXTRACTION      OB History  Gravida Para Term Preterm AB Living  0 0 0 0 0 0  SAB IAB Ectopic Multiple Live Births  0 0 0 0 0    Social History   Socioeconomic History   Marital status: Divorced    Spouse name: Not on file   Number of children: Not on file   Years of education: Not on file   Highest education level: Not on file  Occupational History   Not on file  Tobacco  Use   Smoking status: Never   Smokeless tobacco: Never  Vaping Use   Vaping Use: Never used  Substance and Sexual Activity   Alcohol use: No   Drug use: No   Sexual activity: Not Currently  Other Topics Concern   Not on file  Social History Narrative   Not on file   Social Determinants of Health   Financial Resource Strain: Not on file  Food Insecurity: No Food Insecurity   Worried About Running Out of Food in the Last Year: Never true   Ran Out of Food in the Last Year: Never true  Transportation Needs: No Transportation Needs   Lack of Transportation (Medical): No   Lack of Transportation (Non-Medical): No  Physical Activity: Not on file  Stress: Not on file  Social Connections: Not on file    Family History  Problem Relation Age of Onset   Depression Mother    Anxiety disorder Mother    Hypertension Mother    Hyperlipidemia Mother    Asthma Mother    Endometriosis Mother    Fibroids Mother    Fibromyalgia Mother    Migraines Mother    Depression Father    Hypertension Father    Hyperlipidemia Father  Bipolar disorder Father    Bipolar disorder Sister    ADD / ADHD Sister    Asthma Sister    Endometriosis Sister    Endometriosis Maternal Grandmother    Fibroids Maternal Grandmother    Hyperlipidemia Maternal Grandmother    Hypertension Maternal Grandmother    Diabetes Maternal Grandmother    Fibromyalgia Maternal Grandmother    Seizures Sister    Bipolar disorder Sister    Anxiety disorder Sister     (Not in a hospital admission)   Allergies  Allergen Reactions   Amphetamine-Dextroamphetamine Other (See Comments)    Unknown reaction   Orilissa [Elagolix] Anaphylaxis    ALL   Penicillins Swelling    Severe allergy per allergy test Did it involve swelling of the face/tongue/throat, SOB, or low BP? Yes Did it involve sudden or severe rash/hives, skin peeling, or any reaction on the inside of your mouth or nose? Unknown Did you need to seek  medical attention at a hospital or doctor's office? Unknown When did it last happen? teenager      If all above answers are "NO", may proceed with cephalosporin use.   Doxycycline Itching, Nausea And Vomiting and Other (See Comments)    Caused high fever   Olanzapine Other (See Comments)    Unknown reaction   Other     Stimulants cause unknown reaction   Prozac [Fluoxetine Hcl] Other (See Comments)    Unknown reaction   Reglan [Metoclopramide]     Causes her bipolar issues to trigger and muscle twitching and stiffness    Triple Antibiotic Pain Relief [Neomy-Bacit-Polymyx-Pramoxine] Hives    Review of Systems: Negative except for what is mentioned in HPI.     Physical Exam: BP 136/84   Ht 5' 2.6" (1.59 m)   Wt 99 lb 9.6 oz (45.2 kg)   LMP  (LMP Unknown)   BMI 17.87 kg/m  CONSTITUTIONAL: sli, Well-developed, well-nourished female in no acute distress.  HENT:  Normocephalic, atraumatic, External right and left ear normal. Oropharynx is clear and moist EYES: Conjunctivae and EOM are normal. Pupils are equal, round, and reactive to light. No scleral icterus.  NECK: Normal range of motion, supple, no masses SKIN: Skin is warm and dry. No rash noted. Not diaphoretic. No erythema. No pallor. Faith: Alert and oriented to person, place, and time. Normal reflexes, muscle tone coordination. No cranial nerve deficit noted. PSYCHIATRIC: Normal mood and affect. Normal behavior. Normal judgment and thought content. CARDIOVASCULAR: Normal heart rate noted, regular rhythm RESPIRATORY: Effort and breath sounds normal, no problems with respiration noted ABDOMEN: Soft, nontender, nondistended, gravid. Well healed laparoscopy scars. PELVIC: Deferred MUSCULOSKELETAL: Normal range of motion. No edema and no tenderness. 2+ distal pulses.   Pertinent Labs/Studies:   No results found for this or any previous visit (from the past 72 hour(s)).     Assessment and Plan :Rachel [name] is a 35  y.o. G0P0000 here for preoperative examination.   Plan for total laparoscopic hysterectomy with bilateral salpingo-oophorectomy. NPO night before procedure Admission labs ordered Preoperative information given. Risks and benefits as above.   Lynnda Shields, M.D. Attending [place], [facility] for [employer], [place]

## 2021-06-20 NOTE — Progress Notes (Signed)
OB/GYN Pre-Op History and Physical  Rachel [name] is a 35 y.o. G0P0000 presenting for preop appointment. Pt is very clear that she would like her ovaries removed.  She is aware that she will enter surgical menopause and likely will need hormone replacement therapy.  She is aware that after this procedure she will no longer be able to have children.  Risks and benefits of the procedure have been given including bleeding, infection, involvement of other organs including bladder, bowel and ureter.  Pt has been informed she will need cystoscopy after the procedure to eval the ureters      Past Medical History:  Diagnosis Date   ADHD (attention deficit hyperactivity disorder)    Anemia    Anxiety    Asthma    Bipolar disorder ([place])    Complication of anesthesia    slow to wake up    Delayed gastric emptying 02/23/2020   Depression    DUB (dysfunctional uterine bleeding) 02/23/2020   Endometriosis    GERD (gastroesophageal reflux disease)    Hiatal hernia 04/06/2020   Myalgia 04/06/2020   Parasitic infection 02/23/2020   Poor appetite 02/23/2020   RUQ pain 02/23/2020   Vitamin D deficiency    Weight loss 02/23/2020    Past Surgical History:  Procedure Laterality Date   barium study     COLONOSCOPY     ESOPHAGOGASTRODUODENOSCOPY  2011   and a ph study as well.    LAPAROSCOPY N/A 01/26/2021   Procedure: LAPAROSCOPY DIAGNOSTIC;  Surgeon: [name], MD;  Location: [place];  Service: Gynecology;  Laterality: N/A;   NASAL SEPTUM SURGERY     TONGUE FLAP     WISDOM TOOTH EXTRACTION      OB History  Gravida Para Term Preterm AB Living  0 0 0 0 0 0  SAB IAB Ectopic Multiple Live Births  0 0 0 0 0    Social History   Socioeconomic History   Marital status: Divorced    Spouse name: Not on file   Number of children: Not on file   Years of education: Not on file   Highest education level: Not on file  Occupational History   Not on file  Tobacco  Use   Smoking status: Never   Smokeless tobacco: Never  Vaping Use   Vaping Use: Never used  Substance and Sexual Activity   Alcohol use: No   Drug use: No   Sexual activity: Not Currently  Other Topics Concern   Not on file  Social History Narrative   Not on file   Social Determinants of Health   Financial Resource Strain: Not on file  Food Insecurity: No Food Insecurity   Worried About Running Out of Food in the Last Year: Never true   Ran Out of Food in the Last Year: Never true  Transportation Needs: No Transportation Needs   Lack of Transportation (Medical): No   Lack of Transportation (Non-Medical): No  Physical Activity: Not on file  Stress: Not on file  Social Connections: Not on file    Family History  Problem Relation Age of Onset   Depression Mother    Anxiety disorder Mother    Hypertension Mother    Hyperlipidemia Mother    Asthma Mother    Endometriosis Mother    Fibroids Mother    Fibromyalgia Mother    Migraines Mother    Depression Father    Hypertension Father    Hyperlipidemia Father  Bipolar disorder Father    Bipolar disorder Sister    ADD / ADHD Sister    Asthma Sister    Endometriosis Sister    Endometriosis Maternal Grandmother    Fibroids Maternal Grandmother    Hyperlipidemia Maternal Grandmother    Hypertension Maternal Grandmother    Diabetes Maternal Grandmother    Fibromyalgia Maternal Grandmother    Seizures Sister    Bipolar disorder Sister    Anxiety disorder Sister     (Not in a hospital admission)   Allergies  Allergen Reactions   Amphetamine-Dextroamphetamine Other (See Comments)    Unknown reaction   Orilissa [Elagolix] Anaphylaxis    ALL   Penicillins Swelling    Severe allergy per allergy test Did it involve swelling of the face/tongue/throat, SOB, or low BP? Yes Did it involve sudden or severe rash/hives, skin peeling, or any reaction on the inside of your mouth or nose? Unknown Did you need to seek  medical attention at a hospital or doctor's office? Unknown When did it last happen? teenager      If all above answers are "NO", may proceed with cephalosporin use.   Doxycycline Itching, Nausea And Vomiting and Other (See Comments)    Caused high fever   Olanzapine Other (See Comments)    Unknown reaction   Other     Stimulants cause unknown reaction   Prozac [Fluoxetine Hcl] Other (See Comments)    Unknown reaction   Reglan [Metoclopramide]     Causes her bipolar issues to trigger and muscle twitching and stiffness    Triple Antibiotic Pain Relief [Neomy-Bacit-Polymyx-Pramoxine] Hives    Review of Systems: Negative except for what is mentioned in HPI.     Physical Exam: BP 136/84   Ht 5' 2.6" (1.59 m)   Wt 99 lb 9.6 oz (45.2 kg)   LMP  (LMP Unknown)   BMI 17.87 kg/m  CONSTITUTIONAL: sli, Well-developed, well-nourished female in no acute distress.  HENT:  Normocephalic, atraumatic, External right and left ear normal. Oropharynx is clear and moist EYES: Conjunctivae and EOM are normal. Pupils are equal, round, and reactive to light. No scleral icterus.  NECK: Normal range of motion, supple, no masses SKIN: Skin is warm and dry. No rash noted. Not diaphoretic. No erythema. No pallor. [place]: Alert and oriented to person, place, and time. Normal reflexes, muscle tone coordination. No cranial nerve deficit noted. PSYCHIATRIC: Normal mood and affect. Normal behavior. Normal judgment and thought content. CARDIOVASCULAR: Normal heart rate noted, regular rhythm RESPIRATORY: Effort and breath sounds normal, no problems with respiration noted ABDOMEN: Soft, nontender, nondistended, gravid. Well healed laparoscopy scars. PELVIC: Deferred MUSCULOSKELETAL: Normal range of motion. No edema and no tenderness. 2+ distal pulses.   Pertinent Labs/Studies:   No results found for this or any previous visit (from the past 72 hour(s)).     Assessment and Plan :Rachel [name] is a 35  y.o. G0P0000 here for preoperative examination.   Plan for total laparoscopic hysterectomy with bilateral salpingo-oophorectomy. NPO night before procedure Admission labs ordered Preoperative information given. Risks and benefits as above.   Lynnda Shields, M.D. Attending [place], [facility] for [employer], [place]

## 2021-06-22 ENCOUNTER — Other Ambulatory Visit: Payer: Self-pay

## 2021-06-22 ENCOUNTER — Ambulatory Visit (INDEPENDENT_AMBULATORY_CARE_PROVIDER_SITE_OTHER): Payer: Medicaid Other | Admitting: Clinical

## 2021-06-22 DIAGNOSIS — F909 Attention-deficit hyperactivity disorder, unspecified type: Secondary | ICD-10-CM | POA: Diagnosis not present

## 2021-06-22 DIAGNOSIS — Z658 Other specified problems related to psychosocial circumstances: Secondary | ICD-10-CM

## 2021-06-22 DIAGNOSIS — F4310 Post-traumatic stress disorder, unspecified: Secondary | ICD-10-CM

## 2021-06-23 ENCOUNTER — Inpatient Hospital Stay: Payer: Medicaid Other | Admitting: Family Medicine

## 2021-06-28 ENCOUNTER — Ambulatory Visit: Payer: Medicaid Other | Admitting: Family Medicine

## 2021-06-29 NOTE — BH Specialist Note (Addendum)
Integrated Behavioral Health via Telemedicine Visit  06/29/2021 Rachel [name] [id number]  Number of [place] visits: 2(10 total) Session Start time: 10:45  Session End time: 11:35 Total time: 50   Referring Provider: Lynnda Shields, MD Patient/Family location: Home [facility] Provider location: Center for [place] [facility]  All persons participating in visit: [name] and Rachel [name]   Types of Service: Telephone visit  I connected with Rachel [name] [name]  via  Telephone or [employer]  (Video is Caregility application) and verified that I am speaking with the correct person using two identifiers. Discussed confidentiality: Yes   I discussed the limitations of telemedicine and the availability of in person appointments.  Discussed there is a possibility of technology failure and discussed alternative modes of communication if that failure occurs.  I discussed that engaging in this telemedicine visit, they consent to the provision of behavioral healthcare and the services will be billed under their insurance.  Patient and/or legal guardian expressed understanding and consented to Telemedicine visit: Yes   Presenting Concerns: Patient and/or family reports the following symptoms/concerns: Pt processing the unknowns after upcoming surgery; has been implementing many self-coping strategies the past few weeks effectively.  Duration of problem: Ongoing; Severity of problem: moderate  Patient and/or Family's Strengths/Protective Factors: Social connections, Concrete supports in place (healthy food, safe environments, etc.), and Sense of purpose  Goals Addressed: Patient will:  Reduce symptoms of: anxiety and stress   Demonstrate ability to: Increase healthy adjustment to current life circumstances  Progress towards  Goals: Ongoing  Interventions: Interventions utilized:  ACT (Acceptance and Commitment Therapy) Standardized Assessments completed: Not Needed  Patient and/or Family Response: Pt agrees to treatment [name]  Assessment: Patient currently experiencing ADHD and PTSD (both previously diagnosed) .   Patient may benefit from continued psychoeducation and brief therapeutic interventions regarding coping with symptoms of anxiety and current life stress .  [name]: Follow up with behavioral health clinician on : About 3 weeks Behavioral recommendations:  -Continue using self-coping strategies that have been helpful in the past, including positive affirmations, visual of thoughts and ideas on whiteboard, healthy conversations regarding personal needs and boundaries, etc. Referral(s): [place] (In Clinic)  I discussed the assessment and treatment [name] with the patient and/or parent/guardian. They were provided an opportunity to ask questions and all were answered. They agreed with the [name] and demonstrated an understanding of the instructions.   They were advised to call back or seek an in-person evaluation if the symptoms worsen or if the condition fails to improve as anticipated.  Caroleen Hamman Terease Marcotte, LCSW

## 2021-07-04 NOTE — Progress Notes (Signed)
Surgical Instructions    Your procedure is scheduled on Tuesday, August 30th, 2022.   Report to [facility] Main Entrance "A" at 05:30 A.M., then check in with the Admitting office.  Call this number if you have problems the morning of surgery:  [phone]   If you have any questions prior to your surgery date call [phone]: Open Monday-Friday 8am-4pm    Remember:  Do not eat or drink after midnight the night before your surgery    Take these medicines the morning of surgery with A SIP OF WATER   fexofenadine (ALLEGRA):  If needed:  acetaminophen (TYLENOL)  albuterol (VENTOLIN HFA) - please, bring the inhaler with you the day of surgery EPINEPHrine   As of today, STOP taking any Aspirin (unless otherwise instructed by your surgeon) Aleve, Naproxen, Ibuprofen, Motrin, Advil, Goody's, BC's, all herbal medications, fish oil, and all vitamins.          Do not wear jewelry or makeup Do not wear lotions, powders, perfumes, or deodorant. Do not shave 48 hours prior to surgery.   Do not bring valuables to the hospital. DO Not wear nail polish, gel polish, artificial nails, or any other type of covering on natural nails including finger and toenails. If patients have artificial nails, gel coating, etc. that need to be removed by a nail salon please have this removed prior to surgery or surgery may need to be canceled/delayed if the surgeon/ anesthesia feels like the patient is unable to be adequately monitored.             [name] is not responsible for any belongings or valuables.  Do NOT Smoke (Tobacco/Vaping) or drink Alcohol 24 hours prior to your procedure If you use a CPAP at night, you may bring all equipment for your overnight stay.   Contacts, glasses, dentures or bridgework may not be worn into surgery, please bring cases for these belongings   For patients admitted to the hospital, discharge time will be determined by your treatment team.   Patients discharged  the day of surgery will not be allowed to drive home, and someone needs to stay with them for 24 hours.  ONLY 1 SUPPORT PERSON MAY BE PRESENT WHILE YOU ARE IN SURGERY. IF YOU ARE TO BE ADMITTED ONCE YOU ARE IN YOUR ROOM YOU WILL BE ALLOWED TWO (2) VISITORS.  Minor children may have two parents present. Special consideration for safety and communication needs will be reviewed on a case by case basis.  Special instructions:    Oral Hygiene is also important to reduce your risk of infection.  Remember - BRUSH YOUR TEETH THE MORNING OF SURGERY WITH YOUR REGULAR TOOTHPASTE   Rachel [name]- Preparing For Surgery  Before surgery, you can play an important role. Because skin is not sterile, your skin needs to be as free of germs as possible. You can reduce the number of germs on your skin by washing with CHG (chlorahexidine gluconate) Soap before surgery.  CHG is an antiseptic cleaner which kills germs and bonds with the skin to continue killing germs even after washing.     Please do not use if you have an allergy to CHG or antibacterial soaps. If your skin becomes reddened/irritated stop using the CHG.  Do not shave (including legs and underarms) for at least 48 hours prior to first CHG shower. It is OK to shave your face.  Please follow these instructions carefully.     Shower the [employer] SURGERY and  the MORNING OF SURGERY with CHG Soap.   If you chose to wash your hair, wash your hair first as usual with your normal shampoo. After you shampoo, rinse your hair and body thoroughly to remove the shampoo.  Then [employer] and genitals (private parts) with your normal soap and rinse thoroughly to remove soap.  After that Use CHG Soap as you would any other liquid soap. You can apply CHG directly to the skin and wash gently with a scrungie or a clean washcloth.   Apply the CHG Soap to your body ONLY FROM THE NECK DOWN.  Do not use on open wounds or open sores. Avoid contact with your eyes, ears,  mouth and genitals (private parts). Wash Face and genitals (private parts)  with your normal soap.   Wash thoroughly, paying special attention to the area where your surgery will be performed.  Thoroughly rinse your body with warm water from the neck down.  DO NOT shower/wash with your normal soap after using and rinsing off the CHG Soap.  Pat yourself dry with a CLEAN TOWEL.  Wear CLEAN PAJAMAS to bed the night before surgery  Place CLEAN SHEETS on your bed the night before your surgery  DO NOT SLEEP WITH PETS.   Day of Surgery:  Take a shower with CHG soap. Wear Clean/Comfortable clothing the morning of surgery Do not apply any deodorants/lotions.   Remember to brush your teeth WITH YOUR REGULAR TOOTHPASTE.   Please read over the following fact sheets that you were given.

## 2021-07-05 ENCOUNTER — Encounter (HOSPITAL_COMMUNITY): Payer: Self-pay

## 2021-07-05 ENCOUNTER — Other Ambulatory Visit: Payer: Self-pay

## 2021-07-05 ENCOUNTER — Encounter (HOSPITAL_COMMUNITY)
Admission: RE | Admit: 2021-07-05 | Discharge: 2021-07-05 | Disposition: A | Discharge status: Home / Self Care | Payer: Medicaid Other | Source: Ambulatory Visit | Attending: Obstetrics and Gynecology | Admitting: Obstetrics and Gynecology

## 2021-07-05 DIAGNOSIS — F909 Attention-deficit hyperactivity disorder, unspecified type: Secondary | ICD-10-CM | POA: Diagnosis not present

## 2021-07-05 DIAGNOSIS — Z01812 Encounter for preprocedural laboratory examination: Secondary | ICD-10-CM | POA: Diagnosis not present

## 2021-07-05 DIAGNOSIS — J45909 Unspecified asthma, uncomplicated: Secondary | ICD-10-CM | POA: Insufficient documentation

## 2021-07-05 DIAGNOSIS — K219 Gastro-esophageal reflux disease without esophagitis: Secondary | ICD-10-CM | POA: Insufficient documentation

## 2021-07-05 DIAGNOSIS — Z8616 Personal history of COVID-19: Secondary | ICD-10-CM | POA: Insufficient documentation

## 2021-07-05 DIAGNOSIS — Z79899 Other long term (current) drug therapy: Secondary | ICD-10-CM | POA: Insufficient documentation

## 2021-07-05 DIAGNOSIS — F319 Bipolar disorder, unspecified: Secondary | ICD-10-CM | POA: Insufficient documentation

## 2021-07-05 DIAGNOSIS — R011 Cardiac murmur, unspecified: Secondary | ICD-10-CM | POA: Diagnosis not present

## 2021-07-05 DIAGNOSIS — N938 Other specified abnormal uterine and vaginal bleeding: Secondary | ICD-10-CM | POA: Diagnosis not present

## 2021-07-05 DIAGNOSIS — Z791 Long term (current) use of non-steroidal anti-inflammatories (NSAID): Secondary | ICD-10-CM | POA: Diagnosis not present

## 2021-07-05 DIAGNOSIS — D649 Anemia, unspecified: Secondary | ICD-10-CM | POA: Diagnosis not present

## 2021-07-05 HISTORY — DX: Cardiac murmur, unspecified: R01.1

## 2021-07-05 HISTORY — DX: Pneumonia, unspecified organism: J18.9

## 2021-07-05 LAB — BASIC METABOLIC PANEL
Anion gap: 6 (ref 5–15)
BUN: 10 mg/dL (ref 6–20)
CO2: 28 mmol/L (ref 22–32)
Calcium: 9.2 mg/dL (ref 8.9–10.3)
Chloride: 103 mmol/L (ref 98–111)
Creatinine, Ser: 0.69 mg/dL (ref 0.44–1.00)
GFR, Estimated: >60 mL/min (ref >60)
Glucose, Bld: 103 mg/dL — ABNORMAL HIGH (ref 70–99)
Potassium: 4 mmol/L (ref 3.5–5.1)
Sodium: 137 mmol/L (ref 135–145)

## 2021-07-05 LAB — TYPE AND SCREEN
ABO/RH(D): O NEG
Antibody Screen: NEGATIVE

## 2021-07-05 LAB — CBC
HCT: 39.9 % (ref 36.0–46.0)
Hemoglobin: 13.2 g/dL (ref 12.0–15.0)
MCH: 31.6 pg (ref 26.0–34.0)
MCHC: 33.1 g/dL (ref 30.0–36.0)
MCV: 95.5 fL (ref 80.0–100.0)
Platelets: 259 10*3/uL (ref 150–400)
RBC: 4.18 MIL/uL (ref 3.87–5.11)
RDW: 13.2 % (ref 11.5–15.5)
WBC: 7.4 10*3/uL (ref 4.0–10.5)
nRBC: 0 % (ref 0.0–0.2)

## 2021-07-05 NOTE — Progress Notes (Signed)
PCP - Charlott Rakes, MD Cardiologist - denies  PPM/ICD - denies Device Orders - N/A Rep Notified - N/A  Chest x-ray - N/A EKG - 07/26/2020 Stress Test - denies ECHO - 08/20/2020 Cardiac Cath - denies  Sleep Study - denies CPAP - N/A  Fasting Blood Sugar - N/A  Blood Thinner Instructions: N/A  Aspirin Instructions: Patient was instructed: As of today, STOP taking any Aspirin (unless otherwise instructed by your surgeon) Aleve, Naproxen, Ibuprofen, Motrin, Advil, Goody's, BC's, all herbal medications, fish oil, and all vitamins.  ERAS Protcol - No  COVID TEST- No - patient and her mother verbalized that she was tested positive on 04/16/2021 at home; there is no documentation about the test result. Patient doesn't have any symptoms of COVID today. Patient's mother is an Therapist, sports and she was instructed to call Dr. Elgie Congo and ask him if he wants to proceed with the surgery without testing the patient again. If not, patient will be tested the day of surgery.   Anesthesia review: yes; hard to wake up after anesthesia  Patient denies shortness of breath, fever, cough and chest pain at PAT appointment   All instructions explained to the patient, with a verbal understanding of the material. Patient agrees to go over the instructions while at home for a better understanding. Patient also instructed to self quarantine after being tested for COVID-19. The opportunity to ask questions was provided.

## 2021-07-06 ENCOUNTER — Encounter: Payer: Self-pay | Admitting: General Practice

## 2021-07-06 ENCOUNTER — Ambulatory Visit (INDEPENDENT_AMBULATORY_CARE_PROVIDER_SITE_OTHER): Payer: Medicaid Other | Admitting: Clinical

## 2021-07-06 DIAGNOSIS — F909 Attention-deficit hyperactivity disorder, unspecified type: Secondary | ICD-10-CM | POA: Diagnosis not present

## 2021-07-06 DIAGNOSIS — F4310 Post-traumatic stress disorder, unspecified: Secondary | ICD-10-CM

## 2021-07-06 NOTE — Anesthesia Preprocedure Evaluation (Addendum)
Anesthesia Evaluation  Patient identified by MRN, date of birth, ID band Patient awake    Reviewed: Allergy & Precautions, NPO status , Patient's Chart, lab work & pertinent test results  History of Anesthesia Complications (+) history of anesthetic complications  Airway Mallampati: II  TM Distance: >3 FB Neck ROM: Full    Dental  (+) Dental Advisory Given   Pulmonary asthma ,    breath sounds clear to auscultation       Cardiovascular + dysrhythmias  Rhythm:Regular Rate:Normal     Neuro/Psych negative neurological ROS     GI/Hepatic Neg liver ROS, hiatal hernia, GERD  ,  Endo/Other  negative endocrine ROS  Renal/GU negative Renal ROS     Musculoskeletal   Abdominal   Peds  Hematology negative hematology ROS (+)   Anesthesia Other Findings   Reproductive/Obstetrics                             Anesthesia Physical Anesthesia Plan  ASA: 2  Anesthesia Plan: General   Post-op Pain Management:    Induction: Intravenous  PONV Risk Score and Plan: 4 or greater and Scopolamine patch - Pre-op, Midazolam, Dexamethasone, Ondansetron and Treatment may vary due to age or medical condition  Airway Management Planned: Oral ETT  Additional Equipment: None  Intra-op Plan:   Post-operative Plan: Extubation in OR  Informed Consent: I have reviewed the patients History and Physical, chart, labs and discussed the procedure including the risks, benefits and alternatives for the proposed anesthesia with the patient or authorized representative who has indicated his/her understanding and acceptance.     Dental advisory given  Plan Discussed with: CRNA  Anesthesia Plan Comments: (PAT note written 07/06/2021 by Myra Gianotti, PA-C. Reported + home COVID-19 test 04/16/21.  )       Anesthesia Quick Evaluation

## 2021-07-06 NOTE — Progress Notes (Signed)
Anesthesia Chart Review:  Case: [id number] Date/Time: 07/12/21 0715   Procedures:      TOTAL LAPAROSCOPIC HYSTERECTOMY WITH SALPINGECTOMY POSSIBLE OOPHORECTOMY (Bilateral)     CYSTOSCOPY   Anesthesia type: Choice   Pre-op diagnosis: DUB   Location: [facility] OR ROOM 07 / [place] OR   Surgeons: [name], MD       DISCUSSION: Patient is a 35 year old female scheduled for the above procedure.  History includes number smoker, murmur (no significant valve disease 08/2020 echo), ADHD, bipolar disorder, asthma, anemia, GERD, hiatal hernia (large by 04/01/20 EGD), delayed gastric emptying (normal 2 and 4-hour gastric retention 08/26/20), septoplasty/inferior turbinate reduction (10/05/03), diagnostic laparoscopy (01/26/21: endometriosis).  Reported "slow to wake up" after anesthesia.  For urine pregnancy test on the day of surgery.  Both mother and patient confirmed patient had a positive home COVID-19 test on 04/16/2021.  Since this was a home test, there is no formal documentation.  As of 07/05/2021, patient without any concerning symptoms of active COVID.  Patient/mother to contact Dr. Gordy Councilman office if he desired repeat testing prior to surgery.   Anesthesia team to evaluate on the day of surgery.   VS: BP 111/71   Pulse 100   Temp 37 C   Resp 17   Ht 5' 2.6" (1.59 m)   Wt 46.1 kg   SpO2 100%   BMI 18.25 kg/m    PROVIDERS: Charlott Rakes, MD is PCP  - Payton Emerald, MD is cardiologist.  She was seen for palpitations in the fall 2021.  Echo showed normal biventricular function without significant valvular disease and overall unremarkable event monitor with 1 7-beat run of NSVT.  Deferred trial of AV nodal agents giving concern of side effects.  As needed follow-up recommended unless new or worsening symptoms. - Harl Bowie, MD is GI -Bo Merino, MD is rheumatologist.  Last visit 07/29/2020 for follow-up myofascial pain, hypermobility arthralgia, positive ANA (low titer "not  significant" with negative autoimmune work-up.   LABS: Labs reviewed: Acceptable for surgery. (all labs ordered are listed, but only abnormal results are displayed)  Labs Reviewed  BASIC METABOLIC PANEL - Abnormal; Notable for the following components:      Result Value   Glucose, Bld 103 (*)    All other components within normal limits  CBC  TYPE AND SCREEN     IMAGES: CT Abd/pelvis 05/26/21: IMPRESSION: Fatty liver. Normal appendix. No acute abnormality is identified to correspond with the given clinical history.    EKG: 07/26/20: NSR   CV: Echo 08/20/20: IMPRESSIONS   1. Left ventricular ejection fraction, by estimation, is 55 to 60%. The  left ventricle has normal function. The left ventricle has no regional  wall motion abnormalities. Left ventricular diastolic parameters were  normal.   2. Right ventricular systolic function is normal. The right ventricular  size is normal. Tricuspid regurgitation signal is inadequate for assessing  PA pressure.   3. The mitral valve is normal in structure. No evidence of mitral valve  regurgitation.   4. The aortic valve is tricuspid. Aortic valve regurgitation is not  visualized. No aortic stenosis.   5. 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 The inferior vena cava is normal in size with greater than 50%  respiratory variability, suggesting right atrial pressure of 3 mmHg.    Long Term Monitor 08/01/20-08/13/20: Patient had a min HR of 44 bpm, max HR of 159 bpm, and avg HR of 71  bpm Predominant underlying rhythm was sinus. One episode of NSVT occurred lasting 7 beats with a max rate of 156 Isolated PACs were rare (<1.0%), Couplets or PAC Triplets were present. Isolated VEs were rare (<1.0%), No evidence of complete heart block. Triggered events associated with sinus tachycardia.   No evidence of malignant arrhythmias.   Past Medical History:  Diagnosis Date   ADHD  (attention deficit hyperactivity disorder)    Anemia    Anxiety    Asthma    Bipolar disorder ([place])    Complication of anesthesia    slow to wake up    Delayed gastric emptying 02/23/2020   Depression    DUB (dysfunctional uterine bleeding) 02/23/2020   Endometriosis    GERD (gastroesophageal reflux disease)    Heart murmur    Hiatal hernia 04/06/2020   Myalgia 04/06/2020   Parasitic infection 02/23/2020   Pneumonia    Poor appetite 02/23/2020   RUQ pain 02/23/2020   Vitamin D deficiency    Weight loss 02/23/2020    Past Surgical History:  Procedure Laterality Date   barium study     COLONOSCOPY     ESOPHAGOGASTRODUODENOSCOPY  2011   and a ph study as well.    LAPAROSCOPY N/A 01/26/2021   Procedure: LAPAROSCOPY DIAGNOSTIC;  Surgeon: [name], MD;  Location: [place];  Service: Gynecology;  Laterality: N/A;   NASAL SEPTUM SURGERY     TONGUE FLAP     WISDOM TOOTH EXTRACTION      MEDICATIONS:  acetaminophen (TYLENOL) 500 MG tablet   albuterol (VENTOLIN HFA) 108 (90 Base) MCG/ACT inhaler   Ascorbic Acid (VITAMIN C) 1000 MG tablet   Calcium-Magnesium-Vitamin D 600-40-500 MG-MG-UNIT TB24   Cholecalciferol (VITAMIN D3) 50 MCG (2000 UT) TABS   EPINEPHrine 0.3 mg/0.3 mL IJ SOAJ injection   fexofenadine (ALLEGRA) 180 MG tablet   ibuprofen (ADVIL) 200 MG tablet   ibuprofen (ADVIL) 600 MG tablet   linaclotide (LINZESS) 290 MCG CAPS capsule   Melatonin 10 MG TABS   MIRALAX 17 GM/SCOOP powder   Multiple Vitamins-Minerals (ADULT ONE DAILY GUMMIES PO)   Omega-3 Fatty Acids (FISH OIL) 1200 MG CAPS   promethazine (PHENERGAN) 12.5 MG tablet   sennosides-docusate sodium (SENOKOT-S) 8.6-50 MG tablet   triamcinolone (KENALOG) 0.1 %   No current facility-administered medications for this encounter.    Myra Gianotti, PA-C Surgical Short Stay/Anesthesiology [facility] Phone [phone] [facility] Phone [phone] 07/06/2021 2:36 PM

## 2021-07-08 ENCOUNTER — Ambulatory Visit: Payer: Medicaid Other | Admitting: Obstetrics and Gynecology

## 2021-07-11 NOTE — BH Specialist Note (Addendum)
Integrated Behavioral Health via Telemedicine Visit  07/11/2021 Rachel Rachel [name] [id number]  Number of [place] visits: 4 Session Start time: 10:45  Session End time: 11:30 Total time: 45   Referring Provider: Lynnda Shields, MD Patient/Family location: Home [facility] Provider location: Center for [facility]  All persons participating in visit: Patient Rachel Rachel [name] and Rachel Rachel [name]   Types of Service: Individual psychotherapy and Telephone visit  I connected with Rachel Rachel [name] and/or [place]  via  Telephone or Video Enabled Telemedicine Application  (Video is Tree surgeon) and verified that I am speaking with the correct person using two identifiers. Discussed confidentiality: Yes   I discussed the limitations of telemedicine and the availability of in person appointments.  Discussed there is a possibility of technology failure and discussed alternative modes of communication if that failure occurs.  I discussed that engaging in this telemedicine visit, they consent to the provision of behavioral healthcare and the services will be billed under their insurance.  Patient and/or legal guardian expressed understanding and consented to Telemedicine visit: Yes   Presenting Concerns: Patient and/or family reports the following symptoms/concerns: Processing thoughts about healing from surgery; coping with supportive family, implementing physical comfort measures, and hopeful outlook.  Duration of problem: Ongoing; Severity of problem: moderate  Patient and/or Family's Strengths/Protective Factors: Social connections, Social and Emotional competence, Concrete supports in place (healthy food, safe environments, etc.), and Sense of purpose  Goals Addressed: Patient will:  Reduce symptoms of: anxiety and stress   Demonstrate ability to: Increase healthy adjustment to current life  circumstances  Progress towards Goals: Ongoing  Interventions: Interventions utilized:  ACT (Acceptance and Commitment Therapy) Standardized Assessments completed: Not Needed  Patient and/or Family Response: Pt agrees with ongoing treatment  Assessment: Patient currently experiencing ADHD and PTSD (both diagnosed previously by psychiatry).   Patient may benefit from continued psychoeducation and brief therapeutic interventions regarding coping with symptoms of anxiety and life stress .  Rachel [name]: Follow up with behavioral health clinician on : Two weeks Behavioral recommendations:  -Continue daily self-coping strategies, as needed, to continue managing changing emotions due to hormonal fluctuations post-hysterectomy -Continue using new pillow and heated blanket to manage physical discomfort post-surgery Referral(s): [place] (In Clinic)  I discussed the assessment and treatment Rachel [name] with the patient and/or parent/guardian. They were provided an opportunity to ask questions and all were answered. They agreed with the Rachel [name] and demonstrated an understanding of the instructions.   They were advised to call back or seek an in-person evaluation if the symptoms worsen or if the condition fails to improve as anticipated.  [name], LCSW  Depression screen [facility] 2/9 06/20/2021 06/17/2021 05/02/2021 02/21/2021 02/16/2021  Decreased Interest 0 0 0 0 0  Down, Depressed, Hopeless 0 0 0 0 0  PHQ - 2 Score 0 0 0 0 0  Altered sleeping 3 - '2 1 2  '$ Tired, decreased energy 2 - '2 1 2  '$ Change in appetite 3 - '2 2 3  '$ Feeling bad or failure about yourself  0 - 0 0 0  Trouble concentrating 1 - 0 0 2  Moving slowly or fidgety/restless 0 - 0 0 0  Suicidal thoughts 0 - 0 0 0  PHQ-9 Score 9 - '6 4 9  '$ Some recent data might be hidden   GAD 7 : Generalized Anxiety Score 06/20/2021 06/17/2021 05/02/2021 02/21/2021  Nervous, Anxious, on Edge 1 1 0 0  Control/stop worrying 0 1 0 0  Worry  too much - different things 0 0 0 0  Trouble relaxing 1 0 0 0  Restless 0 0 0 0  Easily annoyed or irritable 0 0 0 0  Afraid - awful might happen 0 0 0 0  Total GAD 7 Score 2 2 0 0

## 2021-07-12 ENCOUNTER — Inpatient Hospital Stay (HOSPITAL_COMMUNITY): Payer: Medicaid Other | Admitting: Anesthesiology

## 2021-07-12 ENCOUNTER — Other Ambulatory Visit: Payer: Self-pay

## 2021-07-12 ENCOUNTER — Ambulatory Visit (HOSPITAL_COMMUNITY)
Admission: RE | Admit: 2021-07-12 | Discharge: 2021-07-13 | Disposition: A | Discharge status: Home / Self Care | Payer: Medicaid Other | Attending: Obstetrics and Gynecology | Admitting: Obstetrics and Gynecology

## 2021-07-12 ENCOUNTER — Encounter (HOSPITAL_COMMUNITY)
Admission: RE | Disposition: A | Discharge status: Home / Self Care | Payer: Self-pay | Source: Home / Self Care | Attending: Obstetrics and Gynecology

## 2021-07-12 ENCOUNTER — Encounter (HOSPITAL_COMMUNITY): Payer: Self-pay | Admitting: Obstetrics and Gynecology

## 2021-07-12 ENCOUNTER — Inpatient Hospital Stay (HOSPITAL_COMMUNITY): Payer: Medicaid Other | Admitting: Vascular Surgery

## 2021-07-12 DIAGNOSIS — Z79899 Other long term (current) drug therapy: Secondary | ICD-10-CM | POA: Insufficient documentation

## 2021-07-12 DIAGNOSIS — Z888 Allergy status to other drugs, medicaments and biological substances status: Secondary | ICD-10-CM | POA: Insufficient documentation

## 2021-07-12 DIAGNOSIS — Z880 Allergy status to penicillin: Secondary | ICD-10-CM | POA: Diagnosis not present

## 2021-07-12 DIAGNOSIS — D259 Leiomyoma of uterus, unspecified: Secondary | ICD-10-CM | POA: Diagnosis not present

## 2021-07-12 DIAGNOSIS — Z881 Allergy status to other antibiotic agents status: Secondary | ICD-10-CM | POA: Diagnosis not present

## 2021-07-12 DIAGNOSIS — Z90710 Acquired absence of both cervix and uterus: Secondary | ICD-10-CM | POA: Diagnosis present

## 2021-07-12 DIAGNOSIS — N809 Endometriosis, unspecified: Secondary | ICD-10-CM | POA: Diagnosis not present

## 2021-07-12 HISTORY — PX: CYSTOSCOPY: SHX5120

## 2021-07-12 HISTORY — PX: TOTAL LAPAROSCOPIC HYSTERECTOMY WITH SALPINGECTOMY: SHX6742

## 2021-07-12 LAB — CBC
HCT: 37.7 % (ref 36.0–46.0)
HCT: 32.5 % — ABNORMAL LOW (ref 36.0–46.0)
Hemoglobin: 12.7 g/dL (ref 12.0–15.0)
Hemoglobin: 11.3 g/dL — ABNORMAL LOW (ref 12.0–15.0)
MCH: 30.9 pg (ref 26.0–34.0)
MCH: 31.7 pg (ref 26.0–34.0)
MCHC: 33.7 g/dL (ref 30.0–36.0)
MCHC: 34.8 g/dL (ref 30.0–36.0)
MCV: 91.7 fL (ref 80.0–100.0)
MCV: 91 fL (ref 80.0–100.0)
Platelets: 210 10*3/uL (ref 150–400)
Platelets: 219 10*3/uL (ref 150–400)
RBC: 4.11 MIL/uL (ref 3.87–5.11)
RBC: 3.57 MIL/uL — ABNORMAL LOW (ref 3.87–5.11)
RDW: 13.1 % (ref 11.5–15.5)
RDW: 13.1 % (ref 11.5–15.5)
WBC: 11.4 10*3/uL — ABNORMAL HIGH (ref 4.0–10.5)
WBC: 10.1 10*3/uL (ref 4.0–10.5)
nRBC: 0 % (ref 0.0–0.2)
nRBC: 0 % (ref 0.0–0.2)

## 2021-07-12 LAB — CREATININE, SERUM
Creatinine, Ser: 0.63 mg/dL (ref 0.44–1.00)
GFR, Estimated: >60 mL/min (ref >60)

## 2021-07-12 LAB — POCT PREGNANCY, URINE: Preg Test, Ur: NEGATIVE

## 2021-07-12 SURGERY — HYSTERECTOMY, TOTAL, LAPAROSCOPIC, WITH SALPINGECTOMY
Anesthesia: General | Site: Urethra

## 2021-07-12 MED ORDER — ROPIVACAINE HCL 5 MG/ML IJ SOLN
INTRAMUSCULAR | Status: AC
  Filled 2021-07-12: qty 30

## 2021-07-12 MED ORDER — FENTANYL CITRATE (PF) 250 MCG/5ML IJ SOLN
INTRAMUSCULAR | Status: AC
  Filled 2021-07-12: qty 5

## 2021-07-12 MED ORDER — PHENYLEPHRINE HCL-NACL 20-0.9 MG/250ML-% IV SOLN
INTRAVENOUS | Status: DC | PRN
  Administered 2021-07-12: 40 ug/min via INTRAVENOUS

## 2021-07-12 MED ORDER — ENOXAPARIN SODIUM 40 MG/0.4ML IJ SOSY
PREFILLED_SYRINGE | INTRAMUSCULAR | Status: AC
  Administered 2021-07-12: 40 mg via SUBCUTANEOUS
  Filled 2021-07-12: qty 0.4

## 2021-07-12 MED ORDER — CLINDAMYCIN PHOSPHATE 900 MG/50ML IV SOLN
INTRAVENOUS | Status: AC
  Filled 2021-07-12: qty 50

## 2021-07-12 MED ORDER — ONDANSETRON HCL 4 MG/2ML IJ SOLN
INTRAMUSCULAR | Status: DC | PRN
  Administered 2021-07-12: 4 mg via INTRAVENOUS

## 2021-07-12 MED ORDER — ALBUTEROL SULFATE (2.5 MG/3ML) 0.083% IN NEBU: 2.5000 mg | INHALATION_SOLUTION | Freq: Four times a day (QID) | RESPIRATORY_TRACT | Status: DC | PRN

## 2021-07-12 MED ORDER — FENTANYL CITRATE (PF) 100 MCG/2ML IJ SOLN
INTRAMUSCULAR | Status: DC | PRN
  Administered 2021-07-12: 100 ug via INTRAVENOUS
  Administered 2021-07-12: 50 ug via INTRAVENOUS

## 2021-07-12 MED ORDER — LACTATED RINGERS IV SOLN: INTRAVENOUS | Status: DC

## 2021-07-12 MED ORDER — SOD CITRATE-CITRIC ACID 500-334 MG/5ML PO SOLN
ORAL | Status: AC
  Administered 2021-07-12: 30 mL via ORAL
  Filled 2021-07-12: qty 30

## 2021-07-12 MED ORDER — BISACODYL 5 MG PO TBEC: 5.0000 mg | DELAYED_RELEASE_TABLET | Freq: Every day | ORAL | Status: DC | PRN

## 2021-07-12 MED ORDER — SOD CITRATE-CITRIC ACID 500-334 MG/5ML PO SOLN: 30.0000 mL | ORAL | Status: AC

## 2021-07-12 MED ORDER — KETAMINE HCL 50 MG/5ML IJ SOSY
PREFILLED_SYRINGE | INTRAMUSCULAR | Status: AC
  Filled 2021-07-12: qty 5

## 2021-07-12 MED ORDER — ONDANSETRON HCL 4 MG/2ML IJ SOLN
4.0000 mg | Freq: Four times a day (QID) | INTRAMUSCULAR | Status: DC | PRN
  Administered 2021-07-12: 4 mg via INTRAVENOUS
  Filled 2021-07-12: qty 2

## 2021-07-12 MED ORDER — ENOXAPARIN SODIUM 40 MG/0.4ML IJ SOSY
40.0000 mg | PREFILLED_SYRINGE | INTRAMUSCULAR | Status: DC
  Administered 2021-07-12: 40 mg via SUBCUTANEOUS
  Filled 2021-07-12: qty 0.4

## 2021-07-12 MED ORDER — ROCURONIUM BROMIDE 100 MG/10ML IV SOLN
INTRAVENOUS | Status: DC | PRN
Start: 2021-07-12 — End: 2021-07-12
  Administered 2021-07-12: 50 mg via INTRAVENOUS

## 2021-07-12 MED ORDER — POVIDONE-IODINE 10 % EX SWAB
2.0000 "application " | Freq: Once | CUTANEOUS | Status: AC
  Administered 2021-07-12: 2 via TOPICAL

## 2021-07-12 MED ORDER — ACETAMINOPHEN 500 MG PO TABS: 1000.0000 mg | ORAL_TABLET | ORAL | Status: AC

## 2021-07-12 MED ORDER — ENOXAPARIN SODIUM 40 MG/0.4ML IJ SOSY: 40.0000 mg | PREFILLED_SYRINGE | INTRAMUSCULAR | Status: AC

## 2021-07-12 MED ORDER — BUPIVACAINE HCL (PF) 0.25 % IJ SOLN
INTRAMUSCULAR | Status: AC
  Filled 2021-07-12: qty 30

## 2021-07-12 MED ORDER — FENTANYL CITRATE (PF) 100 MCG/2ML IJ SOLN
INTRAMUSCULAR | Status: AC
  Filled 2021-07-12: qty 2

## 2021-07-12 MED ORDER — OXYCODONE HCL 5 MG PO TABS
5.0000 mg | ORAL_TABLET | ORAL | Status: DC | PRN
  Administered 2021-07-12 – 2021-07-13 (×4): 10 mg via ORAL
  Filled 2021-07-12 (×4): qty 2

## 2021-07-12 MED ORDER — KETAMINE HCL 10 MG/ML IJ SOLN
INTRAMUSCULAR | Status: DC | PRN
  Administered 2021-07-12: 20 mg via INTRAVENOUS

## 2021-07-12 MED ORDER — DOCUSATE SODIUM 100 MG PO CAPS
100.0000 mg | ORAL_CAPSULE | Freq: Two times a day (BID) | ORAL | Status: DC
  Administered 2021-07-12 (×2): 100 mg via ORAL
  Filled 2021-07-12 (×2): qty 1

## 2021-07-12 MED ORDER — STERILE WATER FOR IRRIGATION IR SOLN
Status: DC | PRN
  Administered 2021-07-12: 1000 mL

## 2021-07-12 MED ORDER — SUGAMMADEX SODIUM 200 MG/2ML IV SOLN
INTRAVENOUS | Status: DC | PRN
  Administered 2021-07-12: 200 mg via INTRAVENOUS

## 2021-07-12 MED ORDER — SCOPOLAMINE 1 MG/3DAYS TD PT72
MEDICATED_PATCH | TRANSDERMAL | Status: DC | PRN
  Administered 2021-07-12: 1 via TRANSDERMAL

## 2021-07-12 MED ORDER — AMISULPRIDE (ANTIEMETIC) 5 MG/2ML IV SOLN
10.0000 mg | Freq: Once | INTRAVENOUS | Status: AC | PRN
  Administered 2021-07-12: 10 mg via INTRAVENOUS

## 2021-07-12 MED ORDER — LIDOCAINE 2% (20 MG/ML) 5 ML SYRINGE
INTRAMUSCULAR | Status: DC | PRN
Start: 2021-07-12 — End: 2021-07-12
  Administered 2021-07-12: 40 mg via INTRAVENOUS

## 2021-07-12 MED ORDER — SODIUM CHLORIDE 0.9 % IR SOLN
Status: DC | PRN
  Administered 2021-07-12: 1000 mL

## 2021-07-12 MED ORDER — HEMOSTATIC AGENTS (NO CHARGE) OPTIME
TOPICAL | Status: DC | PRN
  Administered 2021-07-12: 1 via TOPICAL

## 2021-07-12 MED ORDER — FENTANYL CITRATE (PF) 100 MCG/2ML IJ SOLN
25.0000 ug | INTRAMUSCULAR | Status: DC | PRN
  Administered 2021-07-12 (×3): 25 ug via INTRAVENOUS

## 2021-07-12 MED ORDER — PHENYLEPHRINE HCL (PRESSORS) 10 MG/ML IV SOLN
INTRAVENOUS | Status: DC | PRN
  Administered 2021-07-12: 140 ug via INTRAVENOUS
  Administered 2021-07-12: 100 ug via INTRAVENOUS
  Administered 2021-07-12 (×2): 80 ug via INTRAVENOUS

## 2021-07-12 MED ORDER — SODIUM CHLORIDE (PF) 0.9 % IJ SOLN
INTRAMUSCULAR | Status: AC
  Filled 2021-07-12: qty 50

## 2021-07-12 MED ORDER — CHLORHEXIDINE GLUCONATE 0.12 % MT SOLN
15.0000 mL | Freq: Once | OROMUCOSAL | Status: AC
  Administered 2021-07-12: 15 mL via OROMUCOSAL
  Filled 2021-07-12: qty 15

## 2021-07-12 MED ORDER — ACETAMINOPHEN 500 MG PO TABS
ORAL_TABLET | ORAL | Status: AC
  Administered 2021-07-12: 1000 mg via ORAL
  Filled 2021-07-12: qty 2

## 2021-07-12 MED ORDER — ACETAMINOPHEN 325 MG PO TABS
650.0000 mg | ORAL_TABLET | ORAL | Status: DC | PRN
  Administered 2021-07-12: 650 mg via ORAL
  Filled 2021-07-12: qty 2

## 2021-07-12 MED ORDER — CLINDAMYCIN PHOSPHATE 900 MG/50ML IV SOLN
900.0000 mg | Freq: Once | INTRAVENOUS | Status: AC
  Administered 2021-07-12: 900 mg via INTRAVENOUS

## 2021-07-12 MED ORDER — BUPIVACAINE HCL (PF) 0.25 % IJ SOLN
INTRAMUSCULAR | Status: DC | PRN
  Administered 2021-07-12: 14 mL

## 2021-07-12 MED ORDER — ONDANSETRON HCL 4 MG PO TABS: 4.0000 mg | ORAL_TABLET | Freq: Four times a day (QID) | ORAL | Status: DC | PRN

## 2021-07-12 MED ORDER — DEXAMETHASONE SODIUM PHOSPHATE 10 MG/ML IJ SOLN
INTRAMUSCULAR | Status: DC | PRN
  Administered 2021-07-12: 10 mg via INTRAVENOUS

## 2021-07-12 MED ORDER — PROPOFOL 10 MG/ML IV BOLUS
INTRAVENOUS | Status: DC | PRN
  Administered 2021-07-12: 200 mg via INTRAVENOUS

## 2021-07-12 MED ORDER — CIPROFLOXACIN IN D5W 400 MG/200ML IV SOLN
400.0000 mg | INTRAVENOUS | Status: AC
  Administered 2021-07-12: 400 mg via INTRAVENOUS

## 2021-07-12 MED ORDER — ORAL CARE MOUTH RINSE: 15.0000 mL | Freq: Once | OROMUCOSAL | Status: AC

## 2021-07-12 MED ORDER — LORATADINE 10 MG PO TABS: 10.0000 mg | ORAL_TABLET | Freq: Every day | ORAL | Status: DC

## 2021-07-12 MED ORDER — AMISULPRIDE (ANTIEMETIC) 5 MG/2ML IV SOLN
INTRAVENOUS | Status: AC
  Filled 2021-07-12: qty 4

## 2021-07-12 MED ORDER — PROPOFOL 10 MG/ML IV BOLUS
INTRAVENOUS | Status: AC
  Filled 2021-07-12: qty 40

## 2021-07-12 MED ORDER — MIDAZOLAM HCL 5 MG/5ML IJ SOLN
INTRAMUSCULAR | Status: DC | PRN
  Administered 2021-07-12: 2 mg via INTRAVENOUS

## 2021-07-12 MED ORDER — KETOROLAC TROMETHAMINE 15 MG/ML IJ SOLN
INTRAMUSCULAR | Status: DC | PRN
  Administered 2021-07-12: 15 mg via INTRAVENOUS

## 2021-07-12 MED ORDER — CIPROFLOXACIN IN D5W 400 MG/200ML IV SOLN
INTRAVENOUS | Status: AC
  Filled 2021-07-12: qty 200

## 2021-07-12 MED ORDER — SIMETHICONE 80 MG PO CHEW: 80.0000 mg | CHEWABLE_TABLET | Freq: Four times a day (QID) | ORAL | Status: DC | PRN

## 2021-07-12 MED ORDER — MIDAZOLAM HCL 2 MG/2ML IJ SOLN
INTRAMUSCULAR | Status: AC
  Filled 2021-07-12: qty 2

## 2021-07-12 MED ORDER — MORPHINE SULFATE (PF) 2 MG/ML IV SOLN
2.0000 mg | INTRAVENOUS | Status: DC | PRN
  Administered 2021-07-12 (×2): 2 mg via INTRAVENOUS
  Filled 2021-07-12 (×2): qty 1

## 2021-07-12 SURGICAL SUPPLY — 53 items
APPLICATOR ARISTA FLEXITIP XL (MISCELLANEOUS) ×3 IMPLANT
CABLE HIGH FREQUENCY MONO STRZ (ELECTRODE) IMPLANT
COVER MAYO STAND STRL (DRAPES) ×3 IMPLANT
COVER SURGICAL LIGHT HANDLE (MISCELLANEOUS) ×3 IMPLANT
DERMABOND ADVANCED (GAUZE/BANDAGES/DRESSINGS) ×1
DERMABOND ADVANCED .7 DNX12 (GAUZE/BANDAGES/DRESSINGS) ×2 IMPLANT
DURAPREP 26ML APPLICATOR (WOUND CARE) ×3 IMPLANT
GLOVE SURG LTX SZ6.5 (GLOVE) ×6 IMPLANT
GLOVE SURG UNDER POLY LF SZ7 (GLOVE) ×12 IMPLANT
GOWN STRL REUS W/ TWL LRG LVL3 (GOWN DISPOSABLE) ×8 IMPLANT
GOWN STRL REUS W/TWL LRG LVL3 (GOWN DISPOSABLE) ×12
HEMOSTAT ARISTA ABSORB 3G PWDR (HEMOSTASIS) ×3 IMPLANT
HIBICLENS CHG 4% 4OZ BTL (MISCELLANEOUS) ×3 IMPLANT
IRRIG SUCT STRYKERFLOW 2 WTIP (MISCELLANEOUS) ×3
IRRIGATION SUCT STRKRFLW 2 WTP (MISCELLANEOUS) ×2 IMPLANT
KIT TURNOVER KIT B (KITS) ×3 IMPLANT
LIGASURE VESSEL 5MM BLUNT TIP (ELECTROSURGICAL) ×3 IMPLANT
NEEDLE INSUFFLATION 14GA 120MM (NEEDLE) IMPLANT
NS IRRIG 1000ML POUR BTL (IV SOLUTION) ×3 IMPLANT
OCCLUDER COLPOPNEUMO (BALLOONS) ×3 IMPLANT
PACK LAPAROSCOPY BASIN (CUSTOM PROCEDURE TRAY) ×3 IMPLANT
PACK TRENDGUARD 450 HYBRID PRO (MISCELLANEOUS) ×2 IMPLANT
POUCH LAPAROSCOPIC INSTRUMENT (MISCELLANEOUS) ×3 IMPLANT
PROTECTOR NERVE ULNAR (MISCELLANEOUS) ×6 IMPLANT
SCISSORS LAP 5X35 DISP (ENDOMECHANICALS) IMPLANT
SET CYSTO W/LG BORE CLAMP LF (SET/KITS/TRAYS/PACK) ×3 IMPLANT
SET IRRIG TUBING LAPAROSCOPIC (IRRIGATION / IRRIGATOR) IMPLANT
SET TRI-LUMEN FLTR TB AIRSEAL (TUBING) IMPLANT
SET TUBE SMOKE EVAC HIGH FLOW (TUBING) ×3 IMPLANT
SHEARS HARMONIC ACE PLUS 36CM (ENDOMECHANICALS) ×3 IMPLANT
SLEEVE ADV FIXATION 5X100MM (TROCAR) ×6 IMPLANT
SUT VIC AB 0 CT1 27 (SUTURE) ×6
SUT VIC AB 0 CT1 27XBRD ANBCTR (SUTURE) ×4 IMPLANT
SUT VICRYL 0 UR6 27IN ABS (SUTURE) ×6 IMPLANT
SUT VICRYL 4-0 PS2 18IN ABS (SUTURE) ×3 IMPLANT
SUT VLOC 180 0 9IN  GS21 (SUTURE) ×3
SUT VLOC 180 0 9IN GS21 (SUTURE) ×2 IMPLANT
SYR 10ML LL (SYRINGE) ×3 IMPLANT
SYR 50ML LL SCALE MARK (SYRINGE) ×6 IMPLANT
TIP UTERINE 5.1X6CM LAV DISP (MISCELLANEOUS) IMPLANT
TIP UTERINE 6.7X10CM GRN DISP (MISCELLANEOUS) IMPLANT
TIP UTERINE 6.7X6CM WHT DISP (MISCELLANEOUS) ×3 IMPLANT
TIP UTERINE 6.7X8CM BLUE DISP (MISCELLANEOUS) ×3 IMPLANT
TOWEL GREEN STERILE FF (TOWEL DISPOSABLE) ×6 IMPLANT
TRAY FOLEY W/BAG SLVR 14FR (SET/KITS/TRAYS/PACK) ×3 IMPLANT
TRENDGUARD 450 HYBRID PRO PACK (MISCELLANEOUS) ×3
TROCAR ADV FIXATION 5X100MM (TROCAR) ×3 IMPLANT
TROCAR BALLN GELPORT 12X130M (ENDOMECHANICALS) ×3 IMPLANT
TROCAR PORT AIRSEAL 5X120 (TROCAR) IMPLANT
TROCAR XCEL NON BLADE 8MM B8LT (ENDOMECHANICALS) ×3 IMPLANT
TROCAR XCEL NON-BLD 5MMX100MML (ENDOMECHANICALS) IMPLANT
UNDERPAD 30X36 HEAVY ABSORB (UNDERPADS AND DIAPERS) ×3 IMPLANT
WARMER LAPAROSCOPE (MISCELLANEOUS) ×3 IMPLANT

## 2021-07-12 NOTE — Anesthesia Procedure Notes (Signed)
Procedure Name: Intubation Date/Time: 07/12/2021 7:45 AM Performed by: Justin Mend, RN Pre-anesthesia Checklist: Patient identified, Emergency Drugs available, Suction available and Patient being monitored Patient Re-evaluated:Patient Re-evaluated prior to induction Oxygen Delivery Method: Circle system utilized Preoxygenation: Pre-oxygenation with 100% oxygen Induction Type: IV induction and Cricoid Pressure applied Ventilation: Mask ventilation without difficulty Laryngoscope Size: Mac and 3 Grade View: Grade I Tube type: Oral Tube size: 6.5 mm Number of attempts: 1 Airway Equipment and Method: Stylet and Oral airway Placement Confirmation: ETT inserted through vocal cords under direct vision, positive ETCO2 and breath sounds checked- equal and bilateral Tube secured with: Tape Dental Injury: Teeth and Oropharynx as per pre-operative assessment

## 2021-07-12 NOTE — Op Note (Addendum)
Preoperative diagnosis: pelvic pain, laparoscopic diagnosed endometriosis Postoperative Diagnosis: same Procedure: Total laparoscopic hysterectomy, bilateral salpingo-oophorectomy, cystoscopy Anesthesia: General IVF: 1500 ml UOP: 100 ml EBL: 25 ml Complications: none immediate Surgeon: Lynnda Shields, MD Assistant: Hale Bogus, MD  An experienced assistant was required given the standard of surgical care given the complexity of the case.  This assistant was needed for exposure, dissection, suctioning, retraction, instrument exchange,  general expertise and for overall help during the procedure.   Patient is taken to the operating room. She is placed in the supine position. She is a running IV in place. Informed consent was present on the chart. SCDs on her lower extremities and functioning properly. Patient was positioned .  Her legs were placed in the low lithotomy position in [place]. Her arms were tucked by the side.  General endotracheal anesthesia was administered by the anesthesia staff without difficulty. Dr. Ola Spurr, anesthesia, oversaw case.  Time out performed.    Chlora prep was then used to prep the abdomen and hibiclens was used to prep the inner thighs, perineum and vagina. Once 3 minutes had past the patient was draped in a normal standard fashion. The legs were lifted to the high lithotomy position. The cervix was visualized by placing a heavy weighted speculum in the posterior aspect of the vagina and using a curved Deaver retractor to the retract anteriorly. The anterior lip of the cervix was grasped with single-tooth tenaculum.  The cervix sounded to 8 cm. Pratt dilators were used to dilate the cervix up to a #25. A RUMI uterine manipulator was obtained. A #8disposable tip was placed on the RUMI manipulator as well as a 3, silver KOH ring. This was passed through the cervix and the bulb of the disposable tip was inflated with 10 cc of normal saline. There was a good fit of  the KOH ring around the cervix. The tenaculum was removed. There is also good manipulation of the uterus. The speculum and retractor were removed as well. A Foley catheter was placed to straight drain.  Clear urine was noted. Legs were lowered to the low lithotomy position and attention was turned the abdomen.  The umbilicus was everted.  Open entry was completed in sterile surgical fashion with the [facility] technique.  The 10 mm port was secured and the abdomen was insufflated with 15 mm Hg pressure of CO2 gas. Findings included multiple implants of endometriosis in the bladder flap, ovaries and cul de sac. .  Locations for RLQ, LLQ, and suprapubic ports were noted by transillumination of the abdominal wall.  0.25% marcaine was used to anesthetize the skin.  5 mm skin incision was made in the left lower quadrant .  Then a 70m skin incision was made and a 562mnonbladed trochar and port was placed in the right lower quadrant.  Finally, and 23m12mkin incision was made about 4cm above the pubic symphasis and an 23mm72mn-bladed port was placed with direct visualization of the laparoscope.  All trochars were removed.    Ureters were identifies.  Attention was turned to the left side. With uterus on stretch the left infundibulopelvic ligament was isolated after visualization of the left ureter.  The ligament was cauterized and separated.  Dissection was carried down until the round ligament was encountered. Left round ligament was serially clamped cauterized and incised. The anterior and posterior peritoneum of the inferior leaf of the broad ligament were opened. The beginning of the bladder flap was created.  The bladder was  taken down below the level of the KOH ring. The left uterine artery skeletonized and then just superior to the KOH ring this vessel was serially clamped, cauterized, and incised.  Attention was turned the right side.  The uterus was placed on stretch to the opposite side.  The right  infundibulopelvic ligament was again isolated after the ureter was identified and its course was followed.  It was cauterized and then incised.  Dissection was carried down until the round ligament was encountered. Next the right round ligament was serially clamped cauterized and incised. The anterior posterior peritoneum of the inferiorly for the broad ligament were opened. The anterior peritoneum was carried across to the dissection on the left side. The remainder of the bladder flap was created using sharp dissection. The bladder was well below the level of the KOH ring. The left uterine artery skeletonized. Then the left uterine artery, above the level of the KOH ring, was serially clamped cauterized and incised. The uterus was devascularized at this point.  The colpotomy was performed a starting in the midline and using a harmonic scalpel with the inferior edge of the open blade  This was carried around a circumferential fashion until the vaginal mucosa was completely incised in the specimen was freed.  The specimen was then delivered to the vagina.  A vaginal occlusive device was used to maintain the pneumoperitoneum  Instruments were changed with a needle driver and Kobra graspers.  Using a 9 inch V. lock suture, the cuff was closed by incorporating the anterior and posterior vaginal mucosa in each stitch. This was carried across all the way to the left corner and a running fashion. Two stitches were brought back towards the midline and the suture was cut flush with the vagina. The needle was brought out the pelvis. The pelvis was irrigated. All pedicles were inspected. No bleeding was noted. In Interceed was placed across vaginal cuff. Ureters were noted deep in the pelvis to be peristalsing.  At this point the procedure was completed. The largest port was closed with a fascial closure device after the port was removed.  Suture was tied and excellent closure of the fascia was noted.  The remaining  instruments were removed.  The ports were removed under direct visualization of the laparoscope and the pneumoperitoneum was relieved.  The patient was taken out of Trendelenburg positioning.  Several deep breaths were given to the patient's trying to any gas the abdomen and finally the midline port was removed.  The skin was then closed with subcuticular stitches of 3-0 Vicryl. The skin was cleansed Dermabond was applied. Attention was then turned the vagina and the cuff was inspected. No bleeding was noted. The anterior posterior vaginal mucosa was incorporated in each stitch. The Foley catheter was removed.  Cystoscopy was performed.  No sutures or bladder injuries were noted.  Ureters were noted with normal urine jets from each one was seen.  Foley was left out after the cystoscopic fluid was drained and cystoscope removed.  Sponge, lap, needle, initially counts were correct x2. Patient tolerated the procedure very well. She was awakened from anesthesia, extubated and taken to recovery in stable condition.      Lynnda Shields, MD Faculty attending, center for [facility]

## 2021-07-12 NOTE — Transfer of Care (Signed)
Immediate Anesthesia Transfer of Care Note  Patient: Rachel [name]  Procedure(s) Performed: TOTAL LAPAROSCOPIC HYSTERECTOMY WITH SALPINGECTOMY, OOPHORECTOMY (Bilateral: Abdomen) CYSTOSCOPY (Urethra)  Patient Location: PACU  Anesthesia Type:General  Level of Consciousness: awake and drowsy  Airway & Oxygen Therapy: Patient Spontanous Breathing  Post-op Assessment: Report given to RN and Post -op Vital signs reviewed and stable  Post vital signs: Reviewed  Last Vitals:  Vitals Value Taken Time  BP 110/80 07/12/21 1023  Temp 36.5 C 07/12/21 1021  Pulse 54 07/12/21 1025  Resp 10 07/12/21 1025  SpO2 100 % 07/12/21 1025  Vitals shown include unvalidated device data.  Last Pain:  Vitals:   07/12/21 0609  TempSrc:   PainSc: 7          Complications: No notable events documented.

## 2021-07-12 NOTE — Anesthesia Postprocedure Evaluation (Signed)
Anesthesia Post Note  Patient: Rachel [name]  Procedure(s) Performed: TOTAL LAPAROSCOPIC HYSTERECTOMY WITH SALPINGECTOMY, OOPHORECTOMY (Bilateral: Abdomen) CYSTOSCOPY (Urethra)     Patient location during evaluation: PACU Anesthesia Type: General Level of consciousness: patient cooperative, sedated and oriented Pain management: pain level controlled Vital Signs Assessment: post-procedure vital signs reviewed and stable Respiratory status: spontaneous breathing, nonlabored ventilation, respiratory function stable and patient connected to nasal cannula oxygen Cardiovascular status: blood pressure returned to baseline and stable Postop Assessment: no apparent nausea or vomiting Anesthetic complications: no   No notable events documented.  Last Vitals:  Vitals:   07/12/21 1106 07/12/21 1121  BP: 117/80 125/81  Pulse: (!) 53 (!) 57  Resp: (!) 9 12  Temp:    SpO2: 97% 100%    Last Pain:  Vitals:   07/12/21 1121  TempSrc:   PainSc: Asleep                 Ulis Kaps,E. Sheela Mcculley

## 2021-07-12 NOTE — Interval H&P Note (Signed)
History and Physical Interval Note:  07/12/2021 7:21 AM  Rachel [name] Plan  has presented today for surgery, with the diagnosis of DUB.  The various methods of treatment have been discussed with the patient and family. After consideration of risks, benefits and other options for treatment, the patient has consented to  Procedure(s): TOTAL LAPAROSCOPIC HYSTERECTOMY WITH SALPINGECTOMY POSSIBLE OOPHORECTOMY (Bilateral) CYSTOSCOPY (N/A) as a surgical intervention.  The patient's history has been reviewed, patient examined, no change in status, stable for surgery.  I have reviewed the patient's chart and labs.  Questions were answered to the patient's satisfaction.     Rachel [name]

## 2021-07-13 ENCOUNTER — Encounter (HOSPITAL_COMMUNITY): Payer: Self-pay | Admitting: Obstetrics and Gynecology

## 2021-07-13 DIAGNOSIS — D259 Leiomyoma of uterus, unspecified: Secondary | ICD-10-CM | POA: Diagnosis not present

## 2021-07-13 DIAGNOSIS — Z90710 Acquired absence of both cervix and uterus: Secondary | ICD-10-CM

## 2021-07-13 DIAGNOSIS — Z48816 Encounter for surgical aftercare following surgery on the genitourinary system: Secondary | ICD-10-CM

## 2021-07-13 LAB — SURGICAL PATHOLOGY

## 2021-07-13 MED ORDER — OXYCODONE-ACETAMINOPHEN 5-325 MG PO TABS: 1.0000 | ORAL_TABLET | ORAL | 0 refills | Status: AC | PRN

## 2021-07-13 MED ORDER — DIPHENHYDRAMINE HCL 25 MG PO CAPS
25.0000 mg | ORAL_CAPSULE | Freq: Once | ORAL | Status: AC
  Administered 2021-07-13: 25 mg via ORAL
  Filled 2021-07-13: qty 1

## 2021-07-13 MED ORDER — IBUPROFEN 600 MG PO TABS: 600.0000 mg | ORAL_TABLET | Freq: Four times a day (QID) | ORAL | 2 refills | Status: DC | PRN

## 2021-07-13 MED ORDER — ONDANSETRON HCL 4 MG PO TABS: 4.0000 mg | ORAL_TABLET | Freq: Four times a day (QID) | ORAL | 0 refills | Status: DC | PRN

## 2021-07-13 NOTE — Progress Notes (Signed)
Gynecology Progress Note  Admission Date: 07/12/2021 Current Date: 07/13/2021 7:40 AM  Rachel [name] is a 35 y.o. G0P0000 POD 1 admitted for total laparoscopic hysterectomy and BSO.   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   Palpitations 07/26/2020   Family history of connective tissue disease 07/26/2020   Myalgia 04/06/2020   Hiatal hernia 04/06/2020   Parasitic infection 02/23/2020   Weight loss 02/23/2020   Poor appetite 02/23/2020   RUQ pain 02/23/2020   Delayed gastric emptying 02/23/2020   DUB (dysfunctional uterine bleeding) 02/23/2020   Cervical radiculopathy 05/31/2018   Hyperalgesia 05/31/2018   Acid reflux 12/26/2017   ADHD (attention deficit hyperactivity disorder) 05/07/2012   Bipolar 1 disorder ([place]) 05/07/2012    ROS and patient/family/surgical history, located on admission H&P note dated 07/12/2021, have been reviewed, and there are no changes except as noted below Yesterday/Overnight Events:  none  Subjective:  Pt is doing well.  She has urinated multiple times and is passing gas.  She has tolerated regular diet and pain is controlled using oral medication.  She denies vasomotor symptoms currently.  Objective:   Vitals:   07/12/21 1657 07/12/21 2025 07/13/21 0006 07/13/21 0419  BP: 105/72 (!) 90/53 (!) 100/54 101/62  Pulse: 62 61 (!) 58 (!) 57  Resp: '18 16 16 16  '$ Temp: 98.1 F (36.7 C) 99.6 F (37.6  C) 98.1 F (36.7 C) 98.9 F (37.2 C)  TempSrc: Oral Oral Oral Oral  SpO2: 100% 99% 98% 100%  Weight:      Height:        Temp:  [97.5 F (36.4 C)-99.6 F (37.6 C)] 98.9 F (37.2 C) (08/31 0419) Pulse Rate:  [53-64] 57 (08/31 0419) Resp:  [9-18] 16 (08/31 0419) BP: (90-125)/(53-95) 101/62 (08/31 0419) SpO2:  [95 %-100 %] 100 % (08/31 0419) I/O last 3 completed shifts: In: 3430.7 [P.O.:478; I.V.:2952.7] Out: 1125 [Urine:1100; Blood:25] No intake/output data recorded.  Intake/Output Summary (Last 24 hours) at 07/13/2021 0740 Last data filed at 07/13/2021 0636 Gross per 24 hour  Intake 3430.67 ml  Output 1125 ml  Net 2305.67 ml     Current Vital Signs 24h Vital Sign Ranges  T 98.9 F (37.2 C) Temp  Avg: 98.4 F (36.9 C)  Min: 97.5 F (36.4 C)  Max: 99.6 F (37.6 C)  BP 101/62 BP  Min: 90/53  Max: 125/81  HR (!) 57  Pulse  Avg: 58.5  Min: 53  Max: 64  RR 16 Resp  Avg: 13.8  Min: 9  Max: 18  SaO2 100 % Room Air SpO2  Avg: 98.8 %  Min: 95 %  Max: 100 %       24 Hour I/O Current Shift I/O  Time Ins Outs 08/30 0701 - 08/31 0700 In: 3430.7 [P.O.:478; I.V.:2952.7] Out: 1125 [Urine:1100] No intake/output data recorded.   Patient Vitals for the past 12 hrs:  BP Temp Temp src Pulse Resp SpO2  07/13/21 0419 101/62 98.9 F (37.2 C) Oral (!) 57 16 100 %  07/13/21 0006 (!) 100/54 98.1 F (36.7 C) Oral (!) 58 16 98 %  07/12/21 2025 (!) 90/53 99.6 F (37.6 C) Oral 61 16 99 %     Patient Vitals for the past 24 hrs:  BP Temp Temp src Pulse Resp SpO2  07/13/21 0419 101/62 98.9 F (37.2 C) Oral (!) 57 16 100 %  07/13/21 0006 (!) 100/54 98.1 F (36.7 C) Oral (!) 58 16 98 %  07/12/21 2025 (!) 90/53 99.6 F (37.6 C) Oral 61 16 99 %  07/12/21 1657 105/72 98.1 F (36.7 C) Oral 62 18 100 %  07/12/21 1259 110/67 98.7 F (37.1 C) Oral 64 16 100 %  07/12/21 1200 -- -- -- -- -- 95 %  07/12/21 1155 109/75 98.5 F (36.9 C) Oral (!) 56 14 --  07/12/21 1136 118/80 (!) 97.5 F  (36.4 C) -- (!) 56 11 100 %  07/12/21 1121 125/81 -- -- (!) 57 12 100 %  07/12/21 1106 117/80 -- -- (!) 53 (!) 9 97 %  07/12/21 1051 (!) 119/95 -- -- 62 10 97 %  07/12/21 1036 99/85 -- -- (!) 58 17 100 %  07/12/21 1021 110/80 97.7 F (36.5 C) -- (!) 58 10 100 %    Physical exam: General appearance: alert, cooperative, appears stated age, and no distress Abdomen: soft, non-tender; bowel sounds normal; no masses,  no organomegaly GU: No gross VB Lungs: clear to auscultation bilaterally Heart: regular rate and rhythm Extremities: no lower extremity edema Skin: wounds clean dry and intact, no pallor Psych: appropriate Neurologic: Grossly normal  Medications Current Facility-Administered Medications  Medication Dose Route Frequency Provider Last Rate Last Admin   acetaminophen (TYLENOL) tablet 650 mg  650 mg Oral Q4H PRN [name], MD   650 mg at 07/12/21 2037   albuterol (PROVENTIL) (2.5 MG/3ML) 0.083% nebulizer solution 2.5 mg  2.5 mg Inhalation Q6H PRN [name], MD       bisacodyl (DULCOLAX) EC tablet 5 mg  5 mg Oral Daily PRN [name], MD       docusate sodium (COLACE) capsule 100 mg  100 mg Oral BID [name], MD   100 mg at 07/12/21 2217   enoxaparin (LOVENOX) injection 40 mg  40 mg Subcutaneous Q24H [name], MD   40 mg at 07/12/21 2216   lactated ringers infusion   Intravenous Continuous [name], MD 100 mL/hr at 07/13/21 [id number] New Bag at 07/13/21 0642   loratadine (CLARITIN) tablet 10 mg  10 mg Oral Daily [name], MD       morphine 2 MG/ML injection 2 mg  2 mg Intravenous Q2H PRN [name], MD   2 mg at 07/12/21 1542   ondansetron (ZOFRAN) tablet 4 mg  4 mg Oral Q6H PRN [name], MD       Or   ondansetron [facility]) injection 4 mg  4 mg Intravenous Q6H PRN [name], MD   4 mg at 07/12/21 1315   oxyCODONE (Oxy IR/ROXICODONE) immediate release tablet 5-10 mg  5-10 mg Oral Q4H PRN [name], MD   10  mg at 07/13/21 [id number]   simethicone (MYLICON) chewable tablet 80 mg  80 mg Oral QID PRN [name], MD          Labs  Recent Labs  Lab 07/12/21 1230 07/12/21 1715  WBC 11.4* 10.1  HGB 12.7 11.3*  HCT 37.7 32.5*  PLT  210 219    Recent Labs  Lab 07/12/21 1230  CREATININE 0.63    Radiology N/a  Assessment & Plan:  POD 1 s/p TLH with BSO *GYN: appropriate post op status, pt doing well *Pain: well controlled with oral medication *FEN/GI: bowel fxn appropriate *Dispo: d/c home  Code Status: Full Code  Total time taking care of the patient was 10 minutes, with greater than 50% of the time spent in face to face interaction with the patient.  Lynnda Shields, MD Attending Center for [facility])

## 2021-07-13 NOTE — Discharge Summary (Signed)
Physician Discharge Summary  Patient ID: Rachel [name] MRN: [id number] DOB/AGE: Feb 20, 1986 34 y.o.  Admit date: 07/12/2021 Discharge date: 07/13/2021  Admission Diagnoses: symptomatic endometriosis  Discharge Diagnoses:  Active Problems:   Endometriosis determined by laparoscopy   S/P laparoscopic hysterectomy   Discharged Condition: good  Hospital Course: Pt was admitted for total laparoscopic hysterectomy with BSO for previously diagnosed endometriosis.  Please see the separate operative note.  Pt did well overnight and was able to transition off IV morphine to oral narcotic pain medication.  She noted flatus and voided several times successfully.  Pain is currently well controlled and she has not experienced any vasomotor symptoms at this time.  Consults: None  Significant Diagnostic Studies: labs: CBC  Treatments: anticoagulation: LMW heparin and surgery: TLH with BSO  Discharge Exam: Blood pressure 108/78, pulse 63, temperature 98.4 F (36.9 C), temperature source Oral, resp. rate 18, height 5' 2.6" (1.59 m), weight 44 kg, SpO2 100 %. General appearance: alert, cooperative, and no distress Head: Normocephalic, without obvious abnormality, atraumatic Resp: clear to auscultation bilaterally Cardio: regular rate and rhythm GI: soft, ND, appropriately tender, positive bowel sounds Skin: Skin color, texture, turgor normal. No rashes or lesions Incision/Wound:  clean dry and intact, dermabond is in place  Disposition: Discharge disposition: 01-Home or Self Care       Discharge Instructions     Call MD for:  persistant dizziness or light-headedness   Complete by: As directed    Call MD for:  persistant nausea and vomiting   Complete by: As directed    Call MD for:  redness, tenderness, or signs of infection (pain, swelling, redness, odor or green/yellow discharge around incision site)   Complete by: As directed    Call MD for:  severe uncontrolled pain   Complete  by: As directed    Call MD for:  temperature >100.4   Complete by: As directed    Diet - low sodium heart healthy   Complete by: As directed    Discharge wound care:   Complete by: As directed    Ok to shower.  No soak baths for one month   Driving Restrictions   Complete by: As directed    No driving  for 2-3 days or while taking narcotic pain medication   Increase activity slowly   Complete by: As directed    Lifting restrictions   Complete by: As directed    No heavy lifting for 2-3 weeks   No dressing needed   Complete by: As directed    Sexual Activity Restrictions   Complete by: As directed    Pelvic rest 3 months      Allergies as of 07/13/2021       Reactions   Amphetamine-dextroamphetamine Other (See Comments)   Triggered bipolar, caused depression, and suicidal thoughts   Lupron [leuprolide] Anaphylaxis   Orilissa [elagolix] Anaphylaxis   Penicillins Anaphylaxis, Swelling, Other (See Comments)   Potential for anaphylaxis confirmed No "-cillins"!!!! Did it involve swelling of the face/tongue/throat, SOB, or low BP? Yes Did it involve sudden or severe rash/hives, skin peeling, or any reaction on the inside of your mouth or nose? Unknown Did you need to seek medical attention at a hospital or doctor's office? Unknown When did it last happen? teenager      If all above answers are "NO", may proceed with cephalosporin use.   Doxycycline Itching, Nausea And Vomiting, Other (See Comments)   Caused high fever, also   Olanzapine Other (See  Comments)   Caused mania   Other Other (See Comments)   Stimulants, mood stabilizers, and antidepressants = Triggered bipolar, caused depression, and suicidal thoughts   Prozac [fluoxetine Hcl] Other (See Comments)   Triggered bipolar, caused depression, and suicidal thoughts   Tape Other (See Comments)   Paper tape is tolerated   Triple Antibiotic Pain Relief [neomy-bacit-polymyx-pramoxine] Hives   Reglan [metoclopramide]  Anxiety, Other (See Comments)   Triggered bipolar, caused depression, suicidal thoughts, muscle twitching, stiffness, and high anxiety         Medication List     STOP taking these medications    acetaminophen 500 MG tablet Commonly known as: TYLENOL   triamcinolone cream 0.1 % Commonly known as: KENALOG       TAKE these medications    ADULT ONE DAILY GUMMIES PO Take 2 tablets by mouth daily.   albuterol 108 (90 Base) MCG/ACT inhaler Commonly known as: VENTOLIN HFA Inhale 1-2 puffs into the lungs every 6 (six) hours as needed for wheezing or shortness of breath.   Calcium-Magnesium-Vitamin D 600-40-500 MG-MG-UNIT Tb24 Take 3 tablets by mouth in the morning and at bedtime.   EPINEPHrine 0.3 mg/0.3 mL Soaj injection Commonly known as: EPI-PEN Inject 0.3 mg into the muscle as needed for anaphylaxis.   fexofenadine 180 MG tablet Commonly known as: ALLEGRA Take 180 mg by mouth daily.   Fish Oil 1200 MG Caps Take 1,200 mg by mouth daily.   ibuprofen 600 MG tablet Commonly known as: ADVIL Take 1 tablet (600 mg total) by mouth every 6 (six) hours as needed for headache, mild pain, moderate pain or cramping. What changed: Another medication with the same name was removed. Continue taking this medication, and follow the directions you see here.   linaclotide 290 MCG Caps capsule Commonly known as: Linzess Take 1 capsule (290 mcg total) by mouth daily before breakfast. What changed: when to take this   Melatonin 10 MG Tabs Take 10 mg by mouth at bedtime.   MiraLax 17 GM/SCOOP powder Generic drug: polyethylene glycol powder Take 17 g by mouth See admin instructions. Mix 17 g of powder into water and drink once a day   ondansetron 4 MG tablet Commonly known as: ZOFRAN Take 1 tablet (4 mg total) by mouth every 6 (six) hours as needed for nausea.   oxyCODONE-acetaminophen 5-325 MG tablet Commonly known as: PERCOCET/ROXICET Take 1 tablet by mouth every 4 (four) hours  as needed for up to 5 days for severe pain.   promethazine 12.5 MG tablet Commonly known as: PHENERGAN Take 12.5 mg by mouth See admin instructions. Take 12.5 mg by mouth every 6-8 hours as needed for nausea   sennosides-docusate sodium 8.6-50 MG tablet Commonly known as: SENOKOT-S Take 1-2 tablets by mouth daily.   vitamin C 1000 MG tablet Take 1,000 mg by mouth daily.   Vitamin D3 50 MCG (2000 UT) Tabs Take 2,000 Units by mouth daily.               Discharge Care Instructions  (From admission, onward)           Start     Ordered   07/13/21 0000  Discharge wound care:       Comments: Ok to shower.  No soak baths for one month   07/13/21 0754   07/13/21 0000  No dressing needed        07/13/21 0754            Follow-up Information  [name], MD Follow up in 1 month(s).   Specialty: Obstetrics and Gynecology Why: post operative visit and wound check Contact information: [place] [address] [phone]                 Signed: [name] 07/13/2021, 8:29 AM

## 2021-07-13 NOTE — Plan of Care (Signed)

## 2021-07-19 ENCOUNTER — Other Ambulatory Visit: Payer: Self-pay | Admitting: Internal Medicine

## 2021-07-19 DIAGNOSIS — K581 Irritable bowel syndrome with constipation: Secondary | ICD-10-CM

## 2021-07-25 ENCOUNTER — Ambulatory Visit (INDEPENDENT_AMBULATORY_CARE_PROVIDER_SITE_OTHER): Payer: Medicaid Other | Admitting: Clinical

## 2021-07-25 DIAGNOSIS — F909 Attention-deficit hyperactivity disorder, unspecified type: Secondary | ICD-10-CM

## 2021-07-25 DIAGNOSIS — F4310 Post-traumatic stress disorder, unspecified: Secondary | ICD-10-CM

## 2021-07-26 NOTE — BH Specialist Note (Deleted)
Integrated Behavioral Health via Telemedicine Visit  07/26/2021 Tochi Mishler [id number]  Number of [place] visits: *** Session Start time: 10:45***  Session End time: 11:15*** Total time: {IBH Total Time:21014050}  Referring Provider: *** Patient/Family location: Home*** [facility] Provider location: Center for [place] at [facility]  All persons participating in visit: Patient *** and [place] ***  Types of Service: {CHL AMB TYPE OF SERVICE:[phone]}  I connected with [name] and/or Alayssa Hartwig's {family members:20773} via  Telephone or Geologist, engineering  (Video is Tree surgeon) and verified that I am speaking with the correct person using two identifiers. Discussed confidentiality: {YES/NO:21197}  I discussed the limitations of telemedicine and the availability of in person appointments.  Discussed there is a possibility of technology failure and discussed alternative modes of communication if that failure occurs.  I discussed that engaging in this telemedicine visit, they consent to the provision of behavioral healthcare and the services will be billed under their insurance.  Patient and/or legal guardian expressed understanding and consented to Telemedicine visit: {YES/NO:21197}  Presenting Concerns: Patient and/or family reports the following symptoms/concerns: *** Duration of problem: ***; Severity of problem: {Mild/Moderate/Severe:20260}  Patient and/or Family's Strengths/Protective Factors: {CHL AMB BH PROTECTIVE FACTORS:[phone]}  Goals Addressed: Patient will:  Reduce symptoms of: {IBH Symptoms:21014056}   Increase knowledge and/or ability of: {IBH Patient Tools:21014057}   Demonstrate ability to: {IBH Goals:21014053}  Progress towards Goals: {CHL AMB BH PROGRESS TOWARDS GOALS:[id number]}  Interventions: Interventions utilized:  {IBH  Interventions:21014054} Standardized Assessments completed: {IBH Screening Tools:21014051}  Patient and/or Family Response: ***  Assessment: Patient currently experiencing ***.   Patient may benefit from ***.  Plan: Follow up with behavioral health clinician on : *** Behavioral recommendations: *** Referral(s): {IBH Referrals:21014055}  I discussed the assessment and treatment plan with the patient and/or parent/guardian. They were provided an opportunity to ask questions and all were answered. They agreed with the plan and demonstrated an understanding of the instructions.   They were advised to call back or seek an in-person evaluation if the symptoms worsen or if the condition fails to improve as anticipated.  Caroleen Hamman Judd Mccubbin, LCSW

## 2021-08-08 ENCOUNTER — Ambulatory Visit (INDEPENDENT_AMBULATORY_CARE_PROVIDER_SITE_OTHER): Payer: Medicaid Other | Admitting: Clinical

## 2021-08-08 DIAGNOSIS — F909 Attention-deficit hyperactivity disorder, unspecified type: Secondary | ICD-10-CM | POA: Diagnosis not present

## 2021-08-08 DIAGNOSIS — F4310 Post-traumatic stress disorder, unspecified: Secondary | ICD-10-CM

## 2021-08-08 NOTE — BH Specialist Note (Addendum)
Integrated Behavioral Health via Telemedicine Visit  08/08/2021 Rachel [name] [id number]  Number of [place] visits: 5 Session Start time: 10:45  Session End time: 11:52 Total time:  2  Referring Provider: Lynnda Shields, MD Rachel/Family location: Home [facility] Provider location: [facility] for [facility]  All persons participating in visit: Rachel [name] Rachel [place]   Types of Service: Individual psychotherapy Rachel Telephone visit  I connected with Higinio [name] Rachel/or [place]  via  Telephone or Video Enabled Telemedicine Application  (Video is Tree surgeon) Rachel verified that I am speaking with the correct person using two identifiers. Discussed confidentiality: Yes   I discussed the limitations of telemedicine Rachel the availability of in person appointments.  Discussed there is a possibility of technology failure Rachel discussed alternative modes of communication if that failure occurs.  I discussed that engaging in this telemedicine visit, they consent to the provision of behavioral healthcare Rachel the services will be billed under their insurance.  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  Duration of problem: Ongoing; Severity of problem: moderate  Rachel Rachel/or Family's Strengths/Protective Factors: Social connections, Social Rachel Emotional competence, Concrete supports in place (healthy food, safe environments, etc.),  Sense of purpose, Rachel Physical Health (exercise, healthy diet, medication compliance, etc.)  Goals Addressed: Rachel will:  Reduce symptoms of: anxiety, mood instability, Rachel stress   Demonstrate ability to: Increase healthy adjustment to current life circumstances  Progress towards Goals: Ongoing  Interventions: Interventions utilized:  ACT (Acceptance Rachel Commitment Therapy) Standardized Assessments completed: Not Needed  Rachel Rachel/or Family Response: Pt agrees with ongoing treatment [name]  Assessment: Rachel currently experiencing ADHD Rachel PTSD (both previously diagnosed).   Rachel may benefit from continued psychoeducation Rachel brief therapeutic interventions regarding coping with symptoms of anxiety, stress, mood instability .  [name]: Follow up with behavioral health clinician on : Two weeks Behavioral recommendations:  -Continue using self-coping strategies to manage changing emotions, attributed to hormonal changes post-hysterectomy -Continue weekend plans with childhood friend -Consider planning for extra self-care practices on "trigger" days (example: bad weather), to increase overall physical Rachel emotional wellness Referral(s): [facility] (In Clinic)  I discussed the assessment Rachel treatment [name] with the Rachel Rachel/or parent/guardian. They were provided an opportunity to ask questions Rachel all were answered. They agreed with the [name] Rachel demonstrated an understanding of the instructions.   They were advised to call back or seek an in-person evaluation if the symptoms worsen or if the condition fails to improve as anticipated.  [name], LCSW  Depression screen [facility] 2/9 06/20/2021 06/17/2021 05/02/2021 02/21/2021 02/16/2021  Decreased Interest 0 0 0 0 0  Down, Depressed, Hopeless 0 0 0 0 0  PHQ - 2 Score 0 0 0 0 0  Altered sleeping 3 - 2 1 2   Tired, decreased energy 2 - 2 1 2   Change in appetite 3 - 2 2 3   Feeling bad or failure about  yourself  0 - 0 0 0  Trouble concentrating 1 - 0 0 2  Moving slowly or fidgety/restless 0 - 0  0 0  Suicidal thoughts 0 - 0 0 0  PHQ-9 Score 9 - 6 4 9   Some recent data might be hidden   GAD 7 : Generalized Anxiety Score 06/20/2021 06/17/2021 05/02/2021 02/21/2021  Nervous, Anxious, on Edge 1 1 0 0  Control/stop worrying 0 1 0 0  Worry too much - different things 0 0 0 0  Trouble relaxing 1 0 0 0  Restless 0 0 0 0  Easily annoyed or irritable 0 0 0 0  Afraid - awful might happen 0 0 0 0  Total GAD 7 Score 2 2 0 0

## 2021-08-09 NOTE — BH Specialist Note (Addendum)
Integrated Behavioral Health via Telemedicine Visit  08/09/2021 Shaaron Golliday [id number]  Number of [place] visits: 6 Session Start time: 10:45  Session End time: 11:55 Total time:  19  Referring Provider: Lynnda Shields, MD Patient/Family location: Home [facility] Provider location: Center for [facility] at [facility]  All persons participating in visit: [name] and [place]   Types of Service: Individual psychotherapy and Video visit  I connected with [name] and/or [place]  via  Telephone or Video Enabled Telemedicine Application  (Video is Tree surgeon) and verified that I am speaking with the correct person using two identifiers. Discussed confidentiality: Yes   I discussed the limitations of telemedicine and the availability of in person appointments.  Discussed there is a possibility of technology failure and discussed alternative modes of communication if that failure occurs.  I discussed that engaging in this telemedicine visit, they consent to the provision of behavioral healthcare and the services will be billed under their insurance.  Patient and/or legal guardian expressed understanding and consented to Telemedicine visit: Yes   Presenting Concerns: Patient and/or family reports the following symptoms/concerns: Primary concerns today are ongoing digestive issues, noticing trust issues with specific groups/people, and worry over side effects of HRT that will begin in a couple months.  Duration of problem: Ongoing; Severity of problem: moderate  Patient and/or Family's Strengths/Protective Factors: Social connections, Social and Emotional competence, Concrete supports in place (healthy food, safe environments, etc.), Sense of purpose, and Physical Health (exercise, healthy diet, medication compliance, etc.)  Goals Addressed: Patient will:  Reduce  symptoms of: anxiety and stress   Demonstrate ability to: Increase healthy adjustment to current life circumstances  Progress towards Goals: Ongoing  Interventions: Interventions utilized:  ACT (Acceptance and Commitment Therapy) Standardized Assessments completed: Not Needed  Patient and/or Family Response: Pt using many self-coping strategies effectively; requests continued visits due to possible mood instability with upcoming HRT  Assessment: Patient currently experiencing ADHD, PTSD (both as previously diagnosed).   Patient may benefit from continued  brief therapeutic interventions regarding coping with symptoms of anxiety, depression, stress .  Plan: Follow up with behavioral health clinician on : Two weeks Behavioral recommendations:  -Continue using self-coping strategies that have been effective in managing emotions (affirmations, visual board, relaxation breathing, grounding strategy, positive outlook, etc.) -Compile a list of individual people you trust; compile a list of groups you do not trust. Do you have a good reason to distrust or be cautious towards those groups? (We will discuss this at next visit).  Referral(s): [place] (In Clinic)  I discussed the assessment and treatment plan with the patient and/or parent/guardian. They were provided an opportunity to ask questions and all were answered. They agreed with the plan and demonstrated an understanding of the instructions.   They were advised to call back or seek an in-person evaluation if the symptoms worsen or if the condition fails to improve as anticipated.  [name], LCSW  Depression screen [facility] 2/9 06/20/2021 06/17/2021 05/02/2021 02/21/2021 02/16/2021  Decreased Interest 0 0 0 0 0  Down, Depressed, Hopeless 0 0 0 0 0  PHQ - 2 Score 0 0 0 0 0  Altered sleeping 3 - 2 1 2   Tired, decreased energy 2 - 2 1 2   Change in appetite 3 - 2 2 3   Feeling bad or failure about yourself  0 -  0 0 0  Trouble concentrating 1 -  0 0 2  Moving slowly or fidgety/restless 0 - 0 0 0  Suicidal thoughts 0 - 0 0 0  PHQ-9 Score 9 - 6 4 9   Some recent data might be hidden   GAD 7 : Generalized Anxiety Score 06/20/2021 06/17/2021 05/02/2021 02/21/2021  Nervous, Anxious, on Edge 1 1 0 0  Control/stop worrying 0 1 0 0  Worry too much - different things 0 0 0 0  Trouble relaxing 1 0 0 0  Restless 0 0 0 0  Easily annoyed or irritable 0 0 0 0  Afraid - awful might happen 0 0 0 0  Total GAD 7 Score 2 2 0 0

## 2021-08-17 ENCOUNTER — Ambulatory Visit (INDEPENDENT_AMBULATORY_CARE_PROVIDER_SITE_OTHER): Payer: Medicaid Other | Admitting: Obstetrics and Gynecology

## 2021-08-17 ENCOUNTER — Encounter: Payer: Self-pay | Admitting: Obstetrics and Gynecology

## 2021-08-17 ENCOUNTER — Other Ambulatory Visit: Payer: Self-pay

## 2021-08-17 VITALS — BP 115/89 | HR 78 | Wt 98.4 lb

## 2021-08-17 DIAGNOSIS — Z4889 Encounter for other specified surgical aftercare: Secondary | ICD-10-CM

## 2021-08-17 DIAGNOSIS — N951 Menopausal and female climacteric states: Secondary | ICD-10-CM

## 2021-08-17 MED ORDER — ESTROGENS CONJUGATED 0.45 MG PO TABS: 0.4500 mg | ORAL_TABLET | Freq: Every day | ORAL | 6 refills | Status: DC

## 2021-08-17 NOTE — Progress Notes (Signed)
    Subjective:    Rachel [name] is a 35 y.o. female who presents to the clinic status post total laparoscopic hysterectomy with bilateral salpingo-oophorectomy on 07/12/21.  Previous   pelvic pain has resolved.   Eating a regular diet without difficulty. Bowel movements are normal. No other significant postoperative concerns.  Pt did not a questionable reaction the chloraprep solution with a rash which has occurred in her last two procedures.  The following portions of the patient's history were reviewed and updated as appropriate: allergies, current medications, past family history, past medical history, past social history, past surgical history, and problem list..  Last pap smear was normal on 03/09/2020. Review of Systems Pertinent items are noted in HPI.   Objective:   BP 115/89   Pulse 78   Wt 98 lb 6.4 oz (44.6 kg)   BMI 17.65 kg/m  Constitutional:  Well-developed, well-nourished female in no acute distress.   Skin: Skin is warm and dry, no rash noted, not diaphoretic,no erythema, no pallor.  Cardiovascular: Normal heart rate noted  Respiratory: Effort and breath sounds normal, no problems with respiration noted  Abdomen: Soft, bowel sounds active, non-tender, no abnormal masses  Incision: Healing well, no drainage, no erythema, no hernia, no seroma, no swelling, no dehiscence, incision well approximated, small bulge to left of umbilicus not consistent with hernia or abdominal wall defect.  Pelvic:   Normal appearing external genitalia; normal appearing vaginal mucosa and well-healing vaginal cuff with sutures visible.  No abnormal discharge noted.   Surgical pathology  Normal uterus tubes and ovaries Assessment:   Doing well postoperatively.  Operative findings again reviewed. Pathology report discussed.   Plan:   1. Continue any current medications. 2. Wound care discussed. 3. Activity restrictions:  continue pelvic rest until early December 4. Anticipated return to  work: now. 5. Follow up as needed, annual exam in 1 year 6.  Routine preventative health maintenance measures emphasized. 7. Will start HRT with oral premarin, pt request, at 0.45 mg daily at the beginning of December.  Agree with pt to pause start of HRT to allow any remnants of endometriosis to recede. Please refer to After Visit Summary for other counseling recommendations.    Lynnda Shields, MD, [place] Attending [place] for [facility], [place]

## 2021-08-22 ENCOUNTER — Ambulatory Visit (INDEPENDENT_AMBULATORY_CARE_PROVIDER_SITE_OTHER): Payer: Medicaid Other | Admitting: Clinical

## 2021-08-22 DIAGNOSIS — F4310 Post-traumatic stress disorder, unspecified: Secondary | ICD-10-CM

## 2021-08-22 DIAGNOSIS — F909 Attention-deficit hyperactivity disorder, unspecified type: Secondary | ICD-10-CM

## 2021-08-24 NOTE — BH Specialist Note (Addendum)
Integrated Behavioral Health via Telemedicine Visit  08/24/2021 Rachel [name] [id number]  Number of Rachel [name] visits: 7 Session Start time: 10:46  Session End time: 12:00 Total time:  76  Referring Provider: Lynnda Shields, MD Patient/Family location: Home Rachel [name] Provider location: [name]  All persons participating in visit: [name] and Rachel [name]   Types of Service: Individual psychotherapy and Telephone visit  I connected with Rachel [name] [name]  via  Telephone or Video Enabled Telemedicine Application  (Video is Tree surgeon) and verified that I am speaking with the correct person using two identifiers. Discussed confidentiality: Yes   I discussed the limitations of telemedicine and the availability of in person appointments.  Discussed there is a possibility of technology failure and discussed alternative modes of communication if that failure occurs.  I discussed that engaging in this telemedicine visit, they consent to the provision of behavioral healthcare and the services will be billed under their insurance.  Patient and/or legal guardian expressed understanding and consented to Telemedicine visit: Yes   Presenting Concerns: Patient and/or family reports the following symptoms/concerns: Relationship uncertainty/ broken trust with partner after he may/may not want biological children in the future, but did not speak up until after pt's hysterectomy.  Duration of problem: Past week; Severity of problem:  undetermined  Patient and/or Family's Strengths/Protective Factors: Social connections, Social and Emotional competence, Concrete supports in place (healthy food, safe environments, etc.), Sense of purpose, and Physical Health (exercise, healthy diet, medication compliance, etc.)  Goals Addressed: Patient will:  Reduce  symptoms of: anxiety, depression, and stress   Demonstrate ability to: Increase healthy adjustment to current life circumstances  Progress towards Goals: Ongoing  Interventions: Interventions utilized:  ACT (Acceptance and Commitment Therapy) Standardized Assessments completed: Not Needed  Patient and/or Family Response: Pt agrees with treatment [name]  Assessment: Patient currently experiencing ADHD, PTSD (as previously diagnosed) and Grief related to relationship  Patient may benefit from continued .psychosocial .  [name]: Follow up with behavioral health clinician on : One week Behavioral recommendations:  -Continue using self-coping strategies that have remained helpful to manage emotions -Write down your emotional response to possible paths forward, as discussed. Also, write down positives from past four year relationship, including what you've learned along the way.  Referral(s): [place] (In Clinic)  I discussed the assessment and treatment [name] with the patient and/or parent/guardian. They were provided an opportunity to ask questions and all were answered. They agreed with the [name] and demonstrated an understanding of the instructions.   They were advised to call back or seek an in-person evaluation if the symptoms worsen or if the condition fails to improve as anticipated.  Rachel Hamman Kalisa Girtman, LCSW

## 2021-09-05 ENCOUNTER — Ambulatory Visit (INDEPENDENT_AMBULATORY_CARE_PROVIDER_SITE_OTHER): Payer: Medicaid Other | Admitting: Clinical

## 2021-09-05 ENCOUNTER — Other Ambulatory Visit: Payer: Self-pay

## 2021-09-05 DIAGNOSIS — F4321 Adjustment disorder with depressed mood: Secondary | ICD-10-CM

## 2021-09-05 DIAGNOSIS — F4310 Post-traumatic stress disorder, unspecified: Secondary | ICD-10-CM

## 2021-09-05 DIAGNOSIS — F909 Attention-deficit hyperactivity disorder, unspecified type: Secondary | ICD-10-CM

## 2021-09-05 NOTE — Patient Instructions (Signed)
Center for [facility] at [facility] [place], [place] [address] [phone] (main office) [phone] ([place] office)  Stages of Grief  Denial (including shock and numbness) Anger (including emotional outbursts and fear) Bargaining (including searching for answers, pleading for a different outcome, disorganization, panic) Depression (including guilt, loneliness, isolating, difficulty re-entering "normal" life) Acceptance (including hope, affirmations, finding new strength, new patterns, building new relationships, and reaching out to help others)  These stages do not necessarily come in this order, and there is no time frame for grief. It takes as long as it takes. For some, it may seem easier to avoid feeling emotional pain, but in order to heal, it helps to allow time and space to feel the emotions, as they are, and to allow the grieving process to take place.

## 2021-09-08 NOTE — BH Specialist Note (Addendum)
Integrated Behavioral Health via Telemedicine Visit  09/08/2021 Zorina Mallin [id number]  Number of [place] visits: 8 Session Start time: 10:45  Session End time: 11:45 Total time: 60  Referring Provider: Lynnda Shields, MD Patient/Family location: Home [facility] Provider location: [facility] for [facility] at [facility]  All persons participating in visit: [name] and [place]   Types of Service: Individual psychotherapy and Video visit  I connected with [name] and/or [place]  via  Telephone or Video Enabled Telemedicine Application  (Video is Tree surgeon) and verified that I am speaking with the correct person using two identifiers. Discussed confidentiality: Yes   I discussed the limitations of telemedicine and the availability of in person appointments.  Discussed there is a possibility of technology failure and discussed alternative modes of communication if that failure occurs.  I discussed that engaging in this telemedicine visit, they consent to the provision of behavioral healthcare and the services will be billed under their insurance.  Patient and/or legal guardian expressed understanding and consented to Telemedicine visit: Yes   Presenting Concerns: Patient and/or family reports the following symptoms/concerns: Breakup with fiance, after 4 year relationship. Duration of problem: Two weeks; Severity of problem:  moderately sever  Patient and/or Family's Strengths/Protective Factors: Social connections, Social and Emotional competence, Concrete supports in place (healthy food, safe environments, etc.), Sense of purpose, and Physical Health (exercise, healthy diet, medication compliance, etc.)  Goals Addressed: Patient will:  Reduce symptoms of: anxiety, depression, stress, and grieving over loss of relationship    Demonstrate ability to: Begin healthy  grieving over loss of relationship  Progress towards Goals: Ongoing  Interventions: Interventions utilized:  Supportive Reflection Standardized Assessments completed: Not Needed  Patient and/or Family Response: Pt agrees with ongoing treatment plan  Assessment: Patient currently experiencing ADHD, PTSD, Bipolar 1 (all as previously diagnosed)  Patient may benefit from continued psychoeducation and brief therapeutic interventions regarding coping with recent relationship breakup .  Plan: Follow up with behavioral health clinician on : Two weeks Behavioral recommendations:  -Continue using self-coping strategies that have remained helpful to manage emotions  -Remain committed to setting healthy boundary after break-up -Consider making plan to spend time with friends in the next two weeks, along with exploring remote work options Referral(s): [place] (In Clinic)  I discussed the assessment and treatment plan with the patient and/or parent/guardian. They were provided an opportunity to ask questions and all were answered. They agreed with the plan and demonstrated an understanding of the instructions.   They were advised to call back or seek an in-person evaluation if the symptoms worsen or if the condition fails to improve as anticipated.  Caroleen Hamman Lam Mccubbins, LCSW

## 2021-09-13 ENCOUNTER — Ambulatory Visit (INDEPENDENT_AMBULATORY_CARE_PROVIDER_SITE_OTHER): Payer: Medicaid Other | Admitting: Clinical

## 2021-09-13 DIAGNOSIS — F319 Bipolar disorder, unspecified: Secondary | ICD-10-CM

## 2021-09-13 DIAGNOSIS — F4310 Post-traumatic stress disorder, unspecified: Secondary | ICD-10-CM

## 2021-09-13 DIAGNOSIS — F4321 Adjustment disorder with depressed mood: Secondary | ICD-10-CM | POA: Diagnosis not present

## 2021-09-13 DIAGNOSIS — F909 Attention-deficit hyperactivity disorder, unspecified type: Secondary | ICD-10-CM

## 2021-09-14 ENCOUNTER — Telehealth: Payer: Self-pay | Admitting: Gastroenterology

## 2021-09-14 NOTE — Telephone Encounter (Signed)
Hey Dr. Silverio Decamp,  We received a referral in our office for gastroparesis and IBS. Patient was a previous patient of yours back in 2021. Have recently seen a GI at [facility] in 2022. She would like to continue care with you. Records are in Epic. Could you please review and advise on scheduling?  Thank you

## 2021-09-15 ENCOUNTER — Other Ambulatory Visit (HOSPITAL_BASED_OUTPATIENT_CLINIC_OR_DEPARTMENT_OTHER): Payer: Self-pay

## 2021-09-15 ENCOUNTER — Ambulatory Visit: Payer: Medicaid Other | Attending: Internal Medicine

## 2021-09-15 DIAGNOSIS — Z23 Encounter for immunization: Secondary | ICD-10-CM

## 2021-09-15 MED ORDER — PFIZER COVID-19 VAC BIVALENT 30 MCG/0.3ML IM SUSP
INTRAMUSCULAR | 0 refills | Status: DC
  Filled 2021-09-15: qty 0.3, 1d supply, fill #0

## 2021-09-15 NOTE — Progress Notes (Signed)
   Covid-19 Vaccination Clinic  Name:  Rachel [name]    MRN: [id number] DOB: [birth year]  09/15/2021  [name] was observed post Covid-19 immunization for 15 minutes without incident. She was provided with Vaccine Information Sheet and instruction to access the V-Safe system.   [name] was instructed to call 911 with any severe reactions post vaccine: Difficulty breathing  Swelling of face and throat  A fast heartbeat  A bad rash all over body  Dizziness and weakness   Immunizations Administered     Name Date Dose VIS Date Route   Pfizer Covid-19 Vaccine Bivalent Booster 09/15/2021 11:19 AM 0.3 mL 07/13/2021 Intramuscular   Manufacturer: [place]   Lot: [id number]   [place]: [phone]

## 2021-09-17 ENCOUNTER — Other Ambulatory Visit: Payer: Self-pay | Admitting: Internal Medicine

## 2021-09-17 DIAGNOSIS — K581 Irritable bowel syndrome with constipation: Secondary | ICD-10-CM

## 2021-09-17 NOTE — Telephone Encounter (Signed)
Requested Prescriptions  Pending Prescriptions Disp Refills  . LINZESS 290 MCG CAPS capsule [Pharmacy Med Name: LINZESS 290 MCG CAPSULE] 30 capsule 0    Sig: TAKE ONE CAPSULE BY MOUTH DAILY BEFORE BREAKFAST     Gastroenterology: Irritable Bowel Syndrome Passed - 09/17/2021 11:34 AM      Passed - Valid encounter within last 12 months    Recent Outpatient Visits          3 months ago Anaphylaxis, sequela   [place] Dorna Mai, MD   11 months ago [place], Enobong, MD   1 year ago [place], Enobong, MD   1 year ago Dermatitis   [place], Enobong, MD   1 year ago Generalized abdominal pain   [facility] Charlott Rakes, MD

## 2021-09-19 NOTE — BH Specialist Note (Addendum)
Integrated Behavioral Health via Telemedicine Visit  09/19/2021 Rachel [name] [id number]  Number of [place] visits: 9 Session Start time: 10:45  Session End time: 11:39 Total time:  61  Referring Provider: Lynnda Shields, MD Patient/Family location: Home [facility] Provider location: Center for [facility] at [place] [facility]  All persons participating in visit: [name] and [name]  Types of Service: Individual psychotherapy and Telephone visit  I connected with Rachel [name] [name]  via  Telephone or Video Enabled Telemedicine Application  (Video is Tree surgeon) and verified that I am speaking with the correct person using two identifiers. Discussed confidentiality: Yes   I discussed the limitations of telemedicine and the availability of in person appointments.  Discussed there is a possibility of technology failure and discussed alternative modes of communication if that failure occurs.  I discussed that engaging in this telemedicine visit, they consent to the provision of behavioral healthcare and the services will be billed under their insurance.  Patient and/or legal guardian expressed understanding and consented to Telemedicine visit: Yes   Presenting Concerns: Patient and/or family reports the following symptoms/concerns: Conflict as target of bullying; exploring feelings regarding relationship with significant other, and moving towards personal life goals; legal questions Duration of problem: Ongoing;; Severity of problem: moderate  Patient and/or Family's Strengths/Protective Factors: Social connections, Social and Emotional competence, Concrete supports in place (healthy food, safe environments, etc.), Sense of purpose, and Physical Health (exercise, healthy diet, medication compliance, etc.)  Goals Addressed: Patient will:  Reduce symptoms of: anxiety,  depression, and stress   Progress towards Goals: Ongoing  Interventions: Interventions utilized:  ACT (Acceptance and Commitment Therapy) Standardized Assessments completed: Not Needed  Patient and/or Family Response: Pt agrees with treatment [name]  Assessment: Patient currently experiencing ADHD, PTSD, Bipolar 1, all as previously diagnosed.   Patient may benefit from continued therapeutic interventions.  [name]: Follow up with behavioral health clinician on : Three weeks Behavioral recommendations:  -Continue using healthy self-coping strategies to manage emotions, including setting healthy boundaries in relationships -Consider legal resources (on After Visit Summary), as needed Referral(s): [place] (In Clinic) and [employer]:  legal  I discussed the assessment and treatment [name] with the patient and/or parent/guardian. They were provided an opportunity to ask questions and all were answered. They agree d with the [name] and demonstrated an understanding of the instructions.   They were advised to call back or seek an in-person evaluation if the symptoms worsen or if the condition fails to improve as anticipated.  Caroleen Hamman [name], LCSW

## 2021-09-20 NOTE — Telephone Encounter (Signed)
Ok to schedule next available appointment with me or app.  Thank you

## 2021-09-21 ENCOUNTER — Encounter: Payer: Self-pay | Admitting: Gastroenterology

## 2021-09-21 NOTE — Telephone Encounter (Signed)
Scheduled OV 12/13

## 2021-09-27 ENCOUNTER — Ambulatory Visit (INDEPENDENT_AMBULATORY_CARE_PROVIDER_SITE_OTHER): Payer: Medicaid Other | Admitting: Clinical

## 2021-09-27 ENCOUNTER — Other Ambulatory Visit: Payer: Self-pay

## 2021-09-27 DIAGNOSIS — Z658 Other specified problems related to psychosocial circumstances: Secondary | ICD-10-CM

## 2021-09-27 DIAGNOSIS — F909 Attention-deficit hyperactivity disorder, unspecified type: Secondary | ICD-10-CM

## 2021-09-27 DIAGNOSIS — F319 Bipolar disorder, unspecified: Secondary | ICD-10-CM

## 2021-09-27 DIAGNOSIS — F4310 Post-traumatic stress disorder, unspecified: Secondary | ICD-10-CM

## 2021-09-27 NOTE — Patient Instructions (Signed)
Women's Resource [email or web address]  [employer] [place] [email or web address]

## 2021-10-05 NOTE — BH Specialist Note (Addendum)
Integrated Behavioral Health via Telemedicine Visit  10/05/2021 Shelli Portilla [id number]  Number of [place] visits: 10 Session Start time: 10:45  Session End time: 12:00 Total time:  1  Referring Provider: Lynnda Shields, MD Patient/Family location: Home [facility] Provider location: Center for [facility] at [facility] for Women  All persons participating in visit: [name] and [place]   Types of Service: Individual psychotherapy and Telephone visit  I connected with [name] and/or [place]  via  Telephone or Video Enabled Telemedicine Application  (Video is Tree surgeon) and verified that I am speaking with the correct person using two identifiers. Discussed confidentiality: Yes   I discussed the limitations of telemedicine and the availability of in person appointments.  Discussed there is a possibility of technology failure and discussed alternative modes of communication if that failure occurs.  I discussed that engaging in this telemedicine visit, they consent to the provision of behavioral healthcare and the services will be billed under their insurance.  Patient and/or legal guardian expressed understanding and consented to Telemedicine visit: Yes   Presenting Concerns: Patient and/or family reports the following symptoms/concerns: Processing feelings regarding past breakup, starting hormones, and uncertainty about the future. Duration of problem: Ongoing; Severity of problem: moderate  Patient and/or Family's Strengths/Protective Factors: Social connections, Social and Emotional competence, Concrete supports in place (healthy food, safe environments, etc.), Sense of purpose, and Physical Health (exercise, healthy diet, medication compliance, etc.)  Goals Addressed: Patient will:  Reduce symptoms of: anxiety, depression, and stress   Demonstrate ability to:  Increase healthy adjustment to current life circumstances  Progress towards Goals: Ongoing  Interventions: Interventions utilized:  ACT (Acceptance and Commitment Therapy) Standardized Assessments completed: GAD-7 and PHQ 9  Patient and/or Family Response: Pt agrees with ongoing treatment   Assessment: Patient currently experiencing ADHD, PTSD, Bipolar 1, all previously diagnosed.   Patient may benefit from continued psychoeducation and brief therapeutic interventions regarding coping with symptoms of anxiety, depression, stress  Plan: Follow up with behavioral health clinician on : Three weeks Behavioral recommendations:  -Continue using self-coping strategies and setting healthy boundaries daily -Continue plan to start (cautiously) dating again this week -Use [employer], as discussed (updating resume, vocational options, etc.) Referral(s): [place] (In Clinic)  I discussed the assessment and treatment plan with the patient and/or parent/guardian. They were provided an opportunity to ask questions and all were answered. They agreed with the plan and demonstrated an understanding of the instructions.   They were advised to call back or seek an in-person evaluation if the symptoms worsen or if the condition fails to improve as anticipated.  [name], LCSW  Depression screen [facility] 2/9 10/19/2021 06/20/2021 06/17/2021 05/02/2021 02/21/2021  Decreased Interest 1 0 0 0 0  Down, Depressed, Hopeless 1 0 0 0 0  PHQ - 2 Score 2 0 0 0 0  Altered sleeping 1 3 - 2 1  Tired, decreased energy 0 2 - 2 1  Change in appetite 0 3 - 2 2  Feeling bad or failure about yourself  1 0 - 0 0  Trouble concentrating 1 1 - 0 0  Moving slowly or fidgety/restless 0 0 - 0 0  Suicidal thoughts 0 0 - 0 0  PHQ-9 Score 5 9 - 6 4  Some recent data might be hidden   GAD 7 : Generalized Anxiety Score 10/19/2021 06/20/2021 06/17/2021 05/02/2021  Nervous, Anxious, on Edge 1  1  1 0  Control/stop worrying 0 0 1 0  Worry too much - different things 1 0 0 0  Trouble relaxing 1 1 0 0  Restless 0 0 0 0  Easily annoyed or irritable 1 0 0 0  Afraid - awful might happen 1 0 0 0  Total GAD 7 Score 5 2 2  0

## 2021-10-19 ENCOUNTER — Ambulatory Visit (INDEPENDENT_AMBULATORY_CARE_PROVIDER_SITE_OTHER): Payer: Medicaid Other | Admitting: Clinical

## 2021-10-19 DIAGNOSIS — F4310 Post-traumatic stress disorder, unspecified: Secondary | ICD-10-CM | POA: Diagnosis not present

## 2021-10-19 DIAGNOSIS — F319 Bipolar disorder, unspecified: Secondary | ICD-10-CM

## 2021-10-19 DIAGNOSIS — F909 Attention-deficit hyperactivity disorder, unspecified type: Secondary | ICD-10-CM

## 2021-10-19 NOTE — Patient Instructions (Signed)
Center for [facility] Healthcare at [facility] for Women [place], [place] [address] [phone] (main office) [phone] [facility] office)  [email or web address]

## 2021-10-25 ENCOUNTER — Ambulatory Visit: Payer: Medicaid Other | Admitting: Gastroenterology

## 2021-10-25 ENCOUNTER — Encounter: Payer: Self-pay | Admitting: Gastroenterology

## 2021-10-25 VITALS — BP 84/64 | HR 76 | Ht 62.5 in | Wt 100.0 lb

## 2021-10-25 DIAGNOSIS — R109 Unspecified abdominal pain: Secondary | ICD-10-CM | POA: Diagnosis not present

## 2021-10-25 DIAGNOSIS — R278 Other lack of coordination: Secondary | ICD-10-CM

## 2021-10-25 DIAGNOSIS — K5904 Chronic idiopathic constipation: Secondary | ICD-10-CM | POA: Diagnosis not present

## 2021-10-25 DIAGNOSIS — K5902 Outlet dysfunction constipation: Secondary | ICD-10-CM | POA: Diagnosis not present

## 2021-10-25 MED ORDER — TRULANCE 3 MG PO TABS: 1.0000 | ORAL_TABLET | Freq: Every day | ORAL | 3 refills | Status: DC

## 2021-10-25 NOTE — Patient Instructions (Addendum)
We will send Trulance to your pharmacy, it may not be covered by your insurance.   Continue Miralax as needed  Eat small frequent meals  We have referred you to Pelvic Floor Therapy, and they will contact you with an appointment    If you are age 34 or older, your body mass index should be between 23-30. Your Body mass index is 18 kg/m. If this is out of the aforementioned range listed, please consider follow up with your Primary Care Provider.  If you are age 53 or younger, your body mass index should be between 19-25. Your Body mass index is 18 kg/m. If this is out of the aformentioned range listed, please consider follow up with your Primary Care Provider.   ________________________________________________________  The [facility] providers would like to encourage you to use [facility] to communicate with providers for non-urgent requests or questions.  Due to long hold times on the telephone, sending your provider a message by [facility] may be a faster and more efficient way to get a response.  Please allow 48 business hours for a response.  Please remember that this is for non-urgent requests.  _______________________________________________________   I appreciate the  opportunity to care for you  Thank You   Harl Bowie , MD

## 2021-10-25 NOTE — Progress Notes (Signed)
Rachel [name]    [id number]    04/29/1986  Primary Care Physician:Newlin, Charlane Ferretti, MD  Referring Physician: Charlott Rakes, MD Center,  [place] [address]   Chief complaint: Abdominal pain, constipation, nausea  HPI:  35 year old very pleasant female here for follow-up visit accompanied by her mother for chronic abdominal pain, constipation, bloating and nausea. She was seen at [facility] hospital in the past year and has had work-up, patient wants to follow here instead  07/12/21 S/p Total laparoscopic hysterectomy, bilateral salpingo-oophorectomy, cystoscopy for pelvic pain. Negative for endometriosis on surgical resected specimen. 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   On average she has bowel movement once every 3 to 4 days, she is using Linzess, MiraLAX and senna  She continues to have abdominal bloating and discomfort limiting p.o. intake.  Her weight has been stable, denies any significant weight loss.  No vomiting but continues to have intermittent episodes of nausea.   GI series with small bowel follow-through April 28, 2020: No hiatal hernia, no significant gastroesophageal reflux.  Gastric emptying was subjectively delayed.  Delayed small bowel transit.  No fixed filling defects or obstruction.   EGD May 20,2021 concerning for large hiatal hernia and partial gastric volvulus.  LA grade a esophagitis otherwise unremarkable exam   Subsequent CT abdomen pelvis with contrast on Apr 08, 2020 was negative for hiatal hernia or any pathology   Colonoscopy Apr 01, 2020: Mild melanosis, internal hemorrhoids otherwise normal  exam   Right upper quadrant abdominal ultrasound March 01, 2020: Normal   Transvaginal pelvic ultrasound Mar 17, 2020: Normal   In 2009 2-hour gastric emptying study showed delayed emptying with 83% retention at 1 hour and 50% retained gastric contents at the end of 2 hours    She was treated for possible parasitic infection with albendazole in April 2021, saw Dr. Drucilla Schmidt in [place], follow-up labs were unremarkable with in normal range.   Celiac studies, ANCA , ESR, CRP, anti-Saccharomyces Cerave Cl ab,  HIV, viral hepatitis studies.  Albumin 4.3, Prealbumin 25 within normal range. Thyroid studies WNL.    Outpatient Encounter Medications as of 10/25/2021  Medication Sig   albuterol (VENTOLIN HFA) 108 (90 Base) MCG/ACT inhaler Inhale 1-2 puffs into the lungs every 6 (six) hours as needed for wheezing or shortness of breath.   Ascorbic Acid (VITAMIN C) 1000 MG tablet Take 1,000 mg by mouth daily.   Calcium-Magnesium-Vitamin D 600-40-500 MG-MG-UNIT TB24 Take 3 tablets by mouth in the morning and at bedtime.   Cholecalciferol (VITAMIN D3) 50 MCG (2000 UT) TABS Take 2,000 Units by mouth daily.   COVID-19 mRNA bivalent vaccine, Pfizer, (PFIZER COVID-19 VAC BIVALENT) injection Inject into the muscle.   EPINEPHrine 0.3 mg/0.3 mL IJ SOAJ injection Inject 0.3 mg into the muscle as needed for anaphylaxis.   estrogens, conjugated, (PREMARIN) 0.45 MG tablet Take 1 tablet (0.45 mg total) by mouth daily.   fexofenadine (ALLEGRA) 180 MG tablet Take 180 mg by mouth daily.   ibuprofen (ADVIL) 600 MG tablet Take 1 tablet (600 mg total) by mouth every 6 (six) hours as needed for headache, mild pain, moderate pain or cramping.  LINZESS 290 MCG CAPS capsule TAKE ONE CAPSULE BY MOUTH DAILY BEFORE BREAKFAST   Melatonin 10 MG TABS Take 10 mg by mouth at bedtime.   MIRALAX 17 GM/SCOOP powder Take 17 g by mouth See admin instructions. Mix 17 g of powder into water and drink once a day   Multiple Vitamins-Minerals (ADULT  ONE DAILY GUMMIES PO) Take 2 tablets by mouth daily.   Omega-3 Fatty Acids (FISH OIL) 1200 MG CAPS Take 1,200 mg by mouth daily.   ondansetron (ZOFRAN) 4 MG tablet Take 1 tablet (4 mg total) by mouth every 6 (six) hours as needed for nausea.   promethazine (PHENERGAN) 12.5 MG tablet Take 12.5 mg by mouth See admin instructions. Take 12.5 mg by mouth every 6-8 hours as needed for nausea   sennosides-docusate sodium (SENOKOT-S) 8.6-50 MG tablet Take 1-2 tablets by mouth daily.   No facility-administered encounter medications on file as of 10/25/2021.    Allergies as of 10/25/2021 - Review Complete 08/17/2021  Allergen Reaction Noted   Amphetamine-dextroamphetamine Other (See Comments) 03/28/2019   Lupron [leuprolide] Anaphylaxis 07/05/2021   Orilissa [elagolix] Anaphylaxis 06/17/2021   Penicillins Anaphylaxis, Swelling, and Other (See Comments) 10/02/2011   Chlorhexidine Hives 08/17/2021   Doxycycline Itching, Nausea And Vomiting, and Other (See Comments) 11/21/2017   Olanzapine Other (See Comments) 10/02/2011   Other Other (See Comments) 11/21/2017   Prozac [fluoxetine hcl] Other (See Comments) 10/02/2011   Tape Other (See Comments) 07/05/2021   Triple antibiotic pain relief [neomy-bacit-polymyx-pramoxine] Hives 03/07/2021   Reglan [metoclopramide] Anxiety and Other (See Comments) 06/23/2020    Past Medical History:  Diagnosis Date   ADHD (attention deficit hyperactivity disorder)    Anemia    Anxiety    Asthma    Bipolar disorder ([place])    Complication of anesthesia    slow to wake up    Delayed gastric emptying 02/23/2020   Depression    DUB (dysfunctional uterine bleeding) 02/23/2020   Endometriosis    GERD (gastroesophageal reflux disease)    Heart murmur    Hiatal hernia 04/06/2020   Myalgia 04/06/2020   Parasitic infection 02/23/2020   Pneumonia    Poor appetite 02/23/2020   RUQ pain 02/23/2020   Vitamin D deficiency    Weight loss 02/23/2020    Past Surgical  History:  Procedure Laterality Date   barium study     COLONOSCOPY     CYSTOSCOPY N/A 07/12/2021   Procedure: CYSTOSCOPY;  Surgeon: [name], MD;  Location: [place];  Service: Gynecology;  Laterality: N/A;   ESOPHAGOGASTRODUODENOSCOPY  2011   and a ph study as well.    LAPAROSCOPY N/A 01/26/2021   Procedure: LAPAROSCOPY DIAGNOSTIC;  Surgeon: [name], MD;  Location: [place];  Service: Gynecology;  Laterality: N/A;   NASAL SEPTUM SURGERY     TONGUE FLAP     TOTAL LAPAROSCOPIC HYSTERECTOMY WITH SALPINGECTOMY Bilateral 07/12/2021   Procedure: TOTAL LAPAROSCOPIC HYSTERECTOMY WITH SALPINGECTOMY, OOPHORECTOMY;  Surgeon: [name], MD;  Location: [place];  Service: Gynecology;  Laterality: Bilateral;   WISDOM TOOTH EXTRACTION      Family History  Problem Relation Age of Onset   Depression Mother    Anxiety disorder Mother    Hypertension Mother    Hyperlipidemia Mother    Asthma Mother    Endometriosis Mother    Fibroids Mother    Fibromyalgia Mother    Migraines Mother    Depression Father    Hypertension Father  Hyperlipidemia Father    Bipolar disorder Father    Bipolar disorder Sister    ADD / ADHD Sister    Asthma Sister    Endometriosis Sister    Endometriosis Maternal Grandmother    Fibroids Maternal Grandmother    Hyperlipidemia Maternal Grandmother    Hypertension Maternal Grandmother    Diabetes Maternal Grandmother    Fibromyalgia Maternal Grandmother    Seizures Sister    Bipolar disorder Sister    Anxiety disorder Sister     Social History   Socioeconomic History   Marital status: Divorced    Spouse name: Not on file   Number of children: Not on file   Years of education: Not on file   Highest education level: Not on file  Occupational History   Not on file  Tobacco Use   Smoking status: Never   Smokeless tobacco: Never  Vaping Use   Vaping Use: Never used  Substance and Sexual Activity   Alcohol use: No   Drug  use: No   Sexual activity: Not Currently  Other Topics Concern   Not on file  Social History Narrative   Not on file   Social Determinants of Health   Financial Resource Strain: Not on file  Food Insecurity: No Food Insecurity   Worried About Running Out of Food in the Last Year: Never true   [place] in the Last Year: Never true  Transportation Needs: No Transportation Needs   Lack of Transportation (Medical): No   Lack of Transportation (Non-Medical): No  Physical Activity: Not on file  Stress: Not on file  Social Connections: Not on file  Intimate Partner Violence: Not on file      Review of systems: All other review of systems negative except as mentioned in the HPI.   Physical Exam: Vitals:   10/25/21 0837  BP: (!) 84/64  Pulse: 76  SpO2: 98%   Body mass index is 18 kg/m. Gen:      No acute distress HEENT:  sclera anicteric Abd:      soft, non-tender; no palpable masses, no distension Ext:    No edema Neuro: alert and oriented x 3 Psych: normal mood and affect  Data Reviewed:  Reviewed labs, radiology imaging, old records and pertinent past GI work up   Assessment and Plan/Recommendations:  35 year old female with history of bipolar disorder, ADD/ADHD, depression, anxiety here for follow-up visit for generalized abdominal pain, chronic constipation and nausea  Chronic idiopathic constipation: Switch to Trulance 3 mg daily  Continue with daily MiraLAX and senna as needed to have bowel movement on average daily  Increase daily water intake   Dyssynergic defecation: Refer to pelvic floor physical therapy for biofeedback  Post prandial bloating and discomfort: Likely multifactorial 4-hour gastric emptying study showed no significant delay Continue with small frequent meals Pured diet/soft foods as tolerated High-calorie high-protein diet     The patient was provided an opportunity to ask questions and all were answered. The patient agreed  with the plan and demonstrated an understanding of the instructions.  Rachel [name] , MD    CC: Charlott Rakes, MD

## 2021-10-26 NOTE — BH Specialist Note (Addendum)
Integrated Behavioral Health via Telemedicine Visit  10/26/2021 [name] [id number]  Number of [place] visits: 11 Session Start time: 10:48  Session End time: 11:13 Total time:  25  Referring Provider: Lynnda Shields, MD Patient/Family location: Home [facility] Provider location: Center for [facility] Healthcare at [facility]  All persons participating in visit: [name] and [place]   Types of Service: Individual psychotherapy and Telephone visit  I connected with [name] and/or [place]  via  Telephone or Video Enabled Telemedicine Application  (Video is Tree surgeon) and verified that I am speaking with the correct person using two identifiers. Discussed confidentiality: Yes   I discussed the limitations of telemedicine and the availability of in person appointments.  Discussed there is a possibility of technology failure and discussed alternative modes of communication if that failure occurs.  I discussed that engaging in this telemedicine visit, they consent to the provision of behavioral healthcare and the services will be billed under their insurance.  Patient and/or legal guardian expressed understanding and consented to Telemedicine visit: Yes   Presenting Concerns: Patient and/or family reports the following symptoms/concerns: Considering change in PCP offices; exploring personal life goals for 2023, including managing chronic pain with fibromyalgia, worsened with cold weather(dry heat is most helpful).  Duration of problem: Recent; Severity of problem: moderate  Patient and/or Family's Strengths/Protective Factors: Social connections, Social and Emotional competence, Concrete supports in place (healthy food, safe environments, etc.), Sense of purpose, and Physical Health (exercise, healthy diet, medication compliance, etc.)  Goals Addressed: Patient will:   Demonstrate ability to:  Formulate life goals for the upcoming year  Progress towards Goals: Ongoing  Interventions: Interventions utilized:  Solution-Focused Strategies Standardized Assessments completed: Not Needed  Patient and/or Family Response: Pt agrees with treament plan  Assessment: Patient currently experiencing PTSD, ADHD.   Patient may benefit from continued therapeutic interventions.  Plan: Follow up with behavioral health clinician on : Two weeks Behavioral recommendations:  -Continue using self-coping strategies that remain helpful in managing symptoms -Research for the week: Find hotels and/or gyms with saunas (compare cost of memberships and locations); make or obtain "rice sock"  -Use PCP list on AVS as needed Referral(s): [place] (In Clinic)  I discussed the assessment and treatment plan with the patient and/or parent/guardian. They were provided an opportunity to ask questions and all were answered. They agreed with the plan and demonstrated an understanding of the instructions.   They were advised to call back or seek an in-person evaluation if the symptoms worsen or if the condition fails to improve as anticipated.  Caroleen Hamman Cyndal Kasson, LCSW

## 2021-10-28 ENCOUNTER — Telehealth: Payer: Self-pay | Admitting: *Deleted

## 2021-10-28 MED ORDER — TRULANCE 3 MG PO TABS: 1.0000 | ORAL_TABLET | Freq: Every day | ORAL | 3 refills | Status: DC

## 2021-10-28 NOTE — Telephone Encounter (Signed)
Sent prior auth to Cover my MEds and it was approved until 10/28/2022  Pharmacy notified along with new prescription

## 2021-10-31 ENCOUNTER — Encounter: Payer: Self-pay | Admitting: Gastroenterology

## 2021-11-02 ENCOUNTER — Ambulatory Visit: Payer: Medicaid Other | Admitting: Obstetrics and Gynecology

## 2021-11-09 ENCOUNTER — Other Ambulatory Visit: Payer: Self-pay

## 2021-11-09 ENCOUNTER — Ambulatory Visit (INDEPENDENT_AMBULATORY_CARE_PROVIDER_SITE_OTHER): Payer: Medicaid Other | Admitting: Clinical

## 2021-11-09 DIAGNOSIS — F909 Attention-deficit hyperactivity disorder, unspecified type: Secondary | ICD-10-CM | POA: Diagnosis not present

## 2021-11-09 DIAGNOSIS — F4310 Post-traumatic stress disorder, unspecified: Secondary | ICD-10-CM | POA: Diagnosis not present

## 2021-11-09 NOTE — Patient Instructions (Signed)
Center for [facility] Healthcare at [facility] [place], [place] [address] [phone] (main office) [phone] ([place] office)  [place] primary care offices possibly accepting new patients:   Primary Care at [facility] [address] [place], [place] [address] [phone]  [facility] at [facility] [place], [place] [address] [place] and [facility] [address] [place], [place] [address] [place] [address] [place], [place] [address] [phone]  Patient [place] [address] [place],  [place]  [address] [phone]
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
## 2021-11-28 ENCOUNTER — Ambulatory Visit: Payer: Medicaid Other | Admitting: Clinical

## 2021-11-28 DIAGNOSIS — F909 Attention-deficit hyperactivity disorder, unspecified type: Secondary | ICD-10-CM

## 2021-11-28 DIAGNOSIS — Z658 Other specified problems related to psychosocial circumstances: Secondary | ICD-10-CM

## 2021-11-28 DIAGNOSIS — F319 Bipolar disorder, unspecified: Secondary | ICD-10-CM

## 2021-11-28 DIAGNOSIS — F4310 Post-traumatic stress disorder, unspecified: Secondary | ICD-10-CM

## 2021-11-30 ENCOUNTER — Ambulatory Visit: Payer: Medicaid Other | Admitting: Obstetrics and Gynecology

## 2021-12-02 NOTE — BH Specialist Note (Deleted)
Integrated Behavioral Health via Telemedicine Visit  12/02/2021 Rachel [name] [id number]  Number of [place] visits: *** Session Start time: 10:45***  Session End time: 11:15*** Total time: {IBH Total Time:21014050}  Referring Provider: *** Patient/Family location: Home*** [facility] Provider location: Center for [place] at [facility]  All persons participating in visit: Patient *** and Rachel [name] ***  Types of Service: {CHL AMB TYPE OF SERVICE:5628119259}  I connected with Rachel [name] [name]'s {family members:20773} via  Telephone or Geologist, engineering  (Video is Tree surgeon) and verified that I am speaking with the correct person using two identifiers. Discussed confidentiality: {YES/NO:21197}  I discussed the limitations of telemedicine and the availability of in person appointments.  Discussed there is a possibility of technology failure and discussed alternative modes of communication if that failure occurs.  I discussed that engaging in this telemedicine visit, they consent to the provision of behavioral healthcare and the services will be billed under their insurance.  Patient and/or legal guardian expressed understanding and consented to Telemedicine visit: {YES/NO:21197}  Presenting Concerns: Patient and/or family reports the following symptoms/concerns: *** Duration of problem: ***; Severity of problem: {Mild/Moderate/Severe:20260}  Patient and/or Family's Strengths/Protective Factors: {CHL AMB BH PROTECTIVE [phone]}  Goals Addressed: Patient will:  Reduce symptoms of: {IBH Symptoms:21014056}   Increase knowledge and/or ability of: {IBH Patient Tools:21014057}   Demonstrate ability to: {IBH Goals:21014053}  Progress towards Goals: {CHL AMB BH PROGRESS TOWARDS GOALS:7070195982}  Interventions: Interventions utilized:  {IBH  Interventions:21014054} Standardized Assessments completed: {IBH Screening Tools:21014051}  Patient and/or Family Response: ***  Assessment: Patient currently experiencing ***.   Patient may benefit from ***.  [name]: Follow up with behavioral health clinician on : *** Behavioral recommendations: *** Referral(s): {IBH Referrals:21014055}  I discussed the assessment and treatment [name] with the patient and/or parent/guardian. They were provided an opportunity to ask questions and all were answered. They agreed with the [name] and demonstrated an understanding of the instructions.   They were advised to call back or seek an in-person evaluation if the symptoms worsen or if the condition fails to improve as anticipated.  Caroleen Hamman Rona Tomson, LCSW

## 2021-12-15 ENCOUNTER — Telehealth: Payer: Self-pay | Admitting: Family Medicine

## 2021-12-15 NOTE — Telephone Encounter (Signed)
Pt called in to request a Rx for Tamiflu. Pt says that she is immune compromised and her father just tested positive for the flu. Pt says that she just got over COVID and is in fear to catch the flu. Pt says that she has the following sx, body aches, chillls, sore throat.     Pharmacy:  [facility] [id number] [place], [place] Phone:  [phone]  Fax:  [phone]       [phone]- please assist pt further.

## 2021-12-15 NOTE — Telephone Encounter (Signed)
Will route to PCP's nurse. Pt may have to have a visit.

## 2021-12-15 NOTE — Telephone Encounter (Signed)
Does patient need to come in for rapid flu test?

## 2021-12-16 ENCOUNTER — Emergency Department (INDEPENDENT_AMBULATORY_CARE_PROVIDER_SITE_OTHER)
Admission: EM | Admit: 2021-12-16 | Discharge: 2021-12-16 | Disposition: A | Discharge status: Home / Self Care | Payer: Medicaid Other | Source: Home / Self Care | Attending: Family Medicine | Admitting: Family Medicine

## 2021-12-16 ENCOUNTER — Other Ambulatory Visit: Payer: Self-pay

## 2021-12-16 ENCOUNTER — Encounter: Payer: Self-pay | Admitting: Emergency Medicine

## 2021-12-16 DIAGNOSIS — J4520 Mild intermittent asthma, uncomplicated: Secondary | ICD-10-CM | POA: Diagnosis not present

## 2021-12-16 DIAGNOSIS — J111 Influenza due to unidentified influenza virus with other respiratory manifestations: Secondary | ICD-10-CM | POA: Diagnosis not present

## 2021-12-16 LAB — POCT INFLUENZA A/B
Influenza A, POC: NEGATIVE
Influenza B, POC: NEGATIVE

## 2021-12-16 LAB — POC SARS CORONAVIRUS 2 AG -  ED: SARS Coronavirus 2 Ag: NEGATIVE

## 2021-12-16 MED ORDER — OSELTAMIVIR PHOSPHATE 75 MG PO CAPS: 75.0000 mg | ORAL_CAPSULE | Freq: Two times a day (BID) | ORAL | 0 refills | Status: AC

## 2021-12-16 MED ORDER — ALBUTEROL SULFATE HFA 108 (90 BASE) MCG/ACT IN AERS: 1.0000 | INHALATION_SPRAY | Freq: Four times a day (QID) | RESPIRATORY_TRACT | 1 refills | Status: DC | PRN

## 2021-12-16 MED ORDER — ALBUTEROL SULFATE (2.5 MG/3ML) 0.083% IN NEBU: 2.5000 mg | INHALATION_SOLUTION | Freq: Four times a day (QID) | RESPIRATORY_TRACT | 0 refills | Status: DC | PRN

## 2021-12-16 MED ORDER — AZITHROMYCIN 250 MG PO TABS: ORAL_TABLET | ORAL | 0 refills | Status: DC

## 2021-12-16 NOTE — BH Specialist Note (Addendum)
Integrated Behavioral Health via Telemedicine Visit  12/16/2021 Rachel [name] [id number]  Number of [place] visits: 88 Session Start time: 1:10  Session End time: 1:54 Total time:  25  Referring Provider: Lynnda Shields, MD Patient/Family location: Home [facility] Provider location: Center for [facility]  All persons participating in visit: [name] and [name]   Types of Service: Individual psychotherapy and Telephone visit  I connected with Rachel [name] [name]  via  Telephone or Video Enabled Telemedicine Application  (Video is Tree surgeon) and verified that I am speaking with the correct person using two identifiers. Discussed confidentiality: Yes   I discussed the limitations of telemedicine and the availability of in person appointments.  Discussed there is a possibility of technology failure and discussed alternative modes of communication if that failure occurs.  I discussed that engaging in this telemedicine visit, they consent to the provision of behavioral healthcare and the services will be billed under their insurance.  Patient and/or legal guardian expressed understanding and consented to Telemedicine visit: Yes   Presenting Concerns: Patient and/or family reports the following symptoms/concerns: Navigating current emotional response to emotionally-charged past experiences Duration of problem: Ongoing; Severity of problem: moderate  Patient and/or Family's Strengths/Protective Factors: Social connections, Social and Emotional competence, Concrete supports in place (healthy food, safe environments, etc.), Sense of purpose, and Physical Health (exercise, healthy diet, medication compliance, etc.)  Goals Addressed: Patient will:  Demonstrate ability to: Increase motivation to adhere to [name] of care  Progress towards  Goals: Ongoing  Interventions: Interventions utilized:  ACT (Acceptance and Commitment Therapy) Standardized Assessments completed: Not Needed  Patient and/or Family Response: Pt agrees with continuing treatment [name]  Assessment: Patient currently experiencing PTSD, ADHD (both as previously diagnosed)  Patient may benefit from continued therapeutic interventions.  [name]: Follow up with behavioral health clinician on : Two weeks Behavioral recommendations:  -Continue using self-coping strategies that have been helpful in managing emotions -Write down negatives in previous relationship, as discussed, as self-reminders -Consider planning activities in current relationship, as discussed (ex. [place] beach trip, etc.) Referral(s): [place] (In Clinic)  I discussed the assessment and treatment [name] with the patient and/or parent/guardian. They were provided an opportunity to ask questions and all were answered. They agreed with the [name] and demonstrated an understanding of the instructions.   They were advised to call back or seek an in-person evaluation if the symptoms worsen or if the condition fails to improve as anticipated.  [name], LCSW  Depression screen [facility] 2/9 10/19/2021 06/20/2021 06/17/2021 05/02/2021 02/21/2021  Decreased Interest 1 0 0 0 0  Down, Depressed, Hopeless 1 0 0 0 0  PHQ - 2 Score 2 0 0 0 0  Altered sleeping 1 3 - 2 1  Tired, decreased energy 0 2 - 2 1  Change in appetite 0 3 - 2 2  Feeling bad or failure about yourself  1 0 - 0 0  Trouble concentrating 1 1 - 0 0  Moving slowly or fidgety/restless 0 0 - 0 0  Suicidal thoughts 0 0 - 0 0  PHQ-9 Score 5 9 - 6 4  Some recent data might be hidden   GAD 7 : Generalized Anxiety Score 10/19/2021 06/20/2021 06/17/2021 05/02/2021  Nervous, Anxious, on Edge 1 1 1  0  Control/stop worrying 0 0 1 0  Worry too much - different things 1 0 0 0  Trouble relaxing 1 1 0 0  Restless 0 0 0 0  Easily  annoyed or irritable 1 0 0 0  Afraid - awful might happen 1 0 0 0  Total GAD 7 Score 5 2 2  0

## 2021-12-16 NOTE — ED Provider Notes (Signed)
[name]    CSN: [id number] Arrival date & time: 12/16/21  1512      History   Chief Complaint Chief Complaint  Patient presents with   Cough    HPI Rachel [name] is a 36 y.o. female.   Last night patient suddenly developed headache, fatigue, myalgias, and cough.  This morning she had a fever to 102.5.  She states that her father has just tested positive for influenza.  She states that she has a history of intermittent asthma and gets several URI's each year.  She had pneumonia in 2019.  She uses albuterol by nebulizer and requests a refill.  The history is provided by the patient.   Past Medical History:  Diagnosis Date   ADHD (attention deficit hyperactivity disorder)    Anemia    Anxiety    Asthma    Bipolar disorder ([place])    Complication of anesthesia    slow to wake up    Delayed gastric emptying 02/23/2020   Depression    DUB (dysfunctional uterine bleeding) 02/23/2020   Endometriosis    GERD (gastroesophageal reflux disease)    Heart murmur    Hiatal hernia 04/06/2020   Myalgia 04/06/2020   Parasitic infection 02/23/2020   Pneumonia    Poor appetite 02/23/2020   RUQ pain 02/23/2020   Vitamin D deficiency    Weight loss 02/23/2020    Patient Active Problem List   Diagnosis Date Noted   Vasomotor symptoms due to menopause 08/17/2021   S/P laparoscopic hysterectomy 07/12/2021   Preoperative exam for gynecologic surgery 06/20/2021   Adverse drug reaction 06/01/2021   Draining postoperative wound 02/28/2021   Encounter for postoperative care 02/16/2021   Endometriosis determined by laparoscopy    Pelvic pain in female 10/11/2020   Presence of subdermal contraceptive implant 10/11/2020   Dysmenorrhea 10/11/2020   Dyspareunia in female 10/11/2020   NSVT (nonsustained ventricular tachycardia) 09/03/2020   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   Palpitations 07/26/2020   Family history of connective tissue disease 07/26/2020   Myalgia  04/06/2020   Hiatal hernia 04/06/2020   Parasitic infection 02/23/2020   Weight loss 02/23/2020   Poor appetite 02/23/2020   RUQ pain 02/23/2020   Delayed gastric emptying 02/23/2020   DUB (dysfunctional uterine bleeding) 02/23/2020   Cervical radiculopathy 05/31/2018   Hyperalgesia 05/31/2018   Acid reflux 12/26/2017   ADHD (attention deficit hyperactivity disorder) 05/07/2012   Bipolar 1 disorder ([place]) 05/07/2012    Past Surgical History:  Procedure Laterality Date   barium study     COLONOSCOPY     CYSTOSCOPY N/A 07/12/2021   Procedure: CYSTOSCOPY;  Surgeon: [name], MD;  Location: [place] [place];  Service: Gynecology;  Laterality: N/A;   ESOPHAGOGASTRODUODENOSCOPY  2011   and a ph study as well.    LAPAROSCOPY N/A 01/26/2021   Procedure: LAPAROSCOPY DIAGNOSTIC;  Surgeon: [name], MD;  Location: [place] [facility];  Service: Gynecology;  Laterality: N/A;   NASAL SEPTUM SURGERY     TONGUE FLAP     TOTAL LAPAROSCOPIC HYSTERECTOMY WITH SALPINGECTOMY Bilateral 07/12/2021   Procedure: TOTAL LAPAROSCOPIC HYSTERECTOMY WITH SALPINGECTOMY, OOPHORECTOMY;  Surgeon: [name], MD;  Location: [place];  Service: Gynecology;  Laterality: Bilateral;   UPPER GASTROINTESTINAL ENDOSCOPY     WISDOM TOOTH EXTRACTION      OB History     Gravida  0   Para  0   Term  0   Preterm  0   AB  0   Living  0      SAB  0   IAB  0   Ectopic  0   Multiple  0   Live Births  0            Home Medications    Prior to Admission medications   Medication Sig Start Date End Date Taking? Authorizing Provider  albuterol (PROVENTIL) (2.5 MG/3ML) 0.083% nebulizer solution Take 3 mLs (2.5 mg total) by nebulization every 6 (six) hours as needed for wheezing or shortness of breath. Max 4 doses per day 12/16/21  Yes Kandra Nicolas, MD  azithromycin (ZITHROMAX Z-PAK) 250 MG tablet Take 2 tabs today; then begin one tab once daily for 4 more days. 12/16/21  Yes Kandra Nicolas, MD  oseltamivir (TAMIFLU) 75 MG capsule Take 1 capsule (75 mg total) by mouth 2 (two) times daily for 5 days. 12/16/21 12/21/21 Yes Kandra Nicolas, MD  albuterol (VENTOLIN HFA) 108 (90 Base) MCG/ACT inhaler Inhale 1-2 puffs into the lungs every 6 (six) hours as needed for wheezing or shortness of breath. 12/16/21   Kandra Nicolas, MD  Ascorbic Acid (VITAMIN C) 1000 MG tablet Take 1,000 mg by mouth 2 (two) times daily.    [provider]  Calcium-Magnesium-Vitamin D 600-40-500 MG-MG-UNIT TB24 Take 3 tablets by mouth in the morning and at bedtime.    [provider]  Cholecalciferol (VITAMIN D3) 50 MCG (2000 UT) TABS Take 2,000 Units by mouth daily.    [provider]  EPINEPHrine 0.3 mg/0.3 mL IJ SOAJ injection Inject 0.3 mg into the muscle as needed for anaphylaxis. 05/27/21   Levin Bacon, MD  estrogens, conjugated, (PREMARIN) 0.45 MG tablet Take 1 tablet (0.45 mg total) by mouth daily. 08/17/21   [name], MD  fexofenadine (ALLEGRA) 180 MG tablet Take 180 mg by mouth daily.    [provider]  ibuprofen (ADVIL) 600 MG tablet Take 1 tablet (600 mg total) by mouth every 6 (six) hours as needed for headache, mild pain, moderate pain or cramping. Patient not taking: Reported on 12/16/2021 07/13/21   [name], MD  LINZESS 290 MCG CAPS capsule TAKE ONE CAPSULE BY MOUTH DAILY BEFORE BREAKFAST Patient not taking: Reported on 12/16/2021 09/17/21   Charlott Rakes, MD  Melatonin 10 MG TABS Take 10 mg by mouth at bedtime.    [provider]  MIRALAX 17 GM/SCOOP powder Take 17 g by mouth See admin instructions. Mix 17 g of powder into water and drink once a day    [provider]  Multiple Vitamins-Minerals (ADULT ONE DAILY GUMMIES PO) Take 2 tablets by mouth daily.    [provider]  Omega-3 Fatty Acids (FISH OIL) 1200 MG CAPS Take 1,200 mg by mouth daily.    [provider]  ondansetron (ZOFRAN) 4 MG tablet Take 1  tablet (4 mg total) by mouth every 6 (six) hours as needed for nausea. Patient not taking: Reported on 12/16/2021 07/13/21   [name], MD  Plecanatide (TRULANCE) 3 MG TABS Take 1 capsule by mouth daily at 2 am. 10/28/21   Mauri Pole, MD  promethazine (PHENERGAN) 12.5 MG tablet Take 12.5 mg by mouth See admin instructions. Take 12.5 mg by mouth every 6-8 hours as needed for nausea    [provider]  sennosides-docusate sodium (SENOKOT-S) 8.6-50 MG tablet Take 1-2 tablets by mouth daily.    [provider]    Family History  Family History  Problem Relation Age of Onset   Colon polyps Mother    Depression Mother    Anxiety disorder Mother    Hypertension Mother    Hyperlipidemia Mother    Asthma Mother    Endometriosis Mother    Fibroids Mother    Fibromyalgia Mother    Migraines Mother    Irritable bowel syndrome Mother    Depression Father    Hypertension Father    Hyperlipidemia Father    Bipolar disorder Father    Bipolar disorder Sister    ADD / ADHD Sister    Asthma Sister    Endometriosis Sister    Seizures Sister    Bipolar disorder Sister    Anxiety disorder Sister    Heart disease Maternal Grandmother    Endometriosis Maternal Grandmother    Fibroids Maternal Grandmother    Hyperlipidemia Maternal Grandmother    Hypertension Maternal Grandmother    Diabetes Maternal Grandmother    Fibromyalgia Maternal Grandmother    Heart disease Maternal Grandfather    Diabetes Maternal Grandfather    Heart disease Paternal Grandmother    Heart disease Paternal Grandfather    Colon cancer Maternal Great-grandmother    Pancreatic cancer Neg Hx    Esophageal cancer Neg Hx    Stomach cancer Neg Hx     Social History Social History   Tobacco Use   Smoking status: Never   Smokeless tobacco: Never  Vaping Use   Vaping Use: Never used  Substance Use Topics   Alcohol use: Yes    Alcohol/week: 1.0 standard drink    Types: 1 Glasses of wine  per week    Comment: 1 glass of wine per week   Drug use: No     Allergies   Amphetamine-dextroamphetamine, Lupron [leuprolide], Orilissa [elagolix], Penicillins, Chlorhexidine, Doxycycline, Olanzapine, Other, Prozac [fluoxetine hcl], Tape, Triple antibiotic pain relief [neomy-bacit-polymyx-pramoxine], and Reglan [metoclopramide]   Review of Systems Review of Systems No sore throat + cough No pleuritic pain No wheezing ? nasal congestion No post-nasal drainage No sinus pain/pressure No itchy/red eyes No earache No hemoptysis No SOB + fever, + chills No nausea No vomiting No abdominal pain No diarrhea No urinary symptoms No skin rash + fatigue + myalgias + headache   Physical Exam Triage Vital Signs ED Triage Vitals  Enc Vitals Group     BP 12/16/21 1551 127/83     Pulse Rate 12/16/21 1551 92     Resp 12/16/21 1551 18     Temp 12/16/21 1551 100.2 F (37.9 C)     Temp Source 12/16/21 1551 Oral     SpO2 12/16/21 1551 100 %     Weight 12/16/21 1554 91 lb (41.3 kg)     Height 12/16/21 1554 5' 2.5" (1.588 m)     Head Circumference --      Peak Flow --      Pain Score 12/16/21 1553 7     Pain Loc --      Pain Edu? --      Excl. in [place]? --    No data found.  Updated Vital Signs BP 127/83 (BP Location: Right Arm)    Pulse 92    Temp 100.2 F (37.9 C) (Oral)    Resp 18    Ht 5' 2.5" (1.588 m)    Wt 41.3 kg    LMP  (LMP Unknown)    SpO2 100%    BMI 16.38 kg/m   Visual Acuity Right Eye  Distance:   Left Eye Distance:   Bilateral Distance:    Right Eye Near:   Left Eye Near:    Bilateral Near:     Physical Exam Nursing notes and Vital Signs reviewed. Appearance:  Patient appears stated age, and in no acute distress Eyes:  Pupils are equal, round, and reactive to light and accomodation.  Extraocular movement is intact.  Conjunctivae are not inflamed  Ears:  Canals normal.  Tympanic membranes normal.  Nose:  Mildly congested turbinates.  No sinus tenderness.   Pharynx:  Normal Neck:  Supple.  Mildly enlarged lateral nodes are present, tender to palpation on the left.   Lungs:  Clear to auscultation.  Breath sounds are equal.  Moving air well. Heart:  Regular rate and rhythm without murmurs, rubs, or gallops.  Abdomen:  Nontender without masses or hepatosplenomegaly.  Bowel sounds are present.  No CVA or flank tenderness.  Extremities:  No edema.  Skin:  No rash present.   UC Treatments / Results  Labs (all labs ordered are listed, but only abnormal results are displayed) Labs Reviewed  POCT INFLUENZA A/B negative  POC SARS CORONAVIRUS 2 AG -  ED negative    EKG   Radiology No results found.  Procedures Procedures (including critical care time)  Medications Ordered in UC Medications - No data to display  Initial Impression / Assessment and Plan / UC Course  I have reviewed the triage vital signs and the nursing notes.  Pertinent labs & imaging results that were available during my care of the patient were reviewed by me and considered in my medical decision making (see chart for details).    ?false negative rapid flu test.  Begin empiric Tamiflu.  Because of her frequent URI's and history of pneumonia will begin a Z-pack. Refill albuterol MDI and albuterol neb solution. Followup with Family Doctor if not improved in about 5 days.  Final Clinical Impressions(s) / UC Diagnoses   Final diagnoses:  Influenza-like illness     Discharge Instructions      Take plain guaifenesin (1200mg  extended release tabs such as Mucinex) twice daily, with plenty of water, for cough and congestion. Get adequate rest.   Also recommend using saline nasal spray several times daily and saline nasal irrigation (AYR is a common brand).    May take Delsym Cough Suppressant ("12 Hour Cough Relief") at bedtime for nighttime cough.  Try warm salt water gargles for sore throat.  Stop all antihistamines for now, and other non-prescription cough/cold  preparations.   If symptoms become significantly worse during the night or over the weekend, proceed to the local emergency room.     ED Prescriptions     Medication Sig Dispense Auth. Provider   oseltamivir (TAMIFLU) 75 MG capsule Take 1 capsule (75 mg total) by mouth 2 (two) times daily for 5 days. 10 capsule Kandra Nicolas, MD   azithromycin (ZITHROMAX Z-PAK) 250 MG tablet Take 2 tabs today; then begin one tab once daily for 4 more days. 6 tablet Kandra Nicolas, MD   albuterol (VENTOLIN HFA) 108 (90 Base) MCG/ACT inhaler Inhale 1-2 puffs into the lungs every 6 (six) hours as needed for wheezing or shortness of breath. 8 g Kandra Nicolas, MD   albuterol (PROVENTIL) (2.5 MG/3ML) 0.083% nebulizer solution Take 3 mLs (2.5 mg total) by nebulization every 6 (six) hours as needed for wheezing or shortness of breath. Max 4 doses per day 75 mL Kandra Nicolas, MD  Kandra Nicolas, MD 12/19/21 [phone]

## 2021-12-16 NOTE — Discharge Instructions (Signed)
Take plain guaifenesin (1200mg  extended release tabs such as Mucinex) twice daily, with plenty of water, for cough and congestion. Get adequate rest.   Also recommend using saline nasal spray several times daily and saline nasal irrigation (AYR is a common brand).    May take Delsym Cough Suppressant ("12 Hour Cough Relief") at bedtime for nighttime cough.  Try warm salt water gargles for sore throat.  Stop all antihistamines for now, and other non-prescription cough/cold preparations.   If symptoms become significantly worse during the night or over the weekend, proceed to the local emergency room.

## 2021-12-16 NOTE — ED Triage Notes (Signed)
Flu exposure - father is positive  Fever was 102 this morning  Tylenol at 2pm & Ibuprofen at 1200 Flu vaccine last year  Pt usually gets flu 3 times a year Body aches

## 2021-12-16 NOTE — Telephone Encounter (Signed)
Yes.  She needs a visit or she can go to urgent care.

## 2021-12-19 ENCOUNTER — Ambulatory Visit (INDEPENDENT_AMBULATORY_CARE_PROVIDER_SITE_OTHER): Payer: Medicaid Other | Admitting: Clinical

## 2021-12-19 DIAGNOSIS — F4310 Post-traumatic stress disorder, unspecified: Secondary | ICD-10-CM | POA: Diagnosis not present

## 2021-12-19 DIAGNOSIS — F909 Attention-deficit hyperactivity disorder, unspecified type: Secondary | ICD-10-CM

## 2021-12-19 NOTE — Telephone Encounter (Signed)
Pt has been seen in ED 

## 2021-12-21 NOTE — BH Specialist Note (Addendum)
Integrated Behavioral Health via Telemedicine Visit  01/03/2022 Rachel [name] [id number]  Number of [place] Clinician visits: Additional Visit  Session Start time: 7408    Session End time: 1448  Total time in minutes: 60   Referring Provider: Lynnda Shields, MD Patient/Family location: Home [facility] Provider location: Center for [facility] at [facility]  All persons participating in visit: Patient [name] and [name]   Types of Service: Individual psychotherapy and Telephone visit  I connected with Rachel [name] [name]  via  Telephone or Video Enabled Telemedicine Application  (Video is Tree surgeon) and verified that I am speaking with the correct person using two identifiers. Discussed confidentiality: Yes   I discussed the limitations of telemedicine and the availability of in person appointments.  Discussed there is a possibility of technology failure and discussed alternative modes of communication if that failure occurs.  I discussed that engaging in this telemedicine visit, they consent to the provision of behavioral healthcare and the services will be billed under their insurance.  Patient and/or legal guardian expressed understanding and consented to Telemedicine visit: Yes   Presenting Concerns: Patient and/or family reports the following symptoms/concerns: Processing feelings regarding past and current relationships.  Duration of problem: Ongoing; Severity of problem: mild  Patient and/or Family's Strengths/Protective Factors: Social connections, Social and Emotional competence, Concrete supports in place (healthy food, safe environments, etc.), Sense of purpose, and Physical Health (exercise, healthy diet, medication compliance, etc.)  Goals Addressed: Patient will:   Demonstrate ability to: Increase motivation to adhere to [name] of  care  Progress towards Goals: Ongoing  Interventions: Interventions utilized:  ACT (Acceptance and Commitment Therapy) Standardized Assessments completed: Not Needed  Patient and/or Family Response: Pt agrees with treatment [name]  Assessment: Patient currently experiencing PTSD, ADHD (both previously diagnosed).   Patient may benefit from continued integrated therapeutic interventions.  [name]: Follow up with behavioral health clinician on : Two weeks Behavioral recommendations:  -Continue allowing yourself to notice what you are feeling in the moment, without attempts to fight them  (ex. ocean waves) -Continue self-coping strategies (virtual race, photography, gym, art, etc.)  -Be kind to yourself daily.  Referral(s): [place] (In Clinic)  I discussed the assessment and treatment [name] with the patient and/or parent/guardian. They were provided an opportunity to ask questions and all were answered. They agreed with the [name] and demonstrated an understanding of the instructions.   They were advised to call back or seek an in-person evaluation if the symptoms worsen or if the condition fails to improve as anticipated.  [name], LCSW  Depression screen [facility] 2/9 10/19/2021 06/20/2021 06/17/2021 05/02/2021 02/21/2021  Decreased Interest 1 0 0 0 0  Down, Depressed, Hopeless 1 0 0 0 0  PHQ - 2 Score 2 0 0 0 0  Altered sleeping 1 3 - 2 1  Tired, decreased energy 0 2 - 2 1  Change in appetite 0 3 - 2 2  Feeling bad or failure about yourself  1 0 - 0 0  Trouble concentrating 1 1 - 0 0  Moving slowly or fidgety/restless 0 0 - 0 0  Suicidal thoughts 0 0 - 0 0  PHQ-9 Score 5 9 - 6 4  Some recent data might be hidden   GAD 7 : Generalized Anxiety Score 10/19/2021 06/20/2021 06/17/2021 05/02/2021  Nervous, Anxious, on Edge 1 1 1  0  Control/stop worrying 0  0 1 0  Worry too much - different things 1 0 0 0  Trouble relaxing 1 1 0 0  Restless 0 0 0 0  Easily  annoyed or irritable 1 0 0 0  Afraid - awful might happen 1 0 0 0  Total GAD 7 Score 5 2 2  0

## 2022-01-03 ENCOUNTER — Ambulatory Visit: Payer: Medicaid Other | Admitting: Gastroenterology

## 2022-01-03 ENCOUNTER — Encounter: Payer: Self-pay | Admitting: Gastroenterology

## 2022-01-03 ENCOUNTER — Ambulatory Visit (INDEPENDENT_AMBULATORY_CARE_PROVIDER_SITE_OTHER): Payer: Medicaid Other | Admitting: Clinical

## 2022-01-03 VITALS — HR 70 | Ht 62.5 in | Wt 98.2 lb

## 2022-01-03 DIAGNOSIS — F4310 Post-traumatic stress disorder, unspecified: Secondary | ICD-10-CM

## 2022-01-03 DIAGNOSIS — M6289 Other specified disorders of muscle: Secondary | ICD-10-CM

## 2022-01-03 DIAGNOSIS — K581 Irritable bowel syndrome with constipation: Secondary | ICD-10-CM

## 2022-01-03 DIAGNOSIS — K5904 Chronic idiopathic constipation: Secondary | ICD-10-CM | POA: Diagnosis not present

## 2022-01-03 DIAGNOSIS — F909 Attention-deficit hyperactivity disorder, unspecified type: Secondary | ICD-10-CM

## 2022-01-03 MED ORDER — LINACLOTIDE 290 MCG PO CAPS: 290.0000 ug | ORAL_CAPSULE | Freq: Every day | ORAL | 0 refills | Status: DC

## 2022-01-03 NOTE — Progress Notes (Signed)
Rachel [name]    [id number]    [birth year]  Primary Care Physician:Newlin, Charlane Ferretti, MD  Referring Physician: Charlott Rakes, MD [place],  [place] [address]   Chief complaint: Constipation, abdominal bloating and discomfort  HPI:  36 year old very pleasant female here for follow-up visit accompanied by her mother for chronic constipation, bloating and discomfort.    She feels constipation is worse with Trulance, she is having bowel movement once every 7 to 10 days and is having to take significantly large amount of additional laxatives [Senokot and up to 3 capfuls of MiraLAX daily].  She feels Linzess 290 mcg was better, she was at least having a bowel movement once every 3 days with it, even though she did need to use additional senna and MiraLAX. She has not received a call from pelvic floor physical therapy, they have not scheduled her appointment yet.  GI history: She was seen at [facility] hospital in 2021-22    07/12/21 S/p Total laparoscopic hysterectomy, bilateral salpingo-oophorectomy, cystoscopy for pelvic pain. Negative for endometriosis on surgical resected specimen. 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   On average she has bowel movement once every 3 to 4 days, she is using Linzess, MiraLAX and senna   She continues to have abdominal bloating and discomfort limiting p.o. intake.  Her weight has been stable, denies any significant weight loss.  No vomiting but continues to have intermittent episodes of nausea.   GI series with small bowel follow-through April 28, 2020: No hiatal hernia, no significant  gastroesophageal reflux.  Gastric emptying was subjectively delayed.  Delayed small bowel transit.  No fixed filling defects or obstruction.   EGD May 20,2021 concerning for large hiatal hernia and partial gastric volvulus.  LA grade a esophagitis otherwise unremarkable exam   Subsequent CT abdomen pelvis with contrast on Apr 08, 2020 was negative for hiatal hernia or any pathology   Colonoscopy Apr 01, 2020: Mild melanosis, internal hemorrhoids otherwise normal exam   Right upper quadrant abdominal ultrasound March 01, 2020: Normal   Transvaginal pelvic ultrasound Mar 17, 2020: Normal   In 2009 2-hour gastric emptying study showed delayed emptying with 83% retention at 1 hour and 50% retained gastric contents at the end of 2 hours    She was treated for possible parasitic infection with albendazole in April 2021, saw Dr. Drucilla Schmidt in [place], follow-up labs were unremarkable with in normal range.   Celiac studies, ANCA , ESR, CRP, anti-Saccharomyces Cerave Cl ab,  HIV, viral hepatitis studies.  Albumin 4.3, Prealbumin 25 within normal range. Thyroid studies WNL.     Outpatient Encounter Medications as of 01/03/2022  Medication Sig   albuterol (PROVENTIL) (2.5 MG/3ML) 0.083% nebulizer solution Take 3 mLs (2.5 mg total) by nebulization every 6 (six) hours as needed for wheezing or shortness of breath. Max 4 doses per day   albuterol (VENTOLIN HFA) 108 (90 Base) MCG/ACT inhaler Inhale 1-2 puffs into the lungs every 6 (six) hours as needed for wheezing or shortness of breath.   Ascorbic Acid (VITAMIN C) 1000 MG tablet Take 1,000 mg by mouth 2 (two) times daily.   Calcium-Magnesium-Vitamin D [id number]  MG-MG-UNIT TB24 Take 3 tablets by mouth in the morning and at bedtime.   Cholecalciferol (VITAMIN D3) 50 MCG (2000 UT) TABS Take 2,000 Units by mouth daily.   EPINEPHrine 0.3 mg/0.3 mL IJ SOAJ injection Inject 0.3 mg into the muscle as needed for anaphylaxis.   estrogens, conjugated, (PREMARIN) 0.45 MG  tablet Take 1 tablet (0.45 mg total) by mouth daily.   fexofenadine (ALLEGRA) 180 MG tablet Take 180 mg by mouth daily.   ibuprofen (ADVIL) 600 MG tablet Take 1 tablet (600 mg total) by mouth every 6 (six) hours as needed for headache, mild pain, moderate pain or cramping.   Melatonin 10 MG TABS Take 10 mg by mouth at bedtime.   MIRALAX 17 GM/SCOOP powder Take 17 g by mouth See admin instructions. Mix 17 g of powder into water and drink once a day   Multiple Vitamins-Minerals (ADULT ONE DAILY GUMMIES PO) Take 2 tablets by mouth daily.   Omega-3 Fatty Acids (FISH OIL) 1200 MG CAPS Take 1,200 mg by mouth daily.   ondansetron (ZOFRAN) 4 MG tablet Take 1 tablet (4 mg total) by mouth every 6 (six) hours as needed for nausea.   Plecanatide (TRULANCE) 3 MG TABS Take 1 capsule by mouth daily at 2 am.   promethazine (PHENERGAN) 12.5 MG tablet Take 12.5 mg by mouth See admin instructions. Take 12.5 mg by mouth every 6-8 hours as needed for nausea   sennosides-docusate sodium (SENOKOT-S) 8.6-50 MG tablet Take 1-2 tablets by mouth daily.   [DISCONTINUED] azithromycin (ZITHROMAX Z-PAK) 250 MG tablet Take 2 tabs today; then begin one tab once daily for 4 more days.   [DISCONTINUED] LINZESS 290 MCG CAPS capsule TAKE ONE CAPSULE BY MOUTH DAILY BEFORE BREAKFAST (Patient not taking: Reported on 12/16/2021)   No facility-administered encounter medications on file as of 01/03/2022.    Allergies as of 01/03/2022 - Review Complete 01/03/2022  Allergen Reaction Noted   Amphetamine-dextroamphetamine Other (See Comments) 03/28/2019   Lupron [leuprolide] Anaphylaxis 07/05/2021   Orilissa [elagolix] Anaphylaxis 06/17/2021   Penicillins Anaphylaxis, Swelling, and Other (See Comments) 10/02/2011   Chlorhexidine Hives 08/17/2021   Doxycycline Itching, Nausea And Vomiting, and Other (See Comments) 11/21/2017   Olanzapine Other (See Comments) 10/02/2011   Other Other (See Comments) 11/21/2017   Prozac [fluoxetine hcl] Other  (See Comments) 10/02/2011   Tape Other (See Comments) 07/05/2021   Triple antibiotic pain relief [neomy-bacit-polymyx-pramoxine] Hives 03/07/2021   Reglan [metoclopramide] Anxiety and Other (See Comments) 06/23/2020    Past Medical History:  Diagnosis Date   ADHD (attention deficit hyperactivity disorder)    Anemia    Anxiety    Asthma    Bipolar disorder ([place])    Complication of anesthesia    slow to wake up    Delayed gastric emptying 02/23/2020   Depression    DUB (dysfunctional uterine bleeding) 02/23/2020   Endometriosis    GERD (gastroesophageal reflux disease)    Heart murmur    Hiatal hernia 04/06/2020   Myalgia 04/06/2020   Parasitic infection 02/23/2020   Pneumonia    Poor appetite 02/23/2020   RUQ pain 02/23/2020   Vitamin D deficiency    Weight loss 02/23/2020    Past Surgical History:  Procedure Laterality Date   barium study     COLONOSCOPY     CYSTOSCOPY N/A 07/12/2021   Procedure: CYSTOSCOPY;  Surgeon: [name], MD;  Location: [place] [facility];  Service: Gynecology;  Laterality: N/A;   ESOPHAGOGASTRODUODENOSCOPY  2011   and a  ph study as well.    LAPAROSCOPY N/A 01/26/2021   Procedure: LAPAROSCOPY DIAGNOSTIC;  Surgeon: [name], MD;  Location: [facility];  Service: Gynecology;  Laterality: N/A;   NASAL SEPTUM SURGERY     TONGUE FLAP     TOTAL LAPAROSCOPIC HYSTERECTOMY WITH SALPINGECTOMY Bilateral 07/12/2021   Procedure: TOTAL LAPAROSCOPIC HYSTERECTOMY WITH SALPINGECTOMY, OOPHORECTOMY;  Surgeon: [name], MD;  Location: [facility];  Service: Gynecology;  Laterality: Bilateral;   UPPER GASTROINTESTINAL ENDOSCOPY     WISDOM TOOTH EXTRACTION      Family History  Problem Relation Age of Onset   Colon polyps Mother    Depression Mother    Anxiety disorder Mother    Hypertension Mother    Hyperlipidemia Mother    Asthma Mother    Endometriosis Mother    Fibroids Mother    Fibromyalgia Mother    Migraines Mother    Irritable  bowel syndrome Mother    Depression Father    Hypertension Father    Hyperlipidemia Father    Bipolar disorder Father    Bipolar disorder Sister    ADD / ADHD Sister    Asthma Sister    Endometriosis Sister    Seizures Sister    Bipolar disorder Sister    Anxiety disorder Sister    Heart disease Maternal Grandmother    Endometriosis Maternal Grandmother    Fibroids Maternal Grandmother    Hyperlipidemia Maternal Grandmother    Hypertension Maternal Grandmother    Diabetes Maternal Grandmother    Fibromyalgia Maternal Grandmother    Heart disease Maternal Grandfather    Diabetes Maternal Grandfather    Heart disease Paternal Grandmother    Heart disease Paternal Grandfather    Colon cancer Maternal Great-grandmother    Pancreatic cancer Neg Hx    Esophageal cancer Neg Hx    Stomach cancer Neg Hx     Social History   Socioeconomic History   Marital status: Divorced    Spouse name: Not on file   Number of children: Not on file   Years of education: Not on file   Highest education level: Not on file  Occupational History   Not on file  Tobacco Use   Smoking status: Never   Smokeless tobacco: Never  Vaping Use   Vaping Use: Never used  Substance and Sexual Activity   Alcohol use: Yes    Alcohol/week: 1.0 standard drink    Types: 1 Glasses of wine per week    Comment: 1 glass of wine per week   Drug use: No   Sexual activity: Not Currently  Other Topics Concern   Not on file  Social History Narrative   Not on file   Social Determinants of Health   Financial Resource Strain: Not on file  Food Insecurity: No Food Insecurity   Worried About Running Out of Food in the Last Year: Never true   Ran Out of Food in the Last Year: Never true  Transportation Needs: No Transportation Needs   Lack of Transportation (Medical): No   Lack of Transportation (Non-Medical): No  Physical Activity: Not on file  Stress: Not on file  Social Connections: Not on file  Intimate  Partner Violence: Not on file      Review of systems: All other review of systems negative except as mentioned in the HPI.   Physical Exam: Vitals:   01/03/22 0810  Pulse: 70   Body mass index is 17.67 kg/m. Gen:  No acute distress HEENT:  sclera anicteric Abd:      soft, non-tender; no palpable masses, no distension Ext:    No edema Neuro: alert and oriented x 3 Psych: normal mood and affect  Data Reviewed:  Reviewed labs, radiology imaging, old records and pertinent past GI work up   Assessment and Plan/Recommendations:  36 year old very pleasant female with history of chronic pelvic pain, endometriosis s/p TAH/BSO, s/p cholecystectomy, bipolar disorder, ADD/ADHD, depression, anxiety here for follow-up visit for chronic idiopathic constipation, abdominal bloating and discomfort    Chronic idiopathic constipation: Discontinue Trulance 3 mg daily  Patient was given samples of Motegrity 2 mg daily, advised patient to start using Motegrity 1 capsule, 2 mg daily and if does not have a bowel movement in 2 to 3 days add Linzess 290 mcg daily. We will need to titrate and adjust the dose as needed based on response.  Ok to use additional daily MiraLAX and senna as needed to have bowel movement on average at least every 2 to 3 days Increase daily water intake    Dyssynergic defecation: We will resend the referral  to pelvic floor physical therapy for biofeedback Patient was given contact information to call physical therapy if she does not hear from them   Post prandial bloating and discomfort: Likely multifactorial 4-hour gastric emptying study showed no significant delay Continue with small frequent meals Pured diet/soft foods as tolerated High-calorie high-protein diet  Return in 3 months or sooner if needed  This visit required 40 minutes of patient care (this includes precharting, chart review, review of results, face-to-face time used for counseling as well as  treatment plan and follow-up. The patient was provided an opportunity to ask questions and all were answered. The patient agreed with the plan and demonstrated an understanding of the instructions.  Damaris Hippo , MD    CC: Charlott Rakes, MD

## 2022-01-03 NOTE — Patient Instructions (Addendum)
We have put in a referral to Pelvic Floor Therapy again, If they do not contact you within a week, please contact our office at [phone]  Use Motegrity 2 mg daily, If no BM in 2-3 days add Linzess 258mcg daily, if you develop diarrhea use only Linzess daily and add Motegrity every 2-3 days   Follow up in 3 months  If you are age 35 or older, your body mass index should be between 23-30. Your Body mass index is 17.67 kg/m. If this is out of the aforementioned range listed, please consider follow up with your Primary Care Provider.  If you are age 17 or younger, your body mass index should be between 19-25. Your Body mass index is 17.67 kg/m. If this is out of the aformentioned range listed, please consider follow up with your Primary Care Provider.   ________________________________________________________  The [facility] providers would like to encourage you to use [facility] to communicate with providers for non-urgent requests or questions.  Due to long hold times on the telephone, sending your provider a message by [facility] may be a faster and more efficient way to get a response.  Please allow 48 business hours for a response.  Please remember that this is for non-urgent requests.  _______________________________________________________   I appreciate the  opportunity to care for you  Thank You   Harl Bowie , MD

## 2022-01-05 ENCOUNTER — Telehealth: Payer: Self-pay | Admitting: Gastroenterology

## 2022-01-05 NOTE — Telephone Encounter (Signed)
Inbound call from patient requesting to speak with a nurse please.  States she having a reaction to Motegrity medication.

## 2022-01-05 NOTE — Telephone Encounter (Signed)
Patient took [place] for the first time yesterday. She reports that 4 hours later she could not communicate, felt numb, dizzy, "beyond nausea" and very disconnected. She reports her parents had her lie down in a dark room and "sleep it off." Her mother is an ER nurse and she felt comfortable keeping her home.. Today she is better but still feels "off and a little nauseated."  She wants to know if that was an allergic reaction to [place].

## 2022-01-06 NOTE — BH Specialist Note (Deleted)
Integrated Behavioral Health via Telemedicine Visit ? ?01/06/2022 ?Rachel [name] ?[id number] ? ?Number of [place] Clinician visits: Additional Visit ? ?Session Start time: 3361 ?  ?Session End time: 2244 ? ?Total time in minutes: 60 ? ? ?Referring Provider: *** ?Patient/Family location: *** ?[facility] Provider location: *** ?All persons participating in visit: *** ?Types of Service: {CHL AMB TYPE OF [phone]} ? ?I connected with Rachel [name] [name] {family members:20773} via  Telephone or Geologist, engineering  (Video is Tree surgeon) and verified that I am speaking with the correct person using two identifiers. Discussed confidentiality: {YES/NO:21197} ? ?I discussed the limitations of telemedicine and the availability of in person appointments.  Discussed there is a possibility of technology failure and discussed alternative modes of communication if that failure occurs. ? ?I discussed that engaging in this telemedicine visit, they consent to the provision of behavioral healthcare and the services will be billed under their insurance. ? ?Patient and/or legal guardian expressed understanding and consented to Telemedicine visit: {YES/NO:21197} ? ?Presenting Concerns: ?Patient and/or family reports the following symptoms/concerns: *** ?Duration of problem: ***; Severity of problem: {Mild/Moderate/Severe:20260} ? ?Patient and/or Family's Strengths/Protective Factors: ?{CHL AMB BH PROTECTIVE [phone]} ? ?Goals Addressed: ?Patient will: ? Reduce symptoms of: {IBH Symptoms:21014056}  ? Increase knowledge and/or ability of: {IBH Patient Tools:21014057}  ? Demonstrate ability to: {IBH Goals:21014053} ? ?Progress towards Goals: ?{CHL AMB BH PROGRESS TOWARDS LPNPY:0511021117} ? ?Interventions: ?Interventions utilized:  {IBH Interventions:21014054} ?Standardized Assessments completed: {IBH Screening Tools:21014051} ? ?Patient  and/or Family Response: *** ? ?Assessment: ?Patient currently experiencing ***.  ? ?Patient may benefit from ***. ? ?[name]: ?Follow up with behavioral health clinician on : *** ?Behavioral recommendations: *** ?Referral(s): {IBH Referrals:21014055} ? ?I discussed the assessment and treatment [name] with the patient and/or parent/guardian. They were provided an opportunity to ask questions and all were answered. They agreed with the [name] and demonstrated an understanding of the instructions. ?  ?They were advised to call back or seek an in-person evaluation if the symptoms worsen or if the condition fails to improve as anticipated. ? ?[name], LCSW ?

## 2022-01-06 NOTE — Telephone Encounter (Signed)
Left message for the patient to avoid taking Motegrity. No new recommendations.

## 2022-01-11 ENCOUNTER — Other Ambulatory Visit: Payer: Self-pay

## 2022-01-11 ENCOUNTER — Encounter: Payer: Self-pay | Admitting: Obstetrics and Gynecology

## 2022-01-11 ENCOUNTER — Ambulatory Visit: Payer: Medicaid Other | Admitting: Obstetrics and Gynecology

## 2022-01-11 VITALS — BP 119/82 | HR 77 | Wt 99.8 lb

## 2022-01-11 DIAGNOSIS — N951 Menopausal and female climacteric states: Secondary | ICD-10-CM

## 2022-01-11 DIAGNOSIS — Z90710 Acquired absence of both cervix and uterus: Secondary | ICD-10-CM | POA: Diagnosis not present

## 2022-01-11 NOTE — Progress Notes (Signed)
?  CC: HRT follow up ?Subjective:  ? ? Patient ID: Rachel [name], female    DOB: [birth year], 36 y.o.   MRN: [id number] ? ?HPI ?Pt seen as gyn follow up.  She states she has been doing very well since her hysterectomy and feels much better.  She has adequate coverage of her vasomotor symptoms with current dosage of premarin 0.45.  She notes occasional vaginal dryness and may need to use topical estrogen in the future.  She notes she has been able to accomplish intimacy and enjoys life much more. ? ? ?Review of Systems ? ?   ?Objective:  ? Physical Exam ?Vitals:  ? 01/11/22 1536  ?BP: 119/82  ?Pulse: 77  ? ? ? ? ?   ?Assessment & Plan:  ? ?1. S/P laparoscopic hysterectomy ? ? ?2. Vasomotor symptoms due to menopause ?Continue premarin at current dosage, may add estrogen vaginal cream if needed. ?F/u 11/23 for Annual exam ? ?I spent 10 minutes dedicated to the care of this patient including previsit review of records, face to face time with the patient discussing current care and post visit testing.  ? ?[name], MD ?Faculty Attending, Center for [facility]  ?

## 2022-01-17 ENCOUNTER — Other Ambulatory Visit: Payer: Self-pay

## 2022-01-17 ENCOUNTER — Emergency Department
Admission: EM | Admit: 2022-01-17 | Discharge: 2022-01-17 | Disposition: A | Discharge status: Home / Self Care | Payer: Medicaid Other | Source: Home / Self Care

## 2022-01-17 ENCOUNTER — Encounter (HOSPITAL_COMMUNITY): Payer: Self-pay

## 2022-01-17 ENCOUNTER — Inpatient Hospital Stay (HOSPITAL_COMMUNITY)
Admission: EM | Admit: 2022-01-17 | Discharge: 2022-01-21 | DRG: 871 | Disposition: A | Discharge status: Home / Self Care | Payer: Medicaid Other | Attending: Internal Medicine | Admitting: Internal Medicine

## 2022-01-17 ENCOUNTER — Emergency Department (HOSPITAL_COMMUNITY): Payer: Medicaid Other

## 2022-01-17 ENCOUNTER — Emergency Department (INDEPENDENT_AMBULATORY_CARE_PROVIDER_SITE_OTHER): Payer: Medicaid Other

## 2022-01-17 DIAGNOSIS — R509 Fever, unspecified: Secondary | ICD-10-CM | POA: Diagnosis not present

## 2022-01-17 DIAGNOSIS — Z681 Body mass index (BMI) 19 or less, adult: Secondary | ICD-10-CM

## 2022-01-17 DIAGNOSIS — E43 Unspecified severe protein-calorie malnutrition: Secondary | ICD-10-CM | POA: Insufficient documentation

## 2022-01-17 DIAGNOSIS — Z880 Allergy status to penicillin: Secondary | ICD-10-CM

## 2022-01-17 DIAGNOSIS — R636 Underweight: Secondary | ICD-10-CM

## 2022-01-17 DIAGNOSIS — N10 Acute tubulo-interstitial nephritis: Secondary | ICD-10-CM | POA: Diagnosis present

## 2022-01-17 DIAGNOSIS — A419 Sepsis, unspecified organism: Secondary | ICD-10-CM | POA: Diagnosis not present

## 2022-01-17 DIAGNOSIS — R110 Nausea: Secondary | ICD-10-CM

## 2022-01-17 DIAGNOSIS — Z79899 Other long term (current) drug therapy: Secondary | ICD-10-CM

## 2022-01-17 DIAGNOSIS — Z825 Family history of asthma and other chronic lower respiratory diseases: Secondary | ICD-10-CM

## 2022-01-17 DIAGNOSIS — D509 Iron deficiency anemia, unspecified: Secondary | ICD-10-CM

## 2022-01-17 DIAGNOSIS — G8929 Other chronic pain: Secondary | ICD-10-CM | POA: Diagnosis not present

## 2022-01-17 DIAGNOSIS — Z20822 Contact with and (suspected) exposure to covid-19: Secondary | ICD-10-CM | POA: Diagnosis present

## 2022-01-17 DIAGNOSIS — Z8249 Family history of ischemic heart disease and other diseases of the circulatory system: Secondary | ICD-10-CM

## 2022-01-17 DIAGNOSIS — E861 Hypovolemia: Secondary | ICD-10-CM | POA: Diagnosis present

## 2022-01-17 DIAGNOSIS — E876 Hypokalemia: Secondary | ICD-10-CM

## 2022-01-17 DIAGNOSIS — K30 Functional dyspepsia: Secondary | ICD-10-CM

## 2022-01-17 DIAGNOSIS — E559 Vitamin D deficiency, unspecified: Secondary | ICD-10-CM | POA: Diagnosis present

## 2022-01-17 DIAGNOSIS — N12 Tubulo-interstitial nephritis, not specified as acute or chronic: Secondary | ICD-10-CM | POA: Diagnosis not present

## 2022-01-17 DIAGNOSIS — Z888 Allergy status to other drugs, medicaments and biological substances status: Secondary | ICD-10-CM

## 2022-01-17 DIAGNOSIS — F909 Attention-deficit hyperactivity disorder, unspecified type: Secondary | ICD-10-CM | POA: Diagnosis present

## 2022-01-17 DIAGNOSIS — N951 Menopausal and female climacteric states: Secondary | ICD-10-CM | POA: Diagnosis not present

## 2022-01-17 DIAGNOSIS — Z83438 Family history of other disorder of lipoprotein metabolism and other lipidemia: Secondary | ICD-10-CM

## 2022-01-17 DIAGNOSIS — R109 Unspecified abdominal pain: Secondary | ICD-10-CM | POA: Diagnosis not present

## 2022-01-17 DIAGNOSIS — N809 Endometriosis, unspecified: Secondary | ICD-10-CM | POA: Diagnosis present

## 2022-01-17 DIAGNOSIS — K219 Gastro-esophageal reflux disease without esophagitis: Secondary | ICD-10-CM | POA: Diagnosis present

## 2022-01-17 DIAGNOSIS — R059 Cough, unspecified: Secondary | ICD-10-CM

## 2022-01-17 DIAGNOSIS — Z8269 Family history of other diseases of the musculoskeletal system and connective tissue: Secondary | ICD-10-CM

## 2022-01-17 DIAGNOSIS — R000 Tachycardia, unspecified: Secondary | ICD-10-CM | POA: Diagnosis not present

## 2022-01-17 DIAGNOSIS — N2889 Other specified disorders of kidney and ureter: Secondary | ICD-10-CM | POA: Diagnosis not present

## 2022-01-17 DIAGNOSIS — R197 Diarrhea, unspecified: Secondary | ICD-10-CM | POA: Diagnosis not present

## 2022-01-17 DIAGNOSIS — E871 Hypo-osmolality and hyponatremia: Secondary | ICD-10-CM

## 2022-01-17 DIAGNOSIS — F319 Bipolar disorder, unspecified: Secondary | ICD-10-CM | POA: Diagnosis present

## 2022-01-17 DIAGNOSIS — F419 Anxiety disorder, unspecified: Secondary | ICD-10-CM | POA: Diagnosis present

## 2022-01-17 DIAGNOSIS — K582 Mixed irritable bowel syndrome: Secondary | ICD-10-CM | POA: Diagnosis present

## 2022-01-17 DIAGNOSIS — Z833 Family history of diabetes mellitus: Secondary | ICD-10-CM

## 2022-01-17 DIAGNOSIS — R0600 Dyspnea, unspecified: Secondary | ICD-10-CM

## 2022-01-17 DIAGNOSIS — Z881 Allergy status to other antibiotic agents status: Secondary | ICD-10-CM

## 2022-01-17 DIAGNOSIS — Z818 Family history of other mental and behavioral disorders: Secondary | ICD-10-CM

## 2022-01-17 DIAGNOSIS — Z8371 Family history of colonic polyps: Secondary | ICD-10-CM

## 2022-01-17 DIAGNOSIS — J45909 Unspecified asthma, uncomplicated: Secondary | ICD-10-CM | POA: Diagnosis present

## 2022-01-17 DIAGNOSIS — K589 Irritable bowel syndrome without diarrhea: Secondary | ICD-10-CM | POA: Insufficient documentation

## 2022-01-17 DIAGNOSIS — Z90710 Acquired absence of both cervix and uterus: Secondary | ICD-10-CM

## 2022-01-17 DIAGNOSIS — Z800 Family history of malignant neoplasm of digestive organs: Secondary | ICD-10-CM

## 2022-01-17 DIAGNOSIS — K3184 Gastroparesis: Secondary | ICD-10-CM | POA: Diagnosis present

## 2022-01-17 LAB — CBC WITH DIFFERENTIAL/PLATELET
Abs Immature Granulocytes: 0.03 10*3/uL (ref 0.00–0.07)
Basophils Absolute: 0 10*3/uL (ref 0.0–0.1)
Basophils Relative: 0 %
Eosinophils Absolute: 0 10*3/uL (ref 0.0–0.5)
Eosinophils Relative: 0 %
HCT: 36.3 % (ref 36.0–46.0)
Hemoglobin: 12.1 g/dL (ref 12.0–15.0)
Immature Granulocytes: 0 %
Lymphocytes Relative: 7 %
Lymphs Abs: 1.1 10*3/uL (ref 0.7–4.0)
MCH: 30.6 pg (ref 26.0–34.0)
MCHC: 33.3 g/dL (ref 30.0–36.0)
MCV: 91.9 fL (ref 80.0–100.0)
Monocytes Absolute: 1.8 10*3/uL — ABNORMAL HIGH (ref 0.1–1.0)
Monocytes Relative: 12 %
Neutro Abs: 11.8 10*3/uL — ABNORMAL HIGH (ref 1.7–7.7)
Neutrophils Relative %: 81 %
Platelets: 204 10*3/uL (ref 150–400)
RBC: 3.95 MIL/uL (ref 3.87–5.11)
RDW: 14.7 % (ref 11.5–15.5)
WBC: 14.6 10*3/uL — ABNORMAL HIGH (ref 4.0–10.5)
nRBC: 0 % (ref 0.0–0.2)

## 2022-01-17 LAB — URINALYSIS, ROUTINE W REFLEX MICROSCOPIC
Bilirubin Urine: NEGATIVE
Glucose, UA: NEGATIVE mg/dL
Hgb urine dipstick: NEGATIVE
Ketones, ur: NEGATIVE mg/dL
Nitrite: POSITIVE — AB
Protein, ur: NEGATIVE mg/dL
Specific Gravity, Urine: 1.038 — ABNORMAL HIGH (ref 1.005–1.030)
WBC, UA: >50 WBC/hpf — ABNORMAL HIGH (ref 0–5)
pH: 6 (ref 5.0–8.0)

## 2022-01-17 LAB — COMPREHENSIVE METABOLIC PANEL
ALT: 10 U/L (ref 0–44)
AST: 14 U/L — ABNORMAL LOW (ref 15–41)
Albumin: 3.7 g/dL (ref 3.5–5.0)
Alkaline Phosphatase: 52 U/L (ref 38–126)
Anion gap: 6 (ref 5–15)
BUN: 13 mg/dL (ref 6–20)
CO2: 26 mmol/L (ref 22–32)
Calcium: 8.7 mg/dL — ABNORMAL LOW (ref 8.9–10.3)
Chloride: 101 mmol/L (ref 98–111)
Creatinine, Ser: 0.72 mg/dL (ref 0.44–1.00)
GFR, Estimated: >60 mL/min (ref >60)
Glucose, Bld: 107 mg/dL — ABNORMAL HIGH (ref 70–99)
Potassium: 3.1 mmol/L — ABNORMAL LOW (ref 3.5–5.1)
Sodium: 133 mmol/L — ABNORMAL LOW (ref 135–145)
Total Bilirubin: 0.5 mg/dL (ref 0.3–1.2)
Total Protein: 7.3 g/dL (ref 6.5–8.1)

## 2022-01-17 LAB — HIV ANTIBODY (ROUTINE TESTING W REFLEX): HIV Screen 4th Generation wRfx: NONREACTIVE

## 2022-01-17 LAB — POC SARS CORONAVIRUS 2 AG -  ED: SARS Coronavirus 2 Ag: NEGATIVE

## 2022-01-17 LAB — POC INFLUENZA A AND B ANTIGEN (URGENT CARE ONLY)
Influenza A Ag: NEGATIVE
Influenza B Ag: NEGATIVE

## 2022-01-17 LAB — LIPASE, BLOOD: Lipase: 32 U/L (ref 11–51)

## 2022-01-17 LAB — LACTIC ACID, PLASMA: Lactic Acid, Venous: 0.7 mmol/L (ref 0.5–1.9)

## 2022-01-17 LAB — D-DIMER, QUANTITATIVE: D-Dimer, Quant: 0.33 ug/mL-FEU (ref 0.00–0.50)

## 2022-01-17 MED ORDER — VITAMIN D 25 MCG (1000 UNIT) PO TABS
2000.0000 [IU] | ORAL_TABLET | Freq: Every day | ORAL | Status: DC
  Administered 2022-01-17 – 2022-01-21 (×5): 2000 [IU] via ORAL
  Filled 2022-01-17 (×5): qty 2

## 2022-01-17 MED ORDER — IOHEXOL 300 MG/ML  SOLN
80.0000 mL | Freq: Once | INTRAMUSCULAR | Status: AC | PRN
  Administered 2022-01-17: 80 mL via INTRAVENOUS

## 2022-01-17 MED ORDER — LINACLOTIDE 145 MCG PO CAPS
290.0000 ug | ORAL_CAPSULE | Freq: Every day | ORAL | Status: DC
  Administered 2022-01-18 – 2022-01-21 (×4): 290 ug via ORAL
  Filled 2022-01-17 (×4): qty 2

## 2022-01-17 MED ORDER — ESTROGENS CONJUGATED 0.45 MG PO TABS
0.4500 mg | ORAL_TABLET | Freq: Every day | ORAL | Status: DC
  Administered 2022-01-17 – 2022-01-18 (×2): 0.45 mg via ORAL
  Filled 2022-01-17 (×5): qty 1

## 2022-01-17 MED ORDER — LACTATED RINGERS IV SOLN: INTRAVENOUS | Status: DC

## 2022-01-17 MED ORDER — SENNOSIDES-DOCUSATE SODIUM 8.6-50 MG PO TABS
2.0000 | ORAL_TABLET | Freq: Every day | ORAL | Status: DC
  Administered 2022-01-17 – 2022-01-21 (×5): 2 via ORAL
  Filled 2022-01-17 (×5): qty 2

## 2022-01-17 MED ORDER — ACETAMINOPHEN 325 MG PO TABS
650.0000 mg | ORAL_TABLET | Freq: Four times a day (QID) | ORAL | Status: DC | PRN
  Administered 2022-01-17 – 2022-01-19 (×4): 650 mg via ORAL
  Filled 2022-01-17 (×5): qty 2

## 2022-01-17 MED ORDER — ALBUTEROL SULFATE (2.5 MG/3ML) 0.083% IN NEBU: 2.5000 mg | INHALATION_SOLUTION | RESPIRATORY_TRACT | Status: DC | PRN

## 2022-01-17 MED ORDER — DROPERIDOL 2.5 MG/ML IJ SOLN
1.2500 mg | Freq: Once | INTRAMUSCULAR | Status: DC
Start: 2022-01-17 — End: 2022-01-17

## 2022-01-17 MED ORDER — ENSURE ENLIVE PO LIQD
237.0000 mL | Freq: Two times a day (BID) | ORAL | Status: DC
  Administered 2022-01-18 (×2): 237 mL via ORAL

## 2022-01-17 MED ORDER — IBUPROFEN 200 MG PO TABS: 600.0000 mg | ORAL_TABLET | Freq: Four times a day (QID) | ORAL | Status: DC | PRN

## 2022-01-17 MED ORDER — MELATONIN 5 MG PO TABS
10.0000 mg | ORAL_TABLET | Freq: Every day | ORAL | Status: DC
  Administered 2022-01-17 – 2022-01-20 (×3): 10 mg via ORAL
  Filled 2022-01-17 (×3): qty 2

## 2022-01-17 MED ORDER — POLYETHYLENE GLYCOL 3350 17 G PO PACK
17.0000 g | PACK | Freq: Every day | ORAL | Status: DC
  Administered 2022-01-18 – 2022-01-21 (×4): 17 g via ORAL
  Filled 2022-01-17 (×5): qty 1

## 2022-01-17 MED ORDER — CIPROFLOXACIN IN D5W 400 MG/200ML IV SOLN
400.0000 mg | Freq: Two times a day (BID) | INTRAVENOUS | Status: DC
  Administered 2022-01-18: 400 mg via INTRAVENOUS
  Filled 2022-01-17: qty 200

## 2022-01-17 MED ORDER — HYDROCODONE-ACETAMINOPHEN 5-325 MG PO TABS
1.0000 | ORAL_TABLET | ORAL | Status: DC | PRN
  Administered 2022-01-17: 1 via ORAL
  Administered 2022-01-17 – 2022-01-19 (×6): 2 via ORAL
  Filled 2022-01-17: qty 1
  Filled 2022-01-17 (×4): qty 2
  Filled 2022-01-17 (×2): qty 1
  Filled 2022-01-17: qty 2

## 2022-01-17 MED ORDER — ASCORBIC ACID 500 MG PO TABS
1000.0000 mg | ORAL_TABLET | Freq: Two times a day (BID) | ORAL | Status: DC
  Administered 2022-01-17 – 2022-01-21 (×8): 1000 mg via ORAL
  Filled 2022-01-17 (×8): qty 2

## 2022-01-17 MED ORDER — CIPROFLOXACIN IN D5W 400 MG/200ML IV SOLN
400.0000 mg | Freq: Once | INTRAVENOUS | Status: AC
  Administered 2022-01-17: 400 mg via INTRAVENOUS
  Filled 2022-01-17: qty 200

## 2022-01-17 MED ORDER — ENOXAPARIN SODIUM 40 MG/0.4ML IJ SOSY
40.0000 mg | PREFILLED_SYRINGE | INTRAMUSCULAR | Status: DC
  Administered 2022-01-17 – 2022-01-20 (×4): 40 mg via SUBCUTANEOUS
  Filled 2022-01-17 (×4): qty 0.4

## 2022-01-17 MED ORDER — LACTATED RINGERS IV BOLUS
1000.0000 mL | Freq: Once | INTRAVENOUS | Status: AC
  Administered 2022-01-17: 1000 mL via INTRAVENOUS

## 2022-01-17 MED ORDER — LORATADINE 10 MG PO TABS
10.0000 mg | ORAL_TABLET | Freq: Every day | ORAL | Status: DC
  Administered 2022-01-17 – 2022-01-21 (×5): 10 mg via ORAL
  Filled 2022-01-17 (×5): qty 1

## 2022-01-17 MED ORDER — FENTANYL CITRATE PF 50 MCG/ML IJ SOSY
25.0000 ug | PREFILLED_SYRINGE | Freq: Once | INTRAMUSCULAR | Status: AC
  Administered 2022-01-17: 25 ug via INTRAVENOUS
  Filled 2022-01-17: qty 1

## 2022-01-17 MED ORDER — POTASSIUM CHLORIDE CRYS ER 20 MEQ PO TBCR
40.0000 meq | EXTENDED_RELEASE_TABLET | ORAL | Status: AC
  Administered 2022-01-17 (×2): 40 meq via ORAL
  Filled 2022-01-17 (×2): qty 2

## 2022-01-17 NOTE — Assessment & Plan Note (Addendum)
Tmax of 101.1, CT scan showed left pyelonephritis. ?We will transition her to IV cefepime. ?Check a CBC with differential.  Recheck blood cultures ?Check a lower extremity Doppler to rule out DVT. ?CBC with differential is pending this morning. ?HIV is negative SARS-CoV-2  & influenza PCR negative ?

## 2022-01-17 NOTE — Assessment & Plan Note (Addendum)
Alternating constipation and diarrhea. ?Continue home Linzess, MiraLAX and Senokot-S ?

## 2022-01-17 NOTE — Progress Notes (Signed)
?   01/17/22 1548  ?Assess: MEWS Score  ?Temp (!) 103.2 ?F (39.6 ?C)  ?BP 120/87  ?Pulse Rate (!) 105  ?Resp 20  ?Level of Consciousness Alert  ?SpO2 98 %  ?O2 Device Room Air  ?Assess: MEWS Score  ?MEWS Temp 2  ?MEWS Systolic 0  ?MEWS Pulse 1  ?MEWS RR 0  ?MEWS LOC 0  ?MEWS Score 3  ?MEWS Score Color Yellow  ?Assess: if the MEWS score is Yellow or Red  ?Were vital signs taken at a resting state? Yes  ?Focused Assessment  ?(yellow muse on admission to the floor from the ER)  ?Does the patient meet 2 or more of the SIRS criteria? Yes  ?Does the patient have a confirmed or suspected source of infection? No  ?Treat  ?MEWS Interventions Administered prn meds/treatments  ?Take Vital Signs  ?Increase Vital Sign Frequency  Yellow: Q 2hr X 2 then Q 4hr X 2, if remains yellow, continue Q 4hrs  ?Notify: Charge Nurse/RN  ?Name of Charge Nurse/RN Notified Brien Mates RN  ?Date Charge Nurse/RN Notified 01/17/22  ?Time Charge Nurse/RN Notified 1550  ?Assess: SIRS CRITERIA  ?SIRS Temperature  1  ?SIRS Pulse 1  ?SIRS Respirations  0  ?SIRS WBC 1  ?SIRS Score Sum  3  ? ? ?

## 2022-01-17 NOTE — ED Triage Notes (Signed)
Pt here c/o cough, fever and sore throat since Sunday. Worsening yesterday since 3pm. Had flu and pnuemonia last month. Tx with zpak. Tylenol at 730 this am.  ?

## 2022-01-17 NOTE — Progress Notes (Signed)
?   01/17/22 1613  ?Notify: Provider  ?Provider Name/Title Dr. Lonny Prude  ?Date Provider Notified 01/17/22  ?Time Provider Notified [phone]  ?Notification Type Page  ?Notification Reason Other (Comment) ?(Temperature and Yellow Muse)  ?Provider response See new orders  ?Date of Provider Response 01/17/22  ?Time of Provider Response 1613  ? ? ?

## 2022-01-17 NOTE — Assessment & Plan Note (Addendum)
Patient confirms current abdominal pain is chronic. ?-Continue ibuprofen PRN ?-Norco PRN for severe pain ?

## 2022-01-17 NOTE — H&P (Signed)
History and Physical    Patient: Rachel [name] HUD:[id number] DOB: [birth year] DOA: 01/17/2022 DOS: the patient was seen and examined on 01/17/2022 PCP: Charlott Rakes, MD  Patient coming from: Home  Chief Complaint:  Chief Complaint  Patient presents with   Fever   Cough   Shortness of Breath   Chest Pain   HPI: Rachel [name] is a 36 y.o. female with medical history significant of bipolar disorder, anxiety, asthma, GERD, endometriosis/dysfunctional uterine bleeding s/p hysterectomy with bilateral salpingo-oophorectomy. Patient reports a temperature of 104.8 last night with weakness, dyspnea, fever this morning prompting a visit to urgent care. She states she was sent from urgent care to the ED for concern for elevated diaphragm. She reports nausea but no emesis, chills, chest pain, abdominal pain, dyspnea.  Review of Systems: As mentioned in the history of present illness. All other systems reviewed and are negative. Past Medical History:  Diagnosis Date   ADHD (attention deficit hyperactivity disorder)    Anemia    Anxiety    Asthma    Bipolar disorder ([place])    Complication of anesthesia    slow to wake up    Delayed gastric emptying 02/23/2020   Depression    DUB (dysfunctional uterine bleeding) 02/23/2020   Endometriosis    GERD (gastroesophageal reflux disease)    Heart murmur    Hiatal hernia 04/06/2020   Myalgia 04/06/2020   Parasitic infection 02/23/2020   Pneumonia    Poor appetite 02/23/2020   RUQ pain 02/23/2020   Vitamin D deficiency    Weight loss 02/23/2020   Past Surgical History:  Procedure Laterality Date   barium study     COLONOSCOPY     CYSTOSCOPY N/A 07/12/2021   Procedure: CYSTOSCOPY;  Surgeon: [name], MD;  Location: [place];  Service: Gynecology;  Laterality: N/A;   ESOPHAGOGASTRODUODENOSCOPY  2011   and a ph study as well.    LAPAROSCOPY N/A 01/26/2021   Procedure: LAPAROSCOPY DIAGNOSTIC;  Surgeon: [name], MD;   Location: [place];  Service: Gynecology;  Laterality: N/A;   NASAL SEPTUM SURGERY     TONGUE FLAP     TOTAL LAPAROSCOPIC HYSTERECTOMY WITH SALPINGECTOMY Bilateral 07/12/2021   Procedure: TOTAL LAPAROSCOPIC HYSTERECTOMY WITH SALPINGECTOMY, OOPHORECTOMY;  Surgeon: [name], MD;  Location: [place];  Service: Gynecology;  Laterality: Bilateral;   UPPER GASTROINTESTINAL ENDOSCOPY     WISDOM TOOTH EXTRACTION     Social History:  reports that she has never smoked. She has never used smokeless tobacco. She reports current alcohol use of about 1.0 standard drink per week. She reports that she does not use drugs.  Allergies  Allergen Reactions   Amphetamine-Dextroamphetamine Other (See Comments)    Triggered bipolar, caused depression, and suicidal thoughts   Lupron [Leuprolide] Anaphylaxis   Orilissa [Elagolix] Anaphylaxis   Penicillins Anaphylaxis, Swelling and Other (See Comments)    Potential for anaphylaxis confirmed No "-cillins"!!!! Did it involve swelling of the face/tongue/throat, SOB, or low BP? Yes Did it involve sudden or severe rash/hives, skin peeling, or any reaction on the inside of your mouth or nose? Unknown Did you need to seek medical attention at a hospital or doctor's office? Unknown When did it last happen? teenager      If all above answers are "NO", may proceed with cephalosporin use.   Chlorhexidine Hives    DuraPrep    Doxycycline Itching, Nausea And Vomiting and Other (See Comments)    Caused high fever, also  Olanzapine Other (See Comments)    Caused mania   Other Other (See Comments)    Stimulants, mood stabilizers, and antidepressants = Triggered bipolar, caused depression, and suicidal thoughts   Prozac [Fluoxetine Hcl] Other (See Comments)    Triggered bipolar, caused depression, and suicidal thoughts   Tape Other (See Comments)    Paper tape is tolerated   Triple Antibiotic Pain Relief [Neomy-Bacit-Polymyx-Pramoxine] Hives   Reglan  [Metoclopramide] Anxiety and Other (See Comments)    Triggered bipolar, caused depression, suicidal thoughts, muscle twitching, stiffness, and high anxiety     Family History  Problem Relation Age of Onset   Colon polyps Mother    Depression Mother    Anxiety disorder Mother    Hypertension Mother    Hyperlipidemia Mother    Asthma Mother    Endometriosis Mother    Fibroids Mother    Fibromyalgia Mother    Migraines Mother    Irritable bowel syndrome Mother    Depression Father    Hypertension Father    Hyperlipidemia Father    Bipolar disorder Father    Bipolar disorder Sister    ADD / ADHD Sister    Asthma Sister    Endometriosis Sister    Seizures Sister    Bipolar disorder Sister    Anxiety disorder Sister    Heart disease Maternal Grandmother    Endometriosis Maternal Grandmother    Fibroids Maternal Grandmother    Hyperlipidemia Maternal Grandmother    Hypertension Maternal Grandmother    Diabetes Maternal Grandmother    Fibromyalgia Maternal Grandmother    Heart disease Maternal Grandfather    Diabetes Maternal Grandfather    Heart disease Paternal Grandmother    Heart disease Paternal Grandfather    Colon cancer Maternal Great-grandmother    Pancreatic cancer Neg Hx    Esophageal cancer Neg Hx    Stomach cancer Neg Hx     Prior to Admission medications   Medication Sig Start Date End Date Taking? Authorizing Provider  albuterol (PROVENTIL) (2.5 MG/3ML) 0.083% nebulizer solution Take 3 mLs (2.5 mg total) by nebulization every 6 (six) hours as needed for wheezing or shortness of breath. Max 4 doses per day 12/16/21   Kandra Nicolas, MD  albuterol (VENTOLIN HFA) 108 (90 Base) MCG/ACT inhaler Inhale 1-2 puffs into the lungs every 6 (six) hours as needed for wheezing or shortness of breath. 12/16/21   Kandra Nicolas, MD  Ascorbic Acid (VITAMIN C) 1000 MG tablet Take 1,000 mg by mouth 2 (two) times daily.    [provider]  Calcium-Magnesium-Vitamin D  600-40-500 MG-MG-UNIT TB24 Take 3 tablets by mouth in the morning and at bedtime.    [provider]  Cholecalciferol (VITAMIN D3) 50 MCG (2000 UT) TABS Take 2,000 Units by mouth daily.    [provider]  EPINEPHrine 0.3 mg/0.3 mL IJ SOAJ injection Inject 0.3 mg into the muscle as needed for anaphylaxis. 05/27/21   Levin Bacon, MD  estrogens, conjugated, (PREMARIN) 0.45 MG tablet Take 1 tablet (0.45 mg total) by mouth daily. 08/17/21   [name], MD  fexofenadine (ALLEGRA) 180 MG tablet Take 180 mg by mouth daily.    [provider]  ibuprofen (ADVIL) 600 MG tablet Take 1 tablet (600 mg total) by mouth every 6 (six) hours as needed for headache, mild pain, moderate pain or cramping. 07/13/21   [name], MD  linaclotide [facility]) 290 MCG CAPS capsule Take 1 capsule (290 mcg total) by mouth  daily before breakfast. 01/03/22   Mauri Pole, MD  Melatonin 10 MG TABS Take 10 mg by mouth at bedtime.    [provider]  MIRALAX 17 GM/SCOOP powder Take 17 g by mouth See admin instructions. Mix 17 g of powder into water and drink once a day    [provider]  Multiple Vitamins-Minerals (ADULT ONE DAILY GUMMIES PO) Take 2 tablets by mouth daily.    [provider]  Omega-3 Fatty Acids (FISH OIL) 1200 MG CAPS Take 1,200 mg by mouth daily.    [provider]  ondansetron (ZOFRAN) 4 MG tablet Take 1 tablet (4 mg total) by mouth every 6 (six) hours as needed for nausea. 07/13/21   [name], MD  Plecanatide (TRULANCE) 3 MG TABS Take 1 capsule by mouth daily at 2 am. 10/28/21   Mauri Pole, MD  promethazine (PHENERGAN) 12.5 MG tablet Take 12.5 mg by mouth See admin instructions. Take 12.5 mg by mouth every 6-8 hours as needed for nausea    [provider]  sennosides-docusate sodium (SENOKOT-S) 8.6-50 MG tablet Take 1-2 tablets by mouth daily.    [provider]    Physical Exam: Vitals:    01/17/22 1140 01/17/22 1145 01/17/22 1215 01/17/22 1400  BP:  111/80 105/73 115/86  Pulse: 93 94 86 94  Resp: (!) 25 (!) 26 (!) 21 19  Temp:      TempSrc:      SpO2: 96% 96% 98% 100%  Weight:      Height:       General exam: Appears calm and comfortable and in no acute distress. Conversant Respiratory: Clear to auscultation. Respiratory effort normal with no intercostal retractions or use of accessory muscles Cardiovascular: S1 & S2 heard, RRR. No murmurs, rubs, gallops or clicks. No LE edema Gastrointestinal: Abdomen is non-distended, soft and generally tender with significant left flank tenderness. No masses felt. Normal bowel sounds heard Neurologic: No focal neurological deficits Musculoskeletal: No calf tenderness Skin: No cyanosis. No new rashes Psychiatry: Alert and oriented x4. Memory intact. Blunt affect  Data Reviewed:   BMP significant for: Sodium of 133 Potassium of 3.1  CBC significant for: WBC of 14,600  Lactic acid of 0.7  EKG significant for sinus tachycardia  Assessment and Plan: * Pyelonephritis of left kidney Patient with CT evidence of left pyelonephritis. Associated fevers and leukocytosis. No urinalysis/culture obtained prior to antibiotics in the ED. -Continue Ciprofloxacin  Sepsis ([place]) Secondary to pyelonephritis. Associated fever, leukocytosis and tachycardia. Tachycardia improved with pain control and IV fluids. Blood cultures ordered but appears only one set was obtained prior to antibiotics. As mentioned above, urine culture not obtained prior to antibiotics -Follow-up culture data -Antibiotics, see problem Pyelonephritis of left kidney  Underweight Body mass index is 17.96 kg/m.  -Dietitian consult as mentioned in problem, Delayed gastric emptying  Dyspnea Unsure of etiology. Low risk for PE. Associated non-specific chest pain. Possibly related to overall malaise. Negative chest x-ray. On room air. -Check d-dimer -Albuterol  PRN  Chronic abdominal pain Patient with a history of endometriosis. Patient confirms current abdominal pain is chronic. -Continue ibuprofen PRN -Norco PRN for severe pain  Delayed gastric emptying Patient reports a history of this problem, currently not on medications. Previous intolerance to Reglan. Patient has seen many specialists with no good plan for management. No current emesis. Patient states she sticks mainly to a liquid diet. BMI places patient in range of underweight -Dietitian consult -Regular diet as  tolerated  Irritable bowel syndrome (IBS) Alternating constipation and diarrhea. CT evidence of diarrheal state. Patient states her last bowel movement was 6 days prior to admission. She is on Linzess and Senokot-S as an outpatient -Resume home Linzess, MiraLAX and Senokot-S  Vasomotor symptoms due to menopause History of hysterectomy with bilateral salpingo-oopherectomy. Patient is on Premarin daily -Continue Premarin  Hypokalemia Potassium of 3.1 on admission BMP -Kdur 40 meq PO x2     Advance Care Planning: Full code  Consults: None  Family Communication: Father at bedside   Author: Cordelia Poche, MD 01/17/2022 3:17 PM  For on call review [email or web address].

## 2022-01-17 NOTE — Assessment & Plan Note (Addendum)
History of hysterectomy with bilateral salpingo-oopherectomy. Patient is on Premarin daily ?-Continue Premarin ?

## 2022-01-17 NOTE — ED Notes (Addendum)
Patient is being discharged from the Urgent Care and sent to the Emergency Department via POV driven by father. Per Eliezer Lofts, NP patient is in need of higher level of care due to fever/weakness and possibly needing CT of abd and chest. Patient is aware and verbalizes understanding of plan of care.  ?Vitals:  ? 01/17/22 0852  ?BP: 125/84  ?Pulse: (!) 114  ?Resp: 18  ?Temp: (!) 101.3 ?F (38.5 ?C)  ?SpO2: 97%  ? ? ?

## 2022-01-17 NOTE — Assessment & Plan Note (Addendum)
Satting greater than 100% on room air.  ?D-dimer unremarkable. ?

## 2022-01-17 NOTE — Assessment & Plan Note (Addendum)
Secondary to pyelonephritis. ?Blood cultures have been negative till date, urine cultures were inconclusive. ?We will transition to IV cefepime. ? ?

## 2022-01-17 NOTE — ED Provider Notes (Signed)
[name]    CSN: [id number] Arrival date & time: 01/17/22  5277      History   Chief Complaint Chief Complaint  Patient presents with   Fever   Cough   Sore Throat    HPI Nikky Duba is a 36 y.o. female.   HPI 36 year old female presents with fever, cough, sore throat for 2 days.  Patient was evaluated here on 12/16/2021 for influenza-like illness and treated with Tamiflu, Zithromax, albuterol inhaler, and albuterol Proventil nebulizer solution.  Reports that she had pneumonia last month as well.  PMH significant for asthma and poor appetite.  Past Medical History:  Diagnosis Date   ADHD (attention deficit hyperactivity disorder)    Anemia    Anxiety    Asthma    Bipolar disorder ([place])    Complication of anesthesia    slow to wake up    Delayed gastric emptying 02/23/2020   Depression    DUB (dysfunctional uterine bleeding) 02/23/2020   Endometriosis    GERD (gastroesophageal reflux disease)    Heart murmur    Hiatal hernia 04/06/2020   Myalgia 04/06/2020   Parasitic infection 02/23/2020   Pneumonia    Poor appetite 02/23/2020   RUQ pain 02/23/2020   Vitamin D deficiency    Weight loss 02/23/2020    Patient Active Problem List   Diagnosis Date Noted   Vasomotor symptoms due to menopause 08/17/2021   S/P laparoscopic hysterectomy 07/12/2021   Preoperative exam for gynecologic surgery 06/20/2021   Adverse drug reaction 06/01/2021   Draining postoperative wound 02/28/2021   Encounter for postoperative care 02/16/2021   Endometriosis determined by laparoscopy    Pelvic pain in female 10/11/2020   Presence of subdermal contraceptive implant 10/11/2020   Dysmenorrhea 10/11/2020   Dyspareunia in female 10/11/2020   NSVT (nonsustained ventricular tachycardia) 09/03/2020   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   Palpitations 07/26/2020   Family history of connective tissue disease 07/26/2020   Myalgia 04/06/2020   Hiatal hernia 04/06/2020    Parasitic infection 02/23/2020   Weight loss 02/23/2020   Poor appetite 02/23/2020   RUQ pain 02/23/2020   Delayed gastric emptying 02/23/2020   DUB (dysfunctional uterine bleeding) 02/23/2020   Cervical radiculopathy 05/31/2018   Hyperalgesia 05/31/2018   Acid reflux 12/26/2017   ADHD (attention deficit hyperactivity disorder) 05/07/2012   Bipolar 1 disorder ([place]) 05/07/2012    Past Surgical History:  Procedure Laterality Date   barium study     COLONOSCOPY     CYSTOSCOPY N/A 07/12/2021   Procedure: CYSTOSCOPY;  Surgeon: [name], MD;  Location: [facility];  Service: Gynecology;  Laterality: N/A;   ESOPHAGOGASTRODUODENOSCOPY  2011   and a ph study as well.    LAPAROSCOPY N/A 01/26/2021   Procedure: LAPAROSCOPY DIAGNOSTIC;  Surgeon: [name], MD;  Location: [place];  Service: Gynecology;  Laterality: N/A;   NASAL SEPTUM SURGERY     TONGUE FLAP     TOTAL LAPAROSCOPIC HYSTERECTOMY WITH SALPINGECTOMY Bilateral 07/12/2021   Procedure: TOTAL LAPAROSCOPIC HYSTERECTOMY WITH SALPINGECTOMY, OOPHORECTOMY;  Surgeon: [name], MD;  Location: [facility];  Service: Gynecology;  Laterality: Bilateral;   UPPER GASTROINTESTINAL ENDOSCOPY     WISDOM TOOTH EXTRACTION      OB History     Gravida  0   Para  0   Term  0   Preterm  0   AB  0   Living  0      SAB  0  IAB  0   Ectopic  0   Multiple  0   Live Births  0            Home Medications    Prior to Admission medications   Medication Sig Start Date End Date Taking? Authorizing Provider  albuterol (PROVENTIL) (2.5 MG/3ML) 0.083% nebulizer solution Take 3 mLs (2.5 mg total) by nebulization every 6 (six) hours as needed for wheezing or shortness of breath. Max 4 doses per day 12/16/21   Kandra Nicolas, MD  albuterol (VENTOLIN HFA) 108 (90 Base) MCG/ACT inhaler Inhale 1-2 puffs into the lungs every 6 (six) hours as needed for wheezing or shortness of breath. 12/16/21   Kandra Nicolas, MD   Ascorbic Acid (VITAMIN C) 1000 MG tablet Take 1,000 mg by mouth 2 (two) times daily.    [provider]  Calcium-Magnesium-Vitamin D 600-40-500 MG-MG-UNIT TB24 Take 3 tablets by mouth in the morning and at bedtime.    [provider]  Cholecalciferol (VITAMIN D3) 50 MCG (2000 UT) TABS Take 2,000 Units by mouth daily.    [provider]  EPINEPHrine 0.3 mg/0.3 mL IJ SOAJ injection Inject 0.3 mg into the muscle as needed for anaphylaxis. 05/27/21   Levin Bacon, MD  estrogens, conjugated, (PREMARIN) 0.45 MG tablet Take 1 tablet (0.45 mg total) by mouth daily. 08/17/21   [name], MD  fexofenadine (ALLEGRA) 180 MG tablet Take 180 mg by mouth daily.    [provider]  ibuprofen (ADVIL) 600 MG tablet Take 1 tablet (600 mg total) by mouth every 6 (six) hours as needed for headache, mild pain, moderate pain or cramping. 07/13/21   [name], MD  linaclotide [facility]) 290 MCG CAPS capsule Take 1 capsule (290 mcg total) by mouth daily before breakfast. 01/03/22   Mauri Pole, MD  Melatonin 10 MG TABS Take 10 mg by mouth at bedtime.    [provider]  MIRALAX 17 GM/SCOOP powder Take 17 g by mouth See admin instructions. Mix 17 g of powder into water and drink once a day    [provider]  Multiple Vitamins-Minerals (ADULT ONE DAILY GUMMIES PO) Take 2 tablets by mouth daily.    [provider]  Omega-3 Fatty Acids (FISH OIL) 1200 MG CAPS Take 1,200 mg by mouth daily.    [provider]  ondansetron (ZOFRAN) 4 MG tablet Take 1 tablet (4 mg total) by mouth every 6 (six) hours as needed for nausea. 07/13/21   [name], MD  Plecanatide (TRULANCE) 3 MG TABS Take 1 capsule by mouth daily at 2 am. Patient not taking: Reported on 01/11/2022 10/28/21   Mauri Pole, MD  promethazine (PHENERGAN) 12.5 MG tablet Take 12.5 mg by mouth See admin instructions. Take 12.5 mg by mouth every 6-8 hours as needed for  nausea    [provider]  sennosides-docusate sodium (SENOKOT-S) 8.6-50 MG tablet Take 1-2 tablets by mouth daily.    [provider]    Family History Family History  Problem Relation Age of Onset   Colon polyps Mother    Depression Mother    Anxiety disorder Mother    Hypertension Mother    Hyperlipidemia Mother    Asthma Mother    Endometriosis Mother    Fibroids Mother    Fibromyalgia Mother    Migraines Mother    Irritable bowel syndrome Mother    Depression Father    Hypertension Father  Hyperlipidemia Father    Bipolar disorder Father    Bipolar disorder Sister    ADD / ADHD Sister    Asthma Sister    Endometriosis Sister    Seizures Sister    Bipolar disorder Sister    Anxiety disorder Sister    Heart disease Maternal Grandmother    Endometriosis Maternal Grandmother    Fibroids Maternal Grandmother    Hyperlipidemia Maternal Grandmother    Hypertension Maternal Grandmother    Diabetes Maternal Grandmother    Fibromyalgia Maternal Grandmother    Heart disease Maternal Grandfather    Diabetes Maternal Grandfather    Heart disease Paternal Grandmother    Heart disease Paternal Grandfather    Colon cancer Maternal Great-grandmother    Pancreatic cancer Neg Hx    Esophageal cancer Neg Hx    Stomach cancer Neg Hx     Social History Social History   Tobacco Use   Smoking status: Never   Smokeless tobacco: Never  Vaping Use   Vaping Use: Never used  Substance Use Topics   Alcohol use: Yes    Alcohol/week: 1.0 standard drink    Types: 1 Glasses of wine per week    Comment: 1 glass of wine per week   Drug use: No     Allergies   Amphetamine-dextroamphetamine, Lupron [leuprolide], Orilissa [elagolix], Penicillins, Chlorhexidine, Doxycycline, Olanzapine, Other, Prozac [fluoxetine hcl], Tape, Triple antibiotic pain relief [neomy-bacit-polymyx-pramoxine], and Reglan [metoclopramide]   Review of Systems Review of Systems   Constitutional:  Positive for fever.  HENT:  Positive for sore throat.   Respiratory:  Positive for cough.   All other systems reviewed and are negative.   Physical Exam Triage Vital Signs ED Triage Vitals  Enc Vitals Group     BP      Pulse      Resp      Temp      Temp src      SpO2      Weight      Height      Head Circumference      Peak Flow      Pain Score      Pain Loc      Pain Edu?      Excl. in [place]?    No data found.  Updated Vital Signs BP 125/84 (BP Location: Right Arm)    Pulse (!) 114    Temp (!) 101.3 F (38.5 C) (Oral)    Resp 18    LMP  (LMP Unknown)    SpO2 97%    Physical Exam Vitals and nursing note reviewed.  Constitutional:      General: She is not in acute distress.    Appearance: Normal appearance. She is ill-appearing.     Comments: Thin/cachectic  HENT:     Head: Normocephalic and atraumatic.     Right Ear: Tympanic membrane, ear canal and external ear normal.     Left Ear: Tympanic membrane, ear canal and external ear normal.     Mouth/Throat:     Mouth: Mucous membranes are dry.     Pharynx: Oropharynx is clear.  Eyes:     Extraocular Movements: Extraocular movements intact.     Conjunctiva/sclera: Conjunctivae normal.     Pupils: Pupils are equal, round, and reactive to light.  Cardiovascular:     Rate and Rhythm: Regular rhythm. Tachycardia present.     Pulses: Normal pulses.     Heart sounds: Normal heart sounds. No murmur  heard.   No friction rub. No gallop.  Pulmonary:     Effort: Pulmonary effort is normal. No respiratory distress.     Breath sounds: No stridor. No wheezing, rhonchi or rales.     Comments: Diminished breath sounds noted throughout Musculoskeletal:     Cervical back: Normal range of motion and neck supple. No tenderness.  Lymphadenopathy:     Cervical: No cervical adenopathy.  Skin:    General: Skin is warm and dry.  Neurological:     General: No focal deficit present.     Mental Status: She is alert  and oriented to person, place, and time.     Motor: Weakness present.     Comments: Patient seated comfortably in wheelchair throughout office visit today     UC Treatments / Results  Labs (all labs ordered are listed, but only abnormal results are displayed) Labs Reviewed  POC INFLUENZA A AND B ANTIGEN (URGENT CARE ONLY)  POC SARS CORONAVIRUS 2 AG -  ED    EKG   Radiology DG Chest 2 View  Result Date: 01/17/2022 CLINICAL DATA:  36 year old female with history of cough. EXAM: CHEST - 2 VIEW COMPARISON:  Chest x-ray 09/29/2019. FINDINGS: Lung volumes are normal. Mild elevation of the left hemidiaphragm. No consolidative airspace disease. No pleural effusions. No pneumothorax. No pulmonary nodule or mass noted. Pulmonary vasculature and the cardiomediastinal silhouette are within normal limits. IMPRESSION: No radiographic evidence of acute cardiopulmonary disease. Electronically Signed   By: [name] M.D.   On: 01/17/2022 09:42    Procedures Procedures (including critical care time)  Medications Ordered in UC Medications - No data to display  Initial Impression / Assessment and Plan / UC Course  I have reviewed the triage vital signs and the nursing notes.  Pertinent labs & imaging results that were available during my care of the patient were reviewed by me and considered in my medical decision making (see chart for details).     MDM: 1.  Cough-CXR revealed mild elevation of left hemidiaphragm, no consolidative airspace disease, no pleural effusion, no pulmonary nodule or mass.  CXR reveals mild elevation of left hemidiaphragm described in report covers one third to half of left lung field, on exam patient had diminished breath sounds throughout, advised patient/Father CT of chest with contrast, possibly CT of abdomen and pelvis plus serological testing is needed now.  2.  Fever-patient reports taking 1000 mg of Tylenol just prior to office visit; 3.  Nausea-Zofran 4 mg given  sublingual prior to discharge. Advised patient to go to [facility] ED now for further evaluation of cough and fever.  Advised/informed patient of today's chest x-ray results.  Advised patient/Father need of advanced imaging chest CT/CT of abdomen and pelvis, serological testing including blood cultures.  Patient/Father agreed and verbalized understanding of these instructions and this plan of care today. Patient discharged, hemodynamically stable. Final Clinical Impressions(s) / UC Diagnoses   Final diagnoses:  Cough, unspecified type  Fever, unspecified  Nausea     Discharge Instructions      Advised patient to go to [facility] ED now for further evaluation of cough and fever.  Advised/informed patient of today's chest x-ray results.  Advised patient/Father need of advanced imaging chest CT/CT of abdomen and pelvis, serological testing including blood cultures.  Patient/Father agreed and verbalized understanding of these instructions and this plan of care today.     ED Prescriptions   None    PDMP not reviewed this  encounter.   Eliezer Lofts, Bonnie 01/17/22 1008

## 2022-01-17 NOTE — Assessment & Plan Note (Addendum)
Body mass index is 17.96 kg/m?[name] Kitchen  ?Delayed gastric emptying. ?Continue small frequent meals ?

## 2022-01-17 NOTE — Progress Notes (Signed)
MD via phone updated regarding Pt's Temperature of 103.2 orally and Pt is a yellow muse. New orders placed  ?

## 2022-01-17 NOTE — Progress Notes (Signed)
Confirmed with MD Pt is not for Telemetry at this time ?

## 2022-01-17 NOTE — Discharge Instructions (Addendum)
Advised patient to go to [facility] ED now for further evaluation of cough and fever.  Advised/informed patient of today's chest x-ray results.  Advised patient/Father need of advanced imaging chest CT/CT of abdomen and pelvis, serological testing including blood cultures.  Patient/Father agreed and verbalized understanding of these instructions and this plan of care today. ?

## 2022-01-17 NOTE — ED Provider Notes (Signed)
[facility] DEPT Provider Note   CSN: [id number] Arrival date & time: 01/17/22  1025     History  Chief Complaint  Patient presents with   Fever   Cough   Shortness of Breath   Chest Pain    Rachel [name] is a 36 y.o. female with history of gastroparesis who presents to the ED from urgent care for evaluation of respiratory symptoms as well as abdominal pain for approximately 2 days.  Patient states she had a fever of close to 105 last night which improved with Tylenol.  She was noted to have a temperature of 101.3 at urgent care today and was given Motrin with improvement.  Abdominal pain started a few days ago as well, notes that it is worse in the left upper quadrant and epigastrically.  Patient has GI care managed with Dr. Silverio Decamp at [facility] and she reports that there has been steady improvement with her gastroparesis symptoms.  She was having normal bowel movements approximately every 3 days.  However, she had a bowel movement yesterday that was very small, hard, and the previous episode before that was approximately 5 days ago and was similarly small amount.  Initially, she went to urgent care for cough and sore throat along with her fever, although COVID and flu test were negative.  She also has decreased urinary output with her last urination last night.  Denies dysuria or hematuria.  Per chart review, patient was furred here to the ED after her chest x-ray identified a slightly elevated left hemidiaphragm.  I personally evaluated this imaging and this appears to be secondary from either distention of the bowel or stomach.   Fever Associated symptoms: chest pain and cough   Cough Associated symptoms: chest pain, fever and shortness of breath   Shortness of Breath Associated symptoms: chest pain, cough and fever   Chest Pain Associated symptoms: cough, fever and shortness of breath       Home Medications Prior to Admission medications    Medication Sig Start Date End Date Taking? Authorizing Provider  albuterol (PROVENTIL) (2.5 MG/3ML) 0.083% nebulizer solution Take 3 mLs (2.5 mg total) by nebulization every 6 (six) hours as needed for wheezing or shortness of breath. Max 4 doses per day 12/16/21   Kandra Nicolas, MD  albuterol (VENTOLIN HFA) 108 (90 Base) MCG/ACT inhaler Inhale 1-2 puffs into the lungs every 6 (six) hours as needed for wheezing or shortness of breath. 12/16/21   Kandra Nicolas, MD  Ascorbic Acid (VITAMIN C) 1000 MG tablet Take 1,000 mg by mouth 2 (two) times daily.    [provider]  Calcium-Magnesium-Vitamin D 600-40-500 MG-MG-UNIT TB24 Take 3 tablets by mouth in the morning and at bedtime.    [provider]  Cholecalciferol (VITAMIN D3) 50 MCG (2000 UT) TABS Take 2,000 Units by mouth daily.    [provider]  EPINEPHrine 0.3 mg/0.3 mL IJ SOAJ injection Inject 0.3 mg into the muscle as needed for anaphylaxis. 05/27/21   Levin Bacon, MD  estrogens, conjugated, (PREMARIN) 0.45 MG tablet Take 1 tablet (0.45 mg total) by mouth daily. 08/17/21   [name], MD  fexofenadine (ALLEGRA) 180 MG tablet Take 180 mg by mouth daily.    [provider]  ibuprofen (ADVIL) 600 MG tablet Take 1 tablet (600 mg total) by mouth every 6 (six) hours as needed for headache, mild pain, moderate pain or cramping. 07/13/21   [name], MD  linaclotide Rolan Lipa)  290 MCG CAPS capsule Take 1 capsule (290 mcg total) by mouth daily before breakfast. 01/03/22   Mauri Pole, MD  Melatonin 10 MG TABS Take 10 mg by mouth at bedtime.    [provider]  MIRALAX 17 GM/SCOOP powder Take 17 g by mouth See admin instructions. Mix 17 g of powder into water and drink once a day    [provider]  Multiple Vitamins-Minerals (ADULT ONE DAILY GUMMIES PO) Take 2 tablets by mouth daily.    [provider]  Omega-3 Fatty Acids (FISH OIL) 1200 MG CAPS Take 1,200 mg by  mouth daily.    [provider]  ondansetron (ZOFRAN) 4 MG tablet Take 1 tablet (4 mg total) by mouth every 6 (six) hours as needed for nausea. 07/13/21   [name], MD  Plecanatide (TRULANCE) 3 MG TABS Take 1 capsule by mouth daily at 2 am. Patient not taking: Reported on 01/11/2022 10/28/21   Mauri Pole, MD  promethazine (PHENERGAN) 12.5 MG tablet Take 12.5 mg by mouth See admin instructions. Take 12.5 mg by mouth every 6-8 hours as needed for nausea    [provider]  sennosides-docusate sodium (SENOKOT-S) 8.6-50 MG tablet Take 1-2 tablets by mouth daily.    [provider]      Allergies    Amphetamine-dextroamphetamine, Lupron [leuprolide], Orilissa [elagolix], Penicillins, Chlorhexidine, Doxycycline, Olanzapine, Other, Prozac [fluoxetine hcl], Tape, Triple antibiotic pain relief [neomy-bacit-polymyx-pramoxine], and Reglan [metoclopramide]    Review of Systems   Review of Systems  Constitutional:  Positive for fever.  Respiratory:  Positive for cough and shortness of breath.   Cardiovascular:  Positive for chest pain.   Physical Exam Updated Vital Signs Pulse (!) 118    Temp 98.7 F (37.1 C) (Oral)    Resp 18    Ht 5' 2.5" (1.588 m)    Wt 45.3 kg    LMP  (LMP Unknown)    SpO2 96%    BMI 17.96 kg/m  Physical Exam Vitals and nursing note reviewed.  Constitutional:      General: She is not in acute distress.    Appearance: She is ill-appearing.     Comments: Thin, ill-appearing  HENT:     Head: Atraumatic.  Eyes:     Conjunctiva/sclera: Conjunctivae normal.  Cardiovascular:     Rate and Rhythm: Regular rhythm. Tachycardia present.     Pulses: Normal pulses.     Heart sounds: No murmur heard. Pulmonary:     Effort: Pulmonary effort is normal. Tachypnea present. No respiratory distress.     Breath sounds: Normal breath sounds.  Abdominal:     General: Abdomen is flat. There is no distension.     Palpations: Abdomen is soft.      Tenderness: There is abdominal tenderness in the left upper quadrant. There is left CVA tenderness. There is no right CVA tenderness.  Musculoskeletal:        General: Normal range of motion.     Cervical back: Normal range of motion.  Skin:    General: Skin is warm and dry.     Capillary Refill: Capillary refill takes less than 2 seconds.  Neurological:     General: No focal deficit present.     Mental Status: She is alert.  Psychiatric:        Mood and Affect: Mood normal.    ED Results / Procedures / Treatments   Labs (all labs ordered are listed, but only abnormal results  are displayed) Labs Reviewed  COMPREHENSIVE METABOLIC PANEL - Abnormal; Notable for the following components:      Result Value   Sodium 133 (*)    Potassium 3.1 (*)    Glucose, Bld 107 (*)    Calcium 8.7 (*)    AST 14 (*)    All other components within normal limits  CBC WITH DIFFERENTIAL/PLATELET - Abnormal; Notable for the following components:   WBC 14.6 (*)    Neutro Abs 11.8 (*)    Monocytes Absolute 1.8 (*)    All other components within normal limits  CULTURE, BLOOD (ROUTINE X 2)  CULTURE, BLOOD (ROUTINE X 2)  URINE CULTURE  RESP PANEL BY RT-PCR (FLU A&B, COVID) ARPGX2  LIPASE, BLOOD  LACTIC ACID, PLASMA  URINALYSIS, ROUTINE W REFLEX MICROSCOPIC  LACTIC ACID, PLASMA    EKG None  Radiology DG Chest 2 View  Result Date: 01/17/2022 CLINICAL DATA:  36 year old female with history of cough. EXAM: CHEST - 2 VIEW COMPARISON:  Chest x-ray 09/29/2019. FINDINGS: Lung volumes are normal. Mild elevation of the left hemidiaphragm. No consolidative airspace disease. No pleural effusions. No pneumothorax. No pulmonary nodule or mass noted. Pulmonary vasculature and the cardiomediastinal silhouette are within normal limits. IMPRESSION: No radiographic evidence of acute cardiopulmonary disease. Electronically Signed   By: [name] M.D.   On: 01/17/2022 09:42   CT ABDOMEN PELVIS W CONTRAST  Result  Date: 01/17/2022 CLINICAL DATA:  Fever, cough, shortness of breath for 2 days. Hypokalemia. Leukocytosis. EXAM: CT ABDOMEN AND PELVIS WITH CONTRAST TECHNIQUE: Multidetector CT imaging of the abdomen and pelvis was performed using the standard protocol following bolus administration of intravenous contrast. RADIATION DOSE REDUCTION: This exam was performed according to the departmental dose-optimization program which includes automated exposure control, adjustment of the mA and/or kV according to patient size and/or use of iterative reconstruction technique. CONTRAST:  5m OMNIPAQUE IOHEXOL 300 MG/ML  SOLN COMPARISON:  05/26/2021 FINDINGS: Lower chest: Unremarkable Hepatobiliary: Unremarkable Pancreas: Unremarkable Spleen: Unremarkable Adrenals/Urinary Tract: New abnormal hypoenhancing 2.1 by 1.6 by 1.2 cm segment of the left kidney upper pole, image 69 series 5. No cortical rim sign appreciable on CT. Additional smaller cortical areas of striated hypodensity posteriorly in the right kidney upper pole on image 31 series 2. The right kidney appears normal. No hydronephrosis or hydroureter. Urinary bladder unremarkable. Adrenal glands appear normal. Stomach/Bowel: Air-fluid levels are present in the distal colon favoring diarrheal process. Mild gaseous prominence of the colon. No pneumatosis or portal venous gas. No substantially dilated small bowel. Appendix poorly seen. Vascular/Lymphatic: Unremarkable Reproductive: Uterus absent.  Adnexa unremarkable. Other: No supplemental non-categorized findings. Musculoskeletal: Unremarkable IMPRESSION: 1. New abnormal hypoenhancing regions in the left kidney upper pole. Given the clinical context, the appearance favors acute left renal pyelonephritis. 2. Air fluid levels in the distal colon compatible with diarrheal process. Mild gaseous prominence of the colon. Electronically Signed   By: WVan ClinesM.D.   On: 01/17/2022 13:08    Procedures .Critical Care Performed  by: CTonye Pearson PA-C Authorized by: CTonye Pearson PA-C   Critical care provider statement:    Critical care time (minutes):  30   Critical care start time:  01/17/2022 12:00 PM   Critical care end time:  01/17/2022 12:30 PM   Critical care was necessary to treat or prevent imminent or life-threatening deterioration of the following conditions:  Renal failure and sepsis   Critical care was time spent personally by me on the following activities:  Development of treatment plan with patient or surrogate, discussions with consultants, evaluation of patient's response to treatment, examination of patient, ordering and review of laboratory studies, ordering and review of radiographic studies, ordering and performing treatments and interventions, pulse oximetry, re-evaluation of patient's condition and review of old charts    Medications Ordered in ED Medications  lactated ringers bolus 1,000 mL (has no administration in time range)  droperidol (INAPSINE) 2.5 MG/ML injection 1.25 mg (has no administration in time range)  fentaNYL (SUBLIMAZE) injection 25 mcg (has no administration in time range)    ED Course/ Medical Decision Making/ A&P                           Medical Decision Making Amount and/or Complexity of Data Reviewed Labs: ordered. Radiology: ordered.  Risk Prescription drug management. Decision regarding hospitalization.   History:  Per HPI Social determinants of health: medicaid  Initial impression:  This patient presents to the ED for concern of abdominal pain, nausea and general malaise, this involves an extensive number of treatment options, and is a complaint that carries with it a high risk of complications and morbidity.   The differential diagnosis of emergent flank pain includes, but is not limited to :Abdominal aortic aneurysm,, Renal artery embolism,Renal vein thrombosis, Aortic dissection, Mesenteric ischemia, Pyelonephritis, Renal infarction, Renal  hemorrhage, Nephrolithiasis/ Renal Colic, Bladder tumor,Cystitis, Biliary colic, Pancreatitis, Perforated peptic ulcer Appendicitis ,Inguinal Hernia, Diverticulitis, Bowel obstruction Ectopic Pregnancy,PID/TOA,Ovarian cyst, Ovarian torsion) Shingles, Lower lobe pneumonia, Retroperitoneal hematoma/abscess/tumor, Epidural abscess, Epidural hematoma   Lab Tests and EKG:  I Ordered, reviewed, and interpreted labs and EKG.  The pertinent results include:  CMP with mild hypokalemia but otherwise without acute findings.  Normal kidney function CBC with leukocytosis of 14.6 Lipase normal Lactic acid normal Pending UA as patient is unable to produce urine sample Pending blood cultures, respiratory panel and urine culture   Imaging Studies ordered:  I ordered imaging studies including  CT abdomen pelvis with likely pyelonephritis I independently visualized and interpreted imaging and I agree with the radiologist interpretation.    Cardiac Monitoring:  The patient was maintained on a cardiac monitor.  I personally viewed and interpreted the cardiac monitored which showed an underlying rhythm of: NSR   Medicines ordered and prescription drug management:  I ordered medication including: Fentanyl 25 mcg for pain Dilaudid 1 mg for pain 1 L lactated ringer bolus 400 mg Cipro IV Reevaluation of the patient after these medicines showed that the patient improved I have reviewed the patients home medicines and have made adjustments as needed   Critical Interventions:  Renal failure and sepsis prevention as described above   ED Course: 36 year old female, ill-appearing who presents to the ED for evaluation of an abnormal chest x-ray at urgent care with complaints of fever abdominal pain and nausea.  Patient initially given 25 mcg of fentanyl for pain relief without significant improvement in symptoms and additional 1 mg Dilaudid was also given with successful management of pain.  Labs show  leukocytosis of 14.4 with no endorgan kidney damage, however CT abdomen identifies what appears to be pyelonephritis of the left kidney.  This does coordinate clinically.  Patient has an anaphylactic reaction to penicillins and her father and mother are unsure if this translates over to cephalosporins as well.  Out of abundance of caution I administered ciprofloxacin 400 mg IV for her infection.  Patient appears acutely ill and since she is chronically ill-appearing,  I think she qualifies for admission.  Patient and family agree with this plan.  Disposition:  After consideration of the diagnostic results, physical exam, history and the patients response to treatment feel that the patent would benefit from admission.   Pyelonephritis left: Dr. Lester Kinsman spoke with Dr. Lonny Prude who agrees to admit patient.  Ultimate treatment and disposition to be determined by admitting provider.   Final Clinical Impression(s) / ED Diagnoses Final diagnoses:  Pyelonephritis    Rx / DC Orders ED Discharge Orders     None         Tonye Pearson, PA-C 01/17/22 1414    Lucrezia Starch, MD 01/18/22 1205

## 2022-01-17 NOTE — Assessment & Plan Note (Addendum)
Repleted orally now resolved. ?

## 2022-01-17 NOTE — ED Triage Notes (Signed)
Patient c/o fever, cough, sore throat and SOB x 2 days. Patient states that she went to a [facility] today and was sent to the ED for further evaluation. Patient states her T. Was 104.8 last last night. ?

## 2022-01-17 NOTE — Assessment & Plan Note (Addendum)
Patient has seen many specialist in the past, no current emesis at this point in time. ?Continue frequent small diet. ?-Dietitian consult ?Pending vitamin a, K, thiamine, B6, zinc and magnesium. ? ?

## 2022-01-18 DIAGNOSIS — R1012 Left upper quadrant pain: Secondary | ICD-10-CM | POA: Diagnosis not present

## 2022-01-18 DIAGNOSIS — F419 Anxiety disorder, unspecified: Secondary | ICD-10-CM | POA: Diagnosis not present

## 2022-01-18 DIAGNOSIS — R059 Cough, unspecified: Secondary | ICD-10-CM | POA: Diagnosis not present

## 2022-01-18 DIAGNOSIS — Z681 Body mass index (BMI) 19 or less, adult: Secondary | ICD-10-CM | POA: Diagnosis not present

## 2022-01-18 DIAGNOSIS — E43 Unspecified severe protein-calorie malnutrition: Secondary | ICD-10-CM | POA: Insufficient documentation

## 2022-01-18 DIAGNOSIS — K30 Functional dyspepsia: Secondary | ICD-10-CM | POA: Diagnosis not present

## 2022-01-18 DIAGNOSIS — Z818 Family history of other mental and behavioral disorders: Secondary | ICD-10-CM | POA: Diagnosis not present

## 2022-01-18 DIAGNOSIS — Z83438 Family history of other disorder of lipoprotein metabolism and other lipidemia: Secondary | ICD-10-CM | POA: Diagnosis not present

## 2022-01-18 DIAGNOSIS — K3184 Gastroparesis: Secondary | ICD-10-CM | POA: Diagnosis not present

## 2022-01-18 DIAGNOSIS — N809 Endometriosis, unspecified: Secondary | ICD-10-CM | POA: Diagnosis not present

## 2022-01-18 DIAGNOSIS — N12 Tubulo-interstitial nephritis, not specified as acute or chronic: Principal | ICD-10-CM | POA: Insufficient documentation

## 2022-01-18 DIAGNOSIS — N2889 Other specified disorders of kidney and ureter: Secondary | ICD-10-CM | POA: Diagnosis not present

## 2022-01-18 DIAGNOSIS — K582 Mixed irritable bowel syndrome: Secondary | ICD-10-CM | POA: Diagnosis not present

## 2022-01-18 DIAGNOSIS — R609 Edema, unspecified: Secondary | ICD-10-CM | POA: Diagnosis not present

## 2022-01-18 DIAGNOSIS — J45909 Unspecified asthma, uncomplicated: Secondary | ICD-10-CM | POA: Diagnosis not present

## 2022-01-18 DIAGNOSIS — A419 Sepsis, unspecified organism: Secondary | ICD-10-CM | POA: Diagnosis not present

## 2022-01-18 DIAGNOSIS — F319 Bipolar disorder, unspecified: Secondary | ICD-10-CM | POA: Diagnosis not present

## 2022-01-18 DIAGNOSIS — K219 Gastro-esophageal reflux disease without esophagitis: Secondary | ICD-10-CM | POA: Diagnosis not present

## 2022-01-18 DIAGNOSIS — E871 Hypo-osmolality and hyponatremia: Secondary | ICD-10-CM

## 2022-01-18 DIAGNOSIS — Z8249 Family history of ischemic heart disease and other diseases of the circulatory system: Secondary | ICD-10-CM | POA: Diagnosis not present

## 2022-01-18 DIAGNOSIS — Z825 Family history of asthma and other chronic lower respiratory diseases: Secondary | ICD-10-CM | POA: Diagnosis not present

## 2022-01-18 DIAGNOSIS — R197 Diarrhea, unspecified: Secondary | ICD-10-CM | POA: Diagnosis not present

## 2022-01-18 DIAGNOSIS — G8929 Other chronic pain: Secondary | ICD-10-CM | POA: Diagnosis not present

## 2022-01-18 DIAGNOSIS — R000 Tachycardia, unspecified: Secondary | ICD-10-CM | POA: Diagnosis not present

## 2022-01-18 DIAGNOSIS — R109 Unspecified abdominal pain: Secondary | ICD-10-CM | POA: Diagnosis not present

## 2022-01-18 DIAGNOSIS — N10 Acute tubulo-interstitial nephritis: Secondary | ICD-10-CM | POA: Diagnosis not present

## 2022-01-18 DIAGNOSIS — E876 Hypokalemia: Secondary | ICD-10-CM | POA: Diagnosis not present

## 2022-01-18 DIAGNOSIS — Z833 Family history of diabetes mellitus: Secondary | ICD-10-CM | POA: Diagnosis not present

## 2022-01-18 DIAGNOSIS — Z800 Family history of malignant neoplasm of digestive organs: Secondary | ICD-10-CM | POA: Diagnosis not present

## 2022-01-18 DIAGNOSIS — E861 Hypovolemia: Secondary | ICD-10-CM | POA: Diagnosis not present

## 2022-01-18 DIAGNOSIS — Z8371 Family history of colonic polyps: Secondary | ICD-10-CM | POA: Diagnosis not present

## 2022-01-18 DIAGNOSIS — D509 Iron deficiency anemia, unspecified: Secondary | ICD-10-CM | POA: Diagnosis not present

## 2022-01-18 DIAGNOSIS — Z20822 Contact with and (suspected) exposure to covid-19: Secondary | ICD-10-CM | POA: Diagnosis not present

## 2022-01-18 LAB — COMPREHENSIVE METABOLIC PANEL
ALT: 8 U/L (ref 0–44)
AST: 13 U/L — ABNORMAL LOW (ref 15–41)
Albumin: 3 g/dL — ABNORMAL LOW (ref 3.5–5.0)
Alkaline Phosphatase: 44 U/L (ref 38–126)
Anion gap: 4 — ABNORMAL LOW (ref 5–15)
BUN: 8 mg/dL (ref 6–20)
CO2: 26 mmol/L (ref 22–32)
Calcium: 8.6 mg/dL — ABNORMAL LOW (ref 8.9–10.3)
Chloride: 102 mmol/L (ref 98–111)
Creatinine, Ser: 0.55 mg/dL (ref 0.44–1.00)
GFR, Estimated: >60 mL/min (ref >60)
Glucose, Bld: 126 mg/dL — ABNORMAL HIGH (ref 70–99)
Potassium: 4.2 mmol/L (ref 3.5–5.1)
Sodium: 132 mmol/L — ABNORMAL LOW (ref 135–145)
Total Bilirubin: 0.4 mg/dL (ref 0.3–1.2)
Total Protein: 6 g/dL — ABNORMAL LOW (ref 6.5–8.1)

## 2022-01-18 LAB — CBC
HCT: 32.2 % — ABNORMAL LOW (ref 36.0–46.0)
Hemoglobin: 10.7 g/dL — ABNORMAL LOW (ref 12.0–15.0)
MCH: 31.7 pg (ref 26.0–34.0)
MCHC: 33.2 g/dL (ref 30.0–36.0)
MCV: 95.3 fL (ref 80.0–100.0)
Platelets: 144 10*3/uL — ABNORMAL LOW (ref 150–400)
RBC: 3.38 MIL/uL — ABNORMAL LOW (ref 3.87–5.11)
RDW: 14.9 % (ref 11.5–15.5)
WBC: 12.9 10*3/uL — ABNORMAL HIGH (ref 4.0–10.5)
nRBC: 0 % (ref 0.0–0.2)

## 2022-01-18 LAB — URINE CULTURE: Culture: <10000 — AB

## 2022-01-18 MED ORDER — SODIUM CHLORIDE 0.9 % IV SOLN
1.0000 g | INTRAVENOUS | Status: DC
  Administered 2022-01-18: 1 g via INTRAVENOUS
  Filled 2022-01-18: qty 10

## 2022-01-18 MED ORDER — ADULT MULTIVITAMIN LIQUID CH
15.0000 mL | Freq: Every day | ORAL | Status: DC
Start: 2022-01-18 — End: 2022-01-20
  Administered 2022-01-18 – 2022-01-20 (×3): 15 mL
  Filled 2022-01-18 (×3): qty 15

## 2022-01-18 MED ORDER — ALBUTEROL SULFATE HFA 108 (90 BASE) MCG/ACT IN AERS: 1.0000 | INHALATION_SPRAY | Freq: Four times a day (QID) | RESPIRATORY_TRACT | Status: DC | PRN

## 2022-01-18 MED ORDER — SODIUM CHLORIDE 0.9 % IV SOLN: 1.0000 g | INTRAVENOUS | Status: DC

## 2022-01-18 MED ORDER — ENSURE ENLIVE PO LIQD
237.0000 mL | Freq: Two times a day (BID) | ORAL | Status: DC
  Administered 2022-01-19: 10:00:00 237 mL via ORAL

## 2022-01-18 MED ORDER — PROSOURCE PLUS PO LIQD
30.0000 mL | Freq: Every day | ORAL | Status: DC
  Administered 2022-01-18 – 2022-01-20 (×3): 30 mL via ORAL
  Filled 2022-01-18 (×3): qty 30

## 2022-01-18 MED ORDER — DIPHENHYDRAMINE HCL 25 MG PO CAPS
25.0000 mg | ORAL_CAPSULE | Freq: Four times a day (QID) | ORAL | Status: DC | PRN
  Administered 2022-01-18 – 2022-01-20 (×6): 25 mg via ORAL
  Filled 2022-01-18 (×7): qty 1

## 2022-01-18 MED ORDER — ZOLPIDEM TARTRATE 5 MG PO TABS
5.0000 mg | ORAL_TABLET | Freq: Every evening | ORAL | Status: DC | PRN
  Administered 2022-01-18 – 2022-01-20 (×3): 5 mg via ORAL
  Filled 2022-01-18 (×3): qty 1

## 2022-01-18 MED ORDER — HYDROXYZINE HCL 10 MG/5ML PO SYRP
25.0000 mg | ORAL_SOLUTION | Freq: Three times a day (TID) | ORAL | Status: DC | PRN
  Administered 2022-01-18: 25 mg via ORAL
  Filled 2022-01-18 (×5): qty 12.5

## 2022-01-18 NOTE — Progress Notes (Signed)
?  Transition of Care (TOC) Screening Note ? ? ?Patient Details  ?Name: Rachel [name] ?Date of Birth: Mar 23, 1986 ? ? ?Transition of Care [facility]) CM/SW Contact:    ?Dessa Phi, RN ?Phone Number: ?01/18/2022, 10:36 AM ? ? ? ?Transition of Care Department [facility]) has reviewed patient and no TOC needs have been identified at this time. We will continue to monitor patient advancement through interdisciplinary progression rounds. If new patient transition needs arise, please place a TOC consult. ?  ?

## 2022-01-18 NOTE — Assessment & Plan Note (Addendum)
Improved with fluid resuscitation. ?

## 2022-01-18 NOTE — Hospital Course (Signed)
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

## 2022-01-18 NOTE — Progress Notes (Addendum)
?  Progress Note ? ? ?Patient: Rachel [name] LGX:[id number] DOB: 1986-08-16 DOA: 01/17/2022     0 ?DOS: the patient was seen and examined on 01/18/2022 ?  ?Brief hospital course: ?36 year old with past medical history significant for bipolar disorder, endometrial/dysfunctional uterine bleed status post hysterectomy with bilateral salpingo-oophorectomy comes in with a temperature of 104 fever nausea but no emesis was found to be in acute left pyelonephritis ? ?Assessment and Plan: ?* Pyelonephritis of left kidney ?Tmax of 101.2, CT show evidence of left pyelonephritis. ?Started empirically on ciprofloxacin will transition to IV Rocephin, there is  less than 5% cross-reactivity between Cephalosporin and penicillin.. ? ? ?Sepsis ([place]) ?Secondary to pyelonephritis. ?Blood cultures and urine cultures are pending continue IV Rocephin. ? ? ?Delayed gastric emptying ?Patient has seen many specialist in the past, no current emesis at this point in time. ?Continue frequent small diet. ?-Dietitian consult ? ? ?Chronic abdominal pain ?Patient confirms current abdominal pain is chronic. ?-Continue ibuprofen PRN ?-Norco PRN for severe pain ? ?Acute hyponatremia ?Likely hypovolemic continue IV fluids recheck in the morning ? ?Dyspnea ?Satting greater than 100% on room air.  ?D-dimer unremarkable. ? ?Underweight ?Body mass index is 17.96 kg/m?.  ?-Dietitian consult as mentioned in problem, Delayed gastric emptying ? ?Vasomotor symptoms due to menopause ?History of hysterectomy with bilateral salpingo-oopherectomy. Patient is on Premarin daily ?-Continue Premarin ? ?Irritable bowel syndrome (IBS) ?Alternating constipation and diarrhea. ?Continue home Linzess, MiraLAX and Senokot-S ? ?Hypokalemia ?Potassium of 3.1 on admission BMP ?-Kdur 40 meq PO x2 ? ? ? ? ?  ? ?Subjective:  ?Relates her pain is now better continues to have fevers feels anorexic ? ?Physical Exam: ?Vitals:  ? 01/17/22 1950 01/17/22 2056 01/18/22 0014 01/18/22 0357   ?BP: 98/65  91/63 102/65  ?Pulse: 92  87 65  ?Resp: '19  16 18  '$ ?Temp: 97.6 ?F (36.4 ?C) (!) 100.4 ?F (38 ?C) 99.3 ?F (37.4 ?C) 98.8 ?F (37.1 ?C)  ?TempSrc: Oral Oral Oral Oral  ?SpO2: 97%  96% 100%  ?Weight:      ?Height:      ? ?General exam: In no acute distress. ?Respiratory system: Good air movement and clear to auscultation. ?Cardiovascular system: S1 & S2 heard, RRR. No JVD. ?Gastrointestinal system: Abdomen is nondistended, soft and nontender.  ?Extremities: No pedal edema. ?Skin: No rashes, lesions or ulcers ?Data Reviewed: ? ?Still has a leukocytosis and anemia on CBC, I have reviewed the basic metabolic panel and she is is hyponatremic, ? ?Family Communication: mother ? ?Disposition: ?Status is: Observation ?The patient will require care spanning > 2 midnights and should be moved to inpatient because: Due to acute pyelonephritis due to sepsis ? Planned Discharge Destination: Home ? ? ? ?Time spent: 35 minutes ? ?Author: ?Charlynne Cousins, MD ?01/18/2022 7:43 AM ? ?For on call review [email or web address].  ?

## 2022-01-18 NOTE — Progress Notes (Signed)
Initial Nutrition Assessment ? ?DOCUMENTATION CODES:  ? ?Severe malnutrition in context of chronic illness ? ?INTERVENTION:  ?- continue Ensure Plus High Protein BID, each supplement provides 350 kcal and 20 grams of protein. ?- will order 15 ml liquid multivitamin/day.  ?- will order 30 ml Prosource Plus once/day, each supplement provides 100 kcal and 15 grams protein.  ? ?- will check serum vitamin A, vitamin K, anemia panel, and serum thiamine d/t severe gastroparesis, hx of severe endometriosis s/p hysterectomy, and severe malnutrition ? ?- if results come back after d/c, RD will contact PCP with results.  ? ?- if patient is a candidate outpatient , recommend J-tube placement.  ? ?- recommendation for [facility] 1.4 @ 25 ml/hr x12 hours/day to advance by 10 ml every 3 days to reach goal rate of 75 ml/hr x12 hours/day with 45 ml Prosource TF (or equivalent) BID and 100 ml free water every 4 hours of 12 hour run time. ? ?- at goal rate, this regimen will provide 1340 kcal (74% kcal need of current estimate), 77 grams protein (85% protein need), and 948 ml free water. ? ? ?NUTRITION DIAGNOSIS:  ? ?Severe Malnutrition related to chronic illness (delayed gastric emptying/gastroparesis) as evidenced by severe fat depletion, severe muscle depletion. ? ?GOAL:  ? ?Patient will meet greater than or equal to 90% of their needs ? ?MONITOR:  ? ?PO intake, Supplement acceptance, Labs, Weight trends ? ?REASON FOR ASSESSMENT:  ? ?Malnutrition Screening Tool, Consult ?Assessment of nutrition requirement/status ? ?ASSESSMENT:  ? ?36 year old with medical history of bipolar disorder, endometrial/dysfunctional uterine bleed s/p hysterectomy with bilateral salpingo-oophorectomy, anemia, depression, ADHD, asthma, anemia, GERD, delayed gastric emptying, anxiety, vitamin D deficiency, and heart murmur. She presented to the ED due to temperature of 104 and nausea without emesis. She was admitted due to sepsis 2/2  pyelonephritis. ? ?Patient laying in bed with mom, who is a Psychologist, educational, at bedside. Information throughout conversation provided by both patient and mom.  ? ?Patient was dx with gastroparesis in 2009. She recounts trying all available prokinetics without benefit. She has tried diet modifications and other techniques over the years. She experiences flares in gastroparesis during which time she is only able to consume and tolerate liquids or purees. Flares can be brought on by a number of things to include dairy with higher than 2% fat, high fat items, pork, high sodium items, artificial sweetners.  ? ?At home she takes a gummy multivitamin, gummy, melatonin, and tablet form of vitamin D, vitamin C, and calcium. She has not taken iron supplement in several years.  ? ?At home she focuses on low fiber foods and high protein foods. She is unable to tolerate raw fruits or veggies or raw nuts. She is typically able to tolerate nut butters, alternative milks (such as oat and almond), soft protein foods such as chicken, yogurt, and protein powder.  ? ?She intermittently consumes a protein shake that contains ice, frozen fruit, peanut butter, alternative milk, and protein powder. Encouraged ways to make it feasible for her to have this available every day. Also discussed thoroughly chewing food, separating times she is eating vs drinking, having something to eat or to drink every ~2 hours.  ? ?Broached the topic of J-tube. Mom is very familiar with these due to the nature of her job and shares that several years ago patient was resistant to this option. Today patient is open to this suggestion. She was able to express the concerns or hesitations she  has and we were able to talk through these points.  ? ?Shared with patient that tube feeding could be used to supplement PO intake on good days and used more as a primary source of nutrition on bad days. Also discussed that it can be a temporary.  ? ?Weight yesterday was  documented as both 103 lb and 100 lb and appears mainly stable since 06/17/21. Patient and mom report lowest weight was 92 lb. ? ? ?Labs reviewed; Na: 132 mmol/l, Ca: 8.6 mg/dl. ? ?Medications reviewed; 1000 mg ascorbic acid BID, 2000 units cholecalciferol/day, 10 mg melatonin/night, 17 g miralax/day, 40 mEq Klor-Con x2 doses 3/7, 2 tablets senokot/day.  ? ?IVF; LR @ 100 ml/hr.  ?  ? ?NUTRITION - FOCUSED PHYSICAL EXAM: ? ?Flowsheet Row Most Recent Value  ?Orbital Region Severe depletion  ?Upper Arm Region Moderate depletion  [non-pitting edema present]  ?Thoracic and Lumbar Region Severe depletion  ?Buccal Region Moderate depletion  [non-pitting edema present]  ?Temple Region Moderate depletion  ?Clavicle Bone Region Severe depletion  ?Clavicle and Acromion Bone Region Severe depletion  ?Scapular Bone Region Severe depletion  ?Dorsal Hand Mild depletion  ?Patellar Region Mild depletion  [mild pitting edema present]  ?Anterior Thigh Region Mild depletion  [mild pitting edema present]  ?Posterior Calf Region Mild depletion  [mild pitting edema present]  ?Edema (RD Assessment) Mild  [all extremities]  ?Hair Reviewed  ?Eyes Reviewed  ?Mouth Reviewed  ?Skin Reviewed  ?Nails Unable to assess  [nail polish]  ? ?  ? ? ?Diet Order:   ?Diet Order   ? ?       ?  DIET SOFT Room service appropriate? Yes; Fluid consistency: Thin  Diet effective now       ?  ? ?  ?  ? ?  ? ? ?EDUCATION NEEDS:  ? ?Education needs have been addressed ? ?Skin:  Skin Assessment: Reviewed RN Assessment ? ?Last BM:  PTA/unknown ? ?Height:  ? ?Ht Readings from Last 1 Encounters:  ?01/17/22 5' 2.5" (1.588 m)  ? ? ?Weight:  ? ?Wt Readings from Last 1 Encounters:  ?01/17/22 46.8 kg  ? ? ? ?BMI:  Body mass index is 18.57 kg/m?. ? ?Estimated Nutritional Needs:  ?Kcal:  1800-2050 kcal ?Protein:  90-105 grams ?Fluid:  >/= 2.2 L/day ? ? ? ? ?Jarome Matin, MS, RD, LDN ?Inpatient Clinical Dietitian ?RD pager # available in [place]  ?After hours/weekend pager #  available in [place] ? ?

## 2022-01-18 NOTE — Progress Notes (Signed)
?   01/18/22 1828  ?Vitals  ?Temp (!) 100.8 ?F (38.2 ?C)  ?MEWS COLOR  ?MEWS Score Color Yellow  ?MEWS Score  ?MEWS Temp 1  ?MEWS Systolic 0  ?MEWS Pulse 0  ?MEWS RR 1  ?MEWS LOC 0  ?MEWS Score 2  ? ?Patient is alert and oriented, not in distress. CN Abby notified. MD aware. PRN Tylenol given. Will monitor patient closely. ?

## 2022-01-19 DIAGNOSIS — E43 Unspecified severe protein-calorie malnutrition: Secondary | ICD-10-CM | POA: Diagnosis not present

## 2022-01-19 DIAGNOSIS — G8929 Other chronic pain: Secondary | ICD-10-CM | POA: Diagnosis not present

## 2022-01-19 DIAGNOSIS — E871 Hypo-osmolality and hyponatremia: Secondary | ICD-10-CM | POA: Diagnosis not present

## 2022-01-19 DIAGNOSIS — K30 Functional dyspepsia: Secondary | ICD-10-CM | POA: Diagnosis not present

## 2022-01-19 DIAGNOSIS — D500 Iron deficiency anemia secondary to blood loss (chronic): Secondary | ICD-10-CM | POA: Diagnosis not present

## 2022-01-19 DIAGNOSIS — A419 Sepsis, unspecified organism: Secondary | ICD-10-CM | POA: Diagnosis not present

## 2022-01-19 DIAGNOSIS — E876 Hypokalemia: Secondary | ICD-10-CM | POA: Diagnosis not present

## 2022-01-19 DIAGNOSIS — Z8269 Family history of other diseases of the musculoskeletal system and connective tissue: Secondary | ICD-10-CM | POA: Diagnosis not present

## 2022-01-19 DIAGNOSIS — N12 Tubulo-interstitial nephritis, not specified as acute or chronic: Secondary | ICD-10-CM | POA: Diagnosis not present

## 2022-01-19 DIAGNOSIS — R109 Unspecified abdominal pain: Secondary | ICD-10-CM | POA: Diagnosis not present

## 2022-01-19 LAB — IRON AND TIBC
Iron: 12 ug/dL — ABNORMAL LOW (ref 28–170)
Saturation Ratios: 6 % — ABNORMAL LOW (ref 10.4–31.8)
TIBC: 214 ug/dL — ABNORMAL LOW (ref 250–450)
UIBC: 202 ug/dL

## 2022-01-19 LAB — RETICULOCYTES
Immature Retic Fract: 12 % (ref 2.3–15.9)
RBC.: 3.31 MIL/uL — ABNORMAL LOW (ref 3.87–5.11)
Retic Count, Absolute: 36.7 10*3/uL (ref 19.0–186.0)
Retic Ct Pct: 1.1 % (ref 0.4–3.1)

## 2022-01-19 LAB — FOLATE: Folate: 16.1 ng/mL (ref >5.9)

## 2022-01-19 LAB — FERRITIN: Ferritin: 157 ng/mL (ref 11–307)

## 2022-01-19 LAB — GLUCOSE, CAPILLARY: Glucose-Capillary: 139 mg/dL — ABNORMAL HIGH (ref 70–99)

## 2022-01-19 LAB — VITAMIN B12: Vitamin B-12: 190 pg/mL (ref 180–914)

## 2022-01-19 MED ORDER — ENSURE ENLIVE PO LIQD
237.0000 mL | Freq: Four times a day (QID) | ORAL | Status: DC
  Administered 2022-01-19 – 2022-01-21 (×5): 237 mL via ORAL

## 2022-01-19 MED ORDER — HYDROCODONE-ACETAMINOPHEN 5-325 MG PO TABS
1.0000 | ORAL_TABLET | ORAL | Status: DC | PRN
  Administered 2022-01-19 – 2022-01-21 (×6): 1 via ORAL
  Filled 2022-01-19 (×6): qty 1

## 2022-01-19 MED ORDER — SODIUM CHLORIDE 0.9 % IV SOLN
500.0000 mg | Freq: Once | INTRAVENOUS | Status: AC
  Administered 2022-01-19: 10:00:00 500 mg via INTRAVENOUS
  Filled 2022-01-19: qty 25

## 2022-01-19 MED ORDER — CEFTRIAXONE SODIUM 2 G IJ SOLR
2.0000 g | INTRAMUSCULAR | Status: DC
Start: 2022-01-19 — End: 2022-01-20
  Administered 2022-01-19: 15:00:00 2 g via INTRAVENOUS
  Filled 2022-01-19 (×2): qty 20

## 2022-01-19 NOTE — Progress Notes (Signed)
NUTRITION NOTE ? ?Patient sitting up in the chair and mom was able to join on the phone during most of conversation. ? ?Patient shares that Ensure and juice is what she has been able to tolerate the best as with solid foods she is only able to get a few bites in. Will increase order for Ensure to QID. ? ?Shared with her result of anemia panel: iron of 12 (reference range: 28-170), TIBC: 214 (reference range: 250-450), and vitamin B12: 190 (reference range: 180-914).  ? ?Pending vitamin A, vitamin K, and thiamine. Will also check serum vitamin B6, zinc, and magnesium. Informed patient that RD will monitor for results and communicate with PCP if results come back after d/c. ? ?She shares that she was informed that d/c may occur over the weekend.  ? ?Further discussed J-tube and possibility of outpatient placement. Let her know that recommendations concerning formula and regimen outlined in RD note from 3/8. Patient asks about coverage by Medicaid; encouraged discussion with TOC for resources concerning information on this.  ? ?Patient shares that she has pins and needles type feeling in feet and legs and also random sharp, stabbing pain throughout body that occurs without known cause.  ? ?Patient inquires if RD can provide meal plan. She has not seen a RD outpatient in the past and shares that telehealth visits are most feasible for her.  ? ?Able to communicate with a RD at [facility] and confirmed that they accept Medicaid.  ? ?From discussion yesterday and left out of RD note in error: patient takes a gummy biotin supplement. She does not have bleeding to gums when brushing teeth. She does experience oral cold sensitivity.  ? ? ? ? ? ?Jarome Matin, MS, RD, LDN ?Inpatient Clinical Dietitian ?RD pager # available in [place]  ?After hours/weekend pager # available in [place] ? ?

## 2022-01-19 NOTE — Progress Notes (Signed)
?  Progress Note ? ? ?Patient: Rachel [name] BTD:[id number] DOB: Oct 18, 1986 DOA: 01/17/2022     1 ?DOS: the patient was seen and examined on 01/19/2022 ?  ?Brief hospital course: ?36 year old with past medical history significant for bipolar disorder, endometrial/dysfunctional uterine bleed status post hysterectomy with bilateral salpingo-oophorectomy comes in with a temperature of 104 fever nausea but no emesis was found to be in acute left pyelonephritis ? ?Assessment and Plan: ?* Pyelonephritis of left kidney ?Tmax 100.4, with a CT scan showing no evidence of left pyelonephritis. ?She was started on IV Rocephin she has defervesce. ?Continue IV Rocephin. ? ? ?Sepsis ([place]) ?Secondary to pyelonephritis. ?Blood cultures pending  and urine cultures are inconclusive. ?continue IV Rocephin. ? ? ?Delayed gastric emptying ?Patient has seen many specialist in the past, no current emesis at this point in time. ?Continue frequent small diet. ?-Dietitian consult ? ? ?Chronic abdominal pain ?Patient confirms current abdominal pain is chronic. ?-Continue ibuprofen PRN ?-Norco PRN for severe pain ? ?Acute hyponatremia ?Improved with fluid resuscitation. ? ?Dyspnea ?Satting greater than 100% on room air.  ?D-dimer unremarkable. ? ?Underweight ?Body mass index is 17.96 kg/m?[name] Kitchen  ?Delayed gastric emptying. ?Continue small frequent meals ? ?Vasomotor symptoms due to menopause ?History of hysterectomy with bilateral salpingo-oopherectomy. Patient is on Premarin daily ?-Continue Premarin ? ?Irritable bowel syndrome (IBS) ?Alternating constipation and diarrhea. ?Continue home Linzess, MiraLAX and Senokot-S ? ?Protein-calorie malnutrition, severe ?Ensure 3 times daily. ? ?Hypokalemia ?Potassium of 3.1 on admission BMP ?-Kdur 40 meq PO x2 ? ? ? ? ?  ? ?Subjective:  ?Relates her pain is now better continues to have fevers feels anorexic ? ?Physical Exam: ?Vitals:  ? 01/18/22 2316 01/19/22 0243 01/19/22 0359 01/19/22 0629  ?BP: (!) 101/55  109/63  (!) 106/58  ?Pulse: 77 75  74  ?Resp: '18 20  18  '$ ?Temp: 98.5 ?F (36.9 ?C) (!) 100.4 ?F (38 ?C) (!) 100.4 ?F (38 ?C) 99.2 ?F (37.3 ?C)  ?TempSrc: Oral Oral Oral Oral  ?SpO2: 95% 97%  95%  ?Weight:      ?Height:      ? ?General exam: In no acute distress. ?Respiratory system: Good air movement and clear to auscultation. ?Cardiovascular system: S1 & S2 heard, RRR. No JVD. ?Gastrointestinal system: Abdomen is nondistended, soft and nontender.  ?Extremities: No pedal edema. ?Skin: No rashes, lesions or ulcers ?Data Reviewed: ? ?Still has a leukocytosis and anemia on CBC, I have reviewed the basic metabolic panel and she is is hyponatremic, ? ?Family Communication: mother ? ?Disposition: ?Status is: Observation ?The patient will require care spanning > 2 midnights and should be moved to inpatient because: Due to acute pyelonephritis due to sepsis ? Planned Discharge Destination: Home ? ? ? ?Time spent: 35 minutes ? ?Author: ?Charlynne Cousins, MD ?01/19/2022 10:00 AM ? ?For on call review [email or web address].  ?
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
## 2022-01-19 NOTE — Assessment & Plan Note (Signed)
Ensure 3 times daily. ?

## 2022-01-20 ENCOUNTER — Inpatient Hospital Stay (HOSPITAL_COMMUNITY): Payer: Medicaid Other

## 2022-01-20 DIAGNOSIS — G8929 Other chronic pain: Secondary | ICD-10-CM | POA: Diagnosis not present

## 2022-01-20 DIAGNOSIS — E871 Hypo-osmolality and hyponatremia: Secondary | ICD-10-CM | POA: Diagnosis not present

## 2022-01-20 DIAGNOSIS — E876 Hypokalemia: Secondary | ICD-10-CM | POA: Diagnosis not present

## 2022-01-20 DIAGNOSIS — E43 Unspecified severe protein-calorie malnutrition: Secondary | ICD-10-CM | POA: Diagnosis not present

## 2022-01-20 DIAGNOSIS — R109 Unspecified abdominal pain: Secondary | ICD-10-CM | POA: Diagnosis not present

## 2022-01-20 DIAGNOSIS — D500 Iron deficiency anemia secondary to blood loss (chronic): Secondary | ICD-10-CM

## 2022-01-20 DIAGNOSIS — R609 Edema, unspecified: Secondary | ICD-10-CM | POA: Diagnosis not present

## 2022-01-20 DIAGNOSIS — D509 Iron deficiency anemia, unspecified: Secondary | ICD-10-CM

## 2022-01-20 DIAGNOSIS — K30 Functional dyspepsia: Secondary | ICD-10-CM | POA: Diagnosis not present

## 2022-01-20 DIAGNOSIS — A419 Sepsis, unspecified organism: Secondary | ICD-10-CM | POA: Diagnosis not present

## 2022-01-20 DIAGNOSIS — Z8269 Family history of other diseases of the musculoskeletal system and connective tissue: Secondary | ICD-10-CM | POA: Diagnosis not present

## 2022-01-20 DIAGNOSIS — N12 Tubulo-interstitial nephritis, not specified as acute or chronic: Secondary | ICD-10-CM | POA: Diagnosis not present

## 2022-01-20 LAB — CBC WITH DIFFERENTIAL/PLATELET
Abs Immature Granulocytes: 0.03 10*3/uL (ref 0.00–0.07)
Basophils Absolute: 0 10*3/uL (ref 0.0–0.1)
Basophils Relative: 0 %
Eosinophils Absolute: 0.4 10*3/uL (ref 0.0–0.5)
Eosinophils Relative: 6 %
HCT: 32.6 % — ABNORMAL LOW (ref 36.0–46.0)
Hemoglobin: 10.8 g/dL — ABNORMAL LOW (ref 12.0–15.0)
Immature Granulocytes: 1 %
Lymphocytes Relative: 20 %
Lymphs Abs: 1.3 10*3/uL (ref 0.7–4.0)
MCH: 31 pg (ref 26.0–34.0)
MCHC: 33.1 g/dL (ref 30.0–36.0)
MCV: 93.7 fL (ref 80.0–100.0)
Monocytes Absolute: 0.8 10*3/uL (ref 0.1–1.0)
Monocytes Relative: 12 %
Neutro Abs: 3.9 10*3/uL (ref 1.7–7.7)
Neutrophils Relative %: 61 %
Platelets: 185 10*3/uL (ref 150–400)
RBC: 3.48 MIL/uL — ABNORMAL LOW (ref 3.87–5.11)
RDW: 14.6 % (ref 11.5–15.5)
WBC: 6.4 10*3/uL (ref 4.0–10.5)
nRBC: 0 % (ref 0.0–0.2)

## 2022-01-20 LAB — RAPID URINE DRUG SCREEN, HOSP PERFORMED
Amphetamines: NOT DETECTED
Barbiturates: NOT DETECTED
Benzodiazepines: NOT DETECTED
Cocaine: NOT DETECTED
Opiates: POSITIVE — AB
Tetrahydrocannabinol: NOT DETECTED

## 2022-01-20 LAB — BASIC METABOLIC PANEL
Anion gap: 6 (ref 5–15)
BUN: 7 mg/dL (ref 6–20)
CO2: 30 mmol/L (ref 22–32)
Calcium: 9.1 mg/dL (ref 8.9–10.3)
Chloride: 101 mmol/L (ref 98–111)
Creatinine, Ser: 0.5 mg/dL (ref 0.44–1.00)
GFR, Estimated: >60 mL/min (ref >60)
Glucose, Bld: 88 mg/dL (ref 70–99)
Potassium: 3.8 mmol/L (ref 3.5–5.1)
Sodium: 137 mmol/L (ref 135–145)

## 2022-01-20 LAB — TSH: TSH: 1.701 u[IU]/mL (ref 0.350–4.500)

## 2022-01-20 LAB — MAGNESIUM: Magnesium: 2.1 mg/dL (ref 1.7–2.4)

## 2022-01-20 MED ORDER — ADULT MULTIVITAMIN LIQUID CH
15.0000 mL | Freq: Every day | ORAL | Status: DC
  Administered 2022-01-21: 15 mL via ORAL
  Filled 2022-01-20: qty 15

## 2022-01-20 MED ORDER — SODIUM CHLORIDE 0.9 % IV SOLN
2.0000 g | Freq: Once | INTRAVENOUS | Status: AC
  Administered 2022-01-20: 2 g via INTRAVENOUS
  Filled 2022-01-20: qty 2

## 2022-01-20 MED ORDER — CEFDINIR 300 MG PO CAPS
300.0000 mg | ORAL_CAPSULE | Freq: Two times a day (BID) | ORAL | Status: DC
  Administered 2022-01-21: 300 mg via ORAL
  Filled 2022-01-20: qty 1

## 2022-01-20 MED ORDER — SODIUM CHLORIDE 0.9 % IV SOLN
2.0000 g | Freq: Two times a day (BID) | INTRAVENOUS | Status: DC
  Filled 2022-01-20: qty 2

## 2022-01-20 MED ORDER — ESTROGENS CONJUGATED 0.45 MG PO TABS
0.4500 mg | ORAL_TABLET | Freq: Every day | ORAL | Status: DC
Start: 2022-01-20 — End: 2022-01-21
  Administered 2022-01-20: 0.45 mg via ORAL

## 2022-01-20 MED ORDER — SULFAMETHOXAZOLE-TRIMETHOPRIM 800-160 MG PO TABS: 1.0000 | ORAL_TABLET | Freq: Two times a day (BID) | ORAL | Status: DC

## 2022-01-20 MED ORDER — DIPHENHYDRAMINE-ZINC ACETATE 2-0.1 % EX CREA
TOPICAL_CREAM | Freq: Once | CUTANEOUS | Status: AC
Start: 2022-01-20 — End: 2022-01-21
  Administered 2022-01-21: 1 via TOPICAL
  Filled 2022-01-20: qty 28

## 2022-01-20 MED ORDER — CEFEPIME HCL 1 G IJ SOLR
1.0000 g | Freq: Two times a day (BID) | INTRAMUSCULAR | Status: DC
  Administered 2022-01-20: 1 g via INTRAVENOUS
  Filled 2022-01-20 (×2): qty 1

## 2022-01-20 NOTE — Progress Notes (Signed)
?  Progress Note ? ? ?Patient: Rachel [name] TWS:[id number] DOB: 17-Mar-1986 DOA: 01/17/2022     2 ?DOS: the patient was seen and examined on 01/20/2022 ?  ?Brief hospital course: ?36 year old with past medical history significant for bipolar disorder, endometrial/dysfunctional uterine bleed status post hysterectomy with bilateral salpingo-oophorectomy comes in with a temperature of 104 fever nausea but no emesis was found to be in acute left pyelonephritis ? ?Assessment and Plan: ?* Pyelonephritis of left kidney ?Tmax of 101.1, CT scan showed left pyelonephritis. ?We will transition her to IV cefepime. ?Check a CBC with differential.  Recheck blood cultures ?Check a lower extremity Doppler to rule out DVT. ?CBC with differential is pending this morning. ?HIV is negative SARS-CoV-2  & influenza PCR negative ? ?Sepsis ([place]) ?Secondary to pyelonephritis. ?Blood cultures have been negative till date, urine cultures were inconclusive. ?We will transition to IV cefepime. ? ? ?Delayed gastric emptying ?Patient has seen many specialist in the past, no current emesis at this point in time. ?Continue frequent small diet. ?-Dietitian consult ?Pending vitamin a, K, thiamine, B6, zinc and magnesium. ? ? ?Chronic abdominal pain ?Patient confirms current abdominal pain is chronic. ?-Continue ibuprofen PRN ?-Norco PRN for severe pain ? ?Acute hyponatremia ?Improved with fluid resuscitation. ? ?Dyspnea ?Satting greater than 100% on room air.  ?D-dimer unremarkable. ? ?Underweight ?Body mass index is 17.96 kg/m?[name] Kitchen  ?Delayed gastric emptying. ?Continue small frequent meals ? ?Vasomotor symptoms due to menopause ?History of hysterectomy with bilateral salpingo-oopherectomy. Patient is on Premarin daily ?-Continue Premarin ? ?Irritable bowel syndrome (IBS) ?Alternating constipation and diarrhea. ?Continue home Linzess, MiraLAX and Senokot-S ? ?Iron deficiency anemia ?Ferritin is low she was given IV iron. ?Will need to follow-up with PCP  as an outpatient. ? ?Protein-calorie malnutrition, severe ?Ensure 3 times daily. ? ?Hypokalemia ?Repleted orally now resolved. ? ? ? ? ?  ? ?Subjective:  ?Sleepy this morning feels tired. ? ?Physical Exam: ?Vitals:  ? 01/19/22 1821 01/19/22 2042 01/19/22 2155 01/20/22 0525  ?BP: 108/75 116/74  108/72  ?Pulse: 88 88  60  ?Resp: '18 20  18  '$ ?Temp: 99.9 ?F (37.7 ?C) (!) 101.1 ?F (38.4 ?C) (!) 100.8 ?F (38.2 ?C) 98.8 ?F (37.1 ?C)  ?TempSrc:  Oral Oral Oral  ?SpO2: 100% 98%  99%  ?Weight:      ?Height:      ? ?General exam: In no acute distress, cachectic ?Respiratory system: Good air movement and clear to auscultation. ?Cardiovascular system: S1 & S2 heard, RRR. No JVD. ?Gastrointestinal system: Abdomen is nondistended, soft and nontender.  ?Extremities: No pedal edema. ?Skin: No rashes, lesions or ulcers ?Psychiatry: Judgement and insight appear normal. Mood & affect appropriate. ?Data Reviewed: ? ?Still has a leukocytosis and anemia on CBC, I have reviewed the basic metabolic panel and she is is hyponatremic, ? ?Family Communication: mother ? ?Disposition: ?Status is: Observation ?The patient will require care spanning > 2 midnights and should be moved to inpatient because: Due to acute pyelonephritis due to sepsis ? Planned Discharge Destination: Home ? ? ? ?Time spent: 35 minutes ? ?Author: ?Charlynne Cousins, MD ?01/20/2022 9:32 AM ? ?For on call review [email or web address]  ?

## 2022-01-20 NOTE — Assessment & Plan Note (Signed)
Ferritin is low she was given IV iron. ?Will need to follow-up with PCP as an outpatient. ?

## 2022-01-20 NOTE — Progress Notes (Signed)
Bilateral lower extremity venous duplex has been completed. ?Preliminary results can be found in CV Proc through chart review.  ? ?01/20/22 1:27 PM ?Carlos Levering RVT   ?

## 2022-01-20 NOTE — Consult Note (Addendum)
[facility]    Date of Admission:  01/17/2022   Total days of inpatient antibiotics 2        Reason for Consult: Fever    Principal Problem:   Pyelonephritis of left kidney Active Problems:   Delayed gastric emptying   Vasomotor symptoms due to menopause   Sepsis (HCC)   Chronic abdominal pain   Dyspnea   Underweight   Irritable bowel syndrome (IBS)   Hypokalemia   Acute hyponatremia   Protein-calorie malnutrition, severe   Iron deficiency anemia   Assessment: 35YF of BPPD, multiple ED visits for URI symptoms admitted for sepsis 2/2 pyelonephritis. CT showed signs of left sided pyelonephritis. Urine Cx wit h insignificant growth. ID consulted as pt continued to fever during admission and 5 year history of intermittent fevers.  #Pyelonephritis #Reported fever x 5 years -Multiple ED visit for URI, negative Flu and COVID. Last ED visit with URI symptoms on 3/7 -Seen by ID in the past for a suspected parasite infection(stool ova parasite negative). Pt had seen a worm followed by eggs in her stool. Followed by GI, managed for nausea, constipation abdominal pain on linzess. Pt is concerned about weight loss(99lb on 01/1421, 103lb on 01/17/22). -Last seen by Rheumatology on 07/29/20(Dr. Deveshwar). Per documentation pt had concerns she may have Ehlers-danlos. Diagnosed with myofacial pain and hypermobility arthralgia. Reccommended physical therapy and isometric exercise. Noted family Hx of fibromyalgia(mother).  -On review of EMR([facility] system) for the past 10 years  she had documented fever since 100.4> 3/7(on 2/3 she had temp of 100.2 ED visit for flu like symptoms). Pt reported she has had intermittent fevers for the past five years occuring every few days. Mother reports pt had "multiple infections" as a child and was worked up by an Horticulturist, commercial.  -TSH normal, UDS + opiates Recommendations:  -D/C cefepime -Start ceftriaxone -Transition ceftriaxone to  omnicef tomorrow(3/11) complete 2 weeks for complicated UTI(EOT 6/43). Leukocytosis resolved and fever curve trending down. -Per 5 year fever history: counseled pt  to maintain symptoms and fever diary x 1 month, bring immunology and additional rheumatology work-up to ID clinic in one month for work-up of reported FUO. Microbiology:   Antibiotics: Ciprofloxacin 3/7-3.8 Ceftriaxone 3/8-9 Cefepime 3/10 Cultures: Blood 3/7 NG 3/10 Urine 3/7 insignificant growth   HPI: Rachel [name] is a 36 y.o. female  with bipolar disorder, anxiety, asthma, GERD, endometriosis/dysfunctional uterine bleed SP hysterectomy with b/l salpingo-oophorectomy admitted for pyelonephritis.  Pt temp of 103, wbc 14K. CT showed early left  pyelonephritis, urine Cx with insignificant growth.  Started on cipro and transitioned to ceftriaxone. She had tmax 101.1 in the past 24hr and transitioned to cefepime.  Of note, she had been seen in the ED urgent care with fever, cough sore throat x 2 days. She was referred to go to [facility] ED for further evaluation. Mother present at bedside during visit.  Review of Systems: Review of Systems  All other systems reviewed and are negative.  Past Medical History:  Diagnosis Date   ADHD (attention deficit hyperactivity disorder)    Anemia    Anxiety    Asthma    Bipolar disorder ([place])    Complication of anesthesia    slow to wake up    Delayed gastric emptying 02/23/2020   Depression    DUB (dysfunctional uterine bleeding) 02/23/2020   Endometriosis    GERD (gastroesophageal reflux disease)    Heart murmur  Hiatal hernia 04/06/2020   Myalgia 04/06/2020   Parasitic infection 02/23/2020   Pneumonia    Poor appetite 02/23/2020   RUQ pain 02/23/2020   Vitamin D deficiency    Weight loss 02/23/2020    Social History   Tobacco Use   Smoking status: Never   Smokeless tobacco: Never  Vaping Use   Vaping Use: Never used  Substance Use Topics   Alcohol use:  Yes    Alcohol/week: 1.0 standard drink    Types: 1 Glasses of wine per week    Comment: 1 glass of wine per week   Drug use: No    Family History  Problem Relation Age of Onset   Colon polyps Mother    Depression Mother    Anxiety disorder Mother    Hypertension Mother    Hyperlipidemia Mother    Asthma Mother    Endometriosis Mother    Fibroids Mother    Fibromyalgia Mother    Migraines Mother    Irritable bowel syndrome Mother    Depression Father    Hypertension Father    Hyperlipidemia Father    Bipolar disorder Father    Bipolar disorder Sister    ADD / ADHD Sister    Asthma Sister    Endometriosis Sister    Seizures Sister    Bipolar disorder Sister    Anxiety disorder Sister    Heart disease Maternal Grandmother    Endometriosis Maternal Grandmother    Fibroids Maternal Grandmother    Hyperlipidemia Maternal Grandmother    Hypertension Maternal Grandmother    Diabetes Maternal Grandmother    Fibromyalgia Maternal Grandmother    Heart disease Maternal Grandfather    Diabetes Maternal Grandfather    Heart disease Paternal Grandmother    Heart disease Paternal Grandfather    Colon cancer Maternal Great-grandmother    Pancreatic cancer Neg Hx    Esophageal cancer Neg Hx    Stomach cancer Neg Hx    Scheduled Meds:  (feeding supplement) PROSource Plus  30 mL Oral Daily   vitamin C  1,000 mg Oral BID   cholecalciferol  2,000 Units Oral Daily   enoxaparin (LOVENOX) injection  40 mg Subcutaneous Q24H   estrogens (conjugated)  0.45 mg Oral Daily   feeding supplement  237 mL Oral QID   linaclotide  290 mcg Oral QAC breakfast   loratadine  10 mg Oral Daily   melatonin  10 mg Oral QHS   multivitamin  15 mL Per Tube Daily   polyethylene glycol  17 g Oral Daily   senna-docusate  2 tablet Oral Daily   Continuous Infusions:  ceFEPime (MAXIPIME) IV 1 g (01/20/22 0953)   lactated ringers 100 mL/hr at 01/19/22 2122   PRN Meds:.acetaminophen, albuterol, albuterol,  diphenhydrAMINE, HYDROcodone-acetaminophen, hydrOXYzine, ibuprofen, zolpidem Allergies  Allergen Reactions   Amphetamine-Dextroamphetamine Other (See Comments)    Triggered bipolar, caused depression, and suicidal thoughts   Lupron [Leuprolide] Anaphylaxis   Orilissa [Elagolix] Anaphylaxis   Penicillins Anaphylaxis, Swelling and Other (See Comments)    Potential for anaphylaxis confirmed. Tolerates Cefphalosporins No "-cillins"!!!! Did it involve swelling of the face/tongue/throat, SOB, or low BP? Yes Did it involve sudden or severe rash/hives, skin peeling, or any reaction on the inside of your mouth or nose? Unknown Did you need to seek medical attention at a hospital or doctor's office? Unknown When did it last happen? teenager      If all above answers are "NO", may proceed with cephalosporin use.  Chlorhexidine Hives    DuraPrep    Doxycycline Itching, Nausea And Vomiting and Other (See Comments)    Caused high fever, also   Olanzapine Other (See Comments)    Caused mania   Other Other (See Comments)    Stimulants, mood stabilizers, and antidepressants = Triggered bipolar, caused depression, and suicidal thoughts   Prozac [Fluoxetine Hcl] Other (See Comments)    Triggered bipolar, caused depression, and suicidal thoughts   Tape Other (See Comments)    Paper tape is tolerated   Triple Antibiotic Pain Relief [Neomy-Bacit-Polymyx-Pramoxine] Hives   Reglan [Metoclopramide] Anxiety and Other (See Comments)    Triggered bipolar, caused depression, suicidal thoughts, muscle twitching, stiffness, and high anxiety     OBJECTIVE: Blood pressure 117/70, pulse 64, temperature 100.1 F (37.8 C), temperature source Oral, resp. rate 20, height 5' 2.5" (1.588 m), weight 46.8 kg, SpO2 99 %.  Physical Exam Constitutional:      Appearance: Normal appearance.  HENT:     Head: Normocephalic and atraumatic.     Right Ear: Tympanic membrane normal.     Left Ear: Tympanic membrane normal.      Nose: Nose normal.     Mouth/Throat:     Mouth: Mucous membranes are moist.  Eyes:     Extraocular Movements: Extraocular movements intact.     Conjunctiva/sclera: Conjunctivae normal.     Pupils: Pupils are equal, round, and reactive to light.  Cardiovascular:     Rate and Rhythm: Normal rate and regular rhythm.     Heart sounds: No murmur heard.   No friction rub. No gallop.  Pulmonary:     Effort: Pulmonary effort is normal.     Breath sounds: Normal breath sounds.  Abdominal:     General: Abdomen is flat.     Palpations: Abdomen is soft.  Musculoskeletal:        General: Normal range of motion.  Skin:    General: Skin is warm and dry.  Neurological:     General: No focal deficit present.     Mental Status: She is alert and oriented to person, place, and time.  Psychiatric:        Mood and Affect: Mood normal.    Lab Results Lab Results  Component Value Date   WBC 6.4 01/20/2022   HGB 10.8 (L) 01/20/2022   HCT 32.6 (L) 01/20/2022   MCV 93.7 01/20/2022   PLT 185 01/20/2022    Lab Results  Component Value Date   CREATININE 0.50 01/20/2022   BUN 7 01/20/2022   NA 137 01/20/2022   K 3.8 01/20/2022   CL 101 01/20/2022   CO2 30 01/20/2022    Lab Results  Component Value Date   ALT 8 01/18/2022   AST 13 (L) 01/18/2022   ALKPHOS 44 01/18/2022   BILITOT 0.4 01/18/2022       Laurice Record, MD [facility] 01/20/2022, 10:32 AM

## 2022-01-20 NOTE — Progress Notes (Signed)
NUTRITION NOTE ? ?Patient laying in bed and reports being very tired and weak this AM. ? ?Shared with her this RD's discussion with outpatient RD at [facility] located on [address]. They are able to take Medicaid and do telehealth visits and will gladly assist patient outpatient following this hospitalization.  ? ?Will place referral.  ? ?Serum Mg came back and is 2.1 mg/dl. ? ?Still pending: vitamin A, vitamin B6, vitamin K, thiamine, and zinc. ? ? ? ? ? ?Jarome Matin, MS, RD, LDN ?Inpatient Clinical Dietitian ?RD pager # available in [place]  ?After hours/weekend pager # available in [place] ? ?

## 2022-01-21 DIAGNOSIS — N12 Tubulo-interstitial nephritis, not specified as acute or chronic: Secondary | ICD-10-CM | POA: Diagnosis not present

## 2022-01-21 DIAGNOSIS — Z8269 Family history of other diseases of the musculoskeletal system and connective tissue: Secondary | ICD-10-CM

## 2022-01-21 DIAGNOSIS — E871 Hypo-osmolality and hyponatremia: Secondary | ICD-10-CM | POA: Diagnosis not present

## 2022-01-21 DIAGNOSIS — G8929 Other chronic pain: Secondary | ICD-10-CM | POA: Diagnosis not present

## 2022-01-21 DIAGNOSIS — E43 Unspecified severe protein-calorie malnutrition: Secondary | ICD-10-CM | POA: Diagnosis not present

## 2022-01-21 DIAGNOSIS — K30 Functional dyspepsia: Secondary | ICD-10-CM | POA: Diagnosis not present

## 2022-01-21 DIAGNOSIS — A419 Sepsis, unspecified organism: Secondary | ICD-10-CM | POA: Diagnosis not present

## 2022-01-21 DIAGNOSIS — R109 Unspecified abdominal pain: Secondary | ICD-10-CM | POA: Diagnosis not present

## 2022-01-21 DIAGNOSIS — E876 Hypokalemia: Secondary | ICD-10-CM | POA: Diagnosis not present

## 2022-01-21 DIAGNOSIS — D500 Iron deficiency anemia secondary to blood loss (chronic): Secondary | ICD-10-CM | POA: Diagnosis not present

## 2022-01-21 LAB — VITAMIN B1: Vitamin B1 (Thiamine): 114 nmol/L (ref 66.5–200.0)

## 2022-01-21 MED ORDER — CEFDINIR 300 MG PO CAPS: 300.0000 mg | ORAL_CAPSULE | Freq: Two times a day (BID) | ORAL | 0 refills | Status: AC

## 2022-01-21 NOTE — TOC Transition Note (Signed)
Transition of Care (TOC) - CM/SW Discharge Note ? ? ?Patient Details  ?Name: Rachel [name] ?MRN: [id number] ?Date of Birth: Oct 26, 1986 ? ?Transition of Care (TOC) CM/SW Contact:  ?Ross Ludwig, LCSW ?Phone Number: ?01/21/2022, 11:48 AM ? ? ?Clinical Narrative:    ? ?CSW received consult that patient needed assistance with OTC medications.  CSW recommended that she try going to one of the [facility] outpatient pharmacies, because they have OTC meds which are very inexpensive. Patient has Medicaid for prescriptions, CSW informed bedside nurse that her prescriptions should be very inexpensive as well.  CSW signing off, please reconsult if other TOC needs arise. ? ?  ?  ? ? ?Patient Goals and CMS Choice ?  ?  ?  ? ?Discharge Placement ?  ?           ?  ?  ?  ?  ? ?Discharge Plan and Services ?  ?  ?           ?  ?  ?  ?  ?  ?  ?  ?  ?  ?  ? ?Social Determinants of Health (SDOH) Interventions ?  ? ? ?Readmission Risk Interventions ?No flowsheet data found. ? ? ? ? ?

## 2022-01-21 NOTE — Discharge Summary (Signed)
Physician Discharge Summary  Rachel [name] ZDG:[id number] DOB: 1986/03/21 DOA: 01/17/2022  PCP: Charlott Rakes, MD  Admit date: 01/17/2022 Discharge date: 01/21/2022  Admitted From: Home Disposition:  Home  Recommendations for Outpatient Follow-up:  Follow up with PCP in 1-2 weeks Please obtain BMP/CBC in one week   Home Health:No Equipment/Devices:None  Discharge Condition:Stable CODE STATUS:Full Diet recommendation: Regular diet  Brief/Interim Summary: 36 year old with past medical history significant for bipolar disorder, endometrial/dysfunctional uterine bleed status post hysterectomy with bilateral salpingo-oophorectomy comes in with a temperature of 104 fever nausea but no emesis was found to be in acute left pyelonephritis  Discharge Diagnoses:  Principal Problem:   Pyelonephritis of left kidney Active Problems:   Sepsis ([place])   Delayed gastric emptying   Chronic abdominal pain   Dyspnea   Acute hyponatremia   Underweight   Vasomotor symptoms due to menopause   Irritable bowel syndrome (IBS)   Hypokalemia   Protein-calorie malnutrition, severe   Iron deficiency anemia  Acute pyelonephritis of the left kidney: She started having fevers, she was placed on IV Rocephin urine cultures were inconclusive. She defervesced leukocytosis improved lower extremity Doppler was negative for DVT the mother wanted infectious disease consulted which agree with management continue antibiotics for 14 days. *HIV SARS-CoV-2 and influenza PCR were negative. She will continue Omnicef for a total of 14 days as an outpatient.  Sepsis: Resolved likely due to pyelonephritis.  Delayed gastric emptying: She will follow-up with nutrition as an outpatient.  Chronic abdominal pain: Which is admitted with her medication.  Acute hypovolemic hyponatremia: Resolved with IV fluid resuscitation.  Dyspnea: Resolved, satting greater 100% on room air.  Underweight: With a BMI less of 18  she has delayed gastric emptying study should continue frequent small meals. She will follow-up with nutrition as an outpatient.  Vasomotor symptoms due to menopause: Continue Premarin.  Irritable bowel syndrome: No changes made continue [place] MiraLAX and Senokot.  Iron deficiency anemia: Her ferritin was low she was given IV iron she will follow-up with PCP as an outpatient.  Severe protein caloric malnutrition: Ensure 3 times daily. Question anorexia.  Hypokalemia: Replete orally now resolved.  Discharge Instructions  Discharge Instructions     Amb Referral to Nutrition and Diabetic Education   Complete by: As directed    Diet - low sodium heart healthy   Complete by: As directed    Increase activity slowly   Complete by: As directed       Allergies as of 01/21/2022       Reactions   Amphetamine-dextroamphetamine Other (See Comments)   Triggered bipolar, caused depression, and suicidal thoughts   Lupron [leuprolide] Anaphylaxis   Orilissa [elagolix] Anaphylaxis   Penicillins Anaphylaxis, Swelling, Other (See Comments)   Potential for anaphylaxis confirmed. Tolerates Cefphalosporins No "-cillins"!!!! Did it involve swelling of the face/tongue/throat, SOB, or low BP? Yes Did it involve sudden or severe rash/hives, skin peeling, or any reaction on the inside of your mouth or nose? Unknown Did you need to seek medical attention at a hospital or doctor's office? Unknown When did it last happen? teenager      If all above answers are "NO", may proceed with cephalosporin use.   Chlorhexidine Hives   DuraPrep    Doxycycline Itching, Nausea And Vomiting, Other (See Comments)   Caused high fever, also   Olanzapine Other (See Comments)   Caused mania   Other Other (See Comments)   Stimulants, mood stabilizers, and antidepressants = Triggered bipolar, caused depression, and  suicidal thoughts   Prozac [fluoxetine Hcl] Other (See Comments)   Triggered bipolar, caused  depression, and suicidal thoughts   Tape Other (See Comments)   Paper tape is tolerated   Triple Antibiotic Pain Relief [neomy-bacit-polymyx-pramoxine] Hives   Reglan [metoclopramide] Anxiety, Other (See Comments)   Triggered bipolar, caused depression, suicidal thoughts, muscle twitching, stiffness, and high anxiety         Medication List     TAKE these medications    ADULT ONE DAILY GUMMIES PO Take 2 tablets by mouth daily.   albuterol 108 (90 Base) MCG/ACT inhaler Commonly known as: VENTOLIN HFA Inhale 1-2 puffs into the lungs every 6 (six) hours as needed for wheezing or shortness of breath.   albuterol (2.5 MG/3ML) 0.083% nebulizer solution Commonly known as: PROVENTIL Take 3 mLs (2.5 mg total) by nebulization every 6 (six) hours as needed for wheezing or shortness of breath. Max 4 doses per day   Calcium-Magnesium-Vitamin D 600-40-500 MG-MG-UNIT Tb24 Take 3 tablets by mouth in the morning and at bedtime.   cefdinir 300 MG capsule Commonly known as: OMNICEF Take 1 capsule (300 mg total) by mouth 2 (two) times daily for 10 days.   ELDERBERRY PO Take 1 tablet by mouth daily.   EPINEPHrine 0.3 mg/0.3 mL Soaj injection Commonly known as: EPI-PEN Inject 0.3 mg into the muscle as needed for anaphylaxis.   estrogens (conjugated) 0.45 MG tablet Commonly known as: Premarin Take 1 tablet (0.45 mg total) by mouth daily.   fexofenadine 180 MG tablet Commonly known as: ALLEGRA Take 180 mg by mouth daily.   Fish Oil 1200 MG Caps Take 1,200 mg by mouth daily.   ibuprofen 600 MG tablet Commonly known as: ADVIL Take 1 tablet (600 mg total) by mouth every 6 (six) hours as needed for headache, mild pain, moderate pain or cramping.   linaclotide 290 MCG Caps capsule Commonly known as: Linzess Take 1 capsule (290 mcg total) by mouth daily before breakfast.   Melatonin 10 MG Tabs Take 10 mg by mouth at bedtime.   MiraLax 17 GM/SCOOP powder Generic drug: polyethylene  glycol powder Take 17 g by mouth See admin instructions. Mix 17 g of powder into water and drink once a day   promethazine 12.5 MG tablet Commonly known as: PHENERGAN Take 12.5 mg by mouth See admin instructions. Take 12.5 mg by mouth every 6-8 hours as needed for nausea   sennosides-docusate sodium 8.6-50 MG tablet Commonly known as: SENOKOT-S Take 1-2 tablets by mouth daily.   vitamin C 1000 MG tablet Take 1,000 mg by mouth 2 (two) times daily.   Vitamin D3 50 MCG (2000 UT) Tabs Take 2,000 Units by mouth daily.        Allergies  Allergen Reactions   Amphetamine-Dextroamphetamine Other (See Comments)    Triggered bipolar, caused depression, and suicidal thoughts   Lupron [Leuprolide] Anaphylaxis   Orilissa [Elagolix] Anaphylaxis   Penicillins Anaphylaxis, Swelling and Other (See Comments)    Potential for anaphylaxis confirmed. Tolerates Cefphalosporins No "-cillins"!!!! Did it involve swelling of the face/tongue/throat, SOB, or low BP? Yes Did it involve sudden or severe rash/hives, skin peeling, or any reaction on the inside of your mouth or nose? Unknown Did you need to seek medical attention at a hospital or doctor's office? Unknown When did it last happen? teenager      If all above answers are "NO", may proceed with cephalosporin use.   Chlorhexidine Hives    DuraPrep    Doxycycline  Itching, Nausea And Vomiting and Other (See Comments)    Caused high fever, also   Olanzapine Other (See Comments)    Caused mania   Other Other (See Comments)    Stimulants, mood stabilizers, and antidepressants = Triggered bipolar, caused depression, and suicidal thoughts   Prozac [Fluoxetine Hcl] Other (See Comments)    Triggered bipolar, caused depression, and suicidal thoughts   Tape Other (See Comments)    Paper tape is tolerated   Triple Antibiotic Pain Relief [Neomy-Bacit-Polymyx-Pramoxine] Hives   Reglan [Metoclopramide] Anxiety and Other (See Comments)    Triggered  bipolar, caused depression, suicidal thoughts, muscle twitching, stiffness, and high anxiety     Consultations: Infectious disease   Procedures/Studies: DG Chest 2 View  Result Date: 01/17/2022 CLINICAL DATA:  36 year old female with history of cough. EXAM: CHEST - 2 VIEW COMPARISON:  Chest x-ray 09/29/2019. FINDINGS: Lung volumes are normal. Mild elevation of the left hemidiaphragm. No consolidative airspace disease. No pleural effusions. No pneumothorax. No pulmonary nodule or mass noted. Pulmonary vasculature and the cardiomediastinal silhouette are within normal limits. IMPRESSION: No radiographic evidence of acute cardiopulmonary disease. Electronically Signed   By: [name] M.D.   On: 01/17/2022 09:42   CT ABDOMEN PELVIS W CONTRAST  Result Date: 01/17/2022 CLINICAL DATA:  Fever, cough, shortness of breath for 2 days. Hypokalemia. Leukocytosis. EXAM: CT ABDOMEN AND PELVIS WITH CONTRAST TECHNIQUE: Multidetector CT imaging of the abdomen and pelvis was performed using the standard protocol following bolus administration of intravenous contrast. RADIATION DOSE REDUCTION: This exam was performed according to the departmental dose-optimization program which includes automated exposure control, adjustment of the mA and/or kV according to patient size and/or use of iterative reconstruction technique. CONTRAST:  58m OMNIPAQUE IOHEXOL 300 MG/ML  SOLN COMPARISON:  05/26/2021 FINDINGS: Lower chest: Unremarkable Hepatobiliary: Unremarkable Pancreas: Unremarkable Spleen: Unremarkable Adrenals/Urinary Tract: New abnormal hypoenhancing 2.1 by 1.6 by 1.2 cm segment of the left kidney upper pole, image 69 series 5. No cortical rim sign appreciable on CT. Additional smaller cortical areas of striated hypodensity posteriorly in the right kidney upper pole on image 31 series 2. The right kidney appears normal. No hydronephrosis or hydroureter. Urinary bladder unremarkable. Adrenal glands appear normal.  Stomach/Bowel: Air-fluid levels are present in the distal colon favoring diarrheal process. Mild gaseous prominence of the colon. No pneumatosis or portal venous gas. No substantially dilated small bowel. Appendix poorly seen. Vascular/Lymphatic: Unremarkable Reproductive: Uterus absent.  Adnexa unremarkable. Other: No supplemental non-categorized findings. Musculoskeletal: Unremarkable IMPRESSION: 1. New abnormal hypoenhancing regions in the left kidney upper pole. Given the clinical context, the appearance favors acute left renal pyelonephritis. 2. Air fluid levels in the distal colon compatible with diarrheal process. Mild gaseous prominence of the colon. Electronically Signed   By: WVan ClinesM.D.   On: 01/17/2022 13:08   VAS [place]OWER EXTREMITY VENOUS (DVT)  Result Date: 01/20/2022  Lower Venous DVT Study Patient Name:  Rachel [name] Date of Exam:   01/20/2022 Medical Rec #: [id number]           Accession #:    [id number] of Birth: [birth year]            Patient Gender: F Patient Age:   89years [facility]      VAS [place]OWER EXTREMITY VENOUS (DVT) Referring Phys: ABess HarvestORTIZ --------------------------------------------------------------------------------  Indications: Edema.  Risk Factors: None identified. Comparison Study: No prior studies. Performing Technologist: GOliver HumRVT  Examination Guidelines: A complete evaluation includes B-mode imaging, spectral Doppler, color Doppler, and power Doppler as needed of all accessible portions of each vessel. Bilateral testing is considered an integral part of a complete examination. Limited examinations for reoccurring indications may be performed as noted. The reflux portion of the exam is performed with the patient in reverse Trendelenburg.  +---------+---------------+---------+-----------+----------+--------------+  RIGHT     Compressibility Phasicity Spontaneity Properties Thrombus Aging   +---------+---------------+---------+-----------+----------+--------------+  CFV       Full            Yes       Yes                                    +---------+---------------+---------+-----------+----------+--------------+  SFJ       Full                                                             +---------+---------------+---------+-----------+----------+--------------+  FV Prox   Full                                                             +---------+---------------+---------+-----------+----------+--------------+  FV Mid    Full                                                             +---------+---------------+---------+-----------+----------+--------------+  FV Distal Full                                                             +---------+---------------+---------+-----------+----------+--------------+  PFV       Full                                                             +---------+---------------+---------+-----------+----------+--------------+  POP       Full            Yes       Yes                                    +---------+---------------+---------+-----------+----------+--------------+  PTV       Full                                                             +---------+---------------+---------+-----------+----------+--------------+  PERO      Full                                                             +---------+---------------+---------+-----------+----------+--------------+   +---------+---------------+---------+-----------+----------+--------------+  LEFT      Compressibility Phasicity Spontaneity Properties Thrombus Aging  +---------+---------------+---------+-----------+----------+--------------+  CFV       Full            Yes       Yes                                    +---------+---------------+---------+-----------+----------+--------------+  SFJ       Full                                                              +---------+---------------+---------+-----------+----------+--------------+  FV Prox   Full                                                             +---------+---------------+---------+-----------+----------+--------------+  FV Mid    Full                                                             +---------+---------------+---------+-----------+----------+--------------+  FV Distal Full                                                             +---------+---------------+---------+-----------+----------+--------------+  PFV       Full                                                             +---------+---------------+---------+-----------+----------+--------------+  POP       Full            Yes       Yes                                    +---------+---------------+---------+-----------+----------+--------------+  PTV       Full                                                             +---------+---------------+---------+-----------+----------+--------------+  PERO      Full                                                             +---------+---------------+---------+-----------+----------+--------------+     Summary: RIGHT: - There is no evidence of deep vein thrombosis in the lower extremity.  - No cystic structure found in the popliteal fossa.  LEFT: - There is no evidence of deep vein thrombosis in the lower extremity.  - No cystic structure found in the popliteal fossa.  *See table(s) above for measurements and observations. Electronically signed by Monica Martinez MD on 01/20/2022 at 5:19:53 PM.    Final    (Echo, Carotid, EGD, Colonoscopy, ERCP)    Subjective: No complaints  Discharge Exam: Vitals:   01/21/22 0530 01/21/22 0919  BP: 105/72 111/77  Pulse: 65 79  Resp: 16 18  Temp: 98.3 F (36.8 C) 99.3 F (37.4 C)  SpO2: 98% 98%   Vitals:   01/20/22 1402 01/20/22 2129 01/21/22 0530 01/21/22 0919  BP: 103/74 103/70 105/72 111/77  Pulse: 80 82 65 79  Resp: '20 20 16 18   '$ Temp: 98.9 F (37.2 C) 98.7 F (37.1 C) 98.3 F (36.8 C) 99.3 F (37.4 C)  TempSrc: Oral Oral  Oral  SpO2: 97% 98% 98% 98%  Weight:      Height:        General: Pt is alert, awake, not in acute distress Cardiovascular: RRR, S1/S2 +, no rubs, no gallops Respiratory: CTA bilaterally, no wheezing, no rhonchi Abdominal: Soft, NT, ND, bowel sounds + Extremities: no edema, no cyanosis    The results of significant diagnostics from this hospitalization (including imaging, microbiology, ancillary and laboratory) are listed below for reference.     Microbiology: Recent Results (from the past 240 hour(s))  Blood culture (routine x 2)     Status: None (Preliminary result)   Collection Time: 01/17/22 11:50 AM   Specimen: Left Antecubital; Blood  Result Value Ref Range Status   Specimen Description   Final    LEFT ANTECUBITAL Performed at [facility], [place] [address], [place], [place] [address]    Special Requests   Final    BOTTLES DRAWN AEROBIC AND ANAEROBIC Blood Culture adequate volume Performed at [facility] [address], [place], [place] [address]    Culture   Final    NO GROWTH 4 DAYS Performed at [facility], [place] [address], [place], [place] [address]    Report Status PENDING  Incomplete  Urine Culture     Status: Abnormal   Collection Time: 01/17/22  1:42 PM   Specimen: Urine, Clean Catch  Result Value Ref Range Status   Specimen Description   Final    URINE, CLEAN CATCH Performed at [facility], [place] [address] [place], [place] [address]    Special Requests   Final    NONE Performed at [facility], [place] [address] [place], [place] [address]    Culture (A)  Final    <10,000 COLONIES/mL INSIGNIFICANT GROWTH Performed at [facility] [address], [place], [place] [address]    Report Status 01/18/2022 FINAL  Final  Blood culture (routine x 2)     Status: None  (Preliminary result)  Collection Time: 01/17/22  4:19 PM   Specimen: BLOOD  Result Value Ref Range Status   Specimen Description   Final    BLOOD RIGHT ANTECUBITAL Performed at [facility] [address], [place], [place] [address]    Special Requests   Final    BOTTLES DRAWN AEROBIC ONLY Blood Culture adequate volume Performed at [facility] [address] [place], [place] [address]    Culture   Final    NO GROWTH 4 DAYS Performed at [facility], [place] [address], [place], [place] [address]    Report Status PENDING  Incomplete  Culture, blood (Routine X 2) w Reflex to ID Panel     Status: None (Preliminary result)   Collection Time: 01/20/22  9:19 AM   Specimen: BLOOD RIGHT HAND  Result Value Ref Range Status   Specimen Description   Final    BLOOD RIGHT HAND Performed at [facility] [address], [place], [place] [address]    Special Requests   Final    BOTTLES DRAWN AEROBIC ONLY Blood Culture adequate volume Performed at [facility] [address] [place], [place] [address]    Culture   Final    NO GROWTH < 24 HOURS Performed at [facility] [address], [place], [place] [address]    Report Status PENDING  Incomplete  Culture, blood (Routine X 2) w Reflex to ID Panel     Status: None (Preliminary result)   Collection Time: 01/20/22  9:19 AM   Specimen: Left Antecubital; Blood  Result Value Ref Range Status   Specimen Description   Final    LEFT ANTECUBITAL Performed at [facility] [address] [place], [place] [address]    Special Requests   Final    BOTTLES DRAWN AEROBIC ONLY Blood Culture adequate volume Performed at [facility] [address] [place], [place] [address]    Culture   Final    NO GROWTH < 24 HOURS Performed at [facility] [address] [place], [place] [address]    Report Status PENDING   Incomplete     Labs: BNP (last 3 results) No results for input(s): BNP in the last 8760 hours. Basic Metabolic Panel: Recent Labs  Lab 01/17/22 1115 01/18/22 0438 01/20/22 0430 01/20/22 0919  NA 133* 132*  --  137  K 3.1* 4.2  --  3.8  CL 101 102  --  101  CO2 26 26  --  30  GLUCOSE 107* 126*  --  88  BUN 13 8  --  7  CREATININE 0.72 0.55  --  0.50  CALCIUM 8.7* 8.6*  --  9.1  MG  --   --  2.1  --    Liver Function Tests: Recent Labs  Lab 01/17/22 1115 01/18/22 0438  AST 14* 13*  ALT 10 8  ALKPHOS 52 44  BILITOT 0.5 0.4  PROT 7.3 6.0*  ALBUMIN 3.7 3.0*   Recent Labs  Lab 01/17/22 1115  LIPASE 32   No results for input(s): AMMONIA in the last 168 hours. CBC: Recent Labs  Lab 01/17/22 1115 01/18/22 0438 01/20/22 0919  WBC 14.6* 12.9* 6.4  NEUTROABS 11.8*  --  3.9  HGB 12.1 10.7* 10.8*  HCT 36.3 32.2* 32.6*  MCV 91.9 95.3 93.7  PLT 204 144* 185   Cardiac Enzymes: No results for input(s): CKTOTAL, CKMB, CKMBINDEX, TROPONINI  in the last 168 hours. BNP: Invalid input(s): POCBNP CBG: Recent Labs  Lab 01/19/22 2152  GLUCAP 139*   D-Dimer No results for input(s): DDIMER in the last 72 hours. Hgb A1c No results for input(s): HGBA1C in the last 72 hours. Lipid Profile No results for input(s): CHOL, HDL, LDLCALC, TRIG, CHOLHDL, LDLDIRECT in the last 72 hours. Thyroid function studies Recent Labs    01/20/22 0919  TSH 1.701   Anemia work up Recent Labs    01/19/22 0439  VITAMINB12 190  FOLATE 16.1  FERRITIN 157  TIBC 214*  IRON 12*  RETICCTPCT 1.1   Urinalysis    Component Value Date/Time   COLORURINE YELLOW 01/17/2022 1115   APPEARANCEUR HAZY (A) 01/17/2022 1115   LABSPEC 1.038 (H) 01/17/2022 1115   PHURINE 6.0 01/17/2022 1115   GLUCOSEU NEGATIVE 01/17/2022 1115   HGBUR NEGATIVE 01/17/2022 1115   BILIRUBINUR NEGATIVE 01/17/2022 1115   BILIRUBINUR negative 03/18/2020 0914   KETONESUR NEGATIVE 01/17/2022 1115   PROTEINUR NEGATIVE  01/17/2022 1115   UROBILINOGEN 0.2 03/18/2020 0914   UROBILINOGEN 0.2 09/14/2008 1422   NITRITE POSITIVE (A) 01/17/2022 1115   LEUKOCYTESUR LARGE (A) 01/17/2022 1115   Sepsis Labs Invalid input(s): PROCALCITONIN,  WBC,  LACTICIDVEN Microbiology Recent Results (from the past 240 hour(s))  Blood culture (routine x 2)     Status: None (Preliminary result)   Collection Time: 01/17/22 11:50 AM   Specimen: Left Antecubital; Blood  Result Value Ref Range Status   Specimen Description   Final    LEFT ANTECUBITAL Performed at [facility], [place] [address], [place], [place] [address]    Special Requests   Final    BOTTLES DRAWN AEROBIC AND ANAEROBIC Blood Culture adequate volume Performed at [facility] [address], [place], [place] [address]    Culture   Final    NO GROWTH 4 DAYS Performed at [facility], [place] [address], [place], [place] [address]    Report Status PENDING  Incomplete  Urine Culture     Status: Abnormal   Collection Time: 01/17/22  1:42 PM   Specimen: Urine, Clean Catch  Result Value Ref Range Status   Specimen Description   Final    URINE, CLEAN CATCH Performed at [facility], [place] [address] [place], [place] [address]    Special Requests   Final    NONE Performed at [facility], [place] [address] [place], [place] [address]    Culture (A)  Final    <10,000 COLONIES/mL INSIGNIFICANT GROWTH Performed at [facility] [address], [place], [place] [address]    Report Status 01/18/2022 FINAL  Final  Blood culture (routine x 2)     Status: None (Preliminary result)   Collection Time: 01/17/22  4:19 PM   Specimen: BLOOD  Result Value Ref Range Status   Specimen Description   Final    BLOOD RIGHT ANTECUBITAL Performed at [facility] [address] [place], [place] [address]    Special Requests   Final    BOTTLES DRAWN AEROBIC ONLY Blood Culture  adequate volume Performed at [facility] [address] [place], [place] [address]    Culture   Final    NO GROWTH 4 DAYS Performed at [facility], [place] [address], [place], [place] [address]    Report Status PENDING  Incomplete  Culture, blood (Routine X 2) w Reflex to ID Panel  Status: None (Preliminary result)   Collection Time: 01/20/22  9:19 AM   Specimen: BLOOD RIGHT HAND  Result Value Ref Range Status   Specimen Description   Final    BLOOD RIGHT HAND Performed at [facility] [address] [place], [place] [address]    Special Requests   Final    BOTTLES DRAWN AEROBIC ONLY Blood Culture adequate volume Performed at [facility] [address], [place], [place] [address]    Culture   Final    NO GROWTH < 24 HOURS Performed at [facility] [address], [place], [place] [address]    Report Status PENDING  Incomplete  Culture, blood (Routine X 2) w Reflex to ID Panel     Status: None (Preliminary result)   Collection Time: 01/20/22  9:19 AM   Specimen: Left Antecubital; Blood  Result Value Ref Range Status   Specimen Description   Final    LEFT ANTECUBITAL Performed at [facility] [address] [place], [place] [id number]    Special Requests   Final    BOTTLES DRAWN AEROBIC ONLY Blood Culture adequate volume Performed at [facility] [address], [place], [place] [address]    Culture   Final    NO GROWTH < 24 HOURS Performed at [facility] [address] [place], [place] [address]    Report Status PENDING  Incomplete     SIGNED:   Charlynne Cousins, MD  [facility] 01/21/2022, 10:02 AM Pager   If 7PM-7AM, please contact night-coverage [email or web address] Password [email or web address]

## 2022-01-22 LAB — CULTURE, BLOOD (ROUTINE X 2)
Culture: NO GROWTH
Culture: NO GROWTH
Special Requests: ADEQUATE
Special Requests: ADEQUATE

## 2022-01-23 ENCOUNTER — Telehealth: Payer: Self-pay | Admitting: *Deleted

## 2022-01-23 ENCOUNTER — Telehealth: Payer: Self-pay

## 2022-01-23 LAB — ZINC: Zinc: 42 ug/dL — ABNORMAL LOW (ref 44–115)

## 2022-01-23 NOTE — Telephone Encounter (Signed)
From the discharge call: ? ?She said she is definitely feeling better than she did when she went to the hospital but she is still weak and feeling sick. She stated that she feels she is responding to treatments. She explained that she was instructed to check her temperature twice daily and keep a log of the results. She has all of her medications.  ? ?She wants to discuss the need for a referral to GI to address her gastroparesis.  ? ?She is concerned that [facility] has changed her recent in person appointments with Dr Margarita Rana to virtual without her permission. She wants to continue to see Dr Margarita Rana but wants to make sure that the visits are in person.  ? ?A referral has been placed to the nutritionist. She explained that there has been a discussion about the possible need for a feeding tube for supplemental nutrition. ? ?Scheduled to see Dr Margarita Rana- 01/30/2022 @ 0950. Per patient's request, this appointment was scheduled with patient's mother, Shuntay Everetts.  ? ?

## 2022-01-23 NOTE — Telephone Encounter (Signed)
Transition Care Management Unsuccessful Follow-up Telephone Call ? ?Date of discharge and from where:  01/21/2022 - [facility] ? ?Attempts:  1st Attempt ? ?Reason for unsuccessful TCM follow-up call:  Unable to reach patient ?

## 2022-01-23 NOTE — Telephone Encounter (Signed)
Transition Care Management Follow-up Telephone Call ?Date of discharge and from where: 01/21/2022, [facility]  ?How have you been since you were released from the hospital? She said she is definitely feeling better than she did when she went to the hospital but she is still weak and feeling sick. She stated that she feels she is responding to treatments. She explained that she was instructed to check her temperature twice daily and keep a log of the results.  ?Any questions or concerns? Yes - She wants to discuss the need for a referral to GI to address her gastroparesis.  ?She is concerned that [facility] has changed her recent in person appointments with Dr Margarita Rana to virtual without her permission. She wants to continue to see Dr Margarita Rana but wants to make sure that the visits are in person.  ? ?Items Reviewed: ?Did the pt receive and understand the discharge instructions provided? Yes  ?Medications obtained and verified? Yes  - she said she has all of her medications.  ?Other? No  ?Any new allergies since your discharge? No  ?Dietary orders reviewed?  A referral has been placed to the nutritionist. She explained that there has been a discussion about the possible need for a feeding tube for supplemental nutrition. ?Do you have support at home? Yes , her family. ? ?Home Care and Equipment/Supplies: ?Were home health services ordered? no ?If so, what is the name of the agency? N/a  ?Has the agency set up a time to come to the patient's home? not applicable ?Were any new equipment or medical supplies ordered?  No ?What is the name of the medical supply agency? N/a ?Were you able to get the supplies/equipment? not applicable ?Do you have any questions related to the use of the equipment or supplies? No ? ?Functional Questionnaire: (I = Independent and D = Dependent) ?ADLs: independent ? ? ?Follow up appointments reviewed: ? ?PCP Hospital f/u appt confirmed? Yes  Scheduled to see Dr Margarita Rana- 01/30/2022 @ 0950. Per  patient's request, this appointment was scheduled with patient's mother, Caprina Wussow.  ?[facility] f/u appt confirmed? Yes  Scheduled to see outpatient rehab - 02/01/2022, [facility]- 02/24/2022.  ?Are transportation arrangements needed? No  - she prefers that her family drive her to appointments ?If their condition worsens, is the pt aware to call PCP or go to the Emergency Dept.? Yes ?Was the patient provided with contact information for the PCP's office or ED? Yes ?Was to pt encouraged to call back with questions or concerns? Yes ? ?

## 2022-01-24 LAB — VITAMIN B6: Vitamin B6: 2.6 ug/L — ABNORMAL LOW (ref 3.4–65.2)

## 2022-01-24 NOTE — Telephone Encounter (Signed)
Transition Care Management Unsuccessful Follow-up Telephone Call ? ?Date of discharge and from where:  01/21/2022 - [facility] ? ?Attempts:  2nd Attempt ? ?Reason for unsuccessful TCM follow-up call:  Unable to reach patient ?

## 2022-01-25 LAB — VITAMIN K1, SERUM: VITAMIN K1: 0.26 ng/mL (ref 0.10–2.20)

## 2022-01-25 LAB — CULTURE, BLOOD (ROUTINE X 2)
Culture: NO GROWTH
Culture: NO GROWTH
Special Requests: ADEQUATE
Special Requests: ADEQUATE

## 2022-01-25 NOTE — Telephone Encounter (Signed)
Transition Care Management Unsuccessful Follow-up Telephone Call ? ?Date of discharge and from where:  01/21/2022 - [facility] ? ?Attempts:  3rd Attempt ? ?Reason for unsuccessful TCM follow-up call:  Unable to reach patient ?

## 2022-01-29 LAB — VITAMIN A: Vitamin A (Retinoic Acid): 9.6 ug/dL — ABNORMAL LOW (ref 18.9–57.3)

## 2022-01-30 ENCOUNTER — Other Ambulatory Visit: Payer: Self-pay

## 2022-01-30 ENCOUNTER — Ambulatory Visit: Payer: Medicaid Other | Attending: Family Medicine | Admitting: Family Medicine

## 2022-01-30 ENCOUNTER — Encounter: Payer: Self-pay | Admitting: Family Medicine

## 2022-01-30 VITALS — BP 118/81 | HR 68 | Ht 62.5 in | Wt 101.4 lb

## 2022-01-30 DIAGNOSIS — M791 Myalgia, unspecified site: Secondary | ICD-10-CM

## 2022-01-30 DIAGNOSIS — E440 Moderate protein-calorie malnutrition: Secondary | ICD-10-CM

## 2022-01-30 DIAGNOSIS — K582 Mixed irritable bowel syndrome: Secondary | ICD-10-CM | POA: Diagnosis not present

## 2022-01-30 NOTE — Progress Notes (Signed)
? ?Subjective:  ?Patient ID: Rachel [name], female    DOB: [birth year]  Age: 36 y.o. MRN: [id number] ? ?CC: Hospitalization Follow-up ? ? ?HPI ?Rachel [name] is a 36 y.o. year old female with a history of bipolar disorder, anxiety, depression, GERD, IBS with constipation, status post laparoscopic TAHBSO due to abnormal uterine bleed currently on Premarin by GYN) ?She was hospitalized for pyelonephritis from 01/17/2022 through 01/21/2022.  Labs revealed hyponatremia, hypokalemia, iron deficiency anemia.  Treated with IV fluids, IV antibiotics, IV iron.  Electrolytes were repleted during her stay.  She was subsequently discharged on Omnicef. ? ?Interval History: ?Today she presents with a couple concerns. ?She does not feel like her food has moved in 3 days and she states 'they are thinking of placing a feeding tube''. Complains of feeling bloated, nausea, her bowels do not move for a while then come out as diarrhea. She complains [facility] did not do anything for her and so she returned to her local GI with [facility].  Currently doing ensure since she is unable to tolerate most foods and she continues to lose weight.  This has helped frustrated.  She got her mom on the phone to speak to me during the encounter as well ?She as an upcoming appt with Nutrition. ? ?She is on a waiting list at [facility] to have genetic testing for Drue Dun prior to undergoing physical therapy as she has seen rheumatology who had thought she might have that. ?She has generalized pain in her muscles and her joints.  She also informs me at a point she was thought to have had Marfan's but she is unable to find a place to test her for this and going out of state she was informed they would not take out-of-state insurance. ? ?She has sporadic fevers and flu like symptoms for which she has an upcoming appointment.  With infectious disease. ?Past Medical History:  ?Diagnosis Date  ? ADHD (attention deficit  hyperactivity disorder)   ? Anemia   ? Anxiety   ? Asthma   ? Bipolar disorder ([place])   ? Complication of anesthesia   ? slow to wake up   ? Delayed gastric emptying 02/23/2020  ? Depression   ? DUB (dysfunctional uterine bleeding) 02/23/2020  ? Endometriosis   ? GERD (gastroesophageal reflux disease)   ? Heart murmur   ? Hiatal hernia 04/06/2020  ? Myalgia 04/06/2020  ? Parasitic infection 02/23/2020  ? Pneumonia   ? Poor appetite 02/23/2020  ? RUQ pain 02/23/2020  ? Vitamin D deficiency   ? Weight loss 02/23/2020  ? ? ?Past Surgical History:  ?Procedure Laterality Date  ? barium study    ? COLONOSCOPY    ? CYSTOSCOPY N/A 07/12/2021  ? Procedure: CYSTOSCOPY;  Surgeon: [name], MD;  Location: [facility];  Service: Gynecology;  Laterality: N/A;  ? ESOPHAGOGASTRODUODENOSCOPY  2011  ? and a ph study as well.   ? LAPAROSCOPY N/A 01/26/2021  ? Procedure: LAPAROSCOPY DIAGNOSTIC;  Surgeon: [name], MD;  Location: [facility];  Service: Gynecology;  Laterality: N/A;  ? NASAL SEPTUM SURGERY    ? TONGUE FLAP    ? TOTAL LAPAROSCOPIC HYSTERECTOMY WITH SALPINGECTOMY Bilateral 07/12/2021  ? Procedure: TOTAL LAPAROSCOPIC HYSTERECTOMY WITH SALPINGECTOMY, OOPHORECTOMY;  Surgeon: [name], MD;  Location: [facility];  Service: Gynecology;  Laterality: Bilateral;  ? UPPER GASTROINTESTINAL ENDOSCOPY    ? WISDOM TOOTH EXTRACTION    ? ? ?Family History  ?Problem  Relation Age of Onset  ? Colon polyps Mother   ? Depression Mother   ? Anxiety disorder Mother   ? Hypertension Mother   ? Hyperlipidemia Mother   ? Asthma Mother   ? Endometriosis Mother   ? Fibroids Mother   ? Fibromyalgia Mother   ? Migraines Mother   ? Irritable bowel syndrome Mother   ? Depression Father   ? Hypertension Father   ? Hyperlipidemia Father   ? Bipolar disorder Father   ? Bipolar disorder Sister   ? ADD / ADHD Sister   ? Asthma Sister   ? Endometriosis Sister   ? Seizures Sister   ? Bipolar disorder Sister   ? Anxiety disorder Sister    ? Heart disease Maternal Grandmother   ? Endometriosis Maternal Grandmother   ? Fibroids Maternal Grandmother   ? Hyperlipidemia Maternal Grandmother   ? Hypertension Maternal Grandmother   ? Diabetes Maternal Grandmother   ? Fibromyalgia Maternal Grandmother   ? Heart disease Maternal Grandfather   ? Diabetes Maternal Grandfather   ? Heart disease Paternal Grandmother   ? Heart disease Paternal Grandfather   ? Colon cancer Maternal Great-grandmother   ? Pancreatic cancer Neg Hx   ? Esophageal cancer Neg Hx   ? Stomach cancer Neg Hx   ? ? ?Social History  ? ?Socioeconomic History  ? Marital status: Divorced  ?  Spouse name: Not on file  ? Number of children: Not on file  ? Years of education: Not on file  ? Highest education level: Not on file  ?Occupational History  ? Not on file  ?Tobacco Use  ? Smoking status: Never  ? Smokeless tobacco: Never  ?Vaping Use  ? Vaping Use: Never used  ?Substance and Sexual Activity  ? Alcohol use: Yes  ?  Alcohol/week: 1.0 standard drink  ?  Types: 1 Glasses of wine per week  ?  Comment: 1 glass of wine per week  ? Drug use: No  ? Sexual activity: Not Currently  ?Other Topics Concern  ? Not on file  ?Social History Narrative  ? Not on file  ? ?Social Determinants of Health  ? ?Financial Resource Strain: Not on file  ?Food Insecurity: No Food Insecurity  ? Worried About Charity fundraiser in the Last Year: Never true  ? Ran Out of Food in the Last Year: Never true  ?Transportation Needs: No Transportation Needs  ? Lack of Transportation (Medical): No  ? Lack of Transportation (Non-Medical): No  ?Physical Activity: Not on file  ?Stress: Not on file  ?Social Connections: Not on file  ? ? ?Allergies  ?Allergen Reactions  ? Amphetamine-Dextroamphetamine Other (See Comments)  ?  Triggered bipolar, caused depression, and suicidal thoughts  ? Lupron [Leuprolide] Anaphylaxis  ? Freida Busman [Elagolix] Anaphylaxis  ? Penicillins Anaphylaxis, Swelling and Other (See Comments)  ?  Potential for  anaphylaxis confirmed. Tolerates Cefphalosporins ?No "-cillins"!!!! ?Did it involve swelling of the face/tongue/throat, SOB, or low BP? Yes ?Did it involve sudden or severe rash/hives, skin peeling, or any reaction on the inside of your mouth or nose? Unknown ?Did you need to seek medical attention at a hospital or doctor's office? Unknown ?When did it last happen? teenager      ?If all above answers are "NO", may proceed with cephalosporin use.  ? Chlorhexidine Hives  ?  DuraPrep   ? Doxycycline Itching, Nausea And Vomiting and Other (See Comments)  ?  Caused high fever, also  ?  Olanzapine Other (See Comments)  ?  Caused mania  ? Other Other (See Comments)  ?  Stimulants, mood stabilizers, and antidepressants = Triggered bipolar, caused depression, and suicidal thoughts  ? Prozac [Fluoxetine Hcl] Other (See Comments)  ?  Triggered bipolar, caused depression, and suicidal thoughts  ? Tape Other (See Comments)  ?  Paper tape is tolerated  ? Triple Antibiotic Pain Relief [Neomy-Bacit-Polymyx-Pramoxine] Hives  ? Reglan [Metoclopramide] Anxiety and Other (See Comments)  ?  Triggered bipolar, caused depression, suicidal thoughts, muscle twitching, stiffness, and high anxiety   ? ? ?Outpatient Medications Prior to Visit  ?Medication Sig Dispense Refill  ? albuterol (PROVENTIL) (2.5 MG/3ML) 0.083% nebulizer solution Take 3 mLs (2.5 mg total) by nebulization every 6 (six) hours as needed for wheezing or shortness of breath. Max 4 doses per day 75 mL 0  ? albuterol (VENTOLIN HFA) 108 (90 Base) MCG/ACT inhaler Inhale 1-2 puffs into the lungs every 6 (six) hours as needed for wheezing or shortness of breath. 8 g 1  ? Ascorbic Acid (VITAMIN C) 1000 MG tablet Take 1,000 mg by mouth 2 (two) times daily.    ? Calcium-Magnesium-Vitamin D 600-40-500 MG-MG-UNIT TB24 Take 3 tablets by mouth in the morning and at bedtime.    ? cefdinir (OMNICEF) 300 MG capsule Take 1 capsule (300 mg total) by mouth 2 (two) times daily for 10 days. 20  capsule 0  ? Cholecalciferol (VITAMIN D3) 50 MCG (2000 UT) TABS Take 2,000 Units by mouth daily.    ? ELDERBERRY PO Take 1 tablet by mouth daily.    ? EPINEPHrine 0.3 mg/0.3 mL IJ SOAJ injection Inject 0.3 mg in

## 2022-01-30 NOTE — Patient Instructions (Signed)
Protein-Energy Malnutrition Protein-energy malnutrition is when a person does not eat enough protein, fat, and calories. When this happens over time, it can lead to severe loss of muscle tissue (muscle wasting). This condition also affects the body's defense system (immune system) and can lead to other health problems. What are the causes? This condition may be caused by: Not eating enough protein, fat, or calories. Having certain chronic medical conditions. Eating too little. What increases the risk? The following factors may make you more likely to develop this condition: Living in poverty. Long-term hospitalization. Alcohol or drug dependency. Addiction often leads to a lifestyle in which proper diet is ignored. Dependency can also hurt the metabolism and the body's ability to absorb nutrients. Eating disorders, such as anorexia nervosa or bulimia. Chewing or swallowing problems. People with these disorders may not eat enough. Having certain conditions, such as: Inflammatory bowel disease. Inflammation of the intestines makes it difficult for the body to absorb nutrients. Cancer or AIDS. These diseases can cause a loss of appetite. Chronic heart failure. This interferes with how the body uses nutrients. Cystic fibrosis. This disease can make it difficult for the body to absorb nutrients. Eating a diet that extremely restricts protein, fat, or calorie intake. What are the signs or symptoms? Symptoms of this condition include: Tiredness (fatigue). Weakness. Dizziness. Fainting. Weight loss. Loss of muscle tone and muscle mass. Poor immune response. Lack of menstruation. Poor memory. Hair loss. Skin changes. How is this diagnosed? This condition may be diagnosed based on: Your medical and dietary history. A physical exam. This may include a measurement of your body mass index. Blood tests. How is this treated? This condition may be managed with: Nutrition therapy. This may  include working with a dietitian. Treatment for underlying conditions. People with severe protein-energy malnutrition may need to be treated in a hospital. This may involve receiving nutrition and fluids through an IV. Follow these instructions at home:  Eat a balanced diet. In each meal, include at least one food that is high in protein. Foods that are high in protein include: Meat. Poultry. Fish. Eggs. Cheese. Milk. Beans. Nuts. Eat nutrient-rich foods that are easy to swallow and digest, such as: Fruit and yogurt smoothies. Oatmeal with nut butter. Nutrition supplement drinks. Try to eat six small meals each day instead of three large meals. Take vitamin and protein supplements as told by your health care provider or dietitian. Follow your health care provider's recommendations about exercise and activity. Keep all follow-up visits. This is important. Contact a health care provider if: You have increased weakness or fatigue. You faint. You are a woman and you stop having your period (menstruating). You have rapid hair loss. You have unexpected weight loss. You have diarrhea. You have nausea and vomiting. Get help right away if: You have difficulty breathing. You have chest pain. These symptoms may represent a serious problem that is an emergency. Do not wait to see if the symptoms will go away. Get medical help right away. Call your local emergency services (911 in the [place] Do not drive yourself to the hospital. Summary Protein-energy malnutrition is when a person does not eat enough protein, fat, and calories. Protein-energy malnutrition can lead to severe loss of muscle tissue (muscle wasting). This condition also affects the body's defense system (immune system) and can lead to other health problems. Talk with your health care provider about treatment for this condition. Effective treatment depends on the underlying cause of the malnutrition. This information is not    intended to replace advice given to you by your health care provider. Make sure you discuss any questions you have with your health care provider. Document Revised: 10/30/2020 Document Reviewed: 10/30/2020 Elsevier Patient Education  2022 [employer]  

## 2022-01-31 ENCOUNTER — Encounter: Payer: Self-pay | Admitting: Family Medicine

## 2022-01-31 ENCOUNTER — Other Ambulatory Visit: Payer: Self-pay | Admitting: Gastroenterology

## 2022-02-01 ENCOUNTER — Other Ambulatory Visit: Payer: Self-pay

## 2022-02-01 ENCOUNTER — Encounter: Payer: Self-pay | Admitting: Physical Therapy

## 2022-02-01 ENCOUNTER — Ambulatory Visit: Payer: Medicaid Other | Attending: Gastroenterology | Admitting: Physical Therapy

## 2022-02-01 DIAGNOSIS — R279 Unspecified lack of coordination: Secondary | ICD-10-CM | POA: Insufficient documentation

## 2022-02-01 DIAGNOSIS — M62838 Other muscle spasm: Secondary | ICD-10-CM | POA: Diagnosis not present

## 2022-02-01 NOTE — Therapy (Signed)
?OUTPATIENT PHYSICAL THERAPY FEMALE PELVIC EVALUATION ? ? ?Patient Name: Rachel [name] ?MRN: [id number] ?DOB:[birth year], 36 y.o., female ?Today's Date: 02/01/2022 ? ? PT End of Session - 02/01/22 1948   ? ? Visit Number 1   ? Date for PT Re-Evaluation 04/26/22   ? Authorization Type medicaid healthy blue   ? PT Start Time 1620   ? PT Stop Time 1700   ? PT Time Calculation (min) 40 min   ? Activity Tolerance Patient tolerated treatment well   ? Behavior During Therapy [facility] for tasks assessed/performed   ? ?  ?  ? ?  ? ? ?Past Medical History:  ?Diagnosis Date  ? ADHD (attention deficit hyperactivity disorder)   ? Anemia   ? Anxiety   ? Asthma   ? Bipolar disorder ([place])   ? Complication of anesthesia   ? slow to wake up   ? Delayed gastric emptying 02/23/2020  ? Depression   ? DUB (dysfunctional uterine bleeding) 02/23/2020  ? Endometriosis   ? GERD (gastroesophageal reflux disease)   ? Heart murmur   ? Hiatal hernia 04/06/2020  ? Myalgia 04/06/2020  ? Parasitic infection 02/23/2020  ? Pneumonia   ? Poor appetite 02/23/2020  ? RUQ pain 02/23/2020  ? Vitamin D deficiency   ? Weight loss 02/23/2020  ? ?Past Surgical History:  ?Procedure Laterality Date  ? barium study    ? COLONOSCOPY    ? CYSTOSCOPY N/A 07/12/2021  ? Procedure: CYSTOSCOPY;  Surgeon: [name], MD;  Location: [place];  Service: Gynecology;  Laterality: N/A;  ? ESOPHAGOGASTRODUODENOSCOPY  2011  ? and a ph study as well.   ? LAPAROSCOPY N/A 01/26/2021  ? Procedure: LAPAROSCOPY DIAGNOSTIC;  Surgeon: [name], MD;  Location: [place];  Service: Gynecology;  Laterality: N/A;  ? NASAL SEPTUM SURGERY    ? TONGUE FLAP    ? TOTAL LAPAROSCOPIC HYSTERECTOMY WITH SALPINGECTOMY Bilateral 07/12/2021  ? Procedure: TOTAL LAPAROSCOPIC HYSTERECTOMY WITH SALPINGECTOMY, OOPHORECTOMY;  Surgeon: [name], MD;  Location: [place];  Service: Gynecology;  Laterality: Bilateral;  ? UPPER GASTROINTESTINAL ENDOSCOPY    ? WISDOM TOOTH EXTRACTION     ? ?Patient Active Problem List  ? Diagnosis Date Noted  ? Iron deficiency anemia 01/20/2022  ? Acute hyponatremia 01/18/2022  ? Pyelonephritis 01/18/2022  ? Protein-calorie malnutrition, severe 01/18/2022  ? Pyelonephritis of left kidney 01/17/2022  ? Sepsis ([place]) 01/17/2022  ? Chronic abdominal pain 01/17/2022  ? Dyspnea 01/17/2022  ? Underweight 01/17/2022  ? Irritable bowel syndrome (IBS) 01/17/2022  ? Hypokalemia 01/17/2022  ? Vasomotor symptoms due to menopause 08/17/2021  ? S/P laparoscopic hysterectomy 07/12/2021  ? Preoperative exam for gynecologic surgery 06/20/2021  ? Adverse drug reaction 06/01/2021  ? Draining postoperative wound 02/28/2021  ? Encounter for postoperative care 02/16/2021  ? Endometriosis determined by laparoscopy   ? Pelvic pain in female 10/11/2020  ? Presence of subdermal contraceptive implant 10/11/2020  ? Dysmenorrhea 10/11/2020  ? Dyspareunia in female 10/11/2020  ? NSVT (nonsustained ventricular tachycardia) 09/03/2020  ? 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 Palpitations 07/26/2020  ? Family history of connective tissue disease 07/26/2020  ? Myalgia 04/06/2020  ? Hiatal hernia 04/06/2020  ? Parasitic infection 02/23/2020  ? Weight loss 02/23/2020  ? Poor appetite 02/23/2020  ? RUQ pain 02/23/2020  ? Delayed gastric emptying 02/23/2020  ? DUB (dysfunctional uterine bleeding) 02/23/2020  ? Cervical radiculopathy 05/31/2018  ? Hyperalgesia 05/31/2018  ? Acid reflux 12/26/2017  ? ADHD (attention  deficit hyperactivity disorder) 05/07/2012  ? Bipolar 1 disorder ([place]) 05/07/2012  ? ? ?PCP: Charlott Rakes, MD ? ?REFERRING PROVIDER: Mauri Pole, MD ? ?REFERRING DIAG:  ?K59.04 (ICD-10-CM) - Chronic idiopathic constipation  ?K58.1 (ICD-10-CM) - Irritable bowel syndrome with constipation  ?M62.89 (ICD-10-CM) - Pelvic floor dysfunction  ? ? ?THERAPY DIAG:  ?Other muscle spasm ? ?Unspecified lack of coordination ? ?ONSET DATE: ongoing ?SUBJECTIVE:                                                                                                                                                                                           ? ?SUBJECTIVE STATEMENT: ?I have gastroparesis and have a hard time digesting.  Have mostly diarrhea or small pebbles.  Pt mom is present throughout ?Fluid intake: a lot of fluids ? ?Patient confirms identification and approves PT to assess pelvic floor and treatment Yes ? ? ?PAIN:  ?Are you having pain? Yes ?NPRS scale: 7/10 ?Pain location: under the ribcage ? ?Pain type: burning and nawing ?Pain description: constant  ? ?Aggravating factors: maybe food, sitting, weather changes ?Relieving factors: heat, lying down ? ?PRECAUTIONS: None ? ?WEIGHT BEARING RESTRICTIONS No ? ?FALLS:  ?Has patient fallen in last 6 months? No, Number of falls: 0 ? ?LIVING ENVIRONMENT: ?Lives with: lives with their family ?Lives in: House/apartment ? ? ?OCCUPATION: disability ? ?PLOF: Independent ? ?PATIENT GOALS feel better, less pain, have more regular bowel movement ? ?PERTINENT HISTORY:  ?hysterectomy, myalgia, bipolar ?Sexual abuse: No ? ?BOWEL MOVEMENT ?Pain with bowel movement: No ?Type of bowel movement:Type (Bristol Stool Scale) full spectrum, full pieces of food and fat in stool unable to digest, Frequency hard stool 1 every 2 weeks;, and Strain Yes when it is formed it is tiny and hard ?Fully empty rectum: Yes: when liquid ?Leakage: No ?Pads: No ?Fiber supplement: No mirilax, senna, linsess ? ?URINATION ?Pain with urination: No ?Fully empty bladder: No ?Stream: Strong ?Urgency: No ?Frequency: normal ?Leakage:  no ?Pads: No ? ?INTERCOURSE ? ? ? ?OBJECTIVE:  ? ? ?COGNITION: ? Overall cognitive status: Within functional limits for tasks assessed   ?  ?SENSATION: ? Light touch: Appears intact ?  ? ?MUSCLE LENGTH: hyperflexilble throughout ?Hamstrings: WFL ?Thomas test: WFL ? ? ? ?GAIT: ? ?Comments: WFL ? ?POSTURE:  ?WFL ? ?LUMBARAROM/PROM ? ?WFL ? ?LE ROM: ?  Hypermobilty bil  hips ? ?LE MMT: ?Hip adduction 4/5 bil; ext 4/5 bil ? ?PELVIC MMT: ?  ?MMT  ?02/01/2022  ?Vaginal   ?Internal Anal Sphincter   ?External Anal Sphincter   ?Puborectalis   ?Diastasis Recti   ?1/5 MMT rectal assessment with 2  sec hold; very slow to contract and relax only does 1 rep in 10 seconds ? ?      PALPATION: ? ? ?              External Perineal Exam high tone, flexed coccyx ?              ?              Internal Pelvic Floor see above and very TTP with light touch, unable to palpate beyond puborectalis ? ?TONE: ?High with weakness ? ? ? ?TODAY'S TREATMENT  ?EVAL tactile cues and educated on pelvic floor contraction and dilator infomation ? ? ?PATIENT EDUCATION:  ?Education details: dilators and initial HEP ?Person educated: Patient and Caregiver ?Education method: Explanation, Demonstration, Tactile cues, Verbal cues, and Handouts ?Education comprehension: verbalized understanding and returned demonstration ? ? ?HOME EXERCISE PROGRAM: ? ?Access Code: [facility] ?URL: [email or web address] ?Date: 02/01/2022 ?Prepared by: Jari Favre ? ?Exercises ?- Ball squeeze with Kegel  - 3 x daily - 7 x weekly - 1 sets - 10 reps - 3 sec hold ? ? ?ASSESSMENT: ? ?CLINICAL IMPRESSION: ?Patient is a 36 y.o. female who was seen today for physical therapy evaluation and treatment for constipation with outlet syndrome. Pt has 1/5 MMT of anal sphincters with high tone.  She is very slow to engage the muscles needing to think about it a lot.  Pt is very TTP with mild pressure.  Coccyx is flexed.  Pt has decreased adductor strength and hypermobility of bil hips.  Pt will benefit from skilled PT to address all above mentioned impairments ? ? ? ?OBJECTIVE IMPAIRMENTS decreased coordination, decreased endurance, decreased strength, and impaired tone.  ? ?ACTIVITY LIMITATIONS community activity and toileting  .  ? ?PERSONAL FACTORS Past/current experiences, Time since onset of injury/illness/exacerbation, and 3+  comorbidities: slow transit bowels, myalgia, IBS  are also affecting patient's functional outcome.  ? ? ?REHAB POTENTIAL: Good ? ?CLINICAL DECISION MAKING: Unstable/unpredictable ? ?EVALUATION COMPLEXITY: Moderate

## 2022-02-02 ENCOUNTER — Ambulatory Visit (INDEPENDENT_AMBULATORY_CARE_PROVIDER_SITE_OTHER): Payer: Medicaid Other | Admitting: Clinical

## 2022-02-02 DIAGNOSIS — F909 Attention-deficit hyperactivity disorder, unspecified type: Secondary | ICD-10-CM

## 2022-02-02 DIAGNOSIS — F4310 Post-traumatic stress disorder, unspecified: Secondary | ICD-10-CM

## 2022-02-07 ENCOUNTER — Encounter: Payer: Medicaid Other | Attending: Family Medicine | Admitting: Registered"

## 2022-02-07 ENCOUNTER — Other Ambulatory Visit: Payer: Self-pay

## 2022-02-07 DIAGNOSIS — E43 Unspecified severe protein-calorie malnutrition: Secondary | ICD-10-CM | POA: Insufficient documentation

## 2022-02-07 NOTE — Progress Notes (Signed)
?Medical Nutrition Therapy  ?Appointment Start time:  9:40  Appointment End time:  10:40 ? ?Primary concerns today: none stated  ?Referral diagnosis: protein-calorie malnutrition ?Preferred learning style: no preference indicated ?Learning readiness: contemplating ? ? ?NUTRITION ASSESSMENT  ? ?Pt arrives with mom. Pt states she started losing a lot of weight within the last 5 years and health started to decline. States she was a lot heavier when taking hormonal medications. States she's had a lot of challenges with food since childhood. Mom states during infancy, pt was breasted and had some reflux. Reports when pt started to be introduced to foods, sitting more upright, there was less reflux. Reports pt mainly would eat cooked vegetables, cooked fruit, and some breads. Mom states pt would eat regular food but when she did, she would have reflux, vomiting, constipation, and bloating. Reports she would often order veggie plates. Also reports tolerating peanut butter and jelly sandwiches and yogurt tubes for lunch.  ? ?Pt states she has had 2 abdominal surgeries and does not want another surgery. Mom informs pt that j-tube placement is not a surgery but more so a procedure. Pt expresses concerns about being able to go to the beach, swim, and participate in water activities. Pt expresses concerns about j-tube not being temporary but becoming permanent and another burden added to her life.  ? ?Mom states pt lives with her but spends a lot of time at boyfriend's house. States it does not appear that pt is following nutrition recommendations provided to her upon discharge. Pt states she has been drinking 1-2 shakes/day; mom disagrees. Pt states it has been overwhelming to remember to do all the things and keep up with it on her own. Mom states she has offered to support and pt wants to do things on her own. States she feels like a burden.  ? ?Pt reports having a therapist and seeing her virtually every 2 weeks. States  this is a lot to process.  ? ?We contacted inpatient RD for j-tube questions and concerns to help pt process. Inpatient RD answered all questions and concerns. Pt and mom will contact PCP for surgeon referral to move forward with j-tube. Pt will follow-up with me in 2-3 weeks.  ? ?Pt questions what her ideal weight should be. States she was told by a physical therapist that 105# was her ideal weight. Reports she felt her best when she was 115#-able to be physically active and stomach felt well. States her butt was there, boobs were there, stomach was flat, and thighs had a gap but not too much of a gap. States she didn't have a lot of fat on her body then and looked like an athlete that she was when she was a child. States she doesn't want to gain fat but wants muscle. Mom states pt used to 150 lbs, pt denies it and stated her highest weight was 145 lbs. Per records, it appears her weight decreased significantly between 2019-20.  ? ? ?Clinical ?Medical Hx: IBS, acid reflux, delayed gastric emptying, poor appetite, severe protein-calorie malnutrition, iron deficiency anemia, acute hyponatremia, hypokalemia ?Medications: See list ?Labs: decreased B6 (2.6), decreased zinc (42),  decreased iron (12), decreased Vitamin A (9.6),  ?Notable Signs/Symptoms: fatigue, brain fog, challenges with focus/concentration ? ?Lifestyle & Dietary Hx ? ?Estimated daily fluid intake: not provided ?Supplements: See list ?Sleep: not provided ?Stress / self-care: not provided ?Current average weekly physical activity: not provided ? ?24-Hr Dietary Recall: not provided ?First Meal:  ?Snack:  ?Second  Meal:  ?Snack:  ?Third Meal:  ?Snack:  ?Beverages: 1-2  Ensures  ? ?Estimated Energy Needs ?Calories: 3329-5188 ?Carbohydrate: 225-256 g ?Protein: 90-105 g ?Fat: 60-68 g ? ? ?NUTRITION DIAGNOSIS  ?NI-5.7.1 Inadequate protein intake As related to decreased ability to consume sufficient protein and energy.  As evidenced by estimated energy intake  from diet less than recommended levels. ? ? ?NUTRITION INTERVENTION  ?Nutrition education (E-1) on the following topics:  ?Benefits of j-tube placement ?Benefits of having mental health provider to discuss thoughts moving forward ? ?Handouts Provided Include  ?none ? ?Learning Style & Readiness for Change ?Teaching method utilized: Visual & Auditory  ?Demonstrated degree of understanding via: Teach Back  ?Barriers to learning/adherence to lifestyle change: contemplative stage of change, preoccupation with thoughts about weight and body image ? ?Goals Established by Pt ?Resume dietary regimen recommended upon hospital discharge: 3 Ensures Complete/day ? ? ?MONITORING & EVALUATION ?Dietary intake, weekly physical activity. ? ?Next Steps  ?Patient is to follow-up in 2-3 weeks, as soon as provider's schedule allows. ?

## 2022-02-07 NOTE — BH Specialist Note (Addendum)
Integrated Behavioral Health via Telemedicine Visit ? ?02/20/2022 ?Rachel [name] ?[id number] ? ?Number of [place] Clinician visits: Additional Visit ? ?Session Start time: 1047 ?  ?Session End time: 8250 ? ?Total time in minutes: 57 ? ? ?Referring Provider: Lynnda Shields, MD ?Patient/Family location: Home ?[facility] Provider location: Center for [employer] at [facility] ? ?All persons participating in visit: [name] and [place]  ? ?Types of Service: Individual psychotherapy and Telephone visit ? ?I connected with Rachel [name] and/or [name]  via  Telephone or Video Enabled Telemedicine Application  (Video is Tree surgeon) and verified that I am speaking with the correct person using two identifiers. Discussed confidentiality: Yes  ? ?I discussed the limitations of telemedicine and the availability of in person appointments.  Discussed there is a possibility of technology failure and discussed alternative modes of communication if that failure occurs. ? ?I discussed that engaging in this telemedicine visit, they consent to the provision of behavioral healthcare and the services will be billed under their insurance. ? ?Patient and/or legal guardian expressed understanding and consented to Telemedicine visit: Yes  ? ?Presenting Concerns: ?Patient and/or family reports the following symptoms/concerns: Increased anxiousness over preparing for likely need to begin using feeding tube in upcoming months, and the effect that will have on daily life.  ?Duration of problem: Ongoing; Severity of problem: moderate ? ?Patient and/or Family's Strengths/Protective Factors: ?Social connections, Social and Patent attorney, Concrete supports in place (healthy food, safe environments, etc.), Sense of purpose, and Physical Health (exercise, healthy diet, medication compliance, etc.) ? ?Goals Addressed: ?Patient will: ?  Reduce symptoms of: anxiety and stress  ? Demonstrate ability to: Increase healthy adjustment to current life circumstances ? ?Progress towards Goals: ?Ongoing ? ?Interventions: ?Interventions utilized:  Solution-Focused Strategies ?Standardized Assessments completed: Not Needed ? ?Patient and/or Family Response: Patient agrees with treatment [name]. ? ? ?Assessment: ?Patient currently experiencing PTSD and ADHD (both previously diagnosed).  ? ?Patient may benefit from continued therapeutic interventions. ? ?[name]: ?Follow up with behavioral health clinician on : Two weeks ?Behavioral recommendations:  ?-Continue using self-coping strategies daily, to help manage emotions during this time ?-Continue using healthy boundaries with ex and his family/friends by maintaining no contact at all ?-Consider weekend outing prior to feeding tube insertion; enjoy activities that will be discontinued with tube (ex. Hot tub/swimming) ?Referral(s): [place] (In Clinic) ? ?I discussed the assessment and treatment [name] with the patient and/or parent/guardian. They were provided an opportunity to ask questions and all were answered. They agreed with the [name] and demonstrated an understanding of the instructions. ?  ?They were advised to call back or seek an in-person evaluation if the symptoms worsen or if the condition fails to improve as anticipated. ? ?[name], LCSW ? ? ?  01/30/2022  ?  9:56 AM 01/11/2022  ?  3:39 PM 10/19/2021  ? 11:19 AM 06/20/2021  ?  8:45 AM 06/17/2021  ?  1:46 PM  ?Depression screen PHQ 2/9  ?Decreased Interest 0 0 1 0 0  ?Down, Depressed, Hopeless 0 0 1 0 0  ?PHQ - 2 Score 0 0 2 0 0  ?Altered sleeping '3 2 1 3   '$ ?Tired, decreased energy 2 1 0 2   ?Change in appetite 2 2 0 3   ?Feeling bad or failure about yourself  0 0 1 0   ?Trouble concentrating '1 2 1 1   '$ ?Moving slowly or  fidgety/restless 0 0 0 0   ?Suicidal thoughts 0 0 0 0   ?PHQ-9 Score '8 7 5 9   '$ ? ? ?  01/30/2022  ?  9:56 AM  10/19/2021  ? 11:25 AM 06/20/2021  ?  8:45 AM 06/17/2021  ?  1:47 PM  ?GAD 7 : Generalized Anxiety Score  ?Nervous, Anxious, on Edge 0 '1 1 1  '$ ?Control/stop worrying 0 0 0 1  ?Worry too much - different things 0 1 0 0  ?Trouble relaxing '1 1 1 '$ 0  ?Restless 0 0 0 0  ?Easily annoyed or irritable 0 1 0 0  ?Afraid - awful might happen 0 1 0 0  ?Total GAD 7 Score '1 5 2 2  '$ ? ? ? ?

## 2022-02-20 ENCOUNTER — Ambulatory Visit (INDEPENDENT_AMBULATORY_CARE_PROVIDER_SITE_OTHER): Payer: Medicaid Other | Admitting: Clinical

## 2022-02-20 DIAGNOSIS — F4310 Post-traumatic stress disorder, unspecified: Secondary | ICD-10-CM | POA: Diagnosis not present

## 2022-02-20 DIAGNOSIS — F909 Attention-deficit hyperactivity disorder, unspecified type: Secondary | ICD-10-CM

## 2022-02-23 NOTE — BH Specialist Note (Addendum)
Integrated Behavioral Health via Telemedicine Visit ? ?03/08/2022 ?Jacoya Bauman ?[id number] ? ?Number of [place] Clinician visits: Additional Visit ? ?Session Start time: 1046 ?  ?Session End time: 8675 ? ?Total time in minutes: 63 ? ? ?Referring Provider: Lynnda Shields, MD ?Patient/Family location: Home ?[facility] Provider location: Center for [employer] at [facility] ? ?All persons participating in visit: Patient Eadie Repetto and [place]  ? ?Types of Service: Individual psychotherapy and Telephone visit ? ?I connected with [name] and/or [place]  via  Telephone or Video Enabled Telemedicine Application  (Video is Tree surgeon) and verified that I am speaking with the correct person using two identifiers. Discussed confidentiality: Yes  ? ?I discussed the limitations of telemedicine and the availability of in person appointments.  Discussed there is a possibility of technology failure and discussed alternative modes of communication if that failure occurs. ? ?I discussed that engaging in this telemedicine visit, they consent to the provision of behavioral healthcare and the services will be billed under their insurance. ? ?Patient and/or legal guardian expressed understanding and consented to Telemedicine visit: Yes  ? ?Presenting Concerns: ?Patient and/or family reports the following symptoms/concerns: "brain fog, blurry vision" attributed to malnutrition; becoming more aware of being triggered into stress response, along with other life stressors. Pt is hopeful that medical treatment, including physical therapy , hydrotherapy and nutritional changes will begin to improve health and overall wellbeing.  ?Duration of problem: ongoing; Severity of problem: moderate ? ?Patient and/or Family's Strengths/Protective Factors: ?Social connections, Social and Patent attorney, Concrete supports in place (healthy  food, safe environments, etc.), Sense of purpose, and Physical Health (exercise, healthy diet, medication compliance, etc.) ? ?Goals Addressed: ?Patient will: ? Reduce symptoms of: anxiety and stress  ? Increase knowledge and/or ability of: healthy habits  ? Demonstrate ability to: Increase healthy adjustment to current life circumstances ? ?Progress towards Goals: ?Ongoing ? ?Interventions: ?Interventions utilized:  Solution-Focused Strategies ?Standardized Assessments completed: Not Needed ? ?Patient and/or Family Response: Patient agrees with treatment plan. ? ? ?Assessment: ?Patient currently experiencing PTSD and ADHD (both previously diagnosed).  ? ?Patient may benefit from continued therapy. ? ?Plan: ?Follow up with behavioral health clinician on : Two weeks ?Behavioral recommendations:  ?-Begin hydrotherapy treatments; continue with weekly physical therapy appointments and daily Ensure 3x/day as recommended by medical providers ?-Continue using self-coping strategies daily as prioritiy ?-Continue planning for [place] maintaining open communication ?-Continue with healthy boundaries with ex and his family ?Referral(s): [place] (In Clinic) ? ?I discussed the assessment and treatment plan with the patient and/or parent/guardian. They were provided an opportunity to ask questions and all were answered. They agreed with the plan and demonstrated an understanding of the instructions. ?  ?They were advised to call back or seek an in-person evaluation if the symptoms worsen or if the condition fails to improve as anticipated. ? ?[name], LCSW ?

## 2022-02-24 ENCOUNTER — Encounter: Payer: Self-pay | Admitting: Physical Therapy

## 2022-02-24 ENCOUNTER — Ambulatory Visit: Payer: Medicaid Other | Attending: Gastroenterology | Admitting: Physical Therapy

## 2022-02-24 ENCOUNTER — Ambulatory Visit (INDEPENDENT_AMBULATORY_CARE_PROVIDER_SITE_OTHER): Payer: Medicaid Other | Admitting: Internal Medicine

## 2022-02-24 ENCOUNTER — Other Ambulatory Visit: Payer: Self-pay

## 2022-02-24 ENCOUNTER — Encounter: Payer: Self-pay | Admitting: Internal Medicine

## 2022-02-24 VITALS — BP 112/81 | HR 89 | Temp 98.6°F | Wt 103.0 lb

## 2022-02-24 DIAGNOSIS — R279 Unspecified lack of coordination: Secondary | ICD-10-CM | POA: Diagnosis not present

## 2022-02-24 DIAGNOSIS — R6889 Other general symptoms and signs: Secondary | ICD-10-CM | POA: Diagnosis not present

## 2022-02-24 DIAGNOSIS — M62838 Other muscle spasm: Secondary | ICD-10-CM | POA: Diagnosis not present

## 2022-02-24 DIAGNOSIS — M6281 Muscle weakness (generalized): Secondary | ICD-10-CM | POA: Insufficient documentation

## 2022-02-24 NOTE — Therapy (Signed)
?OUTPATIENT PHYSICAL THERAPY TREATMENT NOTE ? ? ?Patient Name: Rachel [name] ?MRN: [id number] ?DOB:March 04, 1986, 36 y.o., female ?Today's Date: 02/24/2022 ? ?PCP: Charlott Rakes, MD ?REFERRING PROVIDER: Charlott Rakes, MD ? ?END OF SESSION:  ? PT End of Session - 02/24/22 0807   ? ? Visit Number 2   ? Date for PT Re-Evaluation 04/26/22   ? Authorization Type medicaid healthy blue   ? PT Start Time 0802   ? PT Stop Time [phone]   ? PT Time Calculation (min) 41 min   ? Activity Tolerance Patient tolerated treatment well   ? Behavior During Therapy [facility] for tasks assessed/performed   ? ?  ?  ? ?  ? ? ?Past Medical History:  ?Diagnosis Date  ? ADHD (attention deficit hyperactivity disorder)   ? Anemia   ? Anxiety   ? Asthma   ? Bipolar disorder ([place])   ? Complication of anesthesia   ? slow to wake up   ? Delayed gastric emptying 02/23/2020  ? Depression   ? DUB (dysfunctional uterine bleeding) 02/23/2020  ? Endometriosis   ? GERD (gastroesophageal reflux disease)   ? Heart murmur   ? Hiatal hernia 04/06/2020  ? Myalgia 04/06/2020  ? Parasitic infection 02/23/2020  ? Pneumonia   ? Poor appetite 02/23/2020  ? RUQ pain 02/23/2020  ? Vitamin D deficiency   ? Weight loss 02/23/2020  ? ?Past Surgical History:  ?Procedure Laterality Date  ? barium study    ? COLONOSCOPY    ? CYSTOSCOPY N/A 07/12/2021  ? Procedure: CYSTOSCOPY;  Surgeon: [name], MD;  Location: [place];  Service: Gynecology;  Laterality: N/A;  ? ESOPHAGOGASTRODUODENOSCOPY  2011  ? and a ph study as well.   ? LAPAROSCOPY N/A 01/26/2021  ? Procedure: LAPAROSCOPY DIAGNOSTIC;  Surgeon: [name], MD;  Location: [place];  Service: Gynecology;  Laterality: N/A;  ? NASAL SEPTUM SURGERY    ? TONGUE FLAP    ? TOTAL LAPAROSCOPIC HYSTERECTOMY WITH SALPINGECTOMY Bilateral 07/12/2021  ? Procedure: TOTAL LAPAROSCOPIC HYSTERECTOMY WITH SALPINGECTOMY, OOPHORECTOMY;  Surgeon: [name], MD;  Location: [place];  Service: Gynecology;   Laterality: Bilateral;  ? UPPER GASTROINTESTINAL ENDOSCOPY    ? WISDOM TOOTH EXTRACTION    ? ?Patient Active Problem List  ? Diagnosis Date Noted  ? Iron deficiency anemia 01/20/2022  ? Acute hyponatremia 01/18/2022  ? Pyelonephritis 01/18/2022  ? Protein-calorie malnutrition, severe 01/18/2022  ? Pyelonephritis of left kidney 01/17/2022  ? Sepsis ([place]) 01/17/2022  ? Chronic abdominal pain 01/17/2022  ? Dyspnea 01/17/2022  ? Underweight 01/17/2022  ? Irritable bowel syndrome (IBS) 01/17/2022  ? Hypokalemia 01/17/2022  ? Vasomotor symptoms due to menopause 08/17/2021  ? S/P laparoscopic hysterectomy 07/12/2021  ? Preoperative exam for gynecologic surgery 06/20/2021  ? Adverse drug reaction 06/01/2021  ? Draining postoperative wound 02/28/2021  ? Encounter for postoperative care 02/16/2021  ? Endometriosis determined by laparoscopy   ? Pelvic pain in female 10/11/2020  ? Presence of subdermal contraceptive implant 10/11/2020  ? Dysmenorrhea 10/11/2020  ? Dyspareunia in female 10/11/2020  ? NSVT (nonsustained ventricular tachycardia) ([place]) 09/03/2020  ? 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 Palpitations 07/26/2020  ? Family history of connective tissue disease 07/26/2020  ? Myalgia 04/06/2020  ? Hiatal hernia 04/06/2020  ? Parasitic infection 02/23/2020  ? Weight loss 02/23/2020  ? Poor appetite 02/23/2020  ? RUQ pain 02/23/2020  ? Delayed gastric emptying 02/23/2020  ? DUB (dysfunctional uterine bleeding) 02/23/2020  ? Cervical radiculopathy  05/31/2018  ? Hyperalgesia 05/31/2018  ? Acid reflux 12/26/2017  ? ADHD (attention deficit hyperactivity disorder) 05/07/2012  ? Bipolar 1 disorder ([place]) 05/07/2012  ? ? ?REFERRING DIAG:  ?K59.04 (ICD-10-CM) - Chronic idiopathic constipation  ?K58.1 (ICD-10-CM) - Irritable bowel syndrome with constipation  ?M62.89 (ICD-10-CM) - Pelvic floor dysfunction  ?  ? ? ?THERAPY DIAG:  ?Other muscle spasm ? ?Unspecified lack of coordination ? ?PERTINENT HISTORY: hysterectomy, myalgia,  bipolar ? ?PRECAUTIONS: None ? ?SUBJECTIVE: My BM's have been sluggish, I am going every 5 days either liquid or small pellets ? ?PAIN:  ?Are you having pain? Yes - typical abdominal pain/discomfort - no number given ? ? ?LE ROM: ?                        Hypermobilty bil hips ?  ?LE MMT: ?Hip adduction 4/5 bil; ext 4/5 bil ?  ?PELVIC MMT: ?  ?MMT   ?02/01/2022  ?Vaginal    ?Internal Anal Sphincter    ?External Anal Sphincter    ?Puborectalis    ?Diastasis Recti    ?1/5 MMT rectal assessment with 2 sec hold; very slow to contract and relax only does 1 rep in 10 seconds ?  ?      PALPATION: ?  ?  ?              External Perineal Exam high tone, flexed coccyx ?              ?              Internal Pelvic Floor see above and very TTP with light touch, unable to palpate beyond puborectalis ?  ?TONE: ?High with weakness ?  ?  ?  ?TODAY'S TREATMENT  ?Treatment: 02/24/22 ?Exercises  ?Yoga block with hip IR or ER - kegels ?Manual ?Abdominal release and mobility of stomach, liver, ascending, descending, sigmoid colon ? ?Nuero Re-ed ?Education and cues for coordination of breathing and pelvic floor muscle contracting and relaxing at appropriate times ? ?  ?PATIENT EDUCATION:  ?Education details: dilators and initial HEP ?Person educated: Patient and Caregiver ?Education method: Explanation, Demonstration, Tactile cues, Verbal cues, and Handouts ?Education comprehension: verbalized understanding and returned demonstration ?  ?  ?HOME EXERCISE PROGRAM: ?  ?Access Code: [facility] ?URL: [email or web address] ?Date: 02/01/2022 ?Prepared by: Jari Favre ?  ?Exercises ?- Ball squeeze with Kegel  - 3 x daily - 7 x weekly - 1 sets - 10 reps - 3 sec hold ?  ?  ?ASSESSMENT: ?  ?CLINICAL IMPRESSION: ?Patient is present to initial f/u visit today.  She had a lot of movement throughout abdomen was fascial release.  Liver region more stuck that other regions.  Pt was educated on using block and different hip positions.   Pt did well with exercises but needed a lot of cues and did better with hip IR.  Still demonstrating very weak contraction. ?  ?  ?  ?OBJECTIVE IMPAIRMENTS decreased coordination, decreased endurance, decreased strength, and impaired tone.  ?  ?ACTIVITY LIMITATIONS community activity and toileting  .  ?  ?PERSONAL FACTORS Past/current experiences, Time since onset of injury/illness/exacerbation, and 3+ comorbidities: slow transit bowels, myalgia, IBS  are also affecting patient's functional outcome.  ?  ?  ?REHAB POTENTIAL: Good ?  ?CLINICAL DECISION MAKING: Unstable/unpredictable ?  ?EVALUATION COMPLEXITY: Moderate ?  ?  ?GOALS: ?Goals reviewed with patient? Yes ?  ?SHORT TERM GOALS: Target date: 04/26/2022 ?  ?Ind with  initial HEP ?Baseline:  ?Goal status: ongoing ?  ?  ?LONG TERM GOALS: Target date: 04/26/2022 ?  ?Pt will be independent with advanced HEP to maintain improvements made throughout therapy ?  ?Baseline:  ?Goal status: INITIAL ?  ?2.  Pt will report 2 solid BMs per week due to improved muscle tone and coordination with BMs.  ?Baseline:  ?Goal status: INITIAL ?  ?3.  Pt will report her BMs are complete due to improved bowel habits and evacuation techniques.  ?Baseline:  ?Goal status: INITIAL ?  ?4.  Pt will be able to sustain pelvic floor contraction for at least 10 seconds for improved functional trunk support during activities such as lifting ?Baseline:  ?Goal status: INITIAL ?  ?  ?PLAN: ?PT FREQUENCY: 1x/week ?  ?PT DURATION: 12 weeks ?  ?PLANNED INTERVENTIONS: Therapeutic exercises, Therapeutic activity, Neuromuscular re-education, Balance training, Gait training, Patient/Family education, Joint mobilization, Dry Needling, Electrical stimulation, Cryotherapy, Moist heat, Taping, Biofeedback, and Manual therapy ?  ?PLAN FOR NEXT SESSION: add to Access Code: [email or web address]; f/u on dilators and abdominal fascial release, coccyx mobs qped and seated, basic core strengthening ?  ? ? ?Jule Ser,  PT ?02/24/2022, 8:08 AM ? ?   ?

## 2022-02-24 NOTE — Progress Notes (Signed)
? ?   ? ? ? ? ?Patient Active Problem List  ? Diagnosis Date Noted  ? Iron deficiency anemia 01/20/2022  ? Acute hyponatremia 01/18/2022  ? Pyelonephritis 01/18/2022  ? Protein-calorie malnutrition, severe 01/18/2022  ? Pyelonephritis of left kidney 01/17/2022  ? Sepsis ([place]) 01/17/2022  ? Chronic abdominal pain 01/17/2022  ? Dyspnea 01/17/2022  ? Underweight 01/17/2022  ? Irritable bowel syndrome (IBS) 01/17/2022  ? Hypokalemia 01/17/2022  ? Vasomotor symptoms due to menopause 08/17/2021  ? S/P laparoscopic hysterectomy 07/12/2021  ? Preoperative exam for gynecologic surgery 06/20/2021  ? Adverse drug reaction 06/01/2021  ? Draining postoperative wound 02/28/2021  ? Encounter for postoperative care 02/16/2021  ? Endometriosis determined by laparoscopy   ? Pelvic pain in female 10/11/2020  ? Presence of subdermal contraceptive implant 10/11/2020  ? Dysmenorrhea 10/11/2020  ? Dyspareunia in female 10/11/2020  ? NSVT (nonsustained ventricular tachycardia) ([place]) 09/03/2020  ? 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 Palpitations 07/26/2020  ? Family history of connective tissue disease 07/26/2020  ? Myalgia 04/06/2020  ? Hiatal hernia 04/06/2020  ? Parasitic infection 02/23/2020  ? Weight loss 02/23/2020  ? Poor appetite 02/23/2020  ? RUQ pain 02/23/2020  ? Delayed gastric emptying 02/23/2020  ? DUB (dysfunctional uterine bleeding) 02/23/2020  ? Cervical radiculopathy 05/31/2018  ? Hyperalgesia 05/31/2018  ? Acid reflux 12/26/2017  ? ADHD (attention deficit hyperactivity disorder) 05/07/2012  ? Bipolar 1 disorder ([place]) 05/07/2012  ? ? ?Patient's Medications  ?New Prescriptions  ? No medications on file  ?Previous Medications  ? ALBUTEROL (PROVENTIL) (2.5 MG/3ML) 0.083% NEBULIZER SOLUTION    Take 3 mLs (2.5 mg total) by nebulization every 6 (six) hours as needed for wheezing or shortness of breath. Max 4 doses per day  ? ALBUTEROL (VENTOLIN HFA) 108 (90 BASE) MCG/ACT INHALER    Inhale 1-2 puffs into the lungs every 6  (six) hours as needed for wheezing or shortness of breath.  ? ASCORBIC ACID (VITAMIN C) 1000 MG TABLET    Take 1,000 mg by mouth 2 (two) times daily.  ? CALCIUM-MAGNESIUM-VITAMIN D 600-40-500 MG-MG-UNIT TB24    Take 3 tablets by mouth in the morning and at bedtime.  ? CHOLECALCIFEROL (VITAMIN D3) 50 MCG (2000 UT) TABS    Take 2,000 Units by mouth daily.  ? ELDERBERRY PO    Take 1 tablet by mouth daily.  ? EPINEPHRINE 0.3 MG/0.3 ML IJ SOAJ INJECTION    Inject 0.3 mg into the muscle as needed for anaphylaxis.  ? ESTROGENS, CONJUGATED, (PREMARIN) 0.45 MG TABLET    Take 1 tablet (0.45 mg total) by mouth daily.  ? FEXOFENADINE (ALLEGRA) 180 MG TABLET    Take 180 mg by mouth daily.  ? IBUPROFEN (ADVIL) 600 MG TABLET    Take 1 tablet (600 mg total) by mouth every 6 (six) hours as needed for headache, mild pain, moderate pain or cramping.  ? LINZESS 290 MCG CAPS CAPSULE    TAKE ONE CAPSULE BY MOUTH DAILY BEFORE BREAKFAST  ? MELATONIN 10 MG TABS    Take 10 mg by mouth at bedtime.  ? MIRALAX 17 GM/SCOOP POWDER    Take 17 g by mouth See admin instructions. Mix 17 g of powder into water and drink once a day  ? MULTIPLE VITAMINS-MINERALS (ADULT ONE DAILY GUMMIES PO)    Take 2 tablets by mouth daily.  ? OMEGA-3 FATTY ACIDS (FISH OIL) 1200 MG CAPS    Take 1,200 mg by mouth daily.  ? PROMETHAZINE (PHENERGAN) 12.5  MG TABLET    Take 12.5 mg by mouth See admin instructions. Take 12.5 mg by mouth every 6-8 hours as needed for nausea  ? SENNOSIDES-DOCUSATE SODIUM (SENOKOT-S) 8.6-50 MG TABLET    Take 1-2 tablets by mouth daily.  ?Modified Medications  ? No medications on file  ?Discontinued Medications  ? No medications on file  ? ? ?Subjective: ?86 YF with bipolar disorder, anxiety, asthma, GERD, endometriosis/dysfunctional uterine bleed SP hysterectomy with b/l salpingo-oophorectomy presents for hospital follow-up. Found to have pyelonephritis on imaging. Urine Cx insignificant growth, Blood Cx neagitve. ID given per reproted HX of  fevers x 5 years. On review of EMR([facility] system) for the past 10 years  she had documented fever since 100.4> 3/7(on 2/3 she had temp of 100.2 ED visit for flu like symptoms).  ?Specialties  ?-She has been followed by ID in the pastfor a suspected parasite infection(stool ova parasite negative). Pt had seen a worm followed by eggs in her stool.  ?-Followed by GI, managed for nausea, constipation abdominal pain on linzess. Pt is concerned about weight loss(99lb on 01/1421, 103lb on 01/17/22). ?--Last seen by Rheumatology on 07/29/20(Dr. Deveshwar). Per documentation pt had concerns she may have Ehlers-danlos. Diagnosed with myofacial pain and hypermobility arthralgia. Reccommended physical therapy and isometric exercise. Noted family Hx of fibromyalgia(mother).  ?Today: She reports fevers every 3-4 days. Stated" malnurished", "flulike symptoms "  with " fatigue" "hot and sweatu" every 3 -4 days-reports these symptoms x 2-3 years.  No movement form gastropersis to undigested food in stool. "Can't digest fasts or almost anything" , concerned about weight(underweight).  ?Review of Systems: ?Review of Systems  ?All other systems reviewed and are negative. ? ?Past Medical History:  ?Diagnosis Date  ? ADHD (attention deficit hyperactivity disorder)   ? Anemia   ? Anxiety   ? Asthma   ? Bipolar disorder ([place])   ? Complication of anesthesia   ? slow to wake up   ? Delayed gastric emptying 02/23/2020  ? Depression   ? DUB (dysfunctional uterine bleeding) 02/23/2020  ? Endometriosis   ? GERD (gastroesophageal reflux disease)   ? Heart murmur   ? Hiatal hernia 04/06/2020  ? Myalgia 04/06/2020  ? Parasitic infection 02/23/2020  ? Pneumonia   ? Poor appetite 02/23/2020  ? RUQ pain 02/23/2020  ? Vitamin D deficiency   ? Weight loss 02/23/2020  ? ? ?Social History  ? ?Tobacco Use  ? Smoking status: Never  ? Smokeless tobacco: Never  ?Vaping Use  ? Vaping Use: Never used  ?Substance Use Topics  ? Alcohol use: Yes  ?  Alcohol/week: 1.0  standard drink  ?  Types: 1 Glasses of wine per week  ?  Comment: 1 glass of wine per week  ? Drug use: No  ? ? ?Family History  ?Problem Relation Age of Onset  ? Colon polyps Mother   ? Depression Mother   ? Anxiety disorder Mother   ? Hypertension Mother   ? Hyperlipidemia Mother   ? Asthma Mother   ? Endometriosis Mother   ? Fibroids Mother   ? Fibromyalgia Mother   ? Migraines Mother   ? Irritable bowel syndrome Mother   ? Depression Father   ? Hypertension Father   ? Hyperlipidemia Father   ? Bipolar disorder Father   ? Bipolar disorder Sister   ? ADD / ADHD Sister   ? Asthma Sister   ? Endometriosis Sister   ? Seizures Sister   ? Bipolar disorder  Sister   ? Anxiety disorder Sister   ? Heart disease Maternal Grandmother   ? Endometriosis Maternal Grandmother   ? Fibroids Maternal Grandmother   ? Hyperlipidemia Maternal Grandmother   ? Hypertension Maternal Grandmother   ? Diabetes Maternal Grandmother   ? Fibromyalgia Maternal Grandmother   ? Heart disease Maternal Grandfather   ? Diabetes Maternal Grandfather   ? Heart disease Paternal Grandmother   ? Heart disease Paternal Grandfather   ? Colon cancer Maternal Great-grandmother   ? Pancreatic cancer Neg Hx   ? Esophageal cancer Neg Hx   ? Stomach cancer Neg Hx   ? ? ?Allergies  ?Allergen Reactions  ? Amphetamine-Dextroamphetamine Other (See Comments)  ?  Triggered bipolar, caused depression, and suicidal thoughts  ? Lupron [Leuprolide] Anaphylaxis  ? Freida Busman [Elagolix] Anaphylaxis  ? Penicillins Anaphylaxis, Swelling and Other (See Comments)  ?  Potential for anaphylaxis confirmed. Tolerates Cefphalosporins ?No "-cillins"!!!! ?Did it involve swelling of the face/tongue/throat, SOB, or low BP? Yes ?Did it involve sudden or severe rash/hives, skin peeling, or any reaction on the inside of your mouth or nose? Unknown ?Did you need to seek medical attention at a hospital or doctor's office? Unknown ?When did it last happen? teenager      ?If all above answers  are "NO", may proceed with cephalosporin use.  ? Chlorhexidine Hives  ?  DuraPrep   ? Doxycycline Itching, Nausea And Vomiting and Other (See Comments)  ?  Caused high fever, also  ? Olanzapine Other (See Comme

## 2022-02-25 LAB — CBC WITH DIFFERENTIAL/PLATELET
Absolute Monocytes: 506 cells/uL (ref 200–950)
Basophils Absolute: 47 cells/uL (ref 0–200)
Basophils Relative: 0.6 %
Eosinophils Absolute: 284 cells/uL (ref 15–500)
Eosinophils Relative: 3.6 %
HCT: 39.3 % (ref 35.0–45.0)
Hemoglobin: 13 g/dL (ref 11.7–15.5)
Lymphs Abs: 2654 cells/uL (ref 850–3900)
MCH: 31.1 pg (ref 27.0–33.0)
MCHC: 33.1 g/dL (ref 32.0–36.0)
MCV: 94 fL (ref 80.0–100.0)
MPV: 11.3 fL (ref 7.5–12.5)
Monocytes Relative: 6.4 %
Neutro Abs: 4408 cells/uL (ref 1500–7800)
Neutrophils Relative %: 55.8 %
Platelets: 284 10*3/uL (ref 140–400)
RBC: 4.18 10*6/uL (ref 3.80–5.10)
RDW: 14.8 % (ref 11.0–15.0)
Total Lymphocyte: 33.6 %
WBC: 7.9 10*3/uL (ref 3.8–10.8)

## 2022-02-25 LAB — COMPLETE METABOLIC PANEL WITH GFR
AG Ratio: 1.8 (calc) (ref 1.0–2.5)
ALT: 10 U/L (ref 6–29)
AST: 15 U/L (ref 10–30)
Albumin: 4.5 g/dL (ref 3.6–5.1)
Alkaline phosphatase (APISO): 68 U/L (ref 31–125)
BUN: 17 mg/dL (ref 7–25)
CO2: 25 mmol/L (ref 20–32)
Calcium: 9.8 mg/dL (ref 8.6–10.2)
Chloride: 105 mmol/L (ref 98–110)
Creat: 0.73 mg/dL (ref 0.50–0.97)
Globulin: 2.5 g/dL (calc) (ref 1.9–3.7)
Glucose, Bld: 69 mg/dL (ref 65–99)
Potassium: 4.3 mmol/L (ref 3.5–5.3)
Sodium: 140 mmol/L (ref 135–146)
Total Bilirubin: 0.2 mg/dL (ref 0.2–1.2)
Total Protein: 7 g/dL (ref 6.1–8.1)
eGFR: 110 mL/min/{1.73_m2} (ref >=60)

## 2022-02-25 LAB — C-REACTIVE PROTEIN: CRP: 0.5 mg/L (ref <8.0)

## 2022-02-25 LAB — SEDIMENTATION RATE: Sed Rate: 6 mm/h (ref 0–20)

## 2022-02-27 ENCOUNTER — Other Ambulatory Visit: Payer: Self-pay | Admitting: Gastroenterology

## 2022-03-06 ENCOUNTER — Other Ambulatory Visit: Payer: Self-pay

## 2022-03-06 ENCOUNTER — Encounter: Payer: Self-pay | Admitting: Internal Medicine

## 2022-03-06 ENCOUNTER — Ambulatory Visit (INDEPENDENT_AMBULATORY_CARE_PROVIDER_SITE_OTHER): Payer: Medicaid Other | Admitting: Internal Medicine

## 2022-03-06 DIAGNOSIS — R6889 Other general symptoms and signs: Secondary | ICD-10-CM

## 2022-03-06 NOTE — Progress Notes (Addendum)
Subjective:    Patient ID: Rachel [name], female    DOB: 09/22/1986, 36 y.o.   MRN: [id number]  Chief Complaint  Patient presents with   Telehealth Consent     Virtual Visit via Telephone/Video Note   I connected withNAME@ on 03/06/2022 at 9:47 AM by telephone and verified that I am speaking with the correct person using two identifiers.   I discussed the limitations, risks, security and privacy concerns of performing an evaluation and management service by telephone and the availability of in person appointments. I also discussed with the patient that there may be a patient responsible charge related to this service. The patient expressed understanding and agreed to proceed.  Location:  Patient: Home Provider: [facility]   HPI: 36 YF with bipolar disorder, anxiety, asthma, GERD, endometriosis/dysfunctional uterine bleed SP hysterectomy with b/l salpingo-oophorectomy presents for hospital follow-up. Found to have pyelonephritis on imaging. Urine Cx insignificant growth, Blood Cx neagitve. ID given per reproted HX of fevers x 5 years. On review of EMR([facility] system) for the past 10 years  she had documented fever since 100.4> 3/7(on 2/3 she had temp of 100.2 ED visit for flu like symptoms).  Specialties  -She has been followed by ID in the pastfor a suspected parasite infection(stool ova parasite negative). Pt had seen a worm followed by eggs in her stool.  -Followed by GI, managed for nausea, constipation abdominal pain on linzess. Pt is concerned about weight loss(99lb on 01/1421, 103lb on 01/17/22). --Last seen by Rheumatology on 07/29/20(Dr. Deveshwar). Per documentation pt had concerns she may have Ehlers-danlos. Diagnosed with myofacial pain and hypermobility arthralgia. Reccommended physical therapy and isometric exercise. Noted family Hx of fibromyalgia(mother).  02/24/22: She reports fevers every 3-4 days. Stated" malnurished", "flulike symptoms "  with " fatigue" "hot and  sweatu" every 3 -4 days-reports these symptoms x 2-3 years.  No movement form gastropersis to undigested food in stool. "Can't digest fasts or almost anything" , concerned about weight(underweight).  Today 03/06/22 phone visit: lab results conveyed. Symptoms unchanged.  Review of Systems: Allergies  Allergen Reactions   Amphetamine-Dextroamphetamine Other (See Comments)    Triggered bipolar, caused depression, and suicidal thoughts   Lupron [Leuprolide] Anaphylaxis   Orilissa [Elagolix] Anaphylaxis   Penicillins Anaphylaxis, Swelling and Other (See Comments)    Potential for anaphylaxis confirmed. Tolerates Cefphalosporins No "-cillins"!!!! Did it involve swelling of the face/tongue/throat, SOB, or low BP? Yes Did it involve sudden or severe rash/hives, skin peeling, or any reaction on the inside of your mouth or nose? Unknown Did you need to seek medical attention at a hospital or doctor's office? Unknown When did it last happen? teenager      If all above answers are "NO", may proceed with cephalosporin use.   Chlorhexidine Hives    DuraPrep    Doxycycline Itching, Nausea And Vomiting and Other (See Comments)    Caused high fever, also   Olanzapine Other (See Comments)    Caused mania   Other Other (See Comments)    Stimulants, mood stabilizers, and antidepressants = Triggered bipolar, caused depression, and suicidal thoughts   Prozac [Fluoxetine Hcl] Other (See Comments)    Triggered bipolar, caused depression, and suicidal thoughts   Tape Other (See Comments)    Paper tape is tolerated   Triple Antibiotic Pain Relief [Neomy-Bacit-Polymyx-Pramoxine] Hives   Reglan [Metoclopramide] Anxiety and Other (See Comments)    Triggered bipolar, caused depression, suicidal thoughts, muscle twitching, stiffness, and high anxiety  Outpatient Medications Prior to Visit  Medication Sig Dispense Refill   albuterol (PROVENTIL) (2.5 MG/3ML) 0.083% nebulizer solution Take 3 mLs (2.5 mg  total) by nebulization every 6 (six) hours as needed for wheezing or shortness of breath. Max 4 doses per day 75 mL 0   albuterol (VENTOLIN HFA) 108 (90 Base) MCG/ACT inhaler Inhale 1-2 puffs into the lungs every 6 (six) hours as needed for wheezing or shortness of breath. 8 g 1   Ascorbic Acid (VITAMIN C) 1000 MG tablet Take 1,000 mg by mouth 2 (two) times daily.     Calcium-Magnesium-Vitamin D 600-40-500 MG-MG-UNIT TB24 Take 3 tablets by mouth in the morning and at bedtime.     Cholecalciferol (VITAMIN D3) 50 MCG (2000 UT) TABS Take 2,000 Units by mouth daily.     ELDERBERRY PO Take 1 tablet by mouth daily.     EPINEPHrine 0.3 mg/0.3 mL IJ SOAJ injection Inject 0.3 mg into the muscle as needed for anaphylaxis. 2 each 0   estrogens, conjugated, (PREMARIN) 0.45 MG tablet Take 1 tablet (0.45 mg total) by mouth daily. 30 tablet 6   fexofenadine (ALLEGRA) 180 MG tablet Take 180 mg by mouth daily.     ibuprofen (ADVIL) 600 MG tablet Take 1 tablet (600 mg total) by mouth every 6 (six) hours as needed for headache, mild pain, moderate pain or cramping. 30 tablet 2   LINZESS 290 MCG CAPS capsule TAKE ONE CAPSULE BY MOUTH DAILY BEFORE BREAKFAST 30 capsule 0   Melatonin 10 MG TABS Take 10 mg by mouth at bedtime.     MIRALAX 17 GM/SCOOP powder Take 17 g by mouth See admin instructions. Mix 17 g of powder into water and drink once a day     Multiple Vitamins-Minerals (ADULT ONE DAILY GUMMIES PO) Take 2 tablets by mouth daily.     Omega-3 Fatty Acids (FISH OIL) 1200 MG CAPS Take 1,200 mg by mouth daily.     promethazine (PHENERGAN) 12.5 MG tablet Take 12.5 mg by mouth See admin instructions. Take 12.5 mg by mouth every 6-8 hours as needed for nausea     sennosides-docusate sodium (SENOKOT-S) 8.6-50 MG tablet Take 1-2 tablets by mouth daily.     No facility-administered medications prior to visit.     Past Medical History:  Diagnosis Date   ADHD (attention deficit hyperactivity disorder)    Anemia     Anxiety    Asthma    Bipolar disorder ([place])    Complication of anesthesia    slow to wake up    Delayed gastric emptying 02/23/2020   Depression    DUB (dysfunctional uterine bleeding) 02/23/2020   Endometriosis    GERD (gastroesophageal reflux disease)    Heart murmur    Hiatal hernia 04/06/2020   Myalgia 04/06/2020   Parasitic infection 02/23/2020   Pneumonia    Poor appetite 02/23/2020   RUQ pain 02/23/2020   Vitamin D deficiency    Weight loss 02/23/2020     Past Surgical History:  Procedure Laterality Date   barium study     COLONOSCOPY     CYSTOSCOPY N/A 07/12/2021   Procedure: CYSTOSCOPY;  Surgeon: [name], MD;  Location: [place];  Service: Gynecology;  Laterality: N/A;   ESOPHAGOGASTRODUODENOSCOPY  2011   and a ph study as well.    LAPAROSCOPY N/A 01/26/2021   Procedure: LAPAROSCOPY DIAGNOSTIC;  Surgeon: [name], MD;  Location: [place];  Service: Gynecology;  Laterality: N/A;  NASAL SEPTUM SURGERY     TONGUE FLAP     TOTAL LAPAROSCOPIC HYSTERECTOMY WITH SALPINGECTOMY Bilateral 07/12/2021   Procedure: TOTAL LAPAROSCOPIC HYSTERECTOMY WITH SALPINGECTOMY, OOPHORECTOMY;  Surgeon: [name], MD;  Location: [place];  Service: Gynecology;  Laterality: Bilateral;   UPPER GASTROINTESTINAL ENDOSCOPY     WISDOM TOOTH EXTRACTION         Review of Systems  All other systems reviewed and are negative.    Objective:    Nursing note and vital signs reviewed.     Assessment & [name]:   Problem List Items Addressed This Visit   None    I am having Rachel [name] "Allie" maintain her fexofenadine, vitamin C, EPINEPHrine, Fish Oil, Calcium-Magnesium-Vitamin D, sennosides-docusate sodium, Vitamin D3, Melatonin, Multiple Vitamins-Minerals (ADULT ONE DAILY GUMMIES PO), promethazine, MiraLax, ibuprofen, estrogens (conjugated), albuterol, albuterol, ELDERBERRY PO, and Linzess.   No orders of the defined types were placed in this  encounter.  #Flu like symptoms Pt states she has constellation of symptoms  every 3-4 days. Stated she was " malnurished", "flulike symptoms "  with " fatigue" "hot and sweaty" every 3 -4 days-reports these symptoms x 2-3 years. She noted fevers in the 100s. Per record review found for the past 10 years  she had documented fevers during 3/7 hospitalization for pyelonephritis and  on 2/3 she had temp of 100.2 ED visit for flu like symptoms.  As above reported symptoms have persisted for years, unlikely it is infectious in etiology. Baseline labs including: cbc, cmp, crp and esr unrevealing(02/24/22).  No stool sample dropped off. [name]: -Conveyed stable labs. Pt plans to follow-up with Rheumatology. She follows with GI.  -Follow-up PRN I discussed the assessment and treatment [name] with the patient. The patient was provided an opportunity to ask questions and all were answered. The patient agreed with the [name] and demonstrated an understanding of the instructions.   The patient was advised to call back or seek an in-person evaluation if the symptoms worsen or if the condition fails to improve as anticipated.   I provided 15  minutes of non-face-to-face time during this encounter.  Follow-up: Return if symptoms worsen or fail to improve.

## 2022-03-07 ENCOUNTER — Ambulatory Visit (HOSPITAL_BASED_OUTPATIENT_CLINIC_OR_DEPARTMENT_OTHER): Payer: Medicaid Other | Attending: Family Medicine | Admitting: Physical Therapy

## 2022-03-07 ENCOUNTER — Encounter (HOSPITAL_BASED_OUTPATIENT_CLINIC_OR_DEPARTMENT_OTHER): Payer: Self-pay | Admitting: Physical Therapy

## 2022-03-07 DIAGNOSIS — M6281 Muscle weakness (generalized): Secondary | ICD-10-CM

## 2022-03-07 DIAGNOSIS — M2550 Pain in unspecified joint: Secondary | ICD-10-CM | POA: Diagnosis not present

## 2022-03-07 DIAGNOSIS — M790 Rheumatism, unspecified: Secondary | ICD-10-CM | POA: Diagnosis not present

## 2022-03-07 DIAGNOSIS — G8929 Other chronic pain: Secondary | ICD-10-CM | POA: Diagnosis not present

## 2022-03-07 DIAGNOSIS — M791 Myalgia, unspecified site: Secondary | ICD-10-CM

## 2022-03-07 DIAGNOSIS — R262 Difficulty in walking, not elsewhere classified: Secondary | ICD-10-CM

## 2022-03-07 NOTE — Therapy (Addendum)
?OUTPATIENT PHYSICAL THERAPY THORACOLUMBAR EVALUATION ? ? ?Patient Name: Rachel [name] ?MRN: [id number] ?DOB:10-17-1986, 36 y.o., female ?Today's Date: 03/07/2022 ? ? PT End of Session - 03/07/22 1558   ? ? Visit Number 3   ? Date for PT Re-Evaluation 04/26/22   ? Authorization Type medicaid healthy blue   ? PT Start Time 0900   ? PT Stop Time 0930   ? PT Time Calculation (min) 30 min   ? Activity Tolerance Patient tolerated treatment well   ? Behavior During Therapy [facility] for tasks assessed/performed   ? ?  ?  ? ?  ? ? ?Past Medical History:  ?Diagnosis Date  ? ADHD (attention deficit hyperactivity disorder)   ? Anemia   ? Anxiety   ? Asthma   ? Bipolar disorder ([place])   ? Complication of anesthesia   ? slow to wake up   ? Delayed gastric emptying 02/23/2020  ? Depression   ? DUB (dysfunctional uterine bleeding) 02/23/2020  ? Endometriosis   ? GERD (gastroesophageal reflux disease)   ? Heart murmur   ? Hiatal hernia 04/06/2020  ? Myalgia 04/06/2020  ? Parasitic infection 02/23/2020  ? Pneumonia   ? Poor appetite 02/23/2020  ? RUQ pain 02/23/2020  ? Vitamin D deficiency   ? Weight loss 02/23/2020  ? ?Past Surgical History:  ?Procedure Laterality Date  ? barium study    ? COLONOSCOPY    ? CYSTOSCOPY N/A 07/12/2021  ? Procedure: CYSTOSCOPY;  Surgeon: [name], MD;  Location: [place];  Service: Gynecology;  Laterality: N/A;  ? ESOPHAGOGASTRODUODENOSCOPY  2011  ? and a ph study as well.   ? LAPAROSCOPY N/A 01/26/2021  ? Procedure: LAPAROSCOPY DIAGNOSTIC;  Surgeon: [name], MD;  Location: [place];  Service: Gynecology;  Laterality: N/A;  ? NASAL SEPTUM SURGERY    ? TONGUE FLAP    ? TOTAL LAPAROSCOPIC HYSTERECTOMY WITH SALPINGECTOMY Bilateral 07/12/2021  ? Procedure: TOTAL LAPAROSCOPIC HYSTERECTOMY WITH SALPINGECTOMY, OOPHORECTOMY;  Surgeon: [name], MD;  Location: [place];  Service: Gynecology;  Laterality: Bilateral;  ? UPPER GASTROINTESTINAL ENDOSCOPY    ? WISDOM TOOTH  EXTRACTION    ? ?Patient Active Problem List  ? Diagnosis Date Noted  ? Iron deficiency anemia 01/20/2022  ? Acute hyponatremia 01/18/2022  ? Pyelonephritis 01/18/2022  ? Protein-calorie malnutrition, severe 01/18/2022  ? Pyelonephritis of left kidney 01/17/2022  ? Sepsis ([place]) 01/17/2022  ? Chronic abdominal pain 01/17/2022  ? Dyspnea 01/17/2022  ? Underweight 01/17/2022  ? Irritable bowel syndrome (IBS) 01/17/2022  ? Hypokalemia 01/17/2022  ? Vasomotor symptoms due to menopause 08/17/2021  ? S/P laparoscopic hysterectomy 07/12/2021  ? Preoperative exam for gynecologic surgery 06/20/2021  ? Adverse drug reaction 06/01/2021  ? Draining postoperative wound 02/28/2021  ? Encounter for postoperative care 02/16/2021  ? Endometriosis determined by laparoscopy   ? Pelvic pain in female 10/11/2020  ? Presence of subdermal contraceptive implant 10/11/2020  ? Dysmenorrhea 10/11/2020  ? Dyspareunia in female 10/11/2020  ? NSVT (nonsustained ventricular tachycardia) ([place]) 09/03/2020  ? 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 Palpitations 07/26/2020  ? Family history of connective tissue disease 07/26/2020  ? Myalgia 04/06/2020  ? Hiatal hernia 04/06/2020  ? Parasitic infection 02/23/2020  ? Weight loss 02/23/2020  ? Poor appetite 02/23/2020  ? RUQ pain 02/23/2020  ? Delayed gastric emptying 02/23/2020  ? DUB (dysfunctional uterine bleeding) 02/23/2020  ? Cervical radiculopathy 05/31/2018  ? Hyperalgesia 05/31/2018  ? Acid reflux 12/26/2017  ? ADHD (attention  deficit hyperactivity disorder) 05/07/2012  ? Bipolar 1 disorder ([place]) 05/07/2012  ? ? ?PCP: Charlott Rakes, MD ? ?REFERRING PROVIDER: Charlott Rakes, MD ? ?REFERRING DIAG: M79.10 (ICD-10-CM) - Myalgia ? ?THERAPY DIAG:  ?Myalgia ? ?Muscle weakness (generalized) ? ?Difficulty in walking, not elsewhere classified ? ?ONSET DATE: >67yr ? ?SUBJECTIVE:                                                                                                                                                                                           ? ?SUBJECTIVE STATEMENT: ?I have been having issues for at least 10-15 years.  Have had a few injuries to my cervical spine involving left shoulder and arm.  Pain in hips, shoulders, back and neck pretty constant. Unable to work.  Weight past few years 95-97lbs.  See PT for pelvic floor. ?PERTINENT HISTORY:  ?Irritable bowel syndrome with both constipation and diarrhea ?         Moderate protein-calorie malnutrition ?bipolar disorder, anxiety, depression, GERD, IBS with constipation, status post laparoscopic TAHBSO due to abnormal uterine bleed    currently on Premarin by GYN) ?She was hospitalized for pyelonephritis from 01/17/2022 through 01/21/2022. ?PAIN:  ?Are you having pain? Yes: NPRS scale: 7/10 ?Pain location: cerv spine, left shoulder and arm, LB, hips ?Pain description: ache ?Aggravating factors: overdoing although tolerance levels vary ?Relieving factors: rest ? ? ?PRECAUTIONS: Other: being [facility]/hypermobile joints ? ?WEIGHT BEARING RESTRICTIONS No ? ?FALLS:  ?Has patient fallen in last 6 months? No ? ?LIVING ENVIRONMENT: ?Lives with: lives with their family ?Lives in: House/apartment ?Stairs: Yes: Internal: 1 flight steps; yes ? ? ?OCCUPATION: unable to work ? ?PLOF: Independent with basic ADLs ? ?PATIENT GOALS gain strength, decrease pain ? ? ?OBJECTIVE:  ? ?COGNITION: ? Overall cognitive status:  WFL although pt reports bouts of brain fog    ?  ?SENSATION: ?Numbness and tingling LUE into cervical spine intermittent ?Occasional numbness and tingling in LE ? 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 ?POSTURE:  ?Very slight figure, slightly rounded shoulders ? ? ? ?ROM throughout wfl ?Strength ?UE 3+/5 ?LE 3/5 ? ? ?FUNCTIONAL TESTS:  ?5 times sit to stand: 38 ?Timed up and go (TUG): 18 ?Berg Balance Scale: 21/56 ? ?GAIT: ?Distance walked: 300 ?Assistive device utilized: None ?Level of assistance: Complete  Independence ?Comments: decreased stride length. Upon initiation of gait cadence slowed ? ? ? ?TODAY'S TREATMENT  ?Evaluation; pt education ? ? ?PATIENT EDUCATION:  ?Education details: Propertires of water, benefit of aquatic therapy ?Person educated:  pt and mother ?Education method: Explanation ?Education comprehension: verbalized understanding ? ? ?HOME EXERCISE PROGRAM: ?Aquatic  HEP to be assigned ? ?ASSESSMENT: ? ?CLINICAL IMPRESSION: ?Patient is a 36 y.o. F who was seen today for physical therapy evaluation and treatment for myalgia. She is also being seen for [facility] Pt has had hx of pain sx potentially due to a connective tissue disorder. She presents frail, slight frame ~ 97lbs.  Gastroparesis inhibiting improved eating patterns.  Pt not working. Good days she may be able to walk for 30 minutes, bad days in bed.  She will benefit from skilled aquatic PT to improve strength, toleration to activity and enhance motility of gut through movement using properties of water to facilitate progression towards goals decreasing external factors. ? ? ?OBJECTIVE IMPAIRMENTS decreased activity tolerance, decreased balance, decreased endurance, decreased mobility, and decreased strength.  ? ?ACTIVITY LIMITATIONS cleaning, community activity, driving, meal prep, occupation, laundry, yard work, and shopping.  ? ?PERSONAL FACTORS Age, Behavior pattern, and 1-2 comorbidities: IBS, malnutrition  are also affecting patient's functional outcome.  ? ? ?REHAB POTENTIAL: fair ? ?CLINICAL DECISION MAKING: Evolving/moderate complexity ? ?EVALUATION COMPLEXITY: Moderate ? ? ?GOALS: ?Goals reviewed with patient? Yes ? ?SHORT TERM GOALS: Target date: 04/04/2022 ? ?Pt will tolerate >30 minute aquatic session with rest periods as needed to demonstrate improved toleration to activity ?Baseline:Not yet completed ?Goal status: INITIAL ? ?2.  Pt will be indep with initial aquatic HEP with access to a  pool ?Baseline: no HEP ?Goal status: INITIAL ? ?3.  Pt will improve on LE strength up 1/2 to 1 full grade ?Baseline:3/5 ?Goal status: INITIAL ? ? ?LONG TERM GOALS: Target date: 05/02/2022 ? ?Pt will improve on [employer]

## 2022-03-08 ENCOUNTER — Ambulatory Visit (INDEPENDENT_AMBULATORY_CARE_PROVIDER_SITE_OTHER): Payer: Medicaid Other | Admitting: Clinical

## 2022-03-08 DIAGNOSIS — F4310 Post-traumatic stress disorder, unspecified: Secondary | ICD-10-CM

## 2022-03-08 DIAGNOSIS — F909 Attention-deficit hyperactivity disorder, unspecified type: Secondary | ICD-10-CM | POA: Diagnosis not present

## 2022-03-09 NOTE — BH Specialist Note (Addendum)
Integrated Behavioral Health via Telemedicine Visit ? ?03/23/2022 ?[name] ?[id number] ? ?Number of [place] Clinician visits: Additional Visit ? ?Session Start time: 9629 ?  ?Session End time: 5284 ? ?Total time in minutes: 34 ? ? ?Referring Provider: Lynnda Shields, MD ?Patient/Family location: Home ?[facility] Provider location: Center for Rachel [name] at [facility] ? ?All persons participating in visit: [name] and [place]  ? ?Types of Service: Individual psychotherapy and Telephone visit ? ?I connected with Rachel [name] [name]  via  Telephone or Video Enabled Telemedicine Application  (Video is Tree surgeon) and verified that I am speaking with the correct person using two identifiers. Discussed confidentiality: Yes  ? ?I discussed the limitations of telemedicine and the availability of in person appointments.  Discussed there is a possibility of technology failure and discussed alternative modes of communication if that failure occurs. ? ?I discussed that engaging in this telemedicine visit, they consent to the provision of behavioral healthcare and the services will be billed under their insurance. ? ?Patient and/or legal guardian expressed understanding and consented to Telemedicine visit: Yes  ? ?Presenting Concerns: ?Patient and/or family reports the following symptoms/concerns: Night sweats, hot flashes, exhaustion, brain fog, back and hip pain (all attributed to hormonal changes post-hysterectomy); need to process relationship issues and behavioral patterns.  ?Duration of problem: Ongoing; Severity of problem: moderate ? ?Patient and/or Family's Strengths/Protective Factors: ?Social connections, Social and Patent attorney, Concrete supports in place (healthy food, safe environments, etc.), Sense of purpose, and Physical Health (exercise, healthy diet, medication compliance,  etc.) ? ?Goals Addressed: ?Patient will: ? Reduce symptoms of: anxiety and stress   ? ?Progress towards Goals: ?Ongoing ? ?Interventions: ?Interventions utilized:  Armed forces logistics/support/administrative officer ?Standardized Assessments completed: Not Needed ? ?Patient and/or Family Response: Patient agrees with treatment [name]. ? ? ?Assessment: ?Patient currently experiencing PTSD, ADHD (both previous diagnosis); Psychosocial stress.  ? ?Patient may benefit from continued therapy. ? ?[name]: ?Follow up with behavioral health clinician on : Two weeks ?Behavioral recommendations:  ?-Continue [name] to begin aquatic therapy tomorrow ?-Cut contact with all of ex's family members; remember that you are not responsible for anyone else's feelings or emotional response ?-Follow medical provider advise regarding symptoms related to hormone changes  ?Referral(s): Rachel [name] (In Clinic) ? ?I discussed the assessment and treatment [name] with the patient and/or parent/guardian. They were provided an opportunity to ask questions and all were answered. They agreed with the [name] and demonstrated an understanding of the instructions. ?  ?They were advised to call back or seek an in-person evaluation if the symptoms worsen or if the condition fails to improve as anticipated. ? ?Rachel Fair, LCSW ? ? ?  03/06/2022  ?  9:40 AM 03/06/2022  ?  9:39 AM 01/30/2022  ?  9:56 AM 01/11/2022  ?  3:39 PM 10/19/2021  ? 11:19 AM  ?Depression screen PHQ 2/9  ?Decreased Interest 0 0 0 0 1  ?Down, Depressed, Hopeless 0 0 0 0 1  ?PHQ - 2 Score 0 0 0 0 2  ?Altered sleeping   '3 2 1  '$ ?Tired, decreased energy   2 1 0  ?Change in appetite   2 2 0  ?Feeling bad or failure about yourself    0 0 1  ?Trouble concentrating   '1 2 1  '$ ?Moving slowly or fidgety/restless   0 0 0  ?Suicidal thoughts   0 0 0  ?PHQ-9  Score   '8 7 5  '$ ? ? ?  01/30/2022  ?  9:56 AM 10/19/2021  ? 11:25 AM 06/20/2021  ?  8:45 AM 06/17/2021  ?  1:47 PM  ?GAD 7 : Generalized Anxiety Score  ?Nervous,  Anxious, on Edge 0 '1 1 1  '$ ?Control/stop worrying 0 0 0 1  ?Worry too much - different things 0 1 0 0  ?Trouble relaxing '1 1 1 '$ 0  ?Restless 0 0 0 0  ?Easily annoyed or irritable 0 1 0 0  ?Afraid - awful might happen 0 1 0 0  ?Total GAD 7 Score '1 5 2 2  '$ ? ? ? ?

## 2022-03-10 ENCOUNTER — Encounter: Payer: Self-pay | Admitting: Physical Therapy

## 2022-03-10 ENCOUNTER — Ambulatory Visit: Payer: Medicaid Other | Admitting: Physical Therapy

## 2022-03-10 DIAGNOSIS — M6281 Muscle weakness (generalized): Secondary | ICD-10-CM

## 2022-03-10 DIAGNOSIS — R279 Unspecified lack of coordination: Secondary | ICD-10-CM | POA: Diagnosis not present

## 2022-03-10 DIAGNOSIS — M62838 Other muscle spasm: Secondary | ICD-10-CM

## 2022-03-10 NOTE — Therapy (Signed)
?OUTPATIENT PHYSICAL THERAPY TREATMENT NOTE ? ? ?Patient Name: Rachel [name] ?MRN: [id number] ?DOB:[birth year], 36 y.o., female ?Today's Date: 03/10/2022 ? ?PCP: Charlott Rakes, MD ?REFERRING PROVIDER: Mauri Pole, MD ? ?END OF SESSION:  ? PT End of Session - 03/10/22 7062   ? ? Visit Number 4   ? Date for PT Re-Evaluation 04/26/22   ? Authorization Type medicaid healthy blue   ? PT Start Time 0801   ? PT Stop Time [phone]   ? PT Time Calculation (min) 41 min   ? Activity Tolerance Patient tolerated treatment well   ? Behavior During Therapy [facility] for tasks assessed/performed   ? ?  ?  ? ?  ? ? ? ?Past Medical History:  ?Diagnosis Date  ? ADHD (attention deficit hyperactivity disorder)   ? Anemia   ? Anxiety   ? Asthma   ? Bipolar disorder ([place])   ? Complication of anesthesia   ? slow to wake up   ? Delayed gastric emptying 02/23/2020  ? Depression   ? DUB (dysfunctional uterine bleeding) 02/23/2020  ? Endometriosis   ? GERD (gastroesophageal reflux disease)   ? Heart murmur   ? Hiatal hernia 04/06/2020  ? Myalgia 04/06/2020  ? Parasitic infection 02/23/2020  ? Pneumonia   ? Poor appetite 02/23/2020  ? RUQ pain 02/23/2020  ? Vitamin D deficiency   ? Weight loss 02/23/2020  ? ?Past Surgical History:  ?Procedure Laterality Date  ? barium study    ? COLONOSCOPY    ? CYSTOSCOPY N/A 07/12/2021  ? Procedure: CYSTOSCOPY;  Surgeon: [name], MD;  Location: [place];  Service: Gynecology;  Laterality: N/A;  ? ESOPHAGOGASTRODUODENOSCOPY  2011  ? and a ph study as well.   ? LAPAROSCOPY N/A 01/26/2021  ? Procedure: LAPAROSCOPY DIAGNOSTIC;  Surgeon: [name], MD;  Location: [place];  Service: Gynecology;  Laterality: N/A;  ? NASAL SEPTUM SURGERY    ? TONGUE FLAP    ? TOTAL LAPAROSCOPIC HYSTERECTOMY WITH SALPINGECTOMY Bilateral 07/12/2021  ? Procedure: TOTAL LAPAROSCOPIC HYSTERECTOMY WITH SALPINGECTOMY, OOPHORECTOMY;  Surgeon: [name], MD;  Location: [place];  Service: Gynecology;   Laterality: Bilateral;  ? UPPER GASTROINTESTINAL ENDOSCOPY    ? WISDOM TOOTH EXTRACTION    ? ?Patient Active Problem List  ? Diagnosis Date Noted  ? Iron deficiency anemia 01/20/2022  ? Acute hyponatremia 01/18/2022  ? Pyelonephritis 01/18/2022  ? Protein-calorie malnutrition, severe 01/18/2022  ? Pyelonephritis of left kidney 01/17/2022  ? Sepsis ([place]) 01/17/2022  ? Chronic abdominal pain 01/17/2022  ? Dyspnea 01/17/2022  ? Underweight 01/17/2022  ? Irritable bowel syndrome (IBS) 01/17/2022  ? Hypokalemia 01/17/2022  ? Vasomotor symptoms due to menopause 08/17/2021  ? S/P laparoscopic hysterectomy 07/12/2021  ? Preoperative exam for gynecologic surgery 06/20/2021  ? Adverse drug reaction 06/01/2021  ? Draining postoperative wound 02/28/2021  ? Encounter for postoperative care 02/16/2021  ? Endometriosis determined by laparoscopy   ? Pelvic pain in female 10/11/2020  ? Presence of subdermal contraceptive implant 10/11/2020  ? Dysmenorrhea 10/11/2020  ? Dyspareunia in female 10/11/2020  ? NSVT (nonsustained ventricular tachycardia) ([place]) 09/03/2020  ? 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 Palpitations 07/26/2020  ? Family history of connective tissue disease 07/26/2020  ? Myalgia 04/06/2020  ? Hiatal hernia 04/06/2020  ? Parasitic infection 02/23/2020  ? Weight loss 02/23/2020  ? Poor appetite 02/23/2020  ? RUQ pain 02/23/2020  ? Delayed gastric emptying 02/23/2020  ? DUB (dysfunctional uterine bleeding) 02/23/2020  ?  Cervical radiculopathy 05/31/2018  ? Hyperalgesia 05/31/2018  ? Acid reflux 12/26/2017  ? ADHD (attention deficit hyperactivity disorder) 05/07/2012  ? Bipolar 1 disorder ([place]) 05/07/2012  ? ? ?REFERRING DIAG:  ?K59.04 (ICD-10-CM) - Chronic idiopathic constipation  ?K58.1 (ICD-10-CM) - Irritable bowel syndrome with constipation  ?M62.89 (ICD-10-CM) - Pelvic floor dysfunction  ?  ? ? ?THERAPY DIAG:  ?Muscle weakness (generalized) ? ?Unspecified lack of coordination ? ?Other muscle spasm ? ?PERTINENT  HISTORY: hysterectomy, myalgia, bipolar ? ?PRECAUTIONS: None ? ?SUBJECTIVE: My BM's are backed up today.  5 days ago had small and liquid BM ? ?PAIN:  ?Are you having pain? Yes ?NPRS scale: 7/10 ?Pain location: abdomen ?Pain orientation: Other: general   ?PAIN TYPE: pressure and gnawing and bloating ?Pain description: intermittent  ?Aggravating factors: being backed up ?Relieving factors: lying down is better ? ? ?LE ROM: ?                        Hypermobilty bil hips ?  ?LE MMT: ?Hip adduction 4/5 bil; ext 4/5 bil ?  ?PELVIC MMT: ?  ?MMT   ?02/01/2022  ?Vaginal    ?Internal Anal Sphincter    ?External Anal Sphincter    ?Puborectalis    ?Diastasis Recti    ?1/5 MMT rectal assessment with 2 sec hold; very slow to contract and relax only does 1 rep in 10 seconds ?  ?      PALPATION: ?  ?  ?              External Perineal Exam high tone, flexed coccyx ?              ?              Internal Pelvic Floor see above and very TTP with light touch, unable to palpate beyond puborectalis ?  ?TONE: ?High with weakness ?  ?  ?  ?TODAY'S TREATMENT  ?Treatment: 03/10/22 ?Exercises  ?Rock pose for mild inversion ?Manual ?Abdominal release and mobility of ascending, descending, sigmoid colon ?Lumbar and thoracic paraspinals ?Trigger Point Dry-Needling  ?Treatment instructions: Expect mild to moderate muscle soreness. S/S of pneumothorax if dry needled over a lung field, and to seek immediate medical attention should they occur. Patient verbalized understanding of these instructions and education. ? ?Patient Consent Given: Yes ?Education handout provided: Yes ?Muscles treated: lumbar and thoracic multifidi ?Electrical stimulation performed: No ?Parameters: N/A ?Treatment response/outcome: twitch Rt thoracic; palpated increased soft tissue length throughout ? ? ?Treatment: 02/24/22 ?Exercises  ?Yoga block with hip IR or ER - kegels ?Manual ?Abdominal release and mobility of stomach, liver, ascending, descending, sigmoid colon ? ?Nuero  Re-ed ?Education and cues for coordination of breathing and pelvic floor muscle contracting and relaxing at appropriate times ? ?  ?PATIENT EDUCATION:  ?Education details: dilators and initial HEP ?Person educated: Patient and Caregiver ?Education method: Explanation, Demonstration, Tactile cues, Verbal cues, and Handouts ?Education comprehension: verbalized understanding and returned demonstration ?  ?  ?HOME EXERCISE PROGRAM: ?  ?Access Code: [facility] ?URL: [email or web address] ?Date: 02/01/2022 ?Prepared by: Jari Favre ?  ?Exercises ?- Ball squeeze with Kegel  - 3 x daily - 7 x weekly - 1 sets - 10 reps - 3 sec hold ?  ?  ?ASSESSMENT: ?  ?CLINICAL IMPRESSION: ?Today's session focused on improved soft tissue mobility due to pt feeling very backed up.  Exercise demo to help muscles in pelvic floor and abdomen relax was introduced.  Pt had  a good amount of movement in colon palpated with fascial release.  This may have been improved due to being done after dry needling.  Pt will benefit from skilled PT to continue to work on muscle length and coordination for improved BM. ?  ?  ?  ?OBJECTIVE IMPAIRMENTS decreased coordination, decreased endurance, decreased strength, and impaired tone.  ?  ?ACTIVITY LIMITATIONS community activity and toileting  .  ?  ?PERSONAL FACTORS Past/current experiences, Time since onset of injury/illness/exacerbation, and 3+ comorbidities: slow transit bowels, myalgia, IBS  are also affecting patient's functional outcome.  ?  ?  ?REHAB POTENTIAL: Good ?  ?CLINICAL DECISION MAKING: Unstable/unpredictable ?  ?EVALUATION COMPLEXITY: Moderate ?  ?  ?GOALS: ?Goals reviewed with patient? Yes ?  ?SHORT TERM GOALS: Target date: 04/26/2022 ?  ?Ind with initial HEP ?Baseline:  ?Goal status: met (03/10/22) ? ?  ?  ?LONG TERM GOALS: Target date: 04/26/2022 ?  ?Pt will be independent with advanced HEP to maintain improvements made throughout therapy ?  ?Baseline:  ?Goal status:  ongoing ?  ?2.  Pt will report 2 solid BMs per week due to improved muscle tone and coordination with BMs.  ?Baseline: 1 small this week ?Goal status: ongoing ?  ?3.  Pt will report her BMs are complete due to imp

## 2022-03-14 ENCOUNTER — Encounter: Payer: Self-pay | Admitting: Physical Therapy

## 2022-03-14 ENCOUNTER — Ambulatory Visit: Payer: Medicaid Other | Attending: Gastroenterology | Admitting: Physical Therapy

## 2022-03-14 DIAGNOSIS — R279 Unspecified lack of coordination: Secondary | ICD-10-CM | POA: Diagnosis not present

## 2022-03-14 DIAGNOSIS — M62838 Other muscle spasm: Secondary | ICD-10-CM | POA: Diagnosis not present

## 2022-03-14 DIAGNOSIS — M6281 Muscle weakness (generalized): Secondary | ICD-10-CM | POA: Insufficient documentation

## 2022-03-14 NOTE — Therapy (Signed)
?OUTPATIENT PHYSICAL THERAPY TREATMENT NOTE ? ? ?Patient Name: Rachel [name] ?MRN: [id number] ?DOB:05-28-1986, 36 y.o., female ?Today's Date: 03/14/2022 ? ?PCP: Charlott Rakes, MD ?REFERRING PROVIDER: Mauri Pole, MD ? ?END OF SESSION:  ? PT End of Session - 03/14/22 1535   ? ? Visit Number 5   ? Date for PT Re-Evaluation 04/26/22   ? Authorization Type medicaid healthy blue   ? PT Start Time 1530   ? PT Stop Time 1615   ? PT Time Calculation (min) 45 min   ? Activity Tolerance Patient tolerated treatment well   ? Behavior During Therapy [facility] for tasks assessed/performed   ? ?  ?  ? ?  ? ? ? ? ?Past Medical History:  ?Diagnosis Date  ? ADHD (attention deficit hyperactivity disorder)   ? Anemia   ? Anxiety   ? Asthma   ? Bipolar disorder ([place])   ? Complication of anesthesia   ? slow to wake up   ? Delayed gastric emptying 02/23/2020  ? Depression   ? DUB (dysfunctional uterine bleeding) 02/23/2020  ? Endometriosis   ? GERD (gastroesophageal reflux disease)   ? Heart murmur   ? Hiatal hernia 04/06/2020  ? Myalgia 04/06/2020  ? Parasitic infection 02/23/2020  ? Pneumonia   ? Poor appetite 02/23/2020  ? RUQ pain 02/23/2020  ? Vitamin D deficiency   ? Weight loss 02/23/2020  ? ?Past Surgical History:  ?Procedure Laterality Date  ? barium study    ? COLONOSCOPY    ? CYSTOSCOPY N/A 07/12/2021  ? Procedure: CYSTOSCOPY;  Surgeon: [name], MD;  Location: [place];  Service: Gynecology;  Laterality: N/A;  ? ESOPHAGOGASTRODUODENOSCOPY  2011  ? and a ph study as well.   ? LAPAROSCOPY N/A 01/26/2021  ? Procedure: LAPAROSCOPY DIAGNOSTIC;  Surgeon: [name], MD;  Location: [place];  Service: Gynecology;  Laterality: N/A;  ? NASAL SEPTUM SURGERY    ? TONGUE FLAP    ? TOTAL LAPAROSCOPIC HYSTERECTOMY WITH SALPINGECTOMY Bilateral 07/12/2021  ? Procedure: TOTAL LAPAROSCOPIC HYSTERECTOMY WITH SALPINGECTOMY, OOPHORECTOMY;  Surgeon: [name], MD;  Location: [place];  Service: Gynecology;   Laterality: Bilateral;  ? UPPER GASTROINTESTINAL ENDOSCOPY    ? WISDOM TOOTH EXTRACTION    ? ?Patient Active Problem List  ? Diagnosis Date Noted  ? Iron deficiency anemia 01/20/2022  ? Acute hyponatremia 01/18/2022  ? Pyelonephritis 01/18/2022  ? Protein-calorie malnutrition, severe 01/18/2022  ? Pyelonephritis of left kidney 01/17/2022  ? Sepsis ([place]) 01/17/2022  ? Chronic abdominal pain 01/17/2022  ? Dyspnea 01/17/2022  ? Underweight 01/17/2022  ? Irritable bowel syndrome (IBS) 01/17/2022  ? Hypokalemia 01/17/2022  ? Vasomotor symptoms due to menopause 08/17/2021  ? S/P laparoscopic hysterectomy 07/12/2021  ? Preoperative exam for gynecologic surgery 06/20/2021  ? Adverse drug reaction 06/01/2021  ? Draining postoperative wound 02/28/2021  ? Encounter for postoperative care 02/16/2021  ? Endometriosis determined by laparoscopy   ? Pelvic pain in female 10/11/2020  ? Presence of subdermal contraceptive implant 10/11/2020  ? Dysmenorrhea 10/11/2020  ? Dyspareunia in female 10/11/2020  ? NSVT (nonsustained ventricular tachycardia) ([place]) 09/03/2020  ? 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 Palpitations 07/26/2020  ? Family history of connective tissue disease 07/26/2020  ? Myalgia 04/06/2020  ? Hiatal hernia 04/06/2020  ? Parasitic infection 02/23/2020  ? Weight loss 02/23/2020  ? Poor appetite 02/23/2020  ? RUQ pain 02/23/2020  ? Delayed gastric emptying 02/23/2020  ? DUB (dysfunctional uterine bleeding) 02/23/2020  ?  Cervical radiculopathy 05/31/2018  ? Hyperalgesia 05/31/2018  ? Acid reflux 12/26/2017  ? ADHD (attention deficit hyperactivity disorder) 05/07/2012  ? Bipolar 1 disorder ([place]) 05/07/2012  ? ? ?REFERRING DIAG:  ?K59.04 (ICD-10-CM) - Chronic idiopathic constipation  ?K58.1 (ICD-10-CM) - Irritable bowel syndrome with constipation  ?M62.89 (ICD-10-CM) - Pelvic floor dysfunction  ?  ? ? ?THERAPY DIAG:  ?Muscle weakness (generalized) ? ?Unspecified lack of coordination ? ?Other muscle spasm ? ?PERTINENT  HISTORY: hysterectomy, myalgia, bipolar ? ?PRECAUTIONS: None ? ?SUBJECTIVE: My BM's are backed up today.  5 days ago had small and liquid BM ? ?PAIN:  ?Are you having pain? Yes ?NPRS scale: 7/10 ?Pain location: abdomen ?Pain orientation: Other: general   ?PAIN TYPE: pressure and gnawing and bloating ?Pain description: intermittent  ?Aggravating factors: being backed up ?Relieving factors: lying down is better ? ? ?LE ROM: ?                        Hypermobilty bil hips ?  ?LE MMT: ?Hip adduction 4/5 bil; ext 4/5 bil ?  ?PELVIC MMT: ?  ?MMT   ?02/01/2022  ?Vaginal    ?Internal Anal Sphincter    ?External Anal Sphincter    ?Puborectalis    ?Diastasis Recti    ?1/5 MMT rectal assessment with 2 sec hold; very slow to contract and relax only does 1 rep in 10 seconds ?  ?      PALPATION: ?  ?  ?              External Perineal Exam high tone, flexed coccyx ?              ?              Internal Pelvic Floor see above and very TTP with light touch, unable to palpate beyond puborectalis ?  ?TONE: ?High with weakness ?  ?  ?  ?TODAY'S TREATMENT  ?Treatment: 03/14/22 ? ?Manual ?Abdominal release and mobility of ascending, descending colon, stomach ?Lumbar and thoracic paraspinals ?Trigger Point Dry-Needling  ?Treatment instructions: Expect mild to moderate muscle soreness. S/S of pneumothorax if dry needled over a lung field, and to seek immediate medical attention should they occur. Patient verbalized understanding of these instructions and education. ? ?Patient Consent Given: Yes ?Education handout provided: Previously provided ?Muscles treated: lumbar and thoracic multifidi ?Electrical stimulation performed: No ?Parameters: N/A ?Treatment response/outcome: twitch and muscle length improved ? ? ? ?Treatment: 03/10/22 ?Exercises  ?Rock pose for mild inversion ?Manual ?Abdominal release and mobility of ascending, descending, sigmoid colon ?Lumbar and thoracic paraspinals ?Trigger Point Dry-Needling  ?Treatment instructions: Expect  mild to moderate muscle soreness. S/S of pneumothorax if dry needled over a lung field, and to seek immediate medical attention should they occur. Patient verbalized understanding of these instructions and education. ? ?Patient Consent Given: Yes ?Education handout provided: Yes ?Muscles treated: lumbar and thoracic multifidi ?Electrical stimulation performed: No ?Parameters: N/A ?Treatment response/outcome: twitch Rt thoracic; palpated increased soft tissue length throughout ? ? ?Treatment: 02/24/22 ?Exercises  ?Yoga block with hip IR or ER - kegels ?Manual ?Abdominal release and mobility of stomach, liver, ascending, descending, sigmoid colon ? ?Nuero Re-ed ?Education and cues for coordination of breathing and pelvic floor muscle contracting and relaxing at appropriate times ? ?  ?PATIENT EDUCATION:  ?Education details: dilators and initial HEP ?Person educated: Patient and Caregiver ?Education method: Explanation, Demonstration, Tactile cues, Verbal cues, and Handouts ?Education comprehension: verbalized understanding and returned demonstration ?  ?  ?  HOME EXERCISE PROGRAM: ?  ?Access Code: [facility] ?URL: [email or web address] ?Date: 02/01/2022 ?Prepared by: Jari Favre ?  ?Exercises ?- Ball squeeze with Kegel  - 3 x daily - 7 x weekly - 1 sets - 10 reps - 3 sec hold ?  ?  ?ASSESSMENT: ?  ?CLINICAL IMPRESSION: ?Today's session focused on improved soft tissue mobility again due to good response from last time, but still experiencing a lot of pressure today.  Pt had more tension in upper abdomen and stomach as well as left ribcage had good release. Responded well to dry needling and STM to lumbar and thoracic and STM to lengthen tissues.  Pt will benefit from skilled PT to continue to work on muscle length and coordination for improved BM. ?  ?  ?  ?OBJECTIVE IMPAIRMENTS decreased coordination, decreased endurance, decreased strength, and impaired tone.  ?  ?ACTIVITY LIMITATIONS community  activity and toileting  .  ?  ?PERSONAL FACTORS Past/current experiences, Time since onset of injury/illness/exacerbation, and 3+ comorbidities: slow transit bowels, myalgia, IBS  are also affecting patient's f

## 2022-03-15 ENCOUNTER — Other Ambulatory Visit: Payer: Self-pay | Admitting: Obstetrics and Gynecology

## 2022-03-15 DIAGNOSIS — N951 Menopausal and female climacteric states: Secondary | ICD-10-CM

## 2022-03-20 ENCOUNTER — Encounter: Payer: Self-pay | Admitting: Physical Therapy

## 2022-03-20 ENCOUNTER — Ambulatory Visit: Payer: Medicaid Other | Admitting: Physical Therapy

## 2022-03-20 DIAGNOSIS — R279 Unspecified lack of coordination: Secondary | ICD-10-CM | POA: Diagnosis not present

## 2022-03-20 DIAGNOSIS — M62838 Other muscle spasm: Secondary | ICD-10-CM

## 2022-03-20 DIAGNOSIS — M6281 Muscle weakness (generalized): Secondary | ICD-10-CM

## 2022-03-20 NOTE — Therapy (Signed)
?OUTPATIENT PHYSICAL THERAPY TREATMENT NOTE ? ? ?Patient Name: Rachel [name] ?MRN: [id number] ?DOB:1986/01/26, 36 y.o., female ?Today's Date: 03/20/2022 ? ?PCP: Charlott Rakes, MD ?REFERRING PROVIDER: Mauri Pole, MD ? ?END OF SESSION:  ? PT End of Session - 03/20/22 1712   ? ? Visit Number 6   ? Date for PT Re-Evaluation 04/26/22   ? Authorization Type medicaid healthy blue   ? PT Start Time 0272   ? PT Stop Time 1657   ? PT Time Calculation (min) 40 min   ? Activity Tolerance Patient tolerated treatment well   ? Behavior During Therapy [facility] for tasks assessed/performed   ? ?  ?  ? ?  ? ? ? ? ? ?Past Medical History:  ?Diagnosis Date  ? ADHD (attention deficit hyperactivity disorder)   ? Anemia   ? Anxiety   ? Asthma   ? Bipolar disorder ([place])   ? Complication of anesthesia   ? slow to wake up   ? Delayed gastric emptying 02/23/2020  ? Depression   ? DUB (dysfunctional uterine bleeding) 02/23/2020  ? Endometriosis   ? GERD (gastroesophageal reflux disease)   ? Heart murmur   ? Hiatal hernia 04/06/2020  ? Myalgia 04/06/2020  ? Parasitic infection 02/23/2020  ? Pneumonia   ? Poor appetite 02/23/2020  ? RUQ pain 02/23/2020  ? Vitamin D deficiency   ? Weight loss 02/23/2020  ? ?Past Surgical History:  ?Procedure Laterality Date  ? barium study    ? COLONOSCOPY    ? CYSTOSCOPY N/A 07/12/2021  ? Procedure: CYSTOSCOPY;  Surgeon: [name], MD;  Location: [place];  Service: Gynecology;  Laterality: N/A;  ? ESOPHAGOGASTRODUODENOSCOPY  2011  ? and a ph study as well.   ? LAPAROSCOPY N/A 01/26/2021  ? Procedure: LAPAROSCOPY DIAGNOSTIC;  Surgeon: [name], MD;  Location: [place];  Service: Gynecology;  Laterality: N/A;  ? NASAL SEPTUM SURGERY    ? TONGUE FLAP    ? TOTAL LAPAROSCOPIC HYSTERECTOMY WITH SALPINGECTOMY Bilateral 07/12/2021  ? Procedure: TOTAL LAPAROSCOPIC HYSTERECTOMY WITH SALPINGECTOMY, OOPHORECTOMY;  Surgeon: [name], MD;  Location: [place];  Service: Gynecology;   Laterality: Bilateral;  ? UPPER GASTROINTESTINAL ENDOSCOPY    ? WISDOM TOOTH EXTRACTION    ? ?Patient Active Problem List  ? Diagnosis Date Noted  ? Iron deficiency anemia 01/20/2022  ? Acute hyponatremia 01/18/2022  ? Pyelonephritis 01/18/2022  ? Protein-calorie malnutrition, severe 01/18/2022  ? Pyelonephritis of left kidney 01/17/2022  ? Sepsis ([place]) 01/17/2022  ? Chronic abdominal pain 01/17/2022  ? Dyspnea 01/17/2022  ? Underweight 01/17/2022  ? Irritable bowel syndrome (IBS) 01/17/2022  ? Hypokalemia 01/17/2022  ? Vasomotor symptoms due to menopause 08/17/2021  ? S/P laparoscopic hysterectomy 07/12/2021  ? Preoperative exam for gynecologic surgery 06/20/2021  ? Adverse drug reaction 06/01/2021  ? Draining postoperative wound 02/28/2021  ? Encounter for postoperative care 02/16/2021  ? Endometriosis determined by laparoscopy   ? Pelvic pain in female 10/11/2020  ? Presence of subdermal contraceptive implant 10/11/2020  ? Dysmenorrhea 10/11/2020  ? Dyspareunia in female 10/11/2020  ? NSVT (nonsustained ventricular tachycardia) ([place]) 09/03/2020  ? 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 Palpitations 07/26/2020  ? Family history of connective tissue disease 07/26/2020  ? Myalgia 04/06/2020  ? Hiatal hernia 04/06/2020  ? Parasitic infection 02/23/2020  ? Weight loss 02/23/2020  ? Poor appetite 02/23/2020  ? RUQ pain 02/23/2020  ? Delayed gastric emptying 02/23/2020  ? DUB (dysfunctional uterine bleeding) 02/23/2020  ?  Cervical radiculopathy 05/31/2018  ? Hyperalgesia 05/31/2018  ? Acid reflux 12/26/2017  ? ADHD (attention deficit hyperactivity disorder) 05/07/2012  ? Bipolar 1 disorder ([place]) 05/07/2012  ? ? ?REFERRING DIAG:  ?K59.04 (ICD-10-CM) - Chronic idiopathic constipation  ?K58.1 (ICD-10-CM) - Irritable bowel syndrome with constipation  ?M62.89 (ICD-10-CM) - Pelvic floor dysfunction  ?  ? ? ?THERAPY DIAG:  ?Muscle weakness (generalized) ? ?Unspecified lack of coordination ? ?Other muscle spasm ? ?PERTINENT  HISTORY: hysterectomy, myalgia, bipolar ? ?PRECAUTIONS: None ? ?SUBJECTIVE: My BM are getting better and had 2 this week instead of just one. I feel like the abdominal fascial release and the dry needling are helping ?PAIN:  ?Are you having pain? Yes ?NPRS scale: 7/10 ?Pain location: abdomen ?Pain orientation: Other: general   ?PAIN TYPE: pressure and gnawing and bloating ?Pain description: intermittent  ?Aggravating factors: being backed up ?Relieving factors: lying down is better ? ? ?LE ROM: ?                        Hypermobilty bil hips ?  ?LE MMT: ?Hip adduction 4/5 bil; ext 4/5 bil ?  ?PELVIC MMT: ?  ?MMT   ?02/01/2022  ?Vaginal    ?Internal Anal Sphincter    ?External Anal Sphincter    ?Puborectalis    ?Diastasis Recti    ?1/5 MMT rectal assessment with 2 sec hold; very slow to contract and relax only does 1 rep in 10 seconds ?  ?      PALPATION: ?  ?  ?              External Perineal Exam high tone, flexed coccyx ?              ?              Internal Pelvic Floor see above and very TTP with light touch, unable to palpate beyond puborectalis ?  ?TONE: ?High with weakness ?  ?  ?  ?TODAY'S TREATMENT  ?Treatment: 03/20/22 ? ?Manual ?Abdominal release and mobility of ascending, descending colon, stomach ?Lumbar and thoracic paraspinals ? ?Trigger Point Dry-Needling  ?Treatment instructions: Expect mild to moderate muscle soreness. S/S of pneumothorax if dry needled over a lung field, and to seek immediate medical attention should they occur. Patient verbalized understanding of these instructions and education. ? ?Patient Consent Given: Yes ?Education handout provided: Previously provided ?Muscles treated: lumbar and thoracic multifidi ?Electrical stimulation performed: No ?Parameters: N/A ?Treatment response/outcome: increased soft tissue length palpated ? ? ?Treatment: 03/14/22 ? ?Manual ?Abdominal release and mobility of ascending, descending colon, stomach ?Lumbar and thoracic paraspinals ?Trigger Point  Dry-Needling  ?Treatment instructions: Expect mild to moderate muscle soreness. S/S of pneumothorax if dry needled over a lung field, and to seek immediate medical attention should they occur. Patient verbalized understanding of these instructions and education. ? ?Patient Consent Given: Yes ?Education handout provided: Previously provided ?Muscles treated: lumbar and thoracic multifidi ?Electrical stimulation performed: No ?Parameters: N/A ?Treatment response/outcome: twitch and muscle length improved ? ? ? ?Treatment: 03/10/22 ?Exercises  ?Rock pose for mild inversion ?Manual ?Abdominal release and mobility of ascending, descending, sigmoid colon ?Lumbar and thoracic paraspinals ?Trigger Point Dry-Needling  ?Treatment instructions: Expect mild to moderate muscle soreness. S/S of pneumothorax if dry needled over a lung field, and to seek immediate medical attention should they occur. Patient verbalized understanding of these instructions and education. ? ?Patient Consent Given: Yes ?Education handout provided: Yes ?Muscles treated: lumbar and thoracic  multifidi ?Electrical stimulation performed: No ?Parameters: N/A ?Treatment response/outcome: twitch Rt thoracic; palpated increased soft tissue length throughout ? ? ? ?  ?PATIENT EDUCATION:  ?Education details: dilators and initial HEP ?Person educated: Patient and Caregiver ?Education method: Explanation, Demonstration, Tactile cues, Verbal cues, and Handouts ?Education comprehension: verbalized understanding and returned demonstration ?  ?  ?HOME EXERCISE PROGRAM: ?  ?Access Code: [facility] ?URL: [email or web address] ?Date: 02/01/2022 ?Prepared by: Jari Favre ?  ?Exercises ?- Ball squeeze with Kegel  - 3 x daily - 7 x weekly - 1 sets - 10 reps - 3 sec hold ?  ?  ?ASSESSMENT: ?  ?CLINICAL IMPRESSION: ?Today's session was still addressing tension throughout the back and abdomen as pt is making good progress with soft tissue release. Pt will  benefit from skilled PT to continue to work on strength and coordination of the pelvic floor as she is experiencing more mobility of other areas helping her BM increase in frequency. ?  ?  ?  ?OBJECTIVE IMPAIRMENTS

## 2022-03-23 ENCOUNTER — Emergency Department (HOSPITAL_BASED_OUTPATIENT_CLINIC_OR_DEPARTMENT_OTHER): Payer: Medicaid Other | Admitting: Radiology

## 2022-03-23 ENCOUNTER — Inpatient Hospital Stay (HOSPITAL_BASED_OUTPATIENT_CLINIC_OR_DEPARTMENT_OTHER)
Admission: EM | Admit: 2022-03-23 | Discharge: 2022-03-27 | DRG: 871 | Disposition: A | Discharge status: Home / Self Care | Payer: Medicaid Other | Attending: Family Medicine | Admitting: Family Medicine

## 2022-03-23 ENCOUNTER — Encounter (HOSPITAL_BASED_OUTPATIENT_CLINIC_OR_DEPARTMENT_OTHER): Payer: Self-pay

## 2022-03-23 ENCOUNTER — Emergency Department (HOSPITAL_BASED_OUTPATIENT_CLINIC_OR_DEPARTMENT_OTHER): Payer: Medicaid Other

## 2022-03-23 ENCOUNTER — Other Ambulatory Visit: Payer: Self-pay

## 2022-03-23 ENCOUNTER — Ambulatory Visit (INDEPENDENT_AMBULATORY_CARE_PROVIDER_SITE_OTHER): Payer: Medicaid Other | Admitting: Clinical

## 2022-03-23 DIAGNOSIS — R509 Fever, unspecified: Principal | ICD-10-CM

## 2022-03-23 DIAGNOSIS — Z91048 Other nonmedicinal substance allergy status: Secondary | ICD-10-CM

## 2022-03-23 DIAGNOSIS — F319 Bipolar disorder, unspecified: Secondary | ICD-10-CM | POA: Diagnosis present

## 2022-03-23 DIAGNOSIS — R110 Nausea: Secondary | ICD-10-CM | POA: Diagnosis present

## 2022-03-23 DIAGNOSIS — Z825 Family history of asthma and other chronic lower respiratory diseases: Secondary | ICD-10-CM

## 2022-03-23 DIAGNOSIS — Z8371 Family history of colonic polyps: Secondary | ICD-10-CM

## 2022-03-23 DIAGNOSIS — Z888 Allergy status to other drugs, medicaments and biological substances status: Secondary | ICD-10-CM

## 2022-03-23 DIAGNOSIS — N10 Acute tubulo-interstitial nephritis: Secondary | ICD-10-CM | POA: Diagnosis not present

## 2022-03-23 DIAGNOSIS — F909 Attention-deficit hyperactivity disorder, unspecified type: Secondary | ICD-10-CM | POA: Diagnosis present

## 2022-03-23 DIAGNOSIS — N951 Menopausal and female climacteric states: Secondary | ICD-10-CM

## 2022-03-23 DIAGNOSIS — Z681 Body mass index (BMI) 19 or less, adult: Secondary | ICD-10-CM

## 2022-03-23 DIAGNOSIS — E43 Unspecified severe protein-calorie malnutrition: Secondary | ICD-10-CM | POA: Diagnosis present

## 2022-03-23 DIAGNOSIS — G8929 Other chronic pain: Secondary | ICD-10-CM | POA: Diagnosis present

## 2022-03-23 DIAGNOSIS — K589 Irritable bowel syndrome without diarrhea: Secondary | ICD-10-CM | POA: Diagnosis present

## 2022-03-23 DIAGNOSIS — K580 Irritable bowel syndrome with diarrhea: Secondary | ICD-10-CM | POA: Diagnosis present

## 2022-03-23 DIAGNOSIS — R079 Chest pain, unspecified: Secondary | ICD-10-CM | POA: Diagnosis not present

## 2022-03-23 DIAGNOSIS — F4310 Post-traumatic stress disorder, unspecified: Secondary | ICD-10-CM

## 2022-03-23 DIAGNOSIS — R64 Cachexia: Secondary | ICD-10-CM | POA: Diagnosis present

## 2022-03-23 DIAGNOSIS — N12 Tubulo-interstitial nephritis, not specified as acute or chronic: Secondary | ICD-10-CM | POA: Diagnosis not present

## 2022-03-23 DIAGNOSIS — Z90710 Acquired absence of both cervix and uterus: Secondary | ICD-10-CM

## 2022-03-23 DIAGNOSIS — Z658 Other specified problems related to psychosocial circumstances: Secondary | ICD-10-CM

## 2022-03-23 DIAGNOSIS — Z90722 Acquired absence of ovaries, bilateral: Secondary | ICD-10-CM

## 2022-03-23 DIAGNOSIS — A419 Sepsis, unspecified organism: Principal | ICD-10-CM | POA: Diagnosis present

## 2022-03-23 DIAGNOSIS — Z800 Family history of malignant neoplasm of digestive organs: Secondary | ICD-10-CM

## 2022-03-23 DIAGNOSIS — Z880 Allergy status to penicillin: Secondary | ICD-10-CM

## 2022-03-23 DIAGNOSIS — Z881 Allergy status to other antibiotic agents status: Secondary | ICD-10-CM

## 2022-03-23 DIAGNOSIS — B9620 Unspecified Escherichia coli [E. coli] as the cause of diseases classified elsewhere: Secondary | ICD-10-CM | POA: Diagnosis present

## 2022-03-23 DIAGNOSIS — Z79899 Other long term (current) drug therapy: Secondary | ICD-10-CM

## 2022-03-23 DIAGNOSIS — K219 Gastro-esophageal reflux disease without esophagitis: Secondary | ICD-10-CM | POA: Diagnosis present

## 2022-03-23 DIAGNOSIS — Z818 Family history of other mental and behavioral disorders: Secondary | ICD-10-CM

## 2022-03-23 DIAGNOSIS — R109 Unspecified abdominal pain: Secondary | ICD-10-CM | POA: Diagnosis not present

## 2022-03-23 DIAGNOSIS — R531 Weakness: Secondary | ICD-10-CM

## 2022-03-23 DIAGNOSIS — Z79890 Hormone replacement therapy (postmenopausal): Secondary | ICD-10-CM

## 2022-03-23 DIAGNOSIS — Z83438 Family history of other disorder of lipoprotein metabolism and other lipidemia: Secondary | ICD-10-CM

## 2022-03-23 DIAGNOSIS — D509 Iron deficiency anemia, unspecified: Secondary | ICD-10-CM | POA: Diagnosis present

## 2022-03-23 DIAGNOSIS — E876 Hypokalemia: Secondary | ICD-10-CM | POA: Diagnosis present

## 2022-03-23 DIAGNOSIS — R000 Tachycardia, unspecified: Secondary | ICD-10-CM | POA: Diagnosis not present

## 2022-03-23 DIAGNOSIS — Z833 Family history of diabetes mellitus: Secondary | ICD-10-CM

## 2022-03-23 DIAGNOSIS — K6389 Other specified diseases of intestine: Secondary | ICD-10-CM | POA: Diagnosis not present

## 2022-03-23 DIAGNOSIS — Z8249 Family history of ischemic heart disease and other diseases of the circulatory system: Secondary | ICD-10-CM

## 2022-03-23 HISTORY — DX: Ehlers-Danlos syndrome, unspecified: Q79.60

## 2022-03-23 HISTORY — DX: Gastroparesis: K31.84

## 2022-03-23 LAB — CBC WITH DIFFERENTIAL/PLATELET
Abs Immature Granulocytes: 0.03 10*3/uL (ref 0.00–0.07)
Basophils Absolute: 0 10*3/uL (ref 0.0–0.1)
Basophils Relative: 0 %
Eosinophils Absolute: 0 10*3/uL (ref 0.0–0.5)
Eosinophils Relative: 0 %
HCT: 40.6 % (ref 36.0–46.0)
Hemoglobin: 13.8 g/dL (ref 12.0–15.0)
Immature Granulocytes: 0 %
Lymphocytes Relative: 5 %
Lymphs Abs: 0.5 10*3/uL — ABNORMAL LOW (ref 0.7–4.0)
MCH: 30.6 pg (ref 26.0–34.0)
MCHC: 34 g/dL (ref 30.0–36.0)
MCV: 90 fL (ref 80.0–100.0)
Monocytes Absolute: 0.8 10*3/uL (ref 0.1–1.0)
Monocytes Relative: 8 %
Neutro Abs: 8.6 10*3/uL — ABNORMAL HIGH (ref 1.7–7.7)
Neutrophils Relative %: 87 %
Platelets: 233 10*3/uL (ref 150–400)
RBC: 4.51 MIL/uL (ref 3.87–5.11)
RDW: 14.1 % (ref 11.5–15.5)
WBC: 10 10*3/uL (ref 4.0–10.5)
nRBC: 0 % (ref 0.0–0.2)

## 2022-03-23 LAB — URINALYSIS, ROUTINE W REFLEX MICROSCOPIC
Bilirubin Urine: NEGATIVE
Glucose, UA: NEGATIVE mg/dL
Ketones, ur: NEGATIVE mg/dL
Nitrite: POSITIVE — AB
Protein, ur: 30 mg/dL — AB
Specific Gravity, Urine: 1.016 (ref 1.005–1.030)
pH: 5.5 (ref 5.0–8.0)

## 2022-03-23 LAB — COMPREHENSIVE METABOLIC PANEL
ALT: 8 U/L (ref 0–44)
AST: 14 U/L — ABNORMAL LOW (ref 15–41)
Albumin: 4.5 g/dL (ref 3.5–5.0)
Alkaline Phosphatase: 62 U/L (ref 38–126)
Anion gap: 12 (ref 5–15)
BUN: 12 mg/dL (ref 6–20)
CO2: 22 mmol/L (ref 22–32)
Calcium: 9.8 mg/dL (ref 8.9–10.3)
Chloride: 104 mmol/L (ref 98–111)
Creatinine, Ser: 0.76 mg/dL (ref 0.44–1.00)
GFR, Estimated: >60 mL/min (ref >60)
Glucose, Bld: 119 mg/dL — ABNORMAL HIGH (ref 70–99)
Potassium: 3.3 mmol/L — ABNORMAL LOW (ref 3.5–5.1)
Sodium: 138 mmol/L (ref 135–145)
Total Bilirubin: 0.3 mg/dL (ref 0.3–1.2)
Total Protein: 7.5 g/dL (ref 6.5–8.1)

## 2022-03-23 LAB — HCG, QUANTITATIVE, PREGNANCY: hCG, Beta Chain, Quant, S: 5 m[IU]/mL — ABNORMAL HIGH (ref <5)

## 2022-03-23 LAB — LACTIC ACID, PLASMA: Lactic Acid, Venous: 0.6 mmol/L (ref 0.5–1.9)

## 2022-03-23 MED ORDER — LACTATED RINGERS IV BOLUS
1000.0000 mL | Freq: Once | INTRAVENOUS | Status: AC
  Administered 2022-03-23: 1000 mL via INTRAVENOUS

## 2022-03-23 MED ORDER — ACETAMINOPHEN 500 MG PO TABS
1000.0000 mg | ORAL_TABLET | Freq: Four times a day (QID) | ORAL | Status: DC | PRN
Start: 2022-03-23 — End: 2022-03-24
  Administered 2022-03-23: 1000 mg via ORAL
  Filled 2022-03-23: qty 2

## 2022-03-23 MED ORDER — ONDANSETRON HCL 4 MG/2ML IJ SOLN
4.0000 mg | Freq: Once | INTRAMUSCULAR | Status: AC
  Administered 2022-03-23: 4 mg via INTRAVENOUS
  Filled 2022-03-23: qty 2

## 2022-03-23 MED ORDER — SODIUM CHLORIDE 0.9 % IV SOLN
1.0000 g | Freq: Once | INTRAVENOUS | Status: AC
  Administered 2022-03-23: 1 g via INTRAVENOUS
  Filled 2022-03-23: qty 10

## 2022-03-23 MED ORDER — HYDROMORPHONE HCL 1 MG/ML IJ SOLN
1.0000 mg | Freq: Once | INTRAMUSCULAR | Status: AC
  Administered 2022-03-23: 1 mg via INTRAVENOUS
  Filled 2022-03-23: qty 1

## 2022-03-23 MED ORDER — MORPHINE SULFATE (PF) 4 MG/ML IV SOLN
4.0000 mg | Freq: Once | INTRAVENOUS | Status: AC
  Administered 2022-03-23: 4 mg via INTRAVENOUS
  Filled 2022-03-23: qty 1

## 2022-03-23 NOTE — ED Provider Notes (Signed)
?[facility] ?Provider Note ? ? ?CSN: [id number] ?Arrival date & time: 03/23/22  1908 ? ?  ? ?History ? ?Chief Complaint  ?Patient presents with  ? Fever  ? ? ?Rachel [name] is a 36 y.o. female with history of gastroparesis, pyelonephritis in March, chronic abdominal pain who presents to the ED for evaluation of back pain, abdominal pain and chest pain that began and is worsening since onset.  Pain began in the bilateral flanks yesterday, radiating up into the left lower chest and has been gradually worsening and she has been febrile throughout today.  Mother is in the room and notes that she took an oral temperature of 104.5 ?F which prompted them to come to the emergency department.  Denies dysuria, hematuria and urinary symptoms, although patient and mother note that this is how she presented when she had pyelonephritis in March.  Additionally, patient has gastroparesis and reports her last bowel movement was 2 days ago and was diarrhea.  She has taken Tylenol and ibuprofen with improvement in fever but not complete resolution.  Patient has trouble trouble speaking due to pain. ? ? ?Fever ? ?  ? ?Home Medications ?Prior to Admission medications   ?Medication Sig Start Date End Date Taking? Authorizing Provider  ?albuterol (PROVENTIL) (2.5 MG/3ML) 0.083% nebulizer solution Take 3 mLs (2.5 mg total) by nebulization every 6 (six) hours as needed for wheezing or shortness of breath. Max 4 doses per day 12/16/21   Kandra Nicolas, MD  ?albuterol (VENTOLIN HFA) 108 (90 Base) MCG/ACT inhaler Inhale 1-2 puffs into the lungs every 6 (six) hours as needed for wheezing or shortness of breath. 12/16/21   Kandra Nicolas, MD  ?Ascorbic Acid (VITAMIN C) 1000 MG tablet Take 1,000 mg by mouth 2 (two) times daily.    [provider]  ?Calcium-Magnesium-Vitamin D 600-40-500 MG-MG-UNIT TB24 Take 3 tablets by mouth in the morning and at bedtime.    [provider]  ?Cholecalciferol  (VITAMIN D3) 50 MCG (2000 UT) TABS Take 2,000 Units by mouth daily.    [provider]  ?ELDERBERRY PO Take 1 tablet by mouth daily.    [provider]  ?EPINEPHrine 0.3 mg/0.3 mL IJ SOAJ injection Inject 0.3 mg into the muscle as needed for anaphylaxis. 05/27/21   Levin Bacon, MD  ?estrogens, conjugated, (PREMARIN) 0.45 MG tablet Take 1 tablet (0.45 mg total) by mouth daily. 08/17/21   [name], MD  ?fexofenadine (ALLEGRA) 180 MG tablet Take 180 mg by mouth daily.    [provider]  ?ibuprofen (ADVIL) 600 MG tablet Take 1 tablet (600 mg total) by mouth every 6 (six) hours as needed for headache, mild pain, moderate pain or cramping. 07/13/21   [name], MD  ?Rolan Lipa 290 MCG CAPS capsule TAKE ONE CAPSULE BY MOUTH DAILY BEFORE BREAKFAST 02/28/22   Mauri Pole, MD  ?Melatonin 10 MG TABS Take 10 mg by mouth at bedtime.    [provider]  ?MIRALAX 17 GM/SCOOP powder Take 17 g by mouth See admin instructions. Mix 17 g of powder into water and drink once a day    [provider]  ?Multiple Vitamins-Minerals (ADULT ONE DAILY GUMMIES PO) Take 2 tablets by mouth daily.    [provider]  ?Omega-3 Fatty Acids (FISH OIL) 1200 MG CAPS Take 1,200 mg by mouth daily.    [provider]  ?promethazine (PHENERGAN) 12.5 MG tablet Take 12.5 mg by mouth See admin instructions. Take  12.5 mg by mouth every 6-8 hours as needed for nausea    [provider]  ?sennosides-docusate sodium (SENOKOT-S) 8.6-50 MG tablet Take 1-2 tablets by mouth daily.    [provider]  ?   ? ?Allergies    ?Amphetamine-dextroamphetamine, Lupron [leuprolide], Orilissa [elagolix], Penicillins, Chlorhexidine, Doxycycline, Olanzapine, Other, Prozac [fluoxetine hcl], Tape, Triple antibiotic pain relief [neomy-bacit-polymyx-pramoxine], and Reglan [metoclopramide]   ? ?Review of Systems   ?Review of Systems  ?Constitutional:  Positive for fever.   ? ?Physical Exam ?Updated Vital Signs ?Vitals:  ? 03/23/22 2200 03/23/22 2215  ?BP: 110/77 111/76  ?Pulse: 96 95  ?Resp: 18 18  ?Temp:    ?SpO2: 100% 100%  ? ? ?Physical Exam ?Vitals and nursing note reviewed.  ?Constitutional:   ?   General: She is not in acute distress. ?   Appearance: She is ill-appearing.  ?   Comments: Cachectic, chronically ill-appearing.  Speaking in clipped 1 or 2 word answers due to pain.  Asking mother to provide much of story  ?HENT:  ?   Head: Atraumatic.  ?Eyes:  ?   Conjunctiva/sclera: Conjunctivae normal.  ?Cardiovascular:  ?   Rate and Rhythm: Normal rate and regular rhythm.  ?   Pulses: Normal pulses.  ?   Heart sounds: No murmur heard. ?Pulmonary:  ?   Effort: Pulmonary effort is normal. No respiratory distress.  ?   Breath sounds: Normal breath sounds.  ?   Comments: Lungs clear to auscultation, normal respiratory effort ?Abdominal:  ?   General: Abdomen is flat. There is no distension.  ?   Palpations: Abdomen is soft.  ?   Tenderness: There is abdominal tenderness.  ?   Comments: Diffuse abdominal tenderness from pelvis to epigastric, CVA tenderness bilaterally  ?Musculoskeletal:     ?   General: Normal range of motion.  ?   Cervical back: Normal range of motion.  ?Skin: ?   General: Skin is warm and dry.  ?   Capillary Refill: Capillary refill takes less than 2 seconds.  ?Neurological:  ?   General: No focal deficit present.  ?   Mental Status: She is alert.  ?Psychiatric:     ?   Mood and Affect: Mood normal.  ? ? ?ED Results / Procedures / Treatments   ?Labs ?(all labs ordered are listed, but only abnormal results are displayed) ?Labs Reviewed  ?COMPREHENSIVE METABOLIC PANEL - Abnormal; Notable for the following components:  ?    Result Value  ? Potassium 3.3 (*)   ? Glucose, Bld 119 (*)   ? AST 14 (*)   ? All other components within normal limits  ?CBC WITH DIFFERENTIAL/PLATELET - Abnormal; Notable for the following components:  ? Neutro Abs 8.6 (*)   ? Lymphs Abs 0.5 (*)    ? All other components within normal limits  ?URINALYSIS, ROUTINE W REFLEX MICROSCOPIC - Abnormal; Notable for the following components:  ? Hgb urine dipstick TRACE (*)   ? Protein, ur 30 (*)   ? Nitrite POSITIVE (*)   ? Leukocytes,Ua MODERATE (*)   ? Bacteria, UA MANY (*)   ? All other components within normal limits  ?HCG, QUANTITATIVE, PREGNANCY - Abnormal; Notable for the following components:  ? hCG, Beta Chain, Quant, S 5 (*)   ? All other components within normal limits  ?URINE CULTURE  ?LACTIC ACID, PLASMA  ? ? ?EKG ?EKG Interpretation ? ?Date/Time:  Thursday Mar 23 2022 19:32:50 EDT ?Ventricular Rate:  117 ?  PR Interval:  150 ?QRS Duration: 76 ?QT Interval:  288 ?QTC Calculation: 401 ?R Axis:   62 ?Text Interpretation: Sinus tachycardia Septal infarct , age undetermined Abnormal ECG When compared with ECG of 17-Jan-2022 10:36, PREVIOUS ECG IS PRESENT Confirmed by Lennice Sites [phone]) on 03/23/2022 8:07:17 PM ? ?Radiology ?DG Chest 2 View ? ?Result Date: 03/23/2022 ?CLINICAL DATA:  Back abdominal and chest pain EXAM: CHEST - 2 VIEW COMPARISON:  01/17/2022 FINDINGS: The heart size and mediastinal contours are within normal limits. Both lungs are clear. The visualized skeletal structures are unremarkable. IMPRESSION: No active cardiopulmonary disease. Electronically Signed   By: Donavan Foil M.D.   On: 03/23/2022 20:26   ? ?Procedures ?Procedures  ? ? ?Medications Ordered in ED ?Medications  ?acetaminophen (TYLENOL) tablet 1,000 mg (1,000 mg Oral Given 03/23/22 2210)  ?morphine (PF) 4 MG/ML injection 4 mg (has no administration in time range)  ?lactated ringers bolus 1,000 mL (1,000 mLs Intravenous New Bag/Given 03/23/22 2126)  ?HYDROmorphone (DILAUDID) injection 1 mg (1 mg Intravenous Given 03/23/22 2127)  ?lactated ringers bolus 1,000 mL (1,000 mLs Intravenous New Bag/Given 03/23/22 2215)  ?ondansetron [facility]) injection 4 mg (4 mg Intravenous Given 03/23/22 2213)  ? ? ?ED Course/ Medical Decision Making/ A&P ?  ?                         ?Medical Decision Making ?Amount and/or Complexity of Data Reviewed ?Labs: ordered. ?Radiology: ordered. ? ?Risk ?OTC drugs. ?Prescription drug management. ? ? ?Social determinants of hea

## 2022-03-23 NOTE — ED Triage Notes (Signed)
Pt presents with back pain, abd pain, and chest pain for a few days. Fever started today with a TMax 104.5. Pt did have APAP and ibuprofen at 17:00. Pt with hx of gastroparesis.  ?

## 2022-03-23 NOTE — Plan of Care (Signed)
[facility] will assume care on arrival to accepting facility. Until arrival, care as per EDP. However, [facility] available 24/7 for questions and assistance.  Nursing staff, please page [facility] Admits and Consults ([phone]) as soon as the patient arrives to the hospital.   

## 2022-03-24 ENCOUNTER — Ambulatory Visit (HOSPITAL_BASED_OUTPATIENT_CLINIC_OR_DEPARTMENT_OTHER): Payer: Medicaid Other | Admitting: Physical Therapy

## 2022-03-24 ENCOUNTER — Other Ambulatory Visit: Payer: Self-pay | Admitting: Obstetrics and Gynecology

## 2022-03-24 ENCOUNTER — Telehealth: Payer: Self-pay

## 2022-03-24 DIAGNOSIS — D509 Iron deficiency anemia, unspecified: Secondary | ICD-10-CM | POA: Diagnosis not present

## 2022-03-24 DIAGNOSIS — N12 Tubulo-interstitial nephritis, not specified as acute or chronic: Secondary | ICD-10-CM | POA: Diagnosis not present

## 2022-03-24 DIAGNOSIS — F319 Bipolar disorder, unspecified: Secondary | ICD-10-CM | POA: Diagnosis not present

## 2022-03-24 DIAGNOSIS — Z91048 Other nonmedicinal substance allergy status: Secondary | ICD-10-CM | POA: Diagnosis not present

## 2022-03-24 DIAGNOSIS — N10 Acute tubulo-interstitial nephritis: Secondary | ICD-10-CM

## 2022-03-24 DIAGNOSIS — Z833 Family history of diabetes mellitus: Secondary | ICD-10-CM | POA: Diagnosis not present

## 2022-03-24 DIAGNOSIS — K219 Gastro-esophageal reflux disease without esophagitis: Secondary | ICD-10-CM | POA: Diagnosis not present

## 2022-03-24 DIAGNOSIS — A419 Sepsis, unspecified organism: Secondary | ICD-10-CM | POA: Diagnosis not present

## 2022-03-24 DIAGNOSIS — Z818 Family history of other mental and behavioral disorders: Secondary | ICD-10-CM | POA: Diagnosis not present

## 2022-03-24 DIAGNOSIS — Z8371 Family history of colonic polyps: Secondary | ICD-10-CM | POA: Diagnosis not present

## 2022-03-24 DIAGNOSIS — K580 Irritable bowel syndrome with diarrhea: Secondary | ICD-10-CM | POA: Diagnosis not present

## 2022-03-24 DIAGNOSIS — Z880 Allergy status to penicillin: Secondary | ICD-10-CM | POA: Diagnosis not present

## 2022-03-24 DIAGNOSIS — Z881 Allergy status to other antibiotic agents status: Secondary | ICD-10-CM | POA: Diagnosis not present

## 2022-03-24 DIAGNOSIS — Z90722 Acquired absence of ovaries, bilateral: Secondary | ICD-10-CM | POA: Diagnosis not present

## 2022-03-24 DIAGNOSIS — Z888 Allergy status to other drugs, medicaments and biological substances status: Secondary | ICD-10-CM | POA: Diagnosis not present

## 2022-03-24 DIAGNOSIS — Z681 Body mass index (BMI) 19 or less, adult: Secondary | ICD-10-CM | POA: Diagnosis not present

## 2022-03-24 DIAGNOSIS — G8929 Other chronic pain: Secondary | ICD-10-CM | POA: Diagnosis not present

## 2022-03-24 DIAGNOSIS — Z825 Family history of asthma and other chronic lower respiratory diseases: Secondary | ICD-10-CM | POA: Diagnosis not present

## 2022-03-24 DIAGNOSIS — E43 Unspecified severe protein-calorie malnutrition: Secondary | ICD-10-CM | POA: Diagnosis not present

## 2022-03-24 DIAGNOSIS — Z90710 Acquired absence of both cervix and uterus: Secondary | ICD-10-CM | POA: Diagnosis not present

## 2022-03-24 DIAGNOSIS — B9620 Unspecified Escherichia coli [E. coli] as the cause of diseases classified elsewhere: Secondary | ICD-10-CM | POA: Diagnosis not present

## 2022-03-24 DIAGNOSIS — R000 Tachycardia, unspecified: Secondary | ICD-10-CM | POA: Diagnosis not present

## 2022-03-24 DIAGNOSIS — Z8249 Family history of ischemic heart disease and other diseases of the circulatory system: Secondary | ICD-10-CM | POA: Diagnosis not present

## 2022-03-24 DIAGNOSIS — E876 Hypokalemia: Secondary | ICD-10-CM | POA: Diagnosis not present

## 2022-03-24 DIAGNOSIS — Z83438 Family history of other disorder of lipoprotein metabolism and other lipidemia: Secondary | ICD-10-CM | POA: Diagnosis not present

## 2022-03-24 DIAGNOSIS — R509 Fever, unspecified: Secondary | ICD-10-CM | POA: Diagnosis not present

## 2022-03-24 DIAGNOSIS — R64 Cachexia: Secondary | ICD-10-CM | POA: Diagnosis not present

## 2022-03-24 LAB — BASIC METABOLIC PANEL
Anion gap: 5 (ref 5–15)
BUN: 6 mg/dL (ref 6–20)
CO2: 27 mmol/L (ref 22–32)
Calcium: 8.3 mg/dL — ABNORMAL LOW (ref 8.9–10.3)
Chloride: 107 mmol/L (ref 98–111)
Creatinine, Ser: 0.66 mg/dL (ref 0.44–1.00)
GFR, Estimated: >60 mL/min (ref >60)
Glucose, Bld: 122 mg/dL — ABNORMAL HIGH (ref 70–99)
Potassium: 3 mmol/L — ABNORMAL LOW (ref 3.5–5.1)
Sodium: 139 mmol/L (ref 135–145)

## 2022-03-24 LAB — MAGNESIUM: Magnesium: 1.7 mg/dL (ref 1.7–2.4)

## 2022-03-24 LAB — CBC
HCT: 34.5 % — ABNORMAL LOW (ref 36.0–46.0)
Hemoglobin: 11.5 g/dL — ABNORMAL LOW (ref 12.0–15.0)
MCH: 31 pg (ref 26.0–34.0)
MCHC: 33.3 g/dL (ref 30.0–36.0)
MCV: 93 fL (ref 80.0–100.0)
Platelets: 181 10*3/uL (ref 150–400)
RBC: 3.71 MIL/uL — ABNORMAL LOW (ref 3.87–5.11)
RDW: 13.9 % (ref 11.5–15.5)
WBC: 8 10*3/uL (ref 4.0–10.5)
nRBC: 0 % (ref 0.0–0.2)

## 2022-03-24 MED ORDER — LORATADINE 10 MG PO TABS
10.0000 mg | ORAL_TABLET | Freq: Every day | ORAL | Status: DC
  Administered 2022-03-24 – 2022-03-27 (×4): 10 mg via ORAL
  Filled 2022-03-24 (×5): qty 1

## 2022-03-24 MED ORDER — ACETAMINOPHEN 650 MG RE SUPP: 650.0000 mg | Freq: Four times a day (QID) | RECTAL | Status: DC | PRN

## 2022-03-24 MED ORDER — SODIUM CHLORIDE 0.9 % IV SOLN
1.0000 g | Freq: Once | INTRAVENOUS | Status: AC
  Administered 2022-03-24: 1 g via INTRAVENOUS
  Filled 2022-03-24: qty 10

## 2022-03-24 MED ORDER — ESTROGENS CONJUGATED 0.625 MG PO TABS: 0.6250 mg | ORAL_TABLET | Freq: Every day | ORAL | 11 refills | Status: DC

## 2022-03-24 MED ORDER — ENSURE ENLIVE PO LIQD
237.0000 mL | Freq: Two times a day (BID) | ORAL | Status: DC
  Administered 2022-03-25 – 2022-03-26 (×4): 237 mL via ORAL

## 2022-03-24 MED ORDER — KETOROLAC TROMETHAMINE 15 MG/ML IJ SOLN
15.0000 mg | Freq: Once | INTRAMUSCULAR | Status: AC
  Administered 2022-03-24: 15 mg via INTRAVENOUS
  Filled 2022-03-24: qty 1

## 2022-03-24 MED ORDER — KETOROLAC TROMETHAMINE 15 MG/ML IJ SOLN
7.5000 mg | Freq: Once | INTRAMUSCULAR | Status: AC
  Administered 2022-03-24: 7.5 mg via INTRAVENOUS
  Filled 2022-03-24: qty 1

## 2022-03-24 MED ORDER — SODIUM CHLORIDE 0.9 % IV SOLN: INTRAVENOUS | Status: AC

## 2022-03-24 MED ORDER — ACETAMINOPHEN 325 MG PO TABS: 650.0000 mg | ORAL_TABLET | Freq: Four times a day (QID) | ORAL | Status: DC | PRN

## 2022-03-24 MED ORDER — KETOROLAC TROMETHAMINE 15 MG/ML IJ SOLN
15.0000 mg | Freq: Four times a day (QID) | INTRAMUSCULAR | Status: DC | PRN
  Administered 2022-03-24 – 2022-03-26 (×5): 15 mg via INTRAVENOUS
  Filled 2022-03-24 (×5): qty 1

## 2022-03-24 MED ORDER — SODIUM CHLORIDE 0.9 % IV SOLN
1.0000 g | INTRAVENOUS | Status: DC
  Administered 2022-03-25 – 2022-03-26 (×2): 1 g via INTRAVENOUS
  Filled 2022-03-24 (×2): qty 10

## 2022-03-24 MED ORDER — KETOROLAC TROMETHAMINE 30 MG/ML IJ SOLN
30.0000 mg | Freq: Once | INTRAMUSCULAR | Status: AC
  Administered 2022-03-24: 30 mg via INTRAVENOUS
  Filled 2022-03-24: qty 1

## 2022-03-24 MED ORDER — ADULT MULTIVITAMIN W/MINERALS CH
1.0000 | ORAL_TABLET | Freq: Every day | ORAL | Status: DC
  Administered 2022-03-24 – 2022-03-27 (×4): 1 via ORAL
  Filled 2022-03-24 (×4): qty 1

## 2022-03-24 MED ORDER — SODIUM CHLORIDE 0.9 % IV SOLN: Freq: Once | INTRAVENOUS | Status: AC

## 2022-03-24 MED ORDER — SENNOSIDES-DOCUSATE SODIUM 8.6-50 MG PO TABS
1.0000 | ORAL_TABLET | Freq: Every day | ORAL | Status: DC
  Administered 2022-03-24 – 2022-03-25 (×2): 1 via ORAL
  Administered 2022-03-26 – 2022-03-27 (×2): 2 via ORAL
  Filled 2022-03-24 (×2): qty 2
  Filled 2022-03-24 (×2): qty 1

## 2022-03-24 MED ORDER — ACETAMINOPHEN 325 MG PO TABS
650.0000 mg | ORAL_TABLET | Freq: Once | ORAL | Status: AC
  Administered 2022-03-24: 650 mg via ORAL
  Filled 2022-03-24: qty 2

## 2022-03-24 MED ORDER — ESTROGENS CONJUGATED 0.45 MG PO TABS
0.4500 mg | ORAL_TABLET | Freq: Every day | ORAL | Status: DC
  Administered 2022-03-24 – 2022-03-26 (×3): 0.45 mg via ORAL
  Filled 2022-03-24 (×5): qty 1

## 2022-03-24 MED ORDER — ASCORBIC ACID 500 MG PO TABS
1000.0000 mg | ORAL_TABLET | Freq: Two times a day (BID) | ORAL | Status: DC
  Administered 2022-03-24 – 2022-03-27 (×6): 1000 mg via ORAL
  Filled 2022-03-24 (×6): qty 2

## 2022-03-24 MED ORDER — ALBUTEROL SULFATE (2.5 MG/3ML) 0.083% IN NEBU: 2.5000 mg | INHALATION_SOLUTION | Freq: Four times a day (QID) | RESPIRATORY_TRACT | Status: DC | PRN

## 2022-03-24 MED ORDER — LINACLOTIDE 145 MCG PO CAPS
290.0000 ug | ORAL_CAPSULE | Freq: Every day | ORAL | Status: DC
  Administered 2022-03-24 – 2022-03-27 (×4): 290 ug via ORAL
  Filled 2022-03-24 (×4): qty 2

## 2022-03-24 MED ORDER — MORPHINE SULFATE (PF) 2 MG/ML IV SOLN
1.0000 mg | INTRAVENOUS | Status: DC | PRN
  Administered 2022-03-24 – 2022-03-25 (×2): 1 mg via INTRAVENOUS
  Filled 2022-03-24 (×4): qty 1

## 2022-03-24 MED ORDER — VITAMIN D 25 MCG (1000 UNIT) PO TABS
2000.0000 [IU] | ORAL_TABLET | Freq: Every day | ORAL | Status: DC
  Administered 2022-03-24 – 2022-03-27 (×4): 2000 [IU] via ORAL
  Filled 2022-03-24 (×4): qty 2

## 2022-03-24 MED ORDER — ONDANSETRON HCL 4 MG/2ML IJ SOLN
4.0000 mg | Freq: Four times a day (QID) | INTRAMUSCULAR | Status: DC | PRN
  Administered 2022-03-24 – 2022-03-25 (×3): 4 mg via INTRAVENOUS
  Filled 2022-03-24 (×6): qty 2

## 2022-03-24 MED ORDER — POTASSIUM CHLORIDE CRYS ER 20 MEQ PO TBCR
40.0000 meq | EXTENDED_RELEASE_TABLET | Freq: Once | ORAL | Status: AC
  Administered 2022-03-24: 40 meq via ORAL
  Filled 2022-03-24: qty 2

## 2022-03-24 MED ORDER — POLYETHYLENE GLYCOL 3350 17 G PO PACK
17.0000 g | PACK | Freq: Every day | ORAL | Status: DC
  Administered 2022-03-24 – 2022-03-27 (×4): 17 g via ORAL
  Filled 2022-03-24 (×5): qty 1

## 2022-03-24 MED ORDER — MELATONIN 5 MG PO TABS
10.0000 mg | ORAL_TABLET | Freq: Every day | ORAL | Status: DC
  Administered 2022-03-24 – 2022-03-26 (×3): 10 mg via ORAL
  Filled 2022-03-24 (×3): qty 2

## 2022-03-24 MED ORDER — OYSTER SHELL CALCIUM/D3 500-5 MG-MCG PO TABS
3.0000 | ORAL_TABLET | Freq: Every day | ORAL | Status: DC
  Administered 2022-03-24 – 2022-03-27 (×4): 3 via ORAL
  Filled 2022-03-24 (×4): qty 3

## 2022-03-24 MED ORDER — ONDANSETRON HCL 4 MG PO TABS: 4.0000 mg | ORAL_TABLET | Freq: Four times a day (QID) | ORAL | Status: DC | PRN

## 2022-03-24 NOTE — Assessment & Plan Note (Signed)
Iron studies done in march ?Received a dose of IV iron at her last hospitalization in march  ?hgb stable today  ?

## 2022-03-24 NOTE — Assessment & Plan Note (Signed)
She states pain is worse than her baseline ?CT abdomen/pelvis with left sided pyelo, but no other acute abdominal finding ?Non surgical exam, prn pain meds ?Continue to monitor  ?

## 2022-03-24 NOTE — Assessment & Plan Note (Addendum)
Ct shows diarrheal state; however, patient states she has more constipation  ?Continue current regimen by GI doctor of linzess, miralax and senokot ? ?

## 2022-03-24 NOTE — Assessment & Plan Note (Addendum)
Continue ensure plus BID ?Nutrition consulted on last admit with vitamin panel ordered ?Continue home vitamins, was deficient in B6 and vitamin A.  ?With her delayed gastric emptying gravitates  to liquid diet  ?

## 2022-03-24 NOTE — Assessment & Plan Note (Addendum)
36 year old with 2 day history of fever, left sided flank pain, nausea found to be septic secondary to acute pyelonephritis of left kidney, no obstructive uropathy.  ?-admit to med surg ?-UA appears infected, no urinary symptoms. Urine culture pending ?-continue rocephin  ?-blood culture pending ?-continue IVF ?-pain meds with toradol/prn morphine for severe pain  ?-recurrent pyelonephritis. Was admitted for same thing in march 2023. She did have imaging done with CT abdomen/pelvis showing no structural issues, but favors pyelonephritis.  ?With recurrent infection? Benefit from urology outpatient.  ?

## 2022-03-24 NOTE — Assessment & Plan Note (Signed)
S/p surgical menopause ?Continue premarin at her .'45mg'$  dosage. GYN just changed her dose, but they have not picked this up yet  ?

## 2022-03-24 NOTE — Telephone Encounter (Signed)
HRT increase to 0.625 per Elgie Congo MD due to increase in menopausal symptoms. MyChart message sent to patient due to current admission. Will follow up with patient on Monday. ?

## 2022-03-24 NOTE — Assessment & Plan Note (Signed)
Check magnesium ?Replete orally and trend in AM  ?

## 2022-03-24 NOTE — H&P (Signed)
?History and Physical  ? ? ?Patient: Rachel [name] FXT:[id number] DOB: 09-20-1986 ?DOA: 03/23/2022 ?DOS: the patient was seen and examined on 03/24/2022 ?PCP: Charlott Rakes, MD  ?Patient coming from:  [place]  - lives with mom, dad, grandma, sister, nephew.  ? ? ?Chief Complaint: fever/flank pain ? ?HPI: Rachel [name] is a 36 y.o. female with medical history significant of ADHD, bipolar, chronic abdominal pain with hx of delayed gastric emptying,  malnutrition, surgical menopause on estrogen who presented to ED with complaints of left sided flank pain. Thursday she had a temperature to 102.4>104.5.  She also had lethargy and left sided flank pain that started yesterday as well. She had complaints of right sided flank pain for a few days before this. Her mom decided to take her to ED as symptoms were similar to when she had pyelonephritis. She had some nausea last night and has persisted this morning. No emesis. Pain yesterday in the left flank was 10/10 and stabbing. No dysuria, frequency or urgency. No blood in her urine.  ? ?Admitted to [facility] for pyelonephritis march 2023. Placed on rocephin with urine culture that was inconclusive. D/cd on omnicef x 14 days.  ? ?+fever/chills, +blurry vision/headache , +chest pain, no shortness of breath or cough, +left flank pain, +Nausea, no vomiting or diarrhea, no dysuria or leg swelling.  ? ?ER Course:  vitals: temp: 101.6, bp: 114/85, HR 120, RR: 22, oxygen: 97%RA ?Pertinent labs: potassium: 3.3, UA: +nitrite, mod LE, many bacteria,  ?CXR: no acute finding ?CT renal stone: no renal stones/obstructive uropathy.  ?CT abdomen/pelvis: New abnormal hypoenhancing regions in the left kidney upper pole. ?Given the clinical context, the appearance favors acute left renal ?pyelonephritis ?In ED: started on rocephin, cultures ordered, IVF, toradol, morphine, and dilaudid for pain.  ? ? ?Review of Systems: As mentioned in the history of present illness. All other systems reviewed  and are negative. ?Past Medical History:  ?Diagnosis Date  ? ADHD (attention deficit hyperactivity disorder)   ? Anemia   ? Anxiety   ? Asthma   ? Bipolar disorder ([place])   ? Complication of anesthesia   ? slow to wake up   ? Delayed gastric emptying 02/23/2020  ? Depression   ? DUB (dysfunctional uterine bleeding) 02/23/2020  ? Ehlers-Danlos syndrome   ? Endometriosis   ? Gastroparesis   ? GERD (gastroesophageal reflux disease)   ? Heart murmur   ? Hiatal hernia 04/06/2020  ? Myalgia 04/06/2020  ? Parasitic infection 02/23/2020  ? Pneumonia   ? Poor appetite 02/23/2020  ? RUQ pain 02/23/2020  ? Vitamin D deficiency   ? Weight loss 02/23/2020  ? ?Past Surgical History:  ?Procedure Laterality Date  ? barium study    ? COLONOSCOPY    ? CYSTOSCOPY N/A 07/12/2021  ? Procedure: CYSTOSCOPY;  Surgeon: [name], MD;  Location: [place];  Service: Gynecology;  Laterality: N/A;  ? ESOPHAGOGASTRODUODENOSCOPY  2011  ? and a ph study as well.   ? LAPAROSCOPY N/A 01/26/2021  ? Procedure: LAPAROSCOPY DIAGNOSTIC;  Surgeon: [name], MD;  Location: [place];  Service: Gynecology;  Laterality: N/A;  ? NASAL SEPTUM SURGERY    ? TONGUE FLAP    ? TOTAL LAPAROSCOPIC HYSTERECTOMY WITH SALPINGECTOMY Bilateral 07/12/2021  ? Procedure: TOTAL LAPAROSCOPIC HYSTERECTOMY WITH SALPINGECTOMY, OOPHORECTOMY;  Surgeon: [name], MD;  Location: [place];  Service: Gynecology;  Laterality: Bilateral;  ? UPPER GASTROINTESTINAL ENDOSCOPY    ? WISDOM TOOTH EXTRACTION    ? ?  Social History:  reports that she has never smoked. She has never used smokeless tobacco. She reports current alcohol use of about 1.0 standard drink per week. She reports that she does not use drugs. ? ?Allergies  ?Allergen Reactions  ? Amphetamine-Dextroamphetamine Other (See Comments)  ?  Triggered bipolar, caused depression, and suicidal thoughts  ? Lupron [Leuprolide] Anaphylaxis  ? Freida Busman [Elagolix] Anaphylaxis  ? Penicillins Anaphylaxis,  Swelling and Other (See Comments)  ?  Potential for anaphylaxis confirmed. Tolerates Cefphalosporins ?No "-cillins"!!!! ?Did it involve swelling of the face/tongue/throat, SOB, or low BP? Yes ?Did it involve sudden or severe rash/hives, skin peeling, or any reaction on the inside of your mouth or nose? Unknown ?Did you need to seek medical attention at a hospital or doctor's office? Unknown ?When did it last happen? teenager      ?If all above answers are "NO", may proceed with cephalosporin use.  ? Chlorhexidine Hives  ?  DuraPrep   ? Doxycycline Itching, Nausea And Vomiting and Other (See Comments)  ?  Caused high fever, also  ? Olanzapine Other (See Comments)  ?  Caused mania  ? Other Other (See Comments)  ?  Stimulants, mood stabilizers, and antidepressants = Triggered bipolar, caused depression, and suicidal thoughts  ? Prozac [Fluoxetine Hcl] Other (See Comments)  ?  Triggered bipolar, caused depression, and suicidal thoughts  ? Tape Other (See Comments)  ?  Paper tape is tolerated  ? Triple Antibiotic Pain Relief [Neomy-Bacit-Polymyx-Pramoxine] Hives  ? Reglan [Metoclopramide] Anxiety and Other (See Comments)  ?  Triggered bipolar, caused depression, suicidal thoughts, muscle twitching, stiffness, and high anxiety   ? ? ?Family History  ?Problem Relation Age of Onset  ? Colon polyps Mother   ? Depression Mother   ? Anxiety disorder Mother   ? Hypertension Mother   ? Hyperlipidemia Mother   ? Asthma Mother   ? Endometriosis Mother   ? Fibroids Mother   ? Fibromyalgia Mother   ? Migraines Mother   ? Irritable bowel syndrome Mother   ? Depression Father   ? Hypertension Father   ? Hyperlipidemia Father   ? Bipolar disorder Father   ? Bipolar disorder Sister   ? ADD / ADHD Sister   ? Asthma Sister   ? Endometriosis Sister   ? Seizures Sister   ? Bipolar disorder Sister   ? Anxiety disorder Sister   ? Heart disease Maternal Grandmother   ? Endometriosis Maternal Grandmother   ? Fibroids Maternal Grandmother   ?  Hyperlipidemia Maternal Grandmother   ? Hypertension Maternal Grandmother   ? Diabetes Maternal Grandmother   ? Fibromyalgia Maternal Grandmother   ? Heart disease Maternal Grandfather   ? Diabetes Maternal Grandfather   ? Heart disease Paternal Grandmother   ? Heart disease Paternal Grandfather   ? Colon cancer Maternal Great-grandmother   ? Pancreatic cancer Neg Hx   ? Esophageal cancer Neg Hx   ? Stomach cancer Neg Hx   ? ? ?Prior to Admission medications   ?Medication Sig Start Date End Date Taking? Authorizing Provider  ?albuterol (PROVENTIL) (2.5 MG/3ML) 0.083% nebulizer solution Take 3 mLs (2.5 mg total) by nebulization every 6 (six) hours as needed for wheezing or shortness of breath. Max 4 doses per day 12/16/21   Kandra Nicolas, MD  ?albuterol (VENTOLIN HFA) 108 (90 Base) MCG/ACT inhaler Inhale 1-2 puffs into the lungs every 6 (six) hours as needed for wheezing or shortness of breath. 12/16/21   Theone Murdoch  A, MD  ?Ascorbic Acid (VITAMIN C) 1000 MG tablet Take 1,000 mg by mouth 2 (two) times daily.    [provider]  ?Calcium-Magnesium-Vitamin D 600-40-500 MG-MG-UNIT TB24 Take 3 tablets by mouth in the morning and at bedtime.    [provider]  ?Cholecalciferol (VITAMIN D3) 50 MCG (2000 UT) TABS Take 2,000 Units by mouth daily.    [provider]  ?ELDERBERRY PO Take 1 tablet by mouth daily.    [provider]  ?EPINEPHrine 0.3 mg/0.3 mL IJ SOAJ injection Inject 0.3 mg into the muscle as needed for anaphylaxis. 05/27/21   Levin Bacon, MD  ?estrogens, conjugated, (PREMARIN) 0.45 MG tablet Take 1 tablet (0.45 mg total) by mouth daily. 08/17/21   [name], MD  ?estrogens, conjugated, (PREMARIN) 0.625 MG tablet Take 1 tablet (0.625 mg total) by mouth daily. Take daily for 21 days then do not take for 7 days. 03/24/22 03/24/23  [name], MD  ?fexofenadine (ALLEGRA) 180 MG tablet Take 180 mg by mouth daily.    [provider]  ?ibuprofen  (ADVIL) 600 MG tablet Take 1 tablet (600 mg total) by mouth every 6 (six) hours as needed for headache, mild pain, moderate pain or cramping. 07/13/21   [name], MD  ?Rolan Lipa 290 MCG CAPS capsule TAKE ONE CA

## 2022-03-25 DIAGNOSIS — N10 Acute tubulo-interstitial nephritis: Secondary | ICD-10-CM | POA: Diagnosis not present

## 2022-03-25 LAB — CBC
HCT: 33.3 % — ABNORMAL LOW (ref 36.0–46.0)
Hemoglobin: 11.3 g/dL — ABNORMAL LOW (ref 12.0–15.0)
MCH: 31.5 pg (ref 26.0–34.0)
MCHC: 33.9 g/dL (ref 30.0–36.0)
MCV: 92.8 fL (ref 80.0–100.0)
Platelets: 176 10*3/uL (ref 150–400)
RBC: 3.59 MIL/uL — ABNORMAL LOW (ref 3.87–5.11)
RDW: 13.8 % (ref 11.5–15.5)
WBC: 8.6 10*3/uL (ref 4.0–10.5)
nRBC: 0 % (ref 0.0–0.2)

## 2022-03-25 LAB — BASIC METABOLIC PANEL
Anion gap: 4 — ABNORMAL LOW (ref 5–15)
BUN: 6 mg/dL (ref 6–20)
CO2: 24 mmol/L (ref 22–32)
Calcium: 8.5 mg/dL — ABNORMAL LOW (ref 8.9–10.3)
Chloride: 111 mmol/L (ref 98–111)
Creatinine, Ser: 0.6 mg/dL (ref 0.44–1.00)
GFR, Estimated: >60 mL/min (ref >60)
Glucose, Bld: 175 mg/dL — ABNORMAL HIGH (ref 70–99)
Potassium: 3.4 mmol/L — ABNORMAL LOW (ref 3.5–5.1)
Sodium: 139 mmol/L (ref 135–145)

## 2022-03-25 MED ORDER — SODIUM CHLORIDE 0.9 % IV SOLN
12.5000 mg | Freq: Four times a day (QID) | INTRAVENOUS | Status: DC | PRN
  Administered 2022-03-25 – 2022-03-26 (×4): 12.5 mg via INTRAVENOUS
  Filled 2022-03-25 (×3): qty 0.5
  Filled 2022-03-25: qty 12.5

## 2022-03-25 MED ORDER — POTASSIUM CHLORIDE CRYS ER 20 MEQ PO TBCR
40.0000 meq | EXTENDED_RELEASE_TABLET | Freq: Once | ORAL | Status: AC
  Administered 2022-03-25: 40 meq via ORAL
  Filled 2022-03-25: qty 2

## 2022-03-25 NOTE — Progress Notes (Signed)
?PROGRESS NOTE ? ? ? ?Rachel [name]  IEP:[id number] DOB: 09-Mar-1986 DOA: 03/23/2022 ?PCP: Charlott Rakes, MD ? ? ?Brief Narrative:  ?Rachel [name] is a 36 y.o. 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  No dysuria or hematuria.  Upon arrival to ED, she was febrile ?CT renal stone: no renal stones/obstructive uropathy.  ?CT abdomen/pelvis: New abnormal hypoenhancing regions in the left kidney upper pole. ?Given the clinical context, the appearance favors acute left renal ?pyelonephritis ?In ED: started on rocephin, cultures ordered, IVF, toradol, morphine, and dilaudid for pain.  ?Of note, Admitted to [facility] for pyelonephritis march 2023. Placed on rocephin with urine culture that was inconclusive. D/cd on omnicef x 14 days.  ?  ? ?Assessment & Plan: ?  ?Active Problems: ?  sepsis secondary to pyelonephritis of left kidney ?  Sepsis ([place]) ?  Hypokalemia ?  Iron deficiency anemia ?  Chronic abdominal pain ?  Protein-calorie malnutrition, severe ?  Vasomotor symptoms due to menopause ?  Irritable bowel syndrome (IBS) ? ?Sepsis secondary to left-sided pyelonephritis: Still with bilateral flank pain and some intermittent nausea.  Continue Rocephin and follow culture and tailor antibiotics accordingly. ? ?Hypokalemia: Low again, will replace. ? ?Nausea/history of gastroparesis: Per her, Zofran is not working much.  She is allergic to Reglan.  We will try as needed Phenergan. ? ?Chronic abdominal pain: Continue morphine as needed. ? ?Severe protein calorie malnutrition: Continue Ensure.  Nutrition consulted. ? ?Vasomotor symptoms due to menopause ?S/p surgical menopause ?Continue premarin at her .'45mg'$  dosage. GYN just changed her dose, but they have not picked this up yet  ?  ?Irritable bowel  syndrome (IBS) ?Ct shows diarrheal state; however, patient states she has more constipation  ?Continue current regimen by GI doctor of linzess, miralax and senokot ?  ? ?DVT prophylaxis: Place TED hose Start: 03/24/22 1828 ?  Code Status: Full Code  ?Family Communication: Mother present at bedside.  Plan of care discussed with patient in length and he/she verbalized understanding and agreed with it. ? ?Status is: Inpatient ?Remains inpatient appropriate because: Still very symptomatic. ? ? ?Estimated body mass index is 18.36 kg/m? as calculated from the following: ?  Height as of this encounter: 5' 2.5" (1.588 m). ?  Weight as of this encounter: 46.3 kg. ? ?  ?Nutritional Assessment: ?Body mass index is 18.36 kg/m?[name] KitchenMarland Kitchen ?Seen by dietician.  I agree with the assessment and plan as outlined below: ?Nutrition Status: ?  ?  ?  ? ?. ?Skin Assessment: ?I have examined the patient's skin and I agree with the wound assessment as performed by the wound care RN as outlined below: ?  ? ?Consultants:  ?None ? ?Procedures:  ?None ? ?Antimicrobials:  ?Anti-infectives (From admission, onward)  ? ? Start     Dose/Rate Route Frequency Ordered Stop  ? 03/25/22 1200  cefTRIAXone (ROCEPHIN) 1 g in sodium chloride 0.9 % 100 mL IVPB       ? 1 g ?200 mL/hr over 30 Minutes Intravenous Every 24 hours 03/24/22 1704    ? 03/24/22 1215  cefTRIAXone (ROCEPHIN) 1 g in sodium chloride 0.9 % 100 mL IVPB       ? 1 g ?200 mL/hr over 30 Minutes Intravenous  Once 03/24/22 1207 03/24/22 1258  ? 03/23/22 2300  cefTRIAXone (ROCEPHIN) 1  g in sodium chloride 0.9 % 100 mL IVPB       ? 1 g ?200 mL/hr over 30 Minutes Intravenous  Once 03/23/22 2255 03/23/22 2356  ? ?  ?  ? ? ?Subjective: ?Seen and examined.  Mother at the bedside.  Still complains of bilateral flank pain, abdominal pain as well as intermittent nausea.  No vomiting. ? ?Objective: ?Vitals:  ? 03/24/22 1646 03/24/22 2059 03/25/22 0545 03/25/22 0928  ?BP: 96/69 110/80 96/66 (!) 92/59  ?Pulse: 69 84  63 70  ?Resp: '20 18 18 16  '$ ?Temp: 98.6 ?F (37 ?C) (!) 100.4 ?F (38 ?C) 98.6 ?F (37 ?C) 98.4 ?F (36.9 ?C)  ?TempSrc: Oral  Oral   ?SpO2: 99% 100% 100% 97%  ?Weight:      ?Height:      ? ? ?Intake/Output Summary (Last 24 hours) at 03/25/2022 1115 ?Last data filed at 03/25/2022 0900 ?Gross per 24 hour  ?Intake 2874.32 ml  ?Output 0 ml  ?Net 2874.32 ml  ? ?Filed Weights  ? 03/23/22 1954  ?Weight: 46.3 kg  ? ? ?Examination: ? ?General exam: Appears calm and comfortable  ?Respiratory system: Clear to auscultation. Respiratory effort normal. ?Cardiovascular system: S1 & S2 heard, RRR. No JVD, murmurs, rubs, gallops or clicks. No pedal edema. ?Gastrointestinal system: Abdomen is nondistended, soft and generalized abdominal and bilateral flank tenderness. No organomegaly or masses felt. Normal bowel sounds heard. ?Central nervous system: Alert and oriented. No focal neurological deficits. ?Extremities: Symmetric 5 x 5 power. ?Skin: No rashes, lesions or ulcers ?Psychiatry: Judgement and insight appear normal. Mood & affect appropriate.  ? ? ?Data Reviewed: I have personally reviewed following labs and imaging studies ? ?CBC: ?Recent Labs  ?Lab 03/23/22 ?2014 03/24/22 ?1856 03/25/22 ?0228  ?WBC 10.0 8.0 8.6  ?NEUTROABS 8.6*  --   --   ?HGB 13.8 11.5* 11.3*  ?HCT 40.6 34.5* 33.3*  ?MCV 90.0 93.0 92.8  ?PLT 233 181 176  ? ?Basic Metabolic Panel: ?Recent Labs  ?Lab 03/23/22 ?2014 03/24/22 ?1856 03/25/22 ?0228  ?NA 138 139 139  ?K 3.3* 3.0* 3.4*  ?CL 104 107 111  ?CO2 '22 27 24  '$ ?GLUCOSE 119* 122* 175*  ?BUN '12 6 6  '$ ?CREATININE 0.76 0.66 0.60  ?CALCIUM 9.8 8.3* 8.5*  ?MG  --  1.7  --   ? ?GFR: ?Estimated Creatinine Clearance: 71.7 mL/min (by C-G formula based on SCr of 0.6 mg/dL). ?Liver Function Tests: ?Recent Labs  ?Lab 03/23/22 ?2014  ?AST 14*  ?ALT 8  ?ALKPHOS 62  ?BILITOT 0.3  ?PROT 7.5  ?ALBUMIN 4.5  ? ?No results for input(s): LIPASE, AMYLASE in the last 168 hours. ?No results for input(s): AMMONIA in the last 168  hours. ?Coagulation Profile: ?No results for input(s): INR, PROTIME in the last 168 hours. ?Cardiac Enzymes: ?No results for input(s): CKTOTAL, CKMB, CKMBINDEX, TROPONINI in the last 168 hours. ?BNP (last 3 results) ?No results for input(s): PROBNP in the last 8760 hours. ?HbA1C: ?No results for input(s): HGBA1C in the last 72 hours. ?CBG: ?No results for input(s): GLUCAP in the last 168 hours. ?Lipid Profile: ?No results for input(s): CHOL, HDL, LDLCALC, TRIG, CHOLHDL, LDLDIRECT in the last 72 hours. ?Thyroid Function Tests: ?No results for input(s): TSH, T4TOTAL, FREET4, T3FREE, THYROIDAB in the last 72 hours. ?Anemia Panel: ?No results for input(s): VITAMINB12, FOLATE, FERRITIN, TIBC, IRON, RETICCTPCT in the last 72 hours. ?Sepsis Labs: ?Recent Labs  ?Lab 03/23/22 ?2014  ?LATICACIDVEN 0.6  ? ? ?Recent Results (from  the past 240 hour(s))  ?Blood culture (routine x 2)     Status: None (Preliminary result)  ? Collection Time: 03/23/22 11:16 PM  ? Specimen: BLOOD RIGHT ARM  ?Result Value Ref Range Status  ? Specimen Description   Final  ?  BLOOD RIGHT ARM ?Performed at [facility], [address] [place], [place] [address] ?  ? Special Requests   Final  ?  BOTTLES DRAWN AEROBIC AND ANAEROBIC Blood Culture adequate volume ?Performed at [facility], [address] [place], [place] [address] ?  ? Culture   Final  ?  NO GROWTH < 24 HOURS ?Performed at [facility], [place] [address] [place], [place] [address] ?  ? Report Status PENDING  Incomplete  ?  ? ?Radiology Studies: ?DG Chest 2 View ? ?Result Date: 03/23/2022 ?CLINICAL DATA:  Back abdominal and chest pain EXAM: CHEST - 2 VIEW COMPARISON:  01/17/2022 FINDINGS: The heart size and mediastinal contours are within normal limits. Both lungs are clear. The visualized skeletal structures are unremarkable. IMPRESSION: No active cardiopulmonary disease. Electronically Signed   By: Donavan Foil M.D.   On: 03/23/2022 20:26   ? ?CT Renal Stone Study ? ?Result Date: 03/23/2022 ?CLINICAL DATA:  Flank pain, kidney stone suspected suspect pyelo Abdominal pain, back pain, chest pain.  Favor. EXAM: CT ABDOMEN AND PELVIS WITHOUT CONTRAST TECHNIQUE

## 2022-03-26 ENCOUNTER — Other Ambulatory Visit: Payer: Self-pay | Admitting: Gastroenterology

## 2022-03-26 DIAGNOSIS — N10 Acute tubulo-interstitial nephritis: Secondary | ICD-10-CM | POA: Diagnosis not present

## 2022-03-26 LAB — BASIC METABOLIC PANEL
Anion gap: 6 (ref 5–15)
BUN: <5 mg/dL — ABNORMAL LOW (ref 6–20)
CO2: 24 mmol/L (ref 22–32)
Calcium: 8.5 mg/dL — ABNORMAL LOW (ref 8.9–10.3)
Chloride: 110 mmol/L (ref 98–111)
Creatinine, Ser: 0.49 mg/dL (ref 0.44–1.00)
GFR, Estimated: >60 mL/min (ref >60)
Glucose, Bld: 102 mg/dL — ABNORMAL HIGH (ref 70–99)
Potassium: 3.9 mmol/L (ref 3.5–5.1)
Sodium: 140 mmol/L (ref 135–145)

## 2022-03-26 LAB — CBC WITH DIFFERENTIAL/PLATELET
Abs Immature Granulocytes: 0.02 10*3/uL (ref 0.00–0.07)
Basophils Absolute: 0 10*3/uL (ref 0.0–0.1)
Basophils Relative: 0 %
Eosinophils Absolute: 0.3 10*3/uL (ref 0.0–0.5)
Eosinophils Relative: 4 %
HCT: 31.1 % — ABNORMAL LOW (ref 36.0–46.0)
Hemoglobin: 10.6 g/dL — ABNORMAL LOW (ref 12.0–15.0)
Immature Granulocytes: 0 %
Lymphocytes Relative: 29 %
Lymphs Abs: 2.1 10*3/uL (ref 0.7–4.0)
MCH: 31.7 pg (ref 26.0–34.0)
MCHC: 34.1 g/dL (ref 30.0–36.0)
MCV: 93.1 fL (ref 80.0–100.0)
Monocytes Absolute: 0.9 10*3/uL (ref 0.1–1.0)
Monocytes Relative: 12 %
Neutro Abs: 3.9 10*3/uL (ref 1.7–7.7)
Neutrophils Relative %: 55 %
Platelets: 172 10*3/uL (ref 150–400)
RBC: 3.34 MIL/uL — ABNORMAL LOW (ref 3.87–5.11)
RDW: 14.1 % (ref 11.5–15.5)
WBC: 7.3 10*3/uL (ref 4.0–10.5)
nRBC: 0 % (ref 0.0–0.2)

## 2022-03-26 LAB — URINE CULTURE: Culture: >=100000 — AB

## 2022-03-26 LAB — MAGNESIUM: Magnesium: 1.7 mg/dL (ref 1.7–2.4)

## 2022-03-26 MED ORDER — PROMETHAZINE HCL 25 MG PO TABS
12.5000 mg | ORAL_TABLET | Freq: Four times a day (QID) | ORAL | Status: DC | PRN
  Administered 2022-03-26: 12.5 mg via ORAL
  Filled 2022-03-26: qty 1

## 2022-03-26 MED ORDER — IBUPROFEN 600 MG PO TABS
600.0000 mg | ORAL_TABLET | Freq: Four times a day (QID) | ORAL | Status: DC | PRN
  Administered 2022-03-26: 600 mg via ORAL
  Filled 2022-03-26: qty 1

## 2022-03-26 MED ORDER — ENOXAPARIN SODIUM 30 MG/0.3ML IJ SOSY
30.0000 mg | PREFILLED_SYRINGE | INTRAMUSCULAR | Status: DC
  Administered 2022-03-26: 30 mg via SUBCUTANEOUS
  Filled 2022-03-26: qty 0.3

## 2022-03-26 MED ORDER — SENNOSIDES-DOCUSATE SODIUM 8.6-50 MG PO TABS
1.0000 | ORAL_TABLET | Freq: Once | ORAL | Status: AC
  Administered 2022-03-26: 1 via ORAL
  Filled 2022-03-26: qty 1

## 2022-03-26 MED ORDER — PROCHLORPERAZINE EDISYLATE 10 MG/2ML IJ SOLN: 10.0000 mg | Freq: Four times a day (QID) | INTRAMUSCULAR | Status: DC | PRN

## 2022-03-26 NOTE — Evaluation (Signed)
Physical Therapy Evaluation ?Patient Details ?Name: Rachel [name] ?MRN: [id number] ?DOB: 09/29/1986 ?Today's Date: 03/26/2022 ? ?History of Present Illness ? 36 y/o female presented to ED on 03/23/22 for L sided flank pain and fever >104.5 with nausea. CT abdomen showed new abnormal hypoenhancing regions in L kidney upper pole. Admitted for possible acute L renal pyelonephritis. Recently admitted at [facility] 01/2022 for pyelonephritis. PMH: ADHD, bipolar, Ehlers Danlos  ?Clinical Impression ? Patient admitted with the above. Patient currently presents with decreased activity tolerance and weakness. Patient able to come to EOB with minA, however with increased nausea and abdominal pain. Able to tolerate sitting EOB x 8 minutes prior to returning to supine. Educated patient on importance of mobility and effects of immobility, patient verbalized understanding. Encouraged patient to increase HOB to build upright tolerance and perform LAQ, seated marching, SLR, hip abduction/adduction, and hip flexion while in bed to build strength in BLE. PTA, patient lives with her family and was independent and doing housework. Patient will benefit from skilled PT services during acute stay to address listed deficits. Patient had OP aquatic therapy set up prior to hospitalization and encouraged patient to continue at discharge.    ?   ? ?Recommendations for follow up therapy are one component of a multi-disciplinary discharge planning process, led by the attending physician.  Recommendations may be updated based on patient status, additional functional criteria and insurance authorization. ? ?Follow Up Recommendations Outpatient PT (Already has aquatic therapy set up from before admission) ? ?  ?Assistance Recommended at Discharge Frequent or constant Supervision/Assistance  ?Patient can return home with the following ? A lot of help with walking and/or transfers;A little help with bathing/dressing/bathroom;Assistance with  cooking/housework;Help with stairs or ramp for entrance ? ?  ?Equipment Recommendations Other (comment) (TBD)  ?Recommendations for Other Services ?    ?  ?Functional Status Assessment Patient has had a recent decline in their functional status and demonstrates the ability to make significant improvements in function in a reasonable and predictable amount of time.  ? ?  ?Precautions / Restrictions Precautions ?Precautions: Fall ?Restrictions ?Weight Bearing Restrictions: No  ? ?  ? ?Mobility ? Bed Mobility ?Overal bed mobility: Needs Assistance ?Bed Mobility: Rolling, Sidelying to Sit, Sit to Sidelying ?Rolling: Min guard ?Sidelying to sit: Min assist ?  ?  ?Sit to sidelying: Min assist ?General bed mobility comments: minA for trunk elevation and assist for LE management back into bed ?  ? ?Transfers ?  ?  ?  ?  ?  ?  ?  ?  ?  ?General transfer comment: deferred due to increased nausea and pain upon sitting EOB ?  ? ?Ambulation/Gait ?  ?  ?  ?  ?  ?  ?  ?  ? ?Stairs ?  ?  ?  ?  ?  ? ?Wheelchair Mobility ?  ? ?Modified Rankin (Stroke Patients Only) ?  ? ?  ? ?Balance Overall balance assessment: Needs assistance ?Sitting-balance support: No upper extremity supported, Feet unsupported ?Sitting balance-Leahy Scale: Good ?  ?  ?  ?  ?  ?  ?  ?  ?  ?  ?  ?  ?  ?  ?  ?  ?   ? ? ? ?Pertinent Vitals/Pain Pain Assessment ?Pain Assessment: Faces ?Faces Pain Scale: Hurts whole lot ?Pain Location: abdomen ?Pain Descriptors / Indicators: Discomfort, Grimacing ?Pain Intervention(s): Monitored during session, Repositioned, Limited activity within patient's tolerance  ? ? ?Home Living Family/patient expects to be  discharged to:: Private residence ?Living Arrangements: Parent;Other relatives ?Available Help at Discharge: Family ?Type of Home: House ?Home Access: Stairs to enter ?Entrance Stairs-Rails: None ?Entrance Stairs-Number of Steps: 6 ?Alternate Level Stairs-Number of Steps: flight ?Home Layout: Two level;Bed/bath  upstairs ?Home Equipment: None ?   ?  ?Prior Function Prior Level of Function : Independent/Modified Independent ?  ?  ?  ?  ?  ?  ?Mobility Comments: patient does not work or drive. Reports independence otherwise ?  ?  ? ? ?Hand Dominance  ?   ? ?  ?Extremity/Trunk Assessment  ? Upper Extremity Assessment ?Upper Extremity Assessment: Defer to OT evaluation ?  ? ?Lower Extremity Assessment ?Lower Extremity Assessment: Generalized weakness ?  ? ?Cervical / Trunk Assessment ?Cervical / Trunk Assessment: Normal  ?Communication  ? Communication: No difficulties  ?Cognition Arousal/Alertness: Awake/alert ?Behavior During Therapy: [facility] for tasks assessed/performed ?Overall Cognitive Status: Within Functional Limits for tasks assessed ?  ?  ?  ?  ?  ?  ?  ?  ?  ?  ?  ?  ?  ?  ?  ?  ?General Comments: appears at baseline. Seems self limiting at times ?  ?  ? ?  ?General Comments   ? ?  ?Exercises    ? ?Assessment/Plan  ?  ?PT Assessment Patient needs continued PT services  ?PT Problem List Decreased strength;Decreased activity tolerance;Decreased balance;Decreased mobility ? ?   ?  ?PT Treatment Interventions DME instruction;Gait training;Functional mobility training;Therapeutic activities;Therapeutic exercise;Balance training;Stair training;Patient/family education   ? ?PT Goals (Current goals can be found in the Care Plan section)  ?Acute Rehab PT Goals ?Patient Stated Goal: to reduce pain and be normal ?PT Goal Formulation: With patient ?Time For Goal Achievement: 04/09/22 ?Potential to Achieve Goals: Good ? ?  ?Frequency Min 3X/week ?  ? ? ?Co-evaluation   ?  ?  ?  ?  ? ? ?  ?AM-PAC PT "6 Clicks" Mobility  ?Outcome Measure Help needed turning from your back to your side while in a flat bed without using bedrails?: A Little ?Help needed moving from lying on your back to sitting on the side of a flat bed without using bedrails?: A Little ?Help needed moving to and from a bed to a chair (including a wheelchair)?: A  Little ?Help needed standing up from a chair using your arms (e.g., wheelchair or bedside chair)?: A Lot ?Help needed to walk in hospital room?: A Lot ?Help needed climbing 3-5 steps with a railing? : A Lot ?6 Click Score: 15 ? ?  ?End of Session   ?Activity Tolerance: Treatment limited secondary to medical complications (Comment);Patient limited by pain (nausea) ?Patient left: in bed;with call bell/phone within reach;with family/visitor present ?Nurse Communication: Mobility status ?PT Visit Diagnosis: Unsteadiness on feet (R26.81);Muscle weakness (generalized) (M62.81);Difficulty in walking, not elsewhere classified (R26.2) ?  ? ?Time: 3614-4315 ?PT Time Calculation (min) (ACUTE ONLY): 32 min ? ? ?Charges:   PT Evaluation ?$PT Eval Moderate Complexity: 1 Mod ?PT Treatments ?$Therapeutic Activity: 8-22 mins ?  ?   ? ? ?Sampson Self A. Gilford Rile, PT, DPT ?Acute Rehabilitation Services ?Pager [phone] ?Office [phone] ? ? ?Drinda Belgard A Lindberg Zenon ?03/26/2022, 12:32 PM ? ?

## 2022-03-26 NOTE — Progress Notes (Signed)
?PROGRESS NOTE ? ? ? ?Rachel [name]  XFG:[id number] DOB: 1986-03-14 DOA: 03/23/2022 ?PCP: Charlott Rakes, MD ? ? ?Brief Narrative:  ?Rachel [name] is a 36 y.o. 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  No dysuria or hematuria.  Upon arrival to ED, she was febrile ?CT renal stone: no renal stones/obstructive uropathy.  ?CT abdomen/pelvis: New abnormal hypoenhancing regions in the left kidney upper pole. ?Given the clinical context, the appearance favors acute left renal ?pyelonephritis ?In ED: started on rocephin, cultures ordered, IVF, toradol, morphine, and dilaudid for pain.  ?Of note, Admitted to [facility] for pyelonephritis march 2023. Placed on rocephin with urine culture that was inconclusive. D/cd on omnicef x 14 days.  ?  ? ?Assessment & Plan: ?  ?Active Problems: ?  sepsis secondary to pyelonephritis of left kidney ?  Sepsis ([place]) ?  Hypokalemia ?  Iron deficiency anemia ?  Chronic abdominal pain ?  Protein-calorie malnutrition, severe ?  Vasomotor symptoms due to menopause ?  Irritable bowel syndrome (IBS) ? ?Sepsis secondary to left-sided pyelonephritis: Still with bilateral flank pain and some intermittent nausea.  However she is now afebrile for more than 24 hours.  Urine culture is growing E. coli, sensitivities pending.  Continue Rocephin.  Blood culture negative.   ? ?Hypokalemia: Resolved. ? ?Nausea/history of gastroparesis: Still symptomatic.  Remain on Zofran and Phenergan as needed.  Allergic to Reglan.  We will try Compazine. ? ?Chronic abdominal pain: Continue morphine as needed. ? ?Severe protein calorie malnutrition: Continue Ensure.  Nutrition consulted. ? ?Vasomotor symptoms due to menopause ?S/p surgical menopause ?Continue premarin at her .'45mg'$  dosage. GYN just  changed her dose, but they have not picked this up yet  ?  ?Irritable bowel syndrome (IBS) ?Ct shows diarrheal state; however, patient states she has more constipation  ?Continue current regimen by GI doctor of linzess, miralax and senokot ?  ? ?DVT prophylaxis: enoxaparin (LOVENOX) injection 30 mg Start: 03/26/22 1115 ?Place TED hose Start: 03/24/22 1828 ?  Code Status: Full Code  ?Family Communication: Mother present at bedside.  Plan of care discussed with patient in length and he/she verbalized understanding and agreed with it. ? ?Status is: Inpatient ?Remains inpatient appropriate because: Still very symptomatic. ? ? ?Estimated body mass index is 18.36 kg/m? as calculated from the following: ?  Height as of this encounter: 5' 2.5" (1.588 m). ?  Weight as of this encounter: 46.3 kg. ? ?  ?Nutritional Assessment: ?Body mass index is 18.36 kg/m?[name] KitchenMarland Kitchen ?Seen by dietician.  I agree with the assessment and plan as outlined below: ?Nutrition Status: ?  ?  ?  ? ?. ?Skin Assessment: ?I have examined the patient's skin and I agree with the wound assessment as performed by the wound care RN as outlined below: ?  ? ?Consultants:  ?None ? ?Procedures:  ?None ? ?Antimicrobials:  ?Anti-infectives (From admission, onward)  ? ? Start     Dose/Rate Route Frequency Ordered Stop  ? 03/25/22 1200  cefTRIAXone (ROCEPHIN) 1 g in sodium chloride 0.9 % 100 mL IVPB       ? 1 g ?200 mL/hr over 30 Minutes Intravenous Every 24 hours 03/24/22 1704    ? 03/24/22 1215  cefTRIAXone (ROCEPHIN) 1 g in sodium chloride 0.9 % 100 mL IVPB       ?  1 g ?200 mL/hr over 30 Minutes Intravenous  Once 03/24/22 1207 03/24/22 1258  ? 03/23/22 2300  cefTRIAXone (ROCEPHIN) 1 g in sodium chloride 0.9 % 100 mL IVPB       ? 1 g ?200 mL/hr over 30 Minutes Intravenous  Once 03/23/22 2255 03/23/22 2356  ? ?  ?  ? ? ?Subjective: ? ?Seen and examined.  Her boyfriend is at the bedside.  Patient still complains of abdominal pain, bilateral flank pain and nausea.  She is  mostly laying in the bed.  Complaining of back pain as well.  She was strongly advised to get up and walk to prevent muscle spasm or muscular pain. ? ?Objective: ?Vitals:  ? 03/25/22 1820 03/25/22 2058 03/26/22 0421 03/26/22 1015  ?BP: 1'06/66 98/67 95/62 '$ 102/67  ?Pulse: 64 (!) 59 60 (!) 59  ?Resp: '15 14 14 16  '$ ?Temp: 99.4 ?F (37.4 ?C) 98.3 ?F (36.8 ?C) 98.2 ?F (36.8 ?C) 98.8 ?F (37.1 ?C)  ?TempSrc:   Oral Oral  ?SpO2: 98% 96% 99% 97%  ?Weight:      ?Height:      ? ? ?Intake/Output Summary (Last 24 hours) at 03/26/2022 1028 ?Last data filed at 03/25/2022 1811 ?Gross per 24 hour  ?Intake 360.67 ml  ?Output --  ?Net 360.67 ml  ? ?Filed Weights  ? 03/23/22 1954  ?Weight: 46.3 kg  ? ? ?Examination: ? ?General exam: Appears calm and comfortable  ?Respiratory system: Clear to auscultation. Respiratory effort normal. ?Cardiovascular system: S1 & S2 heard, RRR. No JVD, murmurs, rubs, gallops or clicks. No pedal edema. ?Gastrointestinal system: Abdomen is nondistended, soft and generalized moderate abdominal tenderness and bilateral CVA tenderness, unsure if CVA tenderness is musculoskeletal. No organomegaly or masses felt. Normal bowel sounds heard. ?Central nervous system: Alert and oriented. No focal neurological deficits. ?Extremities: Symmetric 5 x 5 power. ?Skin: No rashes, lesions or ulcers.  ?Psychiatry: Judgement and insight appear normal. Mood & affect flat. ? ?Data Reviewed: I have personally reviewed following labs and imaging studies ? ?CBC: ?Recent Labs  ?Lab 03/23/22 ?2014 03/24/22 ?1856 03/25/22 ?0228 03/26/22 ?0259  ?WBC 10.0 8.0 8.6 7.3  ?NEUTROABS 8.6*  --   --  3.9  ?HGB 13.8 11.5* 11.3* 10.6*  ?HCT 40.6 34.5* 33.3* 31.1*  ?MCV 90.0 93.0 92.8 93.1  ?PLT 233 181 176 172  ? ?Basic Metabolic Panel: ?Recent Labs  ?Lab 03/23/22 ?2014 03/24/22 ?1856 03/25/22 ?0228 03/26/22 ?0259  ?NA 138 139 139 140  ?K 3.3* 3.0* 3.4* 3.9  ?CL 104 107 111 110  ?CO2 '22 27 24 24  '$ ?GLUCOSE 119* 122* 175* 102*  ?BUN '12 6 6 '$ <5*   ?CREATININE 0.76 0.66 0.60 0.49  ?CALCIUM 9.8 8.3* 8.5* 8.5*  ?MG  --  1.7  --  1.7  ? ?GFR: ?Estimated Creatinine Clearance: 71.7 mL/min (by C-G formula based on SCr of 0.49 mg/dL). ?Liver Function Tests: ?Recent Labs  ?Lab 03/23/22 ?2014  ?AST 14*  ?ALT 8  ?ALKPHOS 62  ?BILITOT 0.3  ?PROT 7.5  ?ALBUMIN 4.5  ? ?No results for input(s): LIPASE, AMYLASE in the last 168 hours. ?No results for input(s): AMMONIA in the last 168 hours. ?Coagulation Profile: ?No results for input(s): INR, PROTIME in the last 168 hours. ?Cardiac Enzymes: ?No results for input(s): CKTOTAL, CKMB, CKMBINDEX, TROPONINI in the last 168 hours. ?BNP (last 3 results) ?No results for input(s): PROBNP in the last 8760 hours. ?HbA1C: ?No results for input(s): HGBA1C in the last 72 hours. ?CBG: ?No  results for input(s): GLUCAP in the last 168 hours. ?Lipid Profile: ?No results for input(s): CHOL, HDL, LDLCALC, TRIG, CHOLHDL, LDLDIRECT in the last 72 hours. ?Thyroid Function Tests: ?No results for input(s): TSH, T4TOTAL, FREET4, T3FREE, THYROIDAB in the last 72 hours. ?Anemia Panel: ?No results for input(s): VITAMINB12, FOLATE, FERRITIN, TIBC, IRON, RETICCTPCT in the last 72 hours. ?Sepsis Labs: ?Recent Labs  ?Lab 03/23/22 ?2014  ?LATICACIDVEN 0.6  ? ? ?Recent Results (from the past 240 hour(s))  ?Urine Culture     Status: Abnormal  ? Collection Time: 03/23/22 10:03 PM  ? Specimen: In/Out Cath Urine  ?Result Value Ref Range Status  ? Specimen Description   Final  ?  IN/OUT CATH URINE ?Performed at [facility], [address], [place], [place] [address] ?  ? Special Requests   Final  ?  NONE ?Performed at [facility], [address], [place], [place] [address] ?  ? Culture >=100,000 COLONIES/mL ESCHERICHIA COLI (A)  Final  ? Report Status 03/26/2022 FINAL  Final  ? Organism ID, Bacteria ESCHERICHIA COLI (A)  Final  ?    Susceptibility  ? Escherichia coli - MIC*  ?  AMPICILLIN <=2 SENSITIVE Sensitive   ?   CEFAZOLIN <=4 SENSITIVE Sensitive   ?  CEFEPIME <=0.12 SENSITIVE Sensitive   ?  CEFTRIAXONE <=0.25 SENSITIVE Sensitive   ?  CIPROFLOXACIN <=0.25 SENSITIVE Sensitive   ?  GENTAMICIN <=1 SENSITIVE Sensitive   ?  IMIPENE

## 2022-03-26 NOTE — Plan of Care (Signed)
  Problem: Clinical Measurements: Goal: Ability to maintain clinical measurements within normal limits will improve Outcome: Progressing   

## 2022-03-26 NOTE — Evaluation (Signed)
Occupational Therapy Evaluation ?Patient Details ?Name: Rachel [name] ?MRN: [id number] ?DOB: 1986/06/12 ?Today's Date: 03/26/2022 ? ? ?History of Present Illness 36 y/o female presented to ED on 03/23/22 for L sided flank pain and fever >104.5 with nausea. CT abdomen showed new abnormal hypoenhancing regions in L kidney upper pole. Admitted for possible acute L renal pyelonephritis. Recently admitted at [facility] 01/2022 for pyelonephritis. PMH: ADHD, bipolar, Ehlers Danlos  ? ?Clinical Impression ?  ?Pt admitted for concerns listed above. Pta pt reported that she is typically independent with all ADL's and IADL's. She is limited by activity tolerance and increased weakness, as well as chronic pain. Pt requiring min guard to min A for ADL's and functional mobility at this time. OT provided education on prioritizing tasks each day to conserve energy, asell as track her tasks to determine which tasks are most fatiguing. Pt motivated to return to her baseline. Recommending OP OT at a neuro clinic if possible to address LUE deficits, as well as further address energy conservation tips due to her chronic illnesses. OT will continue to follow acutely.    ?   ? ?Recommendations for follow up therapy are one component of a multi-disciplinary discharge planning process, led by the attending physician.  Recommendations may be updated based on patient status, additional functional criteria and insurance authorization.  ? ?Follow Up Recommendations ? Outpatient OT (neuro based)  ?  ?Assistance Recommended at Discharge Set up Supervision/Assistance  ?Patient can return home with the following A little help with walking and/or transfers;A little help with bathing/dressing/bathroom;Assistance with cooking/housework;Help with stairs or ramp for entrance ? ?  ?Functional Status Assessment ? Patient has had a recent decline in their functional status and demonstrates the ability to make significant improvements in function in a reasonable  and predictable amount of time.  ?Equipment Recommendations ? None recommended by OT  ?  ?Recommendations for Other Services   ? ? ?  ?Precautions / Restrictions Precautions ?Precautions: Fall ?Restrictions ?Weight Bearing Restrictions: No  ? ?  ? ?Mobility Bed Mobility ?  ?  ?  ?  ?  ?  ?  ?General bed mobility comments: in recliner on entry ?  ? ?Transfers ?  ?  ?  ?  ?  ?  ?  ?  ?  ?General transfer comment: deferred due to pt fatigue ?  ? ?  ?Balance Overall balance assessment: Needs assistance ?Sitting-balance support: No upper extremity supported, Feet unsupported ?Sitting balance-Leahy Scale: Good ?  ?  ?  ?  ?  ?  ?  ?  ?  ?  ?  ?  ?  ?  ?  ?  ?   ? ?ADL either performed or assessed with clinical judgement  ? ?ADL Overall ADL's : Needs assistance/impaired ?  ?  ?  ?  ?  ?  ?  ?  ?  ?  ?  ?  ?  ?  ?  ?  ?  ?  ?  ?General ADL Comments: Min guard for all ADL's, decreased activity tolerance, requires frequent breaks, unable to do tasks back to back due to fatgue  ? ? ? ?Vision Baseline Vision/History: 0 No visual deficits ?Ability to See in Adequate Light: 0 Adequate ?Patient Visual Report: No change from baseline ?Vision Assessment?: No apparent visual deficits  ?   ?Perception   ?  ?Praxis   ?  ? ?Pertinent Vitals/Pain Pain Assessment ?Pain Assessment: 0-10 ?Pain Score: 5  ?Pain Location: back  and abdomen ?Pain Descriptors / Indicators: Discomfort, Grimacing ?Pain Intervention(s): Monitored during session, Repositioned  ? ? ? ?Hand Dominance Right ?  ?Extremity/Trunk Assessment Upper Extremity Assessment ?Upper Extremity Assessment: Generalized weakness;LUE deficits/detail ?LUE Deficits / Details: Pt has numbness and tingling down her arm, pain starting on mid back and going up shoulder and down arm, weak, unable to grip items such as cup or phone without assist from [place] ?LUE Sensation: decreased light touch;decreased proprioception ?LUE Coordination: decreased fine motor;decreased gross motor ?  ?Lower  Extremity Assessment ?Lower Extremity Assessment: Defer to PT evaluation ?  ?Cervical / Trunk Assessment ?Cervical / Trunk Assessment: Normal ?  ?Communication Communication ?Communication: No difficulties ?  ?Cognition Arousal/Alertness: Awake/alert ?Behavior During Therapy: [address] for tasks assessed/performed ?Overall Cognitive Status: Within Functional Limits for tasks assessed ?  ?  ?  ?  ?  ?  ?  ?  ?  ?  ?  ?  ?  ?  ?  ?  ?General Comments: appears at baseline. Seems self limiting at times ?  ?  ?General Comments  VSS on RA ? ?  ?Exercises   ?  ?Shoulder Instructions    ? ? ?Home Living Family/patient expects to be discharged to:: Private residence ?Living Arrangements: Parent;Other relatives ?Available Help at Discharge: Family ?Type of Home: House ?Home Access: Stairs to enter ?Entrance Stairs-Number of Steps: 6 ?Entrance Stairs-Rails: None ?Home Layout: Two level;Bed/bath upstairs ?Alternate Level Stairs-Number of Steps: flight ?  ?Bathroom Shower/Tub: Gaffer;Tub/shower unit ?  ?Bathroom Toilet: Standard ?  ?  ?Home Equipment: Shower seat ?  ?  ?  ? ?  ?Prior Functioning/Environment Prior Level of Function : Independent/Modified Independent ?  ?  ?  ?  ?  ?  ?Mobility Comments: patient does not work or drive. Reports independence otherwise ?ADLs Comments: indep ?  ? ?  ?  ?OT Problem List: Decreased strength;Decreased range of motion;Decreased activity tolerance;Impaired sensation;Impaired UE functional use;Pain ?  ?   ?OT Treatment/Interventions: Self-care/ADL training;Therapeutic exercise;Energy conservation;Neuromuscular education;DME and/or AE instruction;Therapeutic activities;Patient/family education;Balance training  ?  ?OT Goals(Current goals can be found in the care plan section) Acute Rehab OT Goals ?Patient Stated Goal: To get back to her baseline ?OT Goal Formulation: With patient ?Time For Goal Achievement: 04/09/22 ?Potential to Achieve Goals: Good ?ADL Goals ?Pt/caregiver will Perform  Home Exercise Program: Both right and left upper extremity;Increased strength;With theraband;With theraputty;Independently;With written HEP provided ?Additional ADL Goal #1: Pt will verbalize 3 energy conservation techniques to maximize independence and safety at home.  ?OT Frequency: Min 2X/week ?  ? ?Co-evaluation   ?  ?  ?  ?  ? ?  ?AM-PAC OT "6 Clicks" Daily Activity     ?Outcome Measure Help from another person eating meals?: None ?Help from another person taking care of personal grooming?: A Little ?Help from another person toileting, which includes using toliet, bedpan, or urinal?: A Little ?Help from another person bathing (including washing, rinsing, drying)?: A Little ?Help from another person to put on and taking off regular upper body clothing?: A Little ?Help from another person to put on and taking off regular lower body clothing?: A Little ?6 Click Score: 19 ?  ?End of Session Nurse Communication: Mobility status ? ?Activity Tolerance: Patient limited by fatigue ?Patient left: in chair;with call bell/phone within reach;with family/visitor present ? ?OT Visit Diagnosis: Unsteadiness on feet (R26.81);Other abnormalities of gait and mobility (R26.89);Muscle weakness (generalized) (M62.81)  ?              ?  Time: 5974-7185 ?OT Time Calculation (min): 32 min ?Charges:  OT General Charges ?$OT Visit: 1 Visit ?OT Evaluation ?$OT Eval Moderate Complexity: 1 Mod ?OT Treatments ?$Self Care/Home Management : 8-22 mins ? ?Ziaire Bieser H., OTR/L ?Acute Rehabilitation ? ?Torry Adamczak Elane Yolanda Bonine ?03/26/2022, 5:29 PM ?

## 2022-03-27 ENCOUNTER — Encounter: Payer: Self-pay | Admitting: Physical Therapy

## 2022-03-27 DIAGNOSIS — N10 Acute tubulo-interstitial nephritis: Secondary | ICD-10-CM | POA: Diagnosis not present

## 2022-03-27 LAB — BASIC METABOLIC PANEL
Anion gap: 5 (ref 5–15)
BUN: 5 mg/dL — ABNORMAL LOW (ref 6–20)
CO2: 32 mmol/L (ref 22–32)
Calcium: 9.3 mg/dL (ref 8.9–10.3)
Chloride: 100 mmol/L (ref 98–111)
Creatinine, Ser: 0.64 mg/dL (ref 0.44–1.00)
GFR, Estimated: >60 mL/min (ref >60)
Glucose, Bld: 91 mg/dL (ref 70–99)
Potassium: 3.6 mmol/L (ref 3.5–5.1)
Sodium: 137 mmol/L (ref 135–145)

## 2022-03-27 LAB — MAGNESIUM: Magnesium: 1.7 mg/dL (ref 1.7–2.4)

## 2022-03-27 MED ORDER — SULFAMETHOXAZOLE-TRIMETHOPRIM 800-160 MG PO TABS: 1.0000 | ORAL_TABLET | Freq: Two times a day (BID) | ORAL | 0 refills | Status: AC

## 2022-03-27 NOTE — Plan of Care (Signed)

## 2022-03-27 NOTE — Telephone Encounter (Signed)
Patient still inpatient status. Will follow up after discharge. ?

## 2022-03-27 NOTE — Discharge Summary (Signed)
PatientPhysician Discharge Summary  ?Rachel [name] WNI:[id number] DOB: [birth year] DOA: 03/23/2022 ? ?PCP: Charlott Rakes, MD ? ?Admit date: 03/23/2022 ?Discharge date: 03/27/2022 ?30 Day Unplanned Readmission Risk Score   ? ?Flowsheet Row ED to Hosp-Admission (Current) from 03/23/2022 in [facility] [place]  ?30 Day Unplanned Readmission Risk Score (%) 21.94 Filed at 03/27/2022 0801  ? ?  ? ? This score is the patient's risk of an unplanned readmission within 30 days of being discharged (0 -100%). The score is based on dignosis, age, lab data, medications, orders, and past utilization.   ?Low:  0-14.9   Medium: 15-21.9   High: 22-29.9   Extreme: 30 and above ? ?  ? ?  ? ? ? ?Admitted From: Home ?Disposition: Home ? ?Recommendations for Outpatient Follow-up:  ?Follow up with PCP in 1-2 weeks ?Please obtain BMP/CBC in one week ?Please follow up with your PCP on the following pending results: ?Unresulted Labs (From admission, onward)  ? ? None  ? ?  ?  ? ? ?Home Health: None ?Equipment/Devices: None ? ?Discharge Condition: Stable ?CODE STATUS: Full code ?Diet recommendation: Cardiac ? ?Subjective: Seen and examined.  She is sitting in the chair.  Mother at the bedside.  She is feeling much better.  No complaints.  She wants to go home. ? ?Brief/Interim Summary: Rachel [name] is a 36 y.o. 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  No dysuria or hematuria.  Upon arrival to ED, she was febrile ?CT renal stone: no renal stones/obstructive uropathy.  ?CT abdomen/pelvis: New abnormal hypoenhancing regions in the left kidney upper pole. ?Given the clinical context, the appearance favors acute left renal ?pyelonephritis ?In ED: started on rocephin, cultures ordered, IVF, toradol, morphine,  and dilaudid for pain.  ?Of note, Admitted to [facility] for pyelonephritis march 2023. Placed on rocephin with urine culture that was inconclusive. D/cd on omnicef x 14 days.  She was admitted with sepsis secondary to left sided pyelonephritis.  Started on Rocephin.  Urine culture grew E. coli.  This is pansensitive.  Patient has improved.  She is afebrile for last 48 hours.  She received 3-4 doses of Rocephin.  She is being discharged on 7 more days of Bactrim DS for complicated UTI/pyelonephritis.  She was seen by PT OT who recommended outpatient PT OT which is ordered for her. ?  ?Hypokalemia: Resolved. ?  ?Nausea/history of gastroparesis: Symptoms resolved. ?  ?Chronic abdominal pain: Continue morphine as needed. ?  ?Severe protein calorie malnutrition: Continue Ensure.  Nutrition consulted. ?  ?Vasomotor symptoms due to menopause ?S/p surgical menopause ?Continue premarin at her .'45mg'$  dosage. GYN just changed her dose, but they have not picked this up yet  ?  ?Irritable bowel syndrome (IBS): Stable resume home management. ?  ? ?Discharge plan was discussed with patient and/or family member and they verbalized understanding and agreed with it.  ?Discharge Diagnoses:  ?Active Problems: ?  sepsis secondary to pyelonephritis of left kidney ?  Sepsis ([place]) ?  Hypokalemia ?  Iron deficiency anemia ?  Chronic abdominal pain ?  Protein-calorie malnutrition, severe ?  Vasomotor symptoms due to menopause ?  Irritable bowel syndrome (IBS) ? ? ? ?Discharge Instructions ? ?Discharge Instructions   ? ? Ambulatory referral to Occupational Therapy   Complete by: As directed ?  ?  Ambulatory referral to Physical Therapy   Complete by: As directed ?  ? ?  ? ?Allergies as of 03/27/2022   ? ?   Reactions  ? Amphetamine-dextroamphetamine Other (See Comments)  ? Triggered bipolar, caused depression, and suicidal thoughts  ? Lupron [leuprolide] Anaphylaxis  ? Freida Busman [elagolix] Anaphylaxis  ? Penicillins Anaphylaxis, Swelling, Other (See  Comments)  ? Potential for anaphylaxis confirmed. Tolerates Cefphalosporins ?No "-cillins"!!!! ?Did it involve swelling of the face/tongue/throat, SOB, or low BP? Yes ?Did it involve sudden or severe rash/hives, skin peeling, or any reaction on the inside of your mouth or nose? Unknown ?Did you need to seek medical attention at a hospital or doctor's office? Unknown ?When did it last happen? teenager      ?If all above answers are "NO", may proceed with cephalosporin use.  ? Chlorhexidine Hives  ? DuraPrep   ? Doxycycline Itching, Nausea And Vomiting, Other (See Comments)  ? Caused high fever, also  ? Mixed Ragweed Other (See Comments)  ? Unknown, Mother and pt state she can't have   ? Olanzapine Other (See Comments)  ? Caused mania  ? Other Other (See Comments)  ? Stimulants, mood stabilizers, and antidepressants = Triggered bipolar, caused depression, and suicidal thoughts  ? Prozac [fluoxetine Hcl] Other (See Comments)  ? Triggered bipolar, caused depression, and suicidal thoughts  ? Stevia Glycerite Extract [flavoring Agent] Other (See Comments)  ? Unknown reaction; mother and pt confirm any Artificial Sweeteners cause reaction  ? Tape Other (See Comments)  ? Paper tape is tolerated  ? Triple Antibiotic Pain Relief [neomy-bacit-polymyx-pramoxine] Hives  ? Reglan [metoclopramide] Anxiety, Other (See Comments)  ? Triggered bipolar, caused depression, suicidal thoughts, muscle twitching, stiffness, and high anxiety   ? ?  ? ?  ?Medication List  ?  ? ?TAKE these medications   ? ?ADULT ONE DAILY GUMMIES PO ?Take 2 tablets by mouth daily. ?  ?albuterol 108 (90 Base) MCG/ACT inhaler ?Commonly known as: VENTOLIN HFA ?Inhale 1-2 puffs into the lungs every 6 (six) hours as needed for wheezing or shortness of breath. ?  ?albuterol (2.5 MG/3ML) 0.083% nebulizer solution ?Commonly known as: PROVENTIL ?Take 3 mLs (2.5 mg total) by nebulization every 6 (six) hours as needed for wheezing or shortness of breath. Max 4 doses per  day ?  ?B COMPLEX PO ?Take 1 capsule by mouth daily. ?  ?Calcium-Magnesium-Vitamin D 600-40-500 MG-MG-UNIT Tb24 ?Take 3 tablets by mouth in the morning and at bedtime. ?  ?ELDERBERRY PO ?Take 1 tablet by mouth daily. ?  ?EPINEPHrine 0.3 mg/0.3 mL Soaj injection ?Commonly known as: EPI-PEN ?Inject 0.3 mg into the muscle as needed for anaphylaxis. ?  ?estrogens (conjugated) 0.45 MG tablet ?Commonly known as: Premarin ?Take 1 tablet (0.45 mg total) by mouth daily. ?What changed: Another medication with the same name was added. Make sure you understand how and when to take each. ?  ?estrogens (conjugated) 0.625 MG tablet ?Commonly known as: Premarin ?Take 1 tablet (0.625 mg total) by mouth daily. Take daily for 21 days then do not take for 7 days. ?What changed: You were already taking a medication with the same name, and this prescription was added. Make sure you understand how and when to take each. ?  ?fexofenadine 180 MG tablet ?Commonly known as: ALLEGRA ?Take 180 mg by mouth daily. ?  ?Fish Oil 1200 MG Caps ?Take 1,200 mg by mouth daily. ?  ?ibuprofen 600 MG tablet ?Commonly known as: ADVIL ?Take 1 tablet (600 mg  total) by mouth every 6 (six) hours as needed for headache, mild pain, moderate pain or cramping. ?  ?IRON PO ?Take 1 capsule by mouth daily. ?  ?Linzess 290 MCG Caps capsule ?Generic drug: linaclotide ?TAKE ONE CAPSULE BY MOUTH DAILY BEFORE BREAKFAST ?What changed: See the new instructions. ?  ?Melatonin 10 MG Tabs ?Take 10 mg by mouth at bedtime. ?  ?MiraLax 17 GM/SCOOP powder ?Generic drug: polyethylene glycol powder ?Take 17 g by mouth See admin instructions. Mix 17 g of powder into water and drink once a day ?  ?promethazine 12.5 MG tablet ?Commonly known as: PHENERGAN ?Take 12.5 mg by mouth See admin instructions. Take 12.5 mg by mouth every 6-8 hours as needed for nausea ?  ?sennosides-docusate sodium 8.6-50 MG tablet ?Commonly known as: SENOKOT-S ?Take 1-2 tablets by mouth daily. ?   ?sulfamethoxazole-trimethoprim 800-160 MG tablet ?Commonly known as: BACTRIM DS ?Take 1 tablet by mouth 2 (two) times daily for 7 days. ?  ?VITAMIN A PO ?Take 1 capsule by mouth daily. ?  ?vitamin C 1000 MG tablet ?T

## 2022-03-27 NOTE — TOC Transition Note (Signed)
Transition of Care (TOC) - CM/SW Discharge Note ? ? ?Patient Details  ?Name: Philena Obey ?MRN: [id number] ?Date of Birth: Jul 01, 1986 ? ?Transition of Care (TOC) CM/SW Contact:  ?Tom-Johnson, Renea Ee, RN ?Phone Number: ?03/27/2022, 11:35 AM ? ? ?Clinical Narrative:    ? ?Patient is scheduled for discharge today. Admitted for Severe Protein-Calorie Malnutrition.  ?From home with four generation of family. Does not have any children and on disability.  ?Currently active at [facility] for Pel[facility] Therapy and at [facility] for Aquatic Therapy. PT/OT recommended outpatient PT/OT. Info on AVS. Patient to call to schedule first visit.  ?PCP is Charlott Rakes, MD and uses [place] [facility].  ?Family to transport at discharge. No further TOC needs noted. ? ?Final next level of care: OP Rehab ?Barriers to Discharge: Barriers Resolved ? ? ?Patient Goals and CMS Choice ?Patient states their goals for this hospitalization and ongoing recovery are:: To return home ?CMS Medicare.gov Compare Post Acute Care list provided to:: Patient ?Choice offered to / list presented to : Patient ? ?Discharge Placement ?  ?           ?  ?Patient to be transferred to facility by: Family ?  ?  ? ?Discharge Plan and Services ?  ?  ?           ?DME Arranged: N/A ?DME Agency: NA ?  ?  ?  ?HH Arranged: NA ?[place] Agency: NA ?  ?  ?  ? ?Social Determinants of Health (SDOH) Interventions ?  ? ? ?Readmission Risk Interventions ?   ? View : No data to display.  ?  ?  ?  ? ? ? ? ? ?

## 2022-03-27 NOTE — BH Specialist Note (Addendum)
Integrated Behavioral Health via Telemedicine Visit  04/06/2022 Rachel [name] [id number]  Number of [place] Clinician visits: Additional Visit  Session Start time: 6948   Session End time: 5462  Total time in minutes: 40   Referring Provider: Lynnda Shields, MD Rachel/Family location: Home [facility] Provider location: [facility] for [facility]  All persons participating in visit: Rachel [name] and Albert   Types of Service: Individual psychotherapy and Telephone visit  I connected with [name] and/or [place]  via  Telephone or Video Enabled Telemedicine Application  (Video is Tree surgeon) and verified that I am speaking with the correct person using two identifiers. Discussed confidentiality: Yes   I discussed the limitations of telemedicine and the availability of in person appointments.  Discussed there is a possibility of technology failure and discussed alternative modes of communication if that failure occurs.  I discussed that engaging in this telemedicine visit, they consent to the provision of behavioral healthcare and the services will be billed under their insurance.  Rachel and/or legal guardian expressed understanding and consented to Telemedicine visit: Yes   Presenting Concerns: Rachel and/or family reports the following symptoms/concerns: Uncertainty regarding health and relationship issues; helps to talk through feelings Duration of problem: Ongoing; Severity of problem: moderate   Rachel and/or Family's Strengths/Protective Factors: Social connections, Social and Emotional competence, Concrete supports in place (healthy food, safe environments, etc.), Sense of purpose, and Physical Health (exercise, healthy diet, medication compliance, etc.)  Goals Addressed: Rachel will:  Reduce symptoms of: anxiety and stress   Demonstrate  ability to: Increase healthy adjustment to current life circumstances  Progress towards Goals: Ongoing  Interventions: Interventions utilized:  Supportive Reflection Standardized Assessments completed: Not Needed  Rachel and/or Family Response: Rachel agrees with treatment plan.   Assessment: Rachel currently experiencing ADHD, PTSD; Psychosocial stress.   Rachel may benefit from continued therapeutic interventions.  Plan: Follow up with behavioral health clinician on : Two weeks Behavioral recommendations:  -Continue self-coping strategies daily to manage emotions -At upcoming PT appointment, inquire about number of sessions remaining for peace of mind Referral(s): [place] (In Clinic)  I discussed the assessment and treatment plan with the Rachel and/or parent/guardian. They were provided an opportunity to ask questions and all were answered. They agreed with the plan and demonstrated an understanding of the instructions.   They were advised to call back or seek an in-person evaluation if the symptoms worsen or if the condition fails to improve as anticipated.  Caroleen Hamman Deundra Furber, LCSW

## 2022-03-28 ENCOUNTER — Telehealth: Payer: Self-pay

## 2022-03-28 DIAGNOSIS — R531 Weakness: Secondary | ICD-10-CM

## 2022-03-28 NOTE — Telephone Encounter (Signed)
Transition Care Management Follow-up Telephone Call ?Date of discharge and from where: 03/27/2022, [facility] ?How have you been since you were released from the hospital? She said she is not 100% but she is getting better, just doesn't have much energy.[name] Kitchen  ?Any questions or concerns? Yes -  she would like to have all of her appointments in person, not vitrual. ?She would like to thank Dr Margarita Rana for helping her received aquatic and pelvic floor therapies. She said she can use as much therapy as possible. ?She would like to speak to Dr Margarita Rana about where/how she can be tested for [facility] Danlos syndrome as well as a comorbidity- Thoracic Outlet Syndrome.  She believes this is what she is trying to deal with and would like a definitve diagnosis/ diagnoses.  ? ?Items Reviewed: ?Did the pt receive and understand the discharge instructions provided? Yes  ?Medications obtained and verified? Yes  - she said she has all of her medications and has a nebulizer. There are 2 orders for premarin on her AVS.  She said she is taking the higher dose ( 0.625 mg) and will clarify with Dr Elgie Congo when/ if she should change back to 0.45 mg daily.  ?Other? No  ?Any new allergies since your discharge? No  ?Dietary orders reviewed? Yes -she said her appetite is poor but she is making herself eat.  ?Do you have support at home? Yes  - she lives with her family ? ?Home Care and Equipment/Supplies: ?Were home health services ordered? no ?If so, what is the name of the agency? N/a  ?Has the agency set up a time to come to the patient's home? not applicable ?Were any new equipment or medical supplies ordered?  No ?What is the name of the medical supply agency? N/a ?Were you able to get the supplies/equipment? not applicable ?Do you have any questions related to the use of the equipment or supplies? No ? ?She has been referred for outpatient PT and OT and she already attends aquatic therapy and pelvic floor therapy ? ?Functional  Questionnaire: (I = Independent and D = Dependent) ?ADLs: independent when she is feeling good. ? ?Follow up appointments reviewed: ? ?PCP Hospital f/u appt confirmed? Yes  Scheduled to see Dr Margarita Rana - 6/20/230. She wants to have he visit in person and was upset that visits in the past have been converted to virtual without notifying her.  ?[facility] f/u appt confirmed?  She said she needs to see cardiology, she is not sure why, but an appointment has not been scheduled yet.   ?Are transportation arrangements needed? No  ?If their condition worsens, is the pt aware to call PCP or go to the Emergency Dept.? Yes ?Was the patient provided with contact information for the PCP's office or ED? Yes ?Was to pt encouraged to call back with questions or concerns? Yes ? ?

## 2022-03-28 NOTE — Telephone Encounter (Signed)
From the discharge call: ? ?She said she is not 100% but she is getting better, just doesn't have much energy. ? ?she would like to have all of her appointments in person, not vitrual. ? ?She would like to thank Dr Margarita Rana for helping her received aquatic and pelvic floor therapies. She said she can use as much therapy as possible. ? ?She would like to speak to Dr Margarita Rana about where/how she can be tested for [facility] Danlos syndrome as well as a comorbidity- Thoracic Outlet Syndrome.  She believes this is what she is trying to deal with and would like a definitve diagnosis/ diagnoses.  ? ?she said she has all of her medications and has a nebulizer. There are 2 orders for premarin on her AVS.  She said she is taking the higher dose ( 0.625 mg) and will clarify with Dr Elgie Congo when/ if she should change back to 0.45 mg daily.  ? ?she said her appetite is poor but she is making herself eat.  ? ?Scheduled to see Dr Margarita Rana - 6/20/230. She wants to have he visit in person and was upset that visits in the past have been converted to virtual without notifying her.  ? ? ?Dr Margarita Rana - if you want to see her before 6/20 please let me know and I will contact her.  She said she will keep the 6/20 appointment unless you want to see her sooner ? ?

## 2022-03-28 NOTE — Telephone Encounter (Signed)
Transition Care Management Unsuccessful Follow-up Telephone Call ? ?Date of discharge and from where:  03/26/2022 from [facility] ? ?Attempts:  1st Attempt ? ?Reason for unsuccessful TCM follow-up call:  Left voice message ? ? ? ?

## 2022-03-29 LAB — CULTURE, BLOOD (ROUTINE X 2)
Culture: NO GROWTH
Culture: NO GROWTH
Special Requests: ADEQUATE
Special Requests: ADEQUATE

## 2022-03-29 NOTE — Telephone Encounter (Signed)
Called pt. Pt states she has picked up new dose of HRT. Reviewed admin instructions. Pt reports concern regarding osteoporosis due to new back pain. Reviewed with Elgie Congo, MD who states no concern for this 1 year out from surgery. States due to HRT, risk for osteoporosis is low. Explained to patient and explained that we can check for issues related to bone density 3 years from surgery. Encouraged pt to follow up with PT regarding back pain.  ?

## 2022-03-29 NOTE — Telephone Encounter (Signed)
Transition Care Management Unsuccessful Follow-up Telephone Call ? ?Date of discharge and from where:  03/26/2022 from [facility] ? ?Attempts:  2nd Attempt ? ?Reason for unsuccessful TCM follow-up call:  Left voice message ? ? ? ?

## 2022-03-30 NOTE — Telephone Encounter (Signed)
I tried calling her but could not reach her.  Please inform her that as a starting point I can refer her to the genetics clinic if she would like for genetic testing for Ehlers-Danlos syndrome.  We can discuss her concern about thoracic outlet syndrome at her upcoming visit.

## 2022-03-30 NOTE — Telephone Encounter (Signed)
Call placed to patient to informed her that Dr Margarita Rana can refer her to the the genetics clinic if she would like for genetic testing for Ehlers-Danlos syndrome. She will discuss her concern about thoracic outlet syndrome at her upcoming visit. Message left for patient with call back requested to this CM

## 2022-03-31 ENCOUNTER — Encounter (HOSPITAL_BASED_OUTPATIENT_CLINIC_OR_DEPARTMENT_OTHER): Payer: Self-pay | Admitting: Physical Therapy

## 2022-03-31 ENCOUNTER — Ambulatory Visit (HOSPITAL_BASED_OUTPATIENT_CLINIC_OR_DEPARTMENT_OTHER): Payer: Medicaid Other | Attending: Family Medicine | Admitting: Physical Therapy

## 2022-03-31 DIAGNOSIS — R279 Unspecified lack of coordination: Secondary | ICD-10-CM | POA: Insufficient documentation

## 2022-03-31 DIAGNOSIS — M6281 Muscle weakness (generalized): Secondary | ICD-10-CM | POA: Diagnosis not present

## 2022-03-31 DIAGNOSIS — M791 Myalgia, unspecified site: Secondary | ICD-10-CM | POA: Diagnosis not present

## 2022-03-31 DIAGNOSIS — R262 Difficulty in walking, not elsewhere classified: Secondary | ICD-10-CM | POA: Diagnosis not present

## 2022-03-31 DIAGNOSIS — M62838 Other muscle spasm: Secondary | ICD-10-CM | POA: Diagnosis not present

## 2022-03-31 NOTE — Telephone Encounter (Signed)
Transition Care Management Unsuccessful Follow-up Telephone Call  Date of discharge and from where:  03/26/2022 from [facility]  Attempts:  3rd Attempt  Reason for unsuccessful TCM follow-up call:  Unable to reach patient

## 2022-03-31 NOTE — Therapy (Signed)
OUTPATIENT PHYSICAL THERAPY TREATMENT NOTE   Patient Name: Rachel [name] MRN: [id number] DOB:November 07, 1986, 36 y.o., female Today's Date: 03/31/2022   END OF SESSION:   PT End of Session - 03/31/22 1033     Visit Number 7    Date for PT Re-Evaluation 04/26/22    Authorization Type medicaid healthy blue    PT Start Time 1030    PT Stop Time 1110    PT Time Calculation (min) 40 min    Activity Tolerance Treatment limited secondary to medical complications (Comment);Patient limited by pain    Behavior During Therapy [facility] for tasks assessed/performed             Past Medical History:  Diagnosis Date   ADHD (attention deficit hyperactivity disorder)    Anemia    Anxiety    Asthma    Bipolar disorder ([place])    Complication of anesthesia    slow to wake up    Delayed gastric emptying 02/23/2020   Depression    DUB (dysfunctional uterine bleeding) 02/23/2020   Ehlers-Danlos syndrome    Endometriosis    Gastroparesis    GERD (gastroesophageal reflux disease)    Heart murmur    Hiatal hernia 04/06/2020   Myalgia 04/06/2020   Parasitic infection 02/23/2020   Pneumonia    Poor appetite 02/23/2020   RUQ pain 02/23/2020   Vitamin D deficiency    Weight loss 02/23/2020   Past Surgical History:  Procedure Laterality Date   barium study     COLONOSCOPY     CYSTOSCOPY N/A 07/12/2021   Procedure: CYSTOSCOPY;  Surgeon: [name], MD;  Location: [place];  Service: Gynecology;  Laterality: N/A;   ESOPHAGOGASTRODUODENOSCOPY  2011   and a ph study as well.    LAPAROSCOPY N/A 01/26/2021   Procedure: LAPAROSCOPY DIAGNOSTIC;  Surgeon: [name], MD;  Location: [place];  Service: Gynecology;  Laterality: N/A;   NASAL SEPTUM SURGERY     TONGUE FLAP     TOTAL LAPAROSCOPIC HYSTERECTOMY WITH SALPINGECTOMY Bilateral 07/12/2021   Procedure: TOTAL LAPAROSCOPIC HYSTERECTOMY WITH SALPINGECTOMY, OOPHORECTOMY;  Surgeon: [name], MD;  Location: [place];   Service: Gynecology;  Laterality: Bilateral;   UPPER GASTROINTESTINAL ENDOSCOPY     WISDOM TOOTH EXTRACTION     Patient Active Problem List   Diagnosis Date Noted   Iron deficiency anemia 01/20/2022   Acute hyponatremia 01/18/2022   Pyelonephritis 01/18/2022   Protein-calorie malnutrition, severe 01/18/2022   sepsis secondary to pyelonephritis of left kidney 01/17/2022   Sepsis ([place]) 01/17/2022   Chronic abdominal pain 01/17/2022   Dyspnea 01/17/2022   Underweight 01/17/2022   Irritable bowel syndrome (IBS) 01/17/2022   Hypokalemia 01/17/2022   Vasomotor symptoms due to menopause 08/17/2021   S/P laparoscopic hysterectomy 07/12/2021   Preoperative exam for gynecologic surgery 06/20/2021   Adverse drug reaction 06/01/2021   Draining postoperative wound 02/28/2021   Encounter for postoperative care 02/16/2021   Endometriosis determined by laparoscopy    Pelvic pain in female 10/11/2020   Presence of subdermal contraceptive implant 10/11/2020   Dysmenorrhea 10/11/2020   Dyspareunia in female 10/11/2020   NSVT (nonsustained ventricular tachycardia) ([place]) 09/03/2020   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   Palpitations 07/26/2020   Family history of connective tissue disease 07/26/2020   Myalgia 04/06/2020   Hiatal hernia 04/06/2020   Parasitic infection 02/23/2020   Weight loss 02/23/2020   Poor appetite 02/23/2020   RUQ pain 02/23/2020   Delayed gastric emptying 02/23/2020  DUB (dysfunctional uterine bleeding) 02/23/2020   Cervical radiculopathy 05/31/2018   Hyperalgesia 05/31/2018   Acid reflux 12/26/2017   ADHD (attention deficit hyperactivity disorder) 05/07/2012   Bipolar 1 disorder ([place]) 05/07/2012    RPCP: Charlott Rakes, MD   REFERRING PROVIDER: Charlott Rakes, MD   REFERRING DIAG: M79.10 (ICD-10-CM) - Myalgia   THERAPY DIAG:  Myalgia   Muscle weakness (generalized)   Difficulty in walking, not elsewhere classified   ONSET DATE: >35yr   SUBJECTIVE:                                                                                                                                                                                             SUBJECTIVE STATEMENT: "In hospital last week due to UTI/kidney infection and sepsis.  Fell good today" PERTINENT HISTORY:  Irritable bowel syndrome with both constipation and diarrhea          Moderate protein-calorie malnutrition bipolar disorder, anxiety, depression, GERD, IBS with constipation, status post laparoscopic TAHBSO due to abnormal uterine bleed    currently on Premarin by GYN) She was hospitalized for pyelonephritis from 01/17/2022 through 01/21/2022. PAIN:  Are you having pain? Yes: NPRS scale: 7/10 Pain location: cerv spine, left shoulder and arm, LB, hips Pain description: ache Aggravating factors: overdoing although tolerance levels vary Relieving factors: rest     PRECAUTIONS: Other: being [facility]/hypermobile joints   WEIGHT BEARING RESTRICTIONS No   FALLS:  Has patient fallen in last 6 months? No   LIVING ENVIRONMENT: Lives with: lives with their family Lives in: House/apartment Stairs: Yes: Internal: 1 flight steps; yes     OCCUPATION: unable to work   PLOF: Independent with basic ADLs   PATIENT GOALS gain strength, decrease pain     OBJECTIVE:    COGNITION:           Overall cognitive status:  WFL although pt reports bouts of brain fog                                  SENSATION: Numbness and tingling LUE into cervical spine intermittent Occasional numbness and tingling in LE   MUSCLE LENGTH: Connective tissue disorder probable/hypermobile   POSTURE:  Very slight figure, slightly rounded shoulders       ROM throughout wfl Strength UE 3+/5 LE 3/5     FUNCTIONAL TESTS:  5 times sit to stand: 38 Timed up and go (TUG): 18 Berg Balance Scale: 21/56   GAIT: Distance walked: 300 Assistive device utilized: None Level of assistance: Complete  Independence  Comments: decreased stride length. Upon initiation of gait cadence slowed       TODAY'S TREATMENT  Pt seen for aquatic therapy today.  Treatment took place in water 3.25-4.8 ft in depth at the [facility] pool. Temp of water was 91.  Pt entered/exited the pool via stairs step through pattern independently with bilat rail.  Introduction to setting  Walking with and without ue (yellow hand buoys) support forward, back and sidestepping x4 widths each -seated rest period  Standing 3 ft: df;pf; marching;add/abd;hip flex/ext x10 ue supported by yellow noodle -seated rest period after  Standing in 4 ft 1/4 noodle (noodle that is cut) pull down to knees 2x7  Straddling noodle cycling; add/abd; scissor   Standing 3.63f: kick board push down with cues for abdominal bracing and glut contraction 2x10 -push/pull x 7     Pt requires buoyancy for support and to offload joints with strengthening exercises. Viscosity of the water is needed for resistance of strengthening; water current perturbations provides challenge to standing balance unsupported, requiring increased core activation.      PATIENT EDUCATION:  Education details: Propertires of water, benefit of aquatic therapy Person educated:  pt and mother Education method: Explanation Education comprehension: verbalized understanding     HOME EXERCISE PROGRAM: Aquatic HEP to be assigned   ASSESSMENT:   CLINICAL IMPRESSION: Pt indep and safe in setting.  Caution used throughout session in attempts to avoid overdoing as pt is highly fatigable. She is given multiple rest periods throughout. EDU on properties of water and advantages of aquatic therapy as session continues.  She VU.  Worked on core and LE strengthening giving cues to ensure working in normal ranges (not over stretching due to hypermobility) and with appropriate intensity (not over working).  She tolerated well although did report we may have overdone a  little.  Will assess toleration level at next session.  Patient is a 36y.o. F who was seen today for physical therapy evaluation and treatment for myalgia. She is also being seen for [facility] floor [facility] Pt has had hx of pain sx potentially due to a connective tissue disorder. She presents frail, slight frame ~ 97lbs.  Gastroparesis inhibiting improved eating patterns.  Pt not working. Good days she may be able to walk for 30 minutes, bad days in bed.  She will benefit from skilled aquatic PT to improve strength, toleration to activity and enhance motility of gut through movement using properties of water to facilitate progression towards goals decreasing external factors.     OBJECTIVE IMPAIRMENTS decreased activity tolerance, decreased balance, decreased endurance, decreased mobility, and decreased strength.    ACTIVITY LIMITATIONS cleaning, community activity, driving, meal prep, occupation, laundry, yard work, and shopping.    PERSONAL FACTORS Age, Behavior pattern, and 1-2 comorbidities: IBS, malnutrition  are also affecting patient's functional outcome.      REHAB POTENTIAL: fair   CLINICAL DECISION MAKING: Evolving/moderate complexity   EVALUATION COMPLEXITY: Moderate     GOALS: Goals reviewed with patient? Yes   SHORT TERM GOALS: Target date: 04/04/2022   Pt will tolerate >30 minute aquatic session with rest periods as needed to demonstrate improved toleration to activity Baseline:Not yet completed Goal status: INITIAL   2.  Pt will be indep with initial aquatic HEP with access to a pool Baseline: no HEP Goal status: INITIAL   3.  Pt will improve on LE strength up 1/2 to 1 full grade Baseline:3/5 Goal status: INITIAL  LONG TERM GOALS: Target date: 05/02/2022   Pt will improve on Berg Balance test to >or= 35/56 to demonstrate improved balance and decreased fall risk Baseline: 21/56 Goal status: INITIAL   2.  Pt will improve on 5xSTS test to  <25s Baseline: 38 Goal status: INITIAL   3.  Pt will gain up to 2lb to improve health by increasing appetite through increased movement Baseline: 97 lbs Goal status: INITIAL   4.  Pt will be indep with final HEP with knowledge how to adjust according to her day to day ability to tolerate Baseline: no HEP Goal status: INITIAL         PLAN: PT FREQUENCY: 1x/week   PT DURATION: 8 weeks   PLANNED INTERVENTIONS: Therapeutic exercises, Therapeutic activity, Neuromuscular re-education, Balance training, Gait training, Patient/Family education, Joint mobilization, Stair training, Aquatic Therapy, and Taping.   PLAN FOR NEXT SESSION: toleration to last session?? Added adductor sets with buoyancy ball to overlap pelvic floor exercises with strengthening of LE in aquatics  [place] ([place]) Joette Schmoker MPT 03/31/2022, 12:15 PM

## 2022-04-03 ENCOUNTER — Ambulatory Visit: Payer: Medicaid Other | Admitting: Physical Therapy

## 2022-04-03 ENCOUNTER — Encounter: Payer: Self-pay | Admitting: Physical Therapy

## 2022-04-03 DIAGNOSIS — R279 Unspecified lack of coordination: Secondary | ICD-10-CM | POA: Diagnosis not present

## 2022-04-03 DIAGNOSIS — M6281 Muscle weakness (generalized): Secondary | ICD-10-CM | POA: Diagnosis not present

## 2022-04-03 DIAGNOSIS — M62838 Other muscle spasm: Secondary | ICD-10-CM

## 2022-04-03 NOTE — Telephone Encounter (Signed)
Attempted again to contact patient # [phone] to inform her that Dr Margarita Rana can refer her to the the genetics clinic if she would like for genetic testing for Ehlers-Danlos syndrome. She will discuss her concern about thoracic outlet syndrome at her upcoming visit. Message left for patient with call back requested to this CM

## 2022-04-03 NOTE — Therapy (Signed)
OUTPATIENT PHYSICAL THERAPY TREATMENT NOTE   Patient Name: Rachel [name] MRN: [id number] DOB:01-06-1986, 36 y.o., female Today's Date: 04/03/2022  PCP: Charlott Rakes, MD REFERRING PROVIDER: Charlott Rakes, MD  END OF SESSION:   PT End of Session - 04/03/22 1613     Visit Number 8   7/12 pelvic   Number of Visits 12   for pelvic   Date for PT Re-Evaluation 04/26/22    Authorization Type medicaid healthy blue    PT Start Time 6803    PT Stop Time 1705    PT Time Calculation (min) 50 min    Activity Tolerance Patient limited by pain;Patient tolerated treatment well    Behavior During Therapy [facility] for tasks assessed/performed                 Past Medical History:  Diagnosis Date   ADHD (attention deficit hyperactivity disorder)    Anemia    Anxiety    Asthma    Bipolar disorder ([place])    Complication of anesthesia    slow to wake up    Delayed gastric emptying 02/23/2020   Depression    DUB (dysfunctional uterine bleeding) 02/23/2020   Ehlers-Danlos syndrome    Endometriosis    Gastroparesis    GERD (gastroesophageal reflux disease)    Heart murmur    Hiatal hernia 04/06/2020   Myalgia 04/06/2020   Parasitic infection 02/23/2020   Pneumonia    Poor appetite 02/23/2020   RUQ pain 02/23/2020   Vitamin D deficiency    Weight loss 02/23/2020   Past Surgical History:  Procedure Laterality Date   barium study     COLONOSCOPY     CYSTOSCOPY N/A 07/12/2021   Procedure: CYSTOSCOPY;  Surgeon: [name], MD;  Location: [place];  Service: Gynecology;  Laterality: N/A;   ESOPHAGOGASTRODUODENOSCOPY  2011   and a ph study as well.    LAPAROSCOPY N/A 01/26/2021   Procedure: LAPAROSCOPY DIAGNOSTIC;  Surgeon: [name], MD;  Location: [place];  Service: Gynecology;  Laterality: N/A;   NASAL SEPTUM SURGERY     TONGUE FLAP     TOTAL LAPAROSCOPIC HYSTERECTOMY WITH SALPINGECTOMY Bilateral 07/12/2021   Procedure: TOTAL LAPAROSCOPIC  HYSTERECTOMY WITH SALPINGECTOMY, OOPHORECTOMY;  Surgeon: [name], MD;  Location: [place];  Service: Gynecology;  Laterality: Bilateral;   UPPER GASTROINTESTINAL ENDOSCOPY     WISDOM TOOTH EXTRACTION     Patient Active Problem List   Diagnosis Date Noted   Iron deficiency anemia 01/20/2022   Acute hyponatremia 01/18/2022   Pyelonephritis 01/18/2022   Protein-calorie malnutrition, severe 01/18/2022   sepsis secondary to pyelonephritis of left kidney 01/17/2022   Sepsis ([place]) 01/17/2022   Chronic abdominal pain 01/17/2022   Dyspnea 01/17/2022   Underweight 01/17/2022   Irritable bowel syndrome (IBS) 01/17/2022   Hypokalemia 01/17/2022   Vasomotor symptoms due to menopause 08/17/2021   S/P laparoscopic hysterectomy 07/12/2021   Preoperative exam for gynecologic surgery 06/20/2021   Adverse drug reaction 06/01/2021   Draining postoperative wound 02/28/2021   Encounter for postoperative care 02/16/2021   Endometriosis determined by laparoscopy    Pelvic pain in female 10/11/2020   Presence of subdermal contraceptive implant 10/11/2020   Dysmenorrhea 10/11/2020   Dyspareunia in female 10/11/2020   NSVT (nonsustained ventricular tachycardia) ([place]) 09/03/2020   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   Palpitations 07/26/2020   Family history of connective tissue disease 07/26/2020   Myalgia 04/06/2020   Hiatal hernia 04/06/2020   Parasitic infection 02/23/2020  Weight loss 02/23/2020   Poor appetite 02/23/2020   RUQ pain 02/23/2020   Delayed gastric emptying 02/23/2020   DUB (dysfunctional uterine bleeding) 02/23/2020   Cervical radiculopathy 05/31/2018   Hyperalgesia 05/31/2018   Acid reflux 12/26/2017   ADHD (attention deficit hyperactivity disorder) 05/07/2012   Bipolar 1 disorder ([place]) 05/07/2012    REFERRING DIAG:  K59.04 (ICD-10-CM) - Chronic idiopathic constipation  K58.1 (ICD-10-CM) - Irritable bowel syndrome with constipation  M62.89 (ICD-10-CM) - Pelvic floor  dysfunction      THERAPY DIAG:  Muscle weakness (generalized)  Unspecified lack of coordination  Other muscle spasm  PERTINENT HISTORY: hysterectomy, myalgia, bipolar  PRECAUTIONS: None  SUBJECTIVE: Pt was in the hospital due to a kidney infection and it was the same kidney that was infected.  I just finished antibiotics yesterday.  Antibiotics and morphine were given to be so everthing is thrown off.  Very bloated and slow. PAIN:  Are you having pain? Yes NPRS scale: 7/10 Pain location: abdomen Pain orientation: Other: general   PAIN TYPE: pressure and gnawing and bloating Pain description: intermittent  Aggravating factors: being backed up Relieving factors: lying down is better   LE ROM:                         Hypermobilty bil hips   LE MMT: Hip adduction 4/5 bil; ext 4/5 bil   PELVIC MMT:   MMT   02/01/2022  Vaginal    Internal Anal Sphincter    External Anal Sphincter    Puborectalis    Diastasis Recti    1/5 MMT rectal assessment with 2 sec hold; very slow to contract and relax only does 1 rep in 10 seconds         PALPATION:                   External Perineal Exam high tone, flexed coccyx                             Internal Pelvic Floor see above and very TTP with light touch, unable to palpate beyond puborectalis   TONE: High with weakness       TODAY'S TREATMENT  Treatment: 04/03/22 Patient confirms identification and approves physical therapist to perform internal soft tissue work  Manual Internal to anal sphincters and pubrectalis external to coccyx Abdominal release and mobility of ascending, transverse colon Lumbar and thoracic paraspinals Trigger Point Dry-Needling  Treatment instructions: Expect mild to moderate muscle soreness. S/S of pneumothorax if dry needled over a lung field, and to seek immediate medical attention should they occur. Patient verbalized understanding of these instructions and education.  Patient Consent Given:  Yes Education handout provided: Previously provided Muscles treated: lumbar and thoracic multifidi Electrical stimulation performed: No Parameters: N/A Treatment response/outcome: increased soft tissue length palpated  Treatment: 03/20/22  Manual Abdominal release and mobility of ascending, descending colon, stomach Lumbar and thoracic paraspinals  Trigger Point Dry-Needling  Treatment instructions: Expect mild to moderate muscle soreness. S/S of pneumothorax if dry needled over a lung field, and to seek immediate medical attention should they occur. Patient verbalized understanding of these instructions and education.  Patient Consent Given: Yes Education handout provided: Previously provided Muscles treated: lumbar and thoracic multifidi Electrical stimulation performed: No Parameters: N/A Treatment response/outcome: increased soft tissue length palpated   Treatment: 03/14/22  Manual Abdominal release and mobility of ascending, descending colon, stomach  Lumbar and thoracic paraspinals Trigger Point Dry-Needling  Treatment instructions: Expect mild to moderate muscle soreness. S/S of pneumothorax if dry needled over a lung field, and to seek immediate medical attention should they occur. Patient verbalized understanding of these instructions and education.  Patient Consent Given: Yes Education handout provided: Previously provided Muscles treated: lumbar and thoracic multifidi Electrical stimulation performed: No Parameters: N/A Treatment response/outcome: twitch and muscle length improved    Treatment: 03/10/22 Exercises  Rock pose for mild inversion Manual Abdominal release and mobility of ascending, descending, sigmoid colon Lumbar and thoracic paraspinals Trigger Point Dry-Needling  Treatment instructions: Expect mild to moderate muscle soreness. S/S of pneumothorax if dry needled over a lung field, and to seek immediate medical attention should they occur. Patient  verbalized understanding of these instructions and education.  Patient Consent Given: Yes Education handout provided: Yes Muscles treated: lumbar and thoracic multifidi Electrical stimulation performed: No Parameters: N/A Treatment response/outcome: twitch Rt thoracic; palpated increased soft tissue length throughout      PATIENT EDUCATION:  Education details: dilators and initial HEP Person educated: Patient and Armed forces training and education officer method: Explanation, Demonstration, Tactile cues, Verbal cues, and Handouts Education comprehension: verbalized understanding and returned demonstration     HOME EXERCISE PROGRAM:   Access Code: [facility] URL: [email or web address] Date: 02/01/2022 Prepared by: Jari Favre   Exercises - Ball squeeze with Kegel  - 3 x daily - 7 x weekly - 1 sets - 10 reps - 3 sec hold     ASSESSMENT:   CLINICAL IMPRESSION: Today's session focused on breathing and bulging the pelvic floor with tactile cues and pt tolerated internal STM well.  She was unable to get bulging 80% of the time.  2/10 tries she got very minimal bulging of the sphincter muscles.  Pt still feels like there is improvement with fascial release to abdomen and she is having more bowel movement after that treatment.  Continue with treatment and progression of pelvic floor soft tissue release as tolerated.  Educated on rectal dilators as well.       OBJECTIVE IMPAIRMENTS decreased coordination, decreased endurance, decreased strength, and impaired tone.    ACTIVITY LIMITATIONS community activity and toileting  .    PERSONAL FACTORS Past/current experiences, Time since onset of injury/illness/exacerbation, and 3+ comorbidities: slow transit bowels, myalgia, IBS  are also affecting patient's functional outcome.      REHAB POTENTIAL: Good   CLINICAL DECISION MAKING: Unstable/unpredictable   EVALUATION COMPLEXITY: Moderate     GOALS: Goals reviewed with patient? Yes    SHORT TERM GOALS: Target date: 04/26/2022   Ind with initial HEP Baseline:  Goal status: met (03/10/22)      LONG TERM GOALS: Target date: 04/26/2022 (updated 04/03/22)    Pt will be independent with advanced HEP to maintain improvements made throughout therapy   Baseline:  Goal status: ongoing   2.  Pt will report 2 solid BMs per week due to improved muscle tone and coordination with BMs.  Baseline: 1 small this week Goal status: ongoing   3.  Pt will report her BMs are complete due to improved bowel habits and evacuation techniques.  Baseline: improved frequency Goal status: ongoing   4.  Pt will be able to sustain pelvic floor contraction for at least 10 seconds for improved functional trunk support during activities such as lifting Baseline: very minimal change in tone Goal status: Ongoing     PLAN: PT FREQUENCY: 1x/week   PT DURATION: 12 weeks   PLANNED INTERVENTIONS:  Therapeutic exercises, Therapeutic activity, Neuromuscular re-education, Balance training, Gait training, Patient/Family education, Joint mobilization, Dry Needling, Electrical stimulation, Cryotherapy, Moist heat, Taping, Biofeedback, and Manual therapy   PLAN FOR NEXT SESSION: add to Access Code: [facility]; f/u on rectal dilators; internal STM if tolerated for coccyx mobs; add coccyx mobs qped and seated, basic core strengthening as able     Jule Ser, PT 04/03/2022, 5:36 PM

## 2022-04-04 NOTE — Telephone Encounter (Signed)
I called patient and informed her that Dr Margarita Rana can refer her to the the genetics clinic if she would like for genetic testing for Ehlers-Danlos syndrome and she will discuss her concern about thoracic outlet syndrome at her upcoming visit.  The patient was very pleased and would appreciate the referral for genetics testing. She said she was referred to [facility] for testing in the past and they no longer test for Ehlers-Danlos.   She also wants to thank Dr Margarita Rana for referring her for aquatic therapy and stated that is is much better than doing the therapy on land.  She inquired about receiving approval for additional aquatic therapy visits before the initial 12 visits that have been approved are completed and I instructed her to discuss that with her therapist.

## 2022-04-05 ENCOUNTER — Encounter (HOSPITAL_BASED_OUTPATIENT_CLINIC_OR_DEPARTMENT_OTHER): Payer: Self-pay | Admitting: Physical Therapy

## 2022-04-05 ENCOUNTER — Ambulatory Visit (HOSPITAL_BASED_OUTPATIENT_CLINIC_OR_DEPARTMENT_OTHER): Payer: Medicaid Other | Admitting: Physical Therapy

## 2022-04-05 DIAGNOSIS — M6281 Muscle weakness (generalized): Secondary | ICD-10-CM

## 2022-04-05 DIAGNOSIS — R262 Difficulty in walking, not elsewhere classified: Secondary | ICD-10-CM | POA: Diagnosis not present

## 2022-04-05 DIAGNOSIS — R279 Unspecified lack of coordination: Secondary | ICD-10-CM | POA: Diagnosis not present

## 2022-04-05 DIAGNOSIS — M791 Myalgia, unspecified site: Secondary | ICD-10-CM

## 2022-04-05 DIAGNOSIS — M62838 Other muscle spasm: Secondary | ICD-10-CM

## 2022-04-05 NOTE — Therapy (Signed)
OUTPATIENT PHYSICAL THERAPY TREATMENT NOTE   Patient Name: Rachel [name] MRN: [id number] DOB:1986/06/15, 36 y.o., female Today's Date: 04/05/2022   END OF SESSION:   PT End of Session - 04/05/22 0858     Visit Number 9    Number of Visits 12    Date for PT Re-Evaluation 04/26/22    Authorization Type medicaid healthy blue    PT Start Time 0901    PT Stop Time 0945    PT Time Calculation (min) 44 min    Activity Tolerance Patient limited by pain;Patient tolerated treatment well    Behavior During Therapy [facility] for tasks assessed/performed             Past Medical History:  Diagnosis Date   ADHD (attention deficit hyperactivity disorder)    Anemia    Anxiety    Asthma    Bipolar disorder ([place])    Complication of anesthesia    slow to wake up    Delayed gastric emptying 02/23/2020   Depression    DUB (dysfunctional uterine bleeding) 02/23/2020   Ehlers-Danlos syndrome    Endometriosis    Gastroparesis    GERD (gastroesophageal reflux disease)    Heart murmur    Hiatal hernia 04/06/2020   Myalgia 04/06/2020   Parasitic infection 02/23/2020   Pneumonia    Poor appetite 02/23/2020   RUQ pain 02/23/2020   Vitamin D deficiency    Weight loss 02/23/2020   Past Surgical History:  Procedure Laterality Date   barium study     COLONOSCOPY     CYSTOSCOPY N/A 07/12/2021   Procedure: CYSTOSCOPY;  Surgeon: [name], MD;  Location: [place];  Service: Gynecology;  Laterality: N/A;   ESOPHAGOGASTRODUODENOSCOPY  2011   and a ph study as well.    LAPAROSCOPY N/A 01/26/2021   Procedure: LAPAROSCOPY DIAGNOSTIC;  Surgeon: [name], MD;  Location: [place];  Service: Gynecology;  Laterality: N/A;   NASAL SEPTUM SURGERY     TONGUE FLAP     TOTAL LAPAROSCOPIC HYSTERECTOMY WITH SALPINGECTOMY Bilateral 07/12/2021   Procedure: TOTAL LAPAROSCOPIC HYSTERECTOMY WITH SALPINGECTOMY, OOPHORECTOMY;  Surgeon: [name], MD;  Location: [place];   Service: Gynecology;  Laterality: Bilateral;   UPPER GASTROINTESTINAL ENDOSCOPY     WISDOM TOOTH EXTRACTION     Patient Active Problem List   Diagnosis Date Noted   Iron deficiency anemia 01/20/2022   Acute hyponatremia 01/18/2022   Pyelonephritis 01/18/2022   Protein-calorie malnutrition, severe 01/18/2022   sepsis secondary to pyelonephritis of left kidney 01/17/2022   Sepsis ([place]) 01/17/2022   Chronic abdominal pain 01/17/2022   Dyspnea 01/17/2022   Underweight 01/17/2022   Irritable bowel syndrome (IBS) 01/17/2022   Hypokalemia 01/17/2022   Vasomotor symptoms due to menopause 08/17/2021   S/P laparoscopic hysterectomy 07/12/2021   Preoperative exam for gynecologic surgery 06/20/2021   Adverse drug reaction 06/01/2021   Draining postoperative wound 02/28/2021   Encounter for postoperative care 02/16/2021   Endometriosis determined by laparoscopy    Pelvic pain in female 10/11/2020   Presence of subdermal contraceptive implant 10/11/2020   Dysmenorrhea 10/11/2020   Dyspareunia in female 10/11/2020   NSVT (nonsustained ventricular tachycardia) ([place]) 09/03/2020   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   Palpitations 07/26/2020   Family history of connective tissue disease 07/26/2020   Myalgia 04/06/2020   Hiatal hernia 04/06/2020   Parasitic infection 02/23/2020   Weight loss 02/23/2020   Poor appetite 02/23/2020   RUQ pain 02/23/2020   Delayed gastric  emptying 02/23/2020   DUB (dysfunctional uterine bleeding) 02/23/2020   Cervical radiculopathy 05/31/2018   Hyperalgesia 05/31/2018   Acid reflux 12/26/2017   ADHD (attention deficit hyperactivity disorder) 05/07/2012   Bipolar 1 disorder ([place]) 05/07/2012    RPCP: Charlott Rakes, MD   REFERRING PROVIDER: Charlott Rakes, MD   REFERRING DIAG: M79.10 (ICD-10-CM) - Myalgia   THERAPY DIAG:  Myalgia   Muscle weakness (generalized)   Difficulty in walking, not elsewhere classified   ONSET DATE: >3yr   SUBJECTIVE:                                                                                                                                                                                             SUBJECTIVE STATEMENT: "I was tired after last aquatic session but it was good.  My right hip is hurting some today.  I really think the pool therapy is going to help me" PERTINENT HISTORY:  Irritable bowel syndrome with both constipation and diarrhea          Moderate protein-calorie malnutrition bipolar disorder, anxiety, depression, GERD, IBS with constipation, status post laparoscopic TAHBSO due to abnormal uterine bleed    currently on Premarin by GYN) She was hospitalized for pyelonephritis from 01/17/2022 through 01/21/2022. PAIN:  Are you having pain? Yes: NPRS scale: 6/10 left hip Pain location: cerv spine, left shoulder and arm, LB, hips Pain description: ache Aggravating factors: overdoing although tolerance levels vary Relieving factors: rest     PRECAUTIONS: Other[facility]   WEIGHT BEARING RESTRICTIONS No   FALLS:  Has patient fallen in last 6 months? No   LIVING ENVIRONMENT: Lives with: lives with their family Lives in: House/apartment Stairs: Yes: Internal: 1 flight steps; yes     OCCUPATION: unable to work   PLOF: Independent with basic ADLs   PATIENT GOALS gain strength, decrease pain     OBJECTIVE:    COGNITION:           Overall cognitive status:  WFL although pt reports bouts of brain fog                                  SENSATION: Numbness and tingling LUE into cervical spine intermittent Occasional numbness and tingling in LE   MUSCLE LENGTH: Connective tissue disorder probable/hypermobile   POSTURE:  Very slight figure, slightly rounded shoulders       ROM throughout wfl Strength UE 3+/5 LE 3/5     FUNCTIONAL TESTS:  5 times sit to stand: 38 Timed up and  go (TUG): 18 Berg Balance Scale: 21/56   GAIT: Distance  walked: 300 Assistive device utilized: None Level of assistance: Complete Independence Comments: decreased stride length. Upon initiation of gait cadence slowed       TODAY'S TREATMENT  Pt seen for aquatic therapy today.  Treatment took place in water 3.25-4.8 ft in depth at the [employer] [facility] pool. Temp of water was 91.  Pt entered/exited the pool via stairs step through pattern independently with bilat rail.  Walking with and without ue (yellow hand buoys) support forward, back and sidestepping x4 widths each -seated rest period  Standing in 4 ft 1/4 noodle (noodle that is cut) pull down to knees 2x10  Standing 3 ft: df;pf; marching; hip openers; hurdles x7-10 -seated rest period after  Seated on 3 step Flutter 20x3 Add/abd x20   Straddling noodle cycling; add/abd; scissor     Pt requires buoyancy for support and to offload joints with strengthening exercises. Viscosity of the water is needed for resistance of strengthening; water current perturbations provides challenge to standing balance unsupported, requiring increased core activation.      PATIENT EDUCATION:  Education details: Propertires of water, benefit of aquatic therapy Person educated:  pt and mother Education method: Explanation Education comprehension: verbalized understanding     HOME EXERCISE PROGRAM: Aquatic HEP to be assigned   ASSESSMENT:   CLINICAL IMPRESSION: Pt reports positive response from last session, some fatigue/tiredness but not excessive. Is wanting to have more sessions in pool as she feels she will gain a lot of strength and function exercising submerged.  She is given 2-3 seated rest periods throughout session today. Working on identifying appropriate level of activity to gain strength and improve toleration. She demonstrates good mindfulness and insight with her ability/toleration. Good session.    Patient is a 36 y.o. F who was seen today for physical therapy evaluation and  treatment for myalgia. She is also being seen for pelvic floor rehab at [place] ([place]). Pt has had hx of pain sx potentially due to a connective tissue disorder. She presents frail, slight frame ~ 97lbs.  Gastroparesis inhibiting improved eating patterns.  Pt not working. Good days she may be able to walk for 30 minutes, bad days in bed.  She will benefit from skilled aquatic PT to improve strength, toleration to activity and enhance motility of gut through movement using properties of water to facilitate progression towards goals decreasing external factors.     OBJECTIVE IMPAIRMENTS decreased activity tolerance, decreased balance, decreased endurance, decreased mobility, and decreased strength.    ACTIVITY LIMITATIONS cleaning, community activity, driving, meal prep, occupation, laundry, yard work, and shopping.    PERSONAL FACTORS Age, Behavior pattern, and 1-2 comorbidities: IBS, malnutrition  are also affecting patient's functional outcome.      REHAB POTENTIAL: fair   CLINICAL DECISION MAKING: Evolving/moderate complexity   EVALUATION COMPLEXITY: Moderate     GOALS: Goals reviewed with patient? Yes   SHORT TERM GOALS: Target date: 04/04/2022   Pt will tolerate >30 minute aquatic session with rest periods as needed to demonstrate improved toleration to activity Baseline:Not yet completed Goal status: INITIAL   2.  Pt will be indep with initial aquatic HEP with access to a pool Baseline: no HEP Goal status: INITIAL   3.  Pt will improve on LE strength up 1/2 to 1 full grade Baseline:3/5 Goal status: INITIAL     LONG TERM GOALS: Target date: 05/02/2022   Pt will improve on Berg Balance test to >  or= 35/56 to demonstrate improved balance and decreased fall risk Baseline: 21/56 Goal status: INITIAL   2.  Pt will improve on 5xSTS test to <25s Baseline: 38 Goal status: INITIAL   3.  Pt will gain up to 2lb to improve health by increasing appetite through increased  movement Baseline: 97 lbs Goal status: INITIAL   4.  Pt will be indep with final HEP with knowledge how to adjust according to her day to day ability to tolerate Baseline: no HEP Goal status: INITIAL         PLAN: PT FREQUENCY: 1x/week   PT DURATION: 8 weeks   PLANNED INTERVENTIONS: Therapeutic exercises, Therapeutic activity, Neuromuscular re-education, Balance training, Gait training, Patient/Family education, Joint mobilization, Stair training, Aquatic Therapy, and Taping.   PLAN FOR NEXT SESSION: toleration to last session?? Added adductor sets with buoyancy ball to overlap pelvic floor exercises with strengthening of LE in aquatics  [place] ([place]) Nathalee Smarr MPT 04/05/2022, 12:47 PM

## 2022-04-05 NOTE — Telephone Encounter (Signed)
Message sent to [facility] referral coordinator inquiring where the referral will be sent since the patient said that [facility] and [facility] do not offer the testing she needs.

## 2022-04-06 ENCOUNTER — Ambulatory Visit (INDEPENDENT_AMBULATORY_CARE_PROVIDER_SITE_OTHER): Payer: Medicaid Other | Admitting: Clinical

## 2022-04-06 DIAGNOSIS — Z658 Other specified problems related to psychosocial circumstances: Secondary | ICD-10-CM

## 2022-04-06 DIAGNOSIS — F909 Attention-deficit hyperactivity disorder, unspecified type: Secondary | ICD-10-CM | POA: Diagnosis not present

## 2022-04-06 DIAGNOSIS — F4310 Post-traumatic stress disorder, unspecified: Secondary | ICD-10-CM

## 2022-04-07 NOTE — BH Specialist Note (Addendum)
Integrated Behavioral Health via Telemedicine Visit  04/20/2022 Rachel [name] [id number]  Number of [place] Clinician visits: Additional Visit  Session Start time: 3735   Session End time: 1106  Total time in minutes: 15   Referring Provider: Lynnda Shields, MD Patient/Family location: Home [facility] Provider location: [facility] [place] [facility]  All persons participating in visit: [name] and [place]   Types of Service: Individual psychotherapy and Telephone visit  Patient and/or legal guardian expressed understanding and consented to Telemedicine visit:  No; requests to reschedule due to acute grief  Presenting Concerns: Patient and/or family reports the following symptoms/concerns: Grieving the loss of a friend, found out earlier today.    Caroleen Hamman Faruq Rosenberger, LCSW

## 2022-04-11 NOTE — Telephone Encounter (Signed)
We can refer to [facility].

## 2022-04-13 ENCOUNTER — Ambulatory Visit: Payer: Medicaid Other | Attending: Gastroenterology | Admitting: Physical Therapy

## 2022-04-13 ENCOUNTER — Encounter (HOSPITAL_BASED_OUTPATIENT_CLINIC_OR_DEPARTMENT_OTHER): Payer: Self-pay | Admitting: Physical Therapy

## 2022-04-13 ENCOUNTER — Ambulatory Visit (HOSPITAL_BASED_OUTPATIENT_CLINIC_OR_DEPARTMENT_OTHER): Payer: Medicaid Other | Attending: Family Medicine | Admitting: Physical Therapy

## 2022-04-13 DIAGNOSIS — R262 Difficulty in walking, not elsewhere classified: Secondary | ICD-10-CM | POA: Insufficient documentation

## 2022-04-13 DIAGNOSIS — M62838 Other muscle spasm: Secondary | ICD-10-CM | POA: Insufficient documentation

## 2022-04-13 DIAGNOSIS — M791 Myalgia, unspecified site: Secondary | ICD-10-CM | POA: Insufficient documentation

## 2022-04-13 DIAGNOSIS — M6281 Muscle weakness (generalized): Secondary | ICD-10-CM | POA: Diagnosis not present

## 2022-04-13 DIAGNOSIS — R279 Unspecified lack of coordination: Secondary | ICD-10-CM | POA: Insufficient documentation

## 2022-04-13 NOTE — Therapy (Signed)
OUTPATIENT PHYSICAL THERAPY TREATMENT NOTE   Patient Name: Rachel [name] MRN: [id number] DOB:1986/10/10, 36 y.o., female Today's Date: 04/15/2022  PCP: Charlott Rakes, MD REFERRING PROVIDER: Mauri Pole, MD  END OF SESSION:   PT End of Session - 04/15/22 1809     Visit Number 11   8/12 pelvic   Number of Visits 12    Date for PT Re-Evaluation 04/26/22    Authorization Type medicaid healthy blue    PT Start Time 1619    PT Stop Time 1700    PT Time Calculation (min) 41 min    Activity Tolerance Patient limited by pain;Patient tolerated treatment well    Behavior During Therapy [facility] for tasks assessed/performed                  Past Medical History:  Diagnosis Date   ADHD (attention deficit hyperactivity disorder)    Anemia    Anxiety    Asthma    Bipolar disorder ([place])    Complication of anesthesia    slow to wake up    Delayed gastric emptying 02/23/2020   Depression    DUB (dysfunctional uterine bleeding) 02/23/2020   Ehlers-Danlos syndrome    Endometriosis    Gastroparesis    GERD (gastroesophageal reflux disease)    Heart murmur    Hiatal hernia 04/06/2020   Myalgia 04/06/2020   Parasitic infection 02/23/2020   Pneumonia    Poor appetite 02/23/2020   RUQ pain 02/23/2020   Vitamin D deficiency    Weight loss 02/23/2020   Past Surgical History:  Procedure Laterality Date   barium study     COLONOSCOPY     CYSTOSCOPY N/A 07/12/2021   Procedure: CYSTOSCOPY;  Surgeon: [name], MD;  Location: [place];  Service: Gynecology;  Laterality: N/A;   ESOPHAGOGASTRODUODENOSCOPY  2011   and a ph study as well.    LAPAROSCOPY N/A 01/26/2021   Procedure: LAPAROSCOPY DIAGNOSTIC;  Surgeon: [name], MD;  Location: [place];  Service: Gynecology;  Laterality: N/A;   NASAL SEPTUM SURGERY     TONGUE FLAP     TOTAL LAPAROSCOPIC HYSTERECTOMY WITH SALPINGECTOMY Bilateral 07/12/2021   Procedure: TOTAL LAPAROSCOPIC  HYSTERECTOMY WITH SALPINGECTOMY, OOPHORECTOMY;  Surgeon: [name], MD;  Location: [place];  Service: Gynecology;  Laterality: Bilateral;   UPPER GASTROINTESTINAL ENDOSCOPY     WISDOM TOOTH EXTRACTION     Patient Active Problem List   Diagnosis Date Noted   Iron deficiency anemia 01/20/2022   Acute hyponatremia 01/18/2022   Pyelonephritis 01/18/2022   Protein-calorie malnutrition, severe 01/18/2022   sepsis secondary to pyelonephritis of left kidney 01/17/2022   Sepsis ([place]) 01/17/2022   Chronic abdominal pain 01/17/2022   Dyspnea 01/17/2022   Underweight 01/17/2022   Irritable bowel syndrome (IBS) 01/17/2022   Hypokalemia 01/17/2022   Vasomotor symptoms due to menopause 08/17/2021   S/P laparoscopic hysterectomy 07/12/2021   Preoperative exam for gynecologic surgery 06/20/2021   Adverse drug reaction 06/01/2021   Draining postoperative wound 02/28/2021   Encounter for postoperative care 02/16/2021   Endometriosis determined by laparoscopy    Pelvic pain in female 10/11/2020   Presence of subdermal contraceptive implant 10/11/2020   Dysmenorrhea 10/11/2020   Dyspareunia in female 10/11/2020   NSVT (nonsustained ventricular tachycardia) ([place]) 09/03/2020   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   Palpitations 07/26/2020   Family history of connective tissue disease 07/26/2020   Myalgia 04/06/2020   Hiatal hernia 04/06/2020   Parasitic infection 02/23/2020  Weight loss 02/23/2020   Poor appetite 02/23/2020   RUQ pain 02/23/2020   Delayed gastric emptying 02/23/2020   DUB (dysfunctional uterine bleeding) 02/23/2020   Cervical radiculopathy 05/31/2018   Hyperalgesia 05/31/2018   Acid reflux 12/26/2017   ADHD (attention deficit hyperactivity disorder) 05/07/2012   Bipolar 1 disorder ([place]) 05/07/2012    REFERRING DIAG:  K59.04 (ICD-10-CM) - Chronic idiopathic constipation  K58.1 (ICD-10-CM) - Irritable bowel syndrome with constipation  M62.89 (ICD-10-CM) - Pelvic floor  dysfunction      THERAPY DIAG:  Muscle weakness (generalized)  Unspecified lack of coordination  Other muscle spasm  PERTINENT HISTORY: hysterectomy, myalgia, bipolar  PRECAUTIONS: None  SUBJECTIVE: Pt reports she had full incontinence of feces the other day.  PAIN:  Are you having pain? Yes NPRS scale: 5/10 Pain location: abdomen Pain orientation: Other: general   PAIN TYPE: pressure and gnawing and bloating Pain description: intermittent  Aggravating factors: being backed up Relieving factors: lying down is better   LE ROM:                         Hypermobilty bil hips   LE MMT: Hip adduction 4/5 bil; ext 4/5 bil   PELVIC MMT:   MMT   02/01/2022  Vaginal    Internal Anal Sphincter    External Anal Sphincter    Puborectalis    Diastasis Recti    1/5 MMT rectal assessment with 2 sec hold; very slow to contract and relax only does 1 rep in 10 seconds         PALPATION:                   External Perineal Exam high tone, flexed coccyx                             Internal Pelvic Floor see above and very TTP with light touch, unable to palpate beyond puborectalis   TONE: High with weakness       TODAY'S TREATMENT   Treatment: 04/13/22 Patient confirms identification and approves physical therapist to perform internal soft tissue work  Manual Internal to anal sphincters and pubrectalis external to coccyx Abdominal release and mobility of ascending, transverse colon Lumbar and thoracic paraspinals Trigger Point Dry-Needling  Treatment instructions: Expect mild to moderate muscle soreness. S/S of pneumothorax if dry needled over a lung field, and to seek immediate medical attention should they occur. Patient verbalized understanding of these instructions and education.  Patient Consent Given: Yes Education handout provided: Previously provided Muscles treated: lumbar and thoracic multifidi Electrical stimulation performed: No Parameters: N/A Treatment  response/outcome: increased soft tissue length palpated  Treatment: 04/03/22 Patient confirms identification and approves physical therapist to perform internal soft tissue work  Manual Internal to anal sphincters and pubrectalis external to coccyx Abdominal release and mobility of ascending, transverse colon Lumbar and thoracic paraspinals Trigger Point Dry-Needling  Treatment instructions: Expect mild to moderate muscle soreness. S/S of pneumothorax if dry needled over a lung field, and to seek immediate medical attention should they occur. Patient verbalized understanding of these instructions and education.  Patient Consent Given: Yes Education handout provided: Previously provided Muscles treated: lumbar and thoracic multifidi Electrical stimulation performed: No Parameters: N/A Treatment response/outcome: increased soft tissue length palpated  Treatment: 03/20/22  Manual Abdominal release and mobility of ascending, descending colon, stomach Lumbar and thoracic paraspinals  Trigger Point Dry-Needling  Treatment instructions:  Expect mild to moderate muscle soreness. S/S of pneumothorax if dry needled over a lung field, and to seek immediate medical attention should they occur. Patient verbalized understanding of these instructions and education.  Patient Consent Given: Yes Education handout provided: Previously provided Muscles treated: lumbar and thoracic multifidi Electrical stimulation performed: No Parameters: N/A Treatment response/outcome: increased soft tissue length palpated     PATIENT EDUCATION:  Education details: dilators and initial HEP Person educated: Patient and Armed forces training and education officer method: Explanation, Demonstration, Tactile cues, Verbal cues, and Handouts Education comprehension: verbalized understanding and returned demonstration     HOME EXERCISE PROGRAM:   Access Code: [facility] URL: [email or web address] Date: 02/01/2022 Prepared by:  Jari Favre   Exercises - Ball squeeze with Kegel  - 3 x daily - 7 x weekly - 1 sets - 10 reps - 3 sec hold     ASSESSMENT:   CLINICAL IMPRESSION: Today's session focused on internal soft tissue mobilization and lengthening of sphincter and puborectalis muscles.  Pt very TTP to the left posterior especially.  Pt has been having more bowel movement but is now having harder time controlling bowel movements.  Pt is still working on getting dilators in order to work on consistently lengthening the pelvic floor muscles so that she can work on healthier muscle tissues.  Pt is making progress slowly due to chronic condition and she is expected to continue to make progress with skilled PT.       OBJECTIVE IMPAIRMENTS decreased coordination, decreased endurance, decreased strength, and impaired tone.    ACTIVITY LIMITATIONS community activity and toileting  .    PERSONAL FACTORS Past/current experiences, Time since onset of injury/illness/exacerbation, and 3+ comorbidities: slow transit bowels, myalgia, IBS  are also affecting patient's functional outcome.      REHAB POTENTIAL: Good   CLINICAL DECISION MAKING: Unstable/unpredictable   EVALUATION COMPLEXITY: Moderate     GOALS: Goals reviewed with patient? Yes   SHORT TERM GOALS: Target date: 04/26/2022   Ind with initial HEP Baseline:  Goal status: met (03/10/22)      LONG TERM GOALS: Target date: 04/26/2022 (updated 04/03/22)    Pt will be independent with advanced HEP to maintain improvements made throughout therapy   Baseline:  Goal status: ongoing   2.  Pt will report 2 solid BMs per week due to improved muscle tone and coordination with BMs.  Baseline: 1 small this week Goal status: ongoing   3.  Pt will report her BMs are complete due to improved bowel habits and evacuation techniques.  Baseline: improved frequency Goal status: ongoing   4.  Pt will be able to sustain pelvic floor contraction for at least 10  seconds for improved functional trunk support during activities such as lifting Baseline: very minimal change in tone Goal status: Ongoing     PLAN: PT FREQUENCY: 1x/week   PT DURATION: 12 weeks   PLANNED INTERVENTIONS: Therapeutic exercises, Therapeutic activity, Neuromuscular re-education, Balance training, Gait training, Patient/Family education, Joint mobilization, Dry Needling, Electrical stimulation, Cryotherapy, Moist heat, Taping, Biofeedback, and Manual therapy   PLAN FOR NEXT SESSION: add to Access Code: [email or web address]; f/u on rectal dilators; internal STM if tolerated for coccyx mobs; add coccyx mobs qped and seated, basic core strengthening as able     Jule Ser, PT 04/15/2022, 6:13 PM

## 2022-04-13 NOTE — Therapy (Signed)
OUTPATIENT PHYSICAL THERAPY TREATMENT NOTE   Patient Name: Shandricka Monroy MRN: [id number] DOB:12/30/1985, 36 y.o., female Today's Date: 04/13/2022   END OF SESSION:   PT End of Session - 04/13/22 1037     Visit Number 10    Number of Visits 12    Date for PT Re-Evaluation 04/26/22    Authorization Type medicaid healthy blue    PT Start Time 1031    PT Stop Time 1115    PT Time Calculation (min) 44 min    Activity Tolerance Patient limited by pain;Patient tolerated treatment well    Behavior During Therapy [facility] for tasks assessed/performed             Past Medical History:  Diagnosis Date   ADHD (attention deficit hyperactivity disorder)    Anemia    Anxiety    Asthma    Bipolar disorder ([place])    Complication of anesthesia    slow to wake up    Delayed gastric emptying 02/23/2020   Depression    DUB (dysfunctional uterine bleeding) 02/23/2020   Ehlers-Danlos syndrome    Endometriosis    Gastroparesis    GERD (gastroesophageal reflux disease)    Heart murmur    Hiatal hernia 04/06/2020   Myalgia 04/06/2020   Parasitic infection 02/23/2020   Pneumonia    Poor appetite 02/23/2020   RUQ pain 02/23/2020   Vitamin D deficiency    Weight loss 02/23/2020   Past Surgical History:  Procedure Laterality Date   barium study     COLONOSCOPY     CYSTOSCOPY N/A 07/12/2021   Procedure: CYSTOSCOPY;  Surgeon: [name], MD;  Location: [place];  Service: Gynecology;  Laterality: N/A;   ESOPHAGOGASTRODUODENOSCOPY  2011   and a ph study as well.    LAPAROSCOPY N/A 01/26/2021   Procedure: LAPAROSCOPY DIAGNOSTIC;  Surgeon: [name], MD;  Location: [place];  Service: Gynecology;  Laterality: N/A;   NASAL SEPTUM SURGERY     TONGUE FLAP     TOTAL LAPAROSCOPIC HYSTERECTOMY WITH SALPINGECTOMY Bilateral 07/12/2021   Procedure: TOTAL LAPAROSCOPIC HYSTERECTOMY WITH SALPINGECTOMY, OOPHORECTOMY;  Surgeon: [name], MD;  Location: [place];   Service: Gynecology;  Laterality: Bilateral;   UPPER GASTROINTESTINAL ENDOSCOPY     WISDOM TOOTH EXTRACTION     Patient Active Problem List   Diagnosis Date Noted   Iron deficiency anemia 01/20/2022   Acute hyponatremia 01/18/2022   Pyelonephritis 01/18/2022   Protein-calorie malnutrition, severe 01/18/2022   sepsis secondary to pyelonephritis of left kidney 01/17/2022   Sepsis ([place]) 01/17/2022   Chronic abdominal pain 01/17/2022   Dyspnea 01/17/2022   Underweight 01/17/2022   Irritable bowel syndrome (IBS) 01/17/2022   Hypokalemia 01/17/2022   Vasomotor symptoms due to menopause 08/17/2021   S/P laparoscopic hysterectomy 07/12/2021   Preoperative exam for gynecologic surgery 06/20/2021   Adverse drug reaction 06/01/2021   Draining postoperative wound 02/28/2021   Encounter for postoperative care 02/16/2021   Endometriosis determined by laparoscopy    Pelvic pain in female 10/11/2020   Presence of subdermal contraceptive implant 10/11/2020   Dysmenorrhea 10/11/2020   Dyspareunia in female 10/11/2020   NSVT (nonsustained ventricular tachycardia) ([place]) 09/03/2020   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   Palpitations 07/26/2020   Family history of connective tissue disease 07/26/2020   Myalgia 04/06/2020   Hiatal hernia 04/06/2020   Parasitic infection 02/23/2020   Weight loss 02/23/2020   Poor appetite 02/23/2020   RUQ pain 02/23/2020   Delayed gastric  emptying 02/23/2020   DUB (dysfunctional uterine bleeding) 02/23/2020   Cervical radiculopathy 05/31/2018   Hyperalgesia 05/31/2018   Acid reflux 12/26/2017   ADHD (attention deficit hyperactivity disorder) 05/07/2012   Bipolar 1 disorder ([place]) 05/07/2012    RPCP: Charlott Rakes, MD   REFERRING PROVIDER: Charlott Rakes, MD   REFERRING DIAG: M79.10 (ICD-10-CM) - Myalgia   THERAPY DIAG:  Myalgia   Muscle weakness (generalized)   Difficulty in walking, not elsewhere classified   ONSET DATE: >46yr   SUBJECTIVE:                                                                                                                                                                                             SUBJECTIVE STATEMENT: "I have another appointment today so I know I will be tired."  "I can already tell this is helping me" PERTINENT HISTORY:  Irritable bowel syndrome with both constipation and diarrhea          Moderate protein-calorie malnutrition bipolar disorder, anxiety, depression, GERD, IBS with constipation, status post laparoscopic TAHBSO due to abnormal uterine bleed    currently on Premarin by GYN) She was hospitalized for pyelonephritis from 01/17/2022 through 01/21/2022. PAIN:  Are you having pain? Yes: NPRS scale: 6/10 left hip Pain location: cerv spine, left shoulder and arm, LB, hips Pain description: ache Aggravating factors: overdoing although tolerance levels vary Relieving factors: rest     PRECAUTIONS: Other: being tested [facility]/hypermobile joints   WEIGHT BEARING RESTRICTIONS No   FALLS:  Has patient fallen in last 6 months? 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 Upon initiation of gait cadence slowed       TODAY'S TREATMENT  Modified treatment today to not over exert as she has another appintmtnet later today.  Pt seen for aquatic therapy today.  Treatment took place in water 3.25-4.8 ft in depth at the [facility] pool. Temp of water was 91.  Pt entered/exited the pool via stairs step through pattern independently with bilat rail.  Walking with and without ue (yellow hand buoys) support forward, back and sidestepping x4 widths each -seated rest period  Seated on 3 step Flutter 20x3 Add/abd x20   Standing in 4 ft 1/4 noodle (noodle that is cut) pull down to knees 2x10  Plank on bench with hip extension 2x8. Cues for glut contraction with extension Plank with blue noodle 3x 30s hold. VC for execution, abdominal bracing and glut iso contraction  Straddling noodle cycling    Pt requires buoyancy for support and to offload joints with strengthening exercises. Viscosity of the water is needed for resistance of strengthening; water current perturbations provides challenge to standing balance unsupported, requiring increased core activation.      PATIENT EDUCATION:  Education details: Propertires of water, benefit of aquatic therapy Person educated:  pt and mother Education method: Explanation Education comprehension: verbalized understanding     HOME EXERCISE PROGRAM: Aquatic HEP to be assigned   ASSESSMENT:   CLINICAL IMPRESSION: Modified sessions intensity due to another appointment which pt has scheduled for later today to ensure energy reserve.  Added core strengthening using water pilates plank which she tolerates and executes well.  Last trial she does fatigue demonstrating increased difficulty maintaining position. Will submit for more medicaid approval.  Goals ongoing.     Patient is a 36 y.o. F who was seen today for physical  therapy evaluation and treatment for myalgia. She is also being seen for pelvic floor rehab at [place] ([facility]). Pt has had hx of pain sx potentially due to a connective tissue disorder. She presents frail, slight frame ~ 97lbs.  Gastroparesis inhibiting improved eating patterns.  Pt not working. Good days she may be able to walk for 30 minutes, bad days in bed.  She will benefit from skilled aquatic PT to improve strength, toleration to activity and enhance motility of gut through movement using properties of water to facilitate progression towards goals decreasing external factors.     OBJECTIVE IMPAIRMENTS decreased activity tolerance, decreased balance, decreased endurance, decreased mobility, and decreased strength.    ACTIVITY LIMITATIONS cleaning, community activity, driving, meal prep, occupation, laundry, yard work, and shopping.    PERSONAL FACTORS Age, Behavior pattern, and 1-2 comorbidities: IBS, malnutrition  are also affecting patient's functional outcome.      REHAB POTENTIAL: fair   CLINICAL DECISION MAKING: Evolving/moderate complexity   EVALUATION COMPLEXITY: Moderate     GOALS: Goals reviewed with patient? Yes   SHORT TERM GOALS: Target date: 04/04/2022   Pt will tolerate >30 minute aquatic session with rest periods as needed to demonstrate improved toleration to activity Baseline:Not yet completed Goal status: Achieved   2.  Pt will be indep with initial aquatic HEP with access to a pool Baseline: no HEP Goal status: ongoing no access to outside pool yet   3.  Pt will improve on LE strength up 1/2 to 1 full grade Baseline:3/5 Goal status: INITIAL     LONG TERM GOALS: Target date: 05/02/2022  Pt will improve on Berg Balance test to >or= 35/56 to demonstrate improved balance and decreased fall risk Baseline: 21/56 Goal status: INITIAL   2.  Pt will improve on 5xSTS test to <25s Baseline: 38 Goal status: INITIAL   3.  Pt will gain up to 2lb to improve  health by increasing appetite through increased movement Baseline: 97 lbs Goal status: INITIAL   4.  Pt will be indep with final HEP with knowledge how to adjust according to her day to day ability to tolerate Baseline: no HEP Goal status: INITIAL         PLAN: PT FREQUENCY: 1x/week   PT DURATION: 8 weeks   PLANNED INTERVENTIONS: Therapeutic exercises, Therapeutic activity, Neuromuscular re-education, Balance training, Gait training, Patient/Family education, Joint mobilization, Stair training, Aquatic Therapy, and Taping.   PLAN FOR NEXT SESSION: tAdd adductor sets with buoyancy ball to overlap pelvic floor exercises with strengthening of LE in aquatics  Alicyn Klann (Frankie) Malvin Morrish MPT 04/13/2022, 10:39 AM   Check all possible CPT codes: 84166- Therapeutic Exercise, [phone]- Neuro Re-education, and [phone] - Aquatic therapy     If treatment provided at initial evaluation, no treatment charged due to lack of authorization.

## 2022-04-17 ENCOUNTER — Encounter: Payer: Self-pay | Admitting: Physical Therapy

## 2022-04-17 ENCOUNTER — Ambulatory Visit: Payer: Medicaid Other | Admitting: Physical Therapy

## 2022-04-17 DIAGNOSIS — R279 Unspecified lack of coordination: Secondary | ICD-10-CM

## 2022-04-17 DIAGNOSIS — M6281 Muscle weakness (generalized): Secondary | ICD-10-CM | POA: Diagnosis not present

## 2022-04-17 DIAGNOSIS — M62838 Other muscle spasm: Secondary | ICD-10-CM | POA: Diagnosis not present

## 2022-04-17 NOTE — Therapy (Signed)
OUTPATIENT PHYSICAL THERAPY TREATMENT NOTE   Patient Name: Rachel [name] MRN: [id number] DOB:10/22/1986, 36 y.o., female Today's Date: 04/17/2022  PCP: Charlott Rakes, MD REFERRING PROVIDER: Mauri Pole, MD  END OF SESSION:   PT End of Session - 04/17/22 1618     Visit Number 12   9/12 pelvic   Number of Visits 12    Date for PT Re-Evaluation 04/26/22    Authorization Type medicaid healthy blue    PT Start Time 1618    PT Stop Time 1705    PT Time Calculation (min) 47 min    Activity Tolerance Patient tolerated treatment well    Behavior During Therapy WFL for tasks assessed/performed                   Past Medical History:  Diagnosis Date   ADHD (attention deficit hyperactivity disorder)    Anemia    Anxiety    Asthma    Bipolar disorder ([place])    Complication of anesthesia    slow to wake up    Delayed gastric emptying 02/23/2020   Depression    DUB (dysfunctional uterine bleeding) 02/23/2020   Ehlers-Danlos syndrome    Endometriosis    Gastroparesis    GERD (gastroesophageal reflux disease)    Heart murmur    Hiatal hernia 04/06/2020   Myalgia 04/06/2020   Parasitic infection 02/23/2020   Pneumonia    Poor appetite 02/23/2020   RUQ pain 02/23/2020   Vitamin D deficiency    Weight loss 02/23/2020   Past Surgical History:  Procedure Laterality Date   barium study     COLONOSCOPY     CYSTOSCOPY N/A 07/12/2021   Procedure: CYSTOSCOPY;  Surgeon: [name], MD;  Location: [place];  Service: Gynecology;  Laterality: N/A;   ESOPHAGOGASTRODUODENOSCOPY  2011   and a ph study as well.    LAPAROSCOPY N/A 01/26/2021   Procedure: LAPAROSCOPY DIAGNOSTIC;  Surgeon: [name], MD;  Location: [place];  Service: Gynecology;  Laterality: N/A;   NASAL SEPTUM SURGERY     TONGUE FLAP     TOTAL LAPAROSCOPIC HYSTERECTOMY WITH SALPINGECTOMY Bilateral 07/12/2021   Procedure: TOTAL LAPAROSCOPIC HYSTERECTOMY WITH SALPINGECTOMY,  OOPHORECTOMY;  Surgeon: [name], MD;  Location: [place];  Service: Gynecology;  Laterality: Bilateral;   UPPER GASTROINTESTINAL ENDOSCOPY     WISDOM TOOTH EXTRACTION     Patient Active Problem List   Diagnosis Date Noted   Iron deficiency anemia 01/20/2022   Acute hyponatremia 01/18/2022   Pyelonephritis 01/18/2022   Protein-calorie malnutrition, severe 01/18/2022   sepsis secondary to pyelonephritis of left kidney 01/17/2022   Sepsis ([place]) 01/17/2022   Chronic abdominal pain 01/17/2022   Dyspnea 01/17/2022   Underweight 01/17/2022   Irritable bowel syndrome (IBS) 01/17/2022   Hypokalemia 01/17/2022   Vasomotor symptoms due to menopause 08/17/2021   S/P laparoscopic hysterectomy 07/12/2021   Preoperative exam for gynecologic surgery 06/20/2021   Adverse drug reaction 06/01/2021   Draining postoperative wound 02/28/2021   Encounter for postoperative care 02/16/2021   Endometriosis determined by laparoscopy    Pelvic pain in female 10/11/2020   Presence of subdermal contraceptive implant 10/11/2020   Dysmenorrhea 10/11/2020   Dyspareunia in female 10/11/2020   NSVT (nonsustained ventricular tachycardia) ([place]) 09/03/2020   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   Palpitations 07/26/2020   Family history of connective tissue disease 07/26/2020   Myalgia 04/06/2020   Hiatal hernia 04/06/2020   Parasitic infection 02/23/2020   Weight  loss 02/23/2020   Poor appetite 02/23/2020   RUQ pain 02/23/2020   Delayed gastric emptying 02/23/2020   DUB (dysfunctional uterine bleeding) 02/23/2020   Cervical radiculopathy 05/31/2018   Hyperalgesia 05/31/2018   Acid reflux 12/26/2017   ADHD (attention deficit hyperactivity disorder) 05/07/2012   Bipolar 1 disorder ([place]) 05/07/2012    REFERRING DIAG:  K59.04 (ICD-10-CM) - Chronic idiopathic constipation  K58.1 (ICD-10-CM) - Irritable bowel syndrome with constipation  M62.89 (ICD-10-CM) - Pelvic floor dysfunction      THERAPY DIAG:   Unspecified lack of coordination  Other muscle spasm  Muscle weakness (generalized)  PERTINENT HISTORY: hysterectomy, myalgia, bipolar  PRECAUTIONS: None  SUBJECTIVE: Pt reports no incontinence and still going more regularly, but watery and pellets are coming out.  Nothing normal  PAIN:  Are you having pain? Yes NPRS scale: 5/10 Pain location: abdomen Pain orientation: Other: general   PAIN TYPE: pressure and gnawing and bloating Pain description: intermittent  Aggravating factors: being backed up Relieving factors: lying down is better   LE ROM:                         Hypermobilty bil hips   LE MMT: 04/17/22  Hip adduction 4/5 bil; ext 4/5 bil   PELVIC MMT:   MMT   04/17/22   Vaginal    Internal Anal Sphincter  1/5  External Anal Sphincter 1/5   Puborectalis 1/5   Diastasis Recti    1/5 MMT rectal assessment with 2 sec hold; very slow to contract and relax only does 1 rep in 10 seconds         PALPATION:                   External Perineal Exam high tone, flexed coccyx                             Internal Pelvic Floor see above and very TTP with light touch,  palpate to puborectalis and coccyx; decreased coordination and no bulging with attempt to do gentle push and evacuate   TONE: High with weakness       TODAY'S TREATMENT  Treatment: 04/17/22 Patient confirms identification and approves physical therapist to perform internal soft tissue work  Manual Internal to anal sphincters and pubrectalis external to coccyx Exercises: squat with breathing and rocking Peanut ball rolling and hip shift Self care: educated in using tampon applicator to stretch pelvic floor muscles insert and pressing with breath work and feeling for msucle release Treatment: 04/13/22 Patient confirms identification and approves physical therapist to perform internal soft tissue work  Manual Internal to anal sphincters and pubrectalis external to coccyx Abdominal release and  mobility of ascending, transverse colon Lumbar and thoracic paraspinals Trigger Point Dry-Needling  Treatment instructions: Expect mild to moderate muscle soreness. S/S of pneumothorax if dry needled over a lung field, and to seek immediate medical attention should they occur. Patient verbalized understanding of these instructions and education.  Patient Consent Given: Yes Education handout provided: Previously provided Muscles treated: lumbar and thoracic multifidi Electrical stimulation performed: No Parameters: N/A Treatment response/outcome: increased soft tissue length palpated Self care: how to use tampon applicator to stretch and release rectal muscles Neuro re-ed: breathing and relaxing; gentle pushing  Treatment: 04/03/22 Patient confirms identification and approves physical therapist to perform internal soft tissue work  Manual Internal to anal sphincters and pubrectalis external to coccyx Abdominal  release and mobility of ascending, transverse colon Lumbar and thoracic paraspinals Trigger Point Dry-Needling  Treatment instructions: Expect mild to moderate muscle soreness. S/S of pneumothorax if dry needled over a lung field, and to seek immediate medical attention should they occur. Patient verbalized understanding of these instructions and education.  Patient Consent Given: Yes Education handout provided: Previously provided Muscles treated: lumbar and thoracic multifidi Electrical stimulation performed: No Parameters: N/A Treatment response/outcome: increased soft tissue length palpated  Treatment: 03/20/22  Manual Abdominal release and mobility of ascending, descending colon, stomach Lumbar and thoracic paraspinals  Trigger Point Dry-Needling  Treatment instructions: Expect mild to moderate muscle soreness. S/S of pneumothorax if dry needled over a lung field, and to seek immediate medical attention should they occur. Patient verbalized understanding of these  instructions and education.  Patient Consent Given: Yes Education handout provided: Previously provided Muscles treated: lumbar and thoracic multifidi Electrical stimulation performed: No Parameters: N/A Treatment response/outcome: increased soft tissue length palpated     PATIENT EDUCATION:  Education details: dilators and initial HEP Person educated: Patient and Armed forces training and education officer method: Explanation, Demonstration, Tactile cues, Verbal cues, and Handouts Education comprehension: verbalized understanding and returned demonstration     HOME EXERCISE PROGRAM:   Access Code: [facility] URL: [email or web address] Date: 02/01/2022 Prepared by: Jari Favre   Exercises - Ball squeeze with Kegel  - 3 x daily - 7 x weekly - 1 sets - 10 reps - 3 sec hold     ASSESSMENT:   CLINICAL IMPRESSION: Today's session focused on internal soft tissue mobilization and lengthening of sphincter and puborectalis muscles.  Pt continues to be very TTP to the left posterior especially.  Pt had no problem controlling bowel movements and still having more of them, but watery or small pellets are coming out.  Pt educated on way to work on lengthening the pelvic floor muscles so that she can work on healthier muscle tissues at home.  Pt is making progress slowly due to chronic condition and she is expected to continue to make progress with skilled PT.  She is recommended to continue to work with skilled PT to address these impairments so that she can improve function and have healthy bowel movements.       OBJECTIVE IMPAIRMENTS decreased coordination, decreased endurance, decreased strength, and impaired tone.    ACTIVITY LIMITATIONS community activity and toileting  .    PERSONAL FACTORS Past/current experiences, Time since onset of injury/illness/exacerbation, and 3+ comorbidities: slow transit bowels, myalgia, IBS  are also affecting patient's functional outcome.      REHAB  POTENTIAL: Good   CLINICAL DECISION MAKING: Unstable/unpredictable   EVALUATION COMPLEXITY: Moderate     GOALS: Goals reviewed with patient? Yes   SHORT TERM GOALS: Target date: 04/26/2022   Ind with initial HEP Baseline:  Goal status: met (03/10/22)      LONG TERM GOALS: Target date: 08/07/22    Pt will be independent with advanced HEP to maintain improvements made throughout therapy   Baseline:  Goal status: ongoing   2.  Pt will report 2 solid BMs per week due to improved muscle tone and coordination with BMs.  Baseline: 1 small this week Goal status: ongoing   3.  Pt will report her BMs are complete due to improved bowel habits and evacuation techniques.  Baseline: improved frequency Goal status: ongoing   4.  Pt will be able to sustain pelvic floor contraction for at least 10 seconds for improved functional trunk support during activities such  as lifting Baseline: very minimal change in tone Goal status: Ongoing     PLAN: PT FREQUENCY: 1x/week   PT DURATION: 16 weeks   PLANNED INTERVENTIONS: Therapeutic exercises, Therapeutic activity, Neuromuscular re-education, Balance training, Gait training, Patient/Family education, Joint mobilization, Dry Needling, Electrical stimulation, Cryotherapy, Moist heat, Taping, Biofeedback, and Manual therapy   PLAN FOR NEXT SESSION: add to Access Code: [email or web address]; f/u on internal STM at home; internal STM if tolerated for coccyx mobs; add coccyx mobs qped and seated, basic core strengthening as able     [employer], PT 04/17/2022, 5:30 PM

## 2022-04-19 ENCOUNTER — Ambulatory Visit (HOSPITAL_BASED_OUTPATIENT_CLINIC_OR_DEPARTMENT_OTHER): Payer: Medicaid Other | Admitting: Physical Therapy

## 2022-04-19 ENCOUNTER — Encounter (HOSPITAL_BASED_OUTPATIENT_CLINIC_OR_DEPARTMENT_OTHER): Payer: Self-pay | Admitting: Physical Therapy

## 2022-04-19 DIAGNOSIS — M6281 Muscle weakness (generalized): Secondary | ICD-10-CM

## 2022-04-19 DIAGNOSIS — R262 Difficulty in walking, not elsewhere classified: Secondary | ICD-10-CM | POA: Diagnosis not present

## 2022-04-19 DIAGNOSIS — M791 Myalgia, unspecified site: Secondary | ICD-10-CM

## 2022-04-19 DIAGNOSIS — R279 Unspecified lack of coordination: Secondary | ICD-10-CM

## 2022-04-19 DIAGNOSIS — M62838 Other muscle spasm: Secondary | ICD-10-CM

## 2022-04-19 NOTE — Therapy (Signed)
OUTPATIENT PHYSICAL THERAPY TREATMENT NOTE   Patient Name: Rachel [name] MRN: [id number] DOB:09/21/1986, 35 y.o., female Today's Date: 04/19/2022   END OF SESSION:   PT End of Session - 04/19/22 1515     Visit Number 13    Number of Visits 12    Date for PT Re-Evaluation 08/07/22    Authorization Type medicaid healthy blue    PT Start Time 1330    PT Stop Time 1410    PT Time Calculation (min) 40 min    Activity Tolerance Patient tolerated treatment well    Behavior During Therapy WFL for tasks assessed/performed              Past Medical History:  Diagnosis Date   ADHD (attention deficit hyperactivity disorder)    Anemia    Anxiety    Asthma    Bipolar disorder ([place])    Complication of anesthesia    slow to wake up    Delayed gastric emptying 02/23/2020   Depression    DUB (dysfunctional uterine bleeding) 02/23/2020   Ehlers-Danlos syndrome    Endometriosis    Gastroparesis    GERD (gastroesophageal reflux disease)    Heart murmur    Hiatal hernia 04/06/2020   Myalgia 04/06/2020   Parasitic infection 02/23/2020   Pneumonia    Poor appetite 02/23/2020   RUQ pain 02/23/2020   Vitamin D deficiency    Weight loss 02/23/2020   Past Surgical History:  Procedure Laterality Date   barium study     COLONOSCOPY     CYSTOSCOPY N/A 07/12/2021   Procedure: CYSTOSCOPY;  Surgeon: [name], MD;  Location: [facility];  Service: Gynecology;  Laterality: N/A;   ESOPHAGOGASTRODUODENOSCOPY  2011   and a ph study as well.    LAPAROSCOPY N/A 01/26/2021   Procedure: LAPAROSCOPY DIAGNOSTIC;  Surgeon: [name], MD;  Location: [facility];  Service: Gynecology;  Laterality: N/A;   NASAL SEPTUM SURGERY     TONGUE FLAP     TOTAL LAPAROSCOPIC HYSTERECTOMY WITH SALPINGECTOMY Bilateral 07/12/2021   Procedure: TOTAL LAPAROSCOPIC HYSTERECTOMY WITH SALPINGECTOMY, OOPHORECTOMY;  Surgeon: [name], MD;  Location: [facility];  Service: Gynecology;   Laterality: Bilateral;   UPPER GASTROINTESTINAL ENDOSCOPY     WISDOM TOOTH EXTRACTION     Patient Active Problem List   Diagnosis Date Noted   Iron deficiency anemia 01/20/2022   Acute hyponatremia 01/18/2022   Pyelonephritis 01/18/2022   Protein-calorie malnutrition, severe 01/18/2022   sepsis secondary to pyelonephritis of left kidney 01/17/2022   Sepsis ([place]) 01/17/2022   Chronic abdominal pain 01/17/2022   Dyspnea 01/17/2022   Underweight 01/17/2022   Irritable bowel syndrome (IBS) 01/17/2022   Hypokalemia 01/17/2022   Vasomotor symptoms due to menopause 08/17/2021   S/P laparoscopic hysterectomy 07/12/2021   Preoperative exam for gynecologic surgery 06/20/2021   Adverse drug reaction 06/01/2021   Draining postoperative wound 02/28/2021   Encounter for postoperative care 02/16/2021   Endometriosis determined by laparoscopy    Pelvic pain in female 10/11/2020   Presence of subdermal contraceptive implant 10/11/2020   Dysmenorrhea 10/11/2020   Dyspareunia in female 10/11/2020   NSVT (nonsustained ventricular tachycardia) ([place]) 09/03/2020   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) 05/07/2012    RPCP: Charlott Rakes, MD   REFERRING PROVIDER: Charlott Rakes, MD   REFERRING DIAG: M79.10 (ICD-10-CM) - Myalgia   THERAPY DIAG:  Myalgia   Muscle weakness (generalized)   Difficulty in walking, not elsewhere classified   ONSET DATE: >70yr   SUBJECTIVE:                                                                                                                                                                                             SUBJECTIVE STATEMENT: "Was very tired after last session especially because I had another appointment right after" PERTINENT HISTORY:  Irritable bowel syndrome with both constipation and diarrhea          Moderate protein-calorie malnutrition bipolar disorder, anxiety, depression, GERD, IBS with constipation, status post laparoscopic TAHBSO due to abnormal uterine bleed    currently on Premarin by GYN) She was hospitalized for pyelonephritis from 01/17/2022 through 01/21/2022. PAIN:  Are you having pain? Yes: NPRS scale: 6/10 left hip Pain location: cerv spine, left shoulder and arm, LB, hips Pain description: ache Aggravating factors: overdoing although tolerance levels vary Relieving factors: rest     PRECAUTIONS: Other: being tested for [facility] Center[facility]los/hypermobile joints   WEIGHT BEARING RESTRICTIONS No   FALLS:  Has patient fallen in last 6 months? No   LIVING ENVIRONMENT: Lives with: lives with their family Lives in: House/apartment Stairs: Yes: Internal: 1 flight steps; yes     OCCUPATION: unable to work   PLOF: Independent with basic ADLs   PATIENT GOALS gain strength, decrease pain     OBJECTIVE:    COGNITION:           Overall cognitive status:  WFL although pt reports bouts of brain fog                                  SENSATION: Numbness and tingling LUE into cervical spine intermittent Occasional numbness and tingling in LE   MUSCLE LENGTH: Connective tissue disorder probable/hypermobile   POSTURE:  Very slight figure, slightly rounded shoulders       ROM throughout wfl Strength UE 3+/5 LE 3/5     FUNCTIONAL TESTS:  5 times sit to stand: 38 Timed up and go (TUG): 18 Berg Balance Scale: 21/56   GAIT: Distance walked: 300 Assistive device utilized: None Level of assistance: Complete  Independence Comments: decreased stride length. Upon initiation of gait cadence slowed       TODAY'S TREATMENT  Modified treatment today to not over exert as she has another appintmtnet later today.  Pt seen for aquatic therapy today.  Treatment took place in water 3.25-4.8 ft in depth at the [facility] pool. Temp of water was 91.  Pt entered/exited the pool via stairs step through pattern independently with bilat rail.  Walking with and without ue (yellow hand buoys) support forward, back and sidestepping x4 widths each -seated rest period   Plank on bench with hip extension x10. Cues for glut contraction with extension Plank with blue noodle 3x 30s hold. VC for execution, abdominal bracing and glut iso contraction  Seated on 3 step Flutter 20x3 Add/abd 3x20   Standing in 4 ft 1/4 noodle (noodle that is cut) pull down to knees 2x10  Sitting on noodle: hip hiking; ant/post pelvic tilts; Rotation.   Pt requires buoyancy for support and to offload joints with strengthening exercises. Viscosity of the water is needed for resistance of strengthening; water current perturbations provides challenge to standing balance unsupported, requiring increased core activation.      PATIENT EDUCATION:  Education details: Propertires of water, benefit of aquatic therapy Person educated:  pt and mother Education method: Explanation Education comprehension: verbalized understanding     HOME EXERCISE PROGRAM: Aquatic HEP to be assigned   ASSESSMENT:   CLINICAL IMPRESSION: Pt reports no increase pain after last session but fatigue. She reports feeling the Pilates plank exercises felt the most helpful for her core strength although realizes it spikes her fatigue quickly.  She tolerate session well.  Will continue seeing her once medicaid approves.  She will continue to benefit from aquatic skilled physical therapy to improve strength and toleration to activity using the properties of  water.      Patient is a 36 y.o. F who was seen today for physical therapy evaluation and treatment for myalgia. She is also being seen for pelvic floor rehab at [facility]). Pt has had hx of pain sx potentially due to a connective tissue disorder. She presents frail, slight frame ~ 97lbs.  Gastroparesis inhibiting improved eating patterns.  Pt not working. Good days she may be able to walk for 30 minutes, bad days in bed.  She will benefit from skilled aquatic PT to improve strength, toleration to activity and enhance motility of gut through movement using properties of water to facilitate progression towards goals decreasing external factors.     OBJECTIVE IMPAIRMENTS decreased activity tolerance, decreased balance, decreased endurance, decreased mobility, and decreased strength.    ACTIVITY LIMITATIONS cleaning, community activity, driving, meal prep, occupation, laundry, yard work, and shopping.    PERSONAL FACTORS Age, Behavior pattern, and 1-2 comorbidities: IBS, malnutrition  are also affecting patient's functional outcome.      REHAB POTENTIAL: fair   CLINICAL DECISION MAKING: Evolving/moderate complexity   EVALUATION COMPLEXITY: Moderate     GOALS: Goals reviewed with patient? Yes   SHORT TERM GOALS: Target date: 04/04/2022   Pt will tolerate >30 minute aquatic session with rest periods as needed to demonstrate improved toleration to activity Baseline:Not yet completed Goal status: Achieved   2.  Pt will be indep with initial aquatic HEP with access to a pool Baseline: no HEP Goal status: ongoing no access to outside pool yet   3.  Pt will improve on LE strength up 1/2 to 1 full grade Baseline:3/5 Goal status: INITIAL  LONG TERM GOALS: Target date: 05/02/2022   Pt will improve on Berg Balance test to >or= 35/56 to demonstrate improved balance and decreased fall risk Baseline: 21/56 Goal status: INITIAL   2.  Pt will improve on 5xSTS test to  <25s Baseline: 38 Goal status: INITIAL   3.  Pt will gain up to 2lb to improve health by increasing appetite through increased movement Baseline: 97 lbs Goal status: INITIAL   4.  Pt will be indep with final HEP with knowledge how to adjust according to her day to day ability to tolerate Baseline: no HEP Goal status: INITIAL         PLAN: PT FREQUENCY: 1x/week   PT DURATION: 8 weeks   PLANNED INTERVENTIONS: Therapeutic exercises, Therapeutic activity, Neuromuscular re-education, Balance training, Gait training, Patient/Family education, Joint mobilization, Stair training, Aquatic Therapy, and Taping.   PLAN FOR NEXT SESSION: tAdd adductor sets with buoyancy ball to overlap pelvic floor exercises with strengthening of LE in aquatics  Harriette Tovey (Frankie) Denese Mentink MPT 04/19/2022, 3:16 PM   Check all possible CPT codes: 43329- Therapeutic Exercise, [phone]- Neuro Re-education, and [phone] - Aquatic therapy     If treatment provided at initial evaluation, no treatment charged due to lack of authorization.

## 2022-04-20 ENCOUNTER — Ambulatory Visit: Payer: Medicaid Other | Admitting: Clinical

## 2022-04-20 DIAGNOSIS — F4321 Adjustment disorder with depressed mood: Secondary | ICD-10-CM

## 2022-04-20 NOTE — Patient Instructions (Signed)
Stages of Grief  Denial (including shock and numbness) Anger (including emotional outbursts and fear) Bargaining (including searching for answers, pleading for a different outcome, disorganization, panic) Depression (including guilt, loneliness, isolating, difficulty re-entering "normal" life) Acceptance (including hope, affirmations, finding new strength, new patterns, building new relationships, and reaching out to help others)  These stages do not necessarily come in this order, and there is no time frame for grief. It takes as long as it takes. For some, it may seem easier to avoid feeling emotional pain, but in order to heal, it helps to allow time and space to feel the emotions, as they are, and to allow the grieving process to take place. The first year after a significant loss may be the most difficult. Reach out to friends, family, and/or join a support group of others who are also grieving. Find ways to maintain connection to lost loved ones.

## 2022-04-26 NOTE — Telephone Encounter (Signed)
Thank you Alinda Sierras.  I have reviewed the document.

## 2022-04-27 ENCOUNTER — Ambulatory Visit (HOSPITAL_BASED_OUTPATIENT_CLINIC_OR_DEPARTMENT_OTHER): Payer: Self-pay | Admitting: Physical Therapy

## 2022-04-27 NOTE — BH Specialist Note (Addendum)
Integrated Behavioral Health via Telemedicine Visit  05/11/2022 Rachel [name] [id number]  Number of [facility] Clinician visits: Additional Visit  Session Start time: 7989   Session End time: 1200  Total time in minutes: 72   Referring Provider: Lynnda Shields, MD Patient/Family location: Home Rachel [name] Provider location: [name]  All persons participating in visit: [name] and Rachel [name]   Types of Service: Individual psychotherapy and Telephone visit  I connected with Rachel [name] [name]  via  Telephone or Video Enabled Telemedicine Application  (Video is Tree surgeon) and verified that I am speaking with the correct person using two identifiers. Discussed confidentiality: Yes   I discussed the limitations of telemedicine and the availability of in person appointments.  Discussed there is a possibility of technology failure and discussed alternative modes of communication if that failure occurs.  I discussed that engaging in this telemedicine visit, they consent to the provision of behavioral healthcare and the services will be billed under their insurance.  Patient and/or legal guardian expressed understanding and consented to Telemedicine visit: Yes   Presenting Concerns: Patient and/or family reports the following symptoms/concerns: Navigating current relationship issues as they are affected by previous relationship. Duration of problem: Ongoing; Severity of problem: moderate  Patient and/or Family's Strengths/Protective Factors: Social connections, Social and Emotional competence, Concrete supports in place (healthy food, safe environments, etc.), Sense of purpose, and Physical Health (exercise, healthy diet, medication compliance, etc.)  Goals Addressed: Patient will:  Reduce symptoms of: anxiety and stress    Demonstrate  ability to: Increase healthy adjustment to current life circumstances  Progress towards Goals: Ongoing  Interventions: Interventions utilized:  ACT (Acceptance and Commitment Therapy) Standardized Assessments completed: Not Needed  Patient and/or Family Response: Patient agrees with treatment [name].   Assessment: Patient currently experiencing ADHD, PTSD, Psychosocial stress .   Patient may benefit from continued therapeutic interventions.  [name]: Follow up with behavioral health clinician on : Two weeks Behavioral recommendations:  -Continue using self-coping strategies that have continued to be helpful to manage thoughts and emotions -Continue using open communication with partner Referral(s): [facility] (In Clinic)  I discussed the assessment and treatment [name] with the patient and/or parent/guardian. They were provided an opportunity to ask questions and all were answered. They agreed with the [name] and demonstrated an understanding of the instructions.   They were advised to call back or seek an in-person evaluation if the symptoms worsen or if the condition fails to improve as anticipated.  Rachel Marque, LCSW     05/02/2022    2:26 PM 03/06/2022    9:40 AM 03/06/2022    9:39 AM 01/30/2022    9:56 AM 01/11/2022    3:39 PM  Depression screen PHQ 2/9  Decreased Interest 0 0 0 0 0  Down, Depressed, Hopeless 0 0 0 0 0  PHQ - 2 Score 0 0 0 0 0  Altered sleeping '2   3 2  '$ Tired, decreased energy '1   2 1  '$ Change in appetite '2   2 2  '$ Feeling bad or failure about yourself  0   0 0  Trouble concentrating '1   1 2  '$ Moving slowly or fidgety/restless 0   0 0  Suicidal thoughts 0   0 0  PHQ-9 Score '6   8 7      '$ 05/02/2022    2:26 PM 01/30/2022  9:56 AM 10/19/2021   11:25 AM 06/20/2021    8:45 AM  GAD 7 : Generalized Anxiety Score  Nervous, Anxious, on Edge 0 0 1 1  Control/stop worrying 0 0 0 0  Worry too much - different things 0 0 1 0  Trouble  relaxing '2 1 1 1  '$ Restless 0 0 0 0  Easily annoyed or irritable 1 0 1 0  Afraid - awful might happen 0 0 1 0  Total GAD 7 Score '3 1 5 '$ 2

## 2022-04-27 NOTE — Telephone Encounter (Signed)
Noted.  Dr Margarita Rana to discuss with patient at her upcoming appointment

## 2022-05-02 ENCOUNTER — Ambulatory Visit: Payer: Medicaid Other | Attending: Family Medicine | Admitting: Family Medicine

## 2022-05-02 ENCOUNTER — Encounter: Payer: Self-pay | Admitting: Family Medicine

## 2022-05-02 VITALS — BP 110/74 | HR 69 | Temp 98.1°F | Ht 62.0 in | Wt 107.0 lb

## 2022-05-02 DIAGNOSIS — M5412 Radiculopathy, cervical region: Secondary | ICD-10-CM

## 2022-05-02 DIAGNOSIS — E43 Unspecified severe protein-calorie malnutrition: Secondary | ICD-10-CM

## 2022-05-02 DIAGNOSIS — M791 Myalgia, unspecified site: Secondary | ICD-10-CM | POA: Diagnosis not present

## 2022-05-02 DIAGNOSIS — N12 Tubulo-interstitial nephritis, not specified as acute or chronic: Secondary | ICD-10-CM | POA: Diagnosis not present

## 2022-05-02 NOTE — Patient Instructions (Signed)
Cervical Radiculopathy  Cervical radiculopathy means that a nerve in the neck (a cervical nerve) is pinched or bruised. This can happen because of an injury to the cervical spine (vertebrae) in the neck, or as a normal part of getting older. This condition can cause pain or loss of feeling (numbness) that runs from your neck all the way down to your arm and fingers. Often, this condition gets better with rest. Treatment may be needed if the condition does not get better. What are the causes? A neck injury. A bulging disk in your spine. Sudden muscle tightening (muscle spasms). Tight muscles in your neck due to overuse. Arthritis. Breakdown in the bones and joints of the spine (spondylosis) due to getting older. Bone spurs that form near the nerves in the neck. What are the signs or symptoms? Pain. The pain may: Run from the neck to the arm and hand. Be very bad or irritating. Get worse when you move your neck. Loss of feeling or tingling in your arm or hand. Weakness in your arm or hand, in very bad cases. How is this treated? In many cases, treatment is not needed for this condition. With rest, the condition often gets better over time. If treatment is needed, options may include: Wearing a soft neck collar (cervical collar) for short periods of time. Doing exercises (physical therapy) to strengthen your neck muscles. Taking medicines. Having shots (injections) in your spine, in very bad cases. Having surgery. This may be needed if other treatments do not help. The type of surgery that is used will depend on the cause of your condition. Follow these instructions at home: If you have a soft neck collar: Wear it as told by your doctor. Take it off only as told by your doctor. Ask your doctor if you can take the collar off for cleaning and bathing. If you are allowed to take the collar off for cleaning or bathing: Follow instructions from your doctor about how to take off the collar  safely. Clean the collar by wiping it with mild soap and water and drying it completely. Take out any removable pads in the collar every 1-2 days. Wash them by hand with soap and water. Let them air-dry completely before you put them back in the collar. Check your skin under the collar for redness or sores. If you see any, tell your doctor. Managing pain     Take over-the-counter and prescription medicines only as told by your doctor. If told, put ice on the painful area. To do this: If you have a soft neck collar, take if off as told by your doctor. Put ice in a plastic bag. Place a towel between your skin and the bag. Leave the ice on for 20 minutes, 2-3 times a day. Take off the ice if your skin turns bright red. This is very important. If you cannot feel pain, heat, or cold, you have a greater risk of damage to the area. If using ice does not help, you can try using heat. Use the heat source that your doctor recommends, such as a moist heat pack or a heating pad. Place a towel between your skin and the heat source. Leave the heat on for 20-30 minutes. Take off the heat if your skin turns bright red. This is very important. If you cannot feel pain, heat, or cold, you have a greater risk of getting burned. You may try a gentle neck and shoulder rub (massage). Activity Rest as needed. Return   to your normal activities when your doctor says that it is safe. Do exercises as told by your doctor or physical therapist. You may have to avoid lifting. Ask your doctor how much you can safely lift. General instructions Use a flat pillow when you sleep. Do not drive while wearing a soft neck collar. If you do not have a soft neck collar, ask your doctor if it is safe to drive while your neck heals. Ask your doctor if you should avoid driving or using machines while you are taking your medicine. Do not smoke or use any products that contain nicotine or tobacco. If you need help quitting, ask your  doctor. Keep all follow-up visits. Contact a doctor if: Your condition does not get better with treatment. Get help right away if: Your pain gets worse and medicine does not help. You lose feeling or feel weak in your hand, arm, face, or leg. You have a high fever. Your neck is stiff. You cannot control when you poop or pee (have incontinence). You have trouble with walking, balance, or talking. Summary Cervical radiculopathy means that a nerve in the neck is pinched or bruised. A nerve can get pinched from a bulging disk, arthritis, an injury to the neck, or other causes. Symptoms include pain, tingling, or loss of feeling that goes from the neck to the arm or hand. Weakness in your arm or hand can happen in very bad cases. Treatment may include resting, wearing a soft neck collar, and doing exercises. You might need to take medicines for pain. In very bad cases, shots or surgery may be needed. This information is not intended to replace advice given to you by your health care provider. Make sure you discuss any questions you have with your health care provider. Document Revised: 05/05/2021 Document Reviewed: 05/05/2021 Elsevier Patient Education  2023 [employer]  

## 2022-05-02 NOTE — Progress Notes (Unsigned)
Referral questions

## 2022-05-02 NOTE — Progress Notes (Unsigned)
Subjective:  Patient ID: Rachel [name], female    DOB: 1986/05/24  Age: 36 y.o. MRN: [id number]  CC: Hospitalization Follow-up   HPI Rachel [name] is a 36 y.o. year old female with a history of bipolar disorder, anxiety, depression, GERD, IBS with constipation, status post laparoscopic TAHBSO due to abnormal uterine bleed currently on Premarin by GYN) She was hospitalized last month for acute pyelonephritis and treated with IV Rocephin.  Urine culture revealed E. coli which was pansensitive.  Subsequently discharged on a course of Bactrim.  Interval History:  She would like to see a kidney specialist due to recurrent UTI  She states she has not been able to see a Rheumatologist   Aquatic She states when her shoulder pops out she thinks her neck has been injured with associated tingling in her LUE which radiates  She has been ingesting Ensure x3 /day in addition to  Past Medical History:  Diagnosis Date   ADHD (attention deficit hyperactivity disorder)    Anemia    Anxiety    Asthma    Bipolar disorder ([place])    Complication of anesthesia    slow to wake up    Delayed gastric emptying 02/23/2020   Depression    DUB (dysfunctional uterine bleeding) 02/23/2020   Ehlers-Danlos syndrome    Endometriosis    Gastroparesis    GERD (gastroesophageal reflux disease)    Heart murmur    Hiatal hernia 04/06/2020   Myalgia 04/06/2020   Parasitic infection 02/23/2020   Pneumonia    Poor appetite 02/23/2020   RUQ pain 02/23/2020   Vitamin D deficiency    Weight loss 02/23/2020    Past Surgical History:  Procedure Laterality Date   barium study     COLONOSCOPY     CYSTOSCOPY N/A 07/12/2021   Procedure: CYSTOSCOPY;  Surgeon: [name], MD;  Location: [place];  Service: Gynecology;  Laterality: N/A;   ESOPHAGOGASTRODUODENOSCOPY  2011   and a ph study as well.    LAPAROSCOPY N/A 01/26/2021   Procedure: LAPAROSCOPY DIAGNOSTIC;  Surgeon: [name], MD;   Location: [facility];  Service: Gynecology;  Laterality: N/A;   NASAL SEPTUM SURGERY     TONGUE FLAP     TOTAL LAPAROSCOPIC HYSTERECTOMY WITH SALPINGECTOMY Bilateral 07/12/2021   Procedure: TOTAL LAPAROSCOPIC HYSTERECTOMY WITH SALPINGECTOMY, OOPHORECTOMY;  Surgeon: [name], MD;  Location: [facility];  Service: Gynecology;  Laterality: Bilateral;   UPPER GASTROINTESTINAL ENDOSCOPY     WISDOM TOOTH EXTRACTION      Family History  Problem Relation Age of Onset   Colon polyps Mother    Depression Mother    Anxiety disorder Mother    Hypertension Mother    Hyperlipidemia Mother    Asthma Mother    Endometriosis Mother    Fibroids Mother    Fibromyalgia Mother    Migraines Mother    Irritable bowel syndrome Mother    Depression Father    Hypertension Father    Hyperlipidemia Father    Bipolar disorder Father    Bipolar disorder Sister    ADD / ADHD Sister    Asthma Sister    Endometriosis Sister    Seizures Sister    Bipolar disorder Sister    Anxiety disorder Sister    Heart disease Maternal Grandmother    Endometriosis Maternal Grandmother    Fibroids Maternal Grandmother    Hyperlipidemia Maternal Grandmother    Hypertension Maternal Grandmother    Diabetes Maternal Grandmother  Fibromyalgia Maternal Grandmother    Heart disease Maternal Grandfather    Diabetes Maternal Grandfather    Heart disease Paternal Grandmother    Heart disease Paternal Grandfather    Colon cancer Maternal Great-grandmother    Pancreatic cancer Neg Hx    Esophageal cancer Neg Hx    Stomach cancer Neg Hx     Social History   Socioeconomic History   Marital status: Divorced    Spouse name: Not on file   Number of children: Not on file   Years of education: Not on file   Highest education level: Not on file  Occupational History   Not on file  Tobacco Use   Smoking status: Never   Smokeless tobacco: Never  Vaping Use   Vaping Use: Never used  Substance and Sexual  Activity   Alcohol use: Yes    Alcohol/week: 1.0 standard drink of alcohol    Types: 1 Glasses of wine per week    Comment: 1 glass of wine per week   Drug use: No   Sexual activity: Not Currently  Other Topics Concern   Not on file  Social History Narrative   Not on file   Social Determinants of Health   Financial Resource Strain: Not on file  Food Insecurity: No Food Insecurity (02/21/2021)   Hunger Vital Sign    Worried About Running Out of Food in the Last Year: Never true    Ran Out of Food in the Last Year: Never true  Transportation Needs: No Transportation Needs (02/21/2021)   PRAPARE - Hydrologist (Medical): No    Lack of Transportation (Non-Medical): No  Physical Activity: Not on file  Stress: Not on file  Social Connections: Not on file    Allergies  Allergen Reactions   Amphetamine-Dextroamphetamine Other (See Comments)    Triggered bipolar, caused depression, and suicidal thoughts   Lupron [Leuprolide] Anaphylaxis   Orilissa [Elagolix] Anaphylaxis   Penicillins Anaphylaxis, Swelling and Other (See Comments)    Potential for anaphylaxis confirmed. Tolerates Cefphalosporins No "-cillins"!!!! Did it involve swelling of the face/tongue/throat, SOB, or low BP? Yes Did it involve sudden or severe rash/hives, skin peeling, or any reaction on the inside of your mouth or nose? Unknown Did you need to seek medical attention at a hospital or doctor's office? Unknown When did it last happen? teenager      If all above answers are "NO", may proceed with cephalosporin use.   Chlorhexidine Hives    DuraPrep    Doxycycline Itching, Nausea And Vomiting and Other (See Comments)    Caused high fever, also   Mixed Ragweed Other (See Comments)    Unknown, Mother and pt state she can't have    Olanzapine Other (See Comments)    Caused mania   Other Other (See Comments)    Stimulants, mood stabilizers, and antidepressants = Triggered bipolar, caused  depression, and suicidal thoughts   Prozac [Fluoxetine Hcl] Other (See Comments)    Triggered bipolar, caused depression, and suicidal thoughts   Stevia Glycerite Extract [Flavoring Agent] Other (See Comments)    Unknown reaction; mother and pt confirm any Artificial Sweeteners cause reaction   Tape Other (See Comments)    Paper tape is tolerated   Triple Antibiotic Pain Relief [Neomy-Bacit-Polymyx-Pramoxine] Hives   Reglan [Metoclopramide] Anxiety and Other (See Comments)    Triggered bipolar, caused depression, suicidal thoughts, muscle twitching, stiffness, and high anxiety     Outpatient Medications Prior to  Visit  Medication Sig Dispense Refill   albuterol (PROVENTIL) (2.5 MG/3ML) 0.083% nebulizer solution Take 3 mLs (2.5 mg total) by nebulization every 6 (six) hours as needed for wheezing or shortness of breath. Max 4 doses per day 75 mL 0   albuterol (VENTOLIN HFA) 108 (90 Base) MCG/ACT inhaler Inhale 1-2 puffs into the lungs every 6 (six) hours as needed for wheezing or shortness of breath. 8 g 1   Ascorbic Acid (VITAMIN C) 1000 MG tablet Take 1,000 mg by mouth 2 (two) times daily.     B Complex Vitamins (B COMPLEX PO) Take 1 capsule by mouth daily.     Calcium-Magnesium-Vitamin D 600-40-500 MG-MG-UNIT TB24 Take 3 tablets by mouth in the morning and at bedtime.     Cholecalciferol (VITAMIN D3) 50 MCG (2000 UT) TABS Take 2,000 Units by mouth daily.     ELDERBERRY PO Take 1 tablet by mouth daily.     EPINEPHrine 0.3 mg/0.3 mL IJ SOAJ injection Inject 0.3 mg into the muscle as needed for anaphylaxis. 2 each 0   estrogens, conjugated, (PREMARIN) 0.45 MG tablet Take 1 tablet (0.45 mg total) by mouth daily. 30 tablet 6   estrogens, conjugated, (PREMARIN) 0.625 MG tablet Take 1 tablet (0.625 mg total) by mouth daily. Take daily for 21 days then do not take for 7 days. 30 tablet 11   Ferrous Sulfate (IRON PO) Take 1 capsule by mouth daily.     fexofenadine (ALLEGRA) 180 MG tablet Take 180 mg  by mouth daily.     ibuprofen (ADVIL) 600 MG tablet Take 1 tablet (600 mg total) by mouth every 6 (six) hours as needed for headache, mild pain, moderate pain or cramping. 30 tablet 2   linaclotide (LINZESS) 290 MCG CAPS capsule Take 1 capsule (290 mcg total) by mouth daily before breakfast. 30 capsule 3   Melatonin 10 MG TABS Take 10 mg by mouth at bedtime.     MIRALAX 17 GM/SCOOP powder Take 17 g by mouth See admin instructions. Mix 17 g of powder into water and drink once a day     Multiple Vitamins-Minerals (ADULT ONE DAILY GUMMIES PO) Take 2 tablets by mouth daily.     Multiple Vitamins-Minerals (ZINC PO) Take 1 capsule by mouth daily.     Omega-3 Fatty Acids (FISH OIL) 1200 MG CAPS Take 1,200 mg by mouth daily.     promethazine (PHENERGAN) 12.5 MG tablet Take 12.5 mg by mouth See admin instructions. Take 12.5 mg by mouth every 6-8 hours as needed for nausea     sennosides-docusate sodium (SENOKOT-S) 8.6-50 MG tablet Take 1-2 tablets by mouth daily.     VITAMIN A PO Take 1 capsule by mouth daily.     No facility-administered medications prior to visit.     ROS Review of Systems  Constitutional:  Negative for activity change and appetite change.  HENT:  Negative for sinus pressure and sore throat.   Respiratory:  Negative for chest tightness, shortness of breath and wheezing.   Cardiovascular:  Negative for chest pain and palpitations.  Gastrointestinal:  Negative for abdominal distention, abdominal pain and constipation.  Genitourinary: Negative.   Musculoskeletal:        See HPI  Psychiatric/Behavioral:  Negative for behavioral problems and dysphoric mood.    *** Objective:  BP 110/74   Pulse 69   Temp 98.1 F (36.7 C)   Ht '5\' 2"'$  (1.575 m)   Wt 107 lb (48.5 kg)   LMP  (  LMP Unknown)   SpO2 98%   BMI 19.57 kg/m      05/02/2022    2:13 PM 03/27/2022    6:04 AM 03/26/2022    8:37 PM  BP/Weight  Systolic BP 885 027 741  Diastolic BP 74 82 78  Wt. (Lbs) 107    BMI 19.57  kg/m2        Physical Exam Constitutional:      Appearance: She is well-developed.  Cardiovascular:     Rate and Rhythm: Normal rate.     Heart sounds: Normal heart sounds. No murmur heard. Pulmonary:     Effort: Pulmonary effort is normal.     Breath sounds: Normal breath sounds. No wheezing or rales.  Chest:     Chest wall: No tenderness.  Abdominal:     General: Bowel sounds are normal. There is no distension.     Palpations: Abdomen is soft. There is no mass.     Tenderness: There is no abdominal tenderness.  Musculoskeletal:        General: Normal range of motion.     Right lower leg: No edema.     Left lower leg: No edema.  Neurological:     Mental Status: She is alert and oriented to person, place, and time.  Psychiatric:        Mood and Affect: Mood normal.    ***    Latest Ref Rng & Units 03/27/2022    7:20 AM 03/26/2022    2:59 AM 03/25/2022    2:28 AM  CMP  Glucose 70 - 99 mg/dL 91  102  175   BUN 6 - 20 mg/dL 5  <5  6   Creatinine 0.44 - 1.00 mg/dL 0.64  0.49  0.60   Sodium 135 - 145 mmol/L 137  140  139   Potassium 3.5 - 5.1 mmol/L 3.6  3.9  3.4   Chloride 98 - 111 mmol/L 100  110  111   CO2 22 - 32 mmol/L 32  24  24   Calcium 8.9 - 10.3 mg/dL 9.3  8.5  8.5     Lipid Panel  No results found for: "CHOL", "TRIG", "HDL", "CHOLHDL", "VLDL", "LDLCALC", "LDLDIRECT"  CBC    Component Value Date/Time   WBC 7.3 03/26/2022 0259   RBC 3.34 (L) 03/26/2022 0259   HGB 10.6 (L) 03/26/2022 0259   HGB 13.0 06/28/2020 1600   HCT 31.1 (L) 03/26/2022 0259   HCT 40.1 06/28/2020 1600   PLT 172 03/26/2022 0259   PLT 310 06/28/2020 1600   MCV 93.1 03/26/2022 0259   MCV 96 06/28/2020 1600   MCH 31.7 03/26/2022 0259   MCHC 34.1 03/26/2022 0259   RDW 14.1 03/26/2022 0259   RDW 12.9 06/28/2020 1600   LYMPHSABS 2.1 03/26/2022 0259   LYMPHSABS 2.1 06/28/2020 1600   MONOABS 0.9 03/26/2022 0259   EOSABS 0.3 03/26/2022 0259   EOSABS 0.2 06/28/2020 1600   BASOSABS 0.0  03/26/2022 0259   BASOSABS 0.1 06/28/2020 1600    No results found for: "HGBA1C"  Assessment & [name]:  There are no diagnoses linked to this encounter.  Health Care Maintenance: *** No orders of the defined types were placed in this encounter.   Follow-up: Return in about 6 months (around 11/01/2022) for Chronic medical conditions.       Charlott Rakes, MD, FAAFP. [facility] and [place], [place]   05/02/2022, 3:21 PM

## 2022-05-10 ENCOUNTER — Encounter (HOSPITAL_BASED_OUTPATIENT_CLINIC_OR_DEPARTMENT_OTHER): Payer: Self-pay | Admitting: Physical Therapy

## 2022-05-10 ENCOUNTER — Ambulatory Visit (HOSPITAL_BASED_OUTPATIENT_CLINIC_OR_DEPARTMENT_OTHER): Payer: Medicaid Other | Admitting: Physical Therapy

## 2022-05-10 DIAGNOSIS — M6281 Muscle weakness (generalized): Secondary | ICD-10-CM | POA: Diagnosis not present

## 2022-05-10 DIAGNOSIS — R279 Unspecified lack of coordination: Secondary | ICD-10-CM | POA: Diagnosis not present

## 2022-05-10 DIAGNOSIS — M62838 Other muscle spasm: Secondary | ICD-10-CM | POA: Diagnosis not present

## 2022-05-10 DIAGNOSIS — M791 Myalgia, unspecified site: Secondary | ICD-10-CM

## 2022-05-10 DIAGNOSIS — R262 Difficulty in walking, not elsewhere classified: Secondary | ICD-10-CM | POA: Diagnosis not present

## 2022-05-10 NOTE — Therapy (Signed)
OUTPATIENT PHYSICAL THERAPY TREATMENT NOTE   Patient Name: Rachel [name] MRN: [id number] DOB:Mar 13, 1986, 36 y.o., female Today's Date: 05/10/2022   END OF SESSION:   PT End of Session - 05/10/22 1031     Visit Number --   5 Aquatics   Number of Visits --   6 aquatics   Date for PT Re-Evaluation 08/07/22    Authorization Type medicaid healthy blue    PT Start Time 0953    PT Stop Time 1031    PT Time Calculation (min) 38 min    Activity Tolerance Patient tolerated treatment well    Behavior During Therapy WFL for tasks assessed/performed               Past Medical History:  Diagnosis Date   ADHD (attention deficit hyperactivity disorder)    Anemia    Anxiety    Asthma    Bipolar disorder ([place])    Complication of anesthesia    slow to wake up    Delayed gastric emptying 02/23/2020   Depression    DUB (dysfunctional uterine bleeding) 02/23/2020   Ehlers-Danlos syndrome    Endometriosis    Gastroparesis    GERD (gastroesophageal reflux disease)    Heart murmur    Hiatal hernia 04/06/2020   Myalgia 04/06/2020   Parasitic infection 02/23/2020   Pneumonia    Poor appetite 02/23/2020   RUQ pain 02/23/2020   Vitamin D deficiency    Weight loss 02/23/2020   Past Surgical History:  Procedure Laterality Date   barium study     COLONOSCOPY     CYSTOSCOPY N/A 07/12/2021   Procedure: CYSTOSCOPY;  Surgeon: [name], MD;  Location: [facility];  Service: Gynecology;  Laterality: N/A;   ESOPHAGOGASTRODUODENOSCOPY  2011   and a ph study as well.    LAPAROSCOPY N/A 01/26/2021   Procedure: LAPAROSCOPY DIAGNOSTIC;  Surgeon: [name], MD;  Location: [place] [facility];  Service: Gynecology;  Laterality: N/A;   NASAL SEPTUM SURGERY     TONGUE FLAP     TOTAL LAPAROSCOPIC HYSTERECTOMY WITH SALPINGECTOMY Bilateral 07/12/2021   Procedure: TOTAL LAPAROSCOPIC HYSTERECTOMY WITH SALPINGECTOMY, OOPHORECTOMY;  Surgeon: [name], MD;  Location: [place] [facility];   Service: Gynecology;  Laterality: Bilateral;   UPPER GASTROINTESTINAL ENDOSCOPY     WISDOM TOOTH EXTRACTION     Patient Active Problem List   Diagnosis Date Noted   Iron deficiency anemia 01/20/2022   Acute hyponatremia 01/18/2022   Pyelonephritis 01/18/2022   Protein-calorie malnutrition, severe 01/18/2022   sepsis secondary to pyelonephritis of left kidney 01/17/2022   Sepsis ([place]) 01/17/2022   Chronic abdominal pain 01/17/2022   Dyspnea 01/17/2022   Underweight 01/17/2022   Irritable bowel syndrome (IBS) 01/17/2022   Hypokalemia 01/17/2022   Vasomotor symptoms due to menopause 08/17/2021   S/P laparoscopic hysterectomy 07/12/2021   Preoperative exam for gynecologic surgery 06/20/2021   Adverse drug reaction 06/01/2021   Draining postoperative wound 02/28/2021   Encounter for postoperative care 02/16/2021   Endometriosis determined by laparoscopy    Pelvic pain in female 10/11/2020   Presence of subdermal contraceptive implant 10/11/2020   Dysmenorrhea 10/11/2020   Dyspareunia in female 10/11/2020   NSVT (nonsustained ventricular tachycardia) ([place]) 09/03/2020   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   Palpitations 07/26/2020   Family history of connective tissue disease 07/26/2020   Myalgia 04/06/2020   Hiatal hernia 04/06/2020   Parasitic infection 02/23/2020   Weight loss 02/23/2020   Poor appetite 02/23/2020   RUQ pain  02/23/2020   Delayed gastric emptying 02/23/2020   DUB (dysfunctional uterine bleeding) 02/23/2020   Cervical radiculopathy 05/31/2018   Hyperalgesia 05/31/2018   Acid reflux 12/26/2017   ADHD (attention deficit hyperactivity disorder) 05/07/2012   Bipolar 1 disorder ([place]) 05/07/2012    RPCP: Charlott Rakes, MD   REFERRING PROVIDER: Charlott Rakes, MD   REFERRING DIAG: M79.10 (ICD-10-CM) - Myalgia   THERAPY DIAG:  Myalgia   Muscle weakness (generalized)   Difficulty in walking, not elsewhere classified   ONSET DATE: >81yr   SUBJECTIVE:                                                                                                                                                                                             SUBJECTIVE STATEMENT: "Busy weekend, able to visit sister and enjoy time with nieces am paying for it a little. Gastroparesis is a little flared" PERTINENT HISTORY:  Irritable bowel syndrome with both constipation and diarrhea          Moderate protein-calorie malnutrition bipolar disorder, anxiety, depression, GERD, IBS with constipation, status post laparoscopic TAHBSO due to abnormal uterine bleed    currently on Premarin by GYN) She was hospitalized for pyelonephritis from 01/17/2022 through 01/21/2022. PAIN:  Are you having pain? Yes: NPRS scale: 6/10 left hip Pain location: cerv spine, left shoulder and arm, LB, hips Pain description: ache Aggravating factors: overdoing although tolerance levels vary Relieving factors: rest     PRECAUTIONS: Other: [facility]hypermobile joints   WEIGHT BEARING RESTRICTIONS No   FALLS:  Has patient fallen in last 6 months? No   LIVING ENVIRONMENT: Lives with: lives with their family Lives in: House/apartment Stairs: Yes: Internal: 1 flight steps; yes     OCCUPATION: unable to work   PLOF: Independent with basic ADLs   PATIENT GOALS gain strength, decrease pain     OBJECTIVE:    COGNITION:           Overall cognitive status:  WFL although pt reports bouts of brain fog                                  SENSATION: Numbness and tingling LUE into cervical spine intermittent Occasional numbness and tingling in LE   MUSCLE LENGTH: Connective tissue disorder probable/hypermobile   POSTURE:  Very slight figure, slightly rounded shoulders       ROM throughout wfl Strength UE 3+/5 LE 3/5     FUNCTIONAL TESTS:  5 times sit to stand: 38 Timed up and go (TUG): 18 Berg  Balance Scale: 21/56   GAIT: Distance walked: 300 Assistive  device utilized: None Level of assistance: Complete Independence Comments: decreased stride length. Upon initiation of gait cadence slowed       TODAY'S TREATMENT   Pt seen for aquatic therapy today.  Treatment took place in water 3.25-4.8 ft in depth at the [facility] pool. Temp of water was 91.  Pt entered/exited the pool via stairs step through pattern independently with bilat rail.  Walking with and without ue (yellow hand buoys) support forward, back and sidestepping x4 widths each  Straddling noodle cycling; add/abd; skiing. Clamshell x10.Cueing for pace and range. 2 rest periods Standing gastroc stretch R/L 5x 30s hold  STS seated on 3rd step 2x5 without UE support Standing in 4 ft 1/4 noodle (noodle that is cut) pull down to knees 2x10 Sitting on noodle: hip hiking; ant/post pelvic tilts; Rotation.   Pt requires buoyancy for support and to offload joints with strengthening exercises. Viscosity of the water is needed for resistance of strengthening; water current perturbations provides challenge to standing balance unsupported, requiring increased core activation.      PATIENT EDUCATION:  Education details: Propertires of water, benefit of aquatic therapy Person educated:  pt and mother Education method: Explanation Education comprehension: verbalized understanding     HOME EXERCISE PROGRAM: Aquatic HEP to be assigned   ASSESSMENT:   CLINICAL IMPRESSION: Pt with good effort. Completes to toleration all activities. Improvement in hip mobility as demonstrates by full movement without limitation to pain reporting localized fatigue in left hip as session progresses. Goals ongoing       Patient is a 36 y.o. F who was seen today for physical therapy evaluation and treatment for myalgia. She is also being seen for [facility] floor re[facility] at [facility] Pt has had hx of pain sx potentially due to a connective tissue disorder. She presents frail, slight frame ~  97lbs.  Gastroparesis inhibiting improved eating patterns.  Pt not working. Good days she may be able to walk for 30 minutes, bad days in bed.  She will benefit from skilled aquatic PT to improve strength, toleration to activity and enhance motility of gut through movement using properties of water to facilitate progression towards goals decreasing external factors.     OBJECTIVE IMPAIRMENTS decreased activity tolerance, decreased balance, decreased endurance, decreased mobility, and decreased strength.    ACTIVITY LIMITATIONS cleaning, community activity, driving, meal prep, occupation, laundry, yard work, and shopping.    PERSONAL FACTORS Age, Behavior pattern, and 1-2 comorbidities: IBS, malnutrition  are also affecting patient's functional outcome.      REHAB POTENTIAL: fair   CLINICAL DECISION MAKING: Evolving/moderate complexity   EVALUATION COMPLEXITY: Moderate     GOALS: Goals reviewed with patient? Yes   SHORT TERM GOALS: Target date: 04/04/2022   Pt will tolerate >30 minute aquatic session with rest periods as needed to demonstrate improved toleration to activity Baseline:Not yet completed Goal status: Achieved   2.  Pt will be indep with initial aquatic HEP with access to a pool Baseline: no HEP Goal status: ongoing no access to outside pool yet   3.  Pt will improve on LE strength up 1/2 to 1 full grade Baseline:3/5 Goal status: INITIAL     LONG TERM GOALS: Target date: 05/02/2022   Pt will improve on Berg Balance test to >or= 35/56 to demonstrate improved balance and decreased fall risk Baseline: 21/56 Goal status: INITIAL   2.  Pt will improve on 5xSTS test  to <25s Baseline: 38 Goal status: INITIAL   3.  Pt will gain up to 2lb to improve health by increasing appetite through increased movement Baseline: 97 lbs Goal status: INITIAL   4.  Pt will be indep with final HEP with knowledge how to adjust according to her day to day ability to tolerate Baseline:  no HEP Goal status: INITIAL         PLAN: PT FREQUENCY: 1x/week   PT DURATION: 8 weeks   PLANNED INTERVENTIONS: Therapeutic exercises, Therapeutic activity, Neuromuscular re-education, Balance training, Gait training, Patient/Family education, Joint mobilization, Stair training, Aquatic Therapy, and Taping.   PLAN FOR NEXT SESSION:Re-assess next visit. Add adductor sets with buoyancy ball to overlap pelvic floor exercises with strengthening of LE in aquatics  [place] ([place]) Arminda Foglio MPT 05/10/2022, 12:13 PM

## 2022-05-11 ENCOUNTER — Ambulatory Visit (INDEPENDENT_AMBULATORY_CARE_PROVIDER_SITE_OTHER): Payer: Medicaid Other | Admitting: Clinical

## 2022-05-11 DIAGNOSIS — Z658 Other specified problems related to psychosocial circumstances: Secondary | ICD-10-CM

## 2022-05-11 DIAGNOSIS — F909 Attention-deficit hyperactivity disorder, unspecified type: Secondary | ICD-10-CM

## 2022-05-11 DIAGNOSIS — F4310 Post-traumatic stress disorder, unspecified: Secondary | ICD-10-CM

## 2022-05-11 NOTE — Patient Instructions (Signed)
Center for [facility] at [facility] [place], [place] [address] [phone] (main office) [phone] ([place] office)                                                              ADHD/ Adults             [facility], [email or web address]                                         [address] 3rd floor         Call Ona at [phone], extension [phone] to begin phone interview            No Med Management/ Evaluations, Therapy (Individual and Group)                          Medicaid, some insurance,  sliding scale available                                     [place], [email or web address]                                               [address]                                        Med Management, Psychotherapy                        Medicaid, most insurance, sliding scale for uninsured         [facility] for [place], [place], [address], [place]                       [phone] (non-medical management of ADHD)    [phone] (outpatient medical management of ADHD)                           Most insurance, financial assistance available                                                     Adult Support Group  [facility]                                       [address], [place], [place]     Or contact Mel Almond at [phone] or [email or web address] for questions

## 2022-05-12 ENCOUNTER — Telehealth: Payer: Self-pay

## 2022-05-12 NOTE — Telephone Encounter (Signed)
Fax received from [facility] stating that they are not accepting any new referral at this time.  Pt is needing new referral sent.

## 2022-05-12 NOTE — Telephone Encounter (Signed)
[facility] previously declined. Can we try [facility] or [facility]? Thanks

## 2022-05-13 DIAGNOSIS — H1032 Unspecified acute conjunctivitis, left eye: Secondary | ICD-10-CM | POA: Diagnosis not present

## 2022-05-13 DIAGNOSIS — H18822 Corneal disorder due to contact lens, left eye: Secondary | ICD-10-CM | POA: Diagnosis not present

## 2022-05-19 ENCOUNTER — Ambulatory Visit (HOSPITAL_BASED_OUTPATIENT_CLINIC_OR_DEPARTMENT_OTHER): Payer: Medicaid Other | Attending: Family Medicine | Admitting: Physical Therapy

## 2022-05-19 ENCOUNTER — Encounter (HOSPITAL_BASED_OUTPATIENT_CLINIC_OR_DEPARTMENT_OTHER): Payer: Self-pay | Admitting: Physical Therapy

## 2022-05-19 DIAGNOSIS — M6281 Muscle weakness (generalized): Secondary | ICD-10-CM | POA: Insufficient documentation

## 2022-05-19 DIAGNOSIS — R279 Unspecified lack of coordination: Secondary | ICD-10-CM | POA: Insufficient documentation

## 2022-05-19 DIAGNOSIS — M791 Myalgia, unspecified site: Secondary | ICD-10-CM | POA: Diagnosis not present

## 2022-05-19 NOTE — Therapy (Signed)
OUTPATIENT PHYSICAL THERAPY TREATMENT NOTE   Patient Name: Rachel [name] MRN: [id number] DOB:06-30-1986, 36 y.o., female Today's Date: 05/19/2022   END OF SESSION:   PT End of Session - 05/19/22 1041     Visit Number 6    Number of Visits 12   6 aquatics   Date for PT Re-Evaluation 08/07/22    Authorization Type medicaid healthy blue    Authorization Time Period 6/28 - 8/26 6 visits Aquatics    PT Start Time 5284    PT Stop Time 1045    PT Time Calculation (min) 30 min    Activity Tolerance Patient tolerated treatment well;Other (comment)   had bout with gastroparesis limiting time   Behavior During Therapy [facility] for tasks assessed/performed               Past Medical History:  Diagnosis Date   ADHD (attention deficit hyperactivity disorder)    Anemia    Anxiety    Asthma    Bipolar disorder ([place])    Complication of anesthesia    slow to wake up    Delayed gastric emptying 02/23/2020   Depression    DUB (dysfunctional uterine bleeding) 02/23/2020   Ehlers-Danlos syndrome    Endometriosis    Gastroparesis    GERD (gastroesophageal reflux disease)    Heart murmur    Hiatal hernia 04/06/2020   Myalgia 04/06/2020   Parasitic infection 02/23/2020   Pneumonia    Poor appetite 02/23/2020   RUQ pain 02/23/2020   Vitamin D deficiency    Weight loss 02/23/2020   Past Surgical History:  Procedure Laterality Date   barium study     COLONOSCOPY     CYSTOSCOPY N/A 07/12/2021   Procedure: CYSTOSCOPY;  Surgeon: [name], MD;  Location: [place];  Service: Gynecology;  Laterality: N/A;   ESOPHAGOGASTRODUODENOSCOPY  2011   and a ph study as well.    LAPAROSCOPY N/A 01/26/2021   Procedure: LAPAROSCOPY DIAGNOSTIC;  Surgeon: [name], MD;  Location: [place];  Service: Gynecology;  Laterality: N/A;   NASAL SEPTUM SURGERY     TONGUE FLAP     TOTAL LAPAROSCOPIC HYSTERECTOMY WITH SALPINGECTOMY Bilateral 07/12/2021   Procedure: TOTAL  LAPAROSCOPIC HYSTERECTOMY WITH SALPINGECTOMY, OOPHORECTOMY;  Surgeon: [name], MD;  Location: [place];  Service: Gynecology;  Laterality: Bilateral;   UPPER GASTROINTESTINAL ENDOSCOPY     WISDOM TOOTH EXTRACTION     Patient Active Problem List   Diagnosis Date Noted   Iron deficiency anemia 01/20/2022   Acute hyponatremia 01/18/2022   Pyelonephritis 01/18/2022   Protein-calorie malnutrition, severe 01/18/2022   sepsis secondary to pyelonephritis of left kidney 01/17/2022   Sepsis ([place]) 01/17/2022   Chronic abdominal pain 01/17/2022   Dyspnea 01/17/2022   Underweight 01/17/2022   Irritable bowel syndrome (IBS) 01/17/2022   Hypokalemia 01/17/2022   Vasomotor symptoms due to menopause 08/17/2021   S/P laparoscopic hysterectomy 07/12/2021   Preoperative exam for gynecologic surgery 06/20/2021   Adverse drug reaction 06/01/2021   Draining postoperative wound 02/28/2021   Encounter for postoperative care 02/16/2021   Endometriosis determined by laparoscopy    Pelvic pain in female 10/11/2020   Presence of subdermal contraceptive implant 10/11/2020   Dysmenorrhea 10/11/2020   Dyspareunia in female 10/11/2020   NSVT (nonsustained ventricular tachycardia) ([place]) 09/03/2020   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   Palpitations 07/26/2020   Family history of connective tissue disease 07/26/2020   Myalgia 04/06/2020   Hiatal hernia 04/06/2020  Parasitic infection 02/23/2020   Weight loss 02/23/2020   Poor appetite 02/23/2020   RUQ pain 02/23/2020   Delayed gastric emptying 02/23/2020   DUB (dysfunctional uterine bleeding) 02/23/2020   Cervical radiculopathy 05/31/2018   Hyperalgesia 05/31/2018   Acid reflux 12/26/2017   ADHD (attention deficit hyperactivity disorder) 05/07/2012   Bipolar 1 disorder ([place]) 05/07/2012    RPCP: Charlott Rakes, MD   REFERRING PROVIDER: Charlott Rakes, MD   REFERRING DIAG: M79.10 (ICD-10-CM) - Myalgia   THERAPY DIAG:  Myalgia   Muscle  weakness (generalized)   Difficulty in walking, not elsewhere classified   ONSET DATE: >42yr   SUBJECTIVE:                                                                                                                                                                                            SUBJECTIVE STATEMENT: "Sore today. . ." PERTINENT HISTORY:  Irritable bowel syndrome with both constipation and diarrhea          Moderate protein-calorie malnutrition bipolar disorder, anxiety, depression, GERD, IBS with constipation, status post laparoscopic TAHBSO due to abnormal uterine bleed    currently on Premarin by GYN) She was hospitalized for pyelonephritis from 01/17/2022 through 01/21/2022. PAIN:  Are you having pain? Yes: NPRS scale: 6/10 general Pain location: cerv spine, left shoulder and arm, LB, hips Pain description: ache Aggravating factors: overdoing although tolerance levels vary Relieving factors: rest     PRECAUTIONS: Other: [facility]/hypermobile joints   WEIGHT BEARING RESTRICTIONS No   FALLS:  Has patient fallen in last 6 months? No   LIVING ENVIRONMENT: Lives with: lives with their family Lives in: House/apartment Stairs: Yes: Internal: 1 flight steps; yes     OCCUPATION: unable to work   PLOF: Independent with basic ADLs   PATIENT GOALS gain strength, decrease pain     OBJECTIVE:    COGNITION:           Overall cognitive status:  WFL although pt reports bouts of brain fog                                  SENSATION: Numbness and tingling LUE into cervical spine intermittent Occasional numbness and tingling in LE   MUSCLE LENGTH: Connective tissue disorder probable/hypermobile   POSTURE:  Very slight figure, slightly rounded shoulders       ROM throughout wfl Strength UE 3+/5 LE 3/5     FUNCTIONAL TESTS:  5 times sit to stand: 38 Timed up and go (TUG): 18 Berg Balance Scale:  21/56   GAIT: Distance walked:  300 Assistive device utilized: None Level of assistance: Complete Independence Comments: decreased stride length. Upon initiation of gait cadence slowed       TODAY'S TREATMENT   Pt seen for aquatic therapy today.  Treatment took place in water 3.25-4.8 ft in depth at the [facility] pool. Temp of water was 91.  Pt entered/exited the pool via stairs step through pattern independently with bilat rail.  Walking with and without ue (yellow hand buoys) support forward, back and sidestepping x4 widths each  Straddling noodle cycling; add/abd; skiing. Clamshell x10.Cueing for pace and range. 2 rest periods Standing gastroc stretch R/L 5x 30s hold Standing in 4 ft 1/4 noodle (noodle that is cut) pull down to knees 2x10 Sitting on noodle: hip hiking; ant/post pelvic tilts; Rotation.   Pt requires buoyancy for support and to offload joints with strengthening exercises. Viscosity of the water is needed for resistance of strengthening; water current perturbations provides challenge to standing balance unsupported, requiring increased core activation.      PATIENT EDUCATION:  Education details: Propertires of water, benefit of aquatic therapy Person educated:  pt and mother Education method: Explanation Education comprehension: verbalized understanding     HOME EXERCISE PROGRAM: Aquatic HEP to be assigned   ASSESSMENT:   CLINICAL IMPRESSION: Pt has gastroparesis flare during session, using bathroom twice. Unable to properly re-assess as does not tolerate objective testing. Will complete next visit.  She does report increase in weight up to ~105lbs meeting a LTG. Reports wanting to exercise out of pool.  She has access to stationary bike which she has used some.  Encouraged to use intermittently to toleration without or little resistance.  She completes session modified due to time and abdominal discomfort.  Effort is good. Pt continued with indep cycling after end of session. Goals  ongoing      Patient is a 36 y.o. F who was seen today for physical therapy evaluation and treatment for myalgia. She is also being seen for pelvic floor rehab at [place] ([place]). Pt has had hx of pain sx potentially due to a connective tissue disorder. She presents frail, slight frame ~ 97lbs.  Gastroparesis inhibiting improved eating patterns.  Pt not working. Good days she may be able to walk for 30 minutes, bad days in bed.  She will benefit from skilled aquatic PT to improve strength, toleration to activity and enhance motility of gut through movement using properties of water to facilitate progression towards goals decreasing external factors.     OBJECTIVE IMPAIRMENTS decreased activity tolerance, decreased balance, decreased endurance, decreased mobility, and decreased strength.    ACTIVITY LIMITATIONS cleaning, community activity, driving, meal prep, occupation, laundry, yard work, and shopping.    PERSONAL FACTORS Age, Behavior pattern, and 1-2 comorbidities: IBS, malnutrition  are also affecting patient's functional outcome.      REHAB POTENTIAL: fair   CLINICAL DECISION MAKING: Evolving/moderate complexity   EVALUATION COMPLEXITY: Moderate     GOALS: Goals reviewed with patient? Yes   SHORT TERM GOALS: Target date: 04/04/2022   Pt will tolerate >30 minute aquatic session with rest periods as needed to demonstrate improved toleration to activity Baseline:Not yet completed Goal status: Achieved   2.  Pt will be indep with initial aquatic HEP with access to a pool Baseline: no HEP Goal status: ongoing no access to outside pool yet   3.  Pt will improve on LE strength up 1/2 to 1 full grade Baseline:3/5 Goal status:  ongoing     LONG TERM GOALS: Target date: 08/07/2022   Pt will improve on Berg Balance test to >or= 35/56 to demonstrate improved balance and decreased fall risk Baseline: 21/56 Goal status: INITIAL   2.  Pt will improve on 5xSTS test to  <25s Baseline: 38 Goal status: INITIAL   3.  Pt will gain up to 2lb to improve health by increasing appetite through increased movement Baseline: 97 lbs Goal status:Achieved   4.  Pt will be indep with final HEP with knowledge how to adjust according to her day to day ability to tolerate Baseline: no HEP Goal status: INITIAL         PLAN: PT FREQUENCY: 1x/week   PT DURATION: 8 weeks   PLANNED INTERVENTIONS: Therapeutic exercises, Therapeutic activity, Neuromuscular re-education, Balance training, Gait training, Patient/Family education, Joint mobilization, Stair training, Aquatic Therapy, and Taping.   PLAN FOR NEXT SESSION:Re-assess next visit. Add adductor sets with buoyancy ball to overlap pelvic floor exercises with strengthening of LE in aquatics  Ayen Viviano (Frankie) Bryton Waight MPT 05/19/2022, 1:08 PM

## 2022-05-24 ENCOUNTER — Encounter (HOSPITAL_BASED_OUTPATIENT_CLINIC_OR_DEPARTMENT_OTHER): Payer: Self-pay | Admitting: Physical Therapy

## 2022-05-24 ENCOUNTER — Ambulatory Visit (HOSPITAL_BASED_OUTPATIENT_CLINIC_OR_DEPARTMENT_OTHER): Payer: Medicaid Other | Admitting: Physical Therapy

## 2022-05-24 DIAGNOSIS — M791 Myalgia, unspecified site: Secondary | ICD-10-CM | POA: Diagnosis not present

## 2022-05-24 DIAGNOSIS — R279 Unspecified lack of coordination: Secondary | ICD-10-CM | POA: Diagnosis not present

## 2022-05-24 DIAGNOSIS — M6281 Muscle weakness (generalized): Secondary | ICD-10-CM | POA: Diagnosis not present

## 2022-05-24 NOTE — Therapy (Signed)
OUTPATIENT PHYSICAL THERAPY TREATMENT NOTE Progress Note Reporting Period 03/07/22 to 05/24/22  See note below for Objective Data and Assessment of Progress/Goals.      Patient Name: Rachel [name] MRN: [id number] DOB:11-02-1986, 36 y.o., female Today's Date: 05/24/2022   END OF SESSION:   PT End of Session - 05/24/22 1126     Visit Number 7    Number of Visits 12    Date for PT Re-Evaluation 08/07/22    Authorization Type medicaid healthy blue    Authorization Time Period 6/28 - 8/26 6 visits Aquatics    Progress Note Due on Visit 17    PT Start Time 1116    PT Stop Time 1200    PT Time Calculation (min) 44 min    Activity Tolerance Patient tolerated treatment well;Other (comment)    Behavior During Therapy WFL for tasks assessed/performed               Past Medical History:  Diagnosis Date   ADHD (attention deficit hyperactivity disorder)    Anemia    Anxiety    Asthma    Bipolar disorder ([place])    Complication of anesthesia    slow to wake up    Delayed gastric emptying 02/23/2020   Depression    DUB (dysfunctional uterine bleeding) 02/23/2020   Ehlers-Danlos syndrome    Endometriosis    Gastroparesis    GERD (gastroesophageal reflux disease)    Heart murmur    Hiatal hernia 04/06/2020   Myalgia 04/06/2020   Parasitic infection 02/23/2020   Pneumonia    Poor appetite 02/23/2020   RUQ pain 02/23/2020   Vitamin D deficiency    Weight loss 02/23/2020   Past Surgical History:  Procedure Laterality Date   barium study     COLONOSCOPY     CYSTOSCOPY N/A 07/12/2021   Procedure: CYSTOSCOPY;  Surgeon: [name], MD;  Location: [employer];  Service: Gynecology;  Laterality: N/A;   ESOPHAGOGASTRODUODENOSCOPY  2011   and a ph study as well.    LAPAROSCOPY N/A 01/26/2021   Procedure: LAPAROSCOPY DIAGNOSTIC;  Surgeon: [name], MD;  Location: [place];  Service: Gynecology;  Laterality: N/A;   NASAL SEPTUM SURGERY     TONGUE  FLAP     TOTAL LAPAROSCOPIC HYSTERECTOMY WITH SALPINGECTOMY Bilateral 07/12/2021   Procedure: TOTAL LAPAROSCOPIC HYSTERECTOMY WITH SALPINGECTOMY, OOPHORECTOMY;  Surgeon: [name], MD;  Location: [place];  Service: Gynecology;  Laterality: Bilateral;   UPPER GASTROINTESTINAL ENDOSCOPY     WISDOM TOOTH EXTRACTION     Patient Active Problem List   Diagnosis Date Noted   Iron deficiency anemia 01/20/2022   Acute hyponatremia 01/18/2022   Pyelonephritis 01/18/2022   Protein-calorie malnutrition, severe 01/18/2022   sepsis secondary to pyelonephritis of left kidney 01/17/2022   Sepsis ([place]) 01/17/2022   Chronic abdominal pain 01/17/2022   Dyspnea 01/17/2022   Underweight 01/17/2022   Irritable bowel syndrome (IBS) 01/17/2022   Hypokalemia 01/17/2022   Vasomotor symptoms due to menopause 08/17/2021   S/P laparoscopic hysterectomy 07/12/2021   Preoperative exam for gynecologic surgery 06/20/2021   Adverse drug reaction 06/01/2021   Draining postoperative wound 02/28/2021   Encounter for postoperative care 02/16/2021   Endometriosis determined by laparoscopy    Pelvic pain in female 10/11/2020   Presence of subdermal contraceptive implant 10/11/2020   Dysmenorrhea 10/11/2020   Dyspareunia in female 10/11/2020   NSVT (nonsustained ventricular tachycardia) ([place]) 09/03/2020   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   Palpitations 07/26/2020  Family history of connective tissue disease 07/26/2020   Myalgia 04/06/2020   Hiatal hernia 04/06/2020   Parasitic infection 02/23/2020   Weight loss 02/23/2020   Poor appetite 02/23/2020   RUQ pain 02/23/2020   Delayed gastric emptying 02/23/2020   DUB (dysfunctional uterine bleeding) 02/23/2020   Cervical radiculopathy 05/31/2018   Hyperalgesia 05/31/2018   Acid reflux 12/26/2017   ADHD (attention deficit hyperactivity disorder) 05/07/2012   Bipolar 1 disorder ([place]) 05/07/2012    RPCP: Charlott Rakes, MD   REFERRING PROVIDER: Charlott Rakes, MD   REFERRING DIAG: M79.10 (ICD-10-CM) - Myalgia   THERAPY DIAG:  Myalgia   Muscle weakness (generalized)   Difficulty in walking, not elsewhere classified   ONSET DATE: >59yr   SUBJECTIVE:                                                                                                                                                                                            SUBJECTIVE STATEMENT: "Up to 109 lb, this has been life changing" PERTINENT HISTORY:  Irritable bowel syndrome with both constipation and diarrhea          Moderate protein-calorie malnutrition bipolar disorder, anxiety, depression, GERD, IBS with constipation, status post laparoscopic TAHBSO due to abnormal uterine bleed    currently on Premarin by GYN) She was hospitalized for pyelonephritis from 01/17/2022 through 01/21/2022. PAIN:  Are you having pain? Yes: NPRS scale: 6/10 general Pain location: cerv spine, left shoulder and arm, LB, hips Pain description: ache Aggravating factors: overdoing although tolerance levels vary Relieving factors: rest     PRECAUTIONS: Other: being [facility]/hypermobile joints   WEIGHT BEARING RESTRICTIONS No   FALLS:  Has patient fallen in last 6 months? No   LIVING ENVIRONMENT: Lives with: lives with their family Lives in: House/apartment Stairs: Yes: Internal: 1 flight steps; yes     OCCUPATION: unable to work   PLOF: Independent with basic ADLs   PATIENT GOALS gain strength, decrease pain     OBJECTIVE:    COGNITION:           Overall cognitive status:  WFL although pt reports bouts of brain fog                                  SENSATION: Numbness and tingling LUE into cervical spine intermittent Occasional numbness and tingling in LE   MUSCLE LENGTH: Connective tissue disorder probable/hypermobile   POSTURE:  Very slight figure, slightly rounded shoulders       ROM throughout wfl Strength UE 3+/5 LE  3/5  05/24/22 UE  4-/5              LE 4-/5     FUNCTIONAL TESTS:  5 times sit to stand: 38 Timed up and go (TUG): 18 Berg Balance Scale: 21/56  05/24/22 5 x STS: 33s TUG: 15 Berg: 37/56   GAIT: Distance walked: 300 Assistive device utilized: None Level of assistance: Complete Independence Comments: decreased stride length. Upon initiation of gait cadence slowed       TODAY'S TREATMENT   Pt seen for aquatic therapy today.  Treatment took place in water 3.25-4.8 ft in depth at the [facility] pool. Temp of water was 91.  Pt entered/exited the pool via stairs step through pattern independently with bilat rail.  Walking with and without support forward, back and sidestepping x4 widths each Sitting on noodle: hip hiking; ant/post pelvic tilts; Rotation.  Seated adductor squeeze using BB 5x10s hold STS from 3rd step x5 with iso add using BB Standing in 4 ft 1/4 noodle (noodle that is cut) pull down to knees x10 Kick board push pull feet together then staggered leading R/L x 1 Monster walk x 2 widths Hurdle walk x 2 widths Straddling noodle using hand paddles horizontal add/abd 2 widths.  Pt requires buoyancy for support and to offload joints with strengthening exercises. Viscosity of the water is needed for resistance of strengthening; water current perturbations provides challenge to standing balance unsupported, requiring increased core activation.      PATIENT EDUCATION:  Education details: Propertires of water, benefit of aquatic therapy Person educated:  pt and mother Education method: Explanation Education comprehension: verbalized understanding     HOME EXERCISE PROGRAM: Aquatic HEP to be assigned   ASSESSMENT:   CLINICAL IMPRESSION: Pt cued throughout to be mindful of all movement to avoid over stretching or activity. She has improved toleration to session demonstrating improving strength and endurance completing. Objective testing demonstrates improvement in Berg  from 21/56 to 37/56 demonstrating Improvement in strength (see chart above) and balance ability, decreasing fall risk. Minimal improvement in 5 x STS and TUG test as pt continues to be guarded with movement.  She has gain up to 109 lb from her low of ~94 demonstrating an improvement in overall health.  She will continue to benefit from skilled physical therapy to reach all goals and improve function.      Patient is a 36 y.o. F who was seen today for physical therapy evaluation and treatment for myalgia. She is also being seen for pelvic floor rehab at [facility] ([place]). Pt has had hx of pain sx potentially due to a connective tissue disorder. She presents frail, slight frame ~ 97lbs.  Gastroparesis inhibiting improved eating patterns.  Pt not working. Good days she may be able to walk for 30 minutes, bad days in bed.  She will benefit from skilled aquatic PT to improve strength, toleration to activity and enhance motility of gut through movement using properties of water to facilitate progression towards goals decreasing external factors.     OBJECTIVE IMPAIRMENTS decreased activity tolerance, decreased balance, decreased endurance, decreased mobility, and decreased strength.    ACTIVITY LIMITATIONS cleaning, community activity, driving, meal prep, occupation, laundry, yard work, and shopping.    PERSONAL FACTORS Age, Behavior pattern, and 1-2 comorbidities: IBS, malnutrition  are also affecting patient's functional outcome.      REHAB POTENTIAL: fair   CLINICAL DECISION MAKING: Evolving/moderate complexity   EVALUATION COMPLEXITY: Moderate     GOALS: Goals reviewed  with patient? Yes   SHORT TERM GOALS: Target date: 04/04/2022   Pt will tolerate >30 minute aquatic session with rest periods as needed to demonstrate improved toleration to activity Baseline:Not yet completed Goal status: Achieved   2.  Pt will be indep with initial aquatic HEP with access to a pool Baseline: no  HEP Goal status: ongoing no access to outside pool yet   3.  Pt will improve on LE strength up 1/2 to 1 full grade Baseline:3/5 Goal status: ongoing     LONG TERM GOALS: Target date: 08/07/2022   Pt will improve on Berg Balance test to >or= 35/56 to demonstrate improved balance and decreased fall risk Baseline: 21/56 Goal status: Met   2.  Pt will improve on 5xSTS test to <25s Baseline: 38 Goal status: onging   3.  Pt will gain up to 2lb to improve health by increasing appetite through increased movement Baseline: 97 lbs Goal status:Achieved   4.  Pt will be indep with final HEP with knowledge how to adjust according to her day to day ability to tolerate Baseline: no HEP Goal status: INITIAL         PLAN: PT FREQUENCY: 1x/week   PT DURATION: 8 weeks   PLANNED INTERVENTIONS: Therapeutic exercises, Therapeutic activity, Neuromuscular re-education, Balance training, Gait training, Patient/Family education, Joint mobilization, Stair training, Aquatic Therapy, and Taping.   PLAN FOR NEXT SESSION: Add adductor sets with buoyancy ball to overlap pelvic floor exercises with strengthening of LE in aquatics  Jazzmyne Rasnick (Frankie) Tria Noguera MPT 05/24/2022, 1:23 PM

## 2022-05-31 ENCOUNTER — Ambulatory Visit
Admission: RE | Admit: 2022-05-31 | Discharge: 2022-05-31 | Disposition: A | Discharge status: Home / Self Care | Payer: Medicaid Other | Source: Ambulatory Visit | Attending: Family Medicine | Admitting: Family Medicine

## 2022-05-31 ENCOUNTER — Ambulatory Visit (HOSPITAL_BASED_OUTPATIENT_CLINIC_OR_DEPARTMENT_OTHER): Payer: Medicaid Other | Admitting: Physical Therapy

## 2022-05-31 ENCOUNTER — Encounter (HOSPITAL_BASED_OUTPATIENT_CLINIC_OR_DEPARTMENT_OTHER): Payer: Self-pay | Admitting: Physical Therapy

## 2022-05-31 DIAGNOSIS — M5412 Radiculopathy, cervical region: Secondary | ICD-10-CM | POA: Diagnosis not present

## 2022-05-31 DIAGNOSIS — R279 Unspecified lack of coordination: Secondary | ICD-10-CM | POA: Diagnosis not present

## 2022-05-31 DIAGNOSIS — M6281 Muscle weakness (generalized): Secondary | ICD-10-CM

## 2022-05-31 DIAGNOSIS — M791 Myalgia, unspecified site: Secondary | ICD-10-CM

## 2022-05-31 MED ORDER — IOPAMIDOL (ISOVUE-300) INJECTION 61%
75.0000 mL | Freq: Once | INTRAVENOUS | Status: AC | PRN
  Administered 2022-05-31: 75 mL via INTRAVENOUS

## 2022-05-31 NOTE — Therapy (Signed)
OUTPATIENT PHYSICAL THERAPY TREATMENT NOTE Progress Note     Patient Name: Rachel [name] MRN: [id number] DOB:24-Nov-1985, 36 y.o., female Today's Date: 05/31/2022   END OF SESSION:   PT End of Session - 05/31/22 1124     Visit Number 8    Number of Visits 12    Date for PT Re-Evaluation 08/07/22    Authorization Type medicaid healthy blue    Authorization Time Period 6/28 - 8/26 6 visits Aquatics    Progress Note Due on Visit 17    PT Start Time 1116    PT Stop Time 1200    PT Time Calculation (min) 44 min    Activity Tolerance Patient tolerated treatment well;Other (comment)    Behavior During Therapy WFL for tasks assessed/performed                Past Medical History:  Diagnosis Date   ADHD (attention deficit hyperactivity disorder)    Anemia    Anxiety    Asthma    Bipolar disorder ([place])    Complication of anesthesia    slow to wake up    Delayed gastric emptying 02/23/2020   Depression    DUB (dysfunctional uterine bleeding) 02/23/2020   Ehlers-Danlos syndrome    Endometriosis    Gastroparesis    GERD (gastroesophageal reflux disease)    Heart murmur    Hiatal hernia 04/06/2020   Myalgia 04/06/2020   Parasitic infection 02/23/2020   Pneumonia    Poor appetite 02/23/2020   RUQ pain 02/23/2020   Vitamin D deficiency    Weight loss 02/23/2020   Past Surgical History:  Procedure Laterality Date   barium study     COLONOSCOPY     CYSTOSCOPY N/A 07/12/2021   Procedure: CYSTOSCOPY;  Surgeon: [name], MD;  Location: [place];  Service: Gynecology;  Laterality: N/A;   ESOPHAGOGASTRODUODENOSCOPY  2011   and a ph study as well.    LAPAROSCOPY N/A 01/26/2021   Procedure: LAPAROSCOPY DIAGNOSTIC;  Surgeon: [name], MD;  Location: [place];  Service: Gynecology;  Laterality: N/A;   NASAL SEPTUM SURGERY     TONGUE FLAP     TOTAL LAPAROSCOPIC HYSTERECTOMY WITH SALPINGECTOMY Bilateral 07/12/2021   Procedure: TOTAL  LAPAROSCOPIC HYSTERECTOMY WITH SALPINGECTOMY, OOPHORECTOMY;  Surgeon: [name], MD;  Location: [place];  Service: Gynecology;  Laterality: Bilateral;   UPPER GASTROINTESTINAL ENDOSCOPY     WISDOM TOOTH EXTRACTION     Patient Active Problem List   Diagnosis Date Noted   Iron deficiency anemia 01/20/2022   Acute hyponatremia 01/18/2022   Pyelonephritis 01/18/2022   Protein-calorie malnutrition, severe 01/18/2022   sepsis secondary to pyelonephritis of left kidney 01/17/2022   Sepsis ([place]) 01/17/2022   Chronic abdominal pain 01/17/2022   Dyspnea 01/17/2022   Underweight 01/17/2022   Irritable bowel syndrome (IBS) 01/17/2022   Hypokalemia 01/17/2022   Vasomotor symptoms due to menopause 08/17/2021   S/P laparoscopic hysterectomy 07/12/2021   Preoperative exam for gynecologic surgery 06/20/2021   Adverse drug reaction 06/01/2021   Draining postoperative wound 02/28/2021   Encounter for postoperative care 02/16/2021   Endometriosis determined by laparoscopy    Pelvic pain in female 10/11/2020   Presence of subdermal contraceptive implant 10/11/2020   Dysmenorrhea 10/11/2020   Dyspareunia in female 10/11/2020   NSVT (nonsustained ventricular tachycardia) ([place]) 09/03/2020   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   Palpitations 07/26/2020   Family history of connective tissue disease 07/26/2020   Myalgia 04/06/2020   Hiatal  hernia 04/06/2020   Parasitic infection 02/23/2020   Weight loss 02/23/2020   Poor appetite 02/23/2020   RUQ pain 02/23/2020   Delayed gastric emptying 02/23/2020   DUB (dysfunctional uterine bleeding) 02/23/2020   Cervical radiculopathy 05/31/2018   Hyperalgesia 05/31/2018   Acid reflux 12/26/2017   ADHD (attention deficit hyperactivity disorder) 05/07/2012   Bipolar 1 disorder ([place]) 05/07/2012    RPCP: Charlott Rakes, MD   REFERRING PROVIDER: Charlott Rakes, MD   REFERRING DIAG: M79.10 (ICD-10-CM) - Myalgia   THERAPY DIAG:  Myalgia   Muscle  weakness (generalized)   Difficulty in walking, not elsewhere classified   ONSET DATE: >84yr   SUBJECTIVE:                                                                                                                                                                                            SUBJECTIVE STATEMENT: "Sore again today lb, hips and shoulders" PERTINENT HISTORY:  Irritable bowel syndrome with both constipation and diarrhea          Moderate protein-calorie malnutrition bipolar disorder, anxiety, depression, GERD, IBS with constipation, status post laparoscopic TAHBSO due to abnormal uterine bleed    currently on Premarin by GYN) She was hospitalized for pyelonephritis from 01/17/2022 through 01/21/2022. PAIN:  Are you having pain? Yes: NPRS scale: 8/10 general Pain location: cerv spine, left shoulder and arm, LB, hips Pain description: ache Aggravating factors: overdoing although tolerance levels vary Relieving factors: rest     PRECAUTIONS: Other: being [facility]/hypermobile joints   WEIGHT BEARING RESTRICTIONS No   FALLS:  Has patient fallen in last 6 months? No   LIVING ENVIRONMENT: Lives with: lives with their family Lives in: House/apartment Stairs: Yes: Internal: 1 flight steps; yes     OCCUPATION: unable to work   PLOF: Independent with basic ADLs   PATIENT GOALS gain strength, decrease pain     OBJECTIVE:    COGNITION:           Overall cognitive status:  WFL although pt reports bouts of brain fog                                  SENSATION: Numbness and tingling LUE into cervical spine intermittent Occasional numbness and tingling in LE   MUSCLE LENGTH: Connective tissue disorder probable/hypermobile   POSTURE:  Very slight figure, slightly rounded shoulders       ROM throughout wfl Strength UE 3+/5 LE 3/5  05/24/22 UE 4-/5  LE 4-/5     FUNCTIONAL TESTS:  5 times sit to stand: 38 Timed up and go (TUG):  18 Berg Balance Scale: 21/56  05/24/22 5 x STS: 33s TUG: 15 Berg: 37/56   GAIT: Distance walked: 300 Assistive device utilized: None Level of assistance: Complete Independence Comments: decreased stride length. Upon initiation of gait cadence slowed       TODAY'S TREATMENT   Pt seen for aquatic therapy today.  Treatment took place in water 3.25-4.8 ft in depth at the [facility] pool. Temp of water was 91.  Pt entered/exited the pool via stairs step through pattern independently with bilat rail.  Walking with and without support forward, back and sidestepping x4 widths each Sitting on noodle: hip hiking; ant/post pelvic tilts; Rotation.  Warrior 1 x5 Warrior II x5 Warrior II x5 Seated adductor squeeze using BB 5x10s hold STS from 3rd step x5 with iso add using BB Standing in 4 ft 1/4 noodle (noodle that is cut) pull down to knees x10 Kick board push pull feet together x10 then staggered leading R/L x 10 @ 10-12-2   Pt requires buoyancy for support and to offload joints with strengthening exercises. Viscosity of the water is needed for resistance of strengthening; water current perturbations provides challenge to standing balance unsupported, requiring increased core activation.      PATIENT EDUCATION:  Education details: Propertires of water, benefit of aquatic therapy Person educated:  pt and mother Education method: Explanation Education comprehension: verbalized understanding     HOME EXERCISE PROGRAM: Aquatic HEP to be assigned   ASSESSMENT:   CLINICAL IMPRESSION: Pt with CAT scan of cervical spine scheduled for today due to recurrent nerve sx in LUE.  She engages with session without increase pain although does fatigue quickly requiring rest periods throughout.  Added balance challenges with warrior position which also engaged core rotators.  Requires demonstration for execution and supervision throughout monitoring for fatigue and  pain.       Patient is a 36 y.o. F who was seen today for physical therapy evaluation and treatment for myalgia. She is also being seen for pelvic floor rehab at [facility] ([place]). Pt has had hx of pain sx potentially due to a connective tissue disorder. She presents frail, slight frame ~ 97lbs.  Gastroparesis inhibiting improved eating patterns.  Pt not working. Good days she may be able to walk for 30 minutes, bad days in bed.  She will benefit from skilled aquatic PT to improve strength, toleration to activity and enhance motility of gut through movement using properties of water to facilitate progression towards goals decreasing external factors.     OBJECTIVE IMPAIRMENTS decreased activity tolerance, decreased balance, decreased endurance, decreased mobility, and decreased strength.    ACTIVITY LIMITATIONS cleaning, community activity, driving, meal prep, occupation, laundry, yard work, and shopping.    PERSONAL FACTORS Age, Behavior pattern, and 1-2 comorbidities: IBS, malnutrition  are also affecting patient's functional outcome.      REHAB POTENTIAL: fair   CLINICAL DECISION MAKING: Evolving/moderate complexity   EVALUATION COMPLEXITY: Moderate     GOALS: Goals reviewed with patient? Yes   SHORT TERM GOALS: Target date: 04/04/2022   Pt will tolerate >30 minute aquatic session with rest periods as needed to demonstrate improved toleration to activity Baseline:Not yet completed Goal status: Achieved   2.  Pt will be indep with initial aquatic HEP with access to a pool Baseline: no HEP Goal status: ongoing no access to outside pool yet  3.  Pt will improve on LE strength up 1/2 to 1 full grade Baseline:3/5 Goal status: ongoing     LONG TERM GOALS: Target date: 08/07/2022   Pt will improve on Berg Balance test to >or= 35/56 to demonstrate improved balance and decreased fall risk Baseline: 21/56 Goal status: Met   2.  Pt will improve on 5xSTS test to <25s Baseline:  38 Goal status: onging   3.  Pt will gain up to 2lb to improve health by increasing appetite through increased movement Baseline: 97 lbs Goal status:Achieved   4.  Pt will be indep with final HEP with knowledge how to adjust according to her day to day ability to tolerate Baseline: no HEP Goal status: INITIAL         PLAN: PT FREQUENCY: 1x/week   PT DURATION: 8 weeks   PLANNED INTERVENTIONS: Therapeutic exercises, Therapeutic activity, Neuromuscular re-education, Balance training, Gait training, Patient/Family education, Joint mobilization, Stair training, Aquatic Therapy, and Taping.   PLAN FOR NEXT SESSION: Add adductor sets with buoyancy ball to overlap pelvic floor exercises with strengthening of LE in aquatics  Adeja Sarratt (Frankie) Ailed Defibaugh MPT 05/31/2022, 2:03 PM

## 2022-06-01 ENCOUNTER — Telehealth: Payer: Self-pay

## 2022-06-01 NOTE — Patient Instructions (Signed)
Visit Information  Ms. Rachel [name]  - as a part of your Medicaid benefit, you are eligible for care management and care coordination services at no cost or copay. I was unable to reach you by phone today but would be happy to help you with your health related needs. Please feel free to call me @ [phone].   A member of the Managed Medicaid care management team will reach out to you again over the next 7 days.   Mickel Fuchs, BSW, [place] Managed Medicaid Team  [phone]

## 2022-06-01 NOTE — Patient Outreach (Signed)
Care Coordination  06/01/2022  [name] 09/22/1986 [id number]   Medicaid Managed Care   Unsuccessful Outreach Note  06/01/2022 Name: Rachel [name] MRN: [id number] DOB: Dec 02, 1985  Referred by: Charlott Rakes, MD Reason for referral : High Risk Managed Medicaid ([employer])   An unsuccessful telephone outreach was attempted today. The patient was referred to the case management team for assistance with care management and care coordination.   Follow Up Plan: The care management team will reach out to the patient again over the next 7 days.   Mickel Fuchs, BSW, [place] Managed Medicaid Team  [phone]

## 2022-06-06 DIAGNOSIS — H1032 Unspecified acute conjunctivitis, left eye: Secondary | ICD-10-CM | POA: Diagnosis not present

## 2022-06-07 ENCOUNTER — Encounter (HOSPITAL_BASED_OUTPATIENT_CLINIC_OR_DEPARTMENT_OTHER): Payer: Self-pay | Admitting: Physical Therapy

## 2022-06-07 ENCOUNTER — Ambulatory Visit (HOSPITAL_BASED_OUTPATIENT_CLINIC_OR_DEPARTMENT_OTHER): Payer: Medicaid Other | Admitting: Physical Therapy

## 2022-06-07 DIAGNOSIS — M6281 Muscle weakness (generalized): Secondary | ICD-10-CM | POA: Diagnosis not present

## 2022-06-07 DIAGNOSIS — M791 Myalgia, unspecified site: Secondary | ICD-10-CM | POA: Diagnosis not present

## 2022-06-07 DIAGNOSIS — R279 Unspecified lack of coordination: Secondary | ICD-10-CM | POA: Diagnosis not present

## 2022-06-07 NOTE — Therapy (Signed)
OUTPATIENT PHYSICAL THERAPY TREATMENT NOTE      Patient Name: Rachel [name] MRN: [id number] DOB:17-Mar-1986, 36 y.o., female Today's Date: 06/07/2022   END OF SESSION:   PT End of Session - 06/07/22 1128     Visit Number 9    Number of Visits 12    Date for PT Re-Evaluation 08/07/22    Authorization Type medicaid healthy blue    Authorization Time Period 6/28 - 8/26 6 visits Aquatics    Progress Note Due on Visit 17    PT Start Time 1120    PT Stop Time 1200    PT Time Calculation (min) 40 min    Activity Tolerance Patient tolerated treatment well;Other (comment)    Behavior During Therapy WFL for tasks assessed/performed                 Past Medical History:  Diagnosis Date   ADHD (attention deficit hyperactivity disorder)    Anemia    Anxiety    Asthma    Bipolar disorder ([place])    Complication of anesthesia    slow to wake up    Delayed gastric emptying 02/23/2020   Depression    DUB (dysfunctional uterine bleeding) 02/23/2020   Ehlers-Danlos syndrome    Endometriosis    Gastroparesis    GERD (gastroesophageal reflux disease)    Heart murmur    Hiatal hernia 04/06/2020   Myalgia 04/06/2020   Parasitic infection 02/23/2020   Pneumonia    Poor appetite 02/23/2020   RUQ pain 02/23/2020   Vitamin D deficiency    Weight loss 02/23/2020   Past Surgical History:  Procedure Laterality Date   barium study     COLONOSCOPY     CYSTOSCOPY N/A 07/12/2021   Procedure: CYSTOSCOPY;  Surgeon: [name], MD;  Location: [place];  Service: Gynecology;  Laterality: N/A;   ESOPHAGOGASTRODUODENOSCOPY  2011   and a ph study as well.    LAPAROSCOPY N/A 01/26/2021   Procedure: LAPAROSCOPY DIAGNOSTIC;  Surgeon: [name], MD;  Location: [place];  Service: Gynecology;  Laterality: N/A;   NASAL SEPTUM SURGERY     TONGUE FLAP     TOTAL LAPAROSCOPIC HYSTERECTOMY WITH SALPINGECTOMY Bilateral 07/12/2021   Procedure: TOTAL LAPAROSCOPIC  HYSTERECTOMY WITH SALPINGECTOMY, OOPHORECTOMY;  Surgeon: [name], MD;  Location: [facility];  Service: Gynecology;  Laterality: Bilateral;   UPPER GASTROINTESTINAL ENDOSCOPY     WISDOM TOOTH EXTRACTION     Patient Active Problem List   Diagnosis Date Noted   Iron deficiency anemia 01/20/2022   Acute hyponatremia 01/18/2022   Pyelonephritis 01/18/2022   Protein-calorie malnutrition, severe 01/18/2022   sepsis secondary to pyelonephritis of left kidney 01/17/2022   Sepsis ([place]) 01/17/2022   Chronic abdominal pain 01/17/2022   Dyspnea 01/17/2022   Underweight 01/17/2022   Irritable bowel syndrome (IBS) 01/17/2022   Hypokalemia 01/17/2022   Vasomotor symptoms due to menopause 08/17/2021   S/P laparoscopic hysterectomy 07/12/2021   Preoperative exam for gynecologic surgery 06/20/2021   Adverse drug reaction 06/01/2021   Draining postoperative wound 02/28/2021   Encounter for postoperative care 02/16/2021   Endometriosis determined by laparoscopy    Pelvic pain in female 10/11/2020   Presence of subdermal contraceptive implant 10/11/2020   Dysmenorrhea 10/11/2020   Dyspareunia in female 10/11/2020   NSVT (nonsustained ventricular tachycardia) ([place]) 09/03/2020   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   Palpitations 07/26/2020   Family history of connective tissue disease 07/26/2020   Myalgia 04/06/2020   Hiatal  hernia 04/06/2020   Parasitic infection 02/23/2020   Weight loss 02/23/2020   Poor appetite 02/23/2020   RUQ pain 02/23/2020   Delayed gastric emptying 02/23/2020   DUB (dysfunctional uterine bleeding) 02/23/2020   Cervical radiculopathy 05/31/2018   Hyperalgesia 05/31/2018   Acid reflux 12/26/2017   ADHD (attention deficit hyperactivity disorder) 05/07/2012   Bipolar 1 disorder ([place]) 05/07/2012    RPCP: Charlott Rakes, MD   REFERRING PROVIDER: Charlott Rakes, MD   REFERRING DIAG: M79.10 (ICD-10-CM) - Myalgia   THERAPY DIAG:  Myalgia   Muscle weakness  (generalized)   Difficulty in walking, not elsewhere classified   ONSET DATE: >50yr   SUBJECTIVE:                                                                                                                                                                                            SUBJECTIVE STATEMENT: "Normal pain, having a pretty good day" PERTINENT HISTORY:  Irritable bowel syndrome with both constipation and diarrhea          Moderate protein-calorie malnutrition bipolar disorder, anxiety, depression, GERD, IBS with constipation, status post laparoscopic TAHBSO due to abnormal uterine bleed    currently on Premarin by GYN) She was hospitalized for pyelonephritis from 01/17/2022 through 01/21/2022. PAIN:  Are you having pain? Yes: NPRS scale: 5/10 general Pain location: cerv spine, left shoulder and arm, LB, hips Pain description: ache Aggravating factors: overdoing although tolerance levels vary Relieving factors: rest     PRECAUTIONS: Other: being tested [facility]/hypermobile joints   WEIGHT BEARING RESTRICTIONS No   FALLS:  Has patient fallen in last 6 months? No   LIVING ENVIRONMENT: Lives with: lives with their family Lives in: House/apartment Stairs: Yes: Internal: 1 flight steps; yes     OCCUPATION: unable to work   PLOF: Independent with basic ADLs   PATIENT GOALS gain strength, decrease pain     OBJECTIVE:    COGNITION:           Overall cognitive status:  WFL although pt reports bouts of brain fog                                  SENSATION: Numbness and tingling LUE into cervical spine intermittent Occasional numbness and tingling in LE   MUSCLE LENGTH: Connective tissue disorder probable/hypermobile   POSTURE:  Very slight figure, slightly rounded shoulders       ROM throughout wfl Strength UE 3+/5 LE 3/5  05/24/22 UE 4-/5  LE 4-/5     FUNCTIONAL TESTS:  5 times sit to stand: 38 Timed up and go (TUG): 18 Berg  Balance Scale: 21/56  05/24/22 5 x STS: 33s TUG: 15 Berg: 37/56   GAIT: Distance walked: 300 Assistive device utilized: None Level of assistance: Complete Independence Comments: decreased stride length. Upon initiation of gait cadence slowed       TODAY'S TREATMENT   Pt seen for aquatic therapy today.  Treatment took place in water 3.25-4.8 ft in depth at the [facility] pool. Temp of water was 91.  Pt entered/exited the pool via stairs step through pattern independently with bilat rail.  Walking with and without support forward, back and sidestepping x4 widths each Sitting on noodle: hip hiking; ant/post pelvic tilts; Rotation.  *Seated adductor squeeze using BB 5x10s hold *STS from 3rd step x5 with iso add using BB *Standing in 4 ft -/4 noodle (noodle that is cut) pull down to knees 2x10 -Lumbar rotation x 10 *Prone plank on step with hip extension and glut contraction x5 R/L *On bottom step CKC TKE R/L x 10 *seated on squoodle pelvic ROM  Pt requires buoyancy for support and to offload joints with strengthening exercises. Viscosity of the water is needed for resistance of strengthening; water current perturbations provides challenge to standing balance unsupported, requiring increased core activation.      PATIENT EDUCATION:  Education details: Propertires of water, benefit of aquatic therapy Person educated:  pt and mother Education method: Explanation Education comprehension: verbalized understanding     HOME EXERCISE PROGRAM: Aquatic HEP to be assigned   ASSESSMENT:   CLINICAL IMPRESSION: Scan reports Normal CT of neck. Advanced proximal strengthening tolerated fair.  Does flare LB and hip discomfort slightly. Minor cues for execution, posture. Will continue working on core/proximal strength and balance. She verbalizes good understanding of management of condition improving quality of life. Pt progressing well towards goals.          Patient is  a 36 y.o. F who was seen today for physical therapy evaluation and treatment for myalgia. She is also being seen for pelvic floor rehab at [facility] ([place]). Pt has had hx of pain sx potentially due to a connective tissue disorder. She presents frail, slight frame ~ 97lbs.  Gastroparesis inhibiting improved eating patterns.  Pt not working. Good days she may be able to walk for 30 minutes, bad days in bed.  She will benefit from skilled aquatic PT to improve strength, toleration to activity and enhance motility of gut through movement using properties of water to facilitate progression towards goals decreasing external factors.     OBJECTIVE IMPAIRMENTS decreased activity tolerance, decreased balance, decreased endurance, decreased mobility, and decreased strength.    ACTIVITY LIMITATIONS cleaning, community activity, driving, meal prep, occupation, laundry, yard work, and shopping.    PERSONAL FACTORS Age, Behavior pattern, and 1-2 comorbidities: IBS, malnutrition  are also affecting patient's functional outcome.      REHAB POTENTIAL: fair   CLINICAL DECISION MAKING: Evolving/moderate complexity   EVALUATION COMPLEXITY: Moderate     GOALS: Goals reviewed with patient? Yes   SHORT TERM GOALS: Target date: 04/04/2022   Pt will tolerate >30 minute aquatic session with rest periods as needed to demonstrate improved toleration to activity Baseline:Not yet completed Goal status: Achieved   2.  Pt will be indep with initial aquatic HEP with access to a pool Baseline: no HEP Goal status: ongoing no access to outside pool yet   3.  Pt will improve on LE strength up 1/2 to 1 full grade Baseline:3/5 Goal status: ongoing     LONG TERM GOALS: Target date: 08/07/2022   Pt will improve on Berg Balance test to >or= 35/56 to demonstrate improved balance and decreased fall risk Baseline: 21/56 Goal status: Met   2.  Pt will improve on 5xSTS test to <25s Baseline: 38 Goal status: onging   3.   Pt will gain up to 2lb to improve health by increasing appetite through increased movement Baseline: 97 lbs Goal status:Achieved   4.  Pt will be indep with final HEP with knowledge how to adjust according to her day to day ability to tolerate Baseline: no HEP Goal status: INITIAL         PLAN: PT FREQUENCY: 1x/week   PT DURATION: 8 weeks   PLANNED INTERVENTIONS: Therapeutic exercises, Therapeutic activity, Neuromuscular re-education, Balance training, Gait training, Patient/Family education, Joint mobilization, Stair training, Aquatic Therapy, and Taping.   PLAN FOR NEXT SESSION: core strength with balance challenges/ proximal strengthening. Add adductor sets with buoyancy ball to overlap pelvic floor exercises with strengthening of LE in aquatics  Hardin (Frankie) Bliss Tsang MPT 06/07/2022, 1:29 PM

## 2022-06-12 NOTE — BH Specialist Note (Addendum)
Integrated Behavioral Health via Telemedicine Visit  06/12/2022 Talisha Erby [id number]  Number of [place] Clinician visits: Additional Visit  Session Start time: 2878   Session End time: 1200  Total time in minutes: 72   Referring Provider: Lynnda Shields, MD Patient/Family location: Home*** [facility] Provider location: Center for [place] [facility]  All persons participating in visit: [name] and [place] ***  Types of Service: {CHL AMB TYPE OF SERVICE:[phone]}  I connected with [name] and/or [place]  via  Telephone or Video Enabled Telemedicine Application  (Video is Caregility application) and verified that I am speaking with the correct person using two identifiers. Discussed confidentiality: Yes   I discussed the limitations of telemedicine and the availability of in person appointments.  Discussed there is a possibility of technology failure and discussed alternative modes of communication if that failure occurs.  I discussed that engaging in this telemedicine visit, they consent to the provision of behavioral healthcare and the services will be billed under their insurance.  Patient and/or legal guardian expressed understanding and consented to Telemedicine visit: Yes   Presenting Concerns: Patient and/or family reports the following symptoms/concerns: *** Duration of problem: ***; Severity of problem: {Mild/Moderate/Severe:20260}  Patient and/or Family's Strengths/Protective Factors: {CHL AMB BH PROTECTIVE FACTORS:[phone]}  Goals Addressed: Patient will:  Reduce symptoms of: {IBH Symptoms:21014056}   Increase knowledge and/or ability of: {IBH Patient Tools:21014057}   Demonstrate ability to: {IBH Goals:21014053}  Progress towards Goals: Ongoing  Interventions: Interventions utilized:  {IBH Interventions:21014054} Standardized  Assessments completed: {IBH Screening Tools:21014051}  Patient and/or Family Response: Patient agrees with treatment plan.   Assessment: Patient currently experiencing ADHD, PTSD; Psychosocial stress ***  Patient may benefit from continued therapeutic interventions.  Plan: Follow up with behavioral health clinician on : *** Behavioral recommendations:  -*** -*** Referral(s): {IBH Referrals:21014055}  I discussed the assessment and treatment plan with the patient and/or parent/guardian. They were provided an opportunity to ask questions and all were answered. They agreed with the plan and demonstrated an understanding of the instructions.   They were advised to call back or seek an in-person evaluation if the symptoms worsen or if the condition fails to improve as anticipated.  Caroleen Hamman Timesha Cervantez, LCSW

## 2022-06-14 ENCOUNTER — Ambulatory Visit (HOSPITAL_BASED_OUTPATIENT_CLINIC_OR_DEPARTMENT_OTHER): Payer: Medicaid Other | Admitting: Physical Therapy

## 2022-06-15 ENCOUNTER — Ambulatory Visit (INDEPENDENT_AMBULATORY_CARE_PROVIDER_SITE_OTHER): Payer: Medicaid Other | Admitting: Clinical

## 2022-06-15 DIAGNOSIS — F909 Attention-deficit hyperactivity disorder, unspecified type: Secondary | ICD-10-CM

## 2022-06-15 DIAGNOSIS — F4310 Post-traumatic stress disorder, unspecified: Secondary | ICD-10-CM

## 2022-06-15 DIAGNOSIS — Z658 Other specified problems related to psychosocial circumstances: Secondary | ICD-10-CM

## 2022-06-15 NOTE — Patient Instructions (Addendum)
Center for [facility] for Women [place], [place] [address] [phone] (main office) [phone] ([place] office)  [facility] (legal advice, etc.) [email or web address]   Legal Aid of  [email or web address]

## 2022-06-21 NOTE — BH Specialist Note (Addendum)
Integrated Behavioral Health via Telemedicine Visit  06/21/2022 [name] [id number]  Number of [place] Clinician visits: Additional Visit  Session Start time: 7616   Session End time: 0934  Total time in minutes: 66   Referring Provider: Lynnda Shields, MD Patient/Family location: [facility] Provider location: Center for [facility]  All persons participating in visit: [name] and [place]   Types of Service: Individual psychotherapy and Telephone visit  I connected with [name] and/or [place]  via  Telephone or Video Enabled Telemedicine Application  (Video is Tree surgeon) and verified that I am speaking with the correct person using two identifiers. Discussed confidentiality: Yes   I discussed the limitations of telemedicine and the availability of in person appointments.  Discussed there is a possibility of technology failure and discussed alternative modes of communication if that failure occurs.  I discussed that engaging in this telemedicine visit, they consent to the provision of behavioral healthcare and the services will be billed under their insurance.  Patient and/or legal guardian expressed understanding and consented to Telemedicine visit: Yes   Presenting Concerns: Patient and/or family reports the following symptoms/concerns: Relationship conflict Duration of problem: Ongoing; Severity of problem: moderate  Patient and/or Family's Strengths/Protective Factors: Social connections, Social and Emotional competence, Concrete supports in place (healthy food, safe environments, etc.), Sense of purpose, and Physical Health (exercise, healthy diet, medication compliance, etc.)  Goals Addressed: Patient will:  Reduce symptoms of: anxiety and stress   Increase knowledge and/or ability of: stress reduction   Demonstrate ability  to: Increase healthy adjustment to current life circumstances  Progress towards Goals: Ongoing  Interventions: Interventions utilized:  ACT (Acceptance and Commitment Therapy) Standardized Assessments completed: Not Needed  Patient and/or Family Response: Patient agrees with treatment plan.   Assessment: Patient currently experiencing ADHD, PTSD; Psychosocial stress.   Patient may benefit from continued therapeutic interventions.  Plan: Follow up with behavioral health clinician on : Two weeks Behavioral recommendations:  -Continue using self-coping strategies daily -Consider legal resources, as discussed and needed Referral(s): [place] (In Clinic)  I discussed the assessment and treatment plan with the patient and/or parent/guardian. They were provided an opportunity to ask questions and all were answered. They agreed with the plan and demonstrated an understanding of the instructions.   They were advised to call back or seek an in-person evaluation if the symptoms worsen or if the condition fails to improve as anticipated.  Caroleen Hamman Nashid Pellum, LCSW

## 2022-06-22 ENCOUNTER — Ambulatory Visit (HOSPITAL_BASED_OUTPATIENT_CLINIC_OR_DEPARTMENT_OTHER): Payer: Medicaid Other | Attending: Family Medicine | Admitting: Physical Therapy

## 2022-06-22 ENCOUNTER — Encounter (HOSPITAL_BASED_OUTPATIENT_CLINIC_OR_DEPARTMENT_OTHER): Payer: Self-pay | Admitting: Physical Therapy

## 2022-06-22 DIAGNOSIS — R279 Unspecified lack of coordination: Secondary | ICD-10-CM | POA: Insufficient documentation

## 2022-06-22 DIAGNOSIS — M6281 Muscle weakness (generalized): Secondary | ICD-10-CM | POA: Diagnosis not present

## 2022-06-22 DIAGNOSIS — M791 Myalgia, unspecified site: Secondary | ICD-10-CM | POA: Diagnosis not present

## 2022-06-22 NOTE — Therapy (Signed)
OUTPATIENT PHYSICAL THERAPY TREATMENT NOTE      Patient Name: Rachel [name] MRN: [id number] DOB:1986/08/01, 36 y.o., female Today's Date: 06/22/2022   END OF SESSION:   PT End of Session - 06/22/22 1127     Visit Number 10    Number of Visits 12    Date for PT Re-Evaluation 08/07/22    Authorization Type medicaid healthy blue    Authorization Time Period 6/28 - 8/26 6 visits [employer]    Progress Note Due on Visit 57    PT Start Time 1120    PT Stop Time 1200    PT Time Calculation (min) 40 min    Activity Tolerance Patient tolerated treatment well;Patient limited by fatigue    Behavior During Therapy [facility] for tasks assessed/performed                 Past Medical History:  Diagnosis Date   ADHD (attention deficit hyperactivity disorder)    Anemia    Anxiety    Asthma    Bipolar disorder ([place])    Complication of anesthesia    slow to wake up    Delayed gastric emptying 02/23/2020   Depression    DUB (dysfunctional uterine bleeding) 02/23/2020   Ehlers-Danlos syndrome    Endometriosis    Gastroparesis    GERD (gastroesophageal reflux disease)    Heart murmur    Hiatal hernia 04/06/2020   Myalgia 04/06/2020   Parasitic infection 02/23/2020   Pneumonia    Poor appetite 02/23/2020   RUQ pain 02/23/2020   Vitamin D deficiency    Weight loss 02/23/2020   Past Surgical History:  Procedure Laterality Date   barium study     COLONOSCOPY     CYSTOSCOPY N/A 07/12/2021   Procedure: CYSTOSCOPY;  Surgeon: [name], MD;  Location: [place];  Service: Gynecology;  Laterality: N/A;   ESOPHAGOGASTRODUODENOSCOPY  2011   and a ph study as well.    LAPAROSCOPY N/A 01/26/2021   Procedure: LAPAROSCOPY DIAGNOSTIC;  Surgeon: [name], MD;  Location: [place];  Service: Gynecology;  Laterality: N/A;   NASAL SEPTUM SURGERY     TONGUE FLAP     TOTAL LAPAROSCOPIC HYSTERECTOMY WITH SALPINGECTOMY Bilateral 07/12/2021   Procedure: TOTAL  LAPAROSCOPIC HYSTERECTOMY WITH SALPINGECTOMY, OOPHORECTOMY;  Surgeon: [name], MD;  Location: [place];  Service: Gynecology;  Laterality: Bilateral;   UPPER GASTROINTESTINAL ENDOSCOPY     WISDOM TOOTH EXTRACTION     Patient Active Problem List   Diagnosis Date Noted   Iron deficiency anemia 01/20/2022   Acute hyponatremia 01/18/2022   Pyelonephritis 01/18/2022   Protein-calorie malnutrition, severe 01/18/2022   sepsis secondary to pyelonephritis of left kidney 01/17/2022   Sepsis ([place]) 01/17/2022   Chronic abdominal pain 01/17/2022   Dyspnea 01/17/2022   Underweight 01/17/2022   Irritable bowel syndrome (IBS) 01/17/2022   Hypokalemia 01/17/2022   Vasomotor symptoms due to menopause 08/17/2021   S/P laparoscopic hysterectomy 07/12/2021   Preoperative exam for gynecologic surgery 06/20/2021   Adverse drug reaction 06/01/2021   Draining postoperative wound 02/28/2021   Encounter for postoperative care 02/16/2021   Endometriosis determined by laparoscopy    Pelvic pain in female 10/11/2020   Presence of subdermal contraceptive implant 10/11/2020   Dysmenorrhea 10/11/2020   Dyspareunia in female 10/11/2020   NSVT (nonsustained ventricular tachycardia) ([place]) 09/03/2020   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   Palpitations 07/26/2020   Family history of connective tissue disease 07/26/2020   Myalgia 04/06/2020  Hiatal hernia 04/06/2020   Parasitic infection 02/23/2020   Weight loss 02/23/2020   Poor appetite 02/23/2020   RUQ pain 02/23/2020   Delayed gastric emptying 02/23/2020   DUB (dysfunctional uterine bleeding) 02/23/2020   Cervical radiculopathy 05/31/2018   Hyperalgesia 05/31/2018   Acid reflux 12/26/2017   ADHD (attention deficit hyperactivity disorder) 05/07/2012   Bipolar 1 disorder ([place]) 05/07/2012    RPCP: Charlott Rakes, MD   REFERRING PROVIDER: Charlott Rakes, MD   REFERRING DIAG: M79.10 (ICD-10-CM) - Myalgia   THERAPY DIAG:  Myalgia   Muscle  weakness (generalized)   Difficulty in walking, not elsewhere classified   ONSET DATE: >55yr   SUBJECTIVE:                                                                                                                                                                                            SUBJECTIVE STATEMENT: "Haven't been feeling well" PERTINENT HISTORY:  Irritable bowel syndrome with both constipation and diarrhea          Moderate protein-calorie malnutrition bipolar disorder, anxiety, depression, GERD, IBS with constipation, status post laparoscopic TAHBSO due to abnormal uterine bleed    currently on Premarin by GYN) She was hospitalized for pyelonephritis from 01/17/2022 through 01/21/2022. PAIN:  Are you having pain? Yes: NPRS scale: 7/10 general Pain location: cerv spine, left shoulder and arm, LB, hips Pain description: ache Aggravating factors: overdoing although tolerance levels vary Relieving factors: rest     PRECAUTIONS: Other[facility]   WEIGHT BEARING RESTRICTIONS No   FALLS:  Has patient fallen in last 6 months? No   LIVING ENVIRONMENT: Lives with: lives with their family Lives in: House/apartment Stairs: Yes: Internal: 1 flight steps; yes     OCCUPATION: unable to work   PLOF: Independent with basic ADLs   PATIENT GOALS gain strength, decrease pain     OBJECTIVE:    COGNITION:           Overall cognitive status:  WFL although pt reports bouts of brain fog                                  SENSATION: Numbness and tingling LUE into cervical spine intermittent Occasional numbness and tingling in LE   MUSCLE LENGTH: Connective tissue disorder probable/hypermobile   POSTURE:  Very slight figure, slightly rounded shoulders       ROM throughout wfl Strength UE 3+/5 LE 3/5  05/24/22 UE 4-/5  LE 4-/5   06/22/22 UE 4-/5     LE 4-/5   Limited to pain     FUNCTIONAL TESTS:  5 times sit  to stand: 38 Timed up and go (TUG): 18 Berg Balance Scale: 21/56  05/24/22 5 x STS: 33s TUG: 15 Berg: 37/56  06/21/22 5  x STS: 39  TUG: 19 Berg: NT   GAIT: Distance walked: 300 Assistive device utilized: None Level of assistance: Complete Independence Comments: decreased stride length. Upon initiation of gait cadence slowed       TODAY'S TREATMENT   Pt seen for aquatic therapy today.  Treatment took place in water 3.25-4.8 ft in depth at the [employer] [facility] pool. Temp of water was 91.  Pt entered/exited the pool via stairs step through pattern independently with bilat rail.  Walking with and without support forward, back and sidestepping x4 widths each Sitting on noodle: hip hiking; ant/post pelvic tilts; Rotation. *Seated flutter 3x20; add/abd x 20 *Prone plank on step with hip extension and glut contraction x10 R/L Prone Plank with squoodle 2 x30s hold.  Cues for core engagement Plank with arm pull down x5 Adductor set using BB 5 x 10s *STS from 3rd step x5 with iso add using BB   Pt requires buoyancy for support and to offload joints with strengthening exercises. Viscosity of the water is needed for resistance of strengthening; water current perturbations provides challenge to standing balance unsupported, requiring increased core activation.      PATIENT EDUCATION:  Education details: Propertires of water, benefit of aquatic therapy Person educated:  pt and mother Education method: Explanation Education comprehension: verbalized understanding     HOME EXERCISE PROGRAM: Aquatic HEP to be assigned   ASSESSMENT:   CLINICAL IMPRESSION: Continued advancing core strengthening.  She does report not feeling well for past few days but tolerates session well with encouragement. Pt demonstrates improved toleration to activity completing ~ 40 min of activity on "a bad day".  Puts forth good effort. Objective testing completed: MMT; 5x STS.  Pt with no measurable  improvement today due to pain exacerbation on objective testing          Patient is a 36 y.o. F who was seen today for physical therapy evaluation and treatment for myalgia. She is also being seen for pelvic floor rehab at [place] ([facility]). Pt has had hx of pain sx potentially due to a connective tissue disorder. She presents frail, slight frame ~ 97lbs.  Gastroparesis inhibiting improved eating patterns.  Pt not working. Good days she may be able to walk for 30 minutes, bad days in bed.  She will benefit from skilled aquatic PT to improve strength, toleration to activity and enhance motility of gut through movement using properties of water to facilitate progression towards goals decreasing external factors.     OBJECTIVE IMPAIRMENTS decreased activity tolerance, decreased balance, decreased endurance, decreased mobility, and decreased strength.    ACTIVITY LIMITATIONS cleaning, community activity, driving, meal prep, occupation, laundry, yard work, and shopping.    PERSONAL FACTORS Age, Behavior pattern, and 1-2 comorbidities: IBS, malnutrition  are also affecting patient's functional outcome.      REHAB POTENTIAL: fair   CLINICAL DECISION MAKING: Evolving/moderate complexity   EVALUATION COMPLEXITY: Moderate     GOALS: Goals reviewed with patient? Yes   SHORT TERM GOALS: Target date: 04/04/2022   Pt will tolerate >30 minute aquatic session with rest periods as needed to demonstrate improved toleration to activity Baseline:Not yet completed Goal status: Achieved  2.  Pt will be indep with initial aquatic HEP with access to a pool Baseline: no HEP Goal status: ongoing no access to outside pool yet   3.  Pt will improve on LE strength up 1/2 to 1 full grade Baseline:3/5 Goal status: ongoing     LONG TERM GOALS: Target date: 08/07/2022   Pt will improve on Berg Balance test to >or= 35/56 to demonstrate improved balance and decreased fall risk Baseline: 21/56 Goal  status: Met   2.  Pt will improve on 5xSTS test to <25s Baseline: 38 Goal status: onging   3.  Pt will gain up to 2lb to improve health by increasing appetite through increased movement Baseline: 97 lbs Goal status:Achieved   4.  Pt will be indep with final HEP with knowledge how to adjust according to her day to day ability to tolerate Baseline: no HEP Goal status: INITIAL         PLAN: PT FREQUENCY: 1x/week   PT DURATION: 8 weeks   PLANNED INTERVENTIONS: Therapeutic exercises, Therapeutic activity, Neuromuscular re-education, Balance training, Gait training, Patient/Family education, Joint mobilization, Stair training, Aquatic Therapy, and Taping.   PLAN FOR NEXT SESSION: core strength with balance challenges/ proximal strengthening. Add adductor sets with buoyancy ball to overlap pelvic floor exercises with strengthening of LE in aquatics  [place] ([place]) Linder Prajapati MPT 06/22/2022, 11:31 AM

## 2022-06-28 ENCOUNTER — Ambulatory Visit (HOSPITAL_BASED_OUTPATIENT_CLINIC_OR_DEPARTMENT_OTHER): Payer: Medicaid Other | Admitting: Physical Therapy

## 2022-06-28 ENCOUNTER — Encounter (HOSPITAL_BASED_OUTPATIENT_CLINIC_OR_DEPARTMENT_OTHER): Payer: Self-pay | Admitting: Physical Therapy

## 2022-06-28 DIAGNOSIS — M6281 Muscle weakness (generalized): Secondary | ICD-10-CM

## 2022-06-28 DIAGNOSIS — R279 Unspecified lack of coordination: Secondary | ICD-10-CM | POA: Diagnosis not present

## 2022-06-28 DIAGNOSIS — M791 Myalgia, unspecified site: Secondary | ICD-10-CM

## 2022-06-28 NOTE — Therapy (Signed)
OUTPATIENT PHYSICAL THERAPY TREATMENT NOTE      Patient Name: Rachel [name] MRN: [id number] DOB:June 05, 1986, 36 y.o., female Today's Date: 06/28/2022   END OF SESSION:   PT End of Session - 06/28/22 1043     Visit Number 11    Number of Visits 12    Date for PT Re-Evaluation 08/07/22    Authorization Type medicaid healthy blue    Authorization Time Period 6/28 - 8/26 6 visits [employer]    Progress Note Due on Visit 81    PT Start Time 1035    PT Stop Time 1115    PT Time Calculation (min) 40 min    Activity Tolerance Patient limited by fatigue;Patient limited by pain    Behavior During Therapy [facility] for tasks assessed/performed                 Past Medical History:  Diagnosis Date   ADHD (attention deficit hyperactivity disorder)    Anemia    Anxiety    Asthma    Bipolar disorder ([place])    Complication of anesthesia    slow to wake up    Delayed gastric emptying 02/23/2020   Depression    DUB (dysfunctional uterine bleeding) 02/23/2020   Ehlers-Danlos syndrome    Endometriosis    Gastroparesis    GERD (gastroesophageal reflux disease)    Heart murmur    Hiatal hernia 04/06/2020   Myalgia 04/06/2020   Parasitic infection 02/23/2020   Pneumonia    Poor appetite 02/23/2020   RUQ pain 02/23/2020   Vitamin D deficiency    Weight loss 02/23/2020   Past Surgical History:  Procedure Laterality Date   barium study     COLONOSCOPY     CYSTOSCOPY N/A 07/12/2021   Procedure: CYSTOSCOPY;  Surgeon: [name], MD;  Location: [place];  Service: Gynecology;  Laterality: N/A;   ESOPHAGOGASTRODUODENOSCOPY  2011   and a ph study as well.    LAPAROSCOPY N/A 01/26/2021   Procedure: LAPAROSCOPY DIAGNOSTIC;  Surgeon: [name], MD;  Location: [place];  Service: Gynecology;  Laterality: N/A;   NASAL SEPTUM SURGERY     TONGUE FLAP     TOTAL LAPAROSCOPIC HYSTERECTOMY WITH SALPINGECTOMY Bilateral 07/12/2021   Procedure: TOTAL LAPAROSCOPIC  HYSTERECTOMY WITH SALPINGECTOMY, OOPHORECTOMY;  Surgeon: [name], MD;  Location: [place];  Service: Gynecology;  Laterality: Bilateral;   UPPER GASTROINTESTINAL ENDOSCOPY     WISDOM TOOTH EXTRACTION     Patient Active Problem List   Diagnosis Date Noted   Iron deficiency anemia 01/20/2022   Acute hyponatremia 01/18/2022   Pyelonephritis 01/18/2022   Protein-calorie malnutrition, severe 01/18/2022   sepsis secondary to pyelonephritis of left kidney 01/17/2022   Sepsis ([place]) 01/17/2022   Chronic abdominal pain 01/17/2022   Dyspnea 01/17/2022   Underweight 01/17/2022   Irritable bowel syndrome (IBS) 01/17/2022   Hypokalemia 01/17/2022   Vasomotor symptoms due to menopause 08/17/2021   S/P laparoscopic hysterectomy 07/12/2021   Preoperative exam for gynecologic surgery 06/20/2021   Adverse drug reaction 06/01/2021   Draining postoperative wound 02/28/2021   Encounter for postoperative care 02/16/2021   Endometriosis determined by laparoscopy    Pelvic pain in female 10/11/2020   Presence of subdermal contraceptive implant 10/11/2020   Dysmenorrhea 10/11/2020   Dyspareunia in female 10/11/2020   NSVT (nonsustained ventricular tachycardia) ([place]) 09/03/2020   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   Palpitations 07/26/2020   Family history of connective tissue disease 07/26/2020   Myalgia 04/06/2020  Hiatal hernia 04/06/2020   Parasitic infection 02/23/2020   Weight loss 02/23/2020   Poor appetite 02/23/2020   RUQ pain 02/23/2020   Delayed gastric emptying 02/23/2020   DUB (dysfunctional uterine bleeding) 02/23/2020   Cervical radiculopathy 05/31/2018   Hyperalgesia 05/31/2018   Acid reflux 12/26/2017   ADHD (attention deficit hyperactivity disorder) 05/07/2012   Bipolar 1 disorder ([place]) 05/07/2012    RPCP: Charlott Rakes, MD   REFERRING PROVIDER: Charlott Rakes, MD   REFERRING DIAG: M79.10 (ICD-10-CM) - Myalgia   THERAPY DIAG:  Myalgia   Muscle weakness  (generalized)   Difficulty in walking, not elsewhere classified   ONSET DATE: >39yr   SUBJECTIVE:                                                                                                                                                                                            SUBJECTIVE STATEMENT: "Pain has been higher this week" PERTINENT HISTORY:  Irritable bowel syndrome with both constipation and diarrhea          Moderate protein-calorie malnutrition bipolar disorder, anxiety, depression, GERD, IBS with constipation, status post laparoscopic TAHBSO due to abnormal uterine bleed    currently on Premarin by GYN) She was hospitalized for pyelonephritis from 01/17/2022 through 01/21/2022. PAIN:  Are you having pain? Yes: NPRS scale: 8/10 general Pain location: cerv spine, left shoulder and arm, LB, hips Pain description: ache Aggravating factors: overdoing although tolerance levels vary Relieving factors: rest     PRECAUTIONS: Other: being [facility]/hypermobile joints   WEIGHT BEARING RESTRICTIONS No   FALLS:  Has patient fallen in last 6 months? 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 Upon initiation of gait cadence slowed       TODAY'S TREATMENT   Pt seen for aquatic therapy today.  Treatment took place in water 3.25-4.8 ft in depth at the [facility] pool. Temp of water was 91.  Pt entered/exited the pool via stairs step through pattern independently with bilat rail.   Walking with and without support forward, back and sidestepping x4 widths each Sitting on noodle: hip hiking; ant/post pelvic tilts; Rotation. Seated on yellow noodle 4.70f :flutter 2x20; add/abd 2x 20; cycling ~ 2 widths Suspended supine knee flex x 5; hip extension x 5 Abdominal isometrics with 3/4 noodle blue push down x 5   Pt requires buoyancy for support and to offload joints with strengthening exercises. Viscosity of the water is needed for resistance of strengthening; water current perturbations provides challenge to standing balance unsupported, requiring increased core activation.      PATIENT EDUCATION:  Education details: Propertires of water, benefit of aquatic therapy Person educated:  pt and mother Education method: Explanation Education comprehension: verbalized understanding     HOME EXERCISE PROGRAM: Aquatic HEP to be assigned   ASSESSMENT:   CLINICAL IMPRESSION: Pt with higher than normal pain, limiting.  Focused on reduction of sx, ROM and core strengthening as tolerated. Posterior core engagement in suspended sup tolerated fair.  Finishes in jacuzzi (massage to LB and hips; unbilled).  She reports reduction in pain a few points post session. Goals ongoing        Patient is a 36y.o. F who was seen today for physical therapy evaluation and treatment for myalgia. She  is also being seen for [facility] floor re[facility] Pt has had hx of pain sx potentially due to a connective tissue disorder. She presents frail, slight frame ~ 97lbs.  Gastroparesis inhibiting improved eating patterns.  Pt not working. Good days she may be able to walk for 30 minutes, bad days in bed.  She will benefit from skilled aquatic PT to improve strength, toleration to activity and enhance motility of gut through movement using properties of water to facilitate progression towards goals decreasing external factors.     OBJECTIVE IMPAIRMENTS decreased activity tolerance, decreased balance, decreased endurance, decreased mobility, and decreased strength.    ACTIVITY LIMITATIONS cleaning, community activity, driving, meal prep, occupation, laundry, yard work, and shopping.    PERSONAL FACTORS Age, Behavior pattern, and 1-2 comorbidities: IBS, malnutrition  are also affecting patient's functional outcome.      REHAB POTENTIAL: fair   CLINICAL DECISION MAKING: Evolving/moderate complexity   EVALUATION COMPLEXITY: Moderate     GOALS: Goals reviewed with patient? Yes   SHORT TERM GOALS: Target date: 04/04/2022   Pt will tolerate >30 minute aquatic session with rest periods as needed to demonstrate improved toleration to activity Baseline:Not yet completed Goal status: Achieved   2.  Pt will be indep with initial aquatic HEP with access to a pool Baseline: no HEP Goal status: ongoing no access to outside pool yet   3.  Pt will improve on LE strength up 1/2 to 1 full grade Baseline:3/5 Goal status: ongoing     LONG TERM GOALS: Target date: 08/07/2022   Pt will improve on Berg Balance test to >or= 35/56 to demonstrate improved balance and decreased fall risk Baseline: 21/56 Goal status: Met   2.  Pt will improve on 5xSTS test to <25s Baseline: 38 Goal status: onging   3.  Pt will gain up to 2lb to improve health by increasing appetite through increased  movement Baseline: 97 lbs Goal status:Achieved   4.  Pt will be indep with final HEP with knowledge how to adjust according to her day to day ability to tolerate Baseline: no HEP Goal status: INITIAL         PLAN: PT FREQUENCY: 1x/week   PT DURATION: 8 weeks   PLANNED INTERVENTIONS: Therapeutic exercises, Therapeutic activity, Neuromuscular re-education, Balance training, Gait training, Patient/Family education, Joint mobilization, Stair training, Aquatic Therapy, and Taping.   PLAN FOR NEXT SESSION: core strength with balance challenges/ proximal strengthening. Add adductor sets with buoyancy ball to overlap pelvic floor exercises with strengthening of LE in aquatics  Partick Musselman (Frankie) Boomer Winders MPT 06/28/2022, 11:12 AM

## 2022-07-05 ENCOUNTER — Ambulatory Visit (INDEPENDENT_AMBULATORY_CARE_PROVIDER_SITE_OTHER): Payer: Medicaid Other | Admitting: Clinical

## 2022-07-05 DIAGNOSIS — F909 Attention-deficit hyperactivity disorder, unspecified type: Secondary | ICD-10-CM

## 2022-07-05 DIAGNOSIS — F4310 Post-traumatic stress disorder, unspecified: Secondary | ICD-10-CM

## 2022-07-10 NOTE — BH Specialist Note (Addendum)
Integrated Behavioral Health via Telemedicine Visit  07/20/2022 Rachel [name] [id number]  Number of [place] Clinician visits: Additional Visit  Session Start time: 8416   Session End time: 6063  Total time in minutes: 28   Referring Provider: Lynnda Shields, MD Patient/Family location: Home [facility] Provider location: [facility]  All persons participating in visit: [name] and Rachel [name]   Types of Service: Individual psychotherapy and Telephone visit  I connected with [name] and/or [place]  via  Telephone or Video Enabled Telemedicine Application  (Video is Tree surgeon) and verified that I am speaking with the correct person using two identifiers. Discussed confidentiality: Yes   I discussed the limitations of telemedicine and the availability of in person appointments.  Discussed there is a possibility of technology failure and discussed alternative modes of communication if that failure occurs.  I discussed that engaging in this telemedicine visit, they consent to the provision of behavioral healthcare and the services will be billed under their insurance.  Patient and/or legal guardian expressed understanding and consented to Telemedicine visit: Yes   Presenting Concerns: Patient and/or family reports the following symptoms/concerns: Worry over having either covid or flu today; relationship conflict Duration of problem: Ongoing; Severity of problem: moderate  Patient and/or Family's Strengths/Protective Factors: Social connections, Social and Emotional competence, Concrete supports in place (healthy food, safe environments, etc.), Sense of purpose, and Physical Health (exercise, healthy diet, medication compliance, etc.)  Goals Addressed: Patient will:  Reduce symptoms of: anxiety and stress   Progress towards  Goals: Ongoing  Interventions: Interventions utilized:  ACT (Acceptance and Commitment Therapy) Standardized Assessments completed: Not Needed  Patient and/or Family Response: Patient agrees with treatment plan.   Assessment: Patient currently experiencing ADHD, PTSD, Psychosocial stress.   Patient may benefit from continued therapeutic interventions .  Plan: Follow up with behavioral health clinician on : Two weeks Behavioral recommendations:  -Continue daily self-coping strategies as needed -Contact PCP for medical advice about current sickness Referral(s): [facility])  I discussed the assessment and treatment plan with the patient and/or parent/guardian. They were provided an opportunity to ask questions and all were answered. They agreed with the plan and demonstrated an understanding of the instructions.   They were advised to call back or seek an in-person evaluation if the symptoms worsen or if the condition fails to improve as anticipated.  Caroleen Hamman Jaryiah Mehlman, LCSW

## 2022-07-12 ENCOUNTER — Ambulatory Visit (HOSPITAL_BASED_OUTPATIENT_CLINIC_OR_DEPARTMENT_OTHER): Payer: Medicaid Other | Admitting: Physical Therapy

## 2022-07-12 ENCOUNTER — Encounter (HOSPITAL_BASED_OUTPATIENT_CLINIC_OR_DEPARTMENT_OTHER): Payer: Self-pay | Admitting: Physical Therapy

## 2022-07-12 DIAGNOSIS — M791 Myalgia, unspecified site: Secondary | ICD-10-CM | POA: Diagnosis not present

## 2022-07-12 DIAGNOSIS — R279 Unspecified lack of coordination: Secondary | ICD-10-CM | POA: Diagnosis not present

## 2022-07-12 DIAGNOSIS — M6281 Muscle weakness (generalized): Secondary | ICD-10-CM | POA: Diagnosis not present

## 2022-07-12 NOTE — Therapy (Signed)
OUTPATIENT PHYSICAL THERAPY TREATMENT NOTE      Patient Name: Rachel [name] MRN: [id number] DOB:04/22/1986, 36 y.o., female Today's Date: 07/12/2022   END OF SESSION:   PT End of Session - 07/12/22 1207     Visit Number 12    Number of Visits 12    Date for PT Re-Evaluation 08/07/22    Authorization Type medicaid healthy [employer]    Authorization Time Period 6/28 - 8/26 6 visits [employer]    Progress Note Due on Visit 17    PT Start Time 2330    PT Stop Time 1239    PT Time Calculation (min) 38 min    Activity Tolerance Patient limited by fatigue;Patient limited by pain    Behavior During Therapy [facility] for tasks assessed/performed                 Past Medical History:  Diagnosis Date   ADHD (attention deficit hyperactivity disorder)    Anemia    Anxiety    Asthma    Bipolar disorder ([place])    Complication of anesthesia    slow to wake up    Delayed gastric emptying 02/23/2020   Depression    DUB (dysfunctional uterine bleeding) 02/23/2020   Ehlers-Danlos syndrome    Endometriosis    Gastroparesis    GERD (gastroesophageal reflux disease)    Heart murmur    Hiatal hernia 04/06/2020   Myalgia 04/06/2020   Parasitic infection 02/23/2020   Pneumonia    Poor appetite 02/23/2020   RUQ pain 02/23/2020   Vitamin D deficiency    Weight loss 02/23/2020   Past Surgical History:  Procedure Laterality Date   barium study     COLONOSCOPY     CYSTOSCOPY N/A 07/12/2021   Procedure: CYSTOSCOPY;  Surgeon: [name], MD;  Location: [place];  Service: Gynecology;  Laterality: N/A;   ESOPHAGOGASTRODUODENOSCOPY  2011   and a ph study as well.    LAPAROSCOPY N/A 01/26/2021   Procedure: LAPAROSCOPY DIAGNOSTIC;  Surgeon: [name], MD;  Location: [place];  Service: Gynecology;  Laterality: N/A;   NASAL SEPTUM SURGERY     TONGUE FLAP     TOTAL LAPAROSCOPIC HYSTERECTOMY WITH SALPINGECTOMY Bilateral 07/12/2021   Procedure: TOTAL LAPAROSCOPIC  HYSTERECTOMY WITH SALPINGECTOMY, OOPHORECTOMY;  Surgeon: [name], MD;  Location: [place];  Service: Gynecology;  Laterality: Bilateral;   UPPER GASTROINTESTINAL ENDOSCOPY     WISDOM TOOTH EXTRACTION     Patient Active Problem List   Diagnosis Date Noted   Iron deficiency anemia 01/20/2022   Acute hyponatremia 01/18/2022   Pyelonephritis 01/18/2022   Protein-calorie malnutrition, severe 01/18/2022   sepsis secondary to pyelonephritis of left kidney 01/17/2022   Sepsis ([place]) 01/17/2022   Chronic abdominal pain 01/17/2022   Dyspnea 01/17/2022   Underweight 01/17/2022   Irritable bowel syndrome (IBS) 01/17/2022   Hypokalemia 01/17/2022   Vasomotor symptoms due to menopause 08/17/2021   S/P laparoscopic hysterectomy 07/12/2021   Preoperative exam for gynecologic surgery 06/20/2021   Adverse drug reaction 06/01/2021   Draining postoperative wound 02/28/2021   Encounter for postoperative care 02/16/2021   Endometriosis determined by laparoscopy    Pelvic pain in female 10/11/2020   Presence of subdermal contraceptive implant 10/11/2020   Dysmenorrhea 10/11/2020   Dyspareunia in female 10/11/2020   NSVT (nonsustained ventricular tachycardia) ([place]) 09/03/2020   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   Palpitations 07/26/2020   Family history of connective tissue disease 07/26/2020   Myalgia 04/06/2020  Hiatal hernia 04/06/2020   Parasitic infection 02/23/2020   Weight loss 02/23/2020   Poor appetite 02/23/2020   RUQ pain 02/23/2020   Delayed gastric emptying 02/23/2020   DUB (dysfunctional uterine bleeding) 02/23/2020   Cervical radiculopathy 05/31/2018   Hyperalgesia 05/31/2018   Acid reflux 12/26/2017   ADHD (attention deficit hyperactivity disorder) 05/07/2012   Bipolar 1 disorder ([place]) 05/07/2012    RPCP: Charlott Rakes, MD   REFERRING PROVIDER: Charlott Rakes, MD   REFERRING DIAG: M79.10 (ICD-10-CM) - Myalgia   THERAPY DIAG:  Myalgia   Muscle weakness  (generalized)   Difficulty in walking, not elsewhere classified   ONSET DATE: >54yr   SUBJECTIVE:                                                                                                                                                                                            SUBJECTIVE STATEMENT: "Bad couple of weeks, couldn't get out of bed for a few days.  Real stiff everywhere" PERTINENT HISTORY:  Irritable bowel syndrome with both constipation and diarrhea          Moderate protein-calorie malnutrition bipolar disorder, anxiety, depression, GERD, IBS with constipation, status post laparoscopic TAHBSO due to abnormal uterine bleed    currently on Premarin by GYN) She was hospitalized for pyelonephritis from 01/17/2022 through 01/21/2022. PAIN:  Are you having pain? Yes: NPRS scale: 7/10 general Pain location: cerv spine, left shoulder and arm, LB, hips Pain description: ache Aggravating factors: overdoing although tolerance levels vary Relieving factors: rest     PRECAUTIONS: Other[facility]mobile [facility]   WEIGHT BEARING RESTRICTIONS No   FALLS:  Has patient fallen in last 6 months? No   LIVING ENVIRONMENT: Lives with: lives with their family Lives in: House/apartment Stairs: Yes: Internal: 1 flight steps; yes     OCCUPATION: unable to work   PLOF: Independent with basic ADLs   PATIENT GOALS gain strength, decrease pain     OBJECTIVE:    COGNITION:           Overall cognitive status:  WFL although pt reports bouts of brain fog                                  SENSATION: Numbness and tingling LUE into cervical spine intermittent Occasional numbness and tingling in LE   MUSCLE LENGTH: Connective tissue disorder probable/hypermobile   POSTURE:  Very slight figure, slightly rounded shoulders       ROM throughout wfl Strength UE 3+/5 LE 3/5  05/24/22 UE 4-/5  LE 4-/5   06/22/22 UE 4-/5     LE  4-/5   Limited to pain     FUNCTIONAL TESTS:  5 times sit to stand: 38 Timed up and go (TUG): 18 Berg Balance Scale: 21/56  05/24/22 5 x STS: 33s TUG: 15 Berg: 37/56  06/21/22 5  x STS: 39  TUG: 19 Berg: NT   GAIT: Distance walked: 300 Assistive device utilized: None Level of assistance: Complete Independence Comments: decreased stride length. Upon initiation of gait cadence slowed       TODAY'S TREATMENT   Pt seen for aquatic therapy today.  Treatment took place in water 3.25-4.8 ft in depth at the [facility] pool. Temp of water was 91.  Pt entered/exited the pool via stairs step through pattern independently with bilat rail.   Walking with and without support forward, back and sidestepping x4 widths each Sitting on noodle: hip hiking; ant/post pelvic tilts; Rotation. L  stretch x 2 30s hold Plank on bench with hip extension Abdominal isometrics with 3/4 noodle blue push down 2 x 5 Seated on yellow noodle 4.68f arms extended for added core engagement :flutter 2x20; add/abd 2x 15; cycling ~ 6 widths Suspended supine with yellow noodle under arms; hip extension x 5; core rotation knees bent x 5   Pt requires buoyancy for support and to offload joints with strengthening exercises. Viscosity of the water is needed for resistance of strengthening; water current perturbations provides challenge to standing balance unsupported, requiring increased core activation.      PATIENT EDUCATION:  Education details: Propertires of water, benefit of aquatic therapy Person educated:  pt and mother Education method: Explanation Education comprehension: verbalized understanding     HOME EXERCISE PROGRAM: Aquatic HEP to be assigned   ASSESSMENT:   CLINICAL IMPRESSION: Pt has  been in a flare for past month with increased pain and fatigue. Pain today slightly decreased from last session over 2 weeks ago.  Has had a few days in bed since. Weight is slightly up (good). She  tolerates 35 mins of activity with short rest periods well. She completes all exercises with min cues and moderate encouragement.  Improving strength and overall toleration to activity with and without fatigue as evidenced by completing therapy sessions regardless of pain/fatigue level. She also reports toleration of 1 hour ~ standing and or walking toleration up from 30 mins at initial eval. Decided mutually to end at that point for conservation of energy.          Patient is a 36y.o. F who was seen today for physical therapy evaluation and treatment for myalgia. She is also being seen for [facility] floor rehab at [facility] Pt has had hx of pain sx potentially due to a connective tissue disorder. She presents frail, slight frame ~ 97lbs.  Gastroparesis inhibiting improved eating patterns.  Pt not working. Good days she may be able to walk for 30 minutes, bad days in bed.  She will benefit from skilled aquatic PT to improve strength, toleration to activity and enhance motility of gut through movement using properties of water to facilitate progression towards goals decreasing external factors.     OBJECTIVE IMPAIRMENTS decreased activity tolerance, decreased balance, decreased endurance, decreased mobility, and decreased strength.    ACTIVITY LIMITATIONS cleaning, community activity, driving, meal prep, occupation, laundry, yard work, and shopping.    PERSONAL FACTORS Age, Behavior pattern, and 1-2 comorbidities: IBS, malnutrition  are also affecting patient's functional outcome.  REHAB POTENTIAL: fair   CLINICAL DECISION MAKING: Evolving/moderate complexity   EVALUATION COMPLEXITY: Moderate     GOALS: Goals reviewed with patient? Yes   SHORT TERM GOALS: Target date: 04/04/2022   Pt will tolerate >30 minute aquatic session with rest periods as needed to demonstrate improved toleration to activity Baseline:Not yet completed Goal status: Achieved   2.  Pt will be indep with  initial aquatic HEP with access to a pool Baseline: no HEP Goal status: ongoing no access to outside pool yet   3.  Pt will improve on LE strength up 1/2 to 1 full grade Baseline:3/5 Goal status: ongoing     LONG TERM GOALS: Target date: 08/07/2022   Pt will improve on Berg Balance test to >or= 35/56 to demonstrate improved balance and decreased fall risk Baseline: 21/56 Goal status: Met   2.  Pt will improve on 5xSTS test to <25s Baseline: 38 Goal status: onging   3.  Pt will gain up to 2lb to improve health by increasing appetite through increased movement Baseline: 97 lbs Goal status:Achieved   4.  Pt will be indep with final HEP with knowledge how to adjust according to her day to day ability to tolerate Baseline: no HEP Goal status: INITIAL         PLAN: PT FREQUENCY: 1x/week   PT DURATION: 8 weeks   PLANNED INTERVENTIONS: Therapeutic exercises, Therapeutic activity, Neuromuscular re-education, Balance training, Gait training, Patient/Family education, Joint mobilization, Stair training, Aquatic Therapy, and Taping.   PLAN FOR NEXT SESSION: core strength with balance challenges/ proximal strengthening. Add adductor sets with buoyancy ball to overlap pelvic floor exercises with strengthening of LE in aquatics  Lukas Pelcher (Frankie) Caley[place] Jr MPT 07/12/2022, 1:24 PM

## 2022-07-19 ENCOUNTER — Ambulatory Visit (HOSPITAL_BASED_OUTPATIENT_CLINIC_OR_DEPARTMENT_OTHER): Payer: Self-pay | Admitting: Physical Therapy

## 2022-07-20 ENCOUNTER — Ambulatory Visit: Payer: Medicaid Other | Admitting: Clinical

## 2022-07-20 DIAGNOSIS — F4310 Post-traumatic stress disorder, unspecified: Secondary | ICD-10-CM

## 2022-07-20 DIAGNOSIS — F909 Attention-deficit hyperactivity disorder, unspecified type: Secondary | ICD-10-CM

## 2022-07-20 DIAGNOSIS — Z658 Other specified problems related to psychosocial circumstances: Secondary | ICD-10-CM

## 2022-07-24 ENCOUNTER — Other Ambulatory Visit: Payer: Self-pay | Admitting: Gastroenterology

## 2022-07-25 ENCOUNTER — Other Ambulatory Visit: Payer: Self-pay

## 2022-07-25 ENCOUNTER — Encounter (HOSPITAL_COMMUNITY): Payer: Self-pay

## 2022-07-25 ENCOUNTER — Emergency Department (HOSPITAL_COMMUNITY): Payer: Medicaid Other

## 2022-07-25 ENCOUNTER — Emergency Department (HOSPITAL_COMMUNITY)
Admission: EM | Admit: 2022-07-25 | Discharge: 2022-07-26 | Disposition: A | Discharge status: Home / Self Care | Payer: Medicaid Other | Attending: Emergency Medicine | Admitting: Emergency Medicine

## 2022-07-25 DIAGNOSIS — R1011 Right upper quadrant pain: Secondary | ICD-10-CM | POA: Diagnosis not present

## 2022-07-25 DIAGNOSIS — R0600 Dyspnea, unspecified: Secondary | ICD-10-CM | POA: Diagnosis not present

## 2022-07-25 DIAGNOSIS — Q272 Other congenital malformations of renal artery: Secondary | ICD-10-CM | POA: Diagnosis not present

## 2022-07-25 DIAGNOSIS — R918 Other nonspecific abnormal finding of lung field: Secondary | ICD-10-CM | POA: Diagnosis not present

## 2022-07-25 DIAGNOSIS — Z90710 Acquired absence of both cervix and uterus: Secondary | ICD-10-CM | POA: Diagnosis not present

## 2022-07-25 DIAGNOSIS — J9811 Atelectasis: Secondary | ICD-10-CM | POA: Diagnosis not present

## 2022-07-25 DIAGNOSIS — Z20822 Contact with and (suspected) exposure to covid-19: Secondary | ICD-10-CM | POA: Diagnosis not present

## 2022-07-25 DIAGNOSIS — I700 Atherosclerosis of aorta: Secondary | ICD-10-CM | POA: Diagnosis not present

## 2022-07-25 DIAGNOSIS — Z79899 Other long term (current) drug therapy: Secondary | ICD-10-CM | POA: Diagnosis not present

## 2022-07-25 DIAGNOSIS — R0602 Shortness of breath: Secondary | ICD-10-CM | POA: Diagnosis not present

## 2022-07-25 MED ORDER — IPRATROPIUM-ALBUTEROL 0.5-2.5 (3) MG/3ML IN SOLN
3.0000 mL | Freq: Once | RESPIRATORY_TRACT | Status: AC
  Administered 2022-07-25: 3 mL via RESPIRATORY_TRACT

## 2022-07-25 MED ORDER — IPRATROPIUM-ALBUTEROL 0.5-2.5 (3) MG/3ML IN SOLN
RESPIRATORY_TRACT | Status: AC
  Filled 2022-07-25: qty 3

## 2022-07-25 NOTE — ED Provider Notes (Signed)
[facility] EMERGENCY DEPARTMENT Provider Note   CSN: [id number] Arrival date & time: 07/25/22  2213     History  Chief Complaint  Patient presents with   Shortness of [place] is a 36 y.o. female with a hx of gastroparesis, ehlers-danlos syndrome, endometriosis, ADHD, bipolar 1 disorder, and IBS who presents to the ED with multiple complaints over the past 2 weeks.  Patient reporting primarily respiratory complaints with shortness of breath, dry cough which has now become productive of green phlegm sputum, nasal congestion, subjective fever, chills, fatigue, nausea, as well as generalized achiness including chest and abdominal pain.  She has felt nauseous without vomiting, does have a longstanding history of gastroparesis.  Her dyspnea is worse with activity.  No alleviating factors.  She denies hemoptysis, hematemesis, melena, hematochezia, dysuria, urgency, frequency, or wheezing.  States that she typically does not wheeze with her asthma exacerbations. She states she feels similar to when she has had pyelo and required hospital admission.   HPI     Home Medications Prior to Admission medications   Medication Sig Start Date End Date Taking? Authorizing Provider  albuterol (PROVENTIL) (2.5 MG/3ML) 0.083% nebulizer solution Take 3 mLs (2.5 mg total) by nebulization every 6 (six) hours as needed for wheezing or shortness of breath. Max 4 doses per day 12/16/21   Kandra Nicolas, MD  albuterol (VENTOLIN HFA) 108 (90 Base) MCG/ACT inhaler Inhale 1-2 puffs into the lungs every 6 (six) hours as needed for wheezing or shortness of breath. 12/16/21   Kandra Nicolas, MD  Ascorbic Acid (VITAMIN C) 1000 MG tablet Take 1,000 mg by mouth 2 (two) times daily.    [provider]  B Complex Vitamins (B COMPLEX PO) Take 1 capsule by mouth daily.    [provider]  Calcium-Magnesium-Vitamin D 600-40-500 MG-MG-UNIT TB24 Take 3 tablets by mouth in  the morning and at bedtime.    [provider]  Cholecalciferol (VITAMIN D3) 50 MCG (2000 UT) TABS Take 2,000 Units by mouth daily.    [provider]  ELDERBERRY PO Take 1 tablet by mouth daily.    [provider]  EPINEPHrine 0.3 mg/0.3 mL IJ SOAJ injection Inject 0.3 mg into the muscle as needed for anaphylaxis. 05/27/21   Levin Bacon, MD  estrogens, conjugated, (PREMARIN) 0.45 MG tablet Take 1 tablet (0.45 mg total) by mouth daily. 08/17/21   [name], MD  estrogens, conjugated, (PREMARIN) 0.625 MG tablet Take 1 tablet (0.625 mg total) by mouth daily. Take daily for 21 days then do not take for 7 days. 03/24/22 03/24/23  [name], MD  Ferrous Sulfate (IRON PO) Take 1 capsule by mouth daily.    [provider]  fexofenadine (ALLEGRA) 180 MG tablet Take 180 mg by mouth daily.    [provider]  ibuprofen (ADVIL) 600 MG tablet Take 1 tablet (600 mg total) by mouth every 6 (six) hours as needed for headache, mild pain, moderate pain or cramping. 07/13/21   [name], MD  LINZESS 290 MCG CAPS capsule TAKE ONE CAPSULE BY MOUTH EVERY MORNING BEFORE BREAKFAST 07/25/22   Mauri Pole, MD  Melatonin 10 MG TABS Take 10 mg by mouth at bedtime.    [provider]  MIRALAX 17 GM/SCOOP powder Take 17 g by mouth See admin instructions. Mix 17 g of powder into water and drink once a day    [provider]  Multiple  Vitamins-Minerals (ADULT ONE DAILY GUMMIES PO) Take 2 tablets by mouth daily.    [provider]  Multiple Vitamins-Minerals (ZINC PO) Take 1 capsule by mouth daily.    [provider]  Omega-3 Fatty Acids (FISH OIL) 1200 MG CAPS Take 1,200 mg by mouth daily.    [provider]  promethazine (PHENERGAN) 12.5 MG tablet Take 12.5 mg by mouth See admin instructions. Take 12.5 mg by mouth every 6-8 hours as needed for nausea    [provider]  sennosides-docusate sodium  (SENOKOT-S) 8.6-50 MG tablet Take 1-2 tablets by mouth daily.    [provider]  VITAMIN A PO Take 1 capsule by mouth daily.    [provider]      Allergies    Amphetamine-dextroamphetamine, Lupron [leuprolide], Orilissa [elagolix], Penicillins, Chlorhexidine, Doxycycline, Mixed ragweed, Olanzapine, Other, Prozac [fluoxetine hcl], Stevia glycerite extract [flavoring agent], Tape, Triple antibiotic pain relief [neomy-bacit-polymyx-pramoxine], and Reglan [metoclopramide]    Review of Systems   Review of Systems  Constitutional:  Positive for fatigue. Negative for chills and fever.  HENT:  Positive for congestion, ear pain and sore throat.   Respiratory:  Positive for shortness of breath.   Cardiovascular:  Positive for chest pain.  Gastrointestinal:  Positive for abdominal pain and nausea. Negative for vomiting.  Genitourinary:  Negative for dysuria and frequency.  Musculoskeletal:  Positive for myalgias.  Neurological:  Negative for syncope.  All other systems reviewed and are negative.   Physical Exam Updated Vital Signs BP 118/74   Pulse 89   Temp 99.2 F (37.3 C) (Oral)   Resp (!) 23   Ht '5\' 3"'$  (1.6 m)   Wt 47.6 kg   LMP  (LMP Unknown)   SpO2 98%   BMI 18.60 kg/m  Physical Exam Vitals and nursing note reviewed.  Constitutional:      General: She is not in acute distress.    Appearance: She is well-developed.  HENT:     Head: Normocephalic and atraumatic.     Right Ear: Ear canal normal. Tympanic membrane is not perforated, erythematous, retracted or bulging.     Left Ear: Ear canal normal. Tympanic membrane is not perforated, erythematous, retracted or bulging.     Ears:     Comments: No mastoid erythema/swelling/tenderness.     Nose:     Right Sinus: No maxillary sinus tenderness or frontal sinus tenderness.     Left Sinus: No maxillary sinus tenderness or frontal sinus tenderness.     Mouth/Throat:     Pharynx: Uvula midline. No oropharyngeal  exudate or posterior oropharyngeal erythema.     Comments: Posterior oropharynx is symmetric appearing. Patient tolerating own secretions without difficulty. No trismus. No drooling. No hot potato voice. No swelling beneath the tongue, submandibular compartment is soft.  Eyes:     General:        Right eye: No discharge.        Left eye: No discharge.     Conjunctiva/sclera: Conjunctivae normal.     Pupils: Pupils are equal, round, and reactive to light.  Cardiovascular:     Rate and Rhythm: Normal rate and regular rhythm.     Heart sounds: No murmur heard. Pulmonary:     Effort: No respiratory distress.     Breath sounds: No wheezing, rhonchi or rales.     Comments: Patient initially on supplemental oxygen, I turned this off, she is maintaining SPO2 at 98% on room air without significant increased work of breathing.  Her initial tachypnea on arrival has resolved on my exam. Abdominal:     General: There is no distension.     Palpations: Abdomen is soft.     Tenderness: There is abdominal tenderness in the right upper quadrant, epigastric area and left upper quadrant.  Musculoskeletal:     Cervical back: Normal range of motion and neck supple. No edema or rigidity.     Right lower leg: No tenderness. No edema.     Left lower leg: No tenderness. No edema.  Lymphadenopathy:     Cervical: No cervical adenopathy.  Skin:    General: Skin is warm and dry.     Findings: No rash.  Neurological:     Mental Status: She is alert.  Psychiatric:        Behavior: Behavior normal.     ED Results / Procedures / Treatments   Labs (all labs ordered are listed, but only abnormal results are displayed) Labs Reviewed  COMPREHENSIVE METABOLIC PANEL - Abnormal; Notable for the following components:      Result Value   Potassium 2.8 (*)    Glucose, Bld 132 (*)    Total Protein 6.4 (*)    Albumin 2.9 (*)    Total Bilirubin 0.2 (*)    All other components within normal limits  CBC WITH  DIFFERENTIAL/PLATELET - Abnormal; Notable for the following components:   RBC 3.48 (*)    Hemoglobin 11.1 (*)    HCT 32.3 (*)    Monocytes Absolute 1.1 (*)    All other components within normal limits  I-STAT BETA HCG BLOOD, ED ([facility], [facility], [facility] ONLY) - Abnormal; Notable for the following components:   I-stat hCG, quantitative 5.9 (*)    All other components within normal limits  RESP PANEL BY RT-PCR (FLU A&B, COVID) ARPGX2  URINE CULTURE  LIPASE, BLOOD  D-DIMER, QUANTITATIVE  URINALYSIS, ROUTINE W REFLEX MICROSCOPIC  CBG MONITORING, ED  TROPONIN I (HIGH SENSITIVITY)  TROPONIN I (HIGH SENSITIVITY)    EKG EKG Interpretation  Date/Time:  Tuesday July 25 2022 22:24:22 EDT Ventricular Rate:  99 PR Interval:  156 QRS Duration: 84 QT Interval:  330 QTC Calculation: 423 R Axis:   41 Text Interpretation: Normal sinus rhythm Nonspecific ST abnormality Abnormal ECG Confirmed by Quintella Reichert [phone]) on 07/26/2022 12:08:39 AM  Radiology CT Angio Chest/Abd/Pel for Dissection W and/or W/WO  Result Date: 07/26/2022 CLINICAL DATA:  Chest or back pain, aortic dissection suspected. EXAM: CT ANGIOGRAPHY CHEST, ABDOMEN AND PELVIS TECHNIQUE: Non-contrast CT of the chest was initially obtained. Multidetector CT imaging through the chest, abdomen and pelvis was performed using the standard protocol during bolus administration of intravenous contrast. Multiplanar reconstructed images and MIPs were obtained and reviewed to evaluate the vascular anatomy. RADIATION DOSE REDUCTION: This exam was performed according to the departmental dose-optimization program which includes automated exposure control, adjustment of the mA and/or kV according to patient size and/or use of iterative reconstruction technique. CONTRAST:  65m OMNIPAQUE IOHEXOL 350 MG/ML SOLN COMPARISON:  03/23/2022. FINDINGS: CTA CHEST FINDINGS Cardiovascular: The heart is normal in size and there is no pericardial effusion. No aortic aneurysm  or dissection is seen. The pulmonary trunk is normal in caliber. Mediastinum/Nodes: No mediastinal, hilar, or axillary lymphadenopathy. The thyroid gland, trachea, and esophagus are within normal limits. Lungs/Pleura: Mild atelectasis is noted at the lung bases bilaterally. No consolidation, effusion, or pneumothorax. A few tree-in-bud nodular opacities are noted in the left upper lobe, axial image 43. Musculoskeletal: No acute  osseous abnormality. Review of the MIP images confirms the above findings. CTA ABDOMEN AND PELVIS FINDINGS VASCULAR Aorta: Normal caliber aorta without aneurysm, dissection, vasculitis or significant stenosis. Celiac: Patent without evidence of aneurysm, dissection, vasculitis or significant stenosis. SMA: Patent without evidence of aneurysm, dissection, vasculitis or significant stenosis. Renals: Both renal arteries are patent without evidence of aneurysm, dissection, vasculitis, fibromuscular dysplasia or significant stenosis. An accessory renal artery is noted on the right. IMA: Patent without evidence of aneurysm, dissection, vasculitis or significant stenosis. Inflow: Patent without evidence of aneurysm, dissection, vasculitis or significant stenosis. Veins: No obvious venous abnormality within the limitations of this arterial phase study. Review of the MIP images confirms the above findings. NON-VASCULAR Hepatobiliary: No focal liver abnormality is seen. No gallstones, gallbladder wall thickening, or biliary dilatation. Pancreas: Unremarkable. No pancreatic ductal dilatation or surrounding inflammatory changes. Spleen: Normal in size without focal abnormality. Adrenals/Urinary Tract: The adrenal glands are within normal limits. No renal calculus or hydronephrosis. The kidneys enhance symmetrically. The bladder is unremarkable. Stomach/Bowel: Stomach is within normal limits. Appendix appears normal. No evidence of bowel wall thickening, distention, or inflammatory changes. No free air or  pneumatosis. Lymphatic: No abdominal or pelvic lymphadenopathy. Reproductive: Status post hysterectomy. No adnexal masses. Other: No abdominopelvic ascites. Musculoskeletal: No acute osseous abnormality. Review of the MIP images confirms the above findings. IMPRESSION: 1. Aortic atherosclerosis with no evidence of aortic aneurysm or dissection. 2. Nonspecific tree-in-bud nodular opacities in the left upper lobe, which may be infectious or inflammatory. 3. No acute process in the abdomen and pelvis. Electronically Signed   By: Brett Fairy M.D.   On: 07/26/2022 04:59   DG Chest Portable 1 View  Result Date: 07/25/2022 CLINICAL DATA:  Dyspnea EXAM: PORTABLE CHEST 1 VIEW COMPARISON:  03/23/2022 FINDINGS: The heart size and mediastinal contours are within normal limits. Both lungs are clear. The visualized skeletal structures are unremarkable. IMPRESSION: No active disease. Electronically Signed   By: Donavan Foil M.D.   On: 07/25/2022 23:19    Procedures Procedures    Medications Ordered in ED Medications  ipratropium-albuterol (DUONEB) 0.5-2.5 (3) MG/3ML nebulizer solution 3 mL (has no administration in time range)  ipratropium-albuterol (DUONEB) 0.5-2.5 (3) MG/3ML nebulizer solution (has no administration in time range)    ED Course/ Medical Decision Making/ A&P                           Medical Decision Making Amount and/or Complexity of Data Reviewed Labs: ordered. Radiology: ordered.  Risk Prescription drug management.  Patient presents to the ED with complaints of shortness of breath as well as several other ailments, this involves an extensive number of treatment options, and is a complaint that carries with it a high risk of complications and morbidity. Nontoxic, vitals on arrival notable for tachycardia and significant tachypnea, however on my assessment patient's heart rate is in the 90s and her respiratory rate is normal, she does not appear in respiratory distress, I have taken  her off supplemental oxygen.  No significant wheezing however patient does report she typically does not wheeze with her asthma, will start DuoNeb, work-up initiated..   Additional history obtained:  Chart/nursing notes reviewed.  External records viewed including: Prior hospitalizations for pyelonephritis, urine culture most recently pan sensitive after growing out E. coli.  EKG: Normal sinus rhythm Nonspecific ST abnormality Abnormal ECG  Lab Tests:  I viewed & interpreted labs including:  CBC: Mild anemia similar to prior  CMP: Hypokalemia and hypoalbuminemia, potassium orally and parenterally replaced in the ED Lipase: Within normal limits Troponin: No significant elevation, delta flat D-dimer: Within normal limits UA unremarkable.   Imaging Studies:  I ordered and viewed the following imaging, agree with radiologist impression:  CXR: No active disease. CTA chest/abdomen/pelvis dissection study: 1. Aortic atherosclerosis with no evidence of aortic aneurysm or dissection. 2. Nonspecific tree-in-bud nodular opacities in the left upper lobe, which may be infectious or inflammatory. 3. No acute process in the abdomen and pelvis.  Work-up grossly reassuring in the emergency department.  Mild hypokalemia being orally and parenterally replaced.  No critical electrolyte derangement.  Troponins are flat.  D-dimer is negative.  CT angio without acute surgical process.  CT does show nonspecific findings in the left upper lobe, infectious versus inflammatory, given patient with subjective fevers and productive cough we will treat as possibly infectious, will place on azithromycin and cefpodoxime, patient does have a penicillin allergy of anaphylaxis however she has tolerated cephalosporins in the past per chart review.  She remains without respiratory distress on room air, does remain weak however she is ambulatory without hypoxia- spo2 97% with this.  She overall appears appropriate for discharge home.   We will treat as asthma exacerbation with steroids in addition to her antibiotic therapy.  She has run out of her nebulizer solution almost therefore this will also be sent to the pharmacy. I discussed results, treatment plan, need for follow-up, and return precautions with the patient and her father at bedside. Provided opportunity for questions, patient and her father confirmed understanding and are in agreement with plan.    This is a shared visit with supervising physician Dr. Ralene Bathe who has independently evaluated patient & provided guidance in evaluation/management/disposition, in agreement with care    Portions of this note were generated with Dragon dictation software. Dictation errors may occur despite best attempts at proofreading.  Final Clinical Impression(s) / ED Diagnoses Final diagnoses:  SOB (shortness of breath)    Rx / DC Orders ED Discharge Orders          Ordered    azithromycin (ZITHROMAX) 250 MG tablet  Daily        07/26/22 0649    cefpodoxime (VANTIN) 200 MG tablet  2 times daily        07/26/22 0649    predniSONE (DELTASONE) 50 MG tablet  Daily with breakfast        07/26/22 0649    potassium chloride SA (KLOR-CON M) 20 MEQ tablet  2 times daily        07/26/22 0649    albuterol (PROVENTIL) (2.5 MG/3ML) 0.083% nebulizer solution  Every 6 hours PRN        07/26/22 0649              Amaryllis Dyke, PA-C 07/26/22 2236    Quintella Reichert, MD 07/27/22 0010

## 2022-07-25 NOTE — BH Specialist Note (Addendum)
Integrated Behavioral Health via Telemedicine Visit  08/08/2022 Rachel [name] [id number]  Number of [place] Clinician visits: Additional Visit  Session Start time: 1157   Session End time: 2620  Total time in minutes: 94   Referring Provider: Lynnda Shields, MD Patient/Family location: Home [facility] Provider location: [facility] [place] [facility]  All persons participating in visit: [name] and Rachel [name]   Types of Service: Individual psychotherapy and Telephone visit  I connected with Rachel [name] [name]  via  Telephone or Video Enabled Telemedicine Application  (Video is Tree surgeon) and verified that I am speaking with the correct person using two identifiers. Discussed confidentiality: Yes   I discussed the limitations of telemedicine and the availability of in person appointments.  Discussed there is a possibility of technology failure and discussed alternative modes of communication if that failure occurs.  I discussed that engaging in this telemedicine visit, they consent to the provision of behavioral healthcare and the services will be billed under their insurance.  Patient and/or legal guardian expressed understanding and consented to Telemedicine visit: Yes   Presenting Concerns: Patient and/or family reports the following symptoms/concerns: Processing feelings regarding recent health struggles; worry about losing insurance and EBT. Duration of problem: Ongoing; Severity of problem: moderate  Patient and/or Family's Strengths/Protective Factors: Social connections, Social and Emotional competence, Concrete supports in place (healthy food, safe environments, etc.), Sense of purpose, and Physical Health (exercise, healthy diet, medication compliance, etc.)  Goals Addressed: Patient will:  Reduce symptoms of: anxiety and stress     Demonstrate ability to: Increase healthy adjustment to current life circumstances  Progress towards Goals: Ongoing  Interventions: Interventions utilized:  ACT (Acceptance and Commitment Therapy) Standardized Assessments completed: Not Needed  Patient and/or Family Response: Patient agrees with treatment [name].   Assessment: Patient currently experiencing ADHD, PTSD, Psychosocial stress.   Patient may benefit from continued therapeutic interventions.  [name]: Follow up with behavioral health clinician on : Two weeks Behavioral recommendations:  -Continue healthy self-coping strategies daily as needed -Continue [name] to contact Medicaid case worker for re-application Referral(s): [place] (In Clinic)  I discussed the assessment and treatment [name] with the patient and/or parent/guardian. They were provided an opportunity to ask questions and all were answered. They agreed with the [name] and demonstrated an understanding of the instructions.   They were advised to call back or seek an in-person evaluation if the symptoms worsen or if the condition fails to improve as anticipated.  Rachel Hamman Gwenlyn Hottinger, LCSW

## 2022-07-26 ENCOUNTER — Emergency Department (HOSPITAL_COMMUNITY): Payer: Medicaid Other

## 2022-07-26 ENCOUNTER — Ambulatory Visit (HOSPITAL_BASED_OUTPATIENT_CLINIC_OR_DEPARTMENT_OTHER): Payer: Self-pay | Admitting: Physical Therapy

## 2022-07-26 DIAGNOSIS — Z90710 Acquired absence of both cervix and uterus: Secondary | ICD-10-CM | POA: Diagnosis not present

## 2022-07-26 DIAGNOSIS — Q272 Other congenital malformations of renal artery: Secondary | ICD-10-CM | POA: Diagnosis not present

## 2022-07-26 DIAGNOSIS — I700 Atherosclerosis of aorta: Secondary | ICD-10-CM | POA: Diagnosis not present

## 2022-07-26 DIAGNOSIS — R918 Other nonspecific abnormal finding of lung field: Secondary | ICD-10-CM | POA: Diagnosis not present

## 2022-07-26 DIAGNOSIS — J9811 Atelectasis: Secondary | ICD-10-CM | POA: Diagnosis not present

## 2022-07-26 LAB — CBC WITH DIFFERENTIAL/PLATELET
Abs Immature Granulocytes: 0.07 10*3/uL (ref 0.00–0.07)
Basophils Absolute: 0.1 10*3/uL (ref 0.0–0.1)
Basophils Relative: 1 %
Eosinophils Absolute: 0.2 10*3/uL (ref 0.0–0.5)
Eosinophils Relative: 2 %
HCT: 32.3 % — ABNORMAL LOW (ref 36.0–46.0)
Hemoglobin: 11.1 g/dL — ABNORMAL LOW (ref 12.0–15.0)
Immature Granulocytes: 1 %
Lymphocytes Relative: 26 %
Lymphs Abs: 2.6 10*3/uL (ref 0.7–4.0)
MCH: 31.9 pg (ref 26.0–34.0)
MCHC: 34.4 g/dL (ref 30.0–36.0)
MCV: 92.8 fL (ref 80.0–100.0)
Monocytes Absolute: 1.1 10*3/uL — ABNORMAL HIGH (ref 0.1–1.0)
Monocytes Relative: 10 %
Neutro Abs: 6.3 10*3/uL (ref 1.7–7.7)
Neutrophils Relative %: 60 %
Platelets: 306 10*3/uL (ref 150–400)
RBC: 3.48 MIL/uL — ABNORMAL LOW (ref 3.87–5.11)
RDW: 12 % (ref 11.5–15.5)
WBC: 10.3 10*3/uL (ref 4.0–10.5)
nRBC: 0 % (ref 0.0–0.2)

## 2022-07-26 LAB — COMPREHENSIVE METABOLIC PANEL
ALT: 11 U/L (ref 0–44)
AST: 15 U/L (ref 15–41)
Albumin: 2.9 g/dL — ABNORMAL LOW (ref 3.5–5.0)
Alkaline Phosphatase: 60 U/L (ref 38–126)
Anion gap: 9 (ref 5–15)
BUN: 6 mg/dL (ref 6–20)
CO2: 27 mmol/L (ref 22–32)
Calcium: 9.2 mg/dL (ref 8.9–10.3)
Chloride: 106 mmol/L (ref 98–111)
Creatinine, Ser: 0.65 mg/dL (ref 0.44–1.00)
GFR, Estimated: >60 mL/min (ref >60)
Glucose, Bld: 132 mg/dL — ABNORMAL HIGH (ref 70–99)
Potassium: 2.8 mmol/L — ABNORMAL LOW (ref 3.5–5.1)
Sodium: 142 mmol/L (ref 135–145)
Total Bilirubin: 0.2 mg/dL — ABNORMAL LOW (ref 0.3–1.2)
Total Protein: 6.4 g/dL — ABNORMAL LOW (ref 6.5–8.1)

## 2022-07-26 LAB — URINALYSIS, ROUTINE W REFLEX MICROSCOPIC
Bilirubin Urine: NEGATIVE
Glucose, UA: NEGATIVE mg/dL
Hgb urine dipstick: NEGATIVE
Ketones, ur: NEGATIVE mg/dL
Leukocytes,Ua: NEGATIVE
Nitrite: NEGATIVE
Protein, ur: NEGATIVE mg/dL
Specific Gravity, Urine: >1.046 — ABNORMAL HIGH (ref 1.005–1.030)
pH: 5 (ref 5.0–8.0)

## 2022-07-26 LAB — CBG MONITORING, ED: Glucose-Capillary: 89 mg/dL (ref 70–99)

## 2022-07-26 LAB — TROPONIN I (HIGH SENSITIVITY)
Troponin I (High Sensitivity): 3 ng/L (ref <18)
Troponin I (High Sensitivity): 5 ng/L (ref <18)

## 2022-07-26 LAB — RESP PANEL BY RT-PCR (FLU A&B, COVID) ARPGX2
Influenza A by PCR: NEGATIVE
Influenza B by PCR: NEGATIVE
SARS Coronavirus 2 by RT PCR: NEGATIVE

## 2022-07-26 LAB — D-DIMER, QUANTITATIVE: D-Dimer, Quant: <0.27 ug/mL-FEU (ref 0.00–0.50)

## 2022-07-26 LAB — LIPASE, BLOOD: Lipase: 33 U/L (ref 11–51)

## 2022-07-26 LAB — I-STAT BETA HCG BLOOD, ED (MC, WL, AP ONLY): I-stat hCG, quantitative: 5.9 m[IU]/mL — ABNORMAL HIGH (ref <5)

## 2022-07-26 MED ORDER — CEFPODOXIME PROXETIL 200 MG PO TABS: 200.0000 mg | ORAL_TABLET | Freq: Two times a day (BID) | ORAL | 0 refills | Status: DC

## 2022-07-26 MED ORDER — ONDANSETRON HCL 4 MG/2ML IJ SOLN
4.0000 mg | Freq: Once | INTRAMUSCULAR | Status: AC
  Administered 2022-07-26: 4 mg via INTRAVENOUS
  Filled 2022-07-26: qty 2

## 2022-07-26 MED ORDER — AZITHROMYCIN 250 MG PO TABS: 250.0000 mg | ORAL_TABLET | Freq: Every day | ORAL | 0 refills | Status: DC

## 2022-07-26 MED ORDER — SODIUM CHLORIDE 0.9 % IV BOLUS
1000.0000 mL | Freq: Once | INTRAVENOUS | Status: AC
  Administered 2022-07-26: 1000 mL via INTRAVENOUS

## 2022-07-26 MED ORDER — POTASSIUM CHLORIDE CRYS ER 20 MEQ PO TBCR
40.0000 meq | EXTENDED_RELEASE_TABLET | Freq: Once | ORAL | Status: AC
  Administered 2022-07-26: 40 meq via ORAL
  Filled 2022-07-26: qty 2

## 2022-07-26 MED ORDER — IOHEXOL 350 MG/ML SOLN
80.0000 mL | Freq: Once | INTRAVENOUS | Status: AC | PRN
  Administered 2022-07-26: 80 mL via INTRAVENOUS

## 2022-07-26 MED ORDER — POTASSIUM CHLORIDE CRYS ER 20 MEQ PO TBCR: 20.0000 meq | EXTENDED_RELEASE_TABLET | Freq: Two times a day (BID) | ORAL | 0 refills | Status: DC

## 2022-07-26 MED ORDER — ALBUTEROL SULFATE (2.5 MG/3ML) 0.083% IN NEBU: 2.5000 mg | INHALATION_SOLUTION | Freq: Four times a day (QID) | RESPIRATORY_TRACT | 0 refills | Status: DC | PRN

## 2022-07-26 MED ORDER — IPRATROPIUM-ALBUTEROL 0.5-2.5 (3) MG/3ML IN SOLN
3.0000 mL | Freq: Once | RESPIRATORY_TRACT | Status: AC
Start: 2022-07-26 — End: 2022-07-26
  Administered 2022-07-26: 3 mL via RESPIRATORY_TRACT
  Filled 2022-07-26: qty 3

## 2022-07-26 MED ORDER — PREDNISONE 50 MG PO TABS: 50.0000 mg | ORAL_TABLET | Freq: Every day | ORAL | 0 refills | Status: DC

## 2022-07-26 MED ORDER — METHYLPREDNISOLONE SODIUM SUCC 125 MG IJ SOLR
125.0000 mg | Freq: Once | INTRAMUSCULAR | Status: AC
  Administered 2022-07-26: 125 mg via INTRAVENOUS
  Filled 2022-07-26: qty 2

## 2022-07-26 MED ORDER — HYDROMORPHONE HCL 1 MG/ML IJ SOLN
0.5000 mg | Freq: Once | INTRAMUSCULAR | Status: AC
  Administered 2022-07-26: 0.5 mg via INTRAVENOUS
  Filled 2022-07-26: qty 1

## 2022-07-26 MED ORDER — POTASSIUM CHLORIDE 10 MEQ/100ML IV SOLN
10.0000 meq | Freq: Once | INTRAVENOUS | Status: AC
Start: 2022-07-26 — End: 2022-07-26
  Administered 2022-07-26: 10 meq via INTRAVENOUS
  Filled 2022-07-26: qty 100

## 2022-07-26 NOTE — ED Notes (Signed)
RN reviewed discharge instructions with pt. Pt verbalized understanding and had no further questions. VSS upon discharge.  

## 2022-07-26 NOTE — ED Notes (Signed)
ED Provider at bedside. 

## 2022-07-26 NOTE — Discharge Instructions (Addendum)
You were seen in the ER today for shortness of breath as well as fatigue, achiness, fevers, and abdominal pain.  Your work up was overall reassuring.  Your chest x-ray was normal.  Your CT scan showed some inflammatory versus infectious changes to your left lung, we are covering you for an infectious process with antibiotics, please take cefpodoxime and azithromycin as prescribed.  We are also sending you home with prednisone for possible asthma exacerbation.  We have prescribed you new medication(s) today. Discuss the medications prescribed today with your pharmacist as they can have adverse effects and interactions with your other medicines including over the counter and prescribed medications. Seek medical evaluation if you start to experience new or abnormal symptoms after taking one of these medicines, seek care immediately if you start to experience difficulty breathing, feeling of your throat closing, facial swelling, or rash as these could be indications of a more serious allergic reaction  Your CT scan also showed some plaque in your aorta, please discuss with your primary care provider to ensure that your blood pressure and cholesterol are well managed.  Potassium levels low, please include potassium rich foods in your diet and take the potassium supplements prescribed for the next 3 days.  Your protein level was also mildly low.  Please have this rechecked by your primary care provider.   The remainder of your work-up was reassuring your heart enzymes were negative and your D-dimer blood clot test was negative.  We would like you to stay well-hydrated with electrolyte containing fluids.  Follow-up with your primary care provider within 3 days.  Return to the emergency department for new or worsening symptoms or any other concerns.

## 2022-07-26 NOTE — ED Notes (Signed)
Pt ambulated to bathroom with assistance from this RN. SPO2 remained 95-97% while ambulating. Pt reported dizziness upon standing initially

## 2022-07-26 NOTE — ED Notes (Signed)
Pt diaphoretic, rectal temp 97.7, breathing slightly labored, pt reports heaviness in chest. Petrucelli PA made aware, CBG ordered

## 2022-07-26 NOTE — ED Notes (Signed)
Pt bladder scanned, 60m shown

## 2022-07-27 ENCOUNTER — Telehealth: Payer: Self-pay

## 2022-07-27 LAB — URINE CULTURE: Culture: NO GROWTH

## 2022-07-27 NOTE — Telephone Encounter (Signed)
Transition Care Management Follow-up Telephone Call Date of discharge and from where: 07/26/2022 from [facility] How have you been since you were released from the hospital? Patient is feeling slightly better. Patient has picked up her prescriptions and started taking them yesterday.  Any questions or concerns? No  Items Reviewed: Did the pt receive and understand the discharge instructions provided? Yes  Medications obtained and verified? Yes  Other? No  Any new allergies since your discharge? No  Dietary orders reviewed? Yes Do you have support at home? Yes   Functional Questionnaire: (I = Independent and D = Dependent) ADLs: I  Bathing/Dressing- I  Meal Prep- I  Eating- I  Maintaining continence- I  Transferring/Ambulation- I  Managing Meds- I   Follow up appointments reviewed:  PCP Hospital f/u appt confirmed? Yes  Scheduled to see 08/02/2022 on Dr. Margarita Rana @ 2:10pm. [facility] f/u appt confirmed? No   Are transportation arrangements needed? No  If their condition worsens, is the pt aware to call PCP or go to the Emergency Dept.? Yes Was the patient provided with contact information for the PCP's office or ED? Yes Was to pt encouraged to call back with questions or concerns? Yes

## 2022-08-02 ENCOUNTER — Ambulatory Visit: Payer: Medicaid Other | Attending: Family Medicine | Admitting: Family Medicine

## 2022-08-02 ENCOUNTER — Encounter: Payer: Self-pay | Admitting: Family Medicine

## 2022-08-02 VITALS — BP 128/85 | HR 75 | Temp 99.0°F | Ht 63.0 in | Wt 114.4 lb

## 2022-08-02 DIAGNOSIS — Z23 Encounter for immunization: Secondary | ICD-10-CM

## 2022-08-02 DIAGNOSIS — I700 Atherosclerosis of aorta: Secondary | ICD-10-CM

## 2022-08-02 DIAGNOSIS — E43 Unspecified severe protein-calorie malnutrition: Secondary | ICD-10-CM

## 2022-08-02 DIAGNOSIS — D509 Iron deficiency anemia, unspecified: Secondary | ICD-10-CM | POA: Diagnosis not present

## 2022-08-02 MED ORDER — ATORVASTATIN CALCIUM 20 MG PO TABS: 20.0000 mg | ORAL_TABLET | Freq: Every day | ORAL | 3 refills | Status: DC

## 2022-08-02 MED ORDER — IRON (FERROUS SULFATE) 325 (65 FE) MG PO TABS: 325.0000 mg | ORAL_TABLET | Freq: Every day | ORAL | 2 refills | Status: DC

## 2022-08-02 NOTE — Progress Notes (Signed)
Subjective:  Patient ID: Rachel [name], female    DOB: 1986/10/08  Age: 36 y.o. MRN: [id number]  CC: Pain (3 mo f/u. Pt seen in ER for pulmonary infection & asthma. Aortic artery buildup per ER. Discuss weight. )   HPI Rachel [name] is a 36 y.o. year old female with a history of bipolar disorder, anxiety, depression, GERD, IBS with constipation, status post laparoscopic TAHBSO due to abnormal uterine bleed currently on Premarin by GYN)  Interval History: She had an ED visit 1 week ago for dyspnea.  Labs revealed hypokalemia and hypoalbuminemia which were replaced at ED visit.  Chest x-ray revealed no acute cardiopulmonary process.  CTA chest suspicious for inflammatory versus infectious process and she was placed on antibiotics. CTA chest revealed: IMPRESSION: 1. Aortic atherosclerosis with no evidence of aortic aneurysm or dissection. 2. Nonspecific tree-in-bud nodular opacities in the left upper lobe, which may be infectious or inflammatory. 3. No acute process in the abdomen and pelvis.  Past Medical History:  Diagnosis Date  . ADHD (attention deficit hyperactivity disorder)   . Anemia   . Anxiety   . Asthma   . Bipolar disorder ([place])   . Complication of anesthesia    slow to wake up   . Delayed gastric emptying 02/23/2020  . Depression   . DUB (dysfunctional uterine bleeding) 02/23/2020  . Ehlers-Danlos syndrome   . Endometriosis   . Gastroparesis   . GERD (gastroesophageal reflux disease)   . Heart murmur   . Hiatal hernia 04/06/2020  . Myalgia 04/06/2020  . Parasitic infection 02/23/2020  . Pneumonia   . Poor appetite 02/23/2020  . RUQ pain 02/23/2020  . Vitamin D deficiency   . Weight loss 02/23/2020    Past Surgical History:  Procedure Laterality Date  . barium study    . COLONOSCOPY    . CYSTOSCOPY N/A 07/12/2021   Procedure: CYSTOSCOPY;  Surgeon: [name], MD;  Location: [facility];  Service: Gynecology;  Laterality: N/A;  .  ESOPHAGOGASTRODUODENOSCOPY  2011   and a ph study as well.   [name] Kitchen LAPAROSCOPY N/A 01/26/2021   Procedure: LAPAROSCOPY DIAGNOSTIC;  Surgeon: [name], MD;  Location: [facility];  Service: Gynecology;  Laterality: N/A;  . NASAL SEPTUM SURGERY    . TONGUE FLAP    . TOTAL LAPAROSCOPIC HYSTERECTOMY WITH SALPINGECTOMY Bilateral 07/12/2021   Procedure: TOTAL LAPAROSCOPIC HYSTERECTOMY WITH SALPINGECTOMY, OOPHORECTOMY;  Surgeon: [name], MD;  Location: [facility];  Service: Gynecology;  Laterality: Bilateral;  . UPPER GASTROINTESTINAL ENDOSCOPY    . WISDOM TOOTH EXTRACTION      Family History  Problem Relation Age of Onset  . Colon polyps Mother   . Depression Mother   . Anxiety disorder Mother   . Hypertension Mother   . Hyperlipidemia Mother   . Asthma Mother   . Endometriosis Mother   . Fibroids Mother   . Fibromyalgia Mother   . Migraines Mother   . Irritable bowel syndrome Mother   . Depression Father   . Hypertension Father   . Hyperlipidemia Father   . Bipolar disorder Father   . Bipolar disorder Sister   . ADD / ADHD Sister   . Asthma Sister   . Endometriosis Sister   . Seizures Sister   . Bipolar disorder Sister   . Anxiety disorder Sister   . Heart disease Maternal Grandmother   . Endometriosis Maternal Grandmother   . Fibroids Maternal Grandmother   . Hyperlipidemia Maternal Grandmother   .  Hypertension Maternal Grandmother   . Diabetes Maternal Grandmother   . Fibromyalgia Maternal Grandmother   . Heart disease Maternal Grandfather   . Diabetes Maternal Grandfather   . Heart disease Paternal Grandmother   . Heart disease Paternal Grandfather   . Colon cancer Maternal Great-grandmother   . Pancreatic cancer Neg Hx   . Esophageal cancer Neg Hx   . Stomach cancer Neg Hx     Social History   Socioeconomic History  . Marital status: Divorced    Spouse name: Not on file  . Number of children: Not on file  . Years of education: Not on  file  . Highest education level: Not on file  Occupational History  . Not on file  Tobacco Use  . Smoking status: Never  . Smokeless tobacco: Never  Vaping Use  . Vaping Use: Never used  Substance and Sexual Activity  . Alcohol use: Yes    Alcohol/week: 1.0 standard drink of alcohol    Types: 1 Glasses of wine per week    Comment: 1 glass of wine per week  . Drug use: No  . Sexual activity: Not Currently  Other Topics Concern  . Not on file  Social History Narrative  . Not on file   Social Determinants of Health   Financial Resource Strain: Not on file  Food Insecurity: No Food Insecurity (02/21/2021)   Hunger Vital Sign   . Worried About Charity fundraiser in the Last Year: Never true   . Ran Out of Food in the Last Year: Never true  Transportation Needs: No Transportation Needs (02/21/2021)   PRAPARE - Transportation   . Lack of Transportation (Medical): No   . Lack of Transportation (Non-Medical): No  Physical Activity: Not on file  Stress: Not on file  Social Connections: Not on file    Allergies  Allergen Reactions  . Amphetamine-Dextroamphetamine Other (See Comments)    Triggered bipolar, caused depression, and suicidal thoughts  . Lupron [Leuprolide] Anaphylaxis  . Freida Busman [Elagolix] Anaphylaxis  . Penicillins Anaphylaxis, Swelling and Other (See Comments)    Potential for anaphylaxis confirmed. Tolerates Cefphalosporins No "-cillins"!!!! Did it involve swelling of the face/tongue/throat, SOB, or low BP? Yes Did it involve sudden or severe rash/hives, skin peeling, or any reaction on the inside of your mouth or nose? Unknown Did you need to seek medical attention at a hospital or doctor's office? Unknown When did it last happen? teenager      If all above answers are "NO", may proceed with cephalosporin use.  . Chlorhexidine Hives    DuraPrep   . Doxycycline Itching, Nausea And Vomiting and Other (See Comments)    Caused high fever, also  . Mixed Ragweed  Other (See Comments)    Unknown, Mother and pt state she can't have   . Olanzapine Other (See Comments)    Caused mania  . Other Other (See Comments)    Stimulants, mood stabilizers, and antidepressants = Triggered bipolar, caused depression, and suicidal thoughts  . Prozac [Fluoxetine Hcl] Other (See Comments)    Triggered bipolar, caused depression, and suicidal thoughts  . Stevia Glycerite Extract [Flavoring Agent] Other (See Comments)    Unknown reaction; mother and pt confirm any Artificial Sweeteners cause reaction  . Tape Other (See Comments)    Paper tape is tolerated  . Triple Antibiotic Pain Relief [Neomy-Bacit-Polymyx-Pramoxine] Hives  . Reglan [Metoclopramide] Anxiety and Other (See Comments)    Triggered bipolar, caused depression, suicidal thoughts, muscle twitching,  stiffness, and high anxiety     Outpatient Medications Prior to Visit  Medication Sig Dispense Refill  . albuterol (PROVENTIL) (2.5 MG/3ML) 0.083% nebulizer solution Take 3 mLs (2.5 mg total) by nebulization every 6 (six) hours as needed for wheezing or shortness of breath. Max 4 doses per day 75 mL 0  . albuterol (VENTOLIN HFA) 108 (90 Base) MCG/ACT inhaler Inhale 1-2 puffs into the lungs every 6 (six) hours as needed for wheezing or shortness of breath. 8 g 1  . Ascorbic Acid (VITAMIN C) 1000 MG tablet Take 1,000 mg by mouth 2 (two) times daily.    . B Complex Vitamins (B COMPLEX PO) Take 1 capsule by mouth daily.    . Calcium-Magnesium-Vitamin D 600-40-500 MG-MG-UNIT TB24 Take 3 tablets by mouth in the morning and at bedtime.    . Cholecalciferol (VITAMIN D3) 50 MCG (2000 UT) TABS Take 2,000 Units by mouth daily.    [name] Kitchen ELDERBERRY PO Take 1 tablet by mouth daily.    [name] Kitchen EPINEPHrine 0.3 mg/0.3 mL IJ SOAJ injection Inject 0.3 mg into the muscle as needed for anaphylaxis. 2 each 0  . estrogens, conjugated, (PREMARIN) 0.45 MG tablet Take 1 tablet (0.45 mg total) by mouth daily. 30 tablet 6  . estrogens, conjugated,  (PREMARIN) 0.625 MG tablet Take 1 tablet (0.625 mg total) by mouth daily. Take daily for 21 days then do not take for 7 days. 30 tablet 11  . Ferrous Sulfate (IRON PO) Take 1 capsule by mouth daily.    . fexofenadine (ALLEGRA) 180 MG tablet Take 180 mg by mouth daily.    [name] Kitchen ibuprofen (ADVIL) 600 MG tablet Take 1 tablet (600 mg total) by mouth every 6 (six) hours as needed for headache, mild pain, moderate pain or cramping. 30 tablet 2  . LINZESS 290 MCG CAPS capsule TAKE ONE CAPSULE BY MOUTH EVERY MORNING BEFORE BREAKFAST 30 capsule 3  . Melatonin 10 MG TABS Take 10 mg by mouth at bedtime.    [name] Kitchen MIRALAX 17 GM/SCOOP powder Take 17 g by mouth See admin instructions. Mix 17 g of powder into water and drink once a day    . Multiple Vitamins-Minerals (ADULT ONE DAILY GUMMIES PO) Take 2 tablets by mouth daily.    . Multiple Vitamins-Minerals (ZINC PO) Take 1 capsule by mouth daily.    . Omega-3 Fatty Acids (FISH OIL) 1200 MG CAPS Take 1,200 mg by mouth daily.    . promethazine (PHENERGAN) 12.5 MG tablet Take 12.5 mg by mouth See admin instructions. Take 12.5 mg by mouth every 6-8 hours as needed for nausea    . sennosides-docusate sodium (SENOKOT-S) 8.6-50 MG tablet Take 1-2 tablets by mouth daily.    [name] Kitchen VITAMIN A PO Take 1 capsule by mouth daily.    [name] Kitchen azithromycin (ZITHROMAX) 250 MG tablet Take 1 tablet (250 mg total) by mouth daily. Take first 2 tablets together, then 1 every day until finished. (Patient not taking: Reported on 08/02/2022) 6 tablet 0  . cefpodoxime (VANTIN) 200 MG tablet Take 1 tablet (200 mg total) by mouth 2 (two) times daily. (Patient not taking: Reported on 08/02/2022) 14 tablet 0  . potassium chloride SA (KLOR-CON M) 20 MEQ tablet Take 1 tablet (20 mEq total) by mouth 2 (two) times daily. (Patient not taking: Reported on 08/02/2022) 3 tablet 0  . predniSONE (DELTASONE) 50 MG tablet Take 1 tablet (50 mg total) by mouth daily with breakfast. (Patient not taking: Reported on 08/02/2022) 5  tablet 0  No facility-administered medications prior to visit.     ROS Review of Systems *** Objective:  BP 128/85 (BP Location: Left Arm, Patient Position: Sitting, Cuff Size: Normal)   Pulse 75   Temp 99 F (37.2 C) (Oral)   Ht '5\' 3"'$  (1.6 m)   Wt 114 lb 6.4 oz (51.9 kg)   LMP  (LMP Unknown)   SpO2 98%   BMI 20.27 kg/m      08/02/2022    1:59 PM 07/26/2022    7:00 AM 07/26/2022    6:00 AM  BP/Weight  Systolic BP 782 956 213  Diastolic BP 85 78 76  Wt. (Lbs) 114.4    BMI 20.27 kg/m2      Wt Readings from Last 3 Encounters:  08/02/22 114 lb 6.4 oz (51.9 kg)  07/25/22 105 lb (47.6 kg)  05/02/22 107 lb (48.5 kg)     Physical Exam ***    Latest Ref Rng & Units 07/25/2022   11:38 PM 03/27/2022    7:20 AM 03/26/2022    2:59 AM  CMP  Glucose 70 - 99 mg/dL 132  91  102   BUN 6 - 20 mg/dL 6  5  <5   Creatinine 0.44 - 1.00 mg/dL 0.65  0.64  0.49   Sodium 135 - 145 mmol/L 142  137  140   Potassium 3.5 - 5.1 mmol/L 2.8  3.6  3.9   Chloride 98 - 111 mmol/L 106  100  110   CO2 22 - 32 mmol/L 27  32  24   Calcium 8.9 - 10.3 mg/dL 9.2  9.3  8.5   Total Protein 6.5 - 8.1 g/dL 6.4     Total Bilirubin 0.3 - 1.2 mg/dL 0.2     Alkaline Phos 38 - 126 U/L 60     AST 15 - 41 U/L 15     ALT 0 - 44 U/L 11       Lipid Panel  No results found for: "CHOL", "TRIG", "HDL", "CHOLHDL", "VLDL", "LDLCALC", "LDLDIRECT"  CBC    Component Value Date/Time   WBC 10.3 07/25/2022 2338   RBC 3.48 (L) 07/25/2022 2338   HGB 11.1 (L) 07/25/2022 2338   HGB 13.0 06/28/2020 1600   HCT 32.3 (L) 07/25/2022 2338   HCT 40.1 06/28/2020 1600   PLT 306 07/25/2022 2338   PLT 310 06/28/2020 1600   MCV 92.8 07/25/2022 2338   MCV 96 06/28/2020 1600   MCH 31.9 07/25/2022 2338   MCHC 34.4 07/25/2022 2338   RDW 12.0 07/25/2022 2338   RDW 12.9 06/28/2020 1600   LYMPHSABS 2.6 07/25/2022 2338   LYMPHSABS 2.1 06/28/2020 1600   MONOABS 1.1 (H) 07/25/2022 2338   EOSABS 0.2 07/25/2022 2338   EOSABS 0.2  06/28/2020 1600   BASOSABS 0.1 07/25/2022 2338   BASOSABS 0.1 06/28/2020 1600    No results found for: "HGBA1C"  Assessment & [name]:  There are no diagnoses linked to this encounter.  Health Care Maintenance: *** No orders of the defined types were placed in this encounter.   Follow-up: No follow-ups on file.       Charlott Rakes, MD, FAAFP. [facility] and [facility], [place]   08/02/2022, 2:19 PM

## 2022-08-02 NOTE — Patient Instructions (Addendum)
Urology: Sent Referral to [facility] ph. # [phone].Address [place] [address] floor.  They will contact the patient to schedule an appointment

## 2022-08-03 ENCOUNTER — Ambulatory Visit (HOSPITAL_BASED_OUTPATIENT_CLINIC_OR_DEPARTMENT_OTHER): Payer: Medicaid Other | Attending: Family Medicine | Admitting: Physical Therapy

## 2022-08-03 ENCOUNTER — Encounter (HOSPITAL_BASED_OUTPATIENT_CLINIC_OR_DEPARTMENT_OTHER): Payer: Self-pay | Admitting: Physical Therapy

## 2022-08-03 ENCOUNTER — Encounter: Payer: Self-pay | Admitting: Family Medicine

## 2022-08-03 DIAGNOSIS — R279 Unspecified lack of coordination: Secondary | ICD-10-CM | POA: Diagnosis not present

## 2022-08-03 DIAGNOSIS — M6281 Muscle weakness (generalized): Secondary | ICD-10-CM | POA: Diagnosis not present

## 2022-08-03 DIAGNOSIS — M791 Myalgia, unspecified site: Secondary | ICD-10-CM | POA: Diagnosis not present

## 2022-08-03 NOTE — Therapy (Signed)
OUTPATIENT PHYSICAL THERAPY TREATMENT NOTE      Patient Name: Rachel [name] MRN: [id number] DOB:[birth year], 36 y.o., female Today's Date: 08/03/2022   END OF SESSION:   PT End of Session - 08/03/22 1115     Visit Number 13    Number of Visits 12    Date for PT Re-Evaluation 08/07/22    Authorization Type medicaid healthy blue    Authorization Time Period 6/28 - 8/26 6 visits [employer]    Progress Note Due on Visit 17    PT Start Time 1055    PT Stop Time 1140    PT Time Calculation (min) 45 min    Activity Tolerance Patient limited by fatigue;Patient tolerated treatment well    Behavior During Therapy [facility] for tasks assessed/performed                  Past Medical History:  Diagnosis Date   ADHD (attention deficit hyperactivity disorder)    Anemia    Anxiety    Asthma    Bipolar disorder ([place])    Complication of anesthesia    slow to wake up    Delayed gastric emptying 02/23/2020   Depression    DUB (dysfunctional uterine bleeding) 02/23/2020   Ehlers-Danlos syndrome    Endometriosis    Gastroparesis    GERD (gastroesophageal reflux disease)    Heart murmur    Hiatal hernia 04/06/2020   Myalgia 04/06/2020   Parasitic infection 02/23/2020   Pneumonia    Poor appetite 02/23/2020   RUQ pain 02/23/2020   Vitamin D deficiency    Weight loss 02/23/2020   Past Surgical History:  Procedure Laterality Date   barium study     COLONOSCOPY     CYSTOSCOPY N/A 07/12/2021   Procedure: CYSTOSCOPY;  Surgeon: [name], MD;  Location: [place];  Service: Gynecology;  Laterality: N/A;   ESOPHAGOGASTRODUODENOSCOPY  2011   and a ph study as well.    LAPAROSCOPY N/A 01/26/2021   Procedure: LAPAROSCOPY DIAGNOSTIC;  Surgeon: [name], MD;  Location: [facility];  Service: Gynecology;  Laterality: N/A;   NASAL SEPTUM SURGERY     TONGUE FLAP     TOTAL LAPAROSCOPIC HYSTERECTOMY WITH SALPINGECTOMY Bilateral 07/12/2021   Procedure: TOTAL  LAPAROSCOPIC HYSTERECTOMY WITH SALPINGECTOMY, OOPHORECTOMY;  Surgeon: [name], MD;  Location: [place];  Service: Gynecology;  Laterality: Bilateral;   UPPER GASTROINTESTINAL ENDOSCOPY     WISDOM TOOTH EXTRACTION     Patient Active Problem List   Diagnosis Date Noted   Iron deficiency anemia 01/20/2022   Acute hyponatremia 01/18/2022   Pyelonephritis 01/18/2022   Protein-calorie malnutrition, severe 01/18/2022   sepsis secondary to pyelonephritis of left kidney 01/17/2022   Sepsis ([place]) 01/17/2022   Chronic abdominal pain 01/17/2022   Dyspnea 01/17/2022   Underweight 01/17/2022   Irritable bowel syndrome (IBS) 01/17/2022   Hypokalemia 01/17/2022   Vasomotor symptoms due to menopause 08/17/2021   S/P laparoscopic hysterectomy 07/12/2021   Preoperative exam for gynecologic surgery 06/20/2021   Adverse drug reaction 06/01/2021   Draining postoperative wound 02/28/2021   Encounter for postoperative care 02/16/2021   Endometriosis determined by laparoscopy    Pelvic pain in female 10/11/2020   Presence of subdermal contraceptive implant 10/11/2020   Dysmenorrhea 10/11/2020   Dyspareunia in female 10/11/2020   NSVT (nonsustained ventricular tachycardia) ([place]) 09/03/2020   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   Palpitations 07/26/2020   Family history of connective tissue disease 07/26/2020   Myalgia 04/06/2020  Hiatal hernia 04/06/2020   Parasitic infection 02/23/2020   Weight loss 02/23/2020   Poor appetite 02/23/2020   RUQ pain 02/23/2020   Delayed gastric emptying 02/23/2020   DUB (dysfunctional uterine bleeding) 02/23/2020   Cervical radiculopathy 05/31/2018   Hyperalgesia 05/31/2018   Acid reflux 12/26/2017   ADHD (attention deficit hyperactivity disorder) 05/07/2012   Bipolar 1 disorder ([place]) 05/07/2012    RPCP: Charlott Rakes, MD   REFERRING PROVIDER: Charlott Rakes, MD   REFERRING DIAG: M79.10 (ICD-10-CM) - Myalgia   THERAPY DIAG:  Myalgia   Muscle  weakness (generalized)   Difficulty in walking, not elsewhere classified   ONSET DATE: >21yr   SUBJECTIVE:                                                                                                                                                                                            SUBJECTIVE STATEMENT: "Was in hospital with a lung infection better now just weak" "today my hips and knees are hurting"  PERTINENT HISTORY:  Irritable bowel syndrome with both constipation and diarrhea          Moderate protein-calorie malnutrition bipolar disorder, anxiety, depression, GERD, IBS with constipation, status post laparoscopic TAHBSO due to abnormal uterine bleed    currently on Premarin by GYN) She was hospitalized for pyelonephritis from 01/17/2022 through 01/21/2022. PAIN:  Are you having pain? Yes: NPRS scale: 7/10 general Pain location: cerv spine, left shoulder and arm, LB, hips Pain description: ache Aggravating factors: overdoing although tolerance levels vary Relieving factors: rest     PRECAUTIONS: Other: being [facility]/hypermobile joints   WEIGHT BEARING RESTRICTIONS No   FALLS:  Has patient fallen in last 6 months? No   LIVING ENVIRONMENT: Lives with: lives with their family Lives in: House/apartment Stairs: Yes: Internal: 1 flight steps; yes     OCCUPATION: unable to work   PLOF: Independent with basic ADLs   PATIENT GOALS gain strength, decrease pain     OBJECTIVE:    COGNITION:           Overall cognitive status:  WFL although pt reports bouts of brain fog                                  SENSATION: Numbness and tingling LUE into cervical spine intermittent Occasional numbness and tingling in LE   MUSCLE LENGTH: Connective tissue disorder probable/hypermobile   POSTURE:  Very slight figure, slightly rounded shoulders       ROM throughout wfl Strength UE 3+/5 LE 3/5  05/24/22  UE 4-/5              LE 4-/5   06/22/22 UE  4-/5     LE 4-/5   Limited to pain     FUNCTIONAL TESTS:  5 times sit to stand: 38 Timed up and go (TUG): 18 Berg Balance Scale: 21/56  05/24/22 5 x STS: 33s TUG: 15 Berg: 37/56  06/21/22 5  x STS: 39  TUG: 19 Berg: NT   GAIT: Distance walked: 300 Assistive device utilized: None Level of assistance: Complete Independence Comments: decreased stride length. Upon initiation of gait cadence slowed       TODAY'S TREATMENT   Pt seen for aquatic therapy today.  Treatment took place in water 3.25-4.8 ft in depth at the [facility] pool. Temp of water was 91.  Pt entered/exited the pool via stairs step through pattern independently with bilat rail.   Walking with and without support forward, back and sidestepping x4 widths each Sitting on noodle: hip hiking; ant/post pelvic tilts; Rotation. Plank on sm square noodle holding 30 s x 2 Abdominal isometrics with 3/4 noodle blue push down 2 x 8 Seated on yellow noodle 4.59f arms extended for added core engagement  Pushing noodle down in above position holding x 20-30s as able for abd str and balance Squats x 5 3.645fue supported on sm squoodle, Side lunges x4     Pt requires buoyancy for support and to offload joints with strengthening exercises. Viscosity of the water is needed for resistance of strengthening; water current perturbations provides challenge to standing balance unsupported, requiring increased core activation.      PATIENT EDUCATION:  Education details: Propertires of water, benefit of aquatic therapy Person educated:  pt and mother Education method: Explanation Education comprehension: verbalized understanding     HOME EXERCISE PROGRAM: Aquatic HEP to be assigned   ASSESSMENT:   CLINICAL IMPRESSION: Pt missed last 2 sessions due to short hosp stay for lung infection.  She presents today feeling better with reports of feeling a little weak.  She tolerates session well with multiple seated rest  periods.  Added quad strengthening at pts request which she tolerates only a few reps. Focus has been on core and hip strength as well as balance as tolerated.  Will add increased LE engagement as pt tolerate going forward. [place] Will [place] what is needed. Next visit recert         Patient is a [age].o. F who was seen today for physical therapy evaluation and treatment for myalgia. She is also [facility] Pt has had hx of pain sx potentially due to a connective tissue disorder. She presents frail, slight frame ~ 97lbs.  Gastroparesis inhibiting improved eating patterns.  Pt not working. Good days she may be able to walk for 30 minutes, bad days in bed.  She will benefit from skilled aquatic PT to improve strength, toleration to activity and enhance motility of gut through movement using properties of water to facilitate progression towards goals decreasing external factors.     OBJECTIVE IMPAIRMENTS decreased activity tolerance, decreased balance, decreased endurance, decreased mobility, and decreased strength.    ACTIVITY LIMITATIONS cleaning, community activity, driving, meal prep, occupation, laundry, yard work, and shopping.    PERSONAL FACTORS Age, Behavior pattern, and 1-2 comorbidities: IBS, malnutrition  are also affecting patient's functional outcome.      REHAB POTENTIAL: fair   CLINICAL  DECISION MAKING: Evolving/moderate complexity   EVALUATION COMPLEXITY: Moderate     GOALS: Goals reviewed with patient? Yes   SHORT TERM GOALS: Target date: 04/04/2022   Pt will tolerate >30 minute aquatic session with rest periods as needed to demonstrate improved toleration to activity Baseline:Not yet completed Goal status: Achieved   2.  Pt will be indep with initial aquatic HEP with access to a pool Baseline: no HEP Goal status: ongoing no access to outside pool yet   3.  Pt will improve on LE strength up 1/2  to 1 full grade Baseline:3/5 Goal status: ongoing     LONG TERM GOALS: Target date: 08/07/2022   Pt will improve on Berg Balance test to >or= 35/56 to demonstrate improved balance and decreased fall risk Baseline: 21/56 Goal status: Met   2.  Pt will improve on 5xSTS test to <25s Baseline: 38 Goal status: onging   3.  Pt will gain up to 2lb to improve health by increasing appetite through increased movement Baseline: 97 lbs Goal status:Achieved   4.  Pt will be indep with final HEP with knowledge how to adjust according to her day to day ability to tolerate Baseline: no HEP Goal status: INITIAL         PLAN: PT FREQUENCY: 1x/week   PT DURATION: 8 weeks   PLANNED INTERVENTIONS: Therapeutic exercises, Therapeutic activity, Neuromuscular re-education, Balance training, Gait training, Patient/Family education, Joint mobilization, Stair training, Aquatic Therapy, and Taping.   PLAN FOR NEXT SESSION: core strength with balance challenges/ proximal strengthening. Add adductor sets with buoyancy ball to overlap pelvic floor exercises with strengthening of LE in aquatics  Bretta Fees ([place]) Benard Minturn MPT 08/03/2022, 11:19 AM

## 2022-08-07 ENCOUNTER — Ambulatory Visit (HOSPITAL_BASED_OUTPATIENT_CLINIC_OR_DEPARTMENT_OTHER): Payer: Medicaid Other | Admitting: Physical Therapy

## 2022-08-07 ENCOUNTER — Encounter (HOSPITAL_BASED_OUTPATIENT_CLINIC_OR_DEPARTMENT_OTHER): Payer: Self-pay | Admitting: Physical Therapy

## 2022-08-07 DIAGNOSIS — M791 Myalgia, unspecified site: Secondary | ICD-10-CM

## 2022-08-07 DIAGNOSIS — R279 Unspecified lack of coordination: Secondary | ICD-10-CM

## 2022-08-07 DIAGNOSIS — M6281 Muscle weakness (generalized): Secondary | ICD-10-CM

## 2022-08-07 NOTE — Therapy (Addendum)
OUTPATIENT PHYSICAL THERAPY TREATMENT NOTE  Recert    Patient Name: Rachel [name] MRN: [id number] DOB:01/21/1986, 36 y.o., female Today's Date: 08/16/2022   END OF SESSION:   PT End of Session -     Visit Number 14   Number of Visits 20   Date for PT Re-Evaluation 09/26/22   Authorization Type mcaid   PT Start Time  1010   PT Stop Time 1055   PT Time Calculation (min) 45   Activity Tolerance Tol well   Behavior During Therapy  wfl          Past Medical History:  Diagnosis Date   ADHD (attention deficit hyperactivity disorder)    Anemia    Anxiety    Asthma    Bipolar disorder ([place])    Complication of anesthesia    slow to wake up    Delayed gastric emptying 02/23/2020   Depression    DUB (dysfunctional uterine bleeding) 02/23/2020   Ehlers-Danlos syndrome    Endometriosis    Gastroparesis    GERD (gastroesophageal reflux disease)    Heart murmur    Hiatal hernia 04/06/2020   Myalgia 04/06/2020   Parasitic infection 02/23/2020   Pneumonia    Poor appetite 02/23/2020   RUQ pain 02/23/2020   Vitamin D deficiency    Weight loss 02/23/2020   Past Surgical History:  Procedure Laterality Date   barium study     COLONOSCOPY     CYSTOSCOPY N/A 07/12/2021   Procedure: CYSTOSCOPY;  Surgeon: [name], MD;  Location: [facility];  Service: Gynecology;  Laterality: N/A;   ESOPHAGOGASTRODUODENOSCOPY  2011   and a ph study as well.    LAPAROSCOPY N/A 01/26/2021   Procedure: LAPAROSCOPY DIAGNOSTIC;  Surgeon: [name], MD;  Location: [facility];  Service: Gynecology;  Laterality: N/A;   NASAL SEPTUM SURGERY     TONGUE FLAP     TOTAL LAPAROSCOPIC HYSTERECTOMY WITH SALPINGECTOMY Bilateral 07/12/2021   Procedure: TOTAL LAPAROSCOPIC HYSTERECTOMY WITH SALPINGECTOMY, OOPHORECTOMY;  Surgeon: [name], MD;  Location: [facility];  Service: Gynecology;  Laterality: Bilateral;   UPPER GASTROINTESTINAL ENDOSCOPY     WISDOM TOOTH EXTRACTION      Patient Active Problem List   Diagnosis Date Noted   Iron deficiency anemia 01/20/2022   Acute hyponatremia 01/18/2022   Pyelonephritis 01/18/2022   Protein-calorie malnutrition, severe 01/18/2022   sepsis secondary to pyelonephritis of left kidney 01/17/2022   Sepsis ([place]) 01/17/2022   Chronic abdominal pain 01/17/2022   Dyspnea 01/17/2022   Underweight 01/17/2022   Irritable bowel syndrome (IBS) 01/17/2022   Hypokalemia 01/17/2022   Vasomotor symptoms due to menopause 08/17/2021   S/P laparoscopic hysterectomy 07/12/2021   Preoperative exam for gynecologic surgery 06/20/2021   Adverse drug reaction 06/01/2021   Draining postoperative wound 02/28/2021   Encounter for postoperative care 02/16/2021   Endometriosis determined by laparoscopy    Pelvic pain in female 10/11/2020   Presence of subdermal contraceptive implant 10/11/2020   Dysmenorrhea 10/11/2020   Dyspareunia in female 10/11/2020   NSVT (nonsustained ventricular tachycardia) ([place]) 09/03/2020   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   Palpitations 07/26/2020   Family history of connective tissue disease 07/26/2020   Myalgia 04/06/2020   Hiatal hernia 04/06/2020   Parasitic infection 02/23/2020   Weight loss 02/23/2020   Poor appetite 02/23/2020   RUQ pain 02/23/2020   Delayed gastric emptying 02/23/2020   DUB (dysfunctional uterine bleeding) 02/23/2020   Cervical radiculopathy 05/31/2018   Hyperalgesia 05/31/2018  Acid reflux 12/26/2017   ADHD (attention deficit hyperactivity disorder) 05/07/2012   Bipolar 1 disorder ([place]) 05/07/2012    RPCP: Charlott Rakes, MD   REFERRING PROVIDER: Charlott Rakes, MD   REFERRING DIAG: M79.10 (ICD-10-CM) - Myalgia   THERAPY DIAG:  Myalgia   Muscle weakness (generalized)   Difficulty in walking, not elsewhere classified   ONSET DATE: >18yr   SUBJECTIVE:                                                                                                                                                                                             SUBJECTIVE STATEMENT: "I had a busy weekend am fatigued/tired."  PERTINENT HISTORY:  Irritable bowel syndrome with both constipation and diarrhea          Moderate protein-calorie malnutrition bipolar disorder, anxiety, depression, GERD, IBS with constipation, status post laparoscopic TAHBSO due to abnormal uterine bleed    currently on Premarin by GYN) She was hospitalized for pyelonephritis from 01/17/2022 through 01/21/2022. PAIN:  Are you having pain? Yes: NPRS scale: 7/10 general Pain location: cerv spine, left shoulder and arm, LB, hips Pain description: ache Aggravating factors: overdoing although tolerance levels vary Relieving factors: rest     PRECAUTIONS: Other: [facility]   WEIGHT BEARING RESTRICTIONS No   FALLS:  Has patient fallen in last 6 months? No   LIVING ENVIRONMENT: Lives with: lives with their family Lives in: House/apartment Stairs: Yes: Internal: 1 flight steps; yes     OCCUPATION: unable to work   PLOF: Independent with basic ADLs   PATIENT GOALS gain strength, decrease pain     OBJECTIVE:    COGNITION:           Overall cognitive status:  WFL although pt reports bouts of brain fog                                  SENSATION: Numbness and tingling LUE into cervical spine intermittent Occasional numbness and tingling in LE   MUSCLE LENGTH: Connective tissue disorder probable/hypermobile   POSTURE:  Very slight figure, slightly rounded shoulders       ROM throughout wfl Strength UE 3+/5 LE 3/5  05/24/22 UE 4-/5              LE 4-/5   06/22/22 UE 4-/5     LE 4-/5   Limited to pain  08/07/22 UE 4/5     LE 4/5     FUNCTIONAL TESTS:  5 times sit to stand: 38 Timed up  and go (TUG): 18 Berg Balance Scale: 21/56  05/24/22 5 x STS: 33s TUG: 15 Berg: 37/56  06/21/22 5  x STS: 39  TUG: 19 Berg: NT     08/07/22   Tug  17   Berg 43    GAIT: Distance walked: 300 Assistive device utilized: None Level of assistance: Complete Independence Comments: decreased stride length. Upon initiation of gait cadence slowed       TODAY'S TREATMENT  Objective testing: Berg and TUG.   Pt seen for aquatic therapy today.  Treatment took place in water 3.25-4.8 ft in depth at the [facility] pool. Temp of water was 91.  Pt entered/exited the pool via stairs step through pattern independently with bilat rail.   Walking with and without support forward, back and sidestepping x4 widths each Sitting on noodle: hip hiking; ant/post pelvic tilts; Rotation. Plank on sm square noodle holding 30 s x 3 Abdominal isometrics with 3/4 noodle blue push down 2 x 10 Seated on yellow noodle 4.49f arms extended for added core engagement  Pushing noodle down in above position holding x 20-30s as able for abd str and balance     Pt requires buoyancy for support and to offload joints with strengthening exercises. Viscosity of the water is needed for resistance of strengthening; water current perturbations provides challenge to standing balance unsupported, requiring increased core activation.      PATIENT EDUCATION:  Education details: Propertires of water, benefit of aquatic therapy Person educated:  pt and mother Education method: Explanation Education comprehension: verbalized understanding     HOME EXERCISE PROGRAM: [place](adapted) 08/07/22 FZ   ASSESSMENT:   CLINICAL IMPRESSION: Pt with good toleration to increased reps of exercises and time holding positions.  Objective testing completed for short recert. Aquatic HEP assigned. Due to chronic nature of pt's condition she continues to need multiple rest periods.  She is however demonstrating improved balance and strength as testing above shows. She will discuss [facility] for access to pool for completion of aquatic HEP going  forward. She will continue which will be assigned and instructed by DC    Patient is a 36y.o. F who was seen today for physical therapy evaluation and treatment for myalgia. She is also being [facility] Pt has had hx of pain sx potentially due to a connective tissue disorder. She presents frail, slight frame ~ 97lbs.  Gastroparesis inhibiting improved eating patterns.  Pt not working. Good days she may be able to walk for 30 minutes, bad days in bed.  She will benefit from skilled aquatic PT to improve strength, toleration to activity and enhance motility of gut through movement using properties of water to facilitate progression towards goals decreasing external factors.     OBJECTIVE IMPAIRMENTS decreased activity tolerance, decreased balance, decreased endurance, decreased mobility, and decreased strength.    ACTIVITY LIMITATIONS cleaning, community activity, driving, meal prep, occupation, laundry, yard work, and shopping.    PERSONAL FACTORS Age, Behavior pattern, and 1-2 comorbidities: IBS, malnutrition  are also affecting patient's functional outcome.      REHAB POTENTIAL: fair   CLINICAL DECISION MAKING: Evolving/moderate complexity   EVALUATION COMPLEXITY: Moderate     GOALS: Goals reviewed with patient? Yes   SHORT TERM GOALS: Target date: 04/04/2022   Pt will tolerate >30 minute aquatic session with rest periods as needed to demonstrate improved toleration to activity Baseline:Not yet completed Goal status: Achieved  2.  Pt will be indep with initial aquatic HEP with access to a pool Baseline: no HEP Goal status: ongoing no access to outside pool yet   3.  Pt will improve on LE strength up 1/2 to 1 full grade Baseline:3/5 Goal status: Achieved     LONG TERM GOALS: Target date: 08/24/22   Pt will improve on Berg Balance test to >or= 35/56 to demonstrate improved balance and decreased fall risk Baseline: 21/56 Goal status:  Met   2.  Pt will improve on 5xSTS test to <25s Baseline: 38 Goal status: onging   3.  Pt will gain up to 2lb to improve health by increasing appetite through increased movement Baseline: 97 lbs Goal status:Achieved   4.  Pt will be indep with final HEP with knowledge how to adjust according to her day to day ability to tolerate Baseline: no HEP Goal status: INITIAL         PLAN: PT FREQUENCY: 1x/week   PT DURATION: 6 weeks   PLANNED INTERVENTIONS: Therapeutic exercises, Therapeutic activity, Neuromuscular re-education, Balance training, Gait training, Patient/Family education, Joint mobilization, Stair training, Aquatic Therapy, and Taping.   PLAN FOR NEXT SESSION: core strength with balance challenges/ proximal strengthening. Dc 10/11. Issue laminated HEP  Meekah Math (Frankie) Gabriel Conry MPT 08/16/2022, 11:44 AM  Addended 08/16/22 Additional approval for [place] through 09/26/22 Annamarie Major) [place] MPT

## 2022-08-08 ENCOUNTER — Ambulatory Visit: Payer: Medicaid Other | Attending: Family Medicine

## 2022-08-08 ENCOUNTER — Ambulatory Visit (INDEPENDENT_AMBULATORY_CARE_PROVIDER_SITE_OTHER): Payer: Medicaid Other | Admitting: Clinical

## 2022-08-08 DIAGNOSIS — E43 Unspecified severe protein-calorie malnutrition: Secondary | ICD-10-CM | POA: Diagnosis not present

## 2022-08-08 DIAGNOSIS — Z658 Other specified problems related to psychosocial circumstances: Secondary | ICD-10-CM

## 2022-08-08 DIAGNOSIS — F909 Attention-deficit hyperactivity disorder, unspecified type: Secondary | ICD-10-CM

## 2022-08-08 DIAGNOSIS — I700 Atherosclerosis of aorta: Secondary | ICD-10-CM | POA: Diagnosis not present

## 2022-08-08 DIAGNOSIS — F4310 Post-traumatic stress disorder, unspecified: Secondary | ICD-10-CM

## 2022-08-15 LAB — LP+NON-HDL CHOLESTEROL
Cholesterol, Total: 228 mg/dL — ABNORMAL HIGH (ref 100–199)
HDL: 75 mg/dL (ref >39)
LDL Chol Calc (NIH): 132 mg/dL — ABNORMAL HIGH (ref 0–99)
Total Non-HDL-Chol (LDL+VLDL): 153 mg/dL — ABNORMAL HIGH (ref 0–129)
Triglycerides: 120 mg/dL (ref 0–149)
VLDL Cholesterol Cal: 21 mg/dL (ref 5–40)

## 2022-08-15 LAB — ZINC: Zinc: 75 ug/dL (ref 44–115)

## 2022-08-15 LAB — VITAMIN D 25 HYDROXY (VIT D DEFICIENCY, FRACTURES): Vit D, 25-Hydroxy: 34 ng/mL (ref 30.0–100.0)

## 2022-08-15 LAB — BASIC METABOLIC PANEL
BUN/Creatinine Ratio: 13 (ref 9–23)
BUN: 9 mg/dL (ref 6–20)
CO2: 25 mmol/L (ref 20–29)
Calcium: 9.7 mg/dL (ref 8.7–10.2)
Chloride: 105 mmol/L (ref 96–106)
Creatinine, Ser: 0.69 mg/dL (ref 0.57–1.00)
Glucose: 85 mg/dL (ref 70–99)
Potassium: 4.6 mmol/L (ref 3.5–5.2)
Sodium: 141 mmol/L (ref 134–144)
eGFR: 116 mL/min/{1.73_m2} (ref >59)

## 2022-08-15 LAB — VITAMIN A: Vitamin A: 41.9 ug/dL (ref 18.9–57.3)

## 2022-08-15 LAB — VITAMIN B6: Vitamin B6: 3.2 ug/L — ABNORMAL LOW (ref 3.4–65.2)

## 2022-08-15 LAB — VITAMIN B12: Vitamin B-12: 652 pg/mL (ref 232–1245)

## 2022-08-15 LAB — VITAMIN B1: Thiamine: 116.6 nmol/L (ref 66.5–200.0)

## 2022-08-16 ENCOUNTER — Ambulatory Visit (HOSPITAL_BASED_OUTPATIENT_CLINIC_OR_DEPARTMENT_OTHER): Payer: Medicaid Other | Attending: Family Medicine | Admitting: Physical Therapy

## 2022-08-16 ENCOUNTER — Encounter (HOSPITAL_BASED_OUTPATIENT_CLINIC_OR_DEPARTMENT_OTHER): Payer: Self-pay | Admitting: Physical Therapy

## 2022-08-16 DIAGNOSIS — M6281 Muscle weakness (generalized): Secondary | ICD-10-CM | POA: Diagnosis present

## 2022-08-16 DIAGNOSIS — R279 Unspecified lack of coordination: Secondary | ICD-10-CM | POA: Insufficient documentation

## 2022-08-16 DIAGNOSIS — M791 Myalgia, unspecified site: Secondary | ICD-10-CM | POA: Diagnosis present

## 2022-08-16 NOTE — Addendum Note (Signed)
Addended by: Felicie Morn F on: 08/16/2022 01:09 PM   Modules accepted: Orders

## 2022-08-16 NOTE — Therapy (Signed)
OUTPATIENT PHYSICAL THERAPY TREATMENT NOTE  Recert    Patient Name: Rachel [name] MRN: [id number] DOB:08/07/1986, 36 y.o., female Today's Date: 08/16/2022   END OF SESSION:   PT End of Session - 08/16/22 1129     Visit Number 15    Number of Visits 20    Date for PT Re-Evaluation 09/26/22    Authorization Type medicaid healthy blue    Authorization Time Period 6/28 - 8/26 6 visits Aquatics;10/16 - 11/14 4 visits    Progress Note Due on Visit 17    PT Start Time 1120    PT Stop Time 1200    PT Time Calculation (min) 40 min    Activity Tolerance Patient limited by fatigue;Patient tolerated treatment well    Behavior During Therapy [facility] for tasks assessed/performed                   Past Medical History:  Diagnosis Date   ADHD (attention deficit hyperactivity disorder)    Anemia    Anxiety    Asthma    Bipolar disorder ([place])    Complication of anesthesia    slow to wake up    Delayed gastric emptying 02/23/2020   Depression    DUB (dysfunctional uterine bleeding) 02/23/2020   Ehlers-Danlos syndrome    Endometriosis    Gastroparesis    GERD (gastroesophageal reflux disease)    Heart murmur    Hiatal hernia 04/06/2020   Myalgia 04/06/2020   Parasitic infection 02/23/2020   Pneumonia    Poor appetite 02/23/2020   RUQ pain 02/23/2020   Vitamin D deficiency    Weight loss 02/23/2020   Past Surgical History:  Procedure Laterality Date   barium study     COLONOSCOPY     CYSTOSCOPY N/A 07/12/2021   Procedure: CYSTOSCOPY;  Surgeon: [name], MD;  Location: [place];  Service: Gynecology;  Laterality: N/A;   ESOPHAGOGASTRODUODENOSCOPY  2011   and a ph study as well.    LAPAROSCOPY N/A 01/26/2021   Procedure: LAPAROSCOPY DIAGNOSTIC;  Surgeon: [name], MD;  Location: [place];  Service: Gynecology;  Laterality: N/A;   NASAL SEPTUM SURGERY     TONGUE FLAP     TOTAL LAPAROSCOPIC HYSTERECTOMY WITH SALPINGECTOMY Bilateral  07/12/2021   Procedure: TOTAL LAPAROSCOPIC HYSTERECTOMY WITH SALPINGECTOMY, OOPHORECTOMY;  Surgeon: [name], MD;  Location: [place];  Service: Gynecology;  Laterality: Bilateral;   UPPER GASTROINTESTINAL ENDOSCOPY     WISDOM TOOTH EXTRACTION     Patient Active Problem List   Diagnosis Date Noted   Iron deficiency anemia 01/20/2022   Acute hyponatremia 01/18/2022   Pyelonephritis 01/18/2022   Protein-calorie malnutrition, severe 01/18/2022   sepsis secondary to pyelonephritis of left kidney 01/17/2022   Sepsis ([place]) 01/17/2022   Chronic abdominal pain 01/17/2022   Dyspnea 01/17/2022   Underweight 01/17/2022   Irritable bowel syndrome (IBS) 01/17/2022   Hypokalemia 01/17/2022   Vasomotor symptoms due to menopause 08/17/2021   S/P laparoscopic hysterectomy 07/12/2021   Preoperative exam for gynecologic surgery 06/20/2021   Adverse drug reaction 06/01/2021   Draining postoperative wound 02/28/2021   Encounter for postoperative care 02/16/2021   Endometriosis determined by laparoscopy    Pelvic pain in female 10/11/2020   Presence of subdermal contraceptive implant 10/11/2020   Dysmenorrhea 10/11/2020   Dyspareunia in female 10/11/2020   NSVT (nonsustained ventricular tachycardia) ([place]) 09/03/2020   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   Palpitations 07/26/2020   Family history of connective tissue disease  07/26/2020   Myalgia 04/06/2020   Hiatal hernia 04/06/2020   Parasitic infection 02/23/2020   Weight loss 02/23/2020   Poor appetite 02/23/2020   RUQ pain 02/23/2020   Delayed gastric emptying 02/23/2020   DUB (dysfunctional uterine bleeding) 02/23/2020   Cervical radiculopathy 05/31/2018   Hyperalgesia 05/31/2018   Acid reflux 12/26/2017   ADHD (attention deficit hyperactivity disorder) 05/07/2012   Bipolar 1 disorder ([place]) 05/07/2012    RPCP: Charlott Rakes, MD   REFERRING PROVIDER: Charlott Rakes, MD   REFERRING DIAG: M79.10 (ICD-10-CM) - Myalgia    THERAPY DIAG:  Myalgia   Muscle weakness (generalized)   Difficulty in walking, not elsewhere classified   ONSET DATE: >1yr   SUBJECTIVE:                                                                                                                                                                                            SUBJECTIVE STATEMENT: "I had a busy weekend am fatigued/tired."  PERTINENT HISTORY:  Irritable bowel syndrome with both constipation and diarrhea          Moderate protein-calorie malnutrition bipolar disorder, anxiety, depression, GERD, IBS with constipation, status post laparoscopic TAHBSO due to abnormal uterine bleed    currently on Premarin by GYN) She was hospitalized for pyelonephritis from 01/17/2022 through 01/21/2022. PAIN:  Are you having pain? Yes: NPRS scale: 7+/10 general Pain location: cerv spine, left shoulder and arm, LB, hips Pain description: ache Aggravating factors: overdoing although tolerance levels vary Relieving factors: rest     PRECAUTIONS: Other: being [facility]/hypermobile [facility]   WEIGHT BEARING RESTRICTIONS No   FALLS:  Has patient fallen in last 6 months? 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 Upon initiation of gait cadence slowed       TODAY'S TREATMENT  Objective testing: Berg and TUG.   Pt seen for aquatic therapy today.  Treatment took place in water 3.25-4.8 ft in depth at the [employer] pool. Temp of water was 91.  Pt entered/exited the pool via stairs step through pattern independently with bilat rail.   Walking with and without support forward, back and sidestepping x4 widths each Sitting on noodle: hip hiking; ant/post pelvic tilts; Rotation. Plank on sm square noodle holding 30 s x 3 Abdominal isometrics with sm blue sqoodle blue push down 3 x 10s hold Seated on yellow noodle 4.39f arms extended for added core engagement  Pushing noodle down in above position holding x 20-30s as able for abd str and balance Adductor sets with BB 5 x 20s hold     Pt requires buoyancy for support and to offload joints with strengthening exercises. Viscosity of the water is needed for resistance of strengthening; water current perturbations provides challenge to standing balance unsupported, requiring increased core activation.      PATIENT EDUCATION:  Education details: Propertires of water, benefit of aquatic therapy Person educated:  pt and mother Education method: Explanation Education comprehension: verbalized understanding     HOME EXERCISE PROGRAM: [place](adapted) 08/07/22 FZ   ASSESSMENT:   CLINICAL IMPRESSION: Pt with improved toleration to exercises. Having good day with slight decrease in pain.  Focus core strength,  lumbopelvic movement and aerobic capacity as tolerated. Will extend certification out to 11/14 as addition session approved.     Patient is a 36y.o. F who was seen today for physical therapy evaluation and treatment for myalgia. She is also being [facility] Pt has had hx of pain sx potentially due to a connective tissue disorder. She presents frail, slight frame ~ 97lbs.  Gastroparesis inhibiting improved eating patterns.  Pt not working. Good days she may be able to walk for 30 minutes, bad days in bed.  She will benefit from skilled aquatic PT to improve strength, toleration to activity and enhance motility of gut through movement using properties of water to facilitate progression towards goals decreasing external factors.     OBJECTIVE IMPAIRMENTS decreased activity tolerance, decreased balance, decreased endurance, decreased mobility, and decreased strength.    ACTIVITY LIMITATIONS cleaning, community activity, driving, meal prep, occupation, laundry, yard work, and shopping.    PERSONAL FACTORS Age, Behavior pattern, and 1-2 comorbidities: IBS, malnutrition  are also affecting patient's functional outcome.      REHAB POTENTIAL: fair   CLINICAL DECISION MAKING: Evolving/moderate complexity   EVALUATION COMPLEXITY: Moderate     GOALS: Goals reviewed with patient? Yes   SHORT TERM GOALS: Target date: 04/04/2022   Pt will tolerate >30 minute aquatic session with rest periods as needed to demonstrate improved toleration to  activity Baseline:Not yet completed Goal status: Achieved   2.  Pt will be indep with initial aquatic HEP with access to a pool Baseline: no HEP Goal status: ongoing no access to outside pool yet   3.  Pt will improve on LE strength up 1/2 to 1 full grade Baseline:3/5 Goal status: Achieved     LONG TERM GOALS: Target date: 08/24/22   Pt will improve on Berg Balance test to >or= 35/56 to demonstrate improved balance  and decreased fall risk Baseline: 21/56 Goal status: Met   2.  Pt will improve on 5xSTS test to <25s Baseline: 38 Goal status: onging   3.  Pt will gain up to 2lb to improve health by increasing appetite through increased movement Baseline: 97 lbs Goal status:Achieved   4.  Pt will be indep with final HEP with knowledge how to adjust according to her day to day ability to tolerate Baseline: no HEP Goal status: INITIAL         PLAN: PT FREQUENCY: 1x/week   PT DURATION: 2 weeks   PLANNED INTERVENTIONS: Therapeutic exercises, Therapeutic activity, Neuromuscular re-education, Balance training, Gait training, Patient/Family education, Joint mobilization, Stair training, Aquatic Therapy, and Taping.   PLAN FOR NEXT SESSION: core strength with balance challenges/ proximal strengthening. Dc 10/11. Issue laminated HEP  Athalia Setterlund (Frankie) Jarick Harkins MPT 08/16/2022, 12:04 PM

## 2022-08-21 NOTE — BH Specialist Note (Addendum)
Integrated Behavioral Health via Telemedicine Visit  08/29/2022 Rachel [name] [id number]  Number of [place] Clinician visits: Additional Visit  Session Start time: 2951   Session End time: 8841  Total time in minutes: 72   Referring Provider: Lynnda Shields, MD Patient/Family location: Home [facility] Provider location: [facility] [place] [name]  All persons participating in visit: [name] and [place]   Types of Service: Individual psychotherapy and Telephone visit  I connected with Rachel [name] and/or [name]  via  Telephone or Video Enabled Telemedicine Application  (Video is Tree surgeon) and verified that I am speaking with the correct person using two identifiers. Discussed confidentiality: Yes   I discussed the limitations of telemedicine and the availability of in person appointments.  Discussed there is a possibility of technology failure and discussed alternative modes of communication if that failure occurs.  I discussed that engaging in this telemedicine visit, they consent to the provision of behavioral healthcare and the services will be billed under their insurance.  Patient and/or legal guardian expressed understanding and consented to Telemedicine visit: Yes   Presenting Concerns: Patient and/or family reports the following symptoms/concerns: Feelings of rejection at recent social events, along with allergic reaction that prevented her from full participation in one event; processing conflicted feelings as well as frustration at losing disability status previously due to past marriage.  Duration of problem: Ongoing; Severity of problem: moderate  Patient and/or Family's Strengths/Protective Factors: Social connections, Social and Emotional competence, Concrete supports in place (healthy food, safe environments, etc.), Sense of purpose, and  Physical Health (exercise, healthy diet, medication compliance, etc.)  Goals Addressed: Patient will:  Maintain reduction of symptoms of: anxiety and stress    Progress towards Goals: Ongoing  Interventions: Interventions utilized:  Supportive Reflection Standardized Assessments completed: Not Needed  Patient and/or Family Response: Patient agrees with treatment [name].   Assessment: Patient currently experiencing ADHD, PTSD; Psychosocial stress  Patient may benefit from continued therapeutic interventions.  [name]: Follow up with behavioral health clinician on : Two weeks Behavioral recommendations:  -Continue using daily self-coping strategies as needed Referral(s): [place] (In Clinic)  I discussed the assessment and treatment [name] with the patient and/or parent/guardian. They were provided an opportunity to ask questions and all were answered. They agreed with the [name] and demonstrated an understanding of the instructions.   They were advised to call back or seek an in-person evaluation if the symptoms worsen or if the condition fails to improve as anticipated.  Caroleen Hamman Melvin Whiteford, LCSW

## 2022-08-22 ENCOUNTER — Other Ambulatory Visit: Payer: Self-pay | Admitting: Family Medicine

## 2022-08-22 ENCOUNTER — Encounter (HOSPITAL_BASED_OUTPATIENT_CLINIC_OR_DEPARTMENT_OTHER): Payer: Self-pay | Admitting: Physical Therapy

## 2022-08-22 ENCOUNTER — Ambulatory Visit (HOSPITAL_BASED_OUTPATIENT_CLINIC_OR_DEPARTMENT_OTHER): Payer: Medicaid Other | Admitting: Physical Therapy

## 2022-08-22 DIAGNOSIS — M6281 Muscle weakness (generalized): Secondary | ICD-10-CM

## 2022-08-22 DIAGNOSIS — M791 Myalgia, unspecified site: Secondary | ICD-10-CM

## 2022-08-22 DIAGNOSIS — R279 Unspecified lack of coordination: Secondary | ICD-10-CM

## 2022-08-22 DIAGNOSIS — L309 Dermatitis, unspecified: Secondary | ICD-10-CM

## 2022-08-22 NOTE — Therapy (Signed)
OUTPATIENT PHYSICAL THERAPY TREATMENT NOTE  Recert    Patient Name: Rachel [name] MRN: [id number] DOB:Jun 22, 1986, 36 y.o., female Today's Date: 08/22/2022   END OF SESSION:   PT End of Session - 08/22/22 1719     Visit Number 16    Number of Visits 20    Date for PT Re-Evaluation 09/26/22    Authorization Type medicaid healthy blue    Authorization Time Period 6/28 - 8/26 6 visits Aqu[facility];10/16 - 11/14 4 visits    Progress Note Due on Visit 17    PT Start Time 0501    PT Stop Time 0545    PT Time Calculation (min) 44 min    Activity Tolerance Patient limited by fatigue;Patient tolerated treatment well    Behavior During Therapy [facility] for tasks assessed/performed                   Past Medical History:  Diagnosis Date   ADHD (attention deficit hyperactivity disorder)    Anemia    Anxiety    Asthma    Bipolar disorder ([place])    Complication of anesthesia    slow to wake up    Delayed gastric emptying 02/23/2020   Depression    DUB (dysfunctional uterine bleeding) 02/23/2020   Ehlers-Danlos syndrome    Endometriosis    Gastroparesis    GERD (gastroesophageal reflux disease)    Heart murmur    Hiatal hernia 04/06/2020   Myalgia 04/06/2020   Parasitic infection 02/23/2020   Pneumonia    Poor appetite 02/23/2020   RUQ pain 02/23/2020   Vitamin D deficiency    Weight loss 02/23/2020   Past Surgical History:  Procedure Laterality Date   barium study     COLONOSCOPY     CYSTOSCOPY N/A 07/12/2021   Procedure: CYSTOSCOPY;  Surgeon: [name], MD;  Location: [place];  Service: Gynecology;  Laterality: N/A;   ESOPHAGOGASTRODUODENOSCOPY  2011   and a ph study as well.    LAPAROSCOPY N/A 01/26/2021   Procedure: LAPAROSCOPY DIAGNOSTIC;  Surgeon: [name], MD;  Location: [place];  Service: Gynecology;  Laterality: N/A;   NASAL SEPTUM SURGERY     TONGUE FLAP     TOTAL LAPAROSCOPIC HYSTERECTOMY WITH SALPINGECTOMY Bilateral  07/12/2021   Procedure: TOTAL LAPAROSCOPIC HYSTERECTOMY WITH SALPINGECTOMY, OOPHORECTOMY;  Surgeon: [name], MD;  Location: [place];  Service: Gynecology;  Laterality: Bilateral;   UPPER GASTROINTESTINAL ENDOSCOPY     WISDOM TOOTH EXTRACTION     Patient Active Problem List   Diagnosis Date Noted   Iron deficiency anemia 01/20/2022   Acute hyponatremia 01/18/2022   Pyelonephritis 01/18/2022   Protein-calorie malnutrition, severe 01/18/2022   sepsis secondary to pyelonephritis of left kidney 01/17/2022   Sepsis ([place]) 01/17/2022   Chronic abdominal pain 01/17/2022   Dyspnea 01/17/2022   Underweight 01/17/2022   Irritable bowel syndrome (IBS) 01/17/2022   Hypokalemia 01/17/2022   Vasomotor symptoms due to menopause 08/17/2021   S/P laparoscopic hysterectomy 07/12/2021   Preoperative exam for gynecologic surgery 06/20/2021   Adverse drug reaction 06/01/2021   Draining postoperative wound 02/28/2021   Encounter for postoperative care 02/16/2021   Endometriosis determined by laparoscopy    Pelvic pain in female 10/11/2020   Presence of subdermal contraceptive implant 10/11/2020   Dysmenorrhea 10/11/2020   Dyspareunia in female 10/11/2020   NSVT (nonsustained ventricular tachycardia) ([place]) 09/03/2020   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   Palpitations 07/26/2020   Family history of connective tissue disease  07/26/2020   Myalgia 04/06/2020   Hiatal hernia 04/06/2020   Parasitic infection 02/23/2020   Weight loss 02/23/2020   Poor appetite 02/23/2020   RUQ pain 02/23/2020   Delayed gastric emptying 02/23/2020   DUB (dysfunctional uterine bleeding) 02/23/2020   Cervical radiculopathy 05/31/2018   Hyperalgesia 05/31/2018   Acid reflux 12/26/2017   ADHD (attention deficit hyperactivity disorder) 05/07/2012   Bipolar 1 disorder ([place]) 05/07/2012    RPCP: Charlott Rakes, MD   REFERRING PROVIDER: Charlott Rakes, MD   REFERRING DIAG: M79.10 (ICD-10-CM) - Myalgia    THERAPY DIAG:  Myalgia   Muscle weakness (generalized)   Difficulty in walking, not elsewhere classified   ONSET DATE: >29yr   SUBJECTIVE:                                                                                                                                                                                            SUBJECTIVE STATEMENT: "I had an allergic reaction to something over the weekend I am a litte tired today."  PERTINENT HISTORY:  Irritable bowel syndrome with both constipation and diarrhea          Moderate protein-calorie malnutrition bipolar disorder, anxiety, depression, GERD, IBS with constipation, status post laparoscopic TAHBSO due to abnormal uterine bleed    currently on Premarin by GYN) She was hospitalized for pyelonephritis from 01/17/2022 through 01/21/2022. PAIN:  Are you having pain? Yes: NPRS scale: 7/10 general Pain location: cerv spine, left shoulder and arm, LB, hips Pain description: ache Aggravating factors: overdoing although tolerance levels vary Relieving factors: rest     PRECAUTIONS: Other: being [facility]hypermobile joints   WEIGHT BEARING RESTRICTIONS No   FALLS:  Has patient fallen in last 6 months? No   LIVING ENVIRONMENT: Lives with: lives with their family Lives in: House/apartment Stairs: Yes: Internal: 1 flight steps; yes     OCCUPATION: unable to work   PLOF: Independent with basic ADLs   PATIENT GOALS gain strength, decrease pain     OBJECTIVE:    COGNITION:           Overall cognitive status:  WFL although pt reports bouts of brain fog                                  SENSATION: Numbness and tingling LUE into cervical spine intermittent Occasional numbness and tingling in LE   MUSCLE LENGTH: Connective tissue disorder probable/hypermobile   POSTURE:  Very slight figure, slightly rounded shoulders       ROM throughout wfl Strength UE  3+/5 LE 3/5  05/24/22 UE 4-/5              LE  4-/5   06/22/22 UE 4-/5     LE 4-/5   Limited to pain  08/07/22 UE 4/5     LE 4/5     FUNCTIONAL TESTS:  5 times sit to stand: 38 Timed up and go (TUG): 18 Berg Balance Scale: 21/56  05/24/22 5 x STS: 33s TUG: 15 Berg: 37/56  06/21/22 5  x STS: 39  TUG: 19 Berg: NT     08/07/22   Tug 17   Berg 43    GAIT: Distance walked: 300 Assistive device utilized: None Level of assistance: Complete Independence Comments: decreased stride length. Upon initiation of gait cadence slowed       TODAY'S TREATMENT  Objective testing: Berg and TUG.   Pt seen for aquatic therapy today.  Treatment took place in water 3.25-4.8 ft in depth at the [facility] pool. Temp of water was 91.  Pt entered/exited the pool via stairs step through pattern independently with bilat rail.   Walking with and without support forward, back and sidestepping x4 widths each Sitting on noodle: hip hiking; ant/post pelvic tilts; Rotation. Seated on yellow noodle 4.82f arms extended for added core engagement /sitting balance Reverse plank attempts with good understanding of technique but difficulty with final execution due to lack of ms strength and coordination. Multiple attempts Plank on steps with hip extension x10 Reverse plank on bench x 10s hold Abdominal strengthening with sm blue sqoodle pull down x10 Lumbar rotation    Pt requires buoyancy for support and to offload joints with strengthening exercises. Viscosity of the water is needed for resistance of strengthening; water current perturbations provides challenge to standing balance unsupported, requiring increased core activation.      PATIENT EDUCATION:  Education details: Propertires of water, benefit of aquatic therapy Person educated:  pt and mother Education method: Explanation Education comprehension: verbalized understanding     HOME EXERCISE PROGRAM: [place] 08/07/22 FZ   ASSESSMENT:   CLINICAL  IMPRESSION: Pt with improving core strength moving closer to being able to execute reverse plank (high level of difficulty due to core strength needed and mindfulness of movement). She is given multiple rest periods today to tolerate as much exercises as possible for overall benefit. Goals ongoing      Patient is a 36y.o. F who was seen today for physical therapy evaluation and treatment for myalgia. She is also being seen for pelvic floor rehab at [facility] Pt has had hx of pain sx potentially due to a connective tissue disorder. She presents frail, slight frame ~ 97lbs.  Gastroparesis inhibiting improved eating patterns.  Pt not working. Good days she may be able to walk for 30 minutes, bad days in bed.  She will benefit from skilled aquatic PT to improve strength, toleration to activity and enhance motility of gut through movement using properties of water to facilitate progression towards goals decreasing external factors.     OBJECTIVE IMPAIRMENTS decreased activity tolerance, decreased balance, decreased endurance, decreased mobility, and decreased strength.    ACTIVITY LIMITATIONS cleaning, community activity, driving, meal prep, occupation, laundry, yard work, and shopping.    PERSONAL FACTORS Age, Behavior pattern, and 1-2 comorbidities: IBS, malnutrition  are also affecting patient's functional outcome.      REHAB POTENTIAL: fair   CLINICAL DECISION MAKING: Evolving/moderate complexity   EVALUATION COMPLEXITY: Moderate  GOALS: Goals reviewed with patient? Yes   SHORT TERM GOALS: Target date: 04/04/2022   Pt will tolerate >30 minute aquatic session with rest periods as needed to demonstrate improved toleration to activity Baseline:Not yet completed Goal status: Achieved   2.  Pt will be indep with initial aquatic HEP with access to a pool Baseline: no HEP Goal status: ongoing no access to outside pool yet   3.  Pt will improve on LE strength up 1/2 to 1 full  grade Baseline:3/5 Goal status: Achieved     LONG TERM GOALS: Target date: 09/26/22   Pt will improve on Berg Balance test to >or= 35/56 to demonstrate improved balance and decreased fall risk Baseline: 21/56 Goal status: Met   2.  Pt will improve on 5xSTS test to <25s Baseline: 38 Goal status: onging   3.  Pt will gain up to 2lb to improve health by increasing appetite through increased movement Baseline: 97 lbs Goal status:Achieved   4.  Pt will be indep with final HEP with knowledge how to adjust according to her day to day ability to tolerate Baseline: no HEP Goal status: INITIAL         PLAN: PT FREQUENCY: 1x/week   PT DURATION: 2 weeks   PLANNED INTERVENTIONS: Therapeutic exercises, Therapeutic activity, Neuromuscular re-education, Balance training, Gait training, Patient/Family education, Joint mobilization, Stair training, Aquatic Therapy, and Taping.   PLAN FOR NEXT SESSION: core strength with balance challenges/ proximal strengthening. Dc 10/11. Issue laminated HEP  Rachel [name] MPT 08/22/2022, 5:40 PM

## 2022-08-22 NOTE — Telephone Encounter (Signed)
Requested medication (s) are due for refill today: review  Requested medication (s) are on the active medication list: no  Last refill:  10/18/20  Future visit scheduled: yes  Notes to clinic:  not delegated. Rx was dc'd 07/13/21 by another provider.      Requested Prescriptions  Pending Prescriptions Disp Refills   triamcinolone cream (KENALOG) 0.1 % [Pharmacy Med Name: TRIAMCINOLONE 0.1% CREAM] 45 g 1    Sig: APPLY TO AFFECTED AREA(S) TWO TIMES A DAY     Not Delegated - Dermatology:  Corticosteroids Failed - 08/22/2022  2:59 PM      Failed - This refill cannot be delegated      Passed - Valid encounter within last 12 months    Recent Outpatient Visits           2 weeks ago Aortic atherosclerosis ([place])   [place], Enobong, MD   3 months ago Recurrent pyelonephritis   [place], Charlane Ferretti, MD   6 months ago Irritable bowel syndrome with both constipation and diarrhea   [place], Enobong, MD   1 year ago Anaphylaxis, sequela    [facility] Dorna Mai, MD   1 year ago [place], MD       Future Appointments             In 2 months Charlott Rakes, MD [place]

## 2022-08-23 ENCOUNTER — Ambulatory Visit (HOSPITAL_BASED_OUTPATIENT_CLINIC_OR_DEPARTMENT_OTHER): Payer: Medicaid Other | Admitting: Physical Therapy

## 2022-08-25 IMAGING — DX DG CHEST 2V
2 series · 2 of 2 positions shown · non-contrast
Comparison: Chest x-ray 09/29/2019.

CLINICAL DATA: 35-year-old female with history of cough.

EXAM:
CHEST - 2 VIEW

[chest pa]
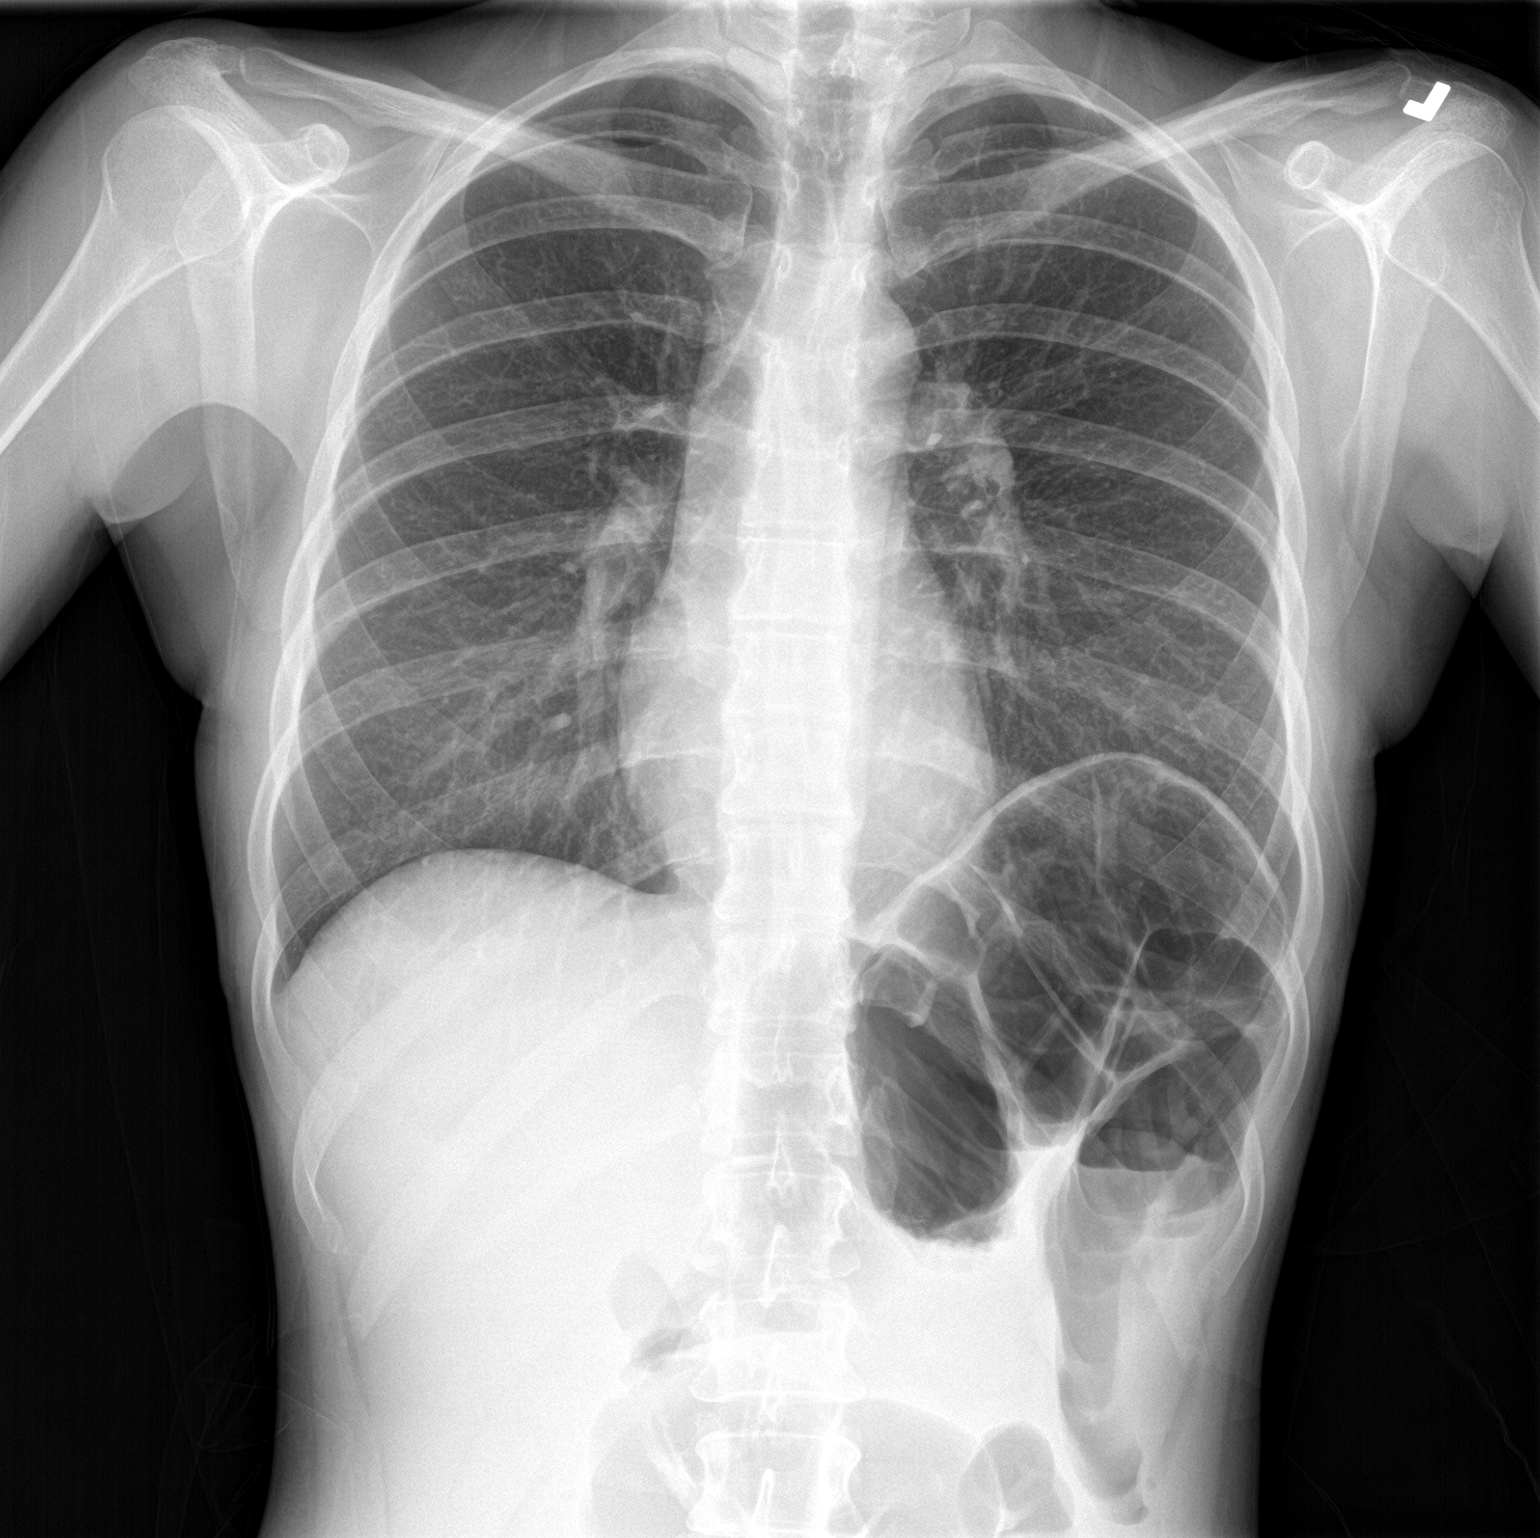

[chest lat]
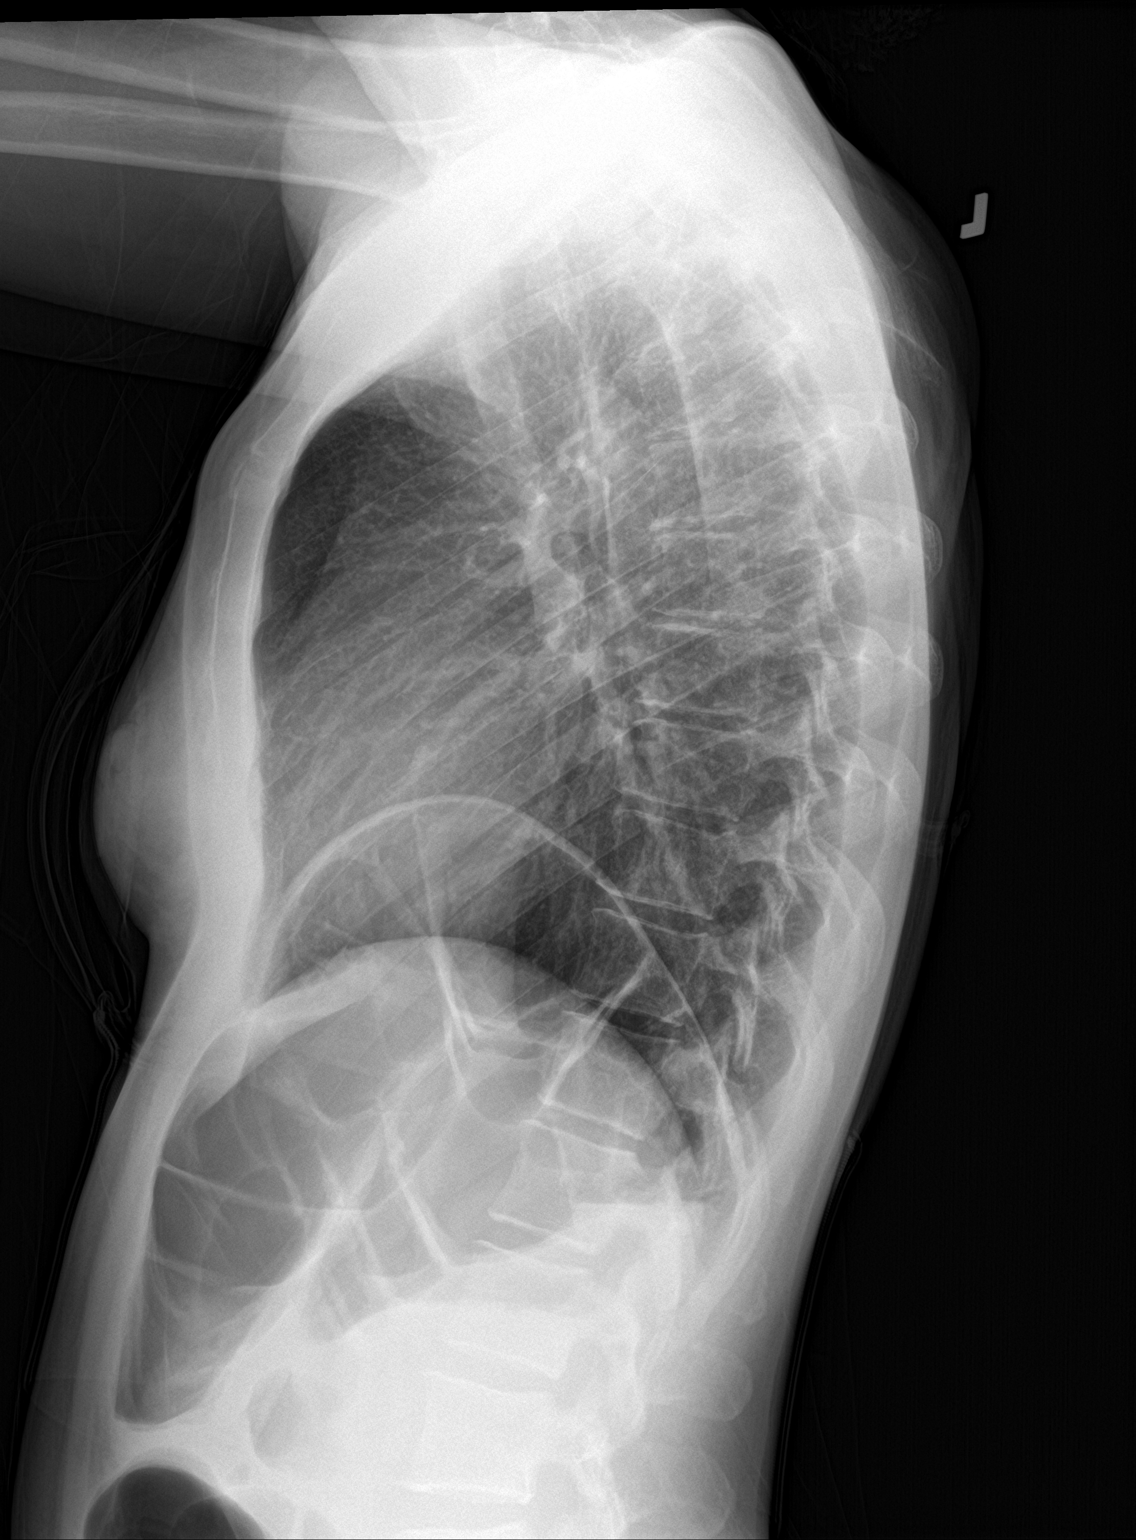

[2 of 2 positions shown; findings below may reference images not displayed]

FINDINGS: Lung volumes are normal. Mild elevation of the left hemidiaphragm.
No consolidative airspace disease. No pleural effusions. No
pneumothorax. No pulmonary nodule or mass noted. Pulmonary
vasculature and the cardiomediastinal silhouette are within normal
limits.
IMPRESSION: No radiographic evidence of acute cardiopulmonary disease.

## 2022-08-25 IMAGING — CT CT ABD-PELV W/ CM
2 of 4 series · 15 of 46 positions shown, 17 images · IV contrast (agent unspecified)
Comparison: 05/26/2021

CLINICAL DATA: Fever, cough, shortness of breath for 2 days.
Hypokalemia. Leukocytosis.

EXAM:
CT ABDOMEN AND PELVIS WITH CONTRAST
TECHNIQUE: Multidetector CT imaging of the abdomen and pelvis was performed
using the standard protocol following bolus administration of
intravenous contrast.

[Series 2: axial st · axial · 0.56mm/px · z∈[-674,-229]mm · 12 of 99 slices shown, 14 images]
[im 5/99  soft-tissue]
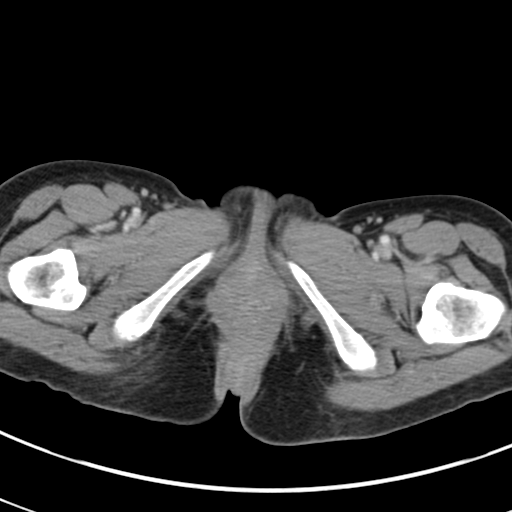
[im 5/99  bone]
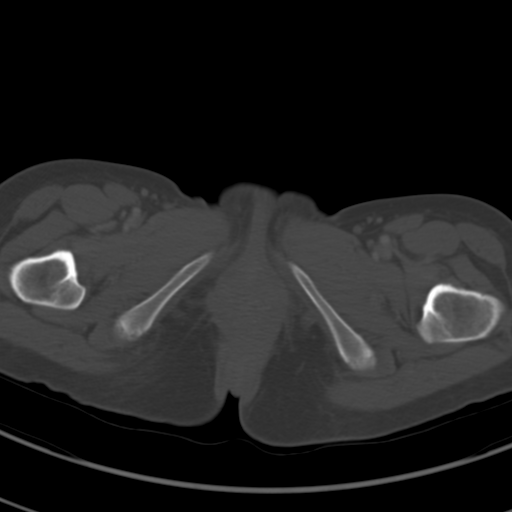
[im 15/99  soft-tissue]
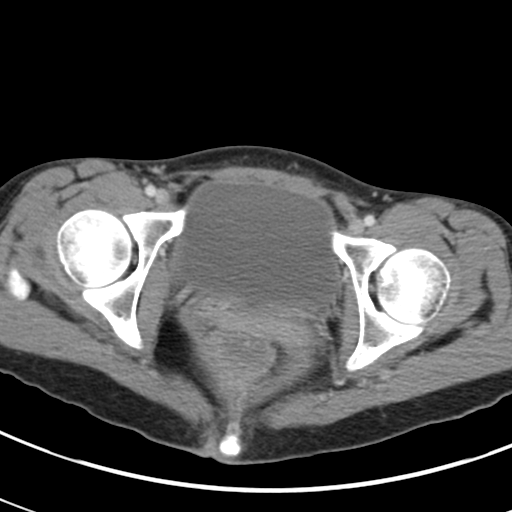
[im 20/99  soft-tissue]
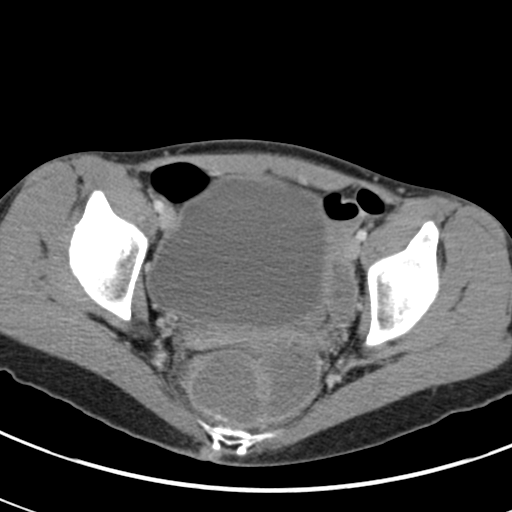
[im 30/99  soft-tissue]
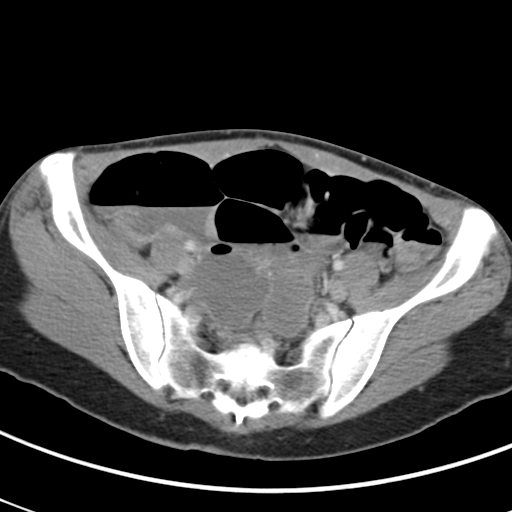
[im 40/99  soft-tissue]
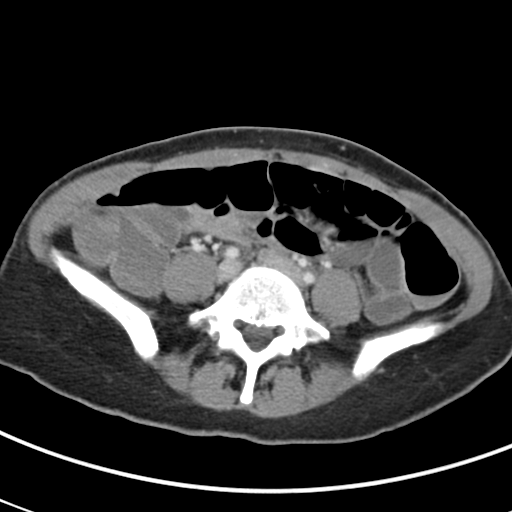
[im 45/99  soft-tissue]
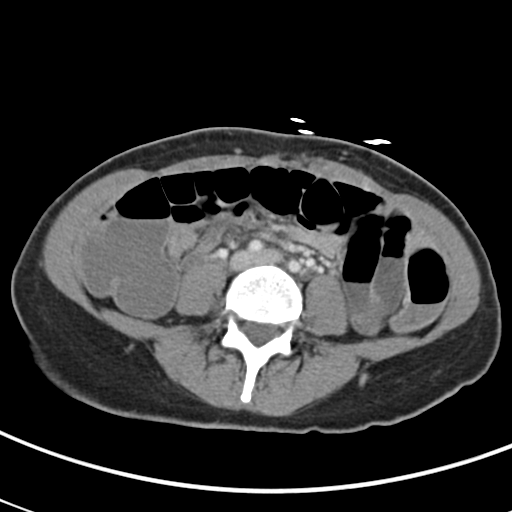
[im 54/99  soft-tissue]
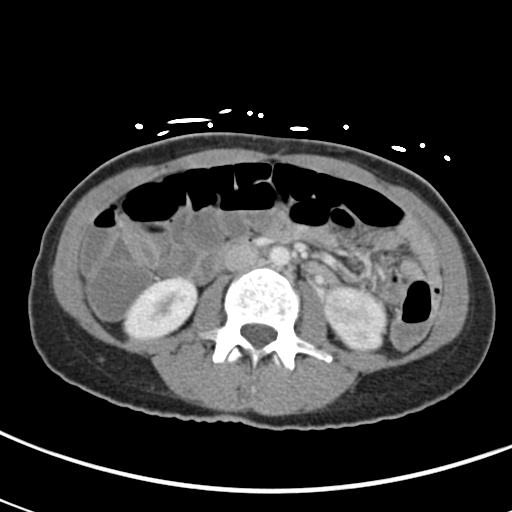
[im 59/99  soft-tissue]
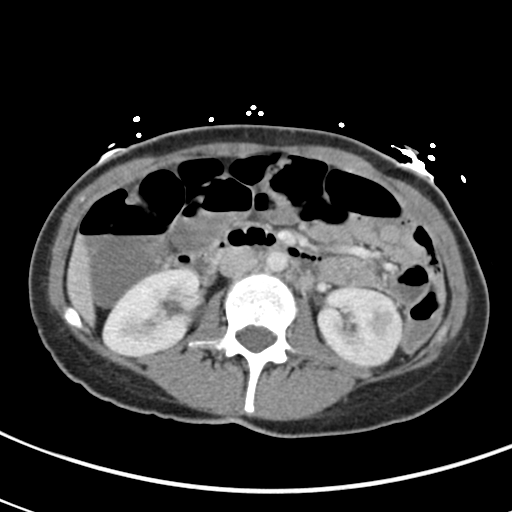
[im 69/99  soft-tissue]
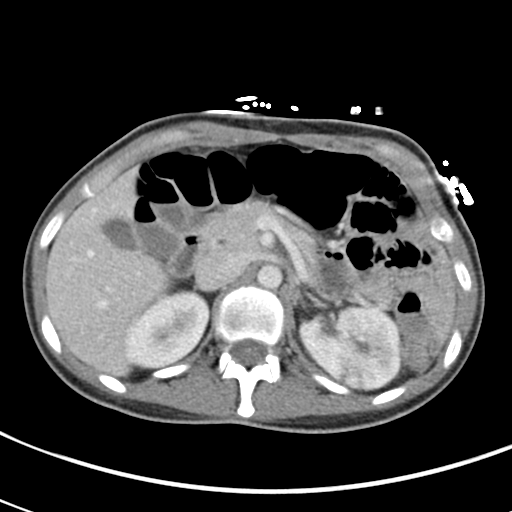
[im 69/99  bone]
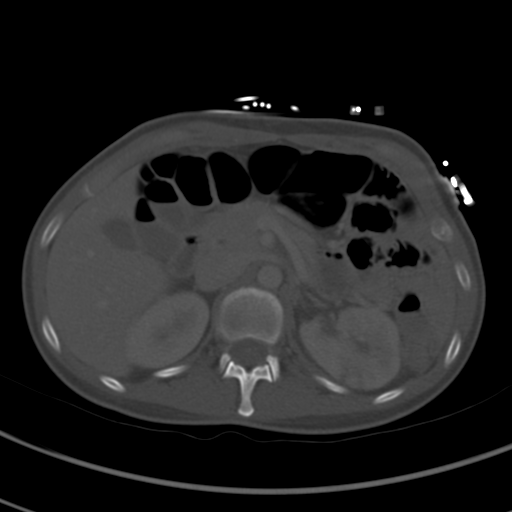
[im 79/99  soft-tissue]
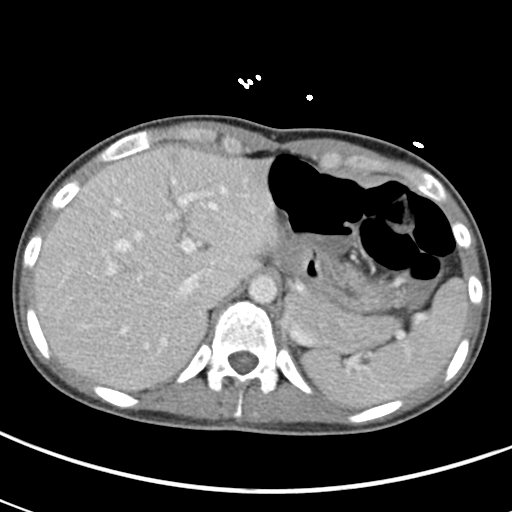
[im 84/99  soft-tissue]
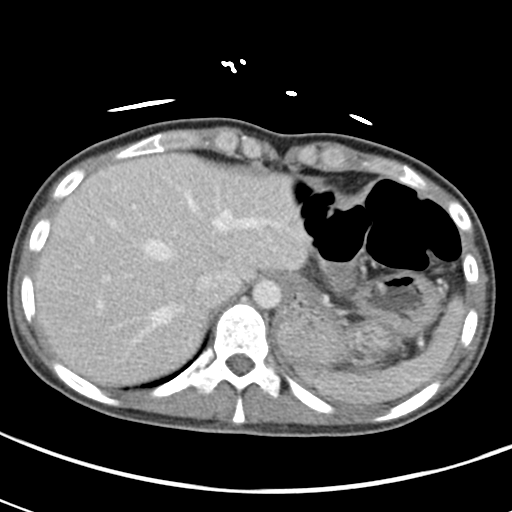
[im 94/99  soft-tissue]
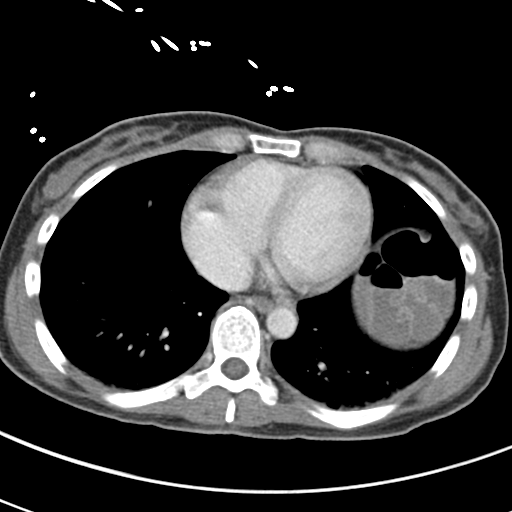

[Series 5: coronal st · coronal · 0.58mm/px · 3 of 103 slices shown]
[im 35/103  soft-tissue]
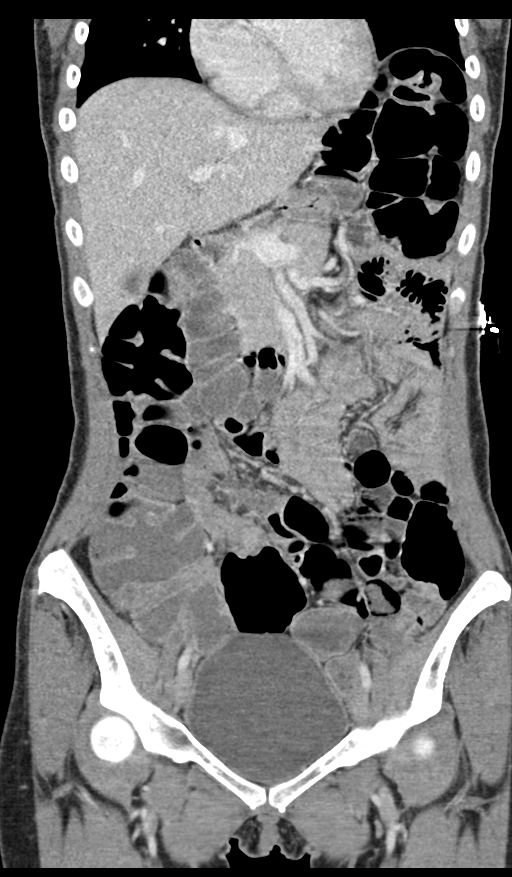
[im 46/103  soft-tissue]
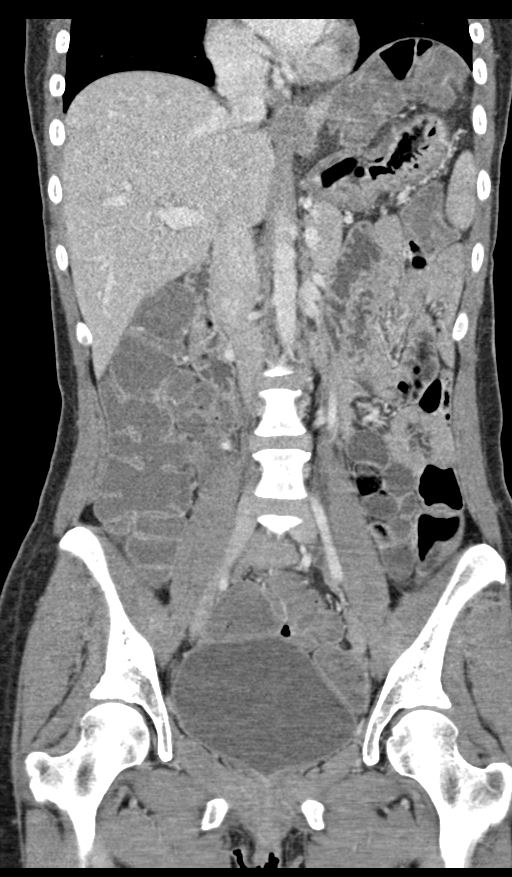
[im 57/103  soft-tissue]
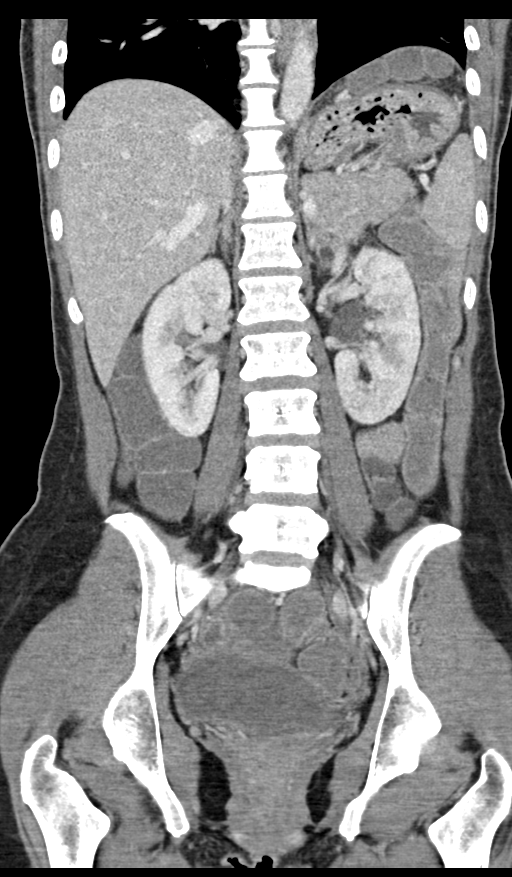

[15 of 46 positions shown; findings below may reference images not displayed]

RADIATION DOSE REDUCTION: This exam was performed according to the
departmental dose-optimization program which includes automated
exposure control, adjustment of the mA and/or kV according to
patient size and/or use of iterative reconstruction technique.

CONTRAST:  80mL OMNIPAQUE IOHEXOL 300 MG/ML  SOLN
FINDINGS: Lower chest: Unremarkable

Hepatobiliary: Unremarkable

Pancreas: Unremarkable

Spleen: Unremarkable

Adrenals/Urinary Tract: New abnormal hypoenhancing 2.1 by 1.6 by
cm segment of the left kidney upper pole, image 69 series 5. No
cortical rim sign appreciable on CT. Additional smaller cortical
areas of striated hypodensity posteriorly in the right kidney upper
pole on image 31 series 2. The right kidney appears normal. No
hydronephrosis or hydroureter. Urinary bladder unremarkable. Adrenal
glands appear normal.

Stomach/Bowel: Air-fluid levels are present in the distal colon
favoring diarrheal process. Mild gaseous prominence of the colon. No
pneumatosis or portal venous gas. No substantially dilated small
bowel. Appendix poorly seen.

Vascular/Lymphatic: Unremarkable

Reproductive: Uterus absent.  Adnexa unremarkable.

Other: No supplemental non-categorized findings.

Musculoskeletal: Unremarkable
IMPRESSION: 1. New abnormal hypoenhancing regions in the left kidney upper pole.
Given the clinical context, the appearance favors acute left renal
pyelonephritis.
2. Air fluid levels in the distal colon compatible with diarrheal
process. Mild gaseous prominence of the colon.

## 2022-08-29 ENCOUNTER — Ambulatory Visit (INDEPENDENT_AMBULATORY_CARE_PROVIDER_SITE_OTHER): Payer: Medicaid Other | Admitting: Clinical

## 2022-08-29 ENCOUNTER — Ambulatory Visit (HOSPITAL_BASED_OUTPATIENT_CLINIC_OR_DEPARTMENT_OTHER): Payer: Medicaid Other | Admitting: Physical Therapy

## 2022-08-29 ENCOUNTER — Encounter (HOSPITAL_BASED_OUTPATIENT_CLINIC_OR_DEPARTMENT_OTHER): Payer: Self-pay | Admitting: Physical Therapy

## 2022-08-29 DIAGNOSIS — F4310 Post-traumatic stress disorder, unspecified: Secondary | ICD-10-CM

## 2022-08-29 DIAGNOSIS — F909 Attention-deficit hyperactivity disorder, unspecified type: Secondary | ICD-10-CM

## 2022-08-29 DIAGNOSIS — M6281 Muscle weakness (generalized): Secondary | ICD-10-CM

## 2022-08-29 DIAGNOSIS — R279 Unspecified lack of coordination: Secondary | ICD-10-CM

## 2022-08-29 DIAGNOSIS — Z658 Other specified problems related to psychosocial circumstances: Secondary | ICD-10-CM

## 2022-08-29 DIAGNOSIS — M791 Myalgia, unspecified site: Secondary | ICD-10-CM

## 2022-08-29 NOTE — Therapy (Signed)
OUTPATIENT PHYSICAL THERAPY TREATMENT NOTE  Recert    Patient Name: Rachel [name] MRN: [id number] DOB:1986/10/07, 36 y.o., female Today's Date: 08/29/2022   END OF SESSION:   PT End of Session - 08/29/22 1709     Visit Number 17    Number of Visits 20    Date for PT Re-Evaluation 09/26/22    Authorization Type medicaid healthy blue    Authorization Time Period 6/28 - 8/26 6 visits [facility];10/16 - 11/14 4 visits    Progress Note Due on Visit 17    PT Start Time 1701    PT Stop Time 1745    PT Time Calculation (min) 44 min    Activity Tolerance Patient limited by fatigue;Patient tolerated treatment well    Behavior During Therapy [facility] for tasks assessed/performed                   Past Medical History:  Diagnosis Date   ADHD (attention deficit hyperactivity disorder)    Anemia    Anxiety    Asthma    Bipolar disorder ([place])    Complication of anesthesia    slow to wake up    Delayed gastric emptying 02/23/2020   Depression    DUB (dysfunctional uterine bleeding) 02/23/2020   Ehlers-Danlos syndrome    Endometriosis    Gastroparesis    GERD (gastroesophageal reflux disease)    Heart murmur    Hiatal hernia 04/06/2020   Myalgia 04/06/2020   Parasitic infection 02/23/2020   Pneumonia    Poor appetite 02/23/2020   RUQ pain 02/23/2020   Vitamin D deficiency    Weight loss 02/23/2020   Past Surgical History:  Procedure Laterality Date   barium study     COLONOSCOPY     CYSTOSCOPY N/A 07/12/2021   Procedure: CYSTOSCOPY;  Surgeon: [name], MD;  Location: [place];  Service: Gynecology;  Laterality: N/A;   ESOPHAGOGASTRODUODENOSCOPY  2011   and a ph study as well.    LAPAROSCOPY N/A 01/26/2021   Procedure: LAPAROSCOPY DIAGNOSTIC;  Surgeon: [name], MD;  Location: [place];  Service: Gynecology;  Laterality: N/A;   NASAL SEPTUM SURGERY     TONGUE FLAP     TOTAL LAPAROSCOPIC HYSTERECTOMY WITH SALPINGECTOMY Bilateral  07/12/2021   Procedure: TOTAL LAPAROSCOPIC HYSTERECTOMY WITH SALPINGECTOMY, OOPHORECTOMY;  Surgeon: [name], MD;  Location: [place];  Service: Gynecology;  Laterality: Bilateral;   UPPER GASTROINTESTINAL ENDOSCOPY     WISDOM TOOTH EXTRACTION     Patient Active Problem List   Diagnosis Date Noted   Iron deficiency anemia 01/20/2022   Acute hyponatremia 01/18/2022   Pyelonephritis 01/18/2022   Protein-calorie malnutrition, severe 01/18/2022   sepsis secondary to pyelonephritis of left kidney 01/17/2022   Sepsis ([place]) 01/17/2022   Chronic abdominal pain 01/17/2022   Dyspnea 01/17/2022   Underweight 01/17/2022   Irritable bowel syndrome (IBS) 01/17/2022   Hypokalemia 01/17/2022   Vasomotor symptoms due to menopause 08/17/2021   S/P laparoscopic hysterectomy 07/12/2021   Preoperative exam for gynecologic surgery 06/20/2021   Adverse drug reaction 06/01/2021   Draining postoperative wound 02/28/2021   Encounter for postoperative care 02/16/2021   Endometriosis determined by laparoscopy    Pelvic pain in female 10/11/2020   Presence of subdermal contraceptive implant 10/11/2020   Dysmenorrhea 10/11/2020   Dyspareunia in female 10/11/2020   NSVT (nonsustained ventricular tachycardia) ([place]) 09/03/2020   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   Palpitations 07/26/2020   Family history of connective tissue disease  07/26/2020   Myalgia 04/06/2020   Hiatal hernia 04/06/2020   Parasitic infection 02/23/2020   Weight loss 02/23/2020   Poor appetite 02/23/2020   RUQ pain 02/23/2020   Delayed gastric emptying 02/23/2020   DUB (dysfunctional uterine bleeding) 02/23/2020   Cervical radiculopathy 05/31/2018   Hyperalgesia 05/31/2018   Acid reflux 12/26/2017   ADHD (attention deficit hyperactivity disorder) 05/07/2012   Bipolar 1 disorder ([place]) 05/07/2012    RPCP: Charlott Rakes, MD   REFERRING PROVIDER: Charlott Rakes, MD   REFERRING DIAG: M79.10 (ICD-10-CM) - Myalgia    THERAPY DIAG:  Myalgia   Muscle weakness (generalized)   Difficulty in walking, not elsewhere classified   ONSET DATE: >19yr   SUBJECTIVE:                                                                                                                                                                                            SUBJECTIVE STATEMENT: "I  am hurting today pretty high."  PERTINENT HISTORY:  Irritable bowel syndrome with both constipation and diarrhea          Moderate protein-calorie malnutrition bipolar disorder, anxiety, depression, GERD, IBS with constipation, status post laparoscopic TAHBSO due to abnormal uterine bleed    currently on Premarin by GYN) She was hospitalized for pyelonephritis from 01/17/2022 through 01/21/2022. PAIN:  Are you having pain? Yes: NPRS scale: 8.5/10 general Pain location: cerv spine, left shoulder and arm, LB, hips Pain description: ache Aggravating factors: overdoing although tolerance levels vary Relieving factors: rest     PRECAUTIONS: Other[facility]mobile [facility]   WEIGHT BEARING RESTRICTIONS No   FALLS:  Has patient fallen in last 6 months? No   LIVING ENVIRONMENT: Lives with: lives with their family Lives in: House/apartment Stairs: Yes: Internal: 1 flight steps; yes     OCCUPATION: unable to work   PLOF: Independent with basic ADLs   PATIENT GOALS gain strength, decrease pain     OBJECTIVE:    COGNITION:           Overall cognitive status:  WFL although pt reports bouts of brain fog                                  SENSATION: Numbness and tingling LUE into cervical spine intermittent Occasional numbness and tingling in LE   MUSCLE LENGTH: Connective tissue disorder probable/hypermobile   POSTURE:  Very slight figure, slightly rounded shoulders       ROM throughout wfl Strength UE 3+/5 LE 3/5  05/24/22 UE 4-/5  LE 4-/5   06/22/22 UE 4-/5     LE 4-/5   Limited  to pain  08/07/22 UE 4/5     LE 4/5     FUNCTIONAL TESTS:  5 times sit to stand: 38 Timed up and go (TUG): 18 Berg Balance Scale: 21/56  05/24/22 5 x STS: 33s TUG: 15 Berg: 37/56  06/21/22 5  x STS: 39  TUG: 19 Berg: NT     08/07/22   Tug 17   Berg 43    GAIT: Distance walked: 300 Assistive device utilized: None Level of assistance: Complete Independence Comments: decreased stride length. Upon initiation of gait cadence slowed       TODAY'S TREATMENT  Pt seen for aquatic therapy today.  Treatment took place in water 3.25-4.8 ft in depth at the [facility] pool. Temp of water was 91.  Pt entered/exited the pool via stairs step through pattern independently with bilat rail.   Walking with and without support forward, back and sidestepping x4 widths each Sitting on noodle: hip hiking; ant/post pelvic tilts; Rotation. Seated on yellow noodle 4.80f arms extended for added core engagement /sitting balance Reverse plank gaining position x2 holding uncontrolled x 5 s best Plank on steps with hip extension x10 Plank on sm blue squoodle iso hold 2x 30s Modified abdominal crunch on bench x 8 Abdominal strengthening with sm blue sqoodle pull down x10  Pt requires buoyancy for support and to offload joints with strengthening exercises. Viscosity of the water is needed for resistance of strengthening; water current perturbations provides challenge to standing balance unsupported, requiring increased core activation.      PATIENT EDUCATION:  Education details: Propertires of water, benefit of aquatic therapy Person educated:  pt and mother Education method: Explanation Education comprehension: verbalized understanding     HOME EXERCISE PROGRAM: [place]ed[place] 08/07/22 FZ   ASSESSMENT:   CLINICAL IMPRESSION: Pt tolerates entire session with progressive exercises well today despite reports of high pain.  Her overall toleration to activity continues to  Improve. Goals ongoing   Patient is a 36y.o. F who was seen today for physical therapy evaluation and treatment for myalgia. She is also being seen for [facility] floor [facility] Pt has had hx of pain sx potentially due to a connective tissue disorder. She presents frail, slight frame ~ 97lbs.  Gastroparesis inhibiting improved eating patterns.  Pt not working. Good days she may be able to walk for 30 minutes, bad days in bed.  She will benefit from skilled aquatic PT to improve strength, toleration to activity and enhance motility of gut through movement using properties of water to facilitate progression towards goals decreasing external factors.     OBJECTIVE IMPAIRMENTS decreased activity tolerance, decreased balance, decreased endurance, decreased mobility, and decreased strength.    ACTIVITY LIMITATIONS cleaning, community activity, driving, meal prep, occupation, laundry, yard work, and shopping.    PERSONAL FACTORS Age, Behavior pattern, and 1-2 comorbidities: IBS, malnutrition  are also affecting patient's functional outcome.      REHAB POTENTIAL: fair   CLINICAL DECISION MAKING: Evolving/moderate complexity   EVALUATION COMPLEXITY: Moderate     GOALS: Goals reviewed with patient? Yes   SHORT TERM GOALS: Target date: 04/04/2022   Pt will tolerate >30 minute aquatic session with rest periods as needed to demonstrate improved toleration to activity Baseline:Not yet completed Goal status: Achieved   2.  Pt will be indep with initial aquatic HEP with access to a pool Baseline:  no HEP Goal status: ongoing no access to outside pool yet   3.  Pt will improve on LE strength up 1/2 to 1 full grade Baseline:3/5 Goal status: Achieved     LONG TERM GOALS: Target date: 09/26/22   Pt will improve on Berg Balance test to >or= 35/56 to demonstrate improved balance and decreased fall risk Baseline: 21/56 Goal status: Met   2.  Pt will improve on 5xSTS test to  <25s Baseline: 38 Goal status: onging   3.  Pt will gain up to 2lb to improve health by increasing appetite through increased movement Baseline: 97 lbs Goal status:Achieved   4.  Pt will be indep with final HEP with knowledge how to adjust according to her day to day ability to tolerate Baseline: no HEP Goal status: INITIAL         PLAN: PT FREQUENCY: 1x/week   PT DURATION: 2 weeks   PLANNED INTERVENTIONS: Therapeutic exercises, Therapeutic activity, Neuromuscular re-education, Balance training, Gait training, Patient/Family education, Joint mobilization, Stair training, Aquatic Therapy, and Taping.   PLAN FOR NEXT SESSION: core strength with balance challenges/ proximal strengthening.   Stanton Kidney [facility]) Merik Mignano MPT 08/29/2022, 5:39 PM

## 2022-08-31 NOTE — BH Specialist Note (Addendum)
Integrated Behavioral Health via Telemedicine Visit  08/31/2022 Rachel [name] [id number]  Number of [place] Clinician visits: Additional Visit  Session Start time: 9480   Session End time: 1655  Total time in minutes: 73   Referring Provider: Lynnda Shields, MD Patient/Family location: Home [facility] Provider location: [facility] All persons participating in visit: [name] and Rachel [name]   Types of Service: Individual psychotherapy and Telephone visit  I connected with Rachel [name] [name]  via  Telephone or Video Enabled Telemedicine Application  (Video is Tree surgeon) and verified that I am speaking with the correct person using two identifiers. Discussed confidentiality: Yes   I discussed the limitations of telemedicine and the availability of in person appointments.  Discussed there is a possibility of technology failure and discussed alternative modes of communication if that failure occurs.  I discussed that engaging in this telemedicine visit, they consent to the provision of behavioral healthcare and the services will be billed under their insurance.  Patient and/or legal guardian expressed understanding and consented to Telemedicine visit: Yes   Presenting Concerns: Patient and/or family reports the following symptoms/concerns:  Processing thoughts and feelings coming up regarding recent events/conversations and trust issues.  Duration of problem: Ongoing; Severity of problem: moderate  Patient and/or Family's Strengths/Protective Factors: Social connections, Social and Emotional competence, Concrete supports in place (healthy food, safe environments, etc.), Sense of purpose, and Physical Health (exercise, healthy diet, medication compliance, etc.)  Goals Addressed: Patient will:  Reduce symptoms of: anxiety and stress     Demonstrate ability to:  Set healthy boundaries in relationships  Progress towards Goals: Ongoing  Interventions: Interventions utilized:  Armed forces logistics/support/administrative officer and Supportive Reflection Standardized Assessments completed: Not Needed  Patient and/or Family Response: Patient agrees with treatment [name].   Assessment: Patient currently experiencing ADHD, PTSD, Psychosocial stress.   Patient may benefit from continued therapeutic interventions.  [name]: Follow up with behavioral health clinician on : Two weeks Behavioral recommendations:  -Continue using daily self-coping strategies as needed -Assume the best of intentions in others (as part of your own self-care) -Consider setting aside specific time weekly with partner to discuss issues that come up throughout the week. Be open to one another's perspective.  -Enjoy movie night tonight with partner, with minimal stress, no attempts at deep conversations, just enjoy. Let everything else go.  Referral(s): [place] (In Clinic)  I discussed the assessment and treatment [name] with the patient and/or parent/guardian. They were provided an opportunity to ask questions and all were answered. They agreed with the [name] and demonstrated an understanding of the instructions.   They were advised to call back or seek an in-person evaluation if the symptoms worsen or if the condition fails to improve as anticipated.  [place], LCSW     08/02/2022    2:20 PM 08/02/2022    2:11 PM 05/02/2022    2:26 PM 03/06/2022    9:40 AM 03/06/2022    9:39 AM  Depression screen PHQ 2/9  Decreased Interest 0 0 0 0 0  Down, Depressed, Hopeless 0 0 0 0 0  PHQ - 2 Score 0 0 0 0 0  Altered sleeping 2  2    Tired, decreased energy 2  1    Change in appetite 1  2    Feeling bad or failure about yourself  0  0    Trouble concentrating  1  1    Moving slowly or fidgety/restless 0  0    Suicidal thoughts 0  0    PHQ-9 Score 6  6         08/02/2022    2:20 PM 05/02/2022    2:26 PM 01/30/2022    9:56 AM 10/19/2021   11:25 AM  GAD 7 : Generalized Anxiety Score  Nervous, Anxious, on Edge 0 0 0 1  Control/stop worrying 0 0 0 0  Worry too much - different things 0 0 0 1  Trouble relaxing '1 2 1 1  '$ Restless 0 0 0 0  Easily annoyed or irritable 0 1 0 1  Afraid - awful might happen 0 0 0 1  Total GAD 7 Score '1 3 1 '$ 5

## 2022-09-05 NOTE — Therapy (Signed)
OUTPATIENT PHYSICAL THERAPY TREATMENT NOTE  Recert    Patient Name: Rachel [name] MRN: [id number] DOB:02-11-1986, 36 y.o., female Today's Date: 09/05/2022   END OF SESSION:           Past Medical History:  Diagnosis Date   ADHD (attention deficit hyperactivity disorder)    Anemia    Anxiety    Asthma    Bipolar disorder ([place])    Complication of anesthesia    slow to wake up    Delayed gastric emptying 02/23/2020   Depression    DUB (dysfunctional uterine bleeding) 02/23/2020   Ehlers-Danlos syndrome    Endometriosis    Gastroparesis    GERD (gastroesophageal reflux disease)    Heart murmur    Hiatal hernia 04/06/2020   Myalgia 04/06/2020   Parasitic infection 02/23/2020   Pneumonia    Poor appetite 02/23/2020   RUQ pain 02/23/2020   Vitamin D deficiency    Weight loss 02/23/2020   Past Surgical History:  Procedure Laterality Date   barium study     COLONOSCOPY     CYSTOSCOPY N/A 07/12/2021   Procedure: CYSTOSCOPY;  Surgeon: [name], MD;  Location: [place];  Service: Gynecology;  Laterality: N/A;   ESOPHAGOGASTRODUODENOSCOPY  2011   and a ph study as well.    LAPAROSCOPY N/A 01/26/2021   Procedure: LAPAROSCOPY DIAGNOSTIC;  Surgeon: [name], MD;  Location: [place];  Service: Gynecology;  Laterality: N/A;   NASAL SEPTUM SURGERY     TONGUE FLAP     TOTAL LAPAROSCOPIC HYSTERECTOMY WITH SALPINGECTOMY Bilateral 07/12/2021   Procedure: TOTAL LAPAROSCOPIC HYSTERECTOMY WITH SALPINGECTOMY, OOPHORECTOMY;  Surgeon: [name], MD;  Location: [place];  Service: Gynecology;  Laterality: Bilateral;   UPPER GASTROINTESTINAL ENDOSCOPY     WISDOM TOOTH EXTRACTION     Patient Active Problem List   Diagnosis Date Noted   Iron deficiency anemia 01/20/2022   Acute hyponatremia 01/18/2022   Pyelonephritis 01/18/2022   Protein-calorie malnutrition, severe 01/18/2022   sepsis secondary to pyelonephritis of left kidney 01/17/2022    Sepsis ([place]) 01/17/2022   Chronic abdominal pain 01/17/2022   Dyspnea 01/17/2022   Underweight 01/17/2022   Irritable bowel syndrome (IBS) 01/17/2022   Hypokalemia 01/17/2022   Vasomotor symptoms due to menopause 08/17/2021   S/P laparoscopic hysterectomy 07/12/2021   Preoperative exam for gynecologic surgery 06/20/2021   Adverse drug reaction 06/01/2021   Draining postoperative wound 02/28/2021   Encounter for postoperative care 02/16/2021   Endometriosis determined by laparoscopy    Pelvic pain in female 10/11/2020   Presence of subdermal contraceptive implant 10/11/2020   Dysmenorrhea 10/11/2020   Dyspareunia in female 10/11/2020   NSVT (nonsustained ventricular tachycardia) ([place]) 09/03/2020   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   Palpitations 07/26/2020   Family history of connective tissue disease 07/26/2020   Myalgia 04/06/2020   Hiatal hernia 04/06/2020   Parasitic infection 02/23/2020   Weight loss 02/23/2020   Poor appetite 02/23/2020   RUQ pain 02/23/2020   Delayed gastric emptying 02/23/2020   DUB (dysfunctional uterine bleeding) 02/23/2020   Cervical radiculopathy 05/31/2018   Hyperalgesia 05/31/2018   Acid reflux 12/26/2017   ADHD (attention deficit hyperactivity disorder) 05/07/2012   Bipolar 1 disorder ([place]) 05/07/2012    RPCP: Charlott Rakes, MD   REFERRING PROVIDER: Charlott Rakes, MD   REFERRING DIAG: M79.10 (ICD-10-CM) - Myalgia   THERAPY DIAG:  Myalgia   Muscle weakness (generalized)   Difficulty in walking, not elsewhere classified   ONSET DATE: >  86yr   SUBJECTIVE:                                                                                                                                                                                            SUBJECTIVE STATEMENT: "I  am hurting today pretty high."  PERTINENT HISTORY:  Irritable bowel syndrome with both constipation and diarrhea          Moderate protein-calorie  malnutrition bipolar disorder, anxiety, depression, GERD, IBS with constipation, status post laparoscopic TAHBSO due to abnormal uterine bleed    currently on Premarin by GYN) She was hospitalized for pyelonephritis from 01/17/2022 through 01/21/2022. PAIN:  Are you having pain? Yes: NPRS scale: 8.5/10 general Pain location: cerv spine, left shoulder and arm, LB, hips Pain description: ache Aggravating factors: overdoing although tolerance levels vary Relieving factors: rest     PRECAUTIONS: Other: being tested for [facility]/hypermobile joints   WEIGHT BEARING RESTRICTIONS No   FALLS:  Has patient fallen in last 6 months? No   LIVING ENVIRONMENT: Lives with: lives with their family Lives in: House/apartment Stairs: Yes: Internal: 1 flight steps; yes     OCCUPATION: unable to work   PLOF: Independent with basic ADLs   PATIENT GOALS gain strength, decrease pain     OBJECTIVE:    COGNITION:           Overall cognitive status:  WFL although pt reports bouts of brain fog                                  SENSATION: Numbness and tingling LUE into cervical spine intermittent Occasional numbness and tingling in LE   MUSCLE LENGTH: Connective tissue disorder probable/hypermobile   POSTURE:  Very slight figure, slightly rounded shoulders       ROM throughout wfl Strength UE 3+/5 LE 3/5  05/24/22 UE 4-/5              LE 4-/5   06/22/22 UE 4-/5     LE 4-/5   Limited to pain  08/07/22 UE 4/5     LE 4/5     FUNCTIONAL TESTS:  5 times sit to stand: 38 Timed up and go (TUG): 18 Berg Balance Scale: 21/56  05/24/22 5 x STS: 33s TUG: 15 Berg: 37/56  06/21/22 5  x STS: 39  TUG: 19 Berg: NT     08/07/22   Tug 17   Berg 43    GAIT: Distance walked: 300 Assistive device utilized: None Level of assistance: Complete Independence Comments: decreased stride length.  Upon initiation of gait cadence slowed       TODAY'S TREATMENT  Pt seen for aquatic therapy  today.  Treatment took place in water 3.25-4.8 ft in depth at the [facility] pool. Temp of water was 91.  Pt entered/exited the pool via stairs step through pattern independently with bilat rail.   Walking with and without support forward, back and sidestepping x4 widths each Sitting on noodle: hip hiking; ant/post pelvic tilts; Rotation. Seated on yellow noodle 4.54f arms extended for added core engagement /sitting balance Reverse plank gaining position x2 holding uncontrolled x 5 s best Plank on steps with hip extension x10 Plank on sm blue squoodle iso hold 2x 30s Modified abdominal crunch on bench x 8 Abdominal strengthening with sm blue sqoodle pull down x10  Pt requires buoyancy for support and to offload joints with strengthening exercises. Viscosity of the water is needed for resistance of strengthening; water current perturbations provides challenge to standing balance unsupported, requiring increased core activation.      PATIENT EDUCATION:  Education details: Propertires of water, benefit of aquatic therapy Person educated:  pt and mother Education method: Explanation Education comprehension: verbalized understanding     HOME EXERCISE PROGRAM: [place](adapt[place]) 08/07/22 FZ   ASSESSMENT:   CLINICAL IMPRESSION: Pt tolerates entire session with progressive exercises well today despite reports of high pain.  Her overall toleration to activity continues to Improve. Goals ongoing   Patient is a 36y.o. F who was seen today for physical therapy evaluation and treatment for myalgia. She is also being [facility] [place] [facility] [place] [facility] Pt has had hx of pain sx potentially due to a connective tissue disorder. She presents frail, slight frame ~ 97lbs.  Gastroparesis inhibiting improved eating patterns.  Pt not working. Good days she may be able to walk for 30 minutes, bad days in bed.  She will benefit from skilled aquatic PT to improve strength,  toleration to activity and enhance motility of gut through movement using properties of water to facilitate progression towards goals decreasing external factors.     OBJECTIVE IMPAIRMENTS decreased activity tolerance, decreased balance, decreased endurance, decreased mobility, and decreased strength.    ACTIVITY LIMITATIONS cleaning, community activity, driving, meal prep, occupation, laundry, yard work, and shopping.    PERSONAL FACTORS Age, Behavior pattern, and 1-2 comorbidities: IBS, malnutrition  are also affecting patient's functional outcome.      REHAB POTENTIAL: fair   CLINICAL DECISION MAKING: Evolving/moderate complexity   EVALUATION COMPLEXITY: Moderate     GOALS: Goals reviewed with patient? Yes   SHORT TERM GOALS: Target date: 04/04/2022   Pt will tolerate >30 minute aquatic session with rest periods as needed to demonstrate improved toleration to activity Baseline:Not yet completed Goal status: Achieved   2.  Pt will be indep with initial aquatic HEP with access to a pool Baseline: no HEP Goal status: ongoing no access to outside pool yet   3.  Pt will improve on LE strength up 1/2 to 1 full grade Baseline:3/5 Goal status: Achieved     LONG TERM GOALS: Target date: 09/26/22   Pt will improve on Berg Balance test to >or= 35/56 to demonstrate improved balance and decreased fall risk Baseline: 21/56 Goal status: Met   2.  Pt will improve on 5xSTS test to <25s Baseline: 38 Goal status: onging   3.  Pt will gain up to 2lb to improve health by increasing appetite through increased movement Baseline: 97 lbs Goal  status:Achieved   4.  Pt will be indep with final HEP with knowledge how to adjust according to her day to day ability to tolerate Baseline: no HEP Goal status: INITIAL         PLAN: PT FREQUENCY: 1x/week   PT DURATION: 2 weeks   PLANNED INTERVENTIONS: Therapeutic exercises, Therapeutic activity, Neuromuscular re-education, Balance  training, Gait training, Patient/Family education, Joint mobilization, Stair training, Aquatic Therapy, and Taping.   PLAN FOR NEXT SESSION: core strength with balance challenges/ proximal strengthening.   Areanna Gengler Tharon Aquas) Shalaya Swailes MPT 09/05/2022, 7:15 PM

## 2022-09-06 ENCOUNTER — Ambulatory Visit (HOSPITAL_BASED_OUTPATIENT_CLINIC_OR_DEPARTMENT_OTHER): Payer: Medicaid Other | Admitting: Physical Therapy

## 2022-09-06 ENCOUNTER — Encounter (HOSPITAL_BASED_OUTPATIENT_CLINIC_OR_DEPARTMENT_OTHER): Payer: Self-pay | Admitting: Physical Therapy

## 2022-09-06 DIAGNOSIS — R279 Unspecified lack of coordination: Secondary | ICD-10-CM

## 2022-09-06 DIAGNOSIS — M791 Myalgia, unspecified site: Secondary | ICD-10-CM

## 2022-09-06 DIAGNOSIS — M6281 Muscle weakness (generalized): Secondary | ICD-10-CM

## 2022-09-12 ENCOUNTER — Ambulatory Visit (INDEPENDENT_AMBULATORY_CARE_PROVIDER_SITE_OTHER): Payer: Medicaid Other | Admitting: Clinical

## 2022-09-12 DIAGNOSIS — F909 Attention-deficit hyperactivity disorder, unspecified type: Secondary | ICD-10-CM

## 2022-09-12 DIAGNOSIS — F4310 Post-traumatic stress disorder, unspecified: Secondary | ICD-10-CM

## 2022-09-12 DIAGNOSIS — Z658 Other specified problems related to psychosocial circumstances: Secondary | ICD-10-CM

## 2022-09-12 NOTE — Therapy (Signed)
OUTPATIENT PHYSICAL THERAPY TREATMENT NOTE     Patient Name: Rachel [name] MRN: [id number] DOB:06-Sep-1986, 36 y.o., female Today's Date: 09/13/2022   END OF SESSION:   PT End of Session - 09/13/22 1206     Visit Number 19    Number of Visits 20    Date for PT Re-Evaluation 09/26/22    Authorization Type medicaid healthy blue    Authorization Time Period 6/28 - 8/26 6 visits Aquatics;10/16 - 11/14 4 visits    Progress Note Due on Visit 17    PT Start Time 9417    PT Stop Time 1245    PT Time Calculation (min) 44 min    Activity Tolerance Patient limited by fatigue;Patient tolerated treatment well    Behavior During Therapy [facility] for tasks assessed/performed                     Past Medical History:  Diagnosis Date   ADHD (attention deficit hyperactivity disorder)    Anemia    Anxiety    Asthma    Bipolar disorder ([place])    Complication of anesthesia    slow to wake up    Delayed gastric emptying 02/23/2020   Depression    DUB (dysfunctional uterine bleeding) 02/23/2020   Ehlers-Danlos syndrome    Endometriosis    Gastroparesis    GERD (gastroesophageal reflux disease)    Heart murmur    Hiatal hernia 04/06/2020   Myalgia 04/06/2020   Parasitic infection 02/23/2020   Pneumonia    Poor appetite 02/23/2020   RUQ pain 02/23/2020   Vitamin D deficiency    Weight loss 02/23/2020   Past Surgical History:  Procedure Laterality Date   barium study     COLONOSCOPY     CYSTOSCOPY N/A 07/12/2021   Procedure: CYSTOSCOPY;  Surgeon: [name], MD;  Location: [place];  Service: Gynecology;  Laterality: N/A;   ESOPHAGOGASTRODUODENOSCOPY  2011   and a ph study as well.    LAPAROSCOPY N/A 01/26/2021   Procedure: LAPAROSCOPY DIAGNOSTIC;  Surgeon: [name], MD;  Location: [place];  Service: Gynecology;  Laterality: N/A;   NASAL SEPTUM SURGERY     TONGUE FLAP     TOTAL LAPAROSCOPIC HYSTERECTOMY WITH SALPINGECTOMY Bilateral  07/12/2021   Procedure: TOTAL LAPAROSCOPIC HYSTERECTOMY WITH SALPINGECTOMY, OOPHORECTOMY;  Surgeon: [name], MD;  Location: [place];  Service: Gynecology;  Laterality: Bilateral;   UPPER GASTROINTESTINAL ENDOSCOPY     WISDOM TOOTH EXTRACTION     Patient Active Problem List   Diagnosis Date Noted   Iron deficiency anemia 01/20/2022   Acute hyponatremia 01/18/2022   Pyelonephritis 01/18/2022   Protein-calorie malnutrition, severe 01/18/2022   sepsis secondary to pyelonephritis of left kidney 01/17/2022   Sepsis ([place]) 01/17/2022   Chronic abdominal pain 01/17/2022   Dyspnea 01/17/2022   Underweight 01/17/2022   Irritable bowel syndrome (IBS) 01/17/2022   Hypokalemia 01/17/2022   Vasomotor symptoms due to menopause 08/17/2021   S/P laparoscopic hysterectomy 07/12/2021   Preoperative exam for gynecologic surgery 06/20/2021   Adverse drug reaction 06/01/2021   Draining postoperative wound 02/28/2021   Encounter for postoperative care 02/16/2021   Endometriosis determined by laparoscopy    Pelvic pain in female 10/11/2020   Presence of subdermal contraceptive implant 10/11/2020   Dysmenorrhea 10/11/2020   Dyspareunia in female 10/11/2020   NSVT (nonsustained ventricular tachycardia) ([place]) 09/03/2020   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   Palpitations 07/26/2020   Family history of connective tissue  disease 07/26/2020   Myalgia 04/06/2020   Hiatal hernia 04/06/2020   Parasitic infection 02/23/2020   Weight loss 02/23/2020   Poor appetite 02/23/2020   RUQ pain 02/23/2020   Delayed gastric emptying 02/23/2020   DUB (dysfunctional uterine bleeding) 02/23/2020   Cervical radiculopathy 05/31/2018   Hyperalgesia 05/31/2018   Acid reflux 12/26/2017   ADHD (attention deficit hyperactivity disorder) 05/07/2012   Bipolar 1 disorder ([place]) 05/07/2012    RPCP: Charlott Rakes, MD   REFERRING PROVIDER: Charlott Rakes, MD   REFERRING DIAG: M79.10 (ICD-10-CM) - Myalgia    THERAPY DIAG:  Myalgia   Muscle weakness (generalized)   Difficulty in walking, not elsewhere classified   ONSET DATE: >73yr   SUBJECTIVE:                                                                                                                                                                                            SUBJECTIVE STATEMENT: "Changing weather really bothers me."  PERTINENT HISTORY:  Irritable bowel syndrome with both constipation and diarrhea          Moderate protein-calorie malnutrition bipolar disorder, anxiety, depression, GERD, IBS with constipation, status post laparoscopic TAHBSO due to abnormal uterine bleed    currently on Premarin by GYN) She was hospitalized for pyelonephritis from 01/17/2022 through 01/21/2022. PAIN:  Are you having pain? Yes: NPRS scale: 7/10 general Pain location: cerv spine, left shoulder and arm, LB, hips Pain description: ache Aggravating factors: overdoing although tolerance levels vary Relieving factors: rest     PRECAUTIONS: Other: being tested [facility]/hypermobile joints   WEIGHT BEARING RESTRICTIONS No   FALLS:  Has patient fallen in last 6 months? 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 Upon initiation of gait cadence slowed       TODAY'S TREATMENT  Pt seen for aquatic therapy today.  Treatment took place in water 3.25-4.8 ft in depth at the [employer] pool. Temp of water was 91.  Pt entered/exited the pool via stairs step through pattern independently with bilat rail.  Issued pt laminated HEP, reviewed as we completed: Walking with and without support forward, back and sidestepping x4 widths each Sitting on noodle: hip hiking; ant/post pelvic tilts; Rotation. Modified abdominal crunch on bench  Plank on 1 foam hand buoys Above position arm pull down Plank on steps with hip extension  Seated on yellow noodle 4.55f arms extended for added core engagement /sitting balance Cycling seated on bench; add/abd and flutter   Pt requires buoyancy for support and to offload joints with strengthening exercises. Viscosity of the water is needed for resistance of strengthening; water current perturbations provides challenge to standing balance unsupported, requiring increased core activation.      PATIENT EDUCATION:  Education details: Propertires of water, benefit of aquatic therapy Person educated:  pt and mother Education method: Explanation Education comprehension: verbalized understanding     HOME EXERCISE PROGRAM: [place]adapted[place] 08/07/22 FZ    Added 09/06/22 ASSESSMENT:   CLINICAL IMPRESSION: Pt with overall body pain due to changes in weather.  She does not tolerate full session but is able to do a few reps of all exercises  on HEP.  Program is reviewed giving vc and adding wording for pt to better understand and be indep. She is instructed to try to complete prior to next visit to be certain of her indep with.  Plan to dc next visit.   Patient is a 36y.o. F who was seen today for physical therapy evaluation and treatment for myalgia. She [place] [facility] [place] Pt has had hx of pain sx potentially due to a connective tissue disorder. She presents frail, slight frame ~ 97lbs.  Gastroparesis inhibiting improved eating patterns.  Pt not working. Good days she may be able to walk for 30 minutes, bad days in bed.  She will benefit from skilled aquatic PT to improve strength, toleration to activity and enhance motility of gut through movement using properties of water to facilitate progression towards goals decreasing external factors.     OBJECTIVE IMPAIRMENTS decreased activity tolerance, decreased balance, decreased endurance, decreased mobility, and decreased strength.    ACTIVITY LIMITATIONS cleaning, community activity, driving, meal prep, occupation, laundry, yard work, and shopping.    PERSONAL FACTORS Age, Behavior pattern, and 1-2 comorbidities: IBS, malnutrition  are also affecting patient's functional outcome.      REHAB POTENTIAL: fair   CLINICAL DECISION MAKING: Evolving/moderate complexity   EVALUATION COMPLEXITY: Moderate     GOALS: Goals reviewed with patient? Yes   SHORT TERM GOALS: Target date:  04/04/2022   Pt will tolerate >30 minute aquatic session with rest periods as needed to demonstrate improved toleration to activity Baseline:Not yet completed Goal status: Achieved   2.  Pt will be indep with initial aquatic HEP with access to a pool Baseline: no HEP Goal status: ongoing no access to outside pool yet   3.  Pt will improve on LE strength up 1/2 to 1 full grade Baseline:3/5 Goal status: Achieved     LONG TERM GOALS: Target date: 09/26/22    Pt will improve on Berg Balance test to >or= 35/56 to demonstrate improved balance and decreased fall risk Baseline: 21/56 Goal status: Met   2.  Pt will improve on 5xSTS test to <25s Baseline: 38 Goal status: onging   3.  Pt will gain up to 2lb to improve health by increasing appetite through increased movement Baseline: 97 lbs Goal status:Achieved   4.  Pt will be indep with final HEP with knowledge how to adjust according to her day to day ability to tolerate Baseline: no HEP Goal status: INITIAL         PLAN: PT FREQUENCY: 1x/week   PT DURATION: 2 weeks   PLANNED INTERVENTIONS: Therapeutic exercises, Therapeutic activity, Neuromuscular re-education, Balance training, Gait training, Patient/Family education, Joint mobilization, Stair training, Aquatic Therapy, and Taping.   PLAN FOR NEXT SESSION: core strength with balance challenges/ proximal strengthening.   Doratha Mcswain Tharon Aquas) Shylin Keizer MPT 09/13/2022, 12:45 PM

## 2022-09-13 ENCOUNTER — Ambulatory Visit (HOSPITAL_BASED_OUTPATIENT_CLINIC_OR_DEPARTMENT_OTHER): Payer: Medicaid Other | Attending: Family Medicine | Admitting: Physical Therapy

## 2022-09-13 ENCOUNTER — Encounter (HOSPITAL_BASED_OUTPATIENT_CLINIC_OR_DEPARTMENT_OTHER): Payer: Self-pay | Admitting: Physical Therapy

## 2022-09-13 DIAGNOSIS — M6281 Muscle weakness (generalized): Secondary | ICD-10-CM | POA: Insufficient documentation

## 2022-09-13 DIAGNOSIS — M791 Myalgia, unspecified site: Secondary | ICD-10-CM | POA: Insufficient documentation

## 2022-09-13 DIAGNOSIS — R279 Unspecified lack of coordination: Secondary | ICD-10-CM | POA: Diagnosis not present

## 2022-09-14 NOTE — BH Specialist Note (Addendum)
Integrated Behavioral Health via Telemedicine Visit  09/28/2022 Jyll Tomaro [id number]  Number of Integrated Behavioral Health Clinician visits: Additional Visit  Session Start time: 1047   Session End time: 1147  Total time in minutes: 60   Referring Provider: Mariel Aloe, MD Patient/Family location: Home [facility] Provider location: Center for [facility]  All persons participating in visit: Patient Fannie Gathright and [facility] Sigmund Morera   Types of Service: Individual psychotherapy and Telephone visit  I connected with [name] and/or [name] Treanor's  n/a  via  Telephone or Video Enabled Telemedicine Application  (Video is Caregility application) and verified that I am speaking with the correct person using two identifiers. Discussed confidentiality: Yes   I discussed the limitations of telemedicine and the availability of in person appointments.  Discussed there is a possibility of technology failure and discussed alternative modes of communication if that failure occurs.  I discussed that engaging in this telemedicine visit, they consent to the provision of behavioral healthcare and the services will be billed under their insurance.  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  Duration of problem: Ongoing; Severity of problem: moderate  Patient and/or Family's Strengths/Protective Factors: Social connections, Social and Emotional competence, Concrete supports in place (healthy food, safe environments, etc.), Sense of purpose, and Physical  Health (exercise, healthy diet, medication compliance, etc.)  Goals Addressed: Patient will:  Reduce symptoms of: anxiety and depression   Demonstrate ability to:  Continue healthy adjustment to ongoing health condition  Progress towards Goals: Ongoing  Interventions: Interventions utilized:  ACT (Acceptance and Commitment Therapy) Standardized Assessments completed: Not Needed  Patient and/or Family Response: Patient agrees with treatment plan.   Assessment: Patient currently experiencing PTSD, ADHD, Psychosocial stress.   Patient may benefit from continued therapeutic interventions .  Plan: Follow up with behavioral health clinician on : Two weeks Behavioral recommendations:  -Continue using daily self-coping strategies to manage emotions and physical health -Continue keeping open communication with partner; setting healthy boundaries with others -Enjoy time with family and partner over Thanksgiving week Referral(s): Integrated [employer] (In Clinic)  I discussed the assessment and treatment plan with the patient and/or parent/guardian. They were provided an opportunity to ask questions and all were answered. They agreed with the plan and demonstrated an understanding of the instructions.   They were advised to call back or seek an in-person evaluation if the symptoms worsen or if the condition fails to improve as anticipated.  [name] Matsuko Kretz, LCSW

## 2022-09-20 ENCOUNTER — Ambulatory Visit (HOSPITAL_BASED_OUTPATIENT_CLINIC_OR_DEPARTMENT_OTHER): Payer: Medicaid Other | Admitting: Physical Therapy

## 2022-09-21 ENCOUNTER — Observation Stay (HOSPITAL_COMMUNITY)
Admission: EM | Admit: 2022-09-21 | Discharge: 2022-09-23 | Disposition: A | Discharge status: Home / Self Care | Payer: Medicaid Other | Attending: Internal Medicine | Admitting: Internal Medicine

## 2022-09-21 ENCOUNTER — Emergency Department (HOSPITAL_COMMUNITY): Payer: Medicaid Other

## 2022-09-21 ENCOUNTER — Other Ambulatory Visit: Payer: Self-pay

## 2022-09-21 ENCOUNTER — Encounter (HOSPITAL_COMMUNITY): Payer: Self-pay | Admitting: Emergency Medicine

## 2022-09-21 DIAGNOSIS — E785 Hyperlipidemia, unspecified: Secondary | ICD-10-CM | POA: Insufficient documentation

## 2022-09-21 DIAGNOSIS — Z79899 Other long term (current) drug therapy: Secondary | ICD-10-CM | POA: Insufficient documentation

## 2022-09-21 DIAGNOSIS — R935 Abnormal findings on diagnostic imaging of other abdominal regions, including retroperitoneum: Secondary | ICD-10-CM | POA: Diagnosis not present

## 2022-09-21 DIAGNOSIS — R1013 Epigastric pain: Secondary | ICD-10-CM | POA: Diagnosis not present

## 2022-09-21 DIAGNOSIS — R112 Nausea with vomiting, unspecified: Secondary | ICD-10-CM | POA: Diagnosis not present

## 2022-09-21 DIAGNOSIS — J45909 Unspecified asthma, uncomplicated: Secondary | ICD-10-CM | POA: Diagnosis not present

## 2022-09-21 DIAGNOSIS — Z1152 Encounter for screening for COVID-19: Secondary | ICD-10-CM | POA: Insufficient documentation

## 2022-09-21 DIAGNOSIS — F319 Bipolar disorder, unspecified: Secondary | ICD-10-CM | POA: Diagnosis present

## 2022-09-21 DIAGNOSIS — K219 Gastro-esophageal reflux disease without esophagitis: Secondary | ICD-10-CM | POA: Diagnosis present

## 2022-09-21 DIAGNOSIS — K828 Other specified diseases of gallbladder: Secondary | ICD-10-CM | POA: Diagnosis not present

## 2022-09-21 DIAGNOSIS — R109 Unspecified abdominal pain: Secondary | ICD-10-CM | POA: Diagnosis present

## 2022-09-21 DIAGNOSIS — E876 Hypokalemia: Secondary | ICD-10-CM | POA: Diagnosis not present

## 2022-09-21 DIAGNOSIS — R1011 Right upper quadrant pain: Principal | ICD-10-CM | POA: Insufficient documentation

## 2022-09-21 LAB — COMPREHENSIVE METABOLIC PANEL
ALT: 13 U/L (ref 0–44)
AST: 17 U/L (ref 15–41)
Albumin: 4.3 g/dL (ref 3.5–5.0)
Alkaline Phosphatase: 70 U/L (ref 38–126)
Anion gap: 5 (ref 5–15)
BUN: 15 mg/dL (ref 6–20)
CO2: 23 mmol/L (ref 22–32)
Calcium: 9.4 mg/dL (ref 8.9–10.3)
Chloride: 111 mmol/L (ref 98–111)
Creatinine, Ser: 0.94 mg/dL (ref 0.44–1.00)
GFR, Estimated: >60 mL/min (ref >60)
Glucose, Bld: 115 mg/dL — ABNORMAL HIGH (ref 70–99)
Potassium: 3.5 mmol/L (ref 3.5–5.1)
Sodium: 139 mmol/L (ref 135–145)
Total Bilirubin: 0.7 mg/dL (ref 0.3–1.2)
Total Protein: 7.6 g/dL (ref 6.5–8.1)

## 2022-09-21 LAB — I-STAT BETA HCG BLOOD, ED (MC, WL, AP ONLY): I-stat hCG, quantitative: 6.3 m[IU]/mL — ABNORMAL HIGH (ref <5)

## 2022-09-21 LAB — CBC
HCT: 40 % (ref 36.0–46.0)
Hemoglobin: 13.7 g/dL (ref 12.0–15.0)
MCH: 31.6 pg (ref 26.0–34.0)
MCHC: 34.3 g/dL (ref 30.0–36.0)
MCV: 92.4 fL (ref 80.0–100.0)
Platelets: 243 10*3/uL (ref 150–400)
RBC: 4.33 MIL/uL (ref 3.87–5.11)
RDW: 12.9 % (ref 11.5–15.5)
WBC: 7.9 10*3/uL (ref 4.0–10.5)
nRBC: 0 % (ref 0.0–0.2)

## 2022-09-21 LAB — LIPASE, BLOOD: Lipase: 52 U/L — ABNORMAL HIGH (ref 11–51)

## 2022-09-21 MED ORDER — OXYCODONE-ACETAMINOPHEN 5-325 MG PO TABS
1.0000 | ORAL_TABLET | Freq: Once | ORAL | Status: AC
  Administered 2022-09-21: 1 via ORAL
  Filled 2022-09-21: qty 1

## 2022-09-21 NOTE — ED Notes (Signed)
Labeled specimen cup given to pt for U/A collection per MD order. ENMiles 

## 2022-09-21 NOTE — ED Triage Notes (Signed)
Pt RUQ pain since 2100 tonight. Pt has had N/D x 2 days.

## 2022-09-22 ENCOUNTER — Emergency Department (HOSPITAL_COMMUNITY): Payer: Medicaid Other

## 2022-09-22 ENCOUNTER — Encounter (HOSPITAL_COMMUNITY): Payer: Self-pay

## 2022-09-22 DIAGNOSIS — R1084 Generalized abdominal pain: Secondary | ICD-10-CM | POA: Diagnosis not present

## 2022-09-22 DIAGNOSIS — K3189 Other diseases of stomach and duodenum: Secondary | ICD-10-CM | POA: Diagnosis not present

## 2022-09-22 DIAGNOSIS — R109 Unspecified abdominal pain: Secondary | ICD-10-CM | POA: Diagnosis present

## 2022-09-22 DIAGNOSIS — R112 Nausea with vomiting, unspecified: Secondary | ICD-10-CM | POA: Diagnosis not present

## 2022-09-22 DIAGNOSIS — J45909 Unspecified asthma, uncomplicated: Secondary | ICD-10-CM | POA: Insufficient documentation

## 2022-09-22 DIAGNOSIS — R1013 Epigastric pain: Secondary | ICD-10-CM | POA: Diagnosis not present

## 2022-09-22 DIAGNOSIS — R1010 Upper abdominal pain, unspecified: Secondary | ICD-10-CM

## 2022-09-22 DIAGNOSIS — R935 Abnormal findings on diagnostic imaging of other abdominal regions, including retroperitoneum: Secondary | ICD-10-CM | POA: Diagnosis not present

## 2022-09-22 DIAGNOSIS — E785 Hyperlipidemia, unspecified: Secondary | ICD-10-CM | POA: Insufficient documentation

## 2022-09-22 HISTORY — DX: Nausea with vomiting, unspecified: R11.2

## 2022-09-22 LAB — COMPREHENSIVE METABOLIC PANEL
ALT: 10 U/L (ref 0–44)
AST: 14 U/L — ABNORMAL LOW (ref 15–41)
Albumin: 3.1 g/dL — ABNORMAL LOW (ref 3.5–5.0)
Alkaline Phosphatase: 47 U/L (ref 38–126)
Anion gap: 3 — ABNORMAL LOW (ref 5–15)
BUN: 10 mg/dL (ref 6–20)
CO2: 25 mmol/L (ref 22–32)
Calcium: 8.5 mg/dL — ABNORMAL LOW (ref 8.9–10.3)
Chloride: 112 mmol/L — ABNORMAL HIGH (ref 98–111)
Creatinine, Ser: 0.62 mg/dL (ref 0.44–1.00)
GFR, Estimated: >60 mL/min (ref >60)
Glucose, Bld: 84 mg/dL (ref 70–99)
Potassium: 3.1 mmol/L — ABNORMAL LOW (ref 3.5–5.1)
Sodium: 140 mmol/L (ref 135–145)
Total Bilirubin: 0.8 mg/dL (ref 0.3–1.2)
Total Protein: 5.7 g/dL — ABNORMAL LOW (ref 6.5–8.1)

## 2022-09-22 LAB — URINALYSIS, ROUTINE W REFLEX MICROSCOPIC
Bilirubin Urine: NEGATIVE
Glucose, UA: NEGATIVE mg/dL
Hgb urine dipstick: NEGATIVE
Ketones, ur: 5 mg/dL — AB
Leukocytes,Ua: NEGATIVE
Nitrite: NEGATIVE
Protein, ur: NEGATIVE mg/dL
Specific Gravity, Urine: 1.038 — ABNORMAL HIGH (ref 1.005–1.030)
pH: 5 (ref 5.0–8.0)

## 2022-09-22 LAB — CBC WITH DIFFERENTIAL/PLATELET
Abs Immature Granulocytes: 0.01 10*3/uL (ref 0.00–0.07)
Basophils Absolute: 0.1 10*3/uL (ref 0.0–0.1)
Basophils Relative: 1 %
Eosinophils Absolute: 0.5 10*3/uL (ref 0.0–0.5)
Eosinophils Relative: 8 %
HCT: 32.8 % — ABNORMAL LOW (ref 36.0–46.0)
Hemoglobin: 11.1 g/dL — ABNORMAL LOW (ref 12.0–15.0)
Immature Granulocytes: 0 %
Lymphocytes Relative: 52 %
Lymphs Abs: 3 10*3/uL (ref 0.7–4.0)
MCH: 31.8 pg (ref 26.0–34.0)
MCHC: 33.8 g/dL (ref 30.0–36.0)
MCV: 94 fL (ref 80.0–100.0)
Monocytes Absolute: 0.4 10*3/uL (ref 0.1–1.0)
Monocytes Relative: 7 %
Neutro Abs: 1.9 10*3/uL (ref 1.7–7.7)
Neutrophils Relative %: 32 %
Platelets: 175 10*3/uL (ref 150–400)
RBC: 3.49 MIL/uL — ABNORMAL LOW (ref 3.87–5.11)
RDW: 13.1 % (ref 11.5–15.5)
WBC: 5.9 10*3/uL (ref 4.0–10.5)
nRBC: 0 % (ref 0.0–0.2)

## 2022-09-22 LAB — RESP PANEL BY RT-PCR (FLU A&B, COVID) ARPGX2
Influenza A by PCR: NEGATIVE
Influenza B by PCR: NEGATIVE
SARS Coronavirus 2 by RT PCR: NEGATIVE

## 2022-09-22 LAB — LACTIC ACID, PLASMA: Lactic Acid, Venous: 0.7 mmol/L (ref 0.5–1.9)

## 2022-09-22 MED ORDER — PANTOPRAZOLE SODIUM 40 MG IV SOLR
40.0000 mg | Freq: Two times a day (BID) | INTRAVENOUS | Status: DC
  Administered 2022-09-22 – 2022-09-23 (×3): 40 mg via INTRAVENOUS
  Filled 2022-09-22 (×3): qty 10

## 2022-09-22 MED ORDER — IOHEXOL 300 MG/ML  SOLN
150.0000 mL | Freq: Once | INTRAMUSCULAR | Status: AC | PRN
  Administered 2022-09-22: 50 mL via ORAL

## 2022-09-22 MED ORDER — DIPHENHYDRAMINE HCL 50 MG/ML IJ SOLN
25.0000 mg | Freq: Once | INTRAMUSCULAR | Status: AC
  Administered 2022-09-22: 25 mg via INTRAVENOUS
  Filled 2022-09-22: qty 1

## 2022-09-22 MED ORDER — FENTANYL CITRATE PF 50 MCG/ML IJ SOSY
50.0000 ug | PREFILLED_SYRINGE | INTRAMUSCULAR | Status: DC | PRN
  Administered 2022-09-23: 50 ug via INTRAVENOUS
  Filled 2022-09-22: qty 1

## 2022-09-22 MED ORDER — LACTATED RINGERS IV BOLUS
1000.0000 mL | Freq: Once | INTRAVENOUS | Status: AC
  Administered 2022-09-22: 1000 mL via INTRAVENOUS

## 2022-09-22 MED ORDER — PROCHLORPERAZINE EDISYLATE 10 MG/2ML IJ SOLN
10.0000 mg | Freq: Four times a day (QID) | INTRAMUSCULAR | Status: DC | PRN
  Administered 2022-09-22: 10 mg via INTRAVENOUS
  Filled 2022-09-22 (×2): qty 2

## 2022-09-22 MED ORDER — SODIUM CHLORIDE (PF) 0.9 % IJ SOLN
INTRAMUSCULAR | Status: AC
  Filled 2022-09-22: qty 50

## 2022-09-22 MED ORDER — KETOROLAC TROMETHAMINE 15 MG/ML IJ SOLN
15.0000 mg | Freq: Once | INTRAMUSCULAR | Status: AC
  Administered 2022-09-22: 15 mg via INTRAVENOUS
  Filled 2022-09-22: qty 1

## 2022-09-22 MED ORDER — ONDANSETRON HCL 4 MG/2ML IJ SOLN
4.0000 mg | Freq: Once | INTRAMUSCULAR | Status: AC
  Administered 2022-09-22: 4 mg via INTRAVENOUS
  Filled 2022-09-22: qty 2

## 2022-09-22 MED ORDER — SODIUM CHLORIDE 0.9 % IV SOLN: INTRAVENOUS | Status: DC

## 2022-09-22 MED ORDER — SODIUM CHLORIDE 0.9 % IV SOLN
12.5000 mg | Freq: Once | INTRAVENOUS | Status: AC
  Administered 2022-09-22: 12.5 mg via INTRAVENOUS
  Filled 2022-09-22: qty 12.5

## 2022-09-22 MED ORDER — IOHEXOL 300 MG/ML  SOLN
100.0000 mL | Freq: Once | INTRAMUSCULAR | Status: AC | PRN
  Administered 2022-09-22: 100 mL via INTRAVENOUS

## 2022-09-22 MED ORDER — FENTANYL CITRATE PF 50 MCG/ML IJ SOSY
50.0000 ug | PREFILLED_SYRINGE | Freq: Once | INTRAMUSCULAR | Status: AC
  Administered 2022-09-22: 50 ug via INTRAVENOUS
  Filled 2022-09-22: qty 1

## 2022-09-22 MED ORDER — LORAZEPAM 2 MG/ML IJ SOLN
0.5000 mg | Freq: Once | INTRAMUSCULAR | Status: AC
  Administered 2022-09-22: 0.5 mg via INTRAVENOUS
  Filled 2022-09-22: qty 1

## 2022-09-22 MED ORDER — HYDROMORPHONE HCL 1 MG/ML IJ SOLN
1.0000 mg | Freq: Once | INTRAMUSCULAR | Status: AC | PRN
  Administered 2022-09-22: 1 mg via INTRAVENOUS
  Filled 2022-09-22: qty 1

## 2022-09-22 MED ORDER — MORPHINE SULFATE (PF) 4 MG/ML IV SOLN
4.0000 mg | Freq: Once | INTRAVENOUS | Status: AC
  Administered 2022-09-22: 4 mg via INTRAVENOUS
  Filled 2022-09-22: qty 1

## 2022-09-22 NOTE — ED Provider Notes (Signed)
Clinical Course as of 09/22/22 1048  Fri Sep 22, 2022  0919 Spoke to [place] PA they will discuss with attending and let me know recommendations [MT]  0940 I spoke to Dr Maricela Curet (sp?) from radiology who reviewed the upper GI images and does not feel this is indicative of a volvulus.  Therefore the patient will be admitted to medicine for pain control and Gi consultation, but there is no emergent indication for surgery at this time. [MT]  1836 Pt reasseessed and still in pain, vitals stable - will order pain medication, admit to medical service at this time. [MT]    Clinical Course User Index [MT] Wyvonnia Dusky, MD   1045 AM admitted to hospitalist   Wyvonnia Dusky, MD 09/22/22 1048

## 2022-09-22 NOTE — ED Notes (Signed)
Pt is requesting IV fluids MD aware

## 2022-09-22 NOTE — ED Notes (Signed)
MD aware of pain level.

## 2022-09-22 NOTE — Consult Note (Signed)
Referring Provider: Dr. Langston Masker, EDP Primary Care Physician:  Charlott Rakes, MD Primary Gastroenterologist:  Dr. Silverio Decamp  Reason for Consultation: Gastric volvulus  HPI: Rachel [name] is a 36 y.o. female who has past medical history of bipolar disorder, ADHD, chronic constipation, possibly some delayed gastric emptying/gastroparesis, endometriosis status post TAH-BSO, and Ehlers-Danlos.  Patient follows in our office for her chronic constipation and suspected delayed gastric emptying issues.  She presented to [facility] long ED with complaints of 48 hours of progressively worsening upper abdominal pain and nausea.  When I saw the patient she had received some Ativan and was sleepy and did not provide any history.  Her father was at bedside.  She has been having similar episodes to this intermittently for years.  Laboratory evaluation here was normal.  CT scan of the abdomen and pelvis with contrast:  IMPRESSION: Possible gastric volvulus. Upper GI may be beneficial for further evaluation if clinically warranted. No evidence of ischemia.   Wall thickening about the stomach may be due to underdistention or gastritis.   Otherwise no acute abnormality in the abdomen or pelvis.  UGI series:  IMPRESSION: 1. No gastric volvulus or obstruction. 2. Atypical orientation of the stomach related to elevation of the LEFT hemidiaphragm. 3. Normal duodenum. 4. No hiatal hernia.  EGD 03/2020: - LA Grade A reflux esophagitis with no bleeding. - Large hiatal hernia - Normal examined duodenum. - No specimens collected. - Partial gastric volvulus was present, most of the stomach appears to have herniated up, large hiatal hernia with hiatal narrowing location in the gastric antrum. Otherwise gastric mucosa appeared normal.  Colonoscopy 03/2020: This showed only melanosis in the colon and nonbleeding internal hemorrhoids.  Past Medical History:  Diagnosis Date   ADHD (attention deficit  hyperactivity disorder)    Anemia    Anxiety    Asthma    Bipolar disorder ([place])    Complication of anesthesia    slow to wake up    Delayed gastric emptying 02/23/2020   Depression    DUB (dysfunctional uterine bleeding) 02/23/2020   Ehlers-Danlos syndrome    Endometriosis    Gastroparesis    GERD (gastroesophageal reflux disease)    Heart murmur    Hiatal hernia 04/06/2020   Myalgia 04/06/2020   Parasitic infection 02/23/2020   Pneumonia    Poor appetite 02/23/2020   RUQ pain 02/23/2020   Vitamin D deficiency    Weight loss 02/23/2020    Past Surgical History:  Procedure Laterality Date   barium study     COLONOSCOPY     CYSTOSCOPY N/A 07/12/2021   Procedure: CYSTOSCOPY;  Surgeon: [name], MD;  Location: [place];  Service: Gynecology;  Laterality: N/A;   ESOPHAGOGASTRODUODENOSCOPY  2011   and a ph study as well.    LAPAROSCOPY N/A 01/26/2021   Procedure: LAPAROSCOPY DIAGNOSTIC;  Surgeon: [name], MD;  Location: [place];  Service: Gynecology;  Laterality: N/A;   NASAL SEPTUM SURGERY     TONGUE FLAP     TOTAL LAPAROSCOPIC HYSTERECTOMY WITH SALPINGECTOMY Bilateral 07/12/2021   Procedure: TOTAL LAPAROSCOPIC HYSTERECTOMY WITH SALPINGECTOMY, OOPHORECTOMY;  Surgeon: [name], MD;  Location: [facility];  Service: Gynecology;  Laterality: Bilateral;   UPPER GASTROINTESTINAL ENDOSCOPY     WISDOM TOOTH EXTRACTION      Prior to Admission medications   Medication Sig Start Date End Date Taking? Authorizing Provider  albuterol (PROVENTIL) (2.5 MG/3ML) 0.083% nebulizer solution Take 3 mLs (2.5 mg total) by  nebulization every 6 (six) hours as needed for wheezing or shortness of breath. Max 4 doses per day 07/26/22   Petrucelli, Samantha R, PA-C  albuterol (VENTOLIN HFA) 108 (90 Base) MCG/ACT inhaler Inhale 1-2 puffs into the lungs every 6 (six) hours as needed for wheezing or shortness of breath. 12/16/21   Kandra Nicolas, MD  Ascorbic Acid  (VITAMIN C) 1000 MG tablet Take 1,000 mg by mouth 2 (two) times daily.    [provider]  atorvastatin (LIPITOR) 20 MG tablet Take 1 tablet (20 mg total) by mouth daily. 08/02/22   Charlott Rakes, MD  azithromycin (ZITHROMAX) 250 MG tablet Take 1 tablet (250 mg total) by mouth daily. Take first 2 tablets together, then 1 every day until finished. Patient not taking: Reported on 08/02/2022 07/26/22   Petrucelli, Aldona Bar R, PA-C  B Complex Vitamins (B COMPLEX PO) Take 1 capsule by mouth daily.    [provider]  Calcium-Magnesium-Vitamin D 600-40-500 MG-MG-UNIT TB24 Take 3 tablets by mouth in the morning and at bedtime.    [provider]  cefpodoxime (VANTIN) 200 MG tablet Take 1 tablet (200 mg total) by mouth 2 (two) times daily. Patient not taking: Reported on 08/02/2022 07/26/22   Petrucelli, Glynda Jaeger, PA-C  Cholecalciferol (VITAMIN D3) 50 MCG (2000 UT) TABS Take 2,000 Units by mouth daily.    [provider]  ELDERBERRY PO Take 1 tablet by mouth daily.    [provider]  EPINEPHrine 0.3 mg/0.3 mL IJ SOAJ injection Inject 0.3 mg into the muscle as needed for anaphylaxis. 05/27/21   Levin Bacon, MD  estrogens, conjugated, (PREMARIN) 0.45 MG tablet Take 1 tablet (0.45 mg total) by mouth daily. 08/17/21   [name], MD  estrogens, conjugated, (PREMARIN) 0.625 MG tablet Take 1 tablet (0.625 mg total) by mouth daily. Take daily for 21 days then do not take for 7 days. 03/24/22 03/24/23  [name], MD  Ferrous Sulfate (IRON PO) Take 1 capsule by mouth daily.    [provider]  fexofenadine (ALLEGRA) 180 MG tablet Take 180 mg by mouth daily.    [provider]  ibuprofen (ADVIL) 600 MG tablet Take 1 tablet (600 mg total) by mouth every 6 (six) hours as needed for headache, mild pain, moderate pain or cramping. 07/13/21   [name], MD  Iron, Ferrous Sulfate, 325 (65 Fe) MG TABS Take 325 mg by mouth daily. 08/02/22    Charlott Rakes, MD  LINZESS 290 MCG CAPS capsule TAKE ONE CAPSULE BY MOUTH EVERY MORNING BEFORE BREAKFAST 07/25/22   Mauri Pole, MD  Melatonin 10 MG TABS Take 10 mg by mouth at bedtime.    [provider]  MIRALAX 17 GM/SCOOP powder Take 17 g by mouth See admin instructions. Mix 17 g of powder into water and drink once a day    [provider]  Multiple Vitamins-Minerals (ADULT ONE DAILY GUMMIES PO) Take 2 tablets by mouth daily.    [provider]  Multiple Vitamins-Minerals (ZINC PO) Take 1 capsule by mouth daily.    [provider]  Omega-3 Fatty Acids (FISH OIL) 1200 MG CAPS Take 1,200 mg by mouth daily.    [provider]  potassium chloride SA (KLOR-CON M) 20 MEQ tablet Take 1 tablet (20 mEq total) by mouth 2 (two) times daily. Patient not taking: Reported on 08/02/2022 07/26/22   Petrucelli, Glynda Jaeger, PA-C  predniSONE (DELTASONE) 50 MG tablet Take 1 tablet (50 mg  total) by mouth daily with breakfast. Patient not taking: Reported on 08/02/2022 07/26/22   Petrucelli, Glynda Jaeger, PA-C  promethazine (PHENERGAN) 12.5 MG tablet Take 12.5 mg by mouth See admin instructions. Take 12.5 mg by mouth every 6-8 hours as needed for nausea    [provider]  sennosides-docusate sodium (SENOKOT-S) 8.6-50 MG tablet Take 1-2 tablets by mouth daily.    [provider]  triamcinolone cream (KENALOG) 0.1 % APPLY TO AFFECTED AREA(S) TWO TIMES A DAY 08/22/22   Charlott Rakes, MD  VITAMIN A PO Take 1 capsule by mouth daily.    [provider]    Current Facility-Administered Medications  Medication Dose Route Frequency Provider Last Rate Last Admin   0.9 %  sodium chloride infusion   Intravenous Continuous Wyvonnia Dusky, MD 90 mL/hr at 09/22/22 1013 New Bag at 09/22/22 1013   HYDROmorphone (DILAUDID) injection 1 mg  1 mg Intravenous Once PRN Trifan, Carola Rhine, MD       sodium chloride (PF) 0.9 % injection            Current  Outpatient Medications  Medication Sig Dispense Refill   albuterol (PROVENTIL) (2.5 MG/3ML) 0.083% nebulizer solution Take 3 mLs (2.5 mg total) by nebulization every 6 (six) hours as needed for wheezing or shortness of breath. Max 4 doses per day 75 mL 0   albuterol (VENTOLIN HFA) 108 (90 Base) MCG/ACT inhaler Inhale 1-2 puffs into the lungs every 6 (six) hours as needed for wheezing or shortness of breath. 8 g 1   Ascorbic Acid (VITAMIN C) 1000 MG tablet Take 1,000 mg by mouth 2 (two) times daily.     atorvastatin (LIPITOR) 20 MG tablet Take 1 tablet (20 mg total) by mouth daily. 30 tablet 3   azithromycin (ZITHROMAX) 250 MG tablet Take 1 tablet (250 mg total) by mouth daily. Take first 2 tablets together, then 1 every day until finished. (Patient not taking: Reported on 08/02/2022) 6 tablet 0   B Complex Vitamins (B COMPLEX PO) Take 1 capsule by mouth daily.     Calcium-Magnesium-Vitamin D 600-40-500 MG-MG-UNIT TB24 Take 3 tablets by mouth in the morning and at bedtime.     cefpodoxime (VANTIN) 200 MG tablet Take 1 tablet (200 mg total) by mouth 2 (two) times daily. (Patient not taking: Reported on 08/02/2022) 14 tablet 0   Cholecalciferol (VITAMIN D3) 50 MCG (2000 UT) TABS Take 2,000 Units by mouth daily.     ELDERBERRY PO Take 1 tablet by mouth daily.     EPINEPHrine 0.3 mg/0.3 mL IJ SOAJ injection Inject 0.3 mg into the muscle as needed for anaphylaxis. 2 each 0   estrogens, conjugated, (PREMARIN) 0.45 MG tablet Take 1 tablet (0.45 mg total) by mouth daily. 30 tablet 6   estrogens, conjugated, (PREMARIN) 0.625 MG tablet Take 1 tablet (0.625 mg total) by mouth daily. Take daily for 21 days then do not take for 7 days. 30 tablet 11   Ferrous Sulfate (IRON PO) Take 1 capsule by mouth daily.     fexofenadine (ALLEGRA) 180 MG tablet Take 180 mg by mouth daily.     ibuprofen (ADVIL) 600 MG tablet Take 1 tablet (600 mg total) by mouth every 6 (six) hours as needed for headache, mild pain, moderate pain  or cramping. 30 tablet 2   Iron, Ferrous Sulfate, 325 (65 Fe) MG TABS Take 325 mg by mouth daily. 60 tablet 2   LINZESS 290 MCG CAPS capsule TAKE ONE CAPSULE BY  MOUTH EVERY MORNING BEFORE BREAKFAST 30 capsule 3   Melatonin 10 MG TABS Take 10 mg by mouth at bedtime.     MIRALAX 17 GM/SCOOP powder Take 17 g by mouth See admin instructions. Mix 17 g of powder into water and drink once a day     Multiple Vitamins-Minerals (ADULT ONE DAILY GUMMIES PO) Take 2 tablets by mouth daily.     Multiple Vitamins-Minerals (ZINC PO) Take 1 capsule by mouth daily.     Omega-3 Fatty Acids (FISH OIL) 1200 MG CAPS Take 1,200 mg by mouth daily.     potassium chloride SA (KLOR-CON M) 20 MEQ tablet Take 1 tablet (20 mEq total) by mouth 2 (two) times daily. (Patient not taking: Reported on 08/02/2022) 3 tablet 0   predniSONE (DELTASONE) 50 MG tablet Take 1 tablet (50 mg total) by mouth daily with breakfast. (Patient not taking: Reported on 08/02/2022) 5 tablet 0   promethazine (PHENERGAN) 12.5 MG tablet Take 12.5 mg by mouth See admin instructions. Take 12.5 mg by mouth every 6-8 hours as needed for nausea     sennosides-docusate sodium (SENOKOT-S) 8.6-50 MG tablet Take 1-2 tablets by mouth daily.     triamcinolone cream (KENALOG) 0.1 % APPLY TO AFFECTED AREA(S) TWO TIMES A DAY 45 g 1   VITAMIN A PO Take 1 capsule by mouth daily.      Allergies as of 09/21/2022 - Review Complete 09/21/2022  Allergen Reaction Noted   Amphetamine-dextroamphetamine Other (See Comments) 03/28/2019   Lupron [leuprolide] Anaphylaxis 07/05/2021   Orilissa [elagolix] Anaphylaxis 06/17/2021   Penicillins Anaphylaxis, Swelling, and Other (See Comments) 10/02/2011   Chlorhexidine Hives 08/17/2021   Doxycycline Itching, Nausea And Vomiting, and Other (See Comments) 11/21/2017   Mixed ragweed Other (See Comments) 03/25/2022   Olanzapine Other (See Comments) 10/02/2011   Other Other (See Comments) 11/21/2017   Prozac [fluoxetine hcl] Other (See  Comments) 10/02/2011   Stevia glycerite extract [flavoring agent] Other (See Comments) 03/25/2022   Tape Other (See Comments) 07/05/2021   Triple antibiotic pain relief [neomy-bacit-polymyx-pramoxine] Hives 03/07/2021   Reglan [metoclopramide] Anxiety and Other (See Comments) 06/23/2020    Family History  Problem Relation Age of Onset   Colon polyps Mother    Depression Mother    Anxiety disorder Mother    Hypertension Mother    Hyperlipidemia Mother    Asthma Mother    Endometriosis Mother    Fibroids Mother    Fibromyalgia Mother    Migraines Mother    Irritable bowel syndrome Mother    Depression Father    Hypertension Father    Hyperlipidemia Father    Bipolar disorder Father    Bipolar disorder Sister    ADD / ADHD Sister    Asthma Sister    Endometriosis Sister    Seizures Sister    Bipolar disorder Sister    Anxiety disorder Sister    Heart disease Maternal Grandmother    Endometriosis Maternal Grandmother    Fibroids Maternal Grandmother    Hyperlipidemia Maternal Grandmother    Hypertension Maternal Grandmother    Diabetes Maternal Grandmother    Fibromyalgia Maternal Grandmother    Heart disease Maternal Grandfather    Diabetes Maternal Grandfather    Heart disease Paternal Grandmother    Heart disease Paternal Grandfather    Colon cancer Maternal Great-grandmother    Pancreatic cancer Neg Hx    Esophageal cancer Neg Hx    Stomach cancer Neg Hx     Social History  Socioeconomic History   Marital status: Divorced    Spouse name: Not on file   Number of children: Not on file   Years of education: Not on file   Highest education level: Not on file  Occupational History   Not on file  Tobacco Use   Smoking status: Never   Smokeless tobacco: Never  Vaping Use   Vaping Use: Never used  Substance and Sexual Activity   Alcohol use: Yes    Alcohol/week: 1.0 standard drink of alcohol    Types: 1 Glasses of wine per week    Comment: 1 glass of wine  per week   Drug use: No   Sexual activity: Not Currently  Other Topics Concern   Not on file  Social History Narrative   Not on file   Social Determinants of Health   Financial Resource Strain: Not on file  Food Insecurity: No Food Insecurity (02/21/2021)   Hunger Vital Sign    Worried About Running Out of Food in the Last Year: Never true    Ran Out of Food in the Last Year: Never true  Transportation Needs: No Transportation Needs (02/21/2021)   PRAPARE - Hydrologist (Medical): No    Lack of Transportation (Non-Medical): No  Physical Activity: Not on file  Stress: Not on file  Social Connections: Not on file  Intimate Partner Violence: Not on file    Review of Systems: ROS unable to be obtained due to patient being very sleepy from receving ativan.  Physical Exam: Vital signs in last 24 hours: Temp:  [98 F (36.7 C)-98.5 F (36.9 C)] 98 F (36.7 C) (11/10 0815) Pulse Rate:  [50-84] 50 (11/10 1010) Resp:  [10-16] 11 (11/10 1010) BP: (100-130)/(65-108) 108/74 (11/10 1010) SpO2:  [93 %-99 %] 98 % (11/10 1010) Weight:  [52 kg] 52 kg (11/09 2228)   General:  Alert, very sleepy and unable to interact much during my visit. Head:  Normocephalic and atraumatic. Eyes:  Sclera clear, no icterus.  Conjunctiva pink. Ears:  Normal auditory acuity. Mouth:  No deformity or lesions.   Lungs:  Clear throughout to auscultation.  No wheezes, crackles, or rhonchi.  Heart:  Bradycardic. Abdomen:  Soft, non-distended.  BS present.  She did not elicit tenderness on my exam.   Msk:  Symmetrical without gross deformities. Pulses:  Normal pulses noted. Extremities:  Without clubbing or edema. Skin:  Intact without significant lesions or rashes.  Intake/Output from previous day: 11/09 0701 - 11/10 0700 In: 2032.3 [IV Piggyback:2032.3] Out: -  Intake/Output this shift: Total I/O In: 50 [IV Piggyback:50] Out: -   Lab Results: Recent Labs     09/21/22 2229  WBC 7.9  HGB 13.7  HCT 40.0  PLT 243   BMET Recent Labs    09/21/22 2229  NA 139  K 3.5  CL 111  CO2 23  GLUCOSE 115*  BUN 15  CREATININE 0.94  CALCIUM 9.4   LFT Recent Labs    09/21/22 2229  PROT 7.6  ALBUMIN 4.3  AST 17  ALT 13  ALKPHOS 70  BILITOT 0.7   Studies/Results: DG UGI W SINGLE CM (SOL OR THIN BA)  Result Date: 09/22/2022 CLINICAL DATA:  Abdominal pain, nausea, loose stools, history of hiatal hernia, change in gastric orientation on CT EXAM: DG UGI W SINGLE CM TECHNIQUE: Scout radiograph was obtained. Single contrast examination was performed using thin liquid barium. This exam was performed by Pasty Spillers, PA-C,  and was supervised and interpreted by Suzy Bouchard, MD. FLUOROSCOPY TIME:  Radiation Exposure Index (as provided by the fluoroscopic device): 9.80 mGy COMPARISON:  Same day CT A/P. FINDINGS: Scout Radiograph: Small bowel is gas filled though not distended. Colon gas-filled. Contrast in the bladder obscures the rectum but also appears gas-filled. Esophagus: No acute cervical early stricture mass. Stomach: Contrast flows freely through the stomach. The gastric body is oriented superiorly to the gastric fundus. This change in orientation is favored related to chronic elevation of the LEFT hemidiaphragm. There is no twisting or volvulus of the stomach. No obstruction. Contrast flows freely into the gastric antrum and duodenum. The duodenum crosses the spine from RIGHT to LEFT in typical fashion with ligament Treitz in normal location. Gastric emptying: Normal. Duodenum: Normal as above description the stomach section. IMPRESSION: 1. No gastric volvulus or obstruction. 2. Atypical orientation of the stomach related to elevation of the LEFT hemidiaphragm. 3. Normal duodenum. 4. No hiatal hernia. Findings conveyed toTrifani, MDon 09/22/2022  at09:44. Electronically Signed   By: Suzy Bouchard M.D.   On: 09/22/2022 09:45   CT ABDOMEN PELVIS W  CONTRAST  Result Date: 09/22/2022 CLINICAL DATA:  Right upper quadrant pain with nausea and vomiting for 2 days EXAM: CT ABDOMEN AND PELVIS WITH CONTRAST TECHNIQUE: Multidetector CT imaging of the abdomen and pelvis was performed using the standard protocol following bolus administration of intravenous contrast. RADIATION DOSE REDUCTION: This exam was performed according to the departmental dose-optimization program which includes automated exposure control, adjustment of the mA and/or kV according to patient size and/or use of iterative reconstruction technique. CONTRAST:  147m OMNIPAQUE IOHEXOL 300 MG/ML  SOLN COMPARISON:  CT 07/26/2022 FINDINGS: Lower chest: No acute abnormality. Hepatobiliary: No focal liver abnormality is seen. Distended gallbladder similar to prior. No gallstones, gallbladder wall thickening, or biliary dilatation. Pancreas: Unremarkable. No pancreatic ductal dilatation or surrounding inflammatory changes. Spleen: Normal in size without focal abnormality. Adrenals/Urinary Tract: Adrenal glands are unremarkable. Kidneys are normal, without renal calculi, focal lesion, or hydronephrosis. Bladder is unremarkable. Stomach/Bowel: Diffuse wall thickening about the stomach may be due to gastritis or underdistention. The orientation of the stomach has changed from 07/26/2022 with the gastric body oriented superiorly to the gastric fundus suggestive of volvulus. No evidence of ischemia. Normal caliber large and small bowel. Normal appendix. Colonic diverticulosis without diverticulitis. No bowel wall thickening. Vascular/Lymphatic: No significant vascular findings are present. No enlarged abdominal or pelvic lymph nodes. Reproductive: Status post hysterectomy. No adnexal masses. Other: No free intraperitoneal fluid or air. Musculoskeletal: No acute abnormality. IMPRESSION: Possible gastric volvulus. Upper GI may be beneficial for further evaluation if clinically warranted. No evidence of ischemia.  Wall thickening about the stomach may be due to underdistention or gastritis. Otherwise no acute abnormality in the abdomen or pelvis. Electronically Signed   By: TPlacido SouM.D.   On: 09/22/2022 03:07   UKoreaAbdomen Limited RUQ (LIVER/GB)  Result Date: 09/21/2022 CLINICAL DATA:  Right upper quadrant pain EXAM: ULTRASOUND ABDOMEN LIMITED RIGHT UPPER QUADRANT COMPARISON:  CT 03/23/2022 FINDINGS: Gallbladder: Negative for gallstones. Upper normal wall thickness. Negative sonographic Murphy. Slightly contracted. Common bile duct: Diameter: Poorly visualized.  No biliary dilatation. Liver: No focal lesion identified. Within normal limits in parenchymal echogenicity. Portal vein is patent on color Doppler imaging with normal direction of blood flow towards the liver. Other: None. IMPRESSION: Contracted gallbladder without visible gallstones. Electronically Signed   By: KDonavan FoilM.D.   On: 09/21/2022 23:40  IMPRESSION:  *36 year old female with complaints of upper abdominal pain and nausea with vomiting.  CT scan here suggested gastric volvulus.  Follow-up upper GI series this morning was negative for such.  Otherwise she did have an EGD in 2021 that suggested a partial gastric volvulus.  Suspect that she is likely having intermittent partial volvulus that is causing these periodic worsened symptoms. *History of gastroparesis *Ehlers-Danlos *ADHD and bipolar disorder *History of endometriosis status post TAH-BSO  PLAN: -Will likely need surgical repair/gastropexy, suspect electively.  She has been seen by the surgeons. -Otherwise pantoprazole 40 mg IV twice daily for now. -Suspect admission for observation and can slowly advance her diet as hopefully her symptoms will be improving with no further volvulus noted on upper GI series this morning.   Laban Emperor. Nazyia Gaugh  09/22/2022, 10:18 AM

## 2022-09-22 NOTE — Progress Notes (Signed)
UGI reviewed with MD - no obstruction or volvulus. No indication for surgical intervention. Okay for clears from surgical standpoint - GI eval pending. We will sign off but please do not hesitate to contact us with any questions or concerns or reconsult if needed.

## 2022-09-22 NOTE — ED Provider Notes (Signed)
36 yo female presenting to the ED with abdominal pain Pending GI consult for possible upper GI volvulus -> will need surgery vs medical admit  Physical Exam  BP 100/68   Pulse (!) 51   Temp 98.2 F (36.8 C)   Resp 10   Ht '5\' 3"'$  (1.6 m)   Wt 52 kg   LMP  (LMP Unknown)   SpO2 97%   BMI 20.31 kg/m   Physical Exam  Procedures  Procedures  ED Course / MDM   Clinical Course as of 09/22/22 0947  Fri Sep 22, 2022  0919 Spoke to [facility] PA they will discuss with attending and let me know recommendations [MT]  0940 I spoke to Dr Maricela Curet (sp?) from radiology who reviewed the upper GI images and does not feel this is indicative of a volvulus.  Therefore the patient will be admitted to medicine for pain control and Gi consultation, but there is no emergent indication for surgery at this time. [MT]  8921 Pt reasseessed and still in pain, vitals stable - will order pain medication, admit to medical service at this time. [MT]    Clinical Course User Index [MT] Johonna Binette, Carola Rhine, MD   Medical Decision Making Amount and/or Complexity of Data Reviewed Labs: ordered. Radiology: ordered.  Risk Prescription drug management. Decision regarding hospitalization.   I was contacted by Howie Ill at 850 and informed the patient in fact follows with [facility] and therefore a new consult was placed now with [facility].       Wyvonnia Dusky, MD 09/22/22 [phone]

## 2022-09-22 NOTE — H&P (Signed)
History and Physical    Patient: Rachel [name] ZES:[id number] DOB: 02/04/1986 DOA: 09/21/2022 DOS: the patient was seen and examined on 09/22/2022 PCP: Charlott Rakes, MD  Patient coming from: Home  Chief Complaint:  Chief Complaint  Patient presents with   Abdominal Pain   HPI: Rachel [name] is a 36 y.o. female with medical history significant of Ehlers-Danlos, asthma, anxiety/depression, GERD. Presenting with abdominal pain. She was in her normal state of health until a couple of days ago. She had some RUQ abdominal pain that worsened to a sharp, stabbing pain last night. She has had N/V and some diarrhea. She reports mild fever 100.5 yesterday. When her symptoms worsened last night, she decided to come to the ED for assistance. She denies any other aggravating or alleviating factors.   Review of Systems: As mentioned in the history of present illness. All other systems reviewed and are negative. Past Medical History:  Diagnosis Date   ADHD (attention deficit hyperactivity disorder)    Anemia    Anxiety    Asthma    Bipolar disorder ([place])    Complication of anesthesia    slow to wake up    Delayed gastric emptying 02/23/2020   Depression    DUB (dysfunctional uterine bleeding) 02/23/2020   Ehlers-Danlos syndrome    Endometriosis    Gastroparesis    GERD (gastroesophageal reflux disease)    Heart murmur    Hiatal hernia 04/06/2020   Myalgia 04/06/2020   Parasitic infection 02/23/2020   Pneumonia    Poor appetite 02/23/2020   RUQ pain 02/23/2020   Vitamin D deficiency    Weight loss 02/23/2020   Past Surgical History:  Procedure Laterality Date   barium study     COLONOSCOPY     CYSTOSCOPY N/A 07/12/2021   Procedure: CYSTOSCOPY;  Surgeon: [name], MD;  Location: [facility];  Service: Gynecology;  Laterality: N/A;   ESOPHAGOGASTRODUODENOSCOPY  2011   and a ph study as well.    LAPAROSCOPY N/A 01/26/2021   Procedure: LAPAROSCOPY DIAGNOSTIC;  Surgeon:  [name], MD;  Location: [facility];  Service: Gynecology;  Laterality: N/A;   NASAL SEPTUM SURGERY     TONGUE FLAP     TOTAL LAPAROSCOPIC HYSTERECTOMY WITH SALPINGECTOMY Bilateral 07/12/2021   Procedure: TOTAL LAPAROSCOPIC HYSTERECTOMY WITH SALPINGECTOMY, OOPHORECTOMY;  Surgeon: [name], MD;  Location: [facility];  Service: Gynecology;  Laterality: Bilateral;   UPPER GASTROINTESTINAL ENDOSCOPY     WISDOM TOOTH EXTRACTION     Social History:  reports that she has never smoked. She has never used smokeless tobacco. She reports current alcohol use of about 1.0 standard drink of alcohol per week. She reports that she does not use drugs.  Allergies  Allergen Reactions   Amphetamine-Dextroamphetamine Other (See Comments)    Triggered bipolar, caused depression, and suicidal thoughts   Lupron [Leuprolide] Anaphylaxis   Orilissa [Elagolix] Anaphylaxis   Penicillins Anaphylaxis, Swelling and Other (See Comments)    Potential for anaphylaxis confirmed. Tolerates Cefphalosporins No "-cillins"!!!! Did it involve swelling of the face/tongue/throat, SOB, or low BP? Yes Did it involve sudden or severe rash/hives, skin peeling, or any reaction on the inside of your mouth or nose? Unknown Did you need to seek medical attention at a hospital or doctor's office? Unknown When did it last happen? teenager      If all above answers are "NO", may proceed with cephalosporin use.   Chlorhexidine Hives    DuraPrep    Doxycycline  Itching, Nausea And Vomiting and Other (See Comments)    Caused high fever, also   Mixed Ragweed Other (See Comments)    Unknown, Mother and pt state she can't have    Olanzapine Other (See Comments)    Caused mania   Other Other (See Comments)    Stimulants, mood stabilizers, and antidepressants = Triggered bipolar, caused depression, and suicidal thoughts   Prozac [Fluoxetine Hcl] Other (See Comments)    Triggered bipolar, caused depression, and  suicidal thoughts   Stevia Glycerite Extract [Flavoring Agent] Other (See Comments)    Unknown reaction; mother and pt confirm any Artificial Sweeteners cause reaction   Tape Other (See Comments)    Paper tape is tolerated   Triple Antibiotic Pain Relief [Neomy-Bacit-Polymyx-Pramoxine] Hives   Reglan [Metoclopramide] Anxiety and Other (See Comments)    Triggered bipolar, caused depression, suicidal thoughts, muscle twitching, stiffness, and high anxiety     Family History  Problem Relation Age of Onset   Colon polyps Mother    Depression Mother    Anxiety disorder Mother    Hypertension Mother    Hyperlipidemia Mother    Asthma Mother    Endometriosis Mother    Fibroids Mother    Fibromyalgia Mother    Migraines Mother    Irritable bowel syndrome Mother    Depression Father    Hypertension Father    Hyperlipidemia Father    Bipolar disorder Father    Bipolar disorder Sister    ADD / ADHD Sister    Asthma Sister    Endometriosis Sister    Seizures Sister    Bipolar disorder Sister    Anxiety disorder Sister    Heart disease Maternal Grandmother    Endometriosis Maternal Grandmother    Fibroids Maternal Grandmother    Hyperlipidemia Maternal Grandmother    Hypertension Maternal Grandmother    Diabetes Maternal Grandmother    Fibromyalgia Maternal Grandmother    Heart disease Maternal Grandfather    Diabetes Maternal Grandfather    Heart disease Paternal Grandmother    Heart disease Paternal Grandfather    Colon cancer Maternal Great-grandmother    Pancreatic cancer Neg Hx    Esophageal cancer Neg Hx    Stomach cancer Neg Hx     Prior to Admission medications   Medication Sig Start Date End Date Taking? Authorizing Provider  albuterol (PROVENTIL) (2.5 MG/3ML) 0.083% nebulizer solution Take 3 mLs (2.5 mg total) by nebulization every 6 (six) hours as needed for wheezing or shortness of breath. Max 4 doses per day 07/26/22   Petrucelli, Samantha R, PA-C  albuterol  (VENTOLIN HFA) 108 (90 Base) MCG/ACT inhaler Inhale 1-2 puffs into the lungs every 6 (six) hours as needed for wheezing or shortness of breath. 12/16/21   Kandra Nicolas, MD  Ascorbic Acid (VITAMIN C) 1000 MG tablet Take 1,000 mg by mouth 2 (two) times daily.    [provider]  atorvastatin (LIPITOR) 20 MG tablet Take 1 tablet (20 mg total) by mouth daily. 08/02/22   Charlott Rakes, MD  azithromycin (ZITHROMAX) 250 MG tablet Take 1 tablet (250 mg total) by mouth daily. Take first 2 tablets together, then 1 every day until finished. Patient not taking: Reported on 08/02/2022 07/26/22   Petrucelli, Aldona Bar R, PA-C  B Complex Vitamins (B COMPLEX PO) Take 1 capsule by mouth daily.    [provider]  Calcium-Magnesium-Vitamin D 600-40-500 MG-MG-UNIT TB24 Take 3 tablets by mouth in the morning and at bedtime.  [provider]  cefpodoxime (VANTIN) 200 MG tablet Take 1 tablet (200 mg total) by mouth 2 (two) times daily. Patient not taking: Reported on 08/02/2022 07/26/22   Petrucelli, Glynda Jaeger, PA-C  Cholecalciferol (VITAMIN D3) 50 MCG (2000 UT) TABS Take 2,000 Units by mouth daily.    [provider]  ELDERBERRY PO Take 1 tablet by mouth daily.    [provider]  EPINEPHrine 0.3 mg/0.3 mL IJ SOAJ injection Inject 0.3 mg into the muscle as needed for anaphylaxis. 05/27/21   Levin Bacon, MD  estrogens, conjugated, (PREMARIN) 0.45 MG tablet Take 1 tablet (0.45 mg total) by mouth daily. 08/17/21   [name], MD  estrogens, conjugated, (PREMARIN) 0.625 MG tablet Take 1 tablet (0.625 mg total) by mouth daily. Take daily for 21 days then do not take for 7 days. 03/24/22 03/24/23  [name], MD  Ferrous Sulfate (IRON PO) Take 1 capsule by mouth daily.    [provider]  fexofenadine (ALLEGRA) 180 MG tablet Take 180 mg by mouth daily.    [provider]  ibuprofen (ADVIL) 600 MG tablet Take 1 tablet (600 mg total) by mouth every  6 (six) hours as needed for headache, mild pain, moderate pain or cramping. 07/13/21   [name], MD  Iron, Ferrous Sulfate, 325 (65 Fe) MG TABS Take 325 mg by mouth daily. 08/02/22   Charlott Rakes, MD  LINZESS 290 MCG CAPS capsule TAKE ONE CAPSULE BY MOUTH EVERY MORNING BEFORE BREAKFAST 07/25/22   Mauri Pole, MD  Melatonin 10 MG TABS Take 10 mg by mouth at bedtime.    [provider]  MIRALAX 17 GM/SCOOP powder Take 17 g by mouth See admin instructions. Mix 17 g of powder into water and drink once a day    [provider]  Multiple Vitamins-Minerals (ADULT ONE DAILY GUMMIES PO) Take 2 tablets by mouth daily.    [provider]  Multiple Vitamins-Minerals (ZINC PO) Take 1 capsule by mouth daily.    [provider]  Omega-3 Fatty Acids (FISH OIL) 1200 MG CAPS Take 1,200 mg by mouth daily.    [provider]  potassium chloride SA (KLOR-CON M) 20 MEQ tablet Take 1 tablet (20 mEq total) by mouth 2 (two) times daily. Patient not taking: Reported on 08/02/2022 07/26/22   Petrucelli, Glynda Jaeger, PA-C  predniSONE (DELTASONE) 50 MG tablet Take 1 tablet (50 mg total) by mouth daily with breakfast. Patient not taking: Reported on 08/02/2022 07/26/22   Petrucelli, Glynda Jaeger, PA-C  promethazine (PHENERGAN) 12.5 MG tablet Take 12.5 mg by mouth See admin instructions. Take 12.5 mg by mouth every 6-8 hours as needed for nausea    [provider]  sennosides-docusate sodium (SENOKOT-S) 8.6-50 MG tablet Take 1-2 tablets by mouth daily.    [provider]  triamcinolone cream (KENALOG) 0.1 % APPLY TO AFFECTED AREA(S) TWO TIMES A DAY 08/22/22   Charlott Rakes, MD  VITAMIN A PO Take 1 capsule by mouth daily.    [provider]    Physical Exam: Vitals:   09/22/22 0800 09/22/22 0815 09/22/22 1010 09/22/22 1030  BP: 104/77 100/68 108/74 96/69  Pulse: (!) 55 (!) 51 (!) 50 61  Resp: '11 10 11 13  '$ Temp:  98 F (36.7 C)    SpO2: 97%  97% 98% 97%  Weight:      Height:       General: 36 y.o. female resting in bed in NAD  Eyes: PERRL, normal sclera ENMT: Nares patent w/o discharge, orophaynx clear, dentition normal, ears w/o discharge/lesions/ulcers Neck: Supple, trachea midline Cardiovascular: RRR, +S1, S2, no m/g/r, equal pulses throughout Respiratory: CTABL, no w/r/r, normal WOB GI: BS hypoactive, ND, global TTP, no masses noted, no organomegaly noted MSK: No e/c/c Neuro: A&O x 3, no focal deficits Psyc: Appropriate interaction and affect, calm/cooperative  Data Reviewed:  Results for orders placed or performed during the hospital encounter of 09/21/22 (from the past 24 hour(s))  Lipase, blood     Status: Abnormal   Collection Time: 09/21/22 10:29 PM  Result Value Ref Range   Lipase 52 (H) 11 - 51 U/L  Comprehensive metabolic panel     Status: Abnormal   Collection Time: 09/21/22 10:29 PM  Result Value Ref Range   Sodium 139 135 - 145 mmol/L   Potassium 3.5 3.5 - 5.1 mmol/L   Chloride 111 98 - 111 mmol/L   CO2 23 22 - 32 mmol/L   Glucose, Bld 115 (H) 70 - 99 mg/dL   BUN 15 6 - 20 mg/dL   Creatinine, Ser 0.94 0.44 - 1.00 mg/dL   Calcium 9.4 8.9 - 10.3 mg/dL   Total Protein 7.6 6.5 - 8.1 g/dL   Albumin 4.3 3.5 - 5.0 g/dL   AST 17 15 - 41 U/L   ALT 13 0 - 44 U/L   Alkaline Phosphatase 70 38 - 126 U/L   Total Bilirubin 0.7 0.3 - 1.2 mg/dL   GFR, Estimated >60 >60 mL/min   Anion gap 5 5 - 15  CBC     Status: None   Collection Time: 09/21/22 10:29 PM  Result Value Ref Range   WBC 7.9 4.0 - 10.5 K/uL   RBC 4.33 3.87 - 5.11 MIL/uL   Hemoglobin 13.7 12.0 - 15.0 g/dL   HCT 40.0 36.0 - 46.0 %   MCV 92.4 80.0 - 100.0 fL   MCH 31.6 26.0 - 34.0 pg   MCHC 34.3 30.0 - 36.0 g/dL   RDW 12.9 11.5 - 15.5 %   Platelets 243 150 - 400 K/uL   nRBC 0.0 0.0 - 0.2 %  I-Stat beta hCG blood, ED     Status: Abnormal   Collection Time: 09/21/22 10:48 PM  Result Value Ref Range   I-stat hCG, quantitative 6.3 (H) <5 mIU/mL    Comment 3          Resp Panel by RT-PCR (Flu A&B, Covid) Anterior Nasal Swab     Status: None   Collection Time: 09/22/22 12:19 AM   Specimen: Anterior Nasal Swab  Result Value Ref Range   SARS Coronavirus 2 by RT PCR NEGATIVE NEGATIVE   Influenza A by PCR NEGATIVE NEGATIVE   Influenza B by PCR NEGATIVE NEGATIVE  Lactic acid, plasma     Status: None   Collection Time: 09/22/22  4:32 AM  Result Value Ref Range   Lactic Acid, Venous 0.7 0.5 - 1.9 mmol/L   RUQ [place] ab Contracted gallbladder without visible gallstones.   CT ab/pelvis Possible gastric volvulus. Upper GI may be beneficial for further evaluation if clinically warranted. No evidence of ischemia. Wall thickening about the stomach may be due to underdistention or gastritis. Otherwise no acute abnormality in the abdomen or pelvis.  UGI XR 1. No gastric volvulus or obstruction. 2. Atypical orientation of the stomach related to elevation of the LEFT hemidiaphragm. 3. Normal duodenum. 4. No hiatal hernia.  Assessment and Plan: Abdominal pain N/V     -  place in obs, progressive     - imaging as above; UGI performed and radiology does not see obstruction/volvulus     - patient still in pain     - [facility] onboard, appreciate assistance     - fluids, pain control (watch BPs), anti-emetics, NPO for now  Bipolar     - continue home regimen when off NPO status  Asthma     - continue home regimen when confirmed  GERD     - PPI  HLD     - continue home regimen when off NPO status  Advance Care Planning:   Code Status: FULL  Consults: [facility]; EDP spoke with General Surgery  Family Communication: w/ father at bedside  Severity of Illness: The appropriate patient status for this patient is OBSERVATION. Observation status is judged to be reasonable and necessary in order to provide the required intensity of service to ensure the patient's safety. The patient's presenting symptoms, physical exam findings, and initial  radiographic and laboratory data in the context of their medical condition is felt to place them at decreased risk for further clinical deterioration. Furthermore, it is anticipated that the patient will be medically stable for discharge from the hospital within 2 midnights of admission.   Author: Jonnie Finner, DO 09/22/2022 10:48 AM  For on call review [email or web address].

## 2022-09-22 NOTE — ED Provider Notes (Signed)
[facility] DEPT Provider Note  CSN: [id number] Arrival date & time: 09/21/22 2209  Chief Complaint(s) Abdominal Pain  HPI Rachel [name] is a 36 y.o. female with PMH ADHD, bipolar disorder, Erler Danlos, endometriosis, gastroparesis, multiple previous admissions for pyelonephritis who presents emergency department for evaluation of abdominal pain, nausea, diarrhea.  Patient states that for the last 48 hours her symptoms been progressively worsening and at approximately 2100 on 09/21/2022 she had severe right upper quadrant pain.  Currently denies chest pain, shortness of breath, fever or other systemic symptoms.  Endorses decreased urinary output.   Past Medical History Past Medical History:  Diagnosis Date   ADHD (attention deficit hyperactivity disorder)    Anemia    Anxiety    Asthma    Bipolar disorder ([place])    Complication of anesthesia    slow to wake up    Delayed gastric emptying 02/23/2020   Depression    DUB (dysfunctional uterine bleeding) 02/23/2020   Ehlers-Danlos syndrome    Endometriosis    Gastroparesis    GERD (gastroesophageal reflux disease)    Heart murmur    Hiatal hernia 04/06/2020   Myalgia 04/06/2020   Parasitic infection 02/23/2020   Pneumonia    Poor appetite 02/23/2020   RUQ pain 02/23/2020   Vitamin D deficiency    Weight loss 02/23/2020   Patient Active Problem List   Diagnosis Date Noted   Iron deficiency anemia 01/20/2022   Acute hyponatremia 01/18/2022   Pyelonephritis 01/18/2022   Protein-calorie malnutrition, severe 01/18/2022   sepsis secondary to pyelonephritis of left kidney 01/17/2022   Sepsis ([place]) 01/17/2022   Chronic abdominal pain 01/17/2022   Dyspnea 01/17/2022   Underweight 01/17/2022   Irritable bowel syndrome (IBS) 01/17/2022   Hypokalemia 01/17/2022   Vasomotor symptoms due to menopause 08/17/2021   S/P laparoscopic hysterectomy 07/12/2021   Preoperative exam for gynecologic surgery  06/20/2021   Adverse drug reaction 06/01/2021   Draining postoperative wound 02/28/2021   Encounter for postoperative care 02/16/2021   Endometriosis determined by laparoscopy    Pelvic pain in female 10/11/2020   Presence of subdermal contraceptive implant 10/11/2020   Dysmenorrhea 10/11/2020   Dyspareunia in female 10/11/2020   NSVT (nonsustained ventricular tachycardia) ([place]) 09/03/2020   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   Palpitations 07/26/2020   Family history of connective tissue disease 07/26/2020   Myalgia 04/06/2020   Hiatal hernia 04/06/2020   Parasitic infection 02/23/2020   Weight loss 02/23/2020   Poor appetite 02/23/2020   RUQ pain 02/23/2020   Delayed gastric emptying 02/23/2020   DUB (dysfunctional uterine bleeding) 02/23/2020   Cervical radiculopathy 05/31/2018   Hyperalgesia 05/31/2018   Acid reflux 12/26/2017   ADHD (attention deficit hyperactivity disorder) 05/07/2012   Bipolar 1 disorder ([place]) 05/07/2012   Home Medication(s) Prior to Admission medications   Medication Sig Start Date End Date Taking? Authorizing Provider  albuterol (PROVENTIL) (2.5 MG/3ML) 0.083% nebulizer solution Take 3 mLs (2.5 mg total) by nebulization every 6 (six) hours as needed for wheezing or shortness of breath. Max 4 doses per day 07/26/22   Petrucelli, Samantha R, PA-C  albuterol (VENTOLIN HFA) 108 (90 Base) MCG/ACT inhaler Inhale 1-2 puffs into the lungs every 6 (six) hours as needed for wheezing or shortness of breath. 12/16/21   Kandra Nicolas, MD  Ascorbic Acid (VITAMIN C) 1000 MG tablet Take 1,000 mg by mouth 2 (two) times daily.    [provider]  atorvastatin (LIPITOR) 20 MG tablet Take 1 tablet (20  mg total) by mouth daily. 08/02/22   Charlott Rakes, MD  azithromycin (ZITHROMAX) 250 MG tablet Take 1 tablet (250 mg total) by mouth daily. Take first 2 tablets together, then 1 every day until finished. Patient not taking: Reported on 08/02/2022 07/26/22   Petrucelli,  Aldona Bar R, PA-C  B Complex Vitamins (B COMPLEX PO) Take 1 capsule by mouth daily.    [provider]  Calcium-Magnesium-Vitamin D 600-40-500 MG-MG-UNIT TB24 Take 3 tablets by mouth in the morning and at bedtime.    [provider]  cefpodoxime (VANTIN) 200 MG tablet Take 1 tablet (200 mg total) by mouth 2 (two) times daily. Patient not taking: Reported on 08/02/2022 07/26/22   Petrucelli, Glynda Jaeger, PA-C  Cholecalciferol (VITAMIN D3) 50 MCG (2000 UT) TABS Take 2,000 Units by mouth daily.    [provider]  ELDERBERRY PO Take 1 tablet by mouth daily.    [provider]  EPINEPHrine 0.3 mg/0.3 mL IJ SOAJ injection Inject 0.3 mg into the muscle as needed for anaphylaxis. 05/27/21   Levin Bacon, MD  estrogens, conjugated, (PREMARIN) 0.45 MG tablet Take 1 tablet (0.45 mg total) by mouth daily. 08/17/21   [name], MD  estrogens, conjugated, (PREMARIN) 0.625 MG tablet Take 1 tablet (0.625 mg total) by mouth daily. Take daily for 21 days then do not take for 7 days. 03/24/22 03/24/23  [name], MD  Ferrous Sulfate (IRON PO) Take 1 capsule by mouth daily.    [provider]  fexofenadine (ALLEGRA) 180 MG tablet Take 180 mg by mouth daily.    [provider]  ibuprofen (ADVIL) 600 MG tablet Take 1 tablet (600 mg total) by mouth every 6 (six) hours as needed for headache, mild pain, moderate pain or cramping. 07/13/21   [name], MD  Iron, Ferrous Sulfate, 325 (65 Fe) MG TABS Take 325 mg by mouth daily. 08/02/22   Charlott Rakes, MD  LINZESS 290 MCG CAPS capsule TAKE ONE CAPSULE BY MOUTH EVERY MORNING BEFORE BREAKFAST 07/25/22   Mauri Pole, MD  Melatonin 10 MG TABS Take 10 mg by mouth at bedtime.    [provider]  MIRALAX 17 GM/SCOOP powder Take 17 g by mouth See admin instructions. Mix 17 g of powder into water and drink once a day    [provider]  Multiple Vitamins-Minerals (ADULT ONE DAILY  GUMMIES PO) Take 2 tablets by mouth daily.    [provider]  Multiple Vitamins-Minerals (ZINC PO) Take 1 capsule by mouth daily.    [provider]  Omega-3 Fatty Acids (FISH OIL) 1200 MG CAPS Take 1,200 mg by mouth daily.    [provider]  potassium chloride SA (KLOR-CON M) 20 MEQ tablet Take 1 tablet (20 mEq total) by mouth 2 (two) times daily. Patient not taking: Reported on 08/02/2022 07/26/22   Petrucelli, Glynda Jaeger, PA-C  predniSONE (DELTASONE) 50 MG tablet Take 1 tablet (50 mg total) by mouth daily with breakfast. Patient not taking: Reported on 08/02/2022 07/26/22   Petrucelli, Glynda Jaeger, PA-C  promethazine (PHENERGAN) 12.5 MG tablet Take 12.5 mg by mouth See admin instructions. Take 12.5 mg by mouth every 6-8 hours as needed for nausea    [provider]  sennosides-docusate sodium (SENOKOT-S) 8.6-50 MG tablet Take 1-2 tablets by mouth daily.    [provider]  triamcinolone cream (KENALOG) 0.1 % APPLY TO AFFECTED AREA(S) TWO TIMES A DAY 08/22/22   Charlott Rakes, MD  VITAMIN  A PO Take 1 capsule by mouth daily.    [provider]                                                                                                                                    Past Surgical History Past Surgical History:  Procedure Laterality Date   barium study     COLONOSCOPY     CYSTOSCOPY N/A 07/12/2021   Procedure: CYSTOSCOPY;  Surgeon: [name], MD;  Location: [facility];  Service: Gynecology;  Laterality: N/A;   ESOPHAGOGASTRODUODENOSCOPY  2011   and a ph study as well.    LAPAROSCOPY N/A 01/26/2021   Procedure: LAPAROSCOPY DIAGNOSTIC;  Surgeon: [name], MD;  Location: [facility];  Service: Gynecology;  Laterality: N/A;   NASAL SEPTUM SURGERY     TONGUE FLAP     TOTAL LAPAROSCOPIC HYSTERECTOMY WITH SALPINGECTOMY Bilateral 07/12/2021   Procedure: TOTAL LAPAROSCOPIC HYSTERECTOMY WITH SALPINGECTOMY, OOPHORECTOMY;   Surgeon: [name], MD;  Location: [facility];  Service: Gynecology;  Laterality: Bilateral;   UPPER GASTROINTESTINAL ENDOSCOPY     WISDOM TOOTH EXTRACTION     Family History Family History  Problem Relation Age of Onset   Colon polyps Mother    Depression Mother    Anxiety disorder Mother    Hypertension Mother    Hyperlipidemia Mother    Asthma Mother    Endometriosis Mother    Fibroids Mother    Fibromyalgia Mother    Migraines Mother    Irritable bowel syndrome Mother    Depression Father    Hypertension Father    Hyperlipidemia Father    Bipolar disorder Father    Bipolar disorder Sister    ADD / ADHD Sister    Asthma Sister    Endometriosis Sister    Seizures Sister    Bipolar disorder Sister    Anxiety disorder Sister    Heart disease Maternal Grandmother    Endometriosis Maternal Grandmother    Fibroids Maternal Grandmother    Hyperlipidemia Maternal Grandmother    Hypertension Maternal Grandmother    Diabetes Maternal Grandmother    Fibromyalgia Maternal Grandmother    Heart disease Maternal Grandfather    Diabetes Maternal Grandfather    Heart disease Paternal Grandmother    Heart disease Paternal Grandfather    Colon cancer Maternal Great-grandmother    Pancreatic cancer Neg Hx    Esophageal cancer Neg Hx    Stomach cancer Neg Hx     Social History Social History   Tobacco Use   Smoking status: Never   Smokeless tobacco: Never  Vaping Use   Vaping Use: Never used  Substance Use Topics   Alcohol use: Yes    Alcohol/week: 1.0 standard drink of alcohol    Types: 1 Glasses of wine per week    Comment: 1 glass of wine per week   Drug use: No  Allergies Amphetamine-dextroamphetamine, Lupron [leuprolide], Orilissa [elagolix], Penicillins, Chlorhexidine, Doxycycline, Mixed ragweed, Olanzapine, Other, Prozac [fluoxetine hcl], Stevia glycerite extract [flavoring agent], Tape, Triple antibiotic pain relief [neomy-bacit-polymyx-pramoxine], and Reglan  [metoclopramide]  Review of Systems Review of Systems  Gastrointestinal:  Positive for abdominal pain, diarrhea, nausea and vomiting.    Physical Exam Vital Signs  I have reviewed the triage vital signs BP 104/70   Pulse (!) 56   Temp 98.5 F (36.9 C)   Resp 16   Ht '5\' 3"'$  (1.6 m)   Wt 52 kg   LMP  (LMP Unknown)   SpO2 98%   BMI 20.31 kg/m   Physical Exam Vitals and nursing note reviewed.  Constitutional:      General: She is not in acute distress.    Appearance: She is well-developed.  HENT:     Head: Normocephalic and atraumatic.  Eyes:     Conjunctiva/sclera: Conjunctivae normal.  Cardiovascular:     Rate and Rhythm: Normal rate and regular rhythm.     Heart sounds: No murmur heard. Pulmonary:     Effort: Pulmonary effort is normal. No respiratory distress.     Breath sounds: Normal breath sounds.  Abdominal:     Palpations: Abdomen is soft.     Tenderness: There is abdominal tenderness in the right upper quadrant.  Musculoskeletal:        General: No swelling.     Cervical back: Neck supple.  Skin:    General: Skin is warm and dry.     Capillary Refill: Capillary refill takes less than 2 seconds.  Neurological:     Mental Status: She is alert.  Psychiatric:        Mood and Affect: Mood normal.     ED Results and Treatments Labs (all labs ordered are listed, but only abnormal results are displayed) Labs Reviewed  LIPASE, BLOOD - Abnormal; Notable for the following components:      Result Value   Lipase 52 (*)    All other components within normal limits  COMPREHENSIVE METABOLIC PANEL - Abnormal; Notable for the following components:   Glucose, Bld 115 (*)    All other components within normal limits  I-STAT BETA HCG BLOOD, ED ([facility], [facility], [facility] ONLY) - Abnormal; Notable for the following components:   I-stat hCG, quantitative 6.3 (*)    All other components within normal limits  RESP PANEL BY RT-PCR (FLU A&B, COVID) ARPGX2  CBC  URINALYSIS, ROUTINE W  REFLEX MICROSCOPIC                                                                                                                          Radiology US Abdomen Limited RUQ (LIVER/GB)  Result Date: 09/21/2022 CLINICAL DATA:  Right upper quadrant pain EXAM: ULTRASOUND ABDOMEN LIMITED RIGHT UPPER QUADRANT COMPARISON:  CT 03/23/2022 FINDINGS: Gallbladder: Negative for gallstones. Upper normal wall thickness. Negative sonographic Murphy. Slightly contracted. Common bile duct: Diameter: Poorly visualized.  No biliary dilatation. Liver:  No focal lesion identified. Within normal limits in parenchymal echogenicity. Portal vein is patent on color Doppler imaging with normal direction of blood flow towards the liver. Other: None. IMPRESSION: Contracted gallbladder without visible gallstones. Electronically Signed   By: Donavan Foil M.D.   On: 09/21/2022 23:40    Pertinent labs & imaging results that were available during my care of the patient were reviewed by me and considered in my medical decision making (see MDM for details).  Medications Ordered in ED Medications  oxyCODONE-acetaminophen (PERCOCET/ROXICET) 5-325 MG per tablet 1 tablet (1 tablet Oral Given 09/21/22 2234)  lactated ringers bolus 1,000 mL (1,000 mLs Intravenous New Bag/Given 09/22/22 0057)  morphine (PF) 4 MG/ML injection 4 mg (4 mg Intravenous Given 09/22/22 0057)  ondansetron (ZOFRAN) injection 4 mg (4 mg Intravenous Given 09/22/22 0056)                                                                                                                                     Procedures Procedures  (including critical care time)  Medical Decision Making / ED Course   This patient presents to the ED for concern of abdominal pain, nausea, vomiting, this involves an extensive number of treatment options, and is a complaint that carries with it a high risk of complications and morbidity.  The differential diagnosis includes intra-abdominal  infection, nephrolithiasis, cholecystitis, choledocholithiasis, gastritis, volvulus, SBO  MDM: Patient seen in the emergency department for evaluation of abdominal pain, nausea, vomiting.  Physical exam reveals an ill-appearing patient with epigastric and right upper quadrant tenderness to palpation.  Laboratory evaluation largely unremarkable outside of a very mildly elevated lipase to 5.2.  Lactic acid normal.  COVID and flu negative.  Right quadrant ultrasound with a contracted gallbladder but is otherwise unremarkable.  CT abdomen pelvis with concern for possible gastric volvulus.  Due to the patient's increased risk for gastric volvulus in the setting of her Erler's Danlos, I spoke with the general surgeon on-call Dr. Marcello Moores who politely informed me that she will not evaluate the patient from a surgical standpoint unless the diagnosis has been confirmed via upper GI and that I should reach out to gastroenterology.  I did speak with the gastroenterologist on-call who will evaluate the patient early in the morning.  Upper GI studies are not available in the middle of the night and thus I spoke with the radiologist on-call Dr. Lovena Le who helped facilitate urgent upper GI study at 7:30 AM on 09/22/2022.  At time of signout, patient is pending upper GI.  Patient will require hospital admission regardless given inability tolerate p.o. and if positive will require surgical admission, and if negative will require medical admission.   Additional history obtained: -Additional history obtained from father -External records from outside source obtained and reviewed including: Chart review including previous notes, labs, imaging, consultation notes   Lab Tests: -I ordered, reviewed, and interpreted labs.  The pertinent results include:   Labs Reviewed  LIPASE, BLOOD - Abnormal; Notable for the following components:      Result Value   Lipase 52 (*)    All other components within normal limits   COMPREHENSIVE METABOLIC PANEL - Abnormal; Notable for the following components:   Glucose, Bld 115 (*)    All other components within normal limits  I-STAT BETA HCG BLOOD, ED ([facility], [facility], [facility] ONLY) - Abnormal; Notable for the following components:   I-stat hCG, quantitative 6.3 (*)    All other components within normal limits  RESP PANEL BY RT-PCR (FLU A&B, COVID) ARPGX2  CBC  URINALYSIS, ROUTINE W REFLEX MICROSCOPIC      Imaging Studies ordered: I ordered imaging studies including CT abdomen pelvis, right upper quadrant ultrasound I independently visualized and interpreted imaging. I agree with the radiologist interpretation   Medicines ordered and prescription drug management: Meds ordered this encounter  Medications   oxyCODONE-acetaminophen (PERCOCET/ROXICET) 5-325 MG per tablet 1 tablet   lactated ringers bolus 1,000 mL   morphine (PF) 4 MG/ML injection 4 mg   ondansetron (ZOFRAN) injection 4 mg    -I have reviewed the patients home medicines and have made adjustments as needed  Critical interventions none  Consultations Obtained: I requested consultation with the general surgeon on-call Dr. Marcello Moores, gastroenterologist on-call, radiologist on-call,  and discussed lab and imaging findings as well as pertinent plan - they recommend: Upper GI study   Cardiac Monitoring: The patient was maintained on a cardiac monitor.  I personally viewed and interpreted the cardiac monitored which showed an underlying rhythm of: NSR  Social Determinants of Health:  Factors impacting patients care include: none   Reevaluation: After the interventions noted above, I reevaluated the patient and found that they have :stayed the same  Co morbidities that complicate the patient evaluation  Past Medical History:  Diagnosis Date   ADHD (attention deficit hyperactivity disorder)    Anemia    Anxiety    Asthma    Bipolar disorder ([place])    Complication of anesthesia    slow to wake up     Delayed gastric emptying 02/23/2020   Depression    DUB (dysfunctional uterine bleeding) 02/23/2020   Ehlers-Danlos syndrome    Endometriosis    Gastroparesis    GERD (gastroesophageal reflux disease)    Heart murmur    Hiatal hernia 04/06/2020   Myalgia 04/06/2020   Parasitic infection 02/23/2020   Pneumonia    Poor appetite 02/23/2020   RUQ pain 02/23/2020   Vitamin D deficiency    Weight loss 02/23/2020      Dispostion: I considered admission for this patient, and disposition will be pending upper GI study but patient likely will require hospital admission regardless     Final Clinical Impression(s) / ED Diagnoses Final diagnoses:  None     '@PCDICTATION'$ @    Teressa Lower, MD 09/22/22 1641

## 2022-09-22 NOTE — Consult Note (Signed)
CC: Nausea and vomiting  Requesting provider: Dr Matilde Sprang  HPI: Rachel [name] is a 36 y.o. F with bipolar disorder, Erler Danlos, endometriosis, gastroparesis and multiple previous admissions for pyelonephritis who presents emergency department for evaluation of abdominal pain, nausea, diarrhea.  Patient states that for the last 48 hours her symptoms been progressively worsening and at approximately 2100 on 09/21/2022 she had severe right upper quadrant pain.    Past Medical History:  Diagnosis Date   ADHD (attention deficit hyperactivity disorder)    Anemia    Anxiety    Asthma    Bipolar disorder ([place])    Complication of anesthesia    slow to wake up    Delayed gastric emptying 02/23/2020   Depression    DUB (dysfunctional uterine bleeding) 02/23/2020   Ehlers-Danlos syndrome    Endometriosis    Gastroparesis    GERD (gastroesophageal reflux disease)    Heart murmur    Hiatal hernia 04/06/2020   Myalgia 04/06/2020   Parasitic infection 02/23/2020   Pneumonia    Poor appetite 02/23/2020   RUQ pain 02/23/2020   Vitamin D deficiency    Weight loss 02/23/2020    Past Surgical History:  Procedure Laterality Date   barium study     COLONOSCOPY     CYSTOSCOPY N/A 07/12/2021   Procedure: CYSTOSCOPY;  Surgeon: [name], MD;  Location: [facility];  Service: Gynecology;  Laterality: N/A;   ESOPHAGOGASTRODUODENOSCOPY  2011   and a ph study as well.    LAPAROSCOPY N/A 01/26/2021   Procedure: LAPAROSCOPY DIAGNOSTIC;  Surgeon: [name], MD;  Location: [facility];  Service: Gynecology;  Laterality: N/A;   NASAL SEPTUM SURGERY     TONGUE FLAP     TOTAL LAPAROSCOPIC HYSTERECTOMY WITH SALPINGECTOMY Bilateral 07/12/2021   Procedure: TOTAL LAPAROSCOPIC HYSTERECTOMY WITH SALPINGECTOMY, OOPHORECTOMY;  Surgeon: [name], MD;  Location: [facility];  Service: Gynecology;  Laterality: Bilateral;   UPPER GASTROINTESTINAL ENDOSCOPY     WISDOM TOOTH  EXTRACTION      Family History  Problem Relation Age of Onset   Colon polyps Mother    Depression Mother    Anxiety disorder Mother    Hypertension Mother    Hyperlipidemia Mother    Asthma Mother    Endometriosis Mother    Fibroids Mother    Fibromyalgia Mother    Migraines Mother    Irritable bowel syndrome Mother    Depression Father    Hypertension Father    Hyperlipidemia Father    Bipolar disorder Father    Bipolar disorder Sister    ADD / ADHD Sister    Asthma Sister    Endometriosis Sister    Seizures Sister    Bipolar disorder Sister    Anxiety disorder Sister    Heart disease Maternal Grandmother    Endometriosis Maternal Grandmother    Fibroids Maternal Grandmother    Hyperlipidemia Maternal Grandmother    Hypertension Maternal Grandmother    Diabetes Maternal Grandmother    Fibromyalgia Maternal Grandmother    Heart disease Maternal Grandfather    Diabetes Maternal Grandfather    Heart disease Paternal Grandmother    Heart disease Paternal Grandfather    Colon cancer Maternal Great-grandmother    Pancreatic cancer Neg Hx    Esophageal cancer Neg Hx    Stomach cancer Neg Hx     Social:  reports that she has never smoked. She has never used smokeless tobacco. She reports current alcohol  use of about 1.0 standard drink of alcohol per week. She reports that she does not use drugs.  Allergies:  Allergies  Allergen Reactions   Amphetamine-Dextroamphetamine Other (See Comments)    Triggered bipolar, caused depression, and suicidal thoughts   Lupron [Leuprolide] Anaphylaxis   Orilissa [Elagolix] Anaphylaxis   Penicillins Anaphylaxis, Swelling and Other (See Comments)    Potential for anaphylaxis confirmed. Tolerates Cefphalosporins No "-cillins"!!!! Did it involve swelling of the face/tongue/throat, SOB, or low BP? Yes Did it involve sudden or severe rash/hives, skin peeling, or any reaction on the inside of your mouth or nose? Unknown Did you need to  seek medical attention at a hospital or doctor's office? Unknown When did it last happen? teenager      If all above answers are "NO", may proceed with cephalosporin use.   Chlorhexidine Hives    DuraPrep    Doxycycline Itching, Nausea And Vomiting and Other (See Comments)    Caused high fever, also   Mixed Ragweed Other (See Comments)    Unknown, Mother and pt state she can't have    Olanzapine Other (See Comments)    Caused mania   Other Other (See Comments)    Stimulants, mood stabilizers, and antidepressants = Triggered bipolar, caused depression, and suicidal thoughts   Prozac [Fluoxetine Hcl] Other (See Comments)    Triggered bipolar, caused depression, and suicidal thoughts   Stevia Glycerite Extract [Flavoring Agent] Other (See Comments)    Unknown reaction; mother and pt confirm any Artificial Sweeteners cause reaction   Tape Other (See Comments)    Paper tape is tolerated   Triple Antibiotic Pain Relief [Neomy-Bacit-Polymyx-Pramoxine] Hives   Reglan [Metoclopramide] Anxiety and Other (See Comments)    Triggered bipolar, caused depression, suicidal thoughts, muscle twitching, stiffness, and high anxiety     Medications: I have reviewed the patient's current medications.  Results for orders placed or performed during the hospital encounter of 09/21/22 (from the past 48 hour(s))  Lipase, blood     Status: Abnormal   Collection Time: 09/21/22 10:29 PM  Result Value Ref Range   Lipase 52 (H) 11 - 51 U/L    Comment: Performed at [facility], [place] [address], [place], [place] [address]  Comprehensive metabolic panel     Status: Abnormal   Collection Time: 09/21/22 10:29 PM  Result Value Ref Range   Sodium 139 135 - 145 mmol/L   Potassium 3.5 3.5 - 5.1 mmol/L   Chloride 111 98 - 111 mmol/L   CO2 23 22 - 32 mmol/L   Glucose, Bld 115 (H) 70 - 99 mg/dL    Comment: Glucose reference range applies only to samples taken after fasting for at least 8 hours.   BUN  15 6 - 20 mg/dL   Creatinine, Ser 0.94 0.44 - 1.00 mg/dL   Calcium 9.4 8.9 - 10.3 mg/dL   Total Protein 7.6 6.5 - 8.1 g/dL   Albumin 4.3 3.5 - 5.0 g/dL   AST 17 15 - 41 U/L   ALT 13 0 - 44 U/L   Alkaline Phosphatase 70 38 - 126 U/L   Total Bilirubin 0.7 0.3 - 1.2 mg/dL   GFR, Estimated >60 >60 mL/min    Comment: (NOTE) Calculated using the CKD-EPI Creatinine Equation (2021)    Anion gap 5 5 - 15    Comment: Performed at [facility], [place] [address], [place],  [address]  CBC     Status: None   Collection  Time: 09/21/22 10:29 PM  Result Value Ref Range   WBC 7.9 4.0 - 10.5 K/uL   RBC 4.33 3.87 - 5.11 MIL/uL   Hemoglobin 13.7 12.0 - 15.0 g/dL   HCT 40.0 36.0 - 46.0 %   MCV 92.4 80.0 - 100.0 fL   MCH 31.6 26.0 - 34.0 pg   MCHC 34.3 30.0 - 36.0 g/dL   RDW 12.9 11.5 - 15.5 %   Platelets 243 150 - 400 K/uL   nRBC 0.0 0.0 - 0.2 %    Comment: Performed at [facility], [place] [address], [place], [place] [address]  I-Stat beta hCG blood, ED     Status: Abnormal   Collection Time: 09/21/22 10:48 PM  Result Value Ref Range   I-stat hCG, quantitative 6.3 (H) <5 mIU/mL   Comment 3            Comment:   GEST. AGE      CONC.  (mIU/mL)   <=1 WEEK        5 - 50     2 WEEKS       50 - 500     3 WEEKS       100 - 10,000     4 WEEKS     1,000 - 30,000        FEMALE AND NON-PREGNANT FEMALE:     LESS THAN 5 mIU/mL   Resp Panel by RT-PCR (Flu A&B, Covid) Anterior Nasal Swab     Status: None   Collection Time: 09/22/22 12:19 AM   Specimen: Anterior Nasal Swab  Result Value Ref Range   SARS Coronavirus 2 by RT PCR NEGATIVE NEGATIVE    Comment: (NOTE) SARS-CoV-2 target nucleic acids are NOT DETECTED.  The SARS-CoV-2 RNA is generally detectable in upper respiratory specimens during the acute phase of infection. The lowest concentration of SARS-CoV-2 viral copies this assay can detect is 138 copies/mL. A negative result does not preclude  SARS-Cov-2 infection and should not be used as the sole basis for treatment or other patient management decisions. A negative result may occur with  improper specimen collection/handling, submission of specimen other than nasopharyngeal swab, presence of viral mutation(s) within the areas targeted by this assay, and inadequate number of viral copies(<138 copies/mL). A negative result must be combined with clinical observations, patient history, and epidemiological information. The expected result is Negative.  Fact Sheet for Patients:  EntrepreneurPulse.com.au  Fact Sheet for Healthcare Providers:  IncredibleEmployment.be  This test is no t yet approved or cleared by the [place] FDA and  has been authorized for detection and/or diagnosis of SARS-CoV-2 by FDA under an Emergency Use Authorization (EUA). This EUA will remain  in effect (meaning this test can be used) for the duration of the COVID-19 declaration under Section 564(b)(1) of the Act, 21 U.S.C.section 360bbb-3(b)(1), unless the authorization is terminated  or revoked sooner.       Influenza A by PCR NEGATIVE NEGATIVE   Influenza B by PCR NEGATIVE NEGATIVE    Comment: (NOTE) The Xpert Xpress SARS-CoV-2/FLU/RSV plus assay is intended as an aid in the diagnosis of influenza from Nasopharyngeal swab specimens and should not be used as a sole basis for treatment. Nasal washings and aspirates are unacceptable for Xpert Xpress SARS-CoV-2/FLU/RSV testing.  Fact Sheet for Patients: EntrepreneurPulse.com.au  Fact Sheet for Healthcare Providers: IncredibleEmployment.be  This test is not yet approved or cleared by the [place] FDA and has been authorized for detection and/or diagnosis  of SARS-CoV-2 by FDA under an Emergency Use Authorization (EUA). This EUA will remain in effect (meaning this test can be used) for the duration of the COVID-19  declaration under Section 564(b)(1) of the Act, 21 U.S.C. section 360bbb-3(b)(1), unless the authorization is terminated or revoked.  Performed at [facility], [place] [address], [place], [place] [address]     CT ABDOMEN PELVIS W CONTRAST  Result Date: 09/22/2022 CLINICAL DATA:  Right upper quadrant pain with nausea and vomiting for 2 days EXAM: CT ABDOMEN AND PELVIS WITH CONTRAST TECHNIQUE: Multidetector CT imaging of the abdomen and pelvis was performed using the standard protocol following bolus administration of intravenous contrast. RADIATION DOSE REDUCTION: This exam was performed according to the departmental dose-optimization program which includes automated exposure control, adjustment of the mA and/or kV according to patient size and/or use of iterative reconstruction technique. CONTRAST:  158m OMNIPAQUE IOHEXOL 300 MG/ML  SOLN COMPARISON:  CT 07/26/2022 FINDINGS: Lower chest: No acute abnormality. Hepatobiliary: No focal liver abnormality is seen. Distended gallbladder similar to prior. No gallstones, gallbladder wall thickening, or biliary dilatation. Pancreas: Unremarkable. No pancreatic ductal dilatation or surrounding inflammatory changes. Spleen: Normal in size without focal abnormality. Adrenals/Urinary Tract: Adrenal glands are unremarkable. Kidneys are normal, without renal calculi, focal lesion, or hydronephrosis. Bladder is unremarkable. Stomach/Bowel: Diffuse wall thickening about the stomach may be due to gastritis or underdistention. The orientation of the stomach has changed from 07/26/2022 with the gastric body oriented superiorly to the gastric fundus suggestive of volvulus. No evidence of ischemia. Normal caliber large and small bowel. Normal appendix. Colonic diverticulosis without diverticulitis. No bowel wall thickening. Vascular/Lymphatic: No significant vascular findings are present. No enlarged abdominal or pelvic lymph nodes. Reproductive: Status post  hysterectomy. No adnexal masses. Other: No free intraperitoneal fluid or air. Musculoskeletal: No acute abnormality. IMPRESSION: Possible gastric volvulus. Upper GI may be beneficial for further evaluation if clinically warranted. No evidence of ischemia. Wall thickening about the stomach may be due to underdistention or gastritis. Otherwise no acute abnormality in the abdomen or pelvis. Electronically Signed   By: TPlacido SouM.D.   On: 09/22/2022 03:07   UKoreaAbdomen Limited RUQ (LIVER/GB)  Result Date: 09/21/2022 CLINICAL DATA:  Right upper quadrant pain EXAM: ULTRASOUND ABDOMEN LIMITED RIGHT UPPER QUADRANT COMPARISON:  CT 03/23/2022 FINDINGS: Gallbladder: Negative for gallstones. Upper normal wall thickness. Negative sonographic Murphy. Slightly contracted. Common bile duct: Diameter: Poorly visualized.  No biliary dilatation. Liver: No focal lesion identified. Within normal limits in parenchymal echogenicity. Portal vein is patent on color Doppler imaging with normal direction of blood flow towards the liver. Other: None. IMPRESSION: Contracted gallbladder without visible gallstones. Electronically Signed   By: KDonavan FoilM.D.   On: 09/21/2022 23:40    ROS - all of the below systems have been reviewed with the patient and positives are indicated with bold text General: chills, fever or night sweats Eyes: blurry vision or double vision ENT: epistaxis or sore throat Hematologic/Lymphatic: bleeding problems, blood clots or swollen lymph nodes Endocrine: temperature intolerance or unexpected weight changes Breast: new or changing breast lumps or nipple discharge Resp: cough, shortness of breath, or wheezing CV: chest pain or dyspnea on exertion GI: as per HPI GU: dysuria, trouble voiding, or hematuria Neuro: TIA or stroke symptoms    PE Blood pressure 123/80, pulse 63, temperature 98 F (36.7 C), resp. rate 14, height '5\' 3"'$  (1.6 m), weight 52 kg, SpO2 99 %. Constitutional: NAD;  conversant; no deformities Eyes:  Moist conjunctiva; no lid lag; anicteric; PERRL Neck: Trachea midline; no thyromegaly Lungs: Normal respiratory effort CV: RRR GI: Abd soft, no rebound or guarding,  TTP subxyphoid MSK: Normal range of motion of extremities; no clubbing/cyanosis Psychiatric: Appropriate affect; alert and oriented x3  Results for orders placed or performed during the hospital encounter of 09/21/22 (from the past 48 hour(s))  Lipase, blood     Status: Abnormal   Collection Time: 09/21/22 10:29 PM  Result Value Ref Range   Lipase 52 (H) 11 - 51 U/L    Comment: Performed at [facility], [place] [address], [place], [place] [address]  Comprehensive metabolic panel     Status: Abnormal   Collection Time: 09/21/22 10:29 PM  Result Value Ref Range   Sodium 139 135 - 145 mmol/L   Potassium 3.5 3.5 - 5.1 mmol/L   Chloride 111 98 - 111 mmol/L   CO2 23 22 - 32 mmol/L   Glucose, Bld 115 (H) 70 - 99 mg/dL    Comment: Glucose reference range applies only to samples taken after fasting for at least 8 hours.   BUN 15 6 - 20 mg/dL   Creatinine, Ser 0.94 0.44 - 1.00 mg/dL   Calcium 9.4 8.9 - 10.3 mg/dL   Total Protein 7.6 6.5 - 8.1 g/dL   Albumin 4.3 3.5 - 5.0 g/dL   AST 17 15 - 41 U/L   ALT 13 0 - 44 U/L   Alkaline Phosphatase 70 38 - 126 U/L   Total Bilirubin 0.7 0.3 - 1.2 mg/dL   GFR, Estimated >60 >60 mL/min    Comment: (NOTE) Calculated using the CKD-EPI Creatinine Equation (2021)    Anion gap 5 5 - 15    Comment: Performed at [facility], [place] [address] [place], [place] [address]  CBC     Status: None   Collection Time: 09/21/22 10:29 PM  Result Value Ref Range   WBC 7.9 4.0 - 10.5 K/uL   RBC 4.33 3.87 - 5.11 MIL/uL   Hemoglobin 13.7 12.0 - 15.0 g/dL   HCT 40.0 36.0 - 46.0 %   MCV 92.4 80.0 - 100.0 fL   MCH 31.6 26.0 - 34.0 pg   MCHC 34.3 30.0 - 36.0 g/dL   RDW 12.9 11.5 - 15.5 %   Platelets 243 150 - 400 K/uL   nRBC 0.0 0.0 -  0.2 %    Comment: Performed at [facility], [place] [address], [place], [place] [address]  I-Stat beta hCG blood, ED     Status: Abnormal   Collection Time: 09/21/22 10:48 PM  Result Value Ref Range   I-stat hCG, quantitative 6.3 (H) <5 mIU/mL   Comment 3            Comment:   GEST. AGE      CONC.  (mIU/mL)   <=1 WEEK        5 - 50     2 WEEKS       50 - 500     3 WEEKS       100 - 10,000     4 WEEKS     1,000 - 30,000        FEMALE AND NON-PREGNANT FEMALE:     LESS THAN 5 mIU/mL   Resp Panel by RT-PCR (Flu A&B, Covid) Anterior Nasal Swab     Status: None   Collection Time: 09/22/22 12:19 AM   Specimen: Anterior Nasal Swab  Result Value Ref Range   SARS Coronavirus 2 by RT PCR NEGATIVE NEGATIVE    Comment: (NOTE) SARS-CoV-2 target nucleic acids are NOT DETECTED.  The SARS-CoV-2 RNA is generally detectable in upper respiratory specimens during the acute phase of infection. The lowest concentration of SARS-CoV-2 viral copies this assay can detect is 138 copies/mL. A negative result does not preclude SARS-Cov-2 infection and should not be used as the sole basis for treatment or other patient management decisions. A negative result may occur with  improper specimen collection/handling, submission of specimen other than nasopharyngeal swab, presence of viral mutation(s) within the areas targeted by this assay, and inadequate number of viral copies(<138 copies/mL). A negative result must be combined with clinical observations, patient history, and epidemiological information. The expected result is Negative.  Fact Sheet for Patients:  EntrepreneurPulse.com.au  Fact Sheet for Healthcare Providers:  IncredibleEmployment.be  This test is no t yet approved or cleared by the [place] FDA and  has been authorized for detection and/or diagnosis of SARS-CoV-2 by FDA under an Emergency Use Authorization (EUA). This EUA will remain   in effect (meaning this test can be used) for the duration of the COVID-19 declaration under Section 564(b)(1) of the Act, 21 U.S.C.section 360bbb-3(b)(1), unless the authorization is terminated  or revoked sooner.       Influenza A by PCR NEGATIVE NEGATIVE   Influenza B by PCR NEGATIVE NEGATIVE    Comment: (NOTE) The Xpert Xpress SARS-CoV-2/FLU/RSV plus assay is intended as an aid in the diagnosis of influenza from Nasopharyngeal swab specimens and should not be used as a sole basis for treatment. Nasal washings and aspirates are unacceptable for Xpert Xpress SARS-CoV-2/FLU/RSV testing.  Fact Sheet for Patients: EntrepreneurPulse.com.au  Fact Sheet for Healthcare Providers: IncredibleEmployment.be  This test is not yet approved or cleared by the [place] FDA and has been authorized for detection and/or diagnosis of SARS-CoV-2 by FDA under an Emergency Use Authorization (EUA). This EUA will remain in effect (meaning this test can be used) for the duration of the COVID-19 declaration under Section 564(b)(1) of the Act, 21 U.S.C. section 360bbb-3(b)(1), unless the authorization is terminated or revoked.  Performed at [facility], [place] [address] [place], [place] [address]     CT ABDOMEN PELVIS W CONTRAST  Result Date: 09/22/2022 CLINICAL DATA:  Right upper quadrant pain with nausea and vomiting for 2 days EXAM: CT ABDOMEN AND PELVIS WITH CONTRAST TECHNIQUE: Multidetector CT imaging of the abdomen and pelvis was performed using the standard protocol following bolus administration of intravenous contrast. RADIATION DOSE REDUCTION: This exam was performed according to the departmental dose-optimization program which includes automated exposure control, adjustment of the mA and/or kV according to patient size and/or use of iterative reconstruction technique. CONTRAST:  126m OMNIPAQUE IOHEXOL 300 MG/ML  SOLN COMPARISON:  CT  07/26/2022 FINDINGS: Lower chest: No acute abnormality. Hepatobiliary: No focal liver abnormality is seen. Distended gallbladder similar to prior. No gallstones, gallbladder wall thickening, or biliary dilatation. Pancreas: Unremarkable. No pancreatic ductal dilatation or surrounding inflammatory changes. Spleen: Normal in size without focal abnormality. Adrenals/Urinary Tract: Adrenal glands are unremarkable. Kidneys are normal, without renal calculi, focal lesion, or hydronephrosis. Bladder is unremarkable. Stomach/Bowel: Diffuse wall thickening about the stomach may be due to gastritis or underdistention. The orientation of the stomach has changed from 07/26/2022 with the gastric body oriented superiorly to the gastric fundus suggestive of volvulus. No evidence of ischemia. Normal caliber large and small bowel. Normal appendix. Colonic diverticulosis  without diverticulitis. No bowel wall thickening. Vascular/Lymphatic: No significant vascular findings are present. No enlarged abdominal or pelvic lymph nodes. Reproductive: Status post hysterectomy. No adnexal masses. Other: No free intraperitoneal fluid or air. Musculoskeletal: No acute abnormality. IMPRESSION: Possible gastric volvulus. Upper GI may be beneficial for further evaluation if clinically warranted. No evidence of ischemia. Wall thickening about the stomach may be due to underdistention or gastritis. Otherwise no acute abnormality in the abdomen or pelvis. Electronically Signed   By: Placido Sou M.D.   On: 09/22/2022 03:07   US Abdomen Limited RUQ (LIVER/GB)  Result Date: 09/21/2022 CLINICAL DATA:  Right upper quadrant pain EXAM: ULTRASOUND ABDOMEN LIMITED RIGHT UPPER QUADRANT COMPARISON:  CT 03/23/2022 FINDINGS: Gallbladder: Negative for gallstones. Upper normal wall thickness. Negative sonographic Murphy. Slightly contracted. Common bile duct: Diameter: Poorly visualized.  No biliary dilatation. Liver: No focal lesion identified. Within  normal limits in parenchymal echogenicity. Portal vein is patent on color Doppler imaging with normal direction of blood flow towards the liver. Other: None. IMPRESSION: Contracted gallbladder without visible gallstones. Electronically Signed   By: Donavan Foil M.D.   On: 09/21/2022 23:40     A/P: Rachel [name] is an 36 y.o. female with nausea vomiting and diarrhea.  She has no acute surgical issues but her symptoms and CT findings concerning and need further work up.  Rec UGI eval and GI consult.      Rosario Adie, MD  Colorectal and General Surgery [facility]  Total time of evaluation, examination, counseling and implementing medical decisions was 75 mins.  high medical decision making.

## 2022-09-23 DIAGNOSIS — R935 Abnormal findings on diagnostic imaging of other abdominal regions, including retroperitoneum: Secondary | ICD-10-CM

## 2022-09-23 DIAGNOSIS — R112 Nausea with vomiting, unspecified: Secondary | ICD-10-CM | POA: Diagnosis not present

## 2022-09-23 DIAGNOSIS — R1013 Epigastric pain: Secondary | ICD-10-CM | POA: Diagnosis not present

## 2022-09-23 DIAGNOSIS — R1010 Upper abdominal pain, unspecified: Secondary | ICD-10-CM | POA: Diagnosis not present

## 2022-09-23 DIAGNOSIS — K3189 Other diseases of stomach and duodenum: Secondary | ICD-10-CM | POA: Diagnosis not present

## 2022-09-23 LAB — CBC
HCT: 34.3 % — ABNORMAL LOW (ref 36.0–46.0)
Hemoglobin: 11.7 g/dL — ABNORMAL LOW (ref 12.0–15.0)
MCH: 31.7 pg (ref 26.0–34.0)
MCHC: 34.1 g/dL (ref 30.0–36.0)
MCV: 93 fL (ref 80.0–100.0)
Platelets: 179 10*3/uL (ref 150–400)
RBC: 3.69 MIL/uL — ABNORMAL LOW (ref 3.87–5.11)
RDW: 12.7 % (ref 11.5–15.5)
WBC: 7.1 10*3/uL (ref 4.0–10.5)
nRBC: 0 % (ref 0.0–0.2)

## 2022-09-23 LAB — COMPREHENSIVE METABOLIC PANEL
ALT: 12 U/L (ref 0–44)
AST: 17 U/L (ref 15–41)
Albumin: 3.1 g/dL — ABNORMAL LOW (ref 3.5–5.0)
Alkaline Phosphatase: 48 U/L (ref 38–126)
Anion gap: 3 — ABNORMAL LOW (ref 5–15)
BUN: 13 mg/dL (ref 6–20)
CO2: 22 mmol/L (ref 22–32)
Calcium: 8.4 mg/dL — ABNORMAL LOW (ref 8.9–10.3)
Chloride: 112 mmol/L — ABNORMAL HIGH (ref 98–111)
Creatinine, Ser: 0.53 mg/dL (ref 0.44–1.00)
GFR, Estimated: >60 mL/min (ref >60)
Glucose, Bld: 129 mg/dL — ABNORMAL HIGH (ref 70–99)
Potassium: 3 mmol/L — ABNORMAL LOW (ref 3.5–5.1)
Sodium: 137 mmol/L (ref 135–145)
Total Bilirubin: 0.6 mg/dL (ref 0.3–1.2)
Total Protein: 5.8 g/dL — ABNORMAL LOW (ref 6.5–8.1)

## 2022-09-23 MED ORDER — AMITRIPTYLINE HCL 25 MG PO TABS: 25.0000 mg | ORAL_TABLET | Freq: Every day | ORAL | 0 refills | Status: DC

## 2022-09-23 MED ORDER — ACETAMINOPHEN 325 MG PO TABS: 650.0000 mg | ORAL_TABLET | Freq: Four times a day (QID) | ORAL | Status: DC | PRN

## 2022-09-23 MED ORDER — TRAMADOL HCL 50 MG PO TABS: 50.0000 mg | ORAL_TABLET | Freq: Four times a day (QID) | ORAL | 0 refills | Status: AC | PRN

## 2022-09-23 MED ORDER — TRAMADOL HCL 50 MG PO TABS: 50.0000 mg | ORAL_TABLET | Freq: Four times a day (QID) | ORAL | Status: DC

## 2022-09-23 MED ORDER — AMITRIPTYLINE HCL 25 MG PO TABS: 25.0000 mg | ORAL_TABLET | Freq: Every day | ORAL | Status: DC

## 2022-09-23 NOTE — Progress Notes (Signed)
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   She asked to avoid narcotics. I discontinued Fentanyl, started low-dose amitriptyline for improved chronic pain control and added Tramadol PRN for acute symptoms. Home later today if she is able to eat. I will arrange outpatient follow-up with Dr. Silverio Decamp as well as referral to [facility] (We discussed Dr. Dwaine Deter in particular)  Hypokalemia. Recommend replacement prior to discharge.   Normocytic anemia without overt bleeding. Hemoglobin appears o range from 10.6-13.2 over the last year.   Reflux esophagitis on prior EGD. Continue PPI therapy.   Subjective: Still having some abdominal pain but feels well enough that she would prefer to go home. Tolerated clear liquid diet last night. Frustrated by the care in the ED. Wished to avoid narcotics.  Mom is present at the bedside.  Objective:  Vital signs in last 24 hours: Temp:  [98.2 F (36.8 C)-98.8 F (37.1 C)] 98.7 F (37.1 C) (11/11 0338) Pulse Rate:  [49-68] 64 (11/11 0645) Resp:  [10-19] 19 (11/11 0645) BP: (96-114)/(62-79) 97/63 (11/11 0645) SpO2:  [94 %-98 %] 97 % (11/11 0645)   General:   Alert, in NAD Heart:  Regular rate and rhythm; no murmurs Pulm: Clear anteriorly; no wheezing Abdomen:  Soft. Thin. Bowel sounds are hypoactive. Nondistended. No pain with palpation. No rebound or guarding. LAD: No inguinal or umbilical LAD Extremities:  Without edema. Neurologic:  Alert and  oriented x4;  grossly normal neurologically. Psych:  Alert and cooperative. Normal mood and affect.  Lab Results: Recent Labs    09/21/22 2229 09/22/22 1211 09/23/22 0445  WBC 7.9  5.9 7.1  HGB 13.7 11.1* 11.7*  HCT 40.0 32.8* 34.3*  PLT 243 175 179   BMET Recent Labs    09/21/22 2229 09/22/22 1211 09/23/22 0450  NA 139 140 137  K 3.5 3.1* 3.0*  CL 111 112* 112*  CO2 '23 25 22  '$ GLUCOSE 115* 84 129*  BUN '15 10 13  '$ CREATININE 0.94 0.62 0.53  CALCIUM 9.4 8.5* 8.4*   LFT Recent Labs    09/23/22 0450  PROT 5.8*  ALBUMIN 3.1*  AST 17  ALT 12  ALKPHOS 48  BILITOT 0.6   DG UGI W SINGLE CM (SOL OR THIN BA)  Result Date: 09/22/2022 CLINICAL DATA:  Abdominal pain, nausea, loose stools, history of hiatal hernia, change in gastric orientation on CT EXAM: DG UGI W SINGLE CM TECHNIQUE: Scout radiograph was obtained. Single contrast examination was performed using thin liquid barium. This exam was performed by Pasty Spillers, PA-C, and was supervised and interpreted by Suzy Bouchard, MD. FLUOROSCOPY TIME:  Radiation Exposure Index (as provided by the fluoroscopic device): 9.80 mGy COMPARISON:  Same day CT A/P. FINDINGS: Scout Radiograph: Small bowel is gas filled though not distended. Colon gas-filled. Contrast in the bladder obscures the rectum but also appears gas-filled. Esophagus: No acute cervical early stricture mass. Stomach: Contrast flows freely through the stomach. The gastric body is oriented superiorly to the gastric fundus. This change in orientation is favored related to chronic elevation of the LEFT hemidiaphragm. There is no twisting or volvulus of the stomach. No obstruction. Contrast flows freely into the gastric antrum and duodenum. The duodenum crosses the spine  from RIGHT to LEFT in typical fashion with ligament Treitz in normal location. Gastric emptying: Normal. Duodenum: Normal as above description the stomach section. IMPRESSION: 1. No gastric volvulus or obstruction. 2. Atypical orientation of the stomach related to elevation of the LEFT hemidiaphragm. 3. Normal duodenum. 4. No hiatal hernia. Findings conveyed toTrifani, MDon 09/22/2022  at09:44.  Electronically Signed   By: Suzy Bouchard M.D.   On: 09/22/2022 09:45   CT ABDOMEN PELVIS W CONTRAST  Result Date: 09/22/2022 CLINICAL DATA:  Right upper quadrant pain with nausea and vomiting for 2 days EXAM: CT ABDOMEN AND PELVIS WITH CONTRAST TECHNIQUE: Multidetector CT imaging of the abdomen and pelvis was performed using the standard protocol following bolus administration of intravenous contrast. RADIATION DOSE REDUCTION: This exam was performed according to the departmental dose-optimization program which includes automated exposure control, adjustment of the mA and/or kV according to patient size and/or use of iterative reconstruction technique. CONTRAST:  12m OMNIPAQUE IOHEXOL 300 MG/ML  SOLN COMPARISON:  CT 07/26/2022 FINDINGS: Lower chest: No acute abnormality. Hepatobiliary: No focal liver abnormality is seen. Distended gallbladder similar to prior. No gallstones, gallbladder wall thickening, or biliary dilatation. Pancreas: Unremarkable. No pancreatic ductal dilatation or surrounding inflammatory changes. Spleen: Normal in size without focal abnormality. Adrenals/Urinary Tract: Adrenal glands are unremarkable. Kidneys are normal, without renal calculi, focal lesion, or hydronephrosis. Bladder is unremarkable. Stomach/Bowel: Diffuse wall thickening about the stomach may be due to gastritis or underdistention. The orientation of the stomach has changed from 07/26/2022 with the gastric body oriented superiorly to the gastric fundus suggestive of volvulus. No evidence of ischemia. Normal caliber large and small bowel. Normal appendix. Colonic diverticulosis without diverticulitis. No bowel wall thickening. Vascular/Lymphatic: No significant vascular findings are present. No enlarged abdominal or pelvic lymph nodes. Reproductive: Status post hysterectomy. No adnexal masses. Other: No free intraperitoneal fluid or air. Musculoskeletal: No acute abnormality. IMPRESSION: Possible gastric volvulus.  Upper GI may be beneficial for further evaluation if clinically warranted. No evidence of ischemia. Wall thickening about the stomach may be due to underdistention or gastritis. Otherwise no acute abnormality in the abdomen or pelvis. Electronically Signed   By: TPlacido SouM.D.   On: 09/22/2022 03:07   UKoreaAbdomen Limited RUQ (LIVER/GB)  Result Date: 09/21/2022 CLINICAL DATA:  Right upper quadrant pain EXAM: ULTRASOUND ABDOMEN LIMITED RIGHT UPPER QUADRANT COMPARISON:  CT 03/23/2022 FINDINGS: Gallbladder: Negative for gallstones. Upper normal wall thickness. Negative sonographic Murphy. Slightly contracted. Common bile duct: Diameter: Poorly visualized.  No biliary dilatation. Liver: No focal lesion identified. Within normal limits in parenchymal echogenicity. Portal vein is patent on color Doppler imaging with normal direction of blood flow towards the liver. Other: None. IMPRESSION: Contracted gallbladder without visible gallstones. Electronically Signed   By: KDonavan FoilM.D.   On: 09/21/2022 23:40       LOS: 0 days   [place] 09/23/2022, 9:50 AM

## 2022-09-23 NOTE — ED Notes (Signed)
Dr. Lesia Sago at bedside, discharging patient from the ED.

## 2022-09-23 NOTE — Discharge Summary (Signed)
Physician Discharge Summary   Patient: Rachel [name] MRN: [id number] DOB: [birth year]  Admit date:     09/21/2022  Discharge date: 09/23/2022  Discharge Physician: Alma Friendly   PCP: Charlott Rakes, MD   Recommendations at discharge:   Follow-up with PCP in 1 week Outpatient follow-up with GI as well as a referral to [facility]  Discharge Diagnoses: Principal Problem:   Abdominal pain Active Problems:   Bipolar 1 disorder (HCC)   GERD (gastroesophageal reflux disease)   Asthma, chronic   HLD (hyperlipidemia)   Nausea & vomiting   Abnormal CT of the abdomen    Hospital Course: Rachel [name] is a 36 y.o. female with medical history significant of Ehlers-Danlos, asthma, anxiety/depression, GERD. Presenting with abdominal pain. She was in her normal state of health until a couple of days ago. She had some RUQ abdominal pain that worsened to a sharp, stabbing pain. She has had N/V and some diarrhea. She reports mild fever 100.5. When her symptoms worsened, she decided to come to the ED for further management.    Today, patient reported feeling much better, able to tolerate her diet, with less abdominal pain.  Mother at bedside.  Discussed about discharge plan, patient and mother verbalized understanding.   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 as per upper GI General surgery consulted, recommend GI consultation Outpatient follow-up with GI and referral sent [facility] GI recommended low-dose amitriptyline as well as tramadol as needed for acute symptoms  Hypokalemia Replace as needed  GERD Continue PPI  Hyperlipidemia Continue statins      Consultants: GI, general surgery Procedures performed: None Disposition: Home Diet recommendation:  Regular diet    DISCHARGE MEDICATION: Allergies as of 09/23/2022       Reactions    Amphetamine-dextroamphetamine Other (See Comments)   Triggered bipolar, caused depression, and suicidal thoughts   Lupron [leuprolide] Anaphylaxis   Orilissa [elagolix] Anaphylaxis   Penicillins Anaphylaxis, Swelling, Other (See Comments)   Potential for anaphylaxis confirmed. Tolerates Cefphalosporins No "-cillins"!!!! Did it involve swelling of the face/tongue/throat, SOB, or low BP? Yes Did it involve sudden or severe rash/hives, skin peeling, or any reaction on the inside of your mouth or nose? Unknown Did you need to seek medical attention at a hospital or doctor's office? Unknown When did it last happen? teenager      If all above answers are "NO", may proceed with cephalosporin use.   Chlorhexidine Hives   DuraPrep    Doxycycline Itching, Nausea And Vomiting, Other (See Comments)   Caused high fever, also   Mixed Ragweed Other (See Comments)   Unknown, Mother and pt state she can't have    Olanzapine Other (See Comments)   Caused mania   Other Other (See Comments)   Stimulants, mood stabilizers, and antidepressants = Triggered bipolar, caused depression, and suicidal thoughts   Prozac [fluoxetine Hcl] Other (See Comments)   Triggered bipolar, caused depression, and suicidal thoughts   Stevia Glycerite Extract [flavoring Agent] Other (See Comments)   Unknown reaction; mother and pt confirm any Artificial Sweeteners cause reaction   Tape Other (See Comments)   Paper tape is tolerated   Triple Antibiotic Pain Relief [neomy-bacit-polymyx-pramoxine] Hives   Reglan [metoclopramide] Anxiety, Other (See Comments)   Triggered bipolar, caused depression, suicidal thoughts, muscle twitching, stiffness, and high anxiety         Medication List     STOP taking  these medications    azithromycin 250 MG tablet Commonly known as: ZITHROMAX   cefpodoxime 200 MG tablet Commonly known as: VANTIN   predniSONE 50 MG tablet Commonly known as: DELTASONE       TAKE these medications     ADULT ONE DAILY GUMMIES PO Take 2 tablets by mouth daily.   albuterol 108 (90 Base) MCG/ACT inhaler Commonly known as: VENTOLIN HFA Inhale 1-2 puffs into the lungs every 6 (six) hours as needed for wheezing or shortness of breath.   albuterol (2.5 MG/3ML) 0.083% nebulizer solution Commonly known as: PROVENTIL Take 3 mLs (2.5 mg total) by nebulization every 6 (six) hours as needed for wheezing or shortness of breath. Max 4 doses per day   amitriptyline 25 MG tablet Commonly known as: ELAVIL Take 1 tablet (25 mg total) by mouth at bedtime.   atorvastatin 20 MG tablet Commonly known as: LIPITOR Take 1 tablet (20 mg total) by mouth daily.   B COMPLEX PO Take 1 capsule by mouth daily.   Calcium-Magnesium-Vitamin D 600-40-500 MG-MG-UNIT Tb24 Take 3 tablets by mouth daily.   ELDERBERRY PO Take 1 tablet by mouth daily.   EPINEPHrine 0.3 mg/0.3 mL Soaj injection Commonly known as: EPI-PEN Inject 0.3 mg into the muscle as needed for anaphylaxis.   estrogens (conjugated) 0.625 MG tablet Commonly known as: Premarin Take 1 tablet (0.625 mg total) by mouth daily. Take daily for 21 days then do not take for 7 days. What changed: Another medication with the same name was removed. Continue taking this medication, and follow the directions you see here.   fexofenadine 180 MG tablet Commonly known as: ALLEGRA Take 180 mg by mouth daily.   Fish Oil 1200 MG Caps Take 1,200 mg by mouth daily.   Iron (Ferrous Sulfate) 325 (65 Fe) MG Tabs Take 325 mg by mouth daily.   Linzess 290 MCG Caps capsule Generic drug: linaclotide TAKE ONE CAPSULE BY MOUTH EVERY MORNING BEFORE BREAKFAST What changed: See the new instructions.   Melatonin 10 MG Tabs Take 10 mg by mouth at bedtime.   MiraLax 17 GM/SCOOP powder Generic drug: polyethylene glycol powder Take 17 g by mouth See admin instructions. Mix 17 g of powder into water and drink once a day   potassium chloride SA 20 MEQ  tablet Commonly known as: KLOR-CON M Take 1 tablet (20 mEq total) by mouth 2 (two) times daily.   promethazine 12.5 MG tablet Commonly known as: PHENERGAN Take 12.5 mg by mouth See admin instructions. Take 12.5 mg by mouth every 6-8 hours as needed for nausea   sennosides-docusate sodium 8.6-50 MG tablet Commonly known as: SENOKOT-S Take 1-2 tablets by mouth daily as needed for constipation.   traMADol 50 MG tablet Commonly known as: ULTRAM Take 1 tablet (50 mg total) by mouth every 6 (six) hours as needed for up to 7 days.   triamcinolone cream 0.1 % Commonly known as: KENALOG APPLY TO AFFECTED AREA(S) TWO TIMES A DAY What changed: See the new instructions.   VITAMIN A PO Take 1 capsule by mouth daily.   vitamin C 1000 MG tablet Take 1,000 mg by mouth 2 (two) times daily.   Vitamin D3 50 MCG (2000 UT) Tabs Take 2,000 Units by mouth daily.        Follow-up Information     Charlott Rakes, MD. Schedule an appointment as soon as possible for a visit in 1 week(s).   Specialty: Family Medicine Contact information: [employer]  [address] [place] [address] [phone]                Discharge Exam: Danley Danker Weights   09/21/22 2228  Weight: 52 kg   General: NAD, appears younger than stated age Cardiovascular: S1, S2 present Respiratory: CTAB Abdomen: Soft, +tender, nondistended, bowel sounds present Musculoskeletal: No bilateral pedal edema noted Skin: Normal Psychiatry: Normal mood   Condition at discharge: good  The results of significant diagnostics from this hospitalization (including imaging, microbiology, ancillary and laboratory) are listed below for reference.   Imaging Studies: DG UGI W SINGLE CM (SOL OR THIN BA)  Result Date: 09/22/2022 CLINICAL DATA:  Abdominal pain, nausea, loose stools, history of hiatal hernia, change in gastric orientation on CT EXAM: DG UGI W SINGLE CM TECHNIQUE: Scout radiograph was obtained. Single contrast  examination was performed using thin liquid barium. This exam was performed by Pasty Spillers, PA-C, and was supervised and interpreted by Suzy Bouchard, MD. FLUOROSCOPY TIME:  Radiation Exposure Index (as provided by the fluoroscopic device): 9.80 mGy COMPARISON:  Same day CT A/P. FINDINGS: Scout Radiograph: Small bowel is gas filled though not distended. Colon gas-filled. Contrast in the bladder obscures the rectum but also appears gas-filled. Esophagus: No acute cervical early stricture mass. Stomach: Contrast flows freely through the stomach. The gastric body is oriented superiorly to the gastric fundus. This change in orientation is favored related to chronic elevation of the LEFT hemidiaphragm. There is no twisting or volvulus of the stomach. No obstruction. Contrast flows freely into the gastric antrum and duodenum. The duodenum crosses the spine from RIGHT to LEFT in typical fashion with ligament Treitz in normal location. Gastric emptying: Normal. Duodenum: Normal as above description the stomach section. IMPRESSION: 1. No gastric volvulus or obstruction. 2. Atypical orientation of the stomach related to elevation of the LEFT hemidiaphragm. 3. Normal duodenum. 4. No hiatal hernia. Findings conveyed toTrifani, MDon 09/22/2022  at09:44. Electronically Signed   By: Suzy Bouchard M.D.   On: 09/22/2022 09:45   CT ABDOMEN PELVIS W CONTRAST  Result Date: 09/22/2022 CLINICAL DATA:  Right upper quadrant pain with nausea and vomiting for 2 days EXAM: CT ABDOMEN AND PELVIS WITH CONTRAST TECHNIQUE: Multidetector CT imaging of the abdomen and pelvis was performed using the standard protocol following bolus administration of intravenous contrast. RADIATION DOSE REDUCTION: This exam was performed according to the departmental dose-optimization program which includes automated exposure control, adjustment of the mA and/or kV according to patient size and/or use of iterative reconstruction technique. CONTRAST:   180m OMNIPAQUE IOHEXOL 300 MG/ML  SOLN COMPARISON:  CT 07/26/2022 FINDINGS: Lower chest: No acute abnormality. Hepatobiliary: No focal liver abnormality is seen. Distended gallbladder similar to prior. No gallstones, gallbladder wall thickening, or biliary dilatation. Pancreas: Unremarkable. No pancreatic ductal dilatation or surrounding inflammatory changes. Spleen: Normal in size without focal abnormality. Adrenals/Urinary Tract: Adrenal glands are unremarkable. Kidneys are normal, without renal calculi, focal lesion, or hydronephrosis. Bladder is unremarkable. Stomach/Bowel: Diffuse wall thickening about the stomach may be due to gastritis or underdistention. The orientation of the stomach has changed from 07/26/2022 with the gastric body oriented superiorly to the gastric fundus suggestive of volvulus. No evidence of ischemia. Normal caliber large and small bowel. Normal appendix. Colonic diverticulosis without diverticulitis. No bowel wall thickening. Vascular/Lymphatic: No significant vascular findings are present. No enlarged abdominal or pelvic lymph nodes. Reproductive: Status post hysterectomy. No adnexal masses. Other: No free intraperitoneal fluid or air. Musculoskeletal: No acute abnormality. IMPRESSION: Possible gastric volvulus. Upper GI  may be beneficial for further evaluation if clinically warranted. No evidence of ischemia. Wall thickening about the stomach may be due to underdistention or gastritis. Otherwise no acute abnormality in the abdomen or pelvis. Electronically Signed   By: Placido Sou M.D.   On: 09/22/2022 03:07   US Abdomen Limited RUQ (LIVER/GB)  Result Date: 09/21/2022 CLINICAL DATA:  Right upper quadrant pain EXAM: ULTRASOUND ABDOMEN LIMITED RIGHT UPPER QUADRANT COMPARISON:  CT 03/23/2022 FINDINGS: Gallbladder: Negative for gallstones. Upper normal wall thickness. Negative sonographic Murphy. Slightly contracted. Common bile duct: Diameter: Poorly visualized.  No biliary  dilatation. Liver: No focal lesion identified. Within normal limits in parenchymal echogenicity. Portal vein is patent on color Doppler imaging with normal direction of blood flow towards the liver. Other: None. IMPRESSION: Contracted gallbladder without visible gallstones. Electronically Signed   By: Donavan Foil M.D.   On: 09/21/2022 23:40    Microbiology: Results for orders placed or performed during the hospital encounter of 09/21/22  Resp Panel by RT-PCR (Flu A&B, Covid) Anterior Nasal Swab     Status: None   Collection Time: 09/22/22 12:19 AM   Specimen: Anterior Nasal Swab  Result Value Ref Range Status   SARS Coronavirus 2 by RT PCR NEGATIVE NEGATIVE Final    Comment: (NOTE) SARS-CoV-2 target nucleic acids are NOT DETECTED.  The SARS-CoV-2 RNA is generally detectable in upper respiratory specimens during the acute phase of infection. The lowest concentration of SARS-CoV-2 viral copies this assay can detect is 138 copies/mL. A negative result does not preclude SARS-Cov-2 infection and should not be used as the sole basis for treatment or other patient management decisions. A negative result may occur with  improper specimen collection/handling, submission of specimen other than nasopharyngeal swab, presence of viral mutation(s) within the areas targeted by this assay, and inadequate number of viral copies(<138 copies/mL). A negative result must be combined with clinical observations, patient history, and epidemiological information. The expected result is Negative.  Fact Sheet for Patients:  EntrepreneurPulse.com.au  Fact Sheet for Healthcare Providers:  IncredibleEmployment.be  This test is no t yet approved or cleared by the [place] FDA and  has been authorized for detection and/or diagnosis of SARS-CoV-2 by FDA under an Emergency Use Authorization (EUA). This EUA will remain  in effect (meaning this test can be used) for the  duration of the COVID-19 declaration under Section 564(b)(1) of the Act, 21 U.S.C.section 360bbb-3(b)(1), unless the authorization is terminated  or revoked sooner.       Influenza A by PCR NEGATIVE NEGATIVE Final   Influenza B by PCR NEGATIVE NEGATIVE Final    Comment: (NOTE) The Xpert Xpress SARS-CoV-2/FLU/RSV plus assay is intended as an aid in the diagnosis of influenza from Nasopharyngeal swab specimens and should not be used as a sole basis for treatment. Nasal washings and aspirates are unacceptable for Xpert Xpress SARS-CoV-2/FLU/RSV testing.  Fact Sheet for Patients: EntrepreneurPulse.com.au  Fact Sheet for Healthcare Providers: IncredibleEmployment.be  This test is not yet approved or cleared by the [place] FDA and has been authorized for detection and/or diagnosis of SARS-CoV-2 by FDA under an Emergency Use Authorization (EUA). This EUA will remain in effect (meaning this test can be used) for the duration of the COVID-19 declaration under Section 564(b)(1) of the Act, 21 U.S.C. section 360bbb-3(b)(1), unless the authorization is terminated or revoked.  Performed at [facility], [address] [place], [place] [address]     Labs: CBC: Recent Labs  Lab 09/21/22 2229  09/22/22 1211 09/23/22 0445  WBC 7.9 5.9 7.1  NEUTROABS  --  1.9  --   HGB 13.7 11.1* 11.7*  HCT 40.0 32.8* 34.3*  MCV 92.4 94.0 93.0  PLT 243 175 876   Basic Metabolic Panel: Recent Labs  Lab 09/21/22 2229 09/22/22 1211 09/23/22 0450  NA 139 140 137  K 3.5 3.1* 3.0*  CL 111 112* 112*  CO2 '23 25 22  '$ GLUCOSE 115* 84 129*  BUN '15 10 13  '$ CREATININE 0.94 0.62 0.53  CALCIUM 9.4 8.5* 8.4*   Liver Function Tests: Recent Labs  Lab 09/21/22 2229 09/22/22 1211 09/23/22 0450  AST 17 14* 17  ALT '13 10 12  '$ ALKPHOS 70 47 48  BILITOT 0.7 0.8 0.6  PROT 7.6 5.7* 5.8*  ALBUMIN 4.3 3.1* 3.1*   CBG: No results for input(s):  "GLUCAP" in the last 168 hours.  Discharge time spent: less than 30 minutes.  Signed: Alma Friendly, MD [facility] 09/23/2022

## 2022-09-25 ENCOUNTER — Ambulatory Visit (HOSPITAL_BASED_OUTPATIENT_CLINIC_OR_DEPARTMENT_OTHER): Payer: Medicaid Other | Admitting: Physical Therapy

## 2022-09-26 ENCOUNTER — Ambulatory Visit: Payer: Self-pay | Admitting: *Deleted

## 2022-09-26 NOTE — Telephone Encounter (Signed)
Reason for Disposition . [1] Caller has URGENT medicine question about med that PCP or specialist prescribed AND [2] triager unable to answer question  Answer Assessment - Initial Assessment Questions 1. 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   I have a connective tissue disease.    My GI dr. Doristine Section me on an antidepressant Elavil to calm the nerves.   I took it 2 days and now I'm having hives and severe itching.   My tongue lips and mouth and swelling and twitching in my mouth.    I didn't know what to do.     2. QUESTION: "What is your question?" (e.g., double dose of medicine, side effect)     It's been 2 days so I'm taking Benadryl.   I'm still itching and I have rash too.  I stopped the Elavil 2 days ago.  The tingling and twitching is gone.   That's the kind of reaction I've had in the past.  3. PRESCRIBER: "Who prescribed the medicine?" Reason: if prescribed by specialist, call should be referred to that group.     Her GI dr.     4. SYMPTOMS: "Do you have any symptoms?" If Yes, ask: "What symptoms are you having?"  "How bad are the symptoms (e.g., mild, moderate, severe)     I'm taking the Benadryl every 6 hours so my symptoms are going away.  The swelling, tingling and twitching of my mouth has stopped.   She denies shortness of breath or trouble breathing. I just wanted to let Dr. Margarita Rana know about the reaction to the Elavil.  I'm going to contact my GI dr to get further guidance on what I should do in regards to the medication and my gut which is why the GI dr put me on the Elavil.     I have sent all this information to Dr. Margarita Rana with [facility].   [facility] is trying to get her worked in for a hospital f/u appt.   They will be calling her back.  5. PREGNANCY:  "Is there any chance that you are pregnant?" "When was your last menstrual period?"     Not asked  Protocols used: Medication Question  [facility]

## 2022-09-28 ENCOUNTER — Ambulatory Visit (INDEPENDENT_AMBULATORY_CARE_PROVIDER_SITE_OTHER): Payer: Medicaid Other | Admitting: Clinical

## 2022-09-28 DIAGNOSIS — F909 Attention-deficit hyperactivity disorder, unspecified type: Secondary | ICD-10-CM | POA: Diagnosis not present

## 2022-09-28 DIAGNOSIS — Z658 Other specified problems related to psychosocial circumstances: Secondary | ICD-10-CM

## 2022-09-28 DIAGNOSIS — F4310 Post-traumatic stress disorder, unspecified: Secondary | ICD-10-CM

## 2022-10-02 NOTE — BH Specialist Note (Addendum)
Integrated Behavioral Health via Telemedicine Visit  10/16/2022 Rachel [name] [id number]  Number of [place] Clinician visits: Additional Visit  Session Start time: 4008   Session End time: 1121  Total time in minutes: 15   Referring Provider: Lynnda Shields, MD Patient/Family location: Home [facility] Provider location: [facility] [place] [facility]  All persons participating in visit: [name] and Rachel [name]    Types of Service: Individual psychotherapy and Telephone visit  I connected with Rachel [name] [name]'s  n/a  via  Telephone or Video Enabled Telemedicine Application  (Video is Caregility application) and verified that I am speaking with the correct person using two identifiers. Discussed confidentiality: Yes   I discussed the limitations of telemedicine and the availability of in person appointments.  Discussed there is a possibility of technology failure and discussed alternative modes of communication if that failure occurs.  I discussed that engaging in this telemedicine visit, they consent to the provision of behavioral healthcare and the services will be billed under their insurance.  Patient and/or legal guardian expressed understanding and consented to Telemedicine visit: Yes   Presenting Concerns: Patient and/or family reports the following symptoms/concerns: Processing feelings regarding "health scares" that began early October, with episodes of "brain fog, itchiness, feeling detached/disociation" and feeling unable to move for up to an hour, similar to sleep paralysis, but while fully awake and aware; pt attributes symptoms to possible unusual allergic reaction to medication or something else unknown at this time. Pt mostly sleeps and works on her art to cope; looking forward to one year anniversary with boyfriend.  Duration of problem:  Ongoing with increase in unusual symptoms over two months; Severity of problem: moderate  Patient and/or Family's Strengths/Protective Factors: Social connections, Social and Emotional competence, Concrete supports in place (healthy food, safe environments, etc.), Sense of purpose, and Physical Health (exercise, healthy diet, medication compliance, etc.)  Goals Addressed: Patient will:  Reduce symptoms of: anxiety and depression   Increase knowledge and/or ability of: stress reduction   Demonstrate ability to: Increase healthy adjustment to current life circumstances  Progress towards Goals: Ongoing  Interventions: Interventions utilized:  ACT (Acceptance and Commitment Therapy) Standardized Assessments completed: Not Needed  Patient and/or Family Response: Patient agrees with treatment plan.   Assessment: Patient currently experiencing PTSD, ADHD, Psychosocial stress.   Patient may benefit from continued therapeutic interventions.  Plan: Follow up with behavioral health clinician on : Two weeks Behavioral recommendations:  -Continue using daily self-coping strategies to manage emotional and physical health/wellness -Continue setting healthy boundaries and open communication with partner -Continue plan to attend upcoming PCP appointment; discuss appropriate referrals (possibly to neurology and allergist) Referral(s): [place] (In Clinic)  I discussed the assessment and treatment plan with the patient and/or parent/guardian. They were provided an opportunity to ask questions and all were answered. They agreed with the plan and demonstrated an understanding of the instructions.   They were advised to call back or seek an in-person evaluation if the symptoms worsen or if the condition fails to improve as anticipated.  Caroleen Hamman Valrie Jia, LCSW

## 2022-10-12 ENCOUNTER — Telehealth: Payer: Self-pay

## 2022-10-12 NOTE — Telephone Encounter (Addendum)
-----   Message from Thornton Park, MD sent at 10/03/2022 10:05 PM EST ----- Regarding: FW: Gastric Volvulus  Patient of Dr. Woodward Ku who was recently in the hospital. We discussed a referral to [facility] - ideally to see Dr. Dwaine Deter. Please process the referral.  She also needs office follow-up with APP or Dr. Silverio Decamp in 3-4 weeks.   Thanks.  KLB ----- Message ----- From: Loralie Champagne, PA-C Sent: 09/25/2022   9:00 AM EST To: Thornton Park, MD Subject: Gastric Volvulus                               This is her.

## 2022-10-16 ENCOUNTER — Ambulatory Visit (INDEPENDENT_AMBULATORY_CARE_PROVIDER_SITE_OTHER): Payer: Medicaid Other | Admitting: Clinical

## 2022-10-16 DIAGNOSIS — Z658 Other specified problems related to psychosocial circumstances: Secondary | ICD-10-CM

## 2022-10-16 DIAGNOSIS — F909 Attention-deficit hyperactivity disorder, unspecified type: Secondary | ICD-10-CM

## 2022-10-16 DIAGNOSIS — F4310 Post-traumatic stress disorder, unspecified: Secondary | ICD-10-CM

## 2022-10-17 NOTE — BH Specialist Note (Addendum)
Integrated Behavioral Health via Telemedicine Visit  10/17/2022 Rachel [name] [id number]  Number of [place] Clinician visits: Additional Visit  Session Start time: 1448   Session End time: 1121  Total time in minutes: 60   Referring Provider: Lynnda Shields, MD Patient/Family location: Home [facility] Provider location: [facility] [place] [facility]  All persons participating in visit: [name] and Rachel [name]   Types of Service: Individual psychotherapy and Telephone visit  I connected with Rachel [name] [name]'s  n/a  via  Telephone or Video Enabled Telemedicine Application  (Video is Caregility application) and verified that I am speaking with the correct person using two identifiers. Discussed confidentiality: Yes   I discussed the limitations of telemedicine and the availability of in person appointments.  Discussed there is a possibility of technology failure and discussed alternative modes of communication if that failure occurs.  I discussed that engaging in this telemedicine visit, they consent to the provision of behavioral healthcare and the services will be billed under their insurance.  Patient and/or legal guardian expressed understanding and consented to Telemedicine visit: Yes   Presenting Concerns: Patient and/or family reports the following symptoms/concerns:Looking back over changes in the past year with health and personal life; feeling contentment and gratitude today and hopeful about the future.  Duration of problem: Ongoing; Severity of problem: mild  Patient and/or Family's Strengths/Protective Factors: Social connections, Social and Emotional competence, Concrete supports in place (healthy food, safe environments, etc.), Sense of purpose, and Physical Health (exercise, healthy diet, medication compliance, etc.)  Goals  Addressed: Patient will:  Maintain reduction of symptoms of: anxiety and depression    Progress towards Goals: Ongoing  Interventions: Interventions utilized:  ACT (Acceptance and Commitment Therapy) Standardized Assessments completed: Not Needed  Patient and/or Family Response: Patient agrees with treatment plan.    Assessment: Patient currently experiencing PTSD, ADHD, Psychosocial stress.   Patient may benefit from continued therapeutic interventions.  Plan: Follow up with behavioral health clinician on : Two weeks Behavioral recommendations:  -Continue using daily self-coping strategies(including healthy boundaries; focusing on creating art and maintaining personal space) for optimal emotional and physical wellness -Continue plan to enjoy time with family, friends and partner over the holidays  Referral(s): [place] (In Clinic)  I discussed the assessment and treatment plan with the patient and/or parent/guardian. They were provided an opportunity to ask questions and all were answered. They agreed with the plan and demonstrated an understanding of the instructions.   They were advised to call back or seek an in-person evaluation if the symptoms worsen or if the condition fails to improve as anticipated.  Rachel Hamman Owyn Raulston, LCSW

## 2022-10-18 NOTE — Telephone Encounter (Signed)
Dr. Dwaine Deter has declined the referral. Per Dr Silverio Decamp, the patient will be referred to [facility].

## 2022-10-18 NOTE — Telephone Encounter (Signed)
Ok, thanks.

## 2022-10-20 ENCOUNTER — Ambulatory Visit
Admission: EM | Admit: 2022-10-20 | Discharge: 2022-10-20 | Disposition: A | Discharge status: Home / Self Care | Payer: Medicaid Other | Attending: Family Medicine | Admitting: Family Medicine

## 2022-10-20 ENCOUNTER — Encounter: Payer: Self-pay | Admitting: Emergency Medicine

## 2022-10-20 DIAGNOSIS — S0501XA Injury of conjunctiva and corneal abrasion without foreign body, right eye, initial encounter: Secondary | ICD-10-CM | POA: Diagnosis not present

## 2022-10-20 MED ORDER — SULFACETAMIDE SODIUM 10 % OP SOLN: 1.0000 [drp] | OPHTHALMIC | 0 refills | Status: DC

## 2022-10-20 MED ORDER — KETOROLAC TROMETHAMINE 0.5 % OP SOLN: 1.0000 [drp] | Freq: Four times a day (QID) | OPHTHALMIC | 0 refills | Status: DC

## 2022-10-20 MED ORDER — ACETAMINOPHEN 500 MG PO TABS
1000.0000 mg | ORAL_TABLET | Freq: Once | ORAL | Status: AC
  Administered 2022-10-20: 1000 mg via ORAL

## 2022-10-20 NOTE — Discharge Instructions (Addendum)
May apply a cold compress several times daily.  If symptoms become significantly worse during the night or over the weekend, proceed to the local emergency room.

## 2022-10-20 NOTE — ED Triage Notes (Signed)
Patient c/o possible foreign in right eye x 1 day.  Patient unsure if it's an eyelash or hair in eye.  Patient did try flushing her eye with saline and applying cold compresses.  Patient is sensitive to light.  Patient does wear glasses.

## 2022-10-20 NOTE — ED Provider Notes (Signed)
[name]    CSN: [id number] Arrival date & time: 10/20/22  1033      History   Chief Complaint Chief Complaint  Patient presents with   Foreign Body in Eye    HPI Rachel [name] is a 36 y.o. female.   Patient developed sudden foreign body sensation and light sensitivity in her right eye yesterday, although she does not recall any foreign body or eye injury.  She tried flushing her eye with saline and applying cold compresses without improvement.  She wears glasses but not contact lens.  The history is provided by the patient and a parent.  Eye Pain This is a new problem. The current episode started yesterday. The problem occurs constantly. The problem has not changed since onset.Associated symptoms comments: photophobia. Exacerbated by: blinking. Nothing relieves the symptoms. Treatments tried: saline rinse and cold compresses. The treatment provided no relief.    Past Medical History:  Diagnosis Date   ADHD (attention deficit hyperactivity disorder)    Anemia    Anxiety    Asthma    Bipolar disorder ([place])    Complication of anesthesia    slow to wake up    Delayed gastric emptying 02/23/2020   Depression    DUB (dysfunctional uterine bleeding) 02/23/2020   Ehlers-Danlos syndrome    Endometriosis    Gastroparesis    GERD (gastroesophageal reflux disease)    Heart murmur    Hiatal hernia 04/06/2020   Myalgia 04/06/2020   Parasitic infection 02/23/2020   Pneumonia    Poor appetite 02/23/2020   RUQ pain 02/23/2020   Vitamin D deficiency    Weight loss 02/23/2020    Patient Active Problem List   Diagnosis Date Noted   Abnormal CT of the abdomen 09/23/2022   Abdominal pain 09/22/2022   Asthma, chronic 09/22/2022   HLD (hyperlipidemia) 09/22/2022   Nausea & vomiting 09/22/2022   Iron deficiency anemia 01/20/2022   Acute hyponatremia 01/18/2022   Pyelonephritis 01/18/2022   Protein-calorie malnutrition, severe 01/18/2022   sepsis  secondary to pyelonephritis of left kidney 01/17/2022   Sepsis ([place]) 01/17/2022   Chronic abdominal pain 01/17/2022   Dyspnea 01/17/2022   Underweight 01/17/2022   Irritable bowel syndrome (IBS) 01/17/2022   Hypokalemia 01/17/2022   Vasomotor symptoms due to menopause 08/17/2021   S/P laparoscopic hysterectomy 07/12/2021   Preoperative exam for gynecologic surgery 06/20/2021   Adverse drug reaction 06/01/2021   Draining postoperative wound 02/28/2021   Encounter for postoperative care 02/16/2021   Endometriosis determined by laparoscopy    Pelvic pain in female 10/11/2020   Presence of subdermal contraceptive implant 10/11/2020   Dysmenorrhea 10/11/2020   Dyspareunia in female 10/11/2020   NSVT (nonsustained ventricular tachycardia) ([place]) 09/03/2020   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   Palpitations 07/26/2020   Family history of connective tissue disease 07/26/2020   Myalgia 04/06/2020   Hiatal hernia 04/06/2020   Parasitic infection 02/23/2020   Weight loss 02/23/2020   Poor appetite 02/23/2020   RUQ pain 02/23/2020   Delayed gastric emptying 02/23/2020   DUB (dysfunctional uterine bleeding) 02/23/2020   Cervical radiculopathy 05/31/2018   Hyperalgesia 05/31/2018   GERD (gastroesophageal reflux disease) 12/26/2017   ADHD (attention deficit hyperactivity disorder) 05/07/2012   Bipolar 1 disorder ([place]) 05/07/2012    Past Surgical History:  Procedure Laterality Date   barium study     COLONOSCOPY     CYSTOSCOPY N/A 07/12/2021   Procedure: CYSTOSCOPY;  Surgeon: [name], MD;  Location: [place] [facility];  Service: Gynecology;  Laterality: N/A;   ESOPHAGOGASTRODUODENOSCOPY  2011   and a ph study as well.    LAPAROSCOPY N/A 01/26/2021   Procedure: LAPAROSCOPY DIAGNOSTIC;  Surgeon: [name], MD;  Location: [place];  Service: Gynecology;  Laterality: N/A;   NASAL SEPTUM SURGERY     TONGUE FLAP     TOTAL LAPAROSCOPIC HYSTERECTOMY WITH SALPINGECTOMY  Bilateral 07/12/2021   Procedure: TOTAL LAPAROSCOPIC HYSTERECTOMY WITH SALPINGECTOMY, OOPHORECTOMY;  Surgeon: [name], MD;  Location: [place];  Service: Gynecology;  Laterality: Bilateral;   UPPER GASTROINTESTINAL ENDOSCOPY     WISDOM TOOTH EXTRACTION      OB History     Gravida  0   Para  0   Term  0   Preterm  0   AB  0   Living  0      SAB  0   IAB  0   Ectopic  0   Multiple  0   Live Births  0            Home Medications    Prior to Admission medications   Medication Sig Start Date End Date Taking? Authorizing Provider  albuterol (PROVENTIL) (2.5 MG/3ML) 0.083% nebulizer solution Take 3 mLs (2.5 mg total) by nebulization every 6 (six) hours as needed for wheezing or shortness of breath. Max 4 doses per day 07/26/22  Yes Petrucelli, Samantha R, PA-C  albuterol (VENTOLIN HFA) 108 (90 Base) MCG/ACT inhaler Inhale 1-2 puffs into the lungs every 6 (six) hours as needed for wheezing or shortness of breath. 12/16/21  Yes Kandra Nicolas, MD  Ascorbic Acid (VITAMIN C) 1000 MG tablet Take 1,000 mg by mouth 2 (two) times daily.   Yes [provider]  atorvastatin (LIPITOR) 20 MG tablet Take 1 tablet (20 mg total) by mouth daily. 08/02/22  Yes Charlott Rakes, MD  B Complex Vitamins (B COMPLEX PO) Take 1 capsule by mouth daily.   Yes [provider]  Calcium-Magnesium-Vitamin D 600-40-500 MG-MG-UNIT TB24 Take 3 tablets by mouth daily.   Yes [provider]  Cholecalciferol (VITAMIN D3) 50 MCG (2000 UT) TABS Take 2,000 Units by mouth daily.   Yes [provider]  ELDERBERRY PO Take 1 tablet by mouth daily.   Yes [provider]  EPINEPHrine 0.3 mg/0.3 mL IJ SOAJ injection Inject 0.3 mg into the muscle as needed for anaphylaxis. 05/27/21  Yes Chandrasekar, Additya, MD  estrogens, conjugated, (PREMARIN) 0.625 MG tablet Take 1 tablet (0.625 mg total) by mouth daily. Take daily for 21 days then do not take for 7 days. 03/24/22  03/24/23 Yes [name], MD  fexofenadine (ALLEGRA) 180 MG tablet Take 180 mg by mouth daily.   Yes [provider]  Iron, Ferrous Sulfate, 325 (65 Fe) MG TABS Take 325 mg by mouth daily. 08/02/22  Yes Charlott Rakes, MD  ketorolac (ACULAR) 0.5 % ophthalmic solution Place 1 drop into the right eye 4 (four) times daily. 10/20/22  Yes Kandra Nicolas, MD  LINZESS 290 MCG CAPS capsule TAKE ONE CAPSULE BY MOUTH EVERY MORNING BEFORE BREAKFAST Patient taking differently: Take 290 mcg by mouth daily before breakfast. 07/25/22  Yes Nandigam, Venia Minks, MD  Melatonin 10 MG TABS Take 10 mg by mouth at bedtime.   Yes [provider]  MIRALAX 17 GM/SCOOP powder Take 17 g by mouth See admin instructions. Mix 17 g of powder into water and drink once a day   Yes  [provider]  Multiple Vitamins-Minerals (ADULT ONE DAILY GUMMIES PO) Take 2 tablets by mouth daily.   Yes [provider]  Omega-3 Fatty Acids (FISH OIL) 1200 MG CAPS Take 1,200 mg by mouth daily.   Yes [provider]  potassium chloride SA (KLOR-CON M) 20 MEQ tablet Take 1 tablet (20 mEq total) by mouth 2 (two) times daily. 07/26/22  Yes Petrucelli, Samantha R, PA-C  promethazine (PHENERGAN) 12.5 MG tablet Take 12.5 mg by mouth See admin instructions. Take 12.5 mg by mouth every 6-8 hours as needed for nausea   Yes [provider]  sennosides-docusate sodium (SENOKOT-S) 8.6-50 MG tablet Take 1-2 tablets by mouth daily as needed for constipation.   Yes [provider]  sulfacetamide (BLEPH-10) 10 % ophthalmic solution Place 1-2 drops into the right eye every 3 (three) hours. Use while awake 10/20/22  Yes Kandra Nicolas, MD  triamcinolone cream (KENALOG) 0.1 % APPLY TO AFFECTED AREA(S) TWO TIMES A DAY Patient taking differently: Apply 1 Application topically 2 (two) times daily as needed (rash, itching). 08/22/22  Yes Charlott Rakes, MD  VITAMIN A PO Take 1 capsule by mouth daily.   Yes  [provider]  amitriptyline (ELAVIL) 25 MG tablet Take 1 tablet (25 mg total) by mouth at bedtime. 09/23/22 10/23/22  Alma Friendly, MD    Family History Family History  Problem Relation Age of Onset   Colon polyps Mother    Depression Mother    Anxiety disorder Mother    Hypertension Mother    Hyperlipidemia Mother    Asthma Mother    Endometriosis Mother    Fibroids Mother    Fibromyalgia Mother    Migraines Mother    Irritable bowel syndrome Mother    Depression Father    Hypertension Father    Hyperlipidemia Father    Bipolar disorder Father    Bipolar disorder Sister    ADD / ADHD Sister    Asthma Sister    Endometriosis Sister    Seizures Sister    Bipolar disorder Sister    Anxiety disorder Sister    Heart disease Maternal Grandmother    Endometriosis Maternal Grandmother    Fibroids Maternal Grandmother    Hyperlipidemia Maternal Grandmother    Hypertension Maternal Grandmother    Diabetes Maternal Grandmother    Fibromyalgia Maternal Grandmother    Heart disease Maternal Grandfather    Diabetes Maternal Grandfather    Heart disease Paternal Grandmother    Heart disease Paternal Grandfather    Colon cancer Maternal Great-grandmother    Pancreatic cancer Neg Hx    Esophageal cancer Neg Hx    Stomach cancer Neg Hx     Social History Social History   Tobacco Use   Smoking status: Never   Smokeless tobacco: Never  Vaping Use   Vaping Use: Never used  Substance Use Topics   Alcohol use: Yes    Alcohol/week: 1.0 standard drink of alcohol    Types: 1 Glasses of wine per week    Comment: 1 glass of wine per week   Drug use: No     Allergies   Amitriptyline, Amphetamine-dextroamphetamine, Lupron [leuprolide], Orilissa [elagolix], Penicillins, Chlorhexidine, Doxycycline, Mixed ragweed, Olanzapine, Other, Prozac [fluoxetine hcl], Stevia glycerite extract [flavoring agent], Tape, Triple antibiotic pain relief  [neomy-bacit-polymyx-pramoxine], and Reglan [metoclopramide]   Review of Systems Review of Systems  Constitutional:  Positive for activity change. Negative for appetite change, chills, diaphoresis, fatigue and fever.  HENT: Negative.  Eyes:  Positive for photophobia, pain and redness. Negative for discharge, itching and visual disturbance.  All other systems reviewed and are negative.    Physical Exam Triage Vital Signs ED Triage Vitals  Enc Vitals Group     BP 10/20/22 1104 121/83     Pulse Rate 10/20/22 1104 67     Resp 10/20/22 1104 18     Temp 10/20/22 1104 98.6 F (37 C)     Temp Source 10/20/22 1104 Oral     SpO2 10/20/22 1104 100 %     Weight 10/20/22 1106 108 lb (49 kg)     Height 10/20/22 1106 5' 2.5" (1.588 m)     Head Circumference --      Peak Flow --      Pain Score 10/20/22 1106 6     Pain Loc --      Pain Edu? --      Excl. in [place]? --    No data found.  Updated Vital Signs BP 121/83 (BP Location: Left Arm)   Pulse 67   Temp 98.6 F (37 C) (Oral)   Resp 18   Ht 5' 2.5" (1.588 m)   Wt 49 kg   LMP  (LMP Unknown)   SpO2 100%   BMI 19.44 kg/m   Visual Acuity Right Eye Distance:   Left Eye Distance:   Bilateral Distance:    Right Eye Near:   Left Eye Near:    Bilateral Near:     Physical Exam Vitals and nursing note reviewed.  Constitutional:      General: She is not in acute distress. HENT:     Head: Normocephalic.     Right Ear: External ear normal.     Left Ear: External ear normal.     Nose: Nose normal.     Mouth/Throat:     Mouth: Mucous membranes are moist.  Eyes:     General: Lids are normal. Lids are everted, no foreign bodies appreciated. Vision grossly intact.        Right eye: No foreign body, discharge or hordeolum.     Extraocular Movements: Extraocular movements intact.     Conjunctiva/sclera:     Right eye: Right conjunctiva is injected. No chemosis.    Pupils: Pupils are equal, round, and reactive to light.       Comments: Fundi benign.  Mild photophobia present. After instilling 1 drop tetracaine ophthalmic suspension to the right eye, no foreign bodies seen. Fluorescein shows uptake in two tiny superficial abrasions over the inferior cornea as noted on diagram.    Cardiovascular:     Rate and Rhythm: Normal rate.  Pulmonary:     Effort: Pulmonary effort is normal.  Musculoskeletal:     Cervical back: Neck supple.  Skin:    General: Skin is warm and dry.  Neurological:     Mental Status: She is alert and oriented to person, place, and time.      UC Treatments / Results  Labs (all labs ordered are listed, but only abnormal results are displayed) Labs Reviewed - No data to display  EKG   Radiology No results found.  Procedures Procedures (including critical care time)  Medications Ordered in UC Medications  acetaminophen (TYLENOL) tablet 1,000 mg (has no administration in time range)    Initial Impression / Assessment and Plan / UC Course  I have reviewed the triage vital signs and the nursing notes.  Pertinent labs & imaging results that were available  during my care of the patient were reviewed by me and considered in my medical decision making (see chart for details).    Begin sulfacetamide and Acular drops. Followuo with ophthalmologist if not improved in 3 days.  Final Clinical Impressions(s) / UC Diagnoses   Final diagnoses:  Right corneal abrasion, initial encounter     Discharge Instructions      May apply a cold compress several times daily.  If symptoms become significantly worse during the night or over the weekend, proceed to the local emergency room.     ED Prescriptions     Medication Sig Dispense Auth. Provider   sulfacetamide (BLEPH-10) 10 % ophthalmic solution Place 1-2 drops into the right eye every 3 (three) hours. Use while awake 5 mL Kandra Nicolas, MD   ketorolac (ACULAR) 0.5 % ophthalmic solution Place 1 drop into the right eye 4 (four)  times daily. 3 mL Kandra Nicolas, MD         Kandra Nicolas, MD 10/22/22 [phone]

## 2022-10-26 ENCOUNTER — Other Ambulatory Visit: Payer: Self-pay | Admitting: Family Medicine

## 2022-10-26 DIAGNOSIS — I700 Atherosclerosis of aorta: Secondary | ICD-10-CM

## 2022-10-27 NOTE — Telephone Encounter (Signed)
Requested Prescriptions  Pending Prescriptions Disp Refills   atorvastatin (LIPITOR) 20 MG tablet [Pharmacy Med Name: ATORVASTATIN 20 MG TABLET] 90 tablet 0    Sig: TAKE 1 TABLET BY MOUTH DAILY     Cardiovascular:  Antilipid - Statins Failed - 10/26/2022  6:03 PM      Failed - Lipid Panel in normal range within the last 12 months    Cholesterol, Total  Date Value Ref Range Status  08/08/2022 228 (H) 100 - 199 mg/dL Final   LDL Chol Calc (NIH)  Date Value Ref Range Status  08/08/2022 132 (H) 0 - 99 mg/dL Final   HDL  Date Value Ref Range Status  08/08/2022 75 >39 mg/dL Final   Triglycerides  Date Value Ref Range Status  08/08/2022 120 0 - 149 mg/dL Final         Passed - Patient is not pregnant      Passed - Valid encounter within last 12 months    Recent Outpatient Visits           2 months ago Aortic atherosclerosis ([place])   [place], Charlane Ferretti, MD   5 months ago Recurrent pyelonephritis   [place], Chambers, MD   9 months ago Irritable bowel syndrome with both constipation and diarrhea   [place], Enobong, MD   1 year ago Anaphylaxis, sequela   [facility] Dorna Mai, MD   2 years ago [place], MD       Future Appointments             In 6 days Charlott Rakes, MD [place]

## 2022-10-30 ENCOUNTER — Ambulatory Visit (INDEPENDENT_AMBULATORY_CARE_PROVIDER_SITE_OTHER): Payer: Medicaid Other | Admitting: Clinical

## 2022-10-30 DIAGNOSIS — Z658 Other specified problems related to psychosocial circumstances: Secondary | ICD-10-CM

## 2022-10-30 DIAGNOSIS — F909 Attention-deficit hyperactivity disorder, unspecified type: Secondary | ICD-10-CM

## 2022-10-30 DIAGNOSIS — F4310 Post-traumatic stress disorder, unspecified: Secondary | ICD-10-CM

## 2022-11-02 ENCOUNTER — Ambulatory Visit
Admission: RE | Admit: 2022-11-02 | Discharge: 2022-11-02 | Disposition: A | Discharge status: Home / Self Care | Payer: Medicaid Other | Source: Ambulatory Visit | Attending: Family Medicine | Admitting: Family Medicine

## 2022-11-02 ENCOUNTER — Ambulatory Visit: Payer: Medicaid Other | Attending: Family Medicine | Admitting: Family Medicine

## 2022-11-02 VITALS — BP 114/77 | HR 65 | Temp 97.9°F | Ht 63.0 in | Wt 115.0 lb

## 2022-11-02 DIAGNOSIS — Z889 Allergy status to unspecified drugs, medicaments and biological substances status: Secondary | ICD-10-CM | POA: Diagnosis not present

## 2022-11-02 DIAGNOSIS — R0789 Other chest pain: Secondary | ICD-10-CM | POA: Diagnosis not present

## 2022-11-02 DIAGNOSIS — R109 Unspecified abdominal pain: Secondary | ICD-10-CM | POA: Diagnosis not present

## 2022-11-02 DIAGNOSIS — G8929 Other chronic pain: Secondary | ICD-10-CM | POA: Diagnosis not present

## 2022-11-02 DIAGNOSIS — R4189 Other symptoms and signs involving cognitive functions and awareness: Secondary | ICD-10-CM | POA: Diagnosis not present

## 2022-11-02 DIAGNOSIS — E876 Hypokalemia: Secondary | ICD-10-CM | POA: Diagnosis not present

## 2022-11-02 DIAGNOSIS — N12 Tubulo-interstitial nephritis, not specified as acute or chronic: Secondary | ICD-10-CM | POA: Diagnosis not present

## 2022-11-02 NOTE — Progress Notes (Signed)
Subjective:  Patient ID: Rachel [name], female    DOB: January 05, 1986  Age: 36 y.o. MRN: [id number]  CC: Hospitalization Follow-up   HPI Rachel [name] is a 36 y.o. year old female with a history of bipolar disorder, anxiety, depression, GERD, IBS with constipation, status post laparoscopic TAHBSO due to abnormal uterine bleed currently on Premarin by GYN)    Interval History: 2 weeks ago she was seen at the ED for corneal abrasion which was treated with optic drops. Prior to that she had an ED visit last month for abdominal pain, nausea and diarrhea. Upper GI studies revealed: IMPRESSION: 1. No gastric volvulus or obstruction. 2. Atypical orientation of the stomach related to elevation of the LEFT hemidiaphragm. 3. Normal duodenum. 4. No hiatal hernia.  CT abdomen and pelvis revealed: IMPRESSION: Possible gastric volvulus. Upper GI may be beneficial for further evaluation if clinically warranted. No evidence of ischemia.   Wall thickening about the stomach may be due to underdistention or gastritis.   Otherwise no acute abnormality in the abdomen or pelvis.   She has an upcoming appointment with Gen Surgery later this month. She has had sharp pains, early satiety, unable to vomit, swelling, blotaing, reduced appetite which occurs intermittently and she feels her volvulus twist and twist.  She would like to see a Neuroogist as for the last 2 mos she has had moments when she is 'physically paralyzed' and her brain is hyperactive but her body can't move; feels like she has a disconnect. She can't tell her body to something and she sometimes slurres her words. Mom has noticed her repeating herself and she forgets she has had a conversation.  She would like allergy testing as she thinks she might have environmental allergies as she also had an allergic reaction to Elavil.  She has had bad experience with febrile medication in the past.  Past Medical History:   Diagnosis Date   ADHD (attention deficit hyperactivity disorder)    Anemia    Anxiety    Asthma    Bipolar disorder ([place])    Complication of anesthesia    slow to wake up    Delayed gastric emptying 02/23/2020   Depression    DUB (dysfunctional uterine bleeding) 02/23/2020   Ehlers-Danlos syndrome    Endometriosis    Gastroparesis    GERD (gastroesophageal reflux disease)    Heart murmur    Hiatal hernia 04/06/2020   Myalgia 04/06/2020   Parasitic infection 02/23/2020   Pneumonia    Poor appetite 02/23/2020   RUQ pain 02/23/2020   Vitamin D deficiency    Weight loss 02/23/2020    Past Surgical History:  Procedure Laterality Date   barium study     COLONOSCOPY     CYSTOSCOPY N/A 07/12/2021   Procedure: CYSTOSCOPY;  Surgeon: [name], MD;  Location: [facility];  Service: Gynecology;  Laterality: N/A;   ESOPHAGOGASTRODUODENOSCOPY  2011   and a ph study as well.    LAPAROSCOPY N/A 01/26/2021   Procedure: LAPAROSCOPY DIAGNOSTIC;  Surgeon: [name], MD;  Location: [facility];  Service: Gynecology;  Laterality: N/A;   NASAL SEPTUM SURGERY     TONGUE FLAP     TOTAL LAPAROSCOPIC HYSTERECTOMY WITH SALPINGECTOMY Bilateral 07/12/2021   Procedure: TOTAL LAPAROSCOPIC HYSTERECTOMY WITH SALPINGECTOMY, OOPHORECTOMY;  Surgeon: [name], MD;  Location: [facility];  Service: Gynecology;  Laterality: Bilateral;   UPPER GASTROINTESTINAL ENDOSCOPY     WISDOM TOOTH EXTRACTION  Family History  Problem Relation Age of Onset   Colon polyps Mother    Depression Mother    Anxiety disorder Mother    Hypertension Mother    Hyperlipidemia Mother    Asthma Mother    Endometriosis Mother    Fibroids Mother    Fibromyalgia Mother    Migraines Mother    Irritable bowel syndrome Mother    Depression Father    Hypertension Father    Hyperlipidemia Father    Bipolar disorder Father    Bipolar disorder Sister    ADD / ADHD Sister    Asthma Sister     Endometriosis Sister    Seizures Sister    Bipolar disorder Sister    Anxiety disorder Sister    Heart disease Maternal Grandmother    Endometriosis Maternal Grandmother    Fibroids Maternal Grandmother    Hyperlipidemia Maternal Grandmother    Hypertension Maternal Grandmother    Diabetes Maternal Grandmother    Fibromyalgia Maternal Grandmother    Heart disease Maternal Grandfather    Diabetes Maternal Grandfather    Heart disease Paternal Grandmother    Heart disease Paternal Grandfather    Colon cancer Maternal Great-grandmother    Pancreatic cancer Neg Hx    Esophageal cancer Neg Hx    Stomach cancer Neg Hx     Social History   Socioeconomic History   Marital status: Divorced    Spouse name: Not on file   Number of children: Not on file   Years of education: Not on file   Highest education level: Not on file  Occupational History   Not on file  Tobacco Use   Smoking status: Never   Smokeless tobacco: Never  Vaping Use   Vaping Use: Never used  Substance and Sexual Activity   Alcohol use: Yes    Alcohol/week: 1.0 standard drink of alcohol    Types: 1 Glasses of wine per week    Comment: 1 glass of wine per week   Drug use: No   Sexual activity: Not Currently  Other Topics Concern   Not on file  Social History Narrative   Not on file   Social Determinants of Health   Financial Resource Strain: Not on file  Food Insecurity: Food Insecurity Present (09/22/2022)   Hunger Vital Sign    Worried About Running Out of Food in the Last Year: Often true    Ran Out of Food in the Last Year: Often true  Transportation Needs: No Transportation Needs (09/22/2022)   PRAPARE - Hydrologist (Medical): No    Lack of Transportation (Non-Medical): No  Physical Activity: Not on file  Stress: Not on file  Social Connections: Not on file    Allergies  Allergen Reactions   Amitriptyline Anaphylaxis   Amphetamine-Dextroamphetamine Other (See  Comments)    Triggered bipolar, caused depression, and suicidal thoughts   Lupron [Leuprolide] Anaphylaxis   Orilissa [Elagolix] Anaphylaxis   Penicillins Anaphylaxis, Swelling and Other (See Comments)    Potential for anaphylaxis confirmed. Tolerates Cefphalosporins No "-cillins"!!!! Did it involve swelling of the face/tongue/throat, SOB, or low BP? Yes Did it involve sudden or severe rash/hives, skin peeling, or any reaction on the inside of your mouth or nose? Unknown Did you need to seek medical attention at a hospital or doctor's office? Unknown When did it last happen? teenager      If all above answers are "NO", may proceed with cephalosporin use.   Chlorhexidine  Hives    DuraPrep    Doxycycline Itching, Nausea And Vomiting and Other (See Comments)    Caused high fever, also   Mixed Ragweed Other (See Comments)    Unknown, Mother and pt state she can't have    Olanzapine Other (See Comments)    Caused mania   Other Other (See Comments)    Stimulants, mood stabilizers, and antidepressants = Triggered bipolar, caused depression, and suicidal thoughts   Prozac [Fluoxetine Hcl] Other (See Comments)    Triggered bipolar, caused depression, and suicidal thoughts   Stevia Glycerite Extract [Flavoring Agent] Other (See Comments)    Unknown reaction; mother and pt confirm any Artificial Sweeteners cause reaction   Tape Other (See Comments)    Paper tape is tolerated   Triple Antibiotic Pain Relief [Neomy-Bacit-Polymyx-Pramoxine] Hives   Reglan [Metoclopramide] Anxiety and Other (See Comments)    Triggered bipolar, caused depression, suicidal thoughts, muscle twitching, stiffness, and high anxiety     Outpatient Medications Prior to Visit  Medication Sig Dispense Refill   albuterol (PROVENTIL) (2.5 MG/3ML) 0.083% nebulizer solution Take 3 mLs (2.5 mg total) by nebulization every 6 (six) hours as needed for wheezing or shortness of breath. Max 4 doses per day 75 mL 0   albuterol  (VENTOLIN HFA) 108 (90 Base) MCG/ACT inhaler Inhale 1-2 puffs into the lungs every 6 (six) hours as needed for wheezing or shortness of breath. 8 g 1   Ascorbic Acid (VITAMIN C) 1000 MG tablet Take 1,000 mg by mouth 2 (two) times daily.     atorvastatin (LIPITOR) 20 MG tablet TAKE 1 TABLET BY MOUTH DAILY 90 tablet 0   B Complex Vitamins (B COMPLEX PO) Take 1 capsule by mouth daily.     Calcium-Magnesium-Vitamin D 600-40-500 MG-MG-UNIT TB24 Take 3 tablets by mouth daily.     Cholecalciferol (VITAMIN D3) 50 MCG (2000 UT) TABS Take 2,000 Units by mouth daily.     ELDERBERRY PO Take 1 tablet by mouth daily.     EPINEPHrine 0.3 mg/0.3 mL IJ SOAJ injection Inject 0.3 mg into the muscle as needed for anaphylaxis. 2 each 0   estrogens, conjugated, (PREMARIN) 0.625 MG tablet Take 1 tablet (0.625 mg total) by mouth daily. Take daily for 21 days then do not take for 7 days. 30 tablet 11   fexofenadine (ALLEGRA) 180 MG tablet Take 180 mg by mouth daily.     Iron, Ferrous Sulfate, 325 (65 Fe) MG TABS Take 325 mg by mouth daily. 60 tablet 2   ketorolac (ACULAR) 0.5 % ophthalmic solution Place 1 drop into the right eye 4 (four) times daily. 3 mL 0   LINZESS 290 MCG CAPS capsule TAKE ONE CAPSULE BY MOUTH EVERY MORNING BEFORE BREAKFAST (Patient taking differently: Take 290 mcg by mouth daily before breakfast.) 30 capsule 3   Melatonin 10 MG TABS Take 10 mg by mouth at bedtime.     MIRALAX 17 GM/SCOOP powder Take 17 g by mouth See admin instructions. Mix 17 g of powder into water and drink once a day     Multiple Vitamins-Minerals (ADULT ONE DAILY GUMMIES PO) Take 2 tablets by mouth daily.     Omega-3 Fatty Acids (FISH OIL) 1200 MG CAPS Take 1,200 mg by mouth daily.     promethazine (PHENERGAN) 12.5 MG tablet Take 12.5 mg by mouth See admin instructions. Take 12.5 mg by mouth every 6-8 hours as needed for nausea     sennosides-docusate sodium (SENOKOT-S) 8.6-50 MG tablet Take 1-2  tablets by mouth daily as needed for  constipation.     sulfacetamide (BLEPH-10) 10 % ophthalmic solution Place 1-2 drops into the right eye every 3 (three) hours. Use while awake 5 mL 0   triamcinolone cream (KENALOG) 0.1 % APPLY TO AFFECTED AREA(S) TWO TIMES A DAY (Patient taking differently: Apply 1 Application topically 2 (two) times daily as needed (rash, itching).) 45 g 1   VITAMIN A PO Take 1 capsule by mouth daily.     amitriptyline (ELAVIL) 25 MG tablet Take 1 tablet (25 mg total) by mouth at bedtime. 30 tablet 0   potassium chloride SA (KLOR-CON M) 20 MEQ tablet Take 1 tablet (20 mEq total) by mouth 2 (two) times daily. (Patient not taking: Reported on 11/02/2022) 3 tablet 0   No facility-administered medications prior to visit.     ROS Review of Systems  Constitutional:  Positive for appetite change. Negative for activity change and fatigue.  HENT:  Negative for congestion, sinus pressure and sore throat.   Eyes:  Negative for visual disturbance.  Respiratory:  Negative for cough, chest tightness, shortness of breath and wheezing.   Cardiovascular:  Negative for chest pain and palpitations.  Gastrointestinal:  Positive for abdominal pain and nausea. Negative for abdominal distention and constipation.  Endocrine: Negative for polydipsia.  Genitourinary:  Negative for dysuria and frequency.  Musculoskeletal:  Positive for arthralgias. Negative for back pain.  Skin:  Negative for rash.  Neurological:  Negative for tremors, light-headedness and numbness.  Hematological:  Does not bruise/bleed easily.  Psychiatric/Behavioral:  Negative for agitation and behavioral problems.     Objective:  BP 114/77   Pulse 65   Temp 97.9 F (36.6 C) (Oral)   Ht '5\' 3"'$  (1.6 m)   Wt 115 lb (52.2 kg)   LMP  (LMP Unknown)   SpO2 99%   BMI 20.37 kg/m      11/02/2022    9:18 AM 10/20/2022   11:06 AM 10/20/2022   11:04 AM  BP/Weight  Systolic BP 973  532  Diastolic BP 77  83  Wt. (Lbs) 115 108   BMI 20.37 kg/m2 19.44 kg/m2        Physical Exam Constitutional:      Appearance: She is well-developed.  Cardiovascular:     Rate and Rhythm: Normal rate.     Heart sounds: Normal heart sounds. No murmur heard. Pulmonary:     Effort: Pulmonary effort is normal.     Breath sounds: Normal breath sounds. No wheezing or rales.  Chest:     Chest wall: Tenderness (On 2nd left costochondral junction) present.  Abdominal:     General: Bowel sounds are normal. There is no distension.     Palpations: Abdomen is soft. There is no mass.     Tenderness: There is abdominal tenderness (generalized).  Musculoskeletal:        General: Normal range of motion.     Right lower leg: No edema.     Left lower leg: No edema.  Neurological:     Mental Status: She is alert and oriented to person, place, and time.  Psychiatric:        Mood and Affect: Mood normal.        Latest Ref Rng & Units 09/23/2022    4:50 AM 09/22/2022   12:11 PM 09/21/2022   10:29 PM  CMP  Glucose 70 - 99 mg/dL 129  84  115   BUN 6 - 20 mg/dL 13  10  15   Creatinine 0.44 - 1.00 mg/dL 0.53  0.62  0.94   Sodium 135 - 145 mmol/L 137  140  139   Potassium 3.5 - 5.1 mmol/L 3.0  3.1  3.5   Chloride 98 - 111 mmol/L 112  112  111   CO2 22 - 32 mmol/L '22  25  23   '$ Calcium 8.9 - 10.3 mg/dL 8.4  8.5  9.4   Total Protein 6.5 - 8.1 g/dL 5.8  5.7  7.6   Total Bilirubin 0.3 - 1.2 mg/dL 0.6  0.8  0.7   Alkaline Phos 38 - 126 U/L 48  47  70   AST 15 - 41 U/L '17  14  17   '$ ALT 0 - 44 U/L '12  10  13     '$ Lipid Panel     Component Value Date/Time   CHOL 228 (H) 08/08/2022 0840   TRIG 120 08/08/2022 0840   HDL 75 08/08/2022 0840   LDLCALC 132 (H) 08/08/2022 0840    CBC    Component Value Date/Time   WBC 7.1 09/23/2022 0445   RBC 3.69 (L) 09/23/2022 0445   HGB 11.7 (L) 09/23/2022 0445   HGB 13.0 06/28/2020 1600   HCT 34.3 (L) 09/23/2022 0445   HCT 40.1 06/28/2020 1600   PLT 179 09/23/2022 0445   PLT 310 06/28/2020 1600   MCV 93.0 09/23/2022 0445    MCV 96 06/28/2020 1600   MCH 31.7 09/23/2022 0445   MCHC 34.1 09/23/2022 0445   RDW 12.7 09/23/2022 0445   RDW 12.9 06/28/2020 1600   LYMPHSABS 3.0 09/22/2022 1211   LYMPHSABS 2.1 06/28/2020 1600   MONOABS 0.4 09/22/2022 1211   EOSABS 0.5 09/22/2022 1211   EOSABS 0.2 06/28/2020 1600   BASOSABS 0.1 09/22/2022 1211   BASOSABS 0.1 06/28/2020 1600    No results found for: "HGBA1C"  Assessment & Plan:  1. Brain fog This is associated with difficulty moving extremities, some form of short-term amnesia per mom Will refer to neurology per request for further evaluation - Ambulatory referral to Neurology  2. H/O multiple allergies She is wanting to have pharmacogenetics testing I have placed this order but [facility] was not able to draw this lab We are contacting the company with regards to procedure for testing and will notify her once we have been able to obtain this information - Ambulatory referral to [facility]; Future  3. Recurrent pyelonephritis She has had recurrent pyonephritis in the course of the year and was referred to urology in 04/2022 but never heard back from them I have provided her with the information for [facility] where she was referred so she can give them a call  4. Chronic abdominal pain Recent ED visit revealed a volvulus on one imaging and absence of volvulus and another imaging She has an upcoming appointment with general surgery to evaluate this  5. Chest wall pain Noticed on exam with reproducible chest pain Likely costochondritis Will send chest x-ray to exclude other pathology - DG Chest 2 View; Future  6. Hypokalemia Noticed on most recent labs Will check potassium level again. - Basic Metabolic Panel    No orders of the defined types were placed in this encounter.   Return in about 6 months (around 05/04/2023) for Chronic medical conditions.       Charlott Rakes, MD, FAAFP. [facility], [place]   11/02/2022, 9:31 AM

## 2022-11-02 NOTE — Progress Notes (Signed)
Ed visit from 09/21/2022

## 2022-11-02 NOTE — Patient Instructions (Signed)
[facility] ph. # [phone].Address [place] [address] floor.  They will contact the patient to schedule an appointment

## 2022-11-02 NOTE — BH Specialist Note (Addendum)
Integrated Behavioral Health via Telemedicine Visit  11/16/2022 Merlina Marchena [id number]  Number of [place] Clinician visits: Additional Visit  Session Start time: 1103   Session End time: 9702  Total time in minutes: 72   Referring Provider: Lynnda Shields, MD Patient/Family location: Home [facility] Provider location: [facility] [place] [facility]  All persons participating in visit: Patient Areatha Kalata and Kiana   Types of Service: Individual psychotherapy  I connected with [name] and/or Isabella Stalling Brickle's  n/a  via  Telephone or Video Enabled Telemedicine Application  (Video is Caregility application) and verified that I am speaking with the correct person using two identifiers. Discussed confidentiality: Yes   I discussed the limitations of telemedicine and the availability of in person appointments.  Discussed there is a possibility of technology failure and discussed alternative modes of communication if that failure occurs.  I discussed that engaging in this telemedicine visit, they consent to the provision of behavioral healthcare and the services will be billed under their insurance.  Patient and/or legal guardian expressed understanding and consented to Telemedicine visit: Yes   Presenting Concerns: Patient and/or family reports the following symptoms/concerns: Difficult holiday time remembering past traumas; moments of sadness, feeling as if "life has been derailed by job loss, past relationships and health; sometimes feeling disappointed in myself"; also feeling grateful for life itself in the midst of many health issues and hopeful for the future.  Duration of problem: Ongoing; Severity of problem: moderate  Patient and/or Family's Strengths/Protective Factors: Social connections, Social and Emotional competence, Concrete supports in place (healthy food, safe  environments, etc.), Sense of purpose, and Physical Health (exercise, healthy diet, medication compliance, etc.)  Goals Addressed: Patient will:  Reduce symptoms of: anxiety, depression, and stress   Demonstrate ability to: Increase motivation to adhere to plan of care  Progress towards Goals: Ongoing  Interventions: Interventions utilized:  ACT (Acceptance and Commitment Therapy) Standardized Assessments completed: Not Needed  Patient and/or Family Response: Patient agrees with treatment plan.   Assessment: Patient currently experiencing History of PTSD, ADHD; Psychosocial stress.   Patient may benefit from continued therapeutic interventions.  Plan: Follow up with behavioral health clinician on : Two weeks Behavioral recommendations:  -Continue utilizing daily self-coping strategies that have helped manage thoughts and emotions (including time with new kitten and keeping healthy boundaries, etc) -Continue plan to attend upcoming scheduled medical appointments -Continue working on artwork as energy permits Referral(s): [facility] (In Clinic)  I discussed the assessment and treatment plan with the patient and/or parent/guardian. They were provided an opportunity to ask questions and all were answered. They agreed with the plan and demonstrated an understanding of the instructions.   They were advised to call back or seek an in-person evaluation if the symptoms worsen or if the condition fails to improve as anticipated.  Caroleen Hamman Tahsin Benyo, LCSW

## 2022-11-03 LAB — BASIC METABOLIC PANEL
BUN/Creatinine Ratio: 13 (ref 9–23)
BUN: 9 mg/dL (ref 6–20)
CO2: 23 mmol/L (ref 20–29)
Calcium: 10.1 mg/dL (ref 8.7–10.2)
Chloride: 104 mmol/L (ref 96–106)
Creatinine, Ser: 0.68 mg/dL (ref 0.57–1.00)
Glucose: 81 mg/dL (ref 70–99)
Potassium: 4.4 mmol/L (ref 3.5–5.2)
Sodium: 141 mmol/L (ref 134–144)
eGFR: 116 mL/min/{1.73_m2} (ref >59)

## 2022-11-08 ENCOUNTER — Inpatient Hospital Stay: Payer: Medicaid Other | Admitting: Family Medicine

## 2022-11-09 ENCOUNTER — Telehealth: Payer: Self-pay

## 2022-11-09 NOTE — Telephone Encounter (Signed)
Spoke with patient RE: Rachel [name] (Order [id number]) . Instructions given to patient as to where to go and the available times for Lab to be drawn. [facility] clinical lab contact number given for additional questions and scheduling. Patient verbalized understanding of all discussed.

## 2022-11-10 NOTE — Telephone Encounter (Signed)
Noted  

## 2022-11-16 ENCOUNTER — Ambulatory Visit (INDEPENDENT_AMBULATORY_CARE_PROVIDER_SITE_OTHER): Payer: Medicaid Other | Admitting: Clinical

## 2022-11-16 DIAGNOSIS — Z658 Other specified problems related to psychosocial circumstances: Secondary | ICD-10-CM

## 2022-11-16 DIAGNOSIS — F4310 Post-traumatic stress disorder, unspecified: Secondary | ICD-10-CM

## 2022-11-16 DIAGNOSIS — F909 Attention-deficit hyperactivity disorder, unspecified type: Secondary | ICD-10-CM

## 2022-11-17 ENCOUNTER — Other Ambulatory Visit (HOSPITAL_COMMUNITY)
Admission: RE | Admit: 2022-11-17 | Discharge: 2022-11-17 | Disposition: A | Discharge status: Home / Self Care | Payer: Medicaid Other | Source: Other Acute Inpatient Hospital

## 2022-11-17 DIAGNOSIS — R1084 Generalized abdominal pain: Secondary | ICD-10-CM | POA: Diagnosis not present

## 2022-11-17 LAB — ACTX PHARMACOGENOMICS SERVICE-BLOOD

## 2022-11-21 NOTE — BH Specialist Note (Addendum)
Integrated Behavioral Health via Telemedicine Visit  12/01/2022 Jerzy Crotteau [id number]  Number of [place] Clinician visits: Additional Visit  Session Start time: 0865   Session End time: 7846  Total time in minutes: 63   Referring Provider: Lynnda Shields, MD Patient/Family location: Home [facility] Provider location: [facility]  All persons participating in visit: [name] and [place]   Types of Service: Individual psychotherapy and Telephone visit  I connected with [name] and/or Isabella Stalling Jakes's  n/a  via  Telephone or Video Enabled Telemedicine Application  (Video is Caregility application) and verified that I am speaking with the correct person using two identifiers. Discussed confidentiality: Yes   I discussed the limitations of telemedicine and the availability of in person appointments.  Discussed there is a possibility of technology failure and discussed alternative modes of communication if that failure occurs.  I discussed that engaging in this telemedicine visit, they consent to the provision of behavioral healthcare and the services will be billed under their insurance.  Patient and/or legal guardian expressed understanding and consented to Telemedicine visit: Yes   Presenting Concerns: Patient and/or family reports the following symptoms/concerns: Contemplating upcoming health decision she will be making today, along with processing insecurities in life.  Duration of problem: Ongoing; Severity of problem: moderate  Patient and/or Family's Strengths/Protective Factors: Social connections, Social and Emotional competence, Concrete supports in place (healthy food, safe environments, etc.), Sense of purpose, and Physical Health (exercise, healthy diet, medication compliance, etc.)  Goals Addressed: Patient will:  Maintain  reduction of symptoms of: anxiety, depression, and stress    Progress towards Goals: Ongoing  Interventions: Interventions utilized:  ACT (Acceptance and Commitment Therapy) Standardized Assessments completed: Not Needed  Patient and/or Family Response: Patient agrees with treatment plan.   Assessment: Patient currently experiencing PTSD, ADHD, Psychosocial stress.   Patient may benefit from continued therapeutic interventions.  Plan: Follow up with behavioral health clinician on : Two weeks Behavioral recommendations:  -Continue using daily self-coping strategies as needed -Begin using new gift(s) to create art Referral(s): [place] (In Clinic)  I discussed the assessment and treatment plan with the patient and/or parent/guardian. They were provided an opportunity to ask questions and all were answered. They agreed with the plan and demonstrated an understanding of the instructions.   They were advised to call back or seek an in-person evaluation if the symptoms worsen or if the condition fails to improve as anticipated.  [place], LCSW     11/02/2022    9:23 AM 08/02/2022    2:20 PM 08/02/2022    2:11 PM 05/02/2022    2:26 PM 03/06/2022    9:40 AM  Depression screen PHQ 2/9  Decreased Interest 0 0 0 0 0  Down, Depressed, Hopeless 1 0 0 0 0  PHQ - 2 Score 1 0 0 0 0  Altered sleeping '1 2  2   '$ Tired, decreased energy '1 2  1   '$ Change in appetite '1 1  2   '$ Feeling bad or failure about yourself  0 0  0   Trouble concentrating 0 1  1   Moving slowly or fidgety/restless 0 0  0   Suicidal thoughts 0 0  0   PHQ-9 Score '4 6  6       '$ 11/02/2022    9:24 AM 08/02/2022    2:20 PM 05/02/2022  2:26 PM 01/30/2022    9:56 AM  GAD 7 : Generalized Anxiety Score  Nervous, Anxious, on Edge 1 0 0 0  Control/stop worrying 0 0 0 0  Worry too much - different things 0 0 0 0  Trouble relaxing '1 1 2 1  '$ Restless 0 0 0 0  Easily annoyed or irritable 0 0  1 0  Afraid - awful might happen 0 0 0 0  Total GAD 7 Score '2 1 3 '$ 1

## 2022-11-22 ENCOUNTER — Other Ambulatory Visit: Payer: Self-pay | Admitting: Gastroenterology

## 2022-12-01 ENCOUNTER — Ambulatory Visit (INDEPENDENT_AMBULATORY_CARE_PROVIDER_SITE_OTHER): Payer: Medicaid Other | Admitting: Clinical

## 2022-12-01 DIAGNOSIS — F4310 Post-traumatic stress disorder, unspecified: Secondary | ICD-10-CM

## 2022-12-01 DIAGNOSIS — Z658 Other specified problems related to psychosocial circumstances: Secondary | ICD-10-CM

## 2022-12-01 DIAGNOSIS — K3189 Other diseases of stomach and duodenum: Secondary | ICD-10-CM | POA: Diagnosis not present

## 2022-12-01 DIAGNOSIS — F909 Attention-deficit hyperactivity disorder, unspecified type: Secondary | ICD-10-CM

## 2022-12-03 NOTE — Progress Notes (Signed)
New Patient Note  RE: Rachel [name] MRN: [id number] DOB: 1986/06/06 Date of Office Visit: 12/04/2022  Consult requested by: Charlott Rakes, MD Primary care provider: Charlott Rakes, MD  Chief Complaint: Establish Care, Allergic Reaction (Pt c/o of getting chronic conjunctivitis  ), Rash, Urticaria, Pruritus, Cough, and Sinus Problem  History of Present Illness: I had the pleasure of seeing Rachel [name] for initial evaluation at the [facility] on 12/04/2022. She is a 37 y.o. female, who is referred here by Charlott Rakes, MD for the evaluation of allergies. She is accompanied today by her mother who provided/contributed to the history.   Patient had many allergic reactions the past few years. She also has complains of skin issues.  She reports symptoms of sneezing, rhinorrhea, nasal congestion, itchy nose, chronic conjunctivitis (red, itchy, painful, watery). Eye symptoms have been going on for less than 1 year and the sinus symptoms for a many years but symptoms flared recently The symptoms are present all year around with worsening in fall and winter. Anosmia: diminished sense of smell. Headache: sometimes. She has used Human resources officer, Rhinocort, OTC refresh/contact eye drops with minimal improvement in symptoms. Sinus infections: maybe. Previous work up includes: skin testing in the past over 10+ years ago was positive to dogs, cats, pollen, mold per patient report. No prior AIT.  Previous ENT evaluation: had deviated septum surgery as a teen - no recent evaluation. Previous sinus imaging: no. History of nasal polyps: no. Last eye exam: within the past year. History of reflux: yes - and follows with GI for gastric volvulus. Going to have surgery for this later this year.   Rash started about in 2015. Mainly occurs on her abdominal area, hands, feet, upper thighs. Describes them as itchy, raised, red bumps, blistering. Individual rashes lasts about few  weeks at a time. No ecchymosis upon resolution. Associated symptoms include: brain fog, dizziness, tired, chest pain.  Frequency of episodes: every 1-2 months. Suspected triggers are unknown but worse with moving around, heat. Denies any fevers, chills, changes in medications, foods, personal care products or recent infections. She has tried the following therapies: topical triamcinolone steroid cream with some benefit. Systemic steroids - yes injections. Currently on allegra '180mg'$  once a day. Previous work up includes: no prior dermatology evaluation. Patient is up to date with the following cancer screening tests: physical exam. No pap smears - patient had total hysterectomy.  Allergic reaction Patient had allergic reaction in the form of wheezing, throat tightness, diffuse hives. She started to take Freida Busman a few days before that.  Patient was given epi, benadryl and steroids for this which helped.   Patient follows with GI for gastroparesis, has connective tissue disease - ? Drue Dun.   11/02/2022 PCP visit note: "She would like allergy testing as she thinks she might have environmental allergies as she also had an allergic reaction to Elavil.  She has had bad experience with febrile medication in the past. "  05/27/2021 ER visit: "37 year old female PMHx ADHD, bipolar 1, endometriosis, family history of connective disease, bib [facility] for allergic reaction.  Started Orilissa 4 days ago with mild reactions, today reaction severe with symptoms including wheezing, throat tightness, diffuse hives.  She additionally noted that she began having bloody bowel movements approximately 1 hour after taking the [place] today.  States that she has a strict diet in the context of her gastroparesis, and has not deviated from this in the last several days.  En route,  s/p IM epinephrine, Benadryl, Solu-Medrol with resolution of symptoms.  She has some persisting minor skin redness, sore throat, cough, and  congestion, but otherwise feels she has generally returned to baseline.   No further medical concern at this time, including fevers, chills, rhinorrhea, chest pain, palpitations, pedal edema, abdominal pain, vomiting, acute bowel/bladder changes, audiovisual change, seizure, syncope."  Assessment and Plan: [name] is a 37 y.o. female with: Other allergic rhinitis Conjunctivitis symptoms which flares in the fall and winter.  Skin testing over 10 years ago showed multiple positives per patient report.  No prior AIT.  Sinus surgery in the past.  2 dogs and 2 cats at home. Today's skin prick testing showed: Positive to mold, cat and dog only. Get bloodwork as positive control was only borderline positive.  Start environmental control measures as below. Use over the counter antihistamines such as Zyrtec (cetirizine), Claritin (loratadine), Allegra (fexofenadine), or Xyzal (levocetirizine) daily as needed. May take twice a day during allergy flares. May switch antihistamines every few months. Use Flonase (fluticasone) nasal spray 1 spray per nostril twice a day as needed for nasal congestion.  Nasal saline spray (i.e., Simply Saline) or nasal saline lavage (i.e., NeilMed) is recommended as needed and prior to medicated nasal sprays. Use cromolyn 4% 1 drop in each eye up to four times a day as needed for itchy/watery eyes.  Start allergy injections. Will make the vials after the bloodwork is back.  Had a detailed discussion with patient/family that clinical history is suggestive of allergic rhinitis, and may benefit from allergy immunotherapy (AIT). Discussed in detail regarding the dosing, schedule, side effects (mild to moderate local allergic reaction and rarely systemic allergic reactions including anaphylaxis), and benefits (significant improvement in nasal symptoms, seasonal flares of asthma) of immunotherapy with the patient. There is significant time commitment involved with allergy shots, which  includes weekly immunotherapy injections for first 9-12 months and then biweekly to monthly injections for 3-5 years.  I have prescribed epinephrine injectable and demonstrated proper use. For mild symptoms you can take over the counter antihistamines such as Benadryl and monitor symptoms closely. If symptoms worsen or if you have severe symptoms including breathing issues, throat closure, significant swelling, whole body hives, severe diarrhea and vomiting, lightheadedness then inject epinephrine and seek immediate medical care afterwards. Emergency action plan given.  Allergic conjunctivitis of both eyes See assessment and plan as above.  Rash and other nonspecific skin eruption Breaking out in random itchy rashes since 2015. No specific triggers noted but has a flare every 1-2 months. Symptoms seems to lasts for a few weeks at a time. History of eczema. No prior dermatology evaluation. Discussed with patient that I'm not sure how much of the above allergens are contributing to her skin issues.  See below for proper skin care. Keep track of outbreaks and take pictures. May need to see dermatology next.  Continue with allegra '180mg'$  once a day - may take twice a day during flares. If symptoms are not controlled or causes drowsiness let us know. Avoid the following potential triggers: alcohol, tight clothing, NSAIDs, hot showers and getting overheated. Get bloodwork to rule out other etiologies.   Allergic reaction Patient had multiple reaction to various medications but then has milder allergic reactions with no known triggers. Keep track of symptoms and episodes.  Get bloodwork. For mild symptoms you can take over the counter antihistamines such as Benadryl and monitor symptoms closely. If symptoms worsen or if you have severe symptoms including breathing  issues, throat closure, significant swelling, whole body hives, severe diarrhea and vomiting, lightheadedness then inject epinephrine and  seek immediate medical care afterwards.  Multiple drug allergies Reactions to multiple drugs. Freida Busman required ER visit with IM epi. Penicillin reaction as a young child in the form of rash/hives. Avoid drugs on allergy list. Recommend penicillin skin testing first.  If interested we can schedule drug challenge to antibiotics - one at a time. You must be off antihistamines for 3-5 days before. Must be in good health and not ill. No vaccines/injections/antibiotics within the past 7 days. Plan on being in the office for 2-3 hours and must bring in the drug you want to do the oral challenge for - will send in prescription to pick up a few days before. You must call to schedule an appointment and specify it's for a drug challenge.   Recurrent infections Sepsis x 2,  pneumonia, ear infections. Usually needs 2-4 courses of antibiotics per year. Keep track of infections and antibiotics use. Get bloodwork to look at immune system.   Mild intermittent asthma without complication Usually flares with cold weather and infection. Tried Singulair and Advair in the past.  Today's spirometry was normal. May use albuterol rescue inhaler 2 puffs or nebulizer every 4 to 6 hours as needed for shortness of breath, chest tightness, coughing, and wheezing.  Monitor frequency of use.   Other adverse food reactions, not elsewhere classified, subsequent encounter Pork causes perioral sores and tingling. Dairy flares her GI symptoms. Today's skin testing negative to select foods. Avoid pork and dairy. She may have lactose intolerance.   Return in about 3 months (around 03/05/2023).  Meds ordered this encounter  Medications   DISCONTD: EPINEPHrine 0.3 mg/0.3 mL IJ SOAJ injection    Sig: Inject 0.3 mg into the muscle as needed for anaphylaxis.    Dispense:  2 each    Refill:  0   fluticasone (FLONASE) 50 MCG/ACT nasal spray    Sig: Place 1 spray into both nostrils 2 (two) times daily as needed (nasal  congestion).    Dispense:  16 g    Refill:  3   cromolyn (OPTICROM) 4 % ophthalmic solution    Sig: Place 1 drop into both eyes 4 (four) times daily as needed (itchy/watery eyes).    Dispense:  10 mL    Refill:  3   EPINEPHrine 0.3 mg/0.3 mL IJ SOAJ injection    Sig: Inject 0.3 mg into the muscle as needed for anaphylaxis.    Dispense:  2 each    Refill:  1    May dispense generic/Mylan/Teva brand.   Lab Orders         Alpha-Gal Panel         ANA w/Reflex         C3 and C4         CBC with Differential/Platelet         Chronic Urticaria         Comprehensive metabolic panel         Complement, total         C-reactive protein         Diphtheria / Tetanus Antibody Panel         IgG, IgA, IgM         Tryptase         Thyroid Cascade Profile         Strep pneumoniae 23 Serotypes IgG  Sedimentation rate         Allergens w/Total IgE Area 2         Latex, IgE      Other allergy screening: Asthma: yes Usually flares with cold weather and infection. Last albuterol use a few weeks ago. Used to be on Singulair and Advair in the past.   Food allergy:  Pork seems to cause sores around her mouth tingling. Dairy flares her GI symptoms.  Medication allergy: yes No issues with Tylenol and ibuprofen. Penicillin - rash/hives as a child.   Hymenoptera allergy: no Eczema:yes On face and antecubital fossa - uses triamcinolone prn with good benefit.   History of recurrent infections suggestive of immunodeficency:  multiple kidney infections, sepsis x 2 Patient has history multiple infections including pneumonia, ear infections. Denies any GI infections/diarrhea, skin infections/abscesses. Patient has no history of opportunistic infections. Patient reports 2-4 antibiotic use in the last 12 months and 2 hospital admissions. Patient does not have any secondary causes of immunodeficiency including chronic steroid use, diabetes mellitus, protein losing enteropathy, renal or hepatic  dysfunction, history of cancer or irradiation or history of HIV, hepatitis B or C.  Diagnostics: Spirometry:  Tracings reviewed. Her effort: It was hard to get consistent efforts and there is a question as to whether this reflects a maximal maneuver. FVC: 3.62L FEV1: 2.95L, 104% predicted FEV1/FVC ratio: 81% Interpretation: No overt abnormalities noted given today's efforts.  Please see scanned spirometry results for details.  Skin Testing: Environmental allergy panel and select foods. Positive to mold, cat and dog. Positive control only borderline positive.  Negative to select foods.  Results discussed with patient/family.  Airborne Adult Perc - 12/04/22 1445     Time Antigen Placed 1450    Allergen Manufacturer Lavella Hammock    Location Back    Number of Test 59    1. Control-Buffer 50% Glycerol Negative    2. Control-Histamine 1 mg/ml --   +/-   3. Albumin saline Negative    4. [place] Negative    5. [place] Negative    6. Johnson Negative    7. Animas Blue Negative    8. Meadow Fescue Negative    9. Perennial Rye Negative    10. Sweet Vernal Negative    11. Timothy Negative    12. Cocklebur Negative    13. Burweed Marshelder Negative    14. Ragweed, short Negative    15. Ragweed, Giant Negative    16. Plantain,  English Negative    17. Lamb's Quarters Negative    18. Sheep Sorrell Negative    19. Rough Pigweed Negative    20. Marsh Elder, Rough Negative    21. Mugwort, Common Negative    22. Ash mix Negative    23. Birch mix Negative    24. Beech American Negative    25. Box, Elder Negative    26. Cedar, red Negative    27. Cottonwood, [place] Negative    28. Elm mix Negative    29. Hickory Negative    30. Maple mix Negative    31. Oak, [place] mix Negative    32. Pecan Pollen Negative    33. Pine mix Negative    34. Sycamore Eastern Negative    35. [place], Black Pollen Negative    36. Alternaria alternata 2+    37. Cladosporium Herbarum Negative    38.  Aspergillus mix Negative    39. Penicillium mix Negative    40. Bipolaris sorokiniana (Helminthosporium) Negative  41. Drechslera spicifera (Curvularia) Negative    42. Mucor plumbeus Negative    43. Fusarium moniliforme Negative    44. Aureobasidium pullulans (pullulara) Negative    45. Rhizopus oryzae Negative    46. Botrytis cinera Negative    47. Epicoccum nigrum Negative    48. Phoma betae Negative    49. Candida Albicans Negative    50. Trichophyton mentagrophytes Negative    51. Mite, D Farinae  5,000 AU/ml Negative    52. Mite, D Pteronyssinus  5,000 AU/ml Negative    53. Cat Hair 10,000 BAU/ml 2+    54.  Dog Epithelia 2+    55. Mixed Feathers Negative    56. Horse Epithelia Negative    57. Cockroach, German Negative    58. Mouse Negative    59. Tobacco Leaf Negative             Food Adult Perc - 12/04/22 1400     Time Antigen Placed 1451    Allergen Manufacturer Lavella Hammock    Location Back    Number of allergen test 11    1. Peanut Negative    2. Soybean Negative    3. Wheat Negative    4. Sesame Negative    5. Milk, cow Negative    6. Egg White, Chicken Negative    7. Casein Negative    8. Shellfish Mix Negative    9. Fish Mix Negative    10. Cashew Negative    37. Pork Negative             Past Medical History: Patient Active Problem List   Diagnosis Date Noted   Other allergic rhinitis 12/04/2022   Allergic conjunctivitis of both eyes 12/04/2022   Mild intermittent asthma without complication 27/78/2423   Other atopic dermatitis 12/04/2022   Allergic reaction 12/04/2022   Rash and other nonspecific skin eruption 12/04/2022   Multiple drug allergies 12/04/2022   Other adverse food reactions, not elsewhere classified, subsequent encounter 12/04/2022   Abnormal CT of the abdomen 09/23/2022   Abdominal pain 09/22/2022   Asthma, chronic 09/22/2022   HLD (hyperlipidemia) 09/22/2022   Nausea & vomiting 09/22/2022   Iron deficiency anemia  01/20/2022   Acute hyponatremia 01/18/2022   Pyelonephritis 01/18/2022   Protein-calorie malnutrition, severe 01/18/2022   sepsis secondary to pyelonephritis of left kidney 01/17/2022   Sepsis ([place]) 01/17/2022   Chronic abdominal pain 01/17/2022   Dyspnea 01/17/2022   Underweight 01/17/2022   Irritable bowel syndrome (IBS) 01/17/2022   Hypokalemia 01/17/2022   Vasomotor symptoms due to menopause 08/17/2021   S/P laparoscopic hysterectomy 07/12/2021   Preoperative exam for gynecologic surgery 06/20/2021   Adverse drug reaction 06/01/2021   Draining postoperative wound 02/28/2021   Encounter for postoperative care 02/16/2021   Endometriosis determined by laparoscopy    Pelvic pain in female 10/11/2020   Presence of subdermal contraceptive implant 10/11/2020   Dysmenorrhea 10/11/2020   Dyspareunia in female 10/11/2020   NSVT (nonsustained ventricular tachycardia) ([place]) 09/03/2020   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   Palpitations 07/26/2020   Family history of connective tissue disease 07/26/2020   Myalgia 04/06/2020   Hiatal hernia 04/06/2020   Recurrent infections 02/23/2020   Weight loss 02/23/2020   Poor appetite 02/23/2020   RUQ pain 02/23/2020   Delayed gastric emptying 02/23/2020   DUB (dysfunctional uterine bleeding) 02/23/2020   Cervical radiculopathy 05/31/2018   Hyperalgesia 05/31/2018   GERD (gastroesophageal reflux disease) 12/26/2017   ADHD (attention deficit hyperactivity disorder) 05/07/2012  Bipolar 1 disorder ([place]) 05/07/2012   Past Medical History:  Diagnosis Date   ADHD (attention deficit hyperactivity disorder)    Anemia    Anxiety    Asthma    Bipolar disorder ([place])    Complication of anesthesia    slow to wake up    Delayed gastric emptying 02/23/2020   Depression    DUB (dysfunctional uterine bleeding) 02/23/2020   Eczema    Ehlers-Danlos syndrome    Endometriosis    Gastroparesis    GERD (gastroesophageal reflux disease)    Heart  murmur    Hiatal hernia 04/06/2020   Myalgia 04/06/2020   Nausea & vomiting 09/22/2022   Parasitic infection 02/23/2020   Pneumonia    Poor appetite 02/23/2020   Recurrent upper respiratory infection (URI)    RUQ pain 02/23/2020   Vitamin D deficiency    Weight loss 02/23/2020   Past Surgical History: Past Surgical History:  Procedure Laterality Date   barium study     COLONOSCOPY     CYSTOSCOPY N/A 07/12/2021   Procedure: CYSTOSCOPY;  Surgeon: [name], MD;  Location: [facility];  Service: Gynecology;  Laterality: N/A;   ESOPHAGOGASTRODUODENOSCOPY  2011   and a ph study as well.    LAPAROSCOPY N/A 01/26/2021   Procedure: LAPAROSCOPY DIAGNOSTIC;  Surgeon: [name], MD;  Location: [facility];  Service: Gynecology;  Laterality: N/A;   NASAL SEPTUM SURGERY     SINOSCOPY     TONGUE FLAP     TOTAL LAPAROSCOPIC HYSTERECTOMY WITH SALPINGECTOMY Bilateral 07/12/2021   Procedure: TOTAL LAPAROSCOPIC HYSTERECTOMY WITH SALPINGECTOMY, OOPHORECTOMY;  Surgeon: [name], MD;  Location: [facility];  Service: Gynecology;  Laterality: Bilateral;   UPPER GASTROINTESTINAL ENDOSCOPY     WISDOM TOOTH EXTRACTION     Medication List:  Current Outpatient Medications  Medication Sig Dispense Refill   acetaminophen (TYLENOL) 500 MG tablet Take by mouth.     albuterol (PROVENTIL) (2.5 MG/3ML) 0.083% nebulizer solution Take 3 mLs (2.5 mg total) by nebulization every 6 (six) hours as needed for wheezing or shortness of breath. Max 4 doses per day 75 mL 0   albuterol (VENTOLIN HFA) 108 (90 Base) MCG/ACT inhaler Inhale 1-2 puffs into the lungs every 6 (six) hours as needed for wheezing or shortness of breath. 8 g 1   Ascorbic Acid (VITAMIN C) 1000 MG tablet Take 1,000 mg by mouth 2 (two) times daily.     atorvastatin (LIPITOR) 20 MG tablet TAKE 1 TABLET BY MOUTH DAILY 90 tablet 0   B Complex Vitamins (B COMPLEX PO) Take 1 capsule by mouth daily.     Calcium-Magnesium-Vitamin D  600-40-500 MG-MG-UNIT TB24 Take 3 tablets by mouth daily.     Cholecalciferol (VITAMIN D3) 50 MCG (2000 UT) TABS Take 2,000 Units by mouth daily.     cromolyn (OPTICROM) 4 % ophthalmic solution Place 1 drop into both eyes 4 (four) times daily as needed (itchy/watery eyes). 10 mL 3   ELDERBERRY PO Take 1 tablet by mouth daily.     EPINEPHrine 0.3 mg/0.3 mL IJ SOAJ injection Inject 0.3 mg into the muscle as needed for anaphylaxis. 2 each 1   estrogens, conjugated, (PREMARIN) 0.625 MG tablet Take 1 tablet (0.625 mg total) by mouth daily. Take daily for 21 days then do not take for 7 days. 30 tablet 11   fexofenadine (ALLEGRA) 180 MG tablet Take 180 mg by mouth daily.     fluticasone (FLONASE) 50 MCG/ACT nasal  spray Place 1 spray into both nostrils 2 (two) times daily as needed (nasal congestion). 16 g 3   Iron, Ferrous Sulfate, 325 (65 Fe) MG TABS Take 325 mg by mouth daily. 60 tablet 2   ketorolac (ACULAR) 0.5 % ophthalmic solution Place 1 drop into the right eye 4 (four) times daily. 3 mL 0   linaclotide (LINZESS) 290 MCG CAPS capsule Take 1 capsule (290 mcg total) by mouth daily before breakfast. 30 capsule 0   Melatonin 10 MG TABS Take 10 mg by mouth at bedtime.     MIRALAX 17 GM/SCOOP powder Take 17 g by mouth See admin instructions. Mix 17 g of powder into water and drink once a day     Multiple Vitamins-Minerals (ADULT ONE DAILY GUMMIES PO) Take 2 tablets by mouth daily.     Omega-3 Fatty Acids (FISH OIL) 1200 MG CAPS Take 1,200 mg by mouth daily.     potassium chloride SA (KLOR-CON M) 20 MEQ tablet Take 1 tablet (20 mEq total) by mouth 2 (two) times daily. 3 tablet 0   promethazine (PHENERGAN) 12.5 MG tablet Take 12.5 mg by mouth See admin instructions. Take 12.5 mg by mouth every 6-8 hours as needed for nausea     sennosides-docusate sodium (SENOKOT-S) 8.6-50 MG tablet Take 1-2 tablets by mouth daily as needed for constipation.     sulfacetamide (BLEPH-10) 10 % ophthalmic solution Place 1-2  drops into the right eye every 3 (three) hours. Use while awake 5 mL 0   triamcinolone cream (KENALOG) 0.1 % APPLY TO AFFECTED AREA(S) TWO TIMES A DAY (Patient taking differently: Apply 1 Application topically 2 (two) times daily as needed (rash, itching).) 45 g 1   VITAMIN A PO Take 1 capsule by mouth daily.     No current facility-administered medications for this visit.   Allergies: Allergies  Allergen Reactions   Amitriptyline Anaphylaxis   Amphetamine-Dextroamphetamine Other (See Comments)    Triggered bipolar, caused depression, and suicidal thoughts   Lupron [Leuprolide] Anaphylaxis   Orilissa [Elagolix] Anaphylaxis   Penicillins Anaphylaxis, Swelling and Other (See Comments)    Potential for anaphylaxis confirmed. Tolerates Cefphalosporins No "-cillins"!!!! Did it involve swelling of the face/tongue/throat, SOB, or low BP? Yes Did it involve sudden or severe rash/hives, skin peeling, or any reaction on the inside of your mouth or nose? Unknown Did you need to seek medical attention at a hospital or doctor's office? Unknown When did it last happen? teenager      If all above answers are "NO", may proceed with cephalosporin use.   Chlorhexidine Hives    DuraPrep    Doxycycline Itching, Nausea And Vomiting and Other (See Comments)    Caused high fever, also   Mixed Ragweed Other (See Comments)    Unknown, Mother and pt state she can't have    Olanzapine Other (See Comments)    Caused mania   Other Other (See Comments)    Stimulants, mood stabilizers, and antidepressants = Triggered bipolar, caused depression, and suicidal thoughts   Prozac [Fluoxetine Hcl] Other (See Comments)    Triggered bipolar, caused depression, and suicidal thoughts   Stevia Glycerite Extract [Flavoring Agent] Other (See Comments)    Unknown reaction; mother and pt confirm any Artificial Sweeteners cause reaction   Tape Other (See Comments)    Paper tape is tolerated   Triple Antibiotic Pain Relief  [Neomy-Bacit-Polymyx-Pramoxine] Hives   Reglan [Metoclopramide] Anxiety and Other (See Comments)    Triggered bipolar, caused depression, suicidal  thoughts, muscle twitching, stiffness, and high anxiety    Social History: Social History   Socioeconomic History   Marital status: Divorced    Spouse name: Not on file   Number of children: Not on file   Years of education: Not on file   Highest education level: Not on file  Occupational History   Not on file  Tobacco Use   Smoking status: Never    Passive exposure: Never   Smokeless tobacco: Never  Vaping Use   Vaping Use: Never used  Substance and Sexual Activity   Alcohol use: Yes    Alcohol/week: 1.0 standard drink of alcohol    Types: 1 Glasses of wine per week    Comment: 1 glass of wine every other week   Drug use: No   Sexual activity: Not Currently  Other Topics Concern   Not on file  Social History Narrative   Not on file   Social Determinants of Health   Financial Resource Strain: Not on file  Food Insecurity: Food Insecurity Present (09/22/2022)   Hunger Vital Sign    Worried About Running Out of Food in the Last Year: Often true    Ran Out of Food in the Last Year: Often true  Transportation Needs: No Transportation Needs (09/22/2022)   PRAPARE - Hydrologist (Medical): No    Lack of Transportation (Non-Medical): No  Physical Activity: Not on file  Stress: Not on file  Social Connections: Not on file   Lives in a house. Smoking: denies Occupation: disabled  Environmental HistoryFreight forwarder in the house: no Charity fundraiser in the family room: no Carpet in the bedroom: no Heating: gas Cooling: central Pet: yes 2 dogs and 2 cats  Family History: Family History  Problem Relation Age of Onset   Eczema Mother    Urticaria Mother    Allergic rhinitis Mother    Colon polyps Mother    Depression Mother    Anxiety disorder Mother    Hypertension Mother    Hyperlipidemia  Mother    Asthma Mother    Endometriosis Mother    Fibroids Mother    Fibromyalgia Mother    Migraines Mother    Irritable bowel syndrome Mother    Allergic rhinitis Father    Depression Father    Hypertension Father    Hyperlipidemia Father    Bipolar disorder Father    Urticaria Sister    Allergic rhinitis Sister    Bipolar disorder Sister    ADD / ADHD Sister    Asthma Sister    Endometriosis Sister    Seizures Sister    Bipolar disorder Sister    Anxiety disorder Sister    Heart disease Maternal Grandmother    Endometriosis Maternal Grandmother    Fibroids Maternal Grandmother    Hyperlipidemia Maternal Grandmother    Hypertension Maternal Grandmother    Diabetes Maternal Grandmother    Fibromyalgia Maternal Grandmother    Heart disease Maternal Grandfather    Diabetes Maternal Grandfather    Heart disease Paternal Grandmother    Heart disease Paternal Grandfather    Colon cancer Maternal Great-grandmother    Pancreatic cancer Neg Hx    Esophageal cancer Neg Hx    Stomach cancer Neg Hx    Review of Systems  Constitutional:  Positive for chills and fever. Negative for appetite change and unexpected weight change.  HENT:  Negative for congestion and rhinorrhea.   Eyes:  Negative for itching.  Respiratory:  Negative for cough, chest tightness, shortness of breath and wheezing.   Cardiovascular:  Negative for chest pain.  Gastrointestinal:  Positive for abdominal pain.  Genitourinary:  Negative for difficulty urinating.  Skin:  Positive for rash.  Allergic/Immunologic: Positive for environmental allergies.  Neurological:  Positive for headaches.    Objective: BP 100/80   Pulse 75   Temp 98.2 F (36.8 C)   Resp 20   Ht 5' 1.81" (1.57 m)   Wt 113 lb 6.4 oz (51.4 kg)   LMP  (LMP Unknown)   SpO2 97%   BMI 20.87 kg/m  Body mass index is 20.87 kg/m. Physical Exam Vitals and nursing note reviewed.  Constitutional:      Appearance: Normal appearance. She is  well-developed.  HENT:     Head: Normocephalic and atraumatic.     Right Ear: Tympanic membrane and external ear normal.     Left Ear: Tympanic membrane and external ear normal.     Nose: Nose normal.     Mouth/Throat:     Mouth: Mucous membranes are moist.     Pharynx: Oropharynx is clear.  Eyes:     Conjunctiva/sclera: Conjunctivae normal.  Cardiovascular:     Rate and Rhythm: Normal rate and regular rhythm.     Heart sounds: Normal heart sounds. No murmur heard.    No friction rub. No gallop.  Pulmonary:     Effort: Pulmonary effort is normal.     Breath sounds: Normal breath sounds. No wheezing, rhonchi or rales.  Musculoskeletal:     Cervical back: Neck supple.  Skin:    General: Skin is warm.     Findings: No rash.  Neurological:     Mental Status: She is alert and oriented to person, place, and time.  Psychiatric:        Behavior: Behavior normal.   The plan was reviewed with the patient/family, and all questions/concerned were addressed.  It was my pleasure to see Rachel [name] today and participate in her care. Please feel free to contact me with any questions or concerns.  Sincerely,  Rexene Alberts, DO Allergy & Immunology  [facility] office: [phone] [place] office: [phone]  Total Time: 65 minutes Time spent on day of service preparing to see patient; obtaining and reviewing separately obtained history;  performing examination; counseling and educating patient and family; ordering medications, tests and procedures; documenting clinical information in the health record; independently interpreting results and communicating to patient/family.

## 2022-12-04 ENCOUNTER — Encounter: Payer: Self-pay | Admitting: Allergy

## 2022-12-04 ENCOUNTER — Ambulatory Visit: Payer: Commercial Managed Care - PPO | Admitting: Allergy

## 2022-12-04 ENCOUNTER — Other Ambulatory Visit: Payer: Self-pay

## 2022-12-04 VITALS — BP 100/80 | HR 75 | Temp 98.2°F | Resp 20 | Ht 61.81 in | Wt 113.4 lb

## 2022-12-04 DIAGNOSIS — J4520 Mild intermittent asthma, uncomplicated: Secondary | ICD-10-CM

## 2022-12-04 DIAGNOSIS — J3089 Other allergic rhinitis: Secondary | ICD-10-CM

## 2022-12-04 DIAGNOSIS — H1013 Acute atopic conjunctivitis, bilateral: Secondary | ICD-10-CM

## 2022-12-04 DIAGNOSIS — T7840XA Allergy, unspecified, initial encounter: Secondary | ICD-10-CM | POA: Insufficient documentation

## 2022-12-04 DIAGNOSIS — R21 Rash and other nonspecific skin eruption: Secondary | ICD-10-CM | POA: Diagnosis not present

## 2022-12-04 DIAGNOSIS — T781XXD Other adverse food reactions, not elsewhere classified, subsequent encounter: Secondary | ICD-10-CM

## 2022-12-04 DIAGNOSIS — L2089 Other atopic dermatitis: Secondary | ICD-10-CM | POA: Diagnosis not present

## 2022-12-04 DIAGNOSIS — B999 Unspecified infectious disease: Secondary | ICD-10-CM

## 2022-12-04 DIAGNOSIS — T7840XD Allergy, unspecified, subsequent encounter: Secondary | ICD-10-CM | POA: Diagnosis not present

## 2022-12-04 DIAGNOSIS — Z889 Allergy status to unspecified drugs, medicaments and biological substances status: Secondary | ICD-10-CM | POA: Diagnosis not present

## 2022-12-04 MED ORDER — EPINEPHRINE 0.3 MG/0.3ML IJ SOAJ: 0.3000 mg | INTRAMUSCULAR | 1 refills | Status: DC | PRN

## 2022-12-04 MED ORDER — FLUTICASONE PROPIONATE 50 MCG/ACT NA SUSP: 1.0000 | Freq: Two times a day (BID) | NASAL | 3 refills | Status: DC | PRN

## 2022-12-04 MED ORDER — CROMOLYN SODIUM 4 % OP SOLN: 1.0000 [drp] | Freq: Four times a day (QID) | OPHTHALMIC | 3 refills | Status: DC | PRN

## 2022-12-04 MED ORDER — EPINEPHRINE 0.3 MG/0.3ML IJ SOAJ: 0.3000 mg | INTRAMUSCULAR | 0 refills | Status: DC | PRN

## 2022-12-04 NOTE — Assessment & Plan Note (Signed)
Patient had multiple reaction to various medications but then has milder allergic reactions with no known triggers. Keep track of symptoms and episodes.  Get bloodwork. For mild symptoms you can take over the counter antihistamines such as Benadryl and monitor symptoms closely. If symptoms worsen or if you have severe symptoms including breathing issues, throat closure, significant swelling, whole body hives, severe diarrhea and vomiting, lightheadedness then inject epinephrine and seek immediate medical care afterwards.

## 2022-12-04 NOTE — Assessment & Plan Note (Signed)
Sepsis x 2,  pneumonia, ear infections. Usually needs 2-4 courses of antibiotics per year. Keep track of infections and antibiotics use. Get bloodwork to look at immune system.

## 2022-12-04 NOTE — Assessment & Plan Note (Signed)
Pork causes perioral sores and tingling. Dairy flares her GI symptoms. Today's skin testing negative to select foods. Avoid pork and dairy. She may have lactose intolerance.

## 2022-12-04 NOTE — Assessment & Plan Note (Signed)
Usually flares with cold weather and infection. Tried Singulair and Advair in the past.  Today's spirometry was normal. May use albuterol rescue inhaler 2 puffs or nebulizer every 4 to 6 hours as needed for shortness of breath, chest tightness, coughing, and wheezing.  Monitor frequency of use.

## 2022-12-04 NOTE — Assessment & Plan Note (Addendum)
Breaking out in random itchy rashes since 2015. No specific triggers noted but has a flare every 1-2 months. Symptoms seems to lasts for a few weeks at a time. History of eczema. No prior dermatology evaluation. Discussed with patient that I'm not sure how much of the above allergens are contributing to her skin issues.  See below for proper skin care. Keep track of outbreaks and take pictures. May need to see dermatology next.  Continue with allegra '180mg'$  once a day - may take twice a day during flares. If symptoms are not controlled or causes drowsiness let us know. Avoid the following potential triggers: alcohol, tight clothing, NSAIDs, hot showers and getting overheated. Get bloodwork to rule out other etiologies.

## 2022-12-04 NOTE — Patient Instructions (Addendum)
Today's skin testing showed: Positive to mold, cat and dog only.  Results given.  Environmental allergies Start environmental control measures as below. Use over the counter antihistamines such as Zyrtec (cetirizine), Claritin (loratadine), Allegra (fexofenadine), or Xyzal (levocetirizine) daily as needed. May take twice a day during allergy flares. May switch antihistamines every few months. Use Flonase (fluticasone) nasal spray 1 spray per nostril twice a day as needed for nasal congestion.  Nasal saline spray (i.e., Simply Saline) or nasal saline lavage (i.e., NeilMed) is recommended as needed and prior to medicated nasal sprays. Use cromolyn 4% 1 drop in each eye up to four times a day as needed for itchy/watery eyes.  Start allergy injections. Will make the vials after the bloodwork is back.  Had a detailed discussion with patient/family that clinical history is suggestive of allergic rhinitis, and may benefit from allergy immunotherapy (AIT). Discussed in detail regarding the dosing, schedule, side effects (mild to moderate local allergic reaction and rarely systemic allergic reactions including anaphylaxis), and benefits (significant improvement in nasal symptoms, seasonal flares of asthma) of immunotherapy with the patient. There is significant time commitment involved with allergy shots, which includes weekly immunotherapy injections for first 9-12 months and then biweekly to monthly injections for 3-5 years.  I have prescribed epinephrine injectable and demonstrated proper use. For mild symptoms you can take over the counter antihistamines such as Benadryl and monitor symptoms closely. If symptoms worsen or if you have severe symptoms including breathing issues, throat closure, significant swelling, whole body hives, severe diarrhea and vomiting, lightheadedness then inject epinephrine and seek immediate medical care afterwards. Emergency action plan given.  Skin See below for proper skin  care. Keep track of outbreaks and take pictures. May need to see dermatology next.  Continue with allegra '180mg'$  once a day - may take twice a day during flares. If symptoms are not controlled or causes drowsiness let us know. Avoid the following potential triggers: alcohol, tight clothing, NSAIDs, hot showers and getting overheated. Get bloodwork:  We are ordering labs, so please allow 1-2 weeks for the results to come back. With the newly implemented Cures Act, the labs might be visible to you at the same time that they become visible to me. However, I will not address the results until all of the results are back, so please be patient.    Asthma Breathing test unremarkable.  May use albuterol rescue inhaler 2 puffs or nebulizer every 4 to 6 hours as needed for shortness of breath, chest tightness, coughing, and wheezing.  Monitor frequency of use.   Allergic reactions Keep track of symptoms and episodes.  Get bloodwork.  Food Avoid pork.  Multiple drug allergies Avoid drugs on allergy list. Recommend penicillin ski testing first.  If interested we can schedule drug challenge to antibiotics - one at a time. You must be off antihistamines for 3-5 days before. Must be in good health and not ill. No vaccines/injections/antibiotics within the past 7 days. Plan on being in the office for 2-3 hours and must bring in the drug you want to do the oral challenge for - will send in prescription to pick up a few days before. You must call to schedule an appointment and specify it's for a drug challenge.   Recurrent infections Keep track of infections and antibiotics use. Get bloodwork to look at immune system.   Follow up in 3 months or sooner if needed.    Pet Allergen Avoidance: Contrary to popular opinion, there are no "  hypoallergenic" breeds of dogs or cats. That is because people are not allergic to an animal's hair, but to an allergen found in the animal's saliva, dander (dead skin  flakes) or urine. Pet allergy symptoms typically occur within minutes. For some people, symptoms can build up and become most severe 8 to 12 hours after contact with the animal. People with severe allergies can experience reactions in public places if dander has been transported on the pet owners' clothing. Keeping an animal outdoors is only a partial solution, since homes with pets in the yard still have higher concentrations of animal allergens. Before getting a pet, ask your allergist to determine if you are allergic to animals. If your pet is already considered part of your family, try to minimize contact and keep the pet out of the bedroom and other rooms where you spend a great deal of time. As with dust mites, vacuum carpets often or replace carpet with a hardwood floor, tile or linoleum. High-efficiency particulate air (HEPA) cleaners can reduce allergen levels over time. While dander and saliva are the source of cat and dog allergens, urine is the source of allergens from rabbits, hamsters, mice and [place] pigs; so ask a non-allergic family member to clean the animal's cage. If you have a pet allergy, talk to your allergist about the potential for allergy immunotherapy (allergy shots). This strategy can often provide long-term relief. Mold Control Mold and fungi can grow on a variety of surfaces provided certain temperature and moisture conditions exist.  Outdoor molds grow on plants, decaying vegetation and soil. The major outdoor mold, Alternaria and Cladosporium, are found in very high numbers during hot and dry conditions. Generally, a late summer - fall peak is seen for common outdoor fungal spores. Rain will temporarily lower outdoor mold spore count, but counts rise rapidly when the rainy period ends. The most important indoor molds are Aspergillus and Penicillium. Dark, humid and poorly ventilated basements are ideal sites for mold growth. The next most common sites of mold growth are the  bathroom and the kitchen. Outdoor (Seasonal) Mold Control Use air conditioning and keep windows closed. Avoid exposure to decaying vegetation. Avoid leaf raking. Avoid grain handling. Consider wearing a face mask if working in moldy areas.  Indoor (Perennial) Mold Control  Maintain humidity below 50%. Get rid of mold growth on hard surfaces with water, detergent and, if necessary, 5% bleach (do not mix with other cleaners). Then dry the area completely. If mold covers an area more than 10 square feet, consider hiring an indoor environmental professional. For clothing, washing with soap and water is best. If moldy items cannot be cleaned and dried, throw them away. Remove sources e.g. contaminated carpets. Repair and seal leaking roofs or pipes. Using dehumidifiers in damp basements may be helpful, but empty the water and clean units regularly to prevent mildew from forming. All rooms, especially basements, bathrooms and kitchens, require ventilation and cleaning to deter mold and mildew growth. Avoid carpeting on concrete or damp floors, and storing items in damp areas.   Skin care recommendations  Bath time: Always use lukewarm water. AVOID very hot or cold water. Keep bathing time to 5-10 minutes. Do NOT use bubble bath. Use a mild soap and use just enough to wash the dirty areas. Do NOT scrub skin vigorously.  After bathing, pat dry your skin with a towel. Do NOT rub or scrub the skin.  Moisturizers and prescriptions:  ALWAYS apply moisturizers immediately after bathing (within 3 minutes).  This helps to lock-in moisture. Use the moisturizer several times a day over the whole body. Good summer moisturizers include: Aveeno, CeraVe, Cetaphil. Good winter moisturizers include: Aquaphor, Vaseline, Cerave, Cetaphil, Eucerin, Vanicream. When using moisturizers along with medications, the moisturizer should be applied about one hour after applying the medication to prevent diluting effect of  the medication or moisturize around where you applied the medications. When not using medications, the moisturizer can be continued twice daily as maintenance.  Laundry and clothing: Avoid laundry products with added color or perfumes. Use unscented hypo-allergenic laundry products such as Tide free, Cheer free & gentle, and All free and clear.  If the skin still seems dry or sensitive, you can try double-rinsing the clothes. Avoid tight or scratchy clothing such as wool. Do not use fabric softeners or dyer sheets.

## 2022-12-04 NOTE — Assessment & Plan Note (Signed)
Reactions to multiple drugs. Rachel [name] required ER visit with IM epi. Penicillin reaction as a young child in the form of rash/hives. Avoid drugs on allergy list. Recommend penicillin skin testing first.  If interested we can schedule drug challenge to antibiotics - one at a time. You must be off antihistamines for 3-5 days before. Must be in good health and not ill. No vaccines/injections/antibiotics within the past 7 days. Plan on being in the office for 2-3 hours and must bring in the drug you want to do the oral challenge for - will send in prescription to pick up a few days before. You must call to schedule an appointment and specify it's for a drug challenge.

## 2022-12-04 NOTE — Assessment & Plan Note (Signed)
Conjunctivitis symptoms which flares in the fall and winter.  Skin testing over 10 years ago showed multiple positives per patient report.  No prior AIT.  Sinus surgery in the past.  2 dogs and 2 cats at home. Today's skin prick testing showed: Positive to mold, cat and dog only. Get bloodwork as positive control was only borderline positive.  Start environmental control measures as below. Use over the counter antihistamines such as Zyrtec (cetirizine), Claritin (loratadine), Allegra (fexofenadine), or Xyzal (levocetirizine) daily as needed. May take twice a day during allergy flares. May switch antihistamines every few months. Use Flonase (fluticasone) nasal spray 1 spray per nostril twice a day as needed for nasal congestion.  Nasal saline spray (i.e., Simply Saline) or nasal saline lavage (i.e., NeilMed) is recommended as needed and prior to medicated nasal sprays. Use cromolyn 4% 1 drop in each eye up to four times a day as needed for itchy/watery eyes.  Start allergy injections. Will make the vials after the bloodwork is back.  Had a detailed discussion with patient/family that clinical history is suggestive of allergic rhinitis, and may benefit from allergy immunotherapy (AIT). Discussed in detail regarding the dosing, schedule, side effects (mild to moderate local allergic reaction and rarely systemic allergic reactions including anaphylaxis), and benefits (significant improvement in nasal symptoms, seasonal flares of asthma) of immunotherapy with the patient. There is significant time commitment involved with allergy shots, which includes weekly immunotherapy injections for first 9-12 months and then biweekly to monthly injections for 3-5 years.  I have prescribed epinephrine injectable and demonstrated proper use. For mild symptoms you can take over the counter antihistamines such as Benadryl and monitor symptoms closely. If symptoms worsen or if you have severe symptoms including breathing  issues, throat closure, significant swelling, whole body hives, severe diarrhea and vomiting, lightheadedness then inject epinephrine and seek immediate medical care afterwards. Emergency action plan given.

## 2022-12-05 NOTE — BH Specialist Note (Addendum)
Integrated Behavioral Health via Telemedicine Visit  12/18/2022 Rachel [name] [id number]  Number of [facility] Clinician visits: Additional Visit  Session Start time: 0630   Session End time: 1210  Total time in minutes: 84   Referring Provider: Lynnda Shields, MD Patient/Family location: [facility] Provider location: [facility] [place] [facility]  All persons participating in visit: [name] and [place]   Types of Service: Individual psychotherapy and Telephone visit  I connected with Rachel [name] [name]'s  n/a  via  Telephone or Video Enabled Telemedicine Application  (Video is Caregility application) and verified that I am speaking with the correct person using two identifiers. Discussed confidentiality: Yes   I discussed the limitations of telemedicine and the availability of in person appointments.  Discussed there is a possibility of technology failure and discussed alternative modes of communication if that failure occurs.  I discussed that engaging in this telemedicine visit, they consent to the provision of behavioral healthcare and the services will be billed under their insurance.  Patient and/or legal guardian expressed understanding and consented to Telemedicine visit: Yes   Presenting Concerns: Patient and/or family reports the following symptoms/concerns: Processing feelings regarding latest genetic testing results and current relationship issues and upcoming surgery.  Duration of problem: Ongoing; Severity of problem: moderate  Patient and/or Family's Strengths/Protective Factors: Social connections, Social and Emotional competence, Concrete supports in place (healthy food, safe environments, etc.), Sense of purpose, and Physical Health (exercise, healthy diet, medication compliance, etc.)  Goals Addressed: Patient will:  Maintain  reduction of symptoms of: anxiety, depression, and stress    Progress towards Goals: Ongoing  Interventions: Interventions utilized:  ACT (Acceptance and Commitment Therapy) Standardized Assessments completed: Not Needed  Patient and/or Family Response: Patient agrees with treatment plan.   Assessment: Patient currently experiencing PTSD, ADHD; Psychosocial stress.   Patient may benefit from continued therapeutic interventions.  Plan: Follow up with behavioral health clinician on : Two weeks Behavioral recommendations:  -Continue daily self-coping strategies to manage emotions -Continue art creations daily as able, on good health days Referral(s): [place] (In Clinic)  I discussed the assessment and treatment plan with the patient and/or parent/guardian. They were provided an opportunity to ask questions and all were answered. They agreed with the plan and demonstrated an understanding of the instructions.   They were advised to call back or seek an in-person evaluation if the symptoms worsen or if the condition fails to improve as anticipated.  [place], LCSW     11/02/2022    9:23 AM 08/02/2022    2:20 PM 08/02/2022    2:11 PM 05/02/2022    2:26 PM 03/06/2022    9:40 AM  Depression screen PHQ 2/9  Decreased Interest 0 0 0 0 0  Down, Depressed, Hopeless 1 0 0 0 0  PHQ - 2 Score 1 0 0 0 0  Altered sleeping '1 2  2   '$ Tired, decreased energy '1 2  1   '$ Change in appetite '1 1  2   '$ Feeling bad or failure about yourself  0 0  0   Trouble concentrating 0 1  1   Moving slowly or fidgety/restless 0 0  0   Suicidal thoughts 0 0  0   PHQ-9 Score '4 6  6       '$ 11/02/2022    9:24 AM 08/02/2022    2:20 PM 05/02/2022  2:26 PM 01/30/2022    9:56 AM  GAD 7 : Generalized Anxiety Score  Nervous, Anxious, on Edge 1 0 0 0  Control/stop worrying 0 0 0 0  Worry too much - different things 0 0 0 0  Trouble relaxing '1 1 2 1  '$ Restless 0 0 0 0  Easily  annoyed or irritable 0 0 1 0  Afraid - awful might happen 0 0 0 0  Total GAD 7 Score '2 1 3 '$ 1

## 2022-12-06 ENCOUNTER — Telehealth: Payer: Self-pay | Admitting: Emergency Medicine

## 2022-12-06 ENCOUNTER — Encounter: Payer: Self-pay | Admitting: Family Medicine

## 2022-12-06 LAB — LATEX, IGE: Latex IgE: <0.1 kU/L

## 2022-12-06 NOTE — Telephone Encounter (Signed)
Copied from [place] [id number]. Topic: General - Other >> Dec 06, 2022  3:35 PM Sabas Sous wrote: Reason for CRM: Pt called requesting for assistance from her PCP regarding recent lab work she had that is confusing her. She says she had this done 3 weeks ago, and is receiving confusing information on mychart.   Best contact: [phone]

## 2022-12-06 NOTE — Telephone Encounter (Signed)
Routing to PCP for review.

## 2022-12-07 NOTE — Telephone Encounter (Signed)
Patient also sent a message via MyChart and this has been addressed.

## 2022-12-12 ENCOUNTER — Other Ambulatory Visit: Payer: Self-pay | Admitting: Gastroenterology

## 2022-12-13 LAB — THYROID CASCADE PROFILE: TSH: 0.937 u[IU]/mL (ref 0.450–4.500)

## 2022-12-13 LAB — DIPHTHERIA / TETANUS ANTIBODY PANEL
Diphtheria Ab: 1.14 [IU]/mL
Tetanus Ab, IgG: 5.64 [IU]/mL

## 2022-12-13 LAB — STREP PNEUMONIAE 23 SEROTYPES IGG
Pneumo Ab Type 1*: 2.3 ug/mL (ref >1.3)
Pneumo Ab Type 12 (12F)*: 1 ug/mL — ABNORMAL LOW (ref >1.3)
Pneumo Ab Type 14*: 1.5 ug/mL (ref >1.3)
Pneumo Ab Type 17 (17F)*: 2.1 ug/mL (ref >1.3)
Pneumo Ab Type 19 (19F)*: 1.2 ug/mL — ABNORMAL LOW (ref >1.3)
Pneumo Ab Type 2*: 7.8 ug/mL (ref >1.3)
Pneumo Ab Type 20*: 7.4 ug/mL (ref >1.3)
Pneumo Ab Type 22 (22F)*: 3 ug/mL (ref >1.3)
Pneumo Ab Type 23 (23F)*: 0.8 ug/mL — ABNORMAL LOW (ref >1.3)
Pneumo Ab Type 26 (6B)*: 1.2 ug/mL — ABNORMAL LOW (ref >1.3)
Pneumo Ab Type 3*: 1.9 ug/mL (ref >1.3)
Pneumo Ab Type 34 (10A)*: 6.2 ug/mL (ref >1.3)
Pneumo Ab Type 4*: 0.2 ug/mL — ABNORMAL LOW (ref >1.3)
Pneumo Ab Type 43 (11A)*: 0.9 ug/mL — ABNORMAL LOW (ref >1.3)
Pneumo Ab Type 5*: 1.9 ug/mL (ref >1.3)
Pneumo Ab Type 51 (7F)*: 0.4 ug/mL — ABNORMAL LOW (ref >1.3)
Pneumo Ab Type 54 (15B)*: 0.5 ug/mL — ABNORMAL LOW (ref >1.3)
Pneumo Ab Type 56 (18C)*: >8.1 ug/mL (ref >1.3)
Pneumo Ab Type 57 (19A)*: 3.5 ug/mL (ref >1.3)
Pneumo Ab Type 68 (9V)*: 0.8 ug/mL — ABNORMAL LOW (ref >1.3)
Pneumo Ab Type 70 (33F)*: >10.1 ug/mL (ref >1.3)
Pneumo Ab Type 8*: 15.2 ug/mL (ref >1.3)
Pneumo Ab Type 9 (9N)*: 5.8 ug/mL (ref >1.3)

## 2022-12-13 LAB — CBC WITH DIFFERENTIAL/PLATELET
Basophils Absolute: 0.1 10*3/uL (ref 0.0–0.2)
Basos: 1 %
EOS (ABSOLUTE): 0.5 10*3/uL — ABNORMAL HIGH (ref 0.0–0.4)
Eos: 7 %
Hematocrit: 40.4 % (ref 34.0–46.6)
Hemoglobin: 13.2 g/dL (ref 11.1–15.9)
Immature Grans (Abs): 0 10*3/uL (ref 0.0–0.1)
Immature Granulocytes: 0 %
Lymphocytes Absolute: 2.3 10*3/uL (ref 0.7–3.1)
Lymphs: 36 %
MCH: 30.2 pg (ref 26.6–33.0)
MCHC: 32.7 g/dL (ref 31.5–35.7)
MCV: 92 fL (ref 79–97)
Monocytes Absolute: 0.4 10*3/uL (ref 0.1–0.9)
Monocytes: 7 %
Neutrophils Absolute: 3.2 10*3/uL (ref 1.4–7.0)
Neutrophils: 49 %
Platelets: 221 10*3/uL (ref 150–450)
RBC: 4.37 x10E6/uL (ref 3.77–5.28)
RDW: 12.5 % (ref 11.7–15.4)
WBC: 6.5 10*3/uL (ref 3.4–10.8)

## 2022-12-13 LAB — COMPREHENSIVE METABOLIC PANEL WITH GFR
ALT: 7 [IU]/L (ref 0–32)
AST: 13 [IU]/L (ref 0–40)
Albumin/Globulin Ratio: 1.6 (ref 1.2–2.2)
Albumin: 4.4 g/dL (ref 3.9–4.9)
Alkaline Phosphatase: 80 [IU]/L (ref 44–121)
BUN/Creatinine Ratio: 18 (ref 9–23)
BUN: 12 mg/dL (ref 6–20)
Bilirubin Total: <0.2 mg/dL (ref 0.0–1.2)
CO2: 22 mmol/L (ref 20–29)
Calcium: 9.9 mg/dL (ref 8.7–10.2)
Chloride: 105 mmol/L (ref 96–106)
Creatinine, Ser: 0.66 mg/dL (ref 0.57–1.00)
Globulin, Total: 2.7 g/dL (ref 1.5–4.5)
Glucose: 88 mg/dL (ref 70–99)
Potassium: 4.4 mmol/L (ref 3.5–5.2)
Sodium: 141 mmol/L (ref 134–144)
Total Protein: 7.1 g/dL (ref 6.0–8.5)
eGFR: 117 mL/min/{1.73_m2}

## 2022-12-13 LAB — ALLERGENS W/TOTAL IGE AREA 2
Alternaria Alternata IgE: 0.11 kU/L — AB
Aspergillus Fumigatus IgE: <0.1 kU/L
Bermuda Grass IgE: <0.1 kU/L
Cat Dander IgE: 7.4 kU/L — AB
Cedar, Mountain IgE: <0.1 kU/L
Cladosporium Herbarum IgE: <0.1 kU/L
Cockroach, German IgE: <0.1 kU/L
Common Silver Birch IgE: <0.1 kU/L
Cottonwood IgE: <0.1 kU/L
D Farinae IgE: <0.1 kU/L
D Pteronyssinus IgE: <0.1 kU/L
Dog Dander IgE: 1.42 kU/L — AB
Elm, American IgE: <0.1 kU/L
Johnson Grass IgE: <0.1 kU/L
Maple/Box Elder IgE: <0.1 kU/L
Mouse Urine IgE: <0.1 kU/L
Oak, White IgE: <0.1 kU/L
Pecan, Hickory IgE: <0.1 kU/L
Penicillium Chrysogen IgE: <0.1 kU/L
Pigweed, Rough IgE: <0.1 kU/L
Ragweed, Short IgE: <0.1 kU/L
Sheep Sorrel IgE Qn: <0.1 kU/L
Timothy Grass IgE: <0.1 kU/L
White Mulberry IgE: <0.1 kU/L

## 2022-12-13 LAB — IGG, IGA, IGM
IgG (Immunoglobin G), Serum: 1143 mg/dL (ref 586–1602)
IgM (Immunoglobulin M), Srm: 112 mg/dL (ref 26–217)
Immunoglobulin A, (IgA) QN, Serum: 214 mg/dL (ref 87–352)

## 2022-12-13 LAB — ALPHA-GAL PANEL
Allergen Lamb IgE: <0.1 kU/L
Beef IgE: <0.1 kU/L
IgE (Immunoglobulin E), Serum: 26 IU/mL (ref 6–495)
O215-IgE Alpha-Gal: <0.1 kU/L
Pork IgE: <0.1 kU/L

## 2022-12-13 LAB — CHRONIC URTICARIA: cu index: 7.9 (ref <10)

## 2022-12-13 LAB — C3 AND C4
Complement C3, Serum: 115 mg/dL (ref 82–167)
Complement C4, Serum: 19 mg/dL (ref 12–38)

## 2022-12-13 LAB — ANA W/REFLEX: Anti Nuclear Antibody (ANA): NEGATIVE

## 2022-12-13 LAB — COMPLEMENT, TOTAL: Compl, Total (CH50): >60 U/mL

## 2022-12-13 LAB — TRYPTASE: Tryptase: 5.7 ug/L (ref 2.2–13.2)

## 2022-12-13 LAB — SEDIMENTATION RATE: Sed Rate: 12 mm/hr (ref 0–32)

## 2022-12-13 LAB — C-REACTIVE PROTEIN: CRP: <1 mg/L (ref 0–10)

## 2022-12-13 NOTE — Progress Notes (Signed)
Please call patient.  I reviewed the bloodwork. Blood count, kidney function, liver function, electrolytes, thyroid, autoimmune screener, inflammation markers, chronic urticaria index (checks for autoantibodies that trigger mast cells), tryptase (checks for mast cell issues) and alpha gal (checks for red meat allergy) were all normal which is great.   Environmental panel positive to cat and dog. Does she still want to do the allergy shots? Then I will put in order to mix.  I wonder if the itchy rash may be flared by the above allergens.  Latex was negative. Continue to avoid though due to your past clinical history.  Your immunoglobulin levels were normal You also have good protection against diptheria and tetanus. Your pneumococcal titers were adequate. Sometimes people with low titers are more likely to develop respiratory infections caused by the bacteria strep pneumoniae.  No immunodeficiency based on these results. Keep track of infections and antibiotics use.

## 2022-12-14 ENCOUNTER — Other Ambulatory Visit: Payer: Self-pay | Admitting: Allergy

## 2022-12-14 DIAGNOSIS — J3089 Other allergic rhinitis: Secondary | ICD-10-CM

## 2022-12-14 NOTE — Progress Notes (Signed)
I called the patent and scheduled her to start allergy injections in [place].

## 2022-12-14 NOTE — Progress Notes (Unsigned)
Please call patient and have her schedule for first shot appointment.  Placed orders for AIT.  Thank you.

## 2022-12-15 ENCOUNTER — Telehealth: Payer: Self-pay | Admitting: Emergency Medicine

## 2022-12-15 NOTE — Telephone Encounter (Signed)
Patient called and she states that she has received an email about her results being ready. I informed her to open the results and to screenshot or upload results to her mychart. Patient said that she will upload the results.

## 2022-12-15 NOTE — Telephone Encounter (Signed)
Copied from [place] [id number]. Topic: General - Inquiry >> Dec 15, 2022  9:13 AM Penni Bombard wrote: Reason for CRM: pt called asking if Dr. Margarita Rana or her nurse would call her back about the genetic testing.  CB#  [phone]

## 2022-12-18 ENCOUNTER — Ambulatory Visit (INDEPENDENT_AMBULATORY_CARE_PROVIDER_SITE_OTHER): Payer: Medicaid Other | Admitting: Clinical

## 2022-12-18 DIAGNOSIS — J3081 Allergic rhinitis due to animal (cat) (dog) hair and dander: Secondary | ICD-10-CM | POA: Diagnosis not present

## 2022-12-18 DIAGNOSIS — F4310 Post-traumatic stress disorder, unspecified: Secondary | ICD-10-CM

## 2022-12-18 DIAGNOSIS — F909 Attention-deficit hyperactivity disorder, unspecified type: Secondary | ICD-10-CM

## 2022-12-18 DIAGNOSIS — Z658 Other specified problems related to psychosocial circumstances: Secondary | ICD-10-CM

## 2022-12-18 NOTE — Progress Notes (Signed)
Aeroallergen Immunotherapy   Ordering Provider: Dr. Rexene Alberts   Patient Details  Name: Rachel [name]  MRN: [id number]  Date of Birth: Jan 12, 1986   Order 1 of 1   Vial Label: M-C-D   0.2 ml (Volume)  1:20 Concentration -- Alternaria alternata  0.5 ml (Volume)  1:10 Concentration -- Cat Hair  0.5 ml (Volume)  1:10 Concentration -- Dog Epithelia    1.2  ml Extract Subtotal  3.8  ml Diluent  5.0  ml Maintenance Total   Schedule:  B  Blue Vial (1:100,000): Schedule B (6 doses)  Yellow Vial (1:10,000): Schedule B (6 doses)  Green Vial (1:1,000): Schedule B (6 doses)  Red Vial (1:100): Schedule A (14 doses)   Special Instructions: once per week

## 2022-12-18 NOTE — Progress Notes (Signed)
VIALS EXP 12-19-23

## 2022-12-18 NOTE — Telephone Encounter (Signed)
Would be great as I have checked her chart and I am not able to access results.

## 2022-12-20 ENCOUNTER — Encounter: Payer: Self-pay | Admitting: Family Medicine
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
## 2022-12-25 ENCOUNTER — Encounter: Payer: Self-pay | Admitting: Neurology

## 2022-12-25 ENCOUNTER — Ambulatory Visit: Payer: Medicaid Other | Admitting: Neurology

## 2022-12-25 VITALS — BP 112/77 | HR 66 | Ht 62.6 in | Wt 111.5 lb

## 2022-12-25 DIAGNOSIS — R202 Paresthesia of skin: Secondary | ICD-10-CM | POA: Diagnosis not present

## 2022-12-25 DIAGNOSIS — R413 Other amnesia: Secondary | ICD-10-CM | POA: Diagnosis not present

## 2022-12-25 NOTE — Progress Notes (Addendum)
Chief Complaint  Patient presents with   New Patient (Initial Visit)    Rm 15. Accompanied by mom. NX Willis/ Internal referral for brain fog, brain/body disconnect, forgetting words.      ASSESSMENT AND PLAN  Rachel [name] is a 37 y.o. female   Brain fog Multiple joint pain, muscle achy pain, neck pain radiating pain to left upper extremity,  Variable effort on examinations, split tuning fork at midline,  Complete evaluation with MRI of brain, EMG nerve conduction study  If above testing showed no significant structural abnormality, less likely due to true neurological condition, may continue follow-up with her primary care and psychiatrist  DIAGNOSTIC DATA (LABS, IMAGING, TESTING) - I reviewed patient records, labs, notes, testing and imaging myself where available.   MEDICAL HISTORY:  Rachel [name], is a 37 year old female, accompanied by her mother, seen in request by primary care physician Dr. Margarita Rana, Charlane Ferretti, for evaluation of brain fog, left upper extremity paresthesia weakness  I reviewed and summarized the referring note.PMHX. Bipolar, ADHD,  HLD  She had long history of mood disorder, went on disability since age 78, completed high school, had a few months of college, over the years, she suffered multiple joints pain, hyperflexibility, muscle achy pain, also reported gastric process, malnutrition  Since 2020, she reported brain fog, difficult to gather her thoughts together, mild short-term difficulties, MoCA examination 25/30 today, missed 2 out of 5 recalls, has difficulty with attention  Also complains of neck tension, radiating discomfort to left shoulder, left arm paresthesia, subjective weakness,    PHYSICAL EXAM:   Vitals:   12/25/22 0819  BP: 112/77  Pulse: 66  Weight: 111 lb 8 oz (50.6 kg)  Height: 5' 2.6" (1.59 m)      Body mass index is 20 kg/m.  PHYSICAL EXAMNIATION:  Gen: NAD, conversant, well nourised, well  groomed                     Cardiovascular: Regular rate rhythm, no peripheral edema, warm, nontender. Eyes: Conjunctivae clear without exudates or hemorrhage Neck: Supple, no carotid bruits. Pulmonary: Clear to auscultation bilaterally   NEUROLOGICAL EXAM:  MENTAL STATUS: Speech/cognition: Awake, alert, oriented to history taking and casual conversation     12/25/2022    8:20 AM  Montreal Cognitive Assessment   Visuospatial/ Executive (0/5) 5  Naming (0/3) 3  Attention: Read list of digits (0/2) 0  Attention: Read list of letters (0/1) 1  Attention: Serial 7 subtraction starting at 100 (0/3) 3  Language: Repeat phrase (0/2) 1  Language : Fluency (0/1) 1  Abstraction (0/2) 2  Delayed Recall (0/5) 3  Orientation (0/6) 6  Total 25  Adjusted Score (based on education) 25    CRANIAL NERVES: CN II: Visual fields are full to confrontation. Pupils are round equal and briskly reactive to light. CN III, IV, VI: extraocular movement are normal. No ptosis. CN V: Facial sensation is intact to light touch CN VII: Face is symmetric with normal eye closure  CN VIII: Hearing is normal to causal conversation. CN IX, X: Phonation is normal. CN XI: Head turning and shoulder shrug are intact  MOTOR: Variable effort on examination, with encouragement, felt there was no significant bilateral upper or lower extremity muscle weakness  REFLEXES: Reflexes are 2+ and symmetric at the biceps, triceps, knees, and ankles. Plantar responses are flexor.  SENSORY: Intact to light touch, pinprick and vibratory sensation are intact in fingers and toes.  COORDINATION: There  is no trunk or limb dysmetria noted.  GAIT/STANCE: Posture is normal. Gait is steady with normal steps, base, arm swing, and turning. Tandem gait is cautious   REVIEW OF SYSTEMS:  Full 14 system review of systems performed and notable only for as above All other review of systems were negative.   ALLERGIES: Allergies   Allergen Reactions   Amitriptyline Anaphylaxis   Amphetamine-Dextroamphetamine Other (See Comments)    Triggered bipolar, caused depression, and suicidal thoughts   Lupron [Leuprolide] Anaphylaxis   Orilissa [Elagolix] Anaphylaxis   Penicillins Anaphylaxis, Swelling and Other (See Comments)    Potential for anaphylaxis confirmed. Tolerates Cefphalosporins No "-cillins"!!!! Did it involve swelling of the face/tongue/throat, SOB, or low BP? Yes Did it involve sudden or severe rash/hives, skin peeling, or any reaction on the inside of your mouth or nose? Unknown Did you need to seek medical attention at a hospital or doctor's office? Unknown When did it last happen? teenager      If all above answers are "NO", may proceed with cephalosporin use.   Chlorhexidine Hives    DuraPrep    Doxycycline Itching, Nausea And Vomiting and Other (See Comments)    Caused high fever, also   Mixed Ragweed Other (See Comments)    Unknown, Mother and pt state she can't have    Olanzapine Other (See Comments)    Caused mania   Other Other (See Comments)    Stimulants, mood stabilizers, and antidepressants = Triggered bipolar, caused depression, and suicidal thoughts   Prozac [Fluoxetine Hcl] Other (See Comments)    Triggered bipolar, caused depression, and suicidal thoughts   Stevia Glycerite Extract [Flavoring Agent] Other (See Comments)    Unknown reaction; mother and pt confirm any Artificial Sweeteners cause reaction   Tape Other (See Comments)    Paper tape is tolerated   Triple Antibiotic Pain Relief [Neomy-Bacit-Polymyx-Pramoxine] Hives   Reglan [Metoclopramide] Anxiety and Other (See Comments)    Triggered bipolar, caused depression, suicidal thoughts, muscle twitching, stiffness, and high anxiety     HOME MEDICATIONS: Current Outpatient Medications  Medication Sig Dispense Refill   acetaminophen (TYLENOL) 500 MG tablet Take by mouth.     albuterol (PROVENTIL) (2.5 MG/3ML) 0.083% nebulizer  solution Take 3 mLs (2.5 mg total) by nebulization every 6 (six) hours as needed for wheezing or shortness of breath. Max 4 doses per day 75 mL 0   albuterol (VENTOLIN HFA) 108 (90 Base) MCG/ACT inhaler Inhale 1-2 puffs into the lungs every 6 (six) hours as needed for wheezing or shortness of breath. 8 g 1   Ascorbic Acid (VITAMIN C) 1000 MG tablet Take 1,000 mg by mouth 2 (two) times daily.     atorvastatin (LIPITOR) 20 MG tablet TAKE 1 TABLET BY MOUTH DAILY 90 tablet 0   B Complex Vitamins (B COMPLEX PO) Take 1 capsule by mouth daily.     Calcium-Magnesium-Vitamin D 600-40-500 MG-MG-UNIT TB24 Take 3 tablets by mouth daily.     Cholecalciferol (VITAMIN D3) 50 MCG (2000 UT) TABS Take 2,000 Units by mouth daily.     cromolyn (OPTICROM) 4 % ophthalmic solution Place 1 drop into both eyes 4 (four) times daily as needed (itchy/watery eyes). 10 mL 3   ELDERBERRY PO Take 1 tablet by mouth daily.     EPINEPHrine 0.3 mg/0.3 mL IJ SOAJ injection Inject 0.3 mg into the muscle as needed for anaphylaxis. 2 each 1   estrogens, conjugated, (PREMARIN) 0.625 MG tablet Take 1 tablet (0.625 mg total) by  mouth daily. Take daily for 21 days then do not take for 7 days. 30 tablet 11   fexofenadine (ALLEGRA) 180 MG tablet Take 180 mg by mouth daily.     fluticasone (FLONASE) 50 MCG/ACT nasal spray Place 1 spray into both nostrils 2 (two) times daily as needed (nasal congestion). 16 g 3   Iron, Ferrous Sulfate, 325 (65 Fe) MG TABS Take 325 mg by mouth daily. 60 tablet 2   ketorolac (ACULAR) 0.5 % ophthalmic solution Place 1 drop into the right eye 4 (four) times daily. 3 mL 0   LINZESS 290 MCG CAPS capsule TAKE 1 CAPSULE BY MOUTH DAILY BEFORE BREAKFAST 30 capsule 0   Melatonin 10 MG TABS Take 10 mg by mouth at bedtime.     MIRALAX 17 GM/SCOOP powder Take 17 g by mouth See admin instructions. Mix 17 g of powder into water and drink once a day     Multiple Vitamins-Minerals (ADULT ONE DAILY GUMMIES PO) Take 2 tablets by  mouth daily.     Omega-3 Fatty Acids (FISH OIL) 1200 MG CAPS Take 1,200 mg by mouth daily.     promethazine (PHENERGAN) 12.5 MG tablet Take 12.5 mg by mouth See admin instructions. Take 12.5 mg by mouth every 6-8 hours as needed for nausea     sennosides-docusate sodium (SENOKOT-S) 8.6-50 MG tablet Take 1-2 tablets by mouth daily as needed for constipation.     sulfacetamide (BLEPH-10) 10 % ophthalmic solution Place 1-2 drops into the right eye every 3 (three) hours. Use while awake 5 mL 0   triamcinolone cream (KENALOG) 0.1 % APPLY TO AFFECTED AREA(S) TWO TIMES A DAY (Patient taking differently: Apply 1 Application topically 2 (two) times daily as needed (rash, itching).) 45 g 1   VITAMIN A PO Take 1 capsule by mouth daily.     No current facility-administered medications for this visit.    PAST MEDICAL HISTORY: Past Medical History:  Diagnosis Date   ADHD (attention deficit hyperactivity disorder)    Anemia    Anxiety    Asthma    Bipolar disorder ([place])    Complication of anesthesia    slow to wake up    Delayed gastric emptying 02/23/2020   Depression    DUB (dysfunctional uterine bleeding) 02/23/2020   Eczema    Ehlers-Danlos syndrome    Endometriosis    Gastroparesis    GERD (gastroesophageal reflux disease)    Heart murmur    Hiatal hernia 04/06/2020   Myalgia 04/06/2020   Nausea & vomiting 09/22/2022   Parasitic infection 02/23/2020   Pneumonia    Poor appetite 02/23/2020   Recurrent upper respiratory infection (URI)    RUQ pain 02/23/2020   Vitamin D deficiency    Weight loss 02/23/2020    PAST SURGICAL HISTORY: Past Surgical History:  Procedure Laterality Date   barium study     COLONOSCOPY     CYSTOSCOPY N/A 07/12/2021   Procedure: CYSTOSCOPY;  Surgeon: [name], MD;  Location: [place];  Service: Gynecology;  Laterality: N/A;   ESOPHAGOGASTRODUODENOSCOPY  2011   and a ph study as well.    LAPAROSCOPY N/A 01/26/2021   Procedure: LAPAROSCOPY  DIAGNOSTIC;  Surgeon: [name], MD;  Location: [facility];  Service: Gynecology;  Laterality: N/A;   NASAL SEPTUM SURGERY     SINOSCOPY     TONGUE FLAP     TOTAL LAPAROSCOPIC HYSTERECTOMY WITH SALPINGECTOMY Bilateral 07/12/2021   Procedure: TOTAL LAPAROSCOPIC HYSTERECTOMY WITH  SALPINGECTOMY, OOPHORECTOMY;  Surgeon: [name], MD;  Location: [facility];  Service: Gynecology;  Laterality: Bilateral;   UPPER GASTROINTESTINAL ENDOSCOPY     WISDOM TOOTH EXTRACTION      FAMILY HISTORY: Family History  Problem Relation Age of Onset   Eczema Mother    Urticaria Mother    Allergic rhinitis Mother    Colon polyps Mother    Depression Mother    Anxiety disorder Mother    Hypertension Mother    Hyperlipidemia Mother    Asthma Mother    Endometriosis Mother    Fibroids Mother    Fibromyalgia Mother    Migraines Mother    Irritable bowel syndrome Mother    Allergic rhinitis Father    Depression Father    Hypertension Father    Hyperlipidemia Father    Bipolar disorder Father    Urticaria Sister    Allergic rhinitis Sister    Bipolar disorder Sister    ADD / ADHD Sister    Asthma Sister    Endometriosis Sister    Seizures Sister    Bipolar disorder Sister    Anxiety disorder Sister    Heart disease Maternal Grandmother    Endometriosis Maternal Grandmother    Fibroids Maternal Grandmother    Hyperlipidemia Maternal Grandmother    Hypertension Maternal Grandmother    Diabetes Maternal Grandmother    Fibromyalgia Maternal Grandmother    Heart disease Maternal Grandfather    Diabetes Maternal Grandfather    Heart disease Paternal Grandmother    Heart disease Paternal Grandfather    Colon cancer Maternal Great-grandmother    Pancreatic cancer Neg Hx    Esophageal cancer Neg Hx    Stomach cancer Neg Hx     SOCIAL HISTORY: Social History   Socioeconomic History   Marital status: Divorced    Spouse name: Not on file   Number of children: Not on file    Years of education: Not on file   Highest education level: Not on file  Occupational History   Not on file  Tobacco Use   Smoking status: Never    Passive exposure: Never   Smokeless tobacco: Never  Vaping Use   Vaping Use: Never used  Substance and Sexual Activity   Alcohol use: Yes    Alcohol/week: 1.0 standard drink of alcohol    Types: 1 Glasses of wine per week    Comment: 1 glass of wine every other week   Drug use: No   Sexual activity: Not Currently  Other Topics Concern   Not on file  Social History Narrative   Not on file   Social Determinants of Health   Financial Resource Strain: Not on file  Food Insecurity: Food Insecurity Present (09/22/2022)   Hunger Vital Sign    Worried About Running Out of Food in the Last Year: Often true    Ran Out of Food in the Last Year: Often true  Transportation Needs: No Transportation Needs (09/22/2022)   PRAPARE - Hydrologist (Medical): No    Lack of Transportation (Non-Medical): No  Physical Activity: Not on file  Stress: Not on file  Social Connections: Not on file  Intimate Partner Violence: Not on file      Marcial Pacas, M.D. Ph.D.  [facility] [address], [place], [place] [address] Ph: [phone] Fax: [phone]  CC:  Charlott Rakes, MD [address] [place],   [id number]  Margarita Rana,  Charlane Ferretti, MD

## 2022-12-27 ENCOUNTER — Telehealth: Payer: Self-pay | Admitting: Neurology

## 2022-12-27 NOTE — Telephone Encounter (Signed)
healthy blue Josem Kaufmann: [id number] exp. 12/27/22-02/24/23 sent to GI [phone]

## 2022-12-28 ENCOUNTER — Telehealth: Payer: Self-pay | Admitting: Neurology

## 2022-12-28 NOTE — Telephone Encounter (Signed)
Has patient been scheduled yet by chance?

## 2022-12-28 NOTE — Telephone Encounter (Signed)
Pt is calling. Stated she is seeing a weird flashing light almost like a rainbow. Stating she is having headaches and her lip is tickling. And stated when these spells come she is really weak, Pt is requesting a call from nurse.

## 2023-01-03 ENCOUNTER — Ambulatory Visit (INDEPENDENT_AMBULATORY_CARE_PROVIDER_SITE_OTHER): Payer: Medicaid Other | Admitting: Clinical

## 2023-01-03 DIAGNOSIS — F4310 Post-traumatic stress disorder, unspecified: Secondary | ICD-10-CM

## 2023-01-03 DIAGNOSIS — F909 Attention-deficit hyperactivity disorder, unspecified type: Secondary | ICD-10-CM | POA: Diagnosis not present

## 2023-01-03 DIAGNOSIS — Z658 Other specified problems related to psychosocial circumstances: Secondary | ICD-10-CM

## 2023-01-03 NOTE — Patient Instructions (Signed)
[facility] [place], [place] [address] [phone] (main office) [phone] ([place] office)  [email or web address]

## 2023-01-04 ENCOUNTER — Ambulatory Visit: Payer: Self-pay

## 2023-01-04 ENCOUNTER — Ambulatory Visit (INDEPENDENT_AMBULATORY_CARE_PROVIDER_SITE_OTHER): Payer: Medicaid Other

## 2023-01-04 DIAGNOSIS — J309 Allergic rhinitis, unspecified: Secondary | ICD-10-CM | POA: Diagnosis not present

## 2023-01-04 NOTE — Progress Notes (Signed)
Immunotherapy   Patient Details  Name: Rachel [name] MRN: [id number] Date of Birth: May 27, 1986  01/04/2023  Rachel [name] started injections for   M-C-D Following schedule: B  Frequency:1 time per week Epi-Pen:Epi-Pen Available  Consent signed and patient instructions given.  Patient signed consent forms today   Larence Penning 01/04/2023, 11:03 AM

## 2023-01-08 NOTE — BH Specialist Note (Addendum)
Integrated Behavioral Health via Telemedicine Visit  01/18/2023 Rachel [name] [id number]  Number of Integrated Behavioral Health Clinician visits: Additional Visit  Session Start time: 1047   Session End time: 1216  Total time in minutes: 89   Referring Provider: Mariel Aloe, MD Patient/Family location: Home [facility] Provider location: Center for [facility] at [facility]  All persons participating in visit: [name] and [facility] Rachel [name]   Types of Service: Individual psychotherapy and Telephone visit  I connected with Rachel [name] [name]  n/a  via  Telephone or Video Enabled Telemedicine Application  (Video is Caregility application) and verified that I am speaking with the correct person using two identifiers. Discussed confidentiality: Yes   I discussed the limitations of telemedicine and the availability of in person appointments.  Discussed there is a possibility of technology failure and discussed alternative modes of communication if that failure occurs.  I discussed that engaging in this telemedicine visit, they consent to the provision of behavioral healthcare and the services will be billed under their insurance.  Patient and/or legal guardian expressed understanding and consented to Telemedicine visit: Yes   Presenting Concerns:  Patient and/or family reports the following symptoms/concerns: Processing current thoughts, feelings, reactions to words and actions from significant other and family; ongoing financial stress being unable to work and having to be dependent on others.  Duration of problem: Ongoing; Severity of problem: moderate  Patient and/or Family's Strengths/Protective Factors: Social connections, Concrete supports in place (healthy food, safe environments, etc.), and Sense of purpose  Goals Addressed: Patient will:  Reduce symptoms of: anxiety,  depression, and stress    Demonstrate ability to: Increase healthy adjustment to current life circumstances  Progress towards Goals: Ongoing  Interventions: Interventions utilized:  Supportive Reflection Standardized Assessments completed: Not Needed  Patient and/or Family Response: Patient agrees with treatment plan.   Assessment: Patient currently experiencing PTSD, ADHD; Psychosocial stress.   Patient may benefit from continued therapeutic interventions.  Plan: Follow up with behavioral health clinician on : Two weeks Behavioral recommendations:  -Continue daily self-coping strategies to manage emotions -Continue plan to attend upcoming scheduled surgery; accept practical support from others during recovery time -Look over resources on After Visit Summary; use as needed to get disability claim started Referral(s): Integrated Art gallery manager (In Clinic) and Community Resources:  [employer]I Disability  I discussed the assessment and treatment plan with the patient and/or parent/guardian. They were provided an opportunity to ask questions and all were answered. They agreed with the plan and demonstrated an understanding of the instructions.   They were advised to call back or seek an in-person evaluation if the symptoms worsen or if the condition fails to improve as anticipated.  [name] [name], LCSW     11/02/2022    9:23 AM 08/02/2022    2:20 PM 08/02/2022    2:11 PM 05/02/2022    2:26 PM 03/06/2022    9:40 AM  Depression screen PHQ 2/9  Decreased Interest 0 0 0 0 0  Down, Depressed, Hopeless 1 0 0 0 0  PHQ - 2 Score 1 0 0 0 0  Altered sleeping 1 2  2    Tired, decreased energy 1 2  1    Change in appetite 1 1  2    Feeling bad or failure about yourself  0 0  0   Trouble concentrating 0 1  1   Moving slowly or fidgety/restless 0 0  0  Suicidal thoughts 0 0  0   PHQ-9 Score 4 6  6        11/02/2022    9:24 AM 08/02/2022    2:20 PM 05/02/2022    2:26 PM 01/30/2022     9:56 AM  GAD 7 : Generalized Anxiety Score  Nervous, Anxious, on Edge 1 0 0 0  Control/stop worrying 0 0 0 0  Worry too much - different things 0 0 0 0  Trouble relaxing 1 1 2 1   Restless 0 0 0 0  Easily annoyed or irritable 0 0 1 0  Afraid - awful might happen 0 0 0 0  Total GAD 7 Score 2 1 3  1

## 2023-01-09 ENCOUNTER — Ambulatory Visit (INDEPENDENT_AMBULATORY_CARE_PROVIDER_SITE_OTHER): Payer: Medicaid Other

## 2023-01-09 DIAGNOSIS — J309 Allergic rhinitis, unspecified: Secondary | ICD-10-CM

## 2023-01-10 NOTE — Progress Notes (Signed)
Sent message, via epic in basket, requesting orders in epic from surgeon.  

## 2023-01-11 ENCOUNTER — Ambulatory Visit: Payer: Self-pay | Admitting: Surgery

## 2023-01-13 ENCOUNTER — Ambulatory Visit
Admission: RE | Admit: 2023-01-13 | Discharge: 2023-01-13 | Disposition: A | Discharge status: Home / Self Care | Payer: Medicaid Other | Source: Ambulatory Visit | Attending: Neurology | Admitting: Neurology

## 2023-01-13 DIAGNOSIS — R413 Other amnesia: Secondary | ICD-10-CM | POA: Diagnosis not present

## 2023-01-13 DIAGNOSIS — R202 Paresthesia of skin: Secondary | ICD-10-CM | POA: Diagnosis not present

## 2023-01-14 ENCOUNTER — Other Ambulatory Visit: Payer: Self-pay | Admitting: Gastroenterology

## 2023-01-16 NOTE — Progress Notes (Signed)
COVID Vaccine received:  '[]'$  No '[x]'$  Yes Date of any COVID positive Test in last 90 days:  PCP - Charlott Rakes, MD Cardiologist - Rudean Haskell, MD Neurology- Essie Christine, MD Chest x-ray - 11-02-2022  2v  epic EKG -  07-26-2022  epic Stress Test -  ECHO - 08-20-2020  epic Cardiac Cath -  Cardiac Monitor- 08-23-2020  epic  PCR screen: '[]'$  Ordered & Completed                      '[]'$   No Order but Needs PROFEND                      '[x]'$   N/A for this surgery  Surgery Plan:  '[x]'$  Ambulatory                            '[]'$  Outpatient in bed                            '[]'$  Admit  Anesthesia:    '[x]'$  General  '[]'$  Spinal                           '[]'$   Choice '[]'$   MAC  Bowel Prep - '[]'$  No  '[]'$   Yes ______  Pacemaker / ICD device '[x]'$  No '[]'$  Yes        Device order form faxed '[x]'$  No    '[]'$   Yes      Faxed to:  Spinal Cord Stimulator:'[x]'$  No '[]'$  Yes      (Remind patient to bring remote DOS) Other Implants:   History of Sleep Apnea? '[x]'$  No '[]'$  Yes   CPAP used?- '[x]'$  No '[]'$  Yes    Does the patient monitor blood sugar? '[]'$  No '[]'$  Yes  '[x]'$  N/A  Patient has: '[]'$  Pre-DM   '[]'$  DM1  '[]'$   DM2 Does patient have a [employer] or Dexacom? '[]'$  No '[]'$  Yes   Fasting Blood Sugar Ranges-  Checks Blood Sugar _____ times a day  Blood Thinner / Instructions: none Aspirin Instructions: none  ERAS Protocol Ordered: '[]'$  No  '[x]'$  Yes PRE-SURGERY '[]'$  ENSURE  '[]'$  G2  '[x]'$  No Drink Ordered Patient is to be NPO after: 10:30 am  Comments:   Activity level: Patient is able / unable to climb a flight of stairs without difficulty; '[]'$  No CP  '[]'$  No SOB, but would have ___   Patient can / can not perform ADLs without assistance.   Anesthesia review: ADHD, Bipolar, GeRD, NSVT, Heart Murmur, asthma, anemia, "slow to wake up"  Patient denies shortness of breath, fever, cough and chest pain at PAT appointment.  Patient verbalized understanding and agreement to the Pre-Surgical Instructions that were given to them at this PAT  appointment. Patient was also educated of the need to review these PAT instructions again prior to his/her surgery.I reviewed the appropriate phone numbers to call if they have any and questions or concerns.

## 2023-01-16 NOTE — Patient Instructions (Signed)
SURGICAL WAITING ROOM VISITATION Patients having surgery or a procedure may have no more than 2 support people in the waiting area - these visitors may rotate in the visitor waiting room.   Due to an increase in RSV and influenza rates and associated hospitalizations, children ages 62 and under may not visit patients in [place]. If the patient needs to stay at the hospital during part of their recovery, the visitor guidelines for inpatient rooms apply.  PRE-OP VISITATION  Pre-op nurse will coordinate an appropriate time for 1 support person to accompany the patient in pre-op.  This support person may not rotate.  This visitor will be contacted when the time is appropriate for the visitor to come back in the pre-op area.  Please refer to the [facility] website for the visitor guidelines for Inpatients (after your surgery is over and you are in a regular room).  You are not required to quarantine at this time prior to your surgery. However, you must do this: Hand Hygiene often Do NOT share personal items Notify your provider if you are in close contact with someone who has COVID or you develop fever 100.4 or greater, new onset of sneezing, cough, sore throat, shortness of breath or body aches.  If you test positive for Covid or have been in contact with anyone that has tested positive in the last 10 days please notify you surgeon.    Your procedure is scheduled on:  Monday  January 22, 2023  Report to [facility] Main Entrance: [place] entrance where the [employer] is available.   Report to admitting at: 11:15  AM  +++++Call this number if you have any questions or problems the morning of surgery [phone]  Do not eat food after Midnight the night prior to your surgery/procedure.  After Midnight you may have the following liquids until   10:30 AM DAY OF SURGERY  Clear Liquid Diet Water Black Coffee (sugar ok, NO MILK/CREAM OR CREAMERS)  Tea (sugar ok, NO  MILK/CREAM OR CREAMERS) regular and decaf                             Plain Jell-O  with no fruit (NO RED)                                           Fruit ices (not with fruit pulp, NO RED)                                     Popsicles (NO RED)                                                                  Juice: apple, WHITE grape, WHITE cranberry Sports drinks like Gatorade or Powerade (NO RED)             FOLLOW BOWEL PREP AND ANY ADDITIONAL PRE OP INSTRUCTIONS YOU RECEIVED FROM YOUR SURGEON'S OFFICE!!!   Oral Hygiene is also important to reduce your risk of infection.  Remember - BRUSH YOUR TEETH THE MORNING OF SURGERY WITH YOUR REGULAR TOOTHPASTE  Do NOT smoke after Midnight the night before surgery.  Take ONLY these medicines the morning of surgery with A SIP OF WATER:  none,   You may use your Eye drops and your Albuterol if needed                   You may not have any metal on your body including hair pins, jewelry, and body piercing  Do not wear make-up, lotions, powders, perfumes or deodorant  Do not wear nail polish including gel and S&S, artificial / acrylic nails, or any other type of covering on natural nails including finger and toenails. If you have artificial nails, gel coating, etc., that needs to be removed by a nail salon, Please have this removed prior to surgery. Not doing so may mean that your surgery could be cancelled or delayed if the Surgeon or anesthesia staff feels like they are unable to monitor you safely.   Do not shave 48 hours prior to surgery to avoid nicks in your skin which may contribute to postoperative infections.   Contacts, Hearing Aids, dentures or bridgework may not be worn into surgery  Patients discharged on the day of surgery will not be allowed to drive home.  Someone NEEDS to stay with you for the first 24 hours after anesthesia.  Do not bring your home medications to the hospital. The Pharmacy will dispense medications listed on  your medication list to you during your admission in the Hospital.  Please read over the following fact sheets you were given: IF YOU HAVE QUESTIONS ABOUT YOUR [place], [place] [phone].   [place] - Preparing for Surgery Before surgery, you can play an important role.  Because skin is not sterile, your skin needs to be as free of germs as possible.  You can reduce the number of germs on your skin by washing with antibiotic  soap before surgery.  CHG is an antiseptic cleaner which kills germs and bonds with the skin to continue killing germs even after washing.  Do not shave (including legs and underarms) for at least 48 hours prior to the first antibiotic shower.  You may shave your face/neck.  Please follow these instructions carefully:  1.  Shower with Soap the night before surgery and the  morning of surgery.  2.  If you choose to wash your hair, wash your hair first as usual with your normal  shampoo.  3.  After you shampoo, rinse your hair and body thoroughly to remove the shampoo.                             4.  Use  as you would any other liquid soap.  You can apply chg directly to the skin and wash.  Gently with a scrungie or clean washcloth.  5.  Apply the Soap to your body ONLY FROM THE NECK DOWN.   Do not use on face/ open                           Wound or open sores. Avoid contact with eyes, ears mouth and genitals (private parts).                       Wash face,  Genitals (private parts) with your normal soap.  6.  Wash thoroughly, paying special attention to the area where your  surgery  will be performed.  7.  Thoroughly rinse your body with warm water from the neck down.  8.  DO NOT shower/wash with your normal soap after using and rinsing off the Soap.            9.  Pat yourself dry with a clean towel.            10.  Wear clean pajamas.            11.  Place clean sheets on your bed the night of your first shower and do not  sleep with  pets.  ON THE DAY OF SURGERY : Do not apply any lotions/deodorants the morning of surgery.  Please wear clean clothes to the hospital/surgery center.    FAILURE TO FOLLOW THESE INSTRUCTIONS MAY RESULT IN THE CANCELLATION OF YOUR SURGERY  PATIENT SIGNATURE_________________________________  NURSE SIGNATURE__________________________________  ________________________________________________________________________

## 2023-01-17 ENCOUNTER — Encounter (HOSPITAL_COMMUNITY): Payer: Self-pay

## 2023-01-17 ENCOUNTER — Encounter (HOSPITAL_COMMUNITY)
Admission: RE | Admit: 2023-01-17 | Discharge: 2023-01-17 | Disposition: A | Discharge status: Home / Self Care | Payer: Medicaid Other | Source: Ambulatory Visit | Attending: Surgery | Admitting: Surgery

## 2023-01-17 ENCOUNTER — Ambulatory Visit (INDEPENDENT_AMBULATORY_CARE_PROVIDER_SITE_OTHER): Payer: Medicaid Other

## 2023-01-17 ENCOUNTER — Other Ambulatory Visit: Payer: Self-pay

## 2023-01-17 VITALS — BP 126/84 | HR 64 | Temp 98.5°F | Resp 14 | Ht 62.5 in | Wt 108.0 lb

## 2023-01-17 DIAGNOSIS — Z01812 Encounter for preprocedural laboratory examination: Secondary | ICD-10-CM | POA: Diagnosis not present

## 2023-01-17 DIAGNOSIS — E46 Unspecified protein-calorie malnutrition: Secondary | ICD-10-CM | POA: Diagnosis not present

## 2023-01-17 DIAGNOSIS — Z01818 Encounter for other preprocedural examination: Secondary | ICD-10-CM

## 2023-01-17 DIAGNOSIS — J309 Allergic rhinitis, unspecified: Secondary | ICD-10-CM

## 2023-01-17 LAB — BASIC METABOLIC PANEL
Anion gap: 7 (ref 5–15)
BUN: 10 mg/dL (ref 6–20)
CO2: 27 mmol/L (ref 22–32)
Calcium: 9.4 mg/dL (ref 8.9–10.3)
Chloride: 104 mmol/L (ref 98–111)
Creatinine, Ser: 0.67 mg/dL (ref 0.44–1.00)
GFR, Estimated: >60 mL/min (ref >60)
Glucose, Bld: 74 mg/dL (ref 70–99)
Potassium: 3.4 mmol/L — ABNORMAL LOW (ref 3.5–5.1)
Sodium: 138 mmol/L (ref 135–145)

## 2023-01-17 LAB — CBC
HCT: 39.4 % (ref 36.0–46.0)
Hemoglobin: 12.8 g/dL (ref 12.0–15.0)
MCH: 30.5 pg (ref 26.0–34.0)
MCHC: 32.5 g/dL (ref 30.0–36.0)
MCV: 93.8 fL (ref 80.0–100.0)
Platelets: 319 10*3/uL (ref 150–400)
RBC: 4.2 MIL/uL (ref 3.87–5.11)
RDW: 12.7 % (ref 11.5–15.5)
WBC: 6.6 10*3/uL (ref 4.0–10.5)
nRBC: 0 % (ref 0.0–0.2)

## 2023-01-18 ENCOUNTER — Ambulatory Visit (INDEPENDENT_AMBULATORY_CARE_PROVIDER_SITE_OTHER): Payer: Medicaid Other | Admitting: Clinical

## 2023-01-18 DIAGNOSIS — F4310 Post-traumatic stress disorder, unspecified: Secondary | ICD-10-CM

## 2023-01-18 DIAGNOSIS — F909 Attention-deficit hyperactivity disorder, unspecified type: Secondary | ICD-10-CM | POA: Diagnosis not present

## 2023-01-18 DIAGNOSIS — Z658 Other specified problems related to psychosocial circumstances: Secondary | ICD-10-CM

## 2023-01-18 NOTE — Patient Instructions (Addendum)
Center for [facility] Healthcare at [facility] [place], [place] [address] [phone] (main office) [phone] ([place] office)  [employer] [place] [email or web address] [phone]  [facility] [email or web address]  Social Security Disability [email or web address]

## 2023-01-21 ENCOUNTER — Other Ambulatory Visit: Payer: Self-pay | Admitting: Family Medicine

## 2023-01-21 DIAGNOSIS — I700 Atherosclerosis of aorta: Secondary | ICD-10-CM

## 2023-01-22 ENCOUNTER — Ambulatory Visit (HOSPITAL_BASED_OUTPATIENT_CLINIC_OR_DEPARTMENT_OTHER): Payer: Medicaid Other | Admitting: Certified Registered"

## 2023-01-22 ENCOUNTER — Ambulatory Visit (HOSPITAL_COMMUNITY): Payer: Medicaid Other | Admitting: Physician Assistant

## 2023-01-22 ENCOUNTER — Encounter (HOSPITAL_COMMUNITY): Payer: Self-pay | Admitting: Surgery

## 2023-01-22 ENCOUNTER — Ambulatory Visit (HOSPITAL_COMMUNITY)
Admission: RE | Admit: 2023-01-22 | Discharge: 2023-01-23 | Disposition: A | Discharge status: Home / Self Care | Payer: Medicaid Other | Attending: Surgery | Admitting: Surgery

## 2023-01-22 ENCOUNTER — Other Ambulatory Visit: Payer: Self-pay

## 2023-01-22 ENCOUNTER — Encounter (HOSPITAL_COMMUNITY)
Admission: RE | Disposition: A | Discharge status: Home / Self Care | Payer: Self-pay | Source: Home / Self Care | Attending: Surgery

## 2023-01-22 DIAGNOSIS — K3184 Gastroparesis: Secondary | ICD-10-CM | POA: Diagnosis not present

## 2023-01-22 DIAGNOSIS — K3189 Other diseases of stomach and duodenum: Secondary | ICD-10-CM | POA: Diagnosis present

## 2023-01-22 DIAGNOSIS — K2100 Gastro-esophageal reflux disease with esophagitis, without bleeding: Secondary | ICD-10-CM | POA: Insufficient documentation

## 2023-01-22 DIAGNOSIS — F319 Bipolar disorder, unspecified: Secondary | ICD-10-CM | POA: Diagnosis not present

## 2023-01-22 DIAGNOSIS — K219 Gastro-esophageal reflux disease without esophagitis: Secondary | ICD-10-CM | POA: Insufficient documentation

## 2023-01-22 DIAGNOSIS — K449 Diaphragmatic hernia without obstruction or gangrene: Secondary | ICD-10-CM | POA: Diagnosis not present

## 2023-01-22 DIAGNOSIS — J45909 Unspecified asthma, uncomplicated: Secondary | ICD-10-CM | POA: Diagnosis not present

## 2023-01-22 DIAGNOSIS — J4520 Mild intermittent asthma, uncomplicated: Secondary | ICD-10-CM | POA: Diagnosis not present

## 2023-01-22 HISTORY — PX: LAPAROSCOPIC GASTRIC RESECTION: SHX1936

## 2023-01-22 LAB — CREATININE, SERUM
Creatinine, Ser: 0.67 mg/dL (ref 0.44–1.00)
GFR, Estimated: >60 mL/min (ref >60)

## 2023-01-22 LAB — CBC
HCT: 34.5 % — ABNORMAL LOW (ref 36.0–46.0)
Hemoglobin: 11.7 g/dL — ABNORMAL LOW (ref 12.0–15.0)
MCH: 31 pg (ref 26.0–34.0)
MCHC: 33.9 g/dL (ref 30.0–36.0)
MCV: 91.5 fL (ref 80.0–100.0)
Platelets: 264 10*3/uL (ref 150–400)
RBC: 3.77 MIL/uL — ABNORMAL LOW (ref 3.87–5.11)
RDW: 12.9 % (ref 11.5–15.5)
WBC: 9.3 10*3/uL (ref 4.0–10.5)
nRBC: 0 % (ref 0.0–0.2)

## 2023-01-22 SURGERY — LAPAROSCOPIC GASTRIC RESECTION
Anesthesia: General

## 2023-01-22 MED ORDER — OXYCODONE HCL 5 MG/5ML PO SOLN: 5.0000 mg | Freq: Once | ORAL | Status: DC | PRN

## 2023-01-22 MED ORDER — PROMETHAZINE HCL 25 MG PO TABS: 12.5000 mg | ORAL_TABLET | Freq: Four times a day (QID) | ORAL | Status: DC | PRN

## 2023-01-22 MED ORDER — ROCURONIUM BROMIDE 10 MG/ML (PF) SYRINGE
PREFILLED_SYRINGE | INTRAVENOUS | Status: DC | PRN
  Administered 2023-01-22: 40 mg via INTRAVENOUS

## 2023-01-22 MED ORDER — ALBUTEROL SULFATE HFA 108 (90 BASE) MCG/ACT IN AERS: 1.0000 | INHALATION_SPRAY | Freq: Four times a day (QID) | RESPIRATORY_TRACT | Status: DC | PRN

## 2023-01-22 MED ORDER — IBUPROFEN 400 MG PO TABS: 400.0000 mg | ORAL_TABLET | Freq: Three times a day (TID) | ORAL | Status: DC | PRN

## 2023-01-22 MED ORDER — DIPHENHYDRAMINE HCL 25 MG PO CAPS: 25.0000 mg | ORAL_CAPSULE | Freq: Four times a day (QID) | ORAL | Status: DC | PRN

## 2023-01-22 MED ORDER — OXYCODONE HCL 5 MG PO TABS: 5.0000 mg | ORAL_TABLET | ORAL | Status: DC | PRN

## 2023-01-22 MED ORDER — DOCUSATE SODIUM 100 MG PO CAPS
100.0000 mg | ORAL_CAPSULE | Freq: Two times a day (BID) | ORAL | Status: DC
  Administered 2023-01-22: 100 mg via ORAL
  Filled 2023-01-22: qty 1

## 2023-01-22 MED ORDER — KETOROLAC TROMETHAMINE 0.5 % OP SOLN
1.0000 [drp] | Freq: Four times a day (QID) | OPHTHALMIC | Status: DC
  Administered 2023-01-22: 1 [drp] via OPHTHALMIC
  Filled 2023-01-22: qty 5

## 2023-01-22 MED ORDER — ONDANSETRON HCL 4 MG/2ML IJ SOLN: 4.0000 mg | Freq: Four times a day (QID) | INTRAMUSCULAR | Status: DC | PRN

## 2023-01-22 MED ORDER — EPINEPHRINE 0.3 MG/0.3ML IJ SOAJ
0.3000 mg | INTRAMUSCULAR | Status: DC | PRN
  Filled 2023-01-22: qty 0.3

## 2023-01-22 MED ORDER — ACETAMINOPHEN 325 MG PO TABS
650.0000 mg | ORAL_TABLET | Freq: Four times a day (QID) | ORAL | Status: DC
  Administered 2023-01-22 – 2023-01-23 (×2): 650 mg via ORAL
  Filled 2023-01-22 (×2): qty 2

## 2023-01-22 MED ORDER — BUPIVACAINE-EPINEPHRINE (PF) 0.25% -1:200000 IJ SOLN
INTRAMUSCULAR | Status: AC
  Filled 2023-01-22: qty 30

## 2023-01-22 MED ORDER — SULFACETAMIDE SODIUM 10 % OP SOLN
1.0000 [drp] | OPHTHALMIC | Status: DC | PRN
  Filled 2023-01-22: qty 15

## 2023-01-22 MED ORDER — POLYETHYLENE GLYCOL 3350 17 GM/SCOOP PO POWD: 17.0000 g | Freq: Every day | ORAL | Status: DC

## 2023-01-22 MED ORDER — DEXAMETHASONE SODIUM PHOSPHATE 10 MG/ML IJ SOLN
INTRAMUSCULAR | Status: AC
  Filled 2023-01-22: qty 1

## 2023-01-22 MED ORDER — KETAMINE HCL 10 MG/ML IJ SOLN
INTRAMUSCULAR | Status: DC | PRN
  Administered 2023-01-22: 20 mg via INTRAVENOUS

## 2023-01-22 MED ORDER — GABAPENTIN 300 MG PO CAPS
300.0000 mg | ORAL_CAPSULE | Freq: Three times a day (TID) | ORAL | Status: DC
  Administered 2023-01-22: 300 mg via ORAL
  Filled 2023-01-22: qty 1

## 2023-01-22 MED ORDER — ONDANSETRON HCL 4 MG/2ML IJ SOLN
INTRAMUSCULAR | Status: AC
  Filled 2023-01-22: qty 2

## 2023-01-22 MED ORDER — FENTANYL CITRATE (PF) 100 MCG/2ML IJ SOLN
INTRAMUSCULAR | Status: DC | PRN
  Administered 2023-01-22: 100 ug via INTRAVENOUS

## 2023-01-22 MED ORDER — CROMOLYN SODIUM 4 % OP SOLN: 1.0000 [drp] | Freq: Four times a day (QID) | OPHTHALMIC | Status: DC | PRN

## 2023-01-22 MED ORDER — ACETAMINOPHEN 500 MG PO TABS
ORAL_TABLET | ORAL | Status: AC
  Filled 2023-01-22: qty 2

## 2023-01-22 MED ORDER — CHLORHEXIDINE GLUCONATE 0.12 % MT SOLN: 15.0000 mL | Freq: Once | OROMUCOSAL | Status: DC

## 2023-01-22 MED ORDER — SUGAMMADEX SODIUM 200 MG/2ML IV SOLN
INTRAVENOUS | Status: DC | PRN
  Administered 2023-01-22: 200 mg via INTRAVENOUS

## 2023-01-22 MED ORDER — LINACLOTIDE 145 MCG PO CAPS
290.0000 ug | ORAL_CAPSULE | Freq: Every evening | ORAL | Status: DC
  Administered 2023-01-22: 290 ug via ORAL
  Filled 2023-01-22: qty 2

## 2023-01-22 MED ORDER — MELATONIN 5 MG PO TABS
10.0000 mg | ORAL_TABLET | Freq: Every day | ORAL | Status: DC
  Administered 2023-01-22: 10 mg via ORAL
  Filled 2023-01-22: qty 2

## 2023-01-22 MED ORDER — ORAL CARE MOUTH RINSE: 15.0000 mL | Freq: Once | OROMUCOSAL | Status: DC

## 2023-01-22 MED ORDER — CIPROFLOXACIN IN D5W 400 MG/200ML IV SOLN
400.0000 mg | INTRAVENOUS | Status: AC
  Administered 2023-01-22: 400 mg via INTRAVENOUS
  Filled 2023-01-22: qty 200

## 2023-01-22 MED ORDER — ALBUTEROL SULFATE (2.5 MG/3ML) 0.083% IN NEBU: 2.5000 mg | INHALATION_SOLUTION | Freq: Four times a day (QID) | RESPIRATORY_TRACT | Status: DC | PRN

## 2023-01-22 MED ORDER — CHLORHEXIDINE GLUCONATE CLOTH 2 % EX PADS: 6.0000 | MEDICATED_PAD | Freq: Once | CUTANEOUS | Status: DC

## 2023-01-22 MED ORDER — OXYCODONE HCL 5 MG PO TABS: 5.0000 mg | ORAL_TABLET | Freq: Once | ORAL | Status: DC | PRN

## 2023-01-22 MED ORDER — 0.9 % SODIUM CHLORIDE (POUR BTL) OPTIME
TOPICAL | Status: DC | PRN
  Administered 2023-01-22: 1000 mL

## 2023-01-22 MED ORDER — PROCHLORPERAZINE EDISYLATE 10 MG/2ML IJ SOLN: 10.0000 mg | INTRAMUSCULAR | Status: DC | PRN

## 2023-01-22 MED ORDER — BUPIVACAINE LIPOSOME 1.3 % IJ SUSP
INTRAMUSCULAR | Status: DC | PRN
  Administered 2023-01-22: 20 mL

## 2023-01-22 MED ORDER — ACETAMINOPHEN 500 MG PO TABS: 500.0000 mg | ORAL_TABLET | Freq: Four times a day (QID) | ORAL | Status: DC | PRN

## 2023-01-22 MED ORDER — ONDANSETRON HCL 4 MG/2ML IJ SOLN: 4.0000 mg | Freq: Once | INTRAMUSCULAR | Status: DC | PRN

## 2023-01-22 MED ORDER — BUPIVACAINE-EPINEPHRINE 0.25% -1:200000 IJ SOLN
INTRAMUSCULAR | Status: DC | PRN
  Administered 2023-01-22: 30 mL

## 2023-01-22 MED ORDER — ROCURONIUM BROMIDE 10 MG/ML (PF) SYRINGE
PREFILLED_SYRINGE | INTRAVENOUS | Status: AC
  Filled 2023-01-22: qty 10

## 2023-01-22 MED ORDER — SCOPOLAMINE 1 MG/3DAYS TD PT72
1.0000 | MEDICATED_PATCH | TRANSDERMAL | Status: DC
  Administered 2023-01-22: 1.5 mg via TRANSDERMAL
  Filled 2023-01-22: qty 1

## 2023-01-22 MED ORDER — DEXAMETHASONE SODIUM PHOSPHATE 10 MG/ML IJ SOLN
INTRAMUSCULAR | Status: DC | PRN
  Administered 2023-01-22: 10 mg via INTRAVENOUS

## 2023-01-22 MED ORDER — KETAMINE HCL 50 MG/5ML IJ SOSY
PREFILLED_SYRINGE | INTRAMUSCULAR | Status: AC
  Filled 2023-01-22: qty 5

## 2023-01-22 MED ORDER — SIMETHICONE 80 MG PO CHEW: 80.0000 mg | CHEWABLE_TABLET | Freq: Four times a day (QID) | ORAL | Status: DC | PRN

## 2023-01-22 MED ORDER — FLUTICASONE PROPIONATE 50 MCG/ACT NA SUSP: 1.0000 | Freq: Two times a day (BID) | NASAL | Status: DC | PRN

## 2023-01-22 MED ORDER — MIDAZOLAM HCL 5 MG/5ML IJ SOLN
INTRAMUSCULAR | Status: DC | PRN
  Administered 2023-01-22: 2 mg via INTRAVENOUS

## 2023-01-22 MED ORDER — OXYCODONE-ACETAMINOPHEN 5-325 MG PO TABS: 1.0000 | ORAL_TABLET | ORAL | 0 refills | Status: DC | PRN

## 2023-01-22 MED ORDER — POLYETHYLENE GLYCOL 3350 17 G PO PACK: 17.0000 g | PACK | Freq: Every day | ORAL | Status: DC

## 2023-01-22 MED ORDER — MELATONIN 10 MG PO TABS: 10.0000 mg | ORAL_TABLET | Freq: Every day | ORAL | Status: DC

## 2023-01-22 MED ORDER — LIDOCAINE 2% (20 MG/ML) 5 ML SYRINGE
INTRAMUSCULAR | Status: DC | PRN
  Administered 2023-01-22: 100 mg via INTRAVENOUS

## 2023-01-22 MED ORDER — DROPERIDOL 2.5 MG/ML IJ SOLN
INTRAMUSCULAR | Status: DC | PRN
  Administered 2023-01-22: 1.25 mg via INTRAVENOUS

## 2023-01-22 MED ORDER — PROPOFOL 10 MG/ML IV BOLUS
INTRAVENOUS | Status: DC | PRN
  Administered 2023-01-22: 200 mg via INTRAVENOUS

## 2023-01-22 MED ORDER — LACTATED RINGERS IV SOLN: INTRAVENOUS | Status: DC

## 2023-01-22 MED ORDER — METRONIDAZOLE 500 MG/100ML IV SOLN
500.0000 mg | INTRAVENOUS | Status: AC
  Administered 2023-01-22: 500 mg via INTRAVENOUS
  Filled 2023-01-22: qty 100

## 2023-01-22 MED ORDER — LORATADINE 10 MG PO TABS
10.0000 mg | ORAL_TABLET | Freq: Every day | ORAL | Status: DC
  Administered 2023-01-22: 10 mg via ORAL
  Filled 2023-01-22: qty 1

## 2023-01-22 MED ORDER — METHOCARBAMOL 750 MG PO TABS: 750.0000 mg | ORAL_TABLET | Freq: Four times a day (QID) | ORAL | 0 refills | Status: DC

## 2023-01-22 MED ORDER — KETOROLAC TROMETHAMINE 15 MG/ML IJ SOLN
15.0000 mg | Freq: Three times a day (TID) | INTRAMUSCULAR | Status: DC
  Administered 2023-01-22 – 2023-01-23 (×2): 15 mg via INTRAVENOUS
  Filled 2023-01-22 (×2): qty 1

## 2023-01-22 MED ORDER — ATORVASTATIN CALCIUM 20 MG PO TABS
20.0000 mg | ORAL_TABLET | Freq: Every day | ORAL | Status: DC
  Administered 2023-01-22: 20 mg via ORAL
  Filled 2023-01-22: qty 1

## 2023-01-22 MED ORDER — FENTANYL CITRATE PF 50 MCG/ML IJ SOSY
PREFILLED_SYRINGE | INTRAMUSCULAR | Status: AC
  Filled 2023-01-22: qty 1

## 2023-01-22 MED ORDER — ACETAMINOPHEN 500 MG PO TABS
1000.0000 mg | ORAL_TABLET | ORAL | Status: AC
  Administered 2023-01-22: 1000 mg via ORAL

## 2023-01-22 MED ORDER — FENTANYL CITRATE (PF) 100 MCG/2ML IJ SOLN
INTRAMUSCULAR | Status: AC
  Filled 2023-01-22: qty 2

## 2023-01-22 MED ORDER — MIDAZOLAM HCL 2 MG/2ML IJ SOLN
INTRAMUSCULAR | Status: AC
  Filled 2023-01-22: qty 2

## 2023-01-22 MED ORDER — METHOCARBAMOL 1000 MG/10ML IJ SOLN
500.0000 mg | Freq: Four times a day (QID) | INTRAVENOUS | Status: DC | PRN
  Administered 2023-01-22: 500 mg via INTRAVENOUS
  Filled 2023-01-22: qty 500

## 2023-01-22 MED ORDER — HYDROMORPHONE HCL 1 MG/ML IJ SOLN
1.0000 mg | INTRAMUSCULAR | Status: DC | PRN
  Administered 2023-01-22: 1 mg via INTRAVENOUS
  Filled 2023-01-22: qty 1

## 2023-01-22 MED ORDER — LACTATED RINGERS IR SOLN
Status: DC | PRN
  Administered 2023-01-22: 1

## 2023-01-22 MED ORDER — BUPIVACAINE LIPOSOME 1.3 % IJ SUSP
INTRAMUSCULAR | Status: AC
  Filled 2023-01-22: qty 20

## 2023-01-22 MED ORDER — OXYCODONE HCL 5 MG PO TABS
10.0000 mg | ORAL_TABLET | ORAL | Status: DC | PRN
  Administered 2023-01-23 (×2): 10 mg via ORAL
  Filled 2023-01-22 (×2): qty 2

## 2023-01-22 MED ORDER — SUCCINYLCHOLINE CHLORIDE 200 MG/10ML IV SOSY
PREFILLED_SYRINGE | INTRAVENOUS | Status: DC | PRN
  Administered 2023-01-22: 100 mg via INTRAVENOUS

## 2023-01-22 MED ORDER — ENOXAPARIN SODIUM 40 MG/0.4ML IJ SOSY
40.0000 mg | PREFILLED_SYRINGE | INTRAMUSCULAR | Status: DC
  Filled 2023-01-22: qty 0.4

## 2023-01-22 MED ORDER — ESTROGENS CONJUGATED 0.625 MG PO TABS
0.6250 mg | ORAL_TABLET | Freq: Every evening | ORAL | Status: DC
  Administered 2023-01-22: 0.625 mg via ORAL
  Filled 2023-01-22: qty 1

## 2023-01-22 MED ORDER — ONDANSETRON HCL 4 MG/2ML IJ SOLN
INTRAMUSCULAR | Status: DC | PRN
  Administered 2023-01-22: 4 mg via INTRAVENOUS

## 2023-01-22 MED ORDER — KETOROLAC TROMETHAMINE 15 MG/ML IJ SOLN
15.0000 mg | INTRAMUSCULAR | Status: AC
  Administered 2023-01-22: 15 mg via INTRAVENOUS

## 2023-01-22 MED ORDER — FENTANYL CITRATE PF 50 MCG/ML IJ SOSY
25.0000 ug | PREFILLED_SYRINGE | INTRAMUSCULAR | Status: DC | PRN
  Administered 2023-01-22 (×2): 50 ug via INTRAVENOUS

## 2023-01-22 SURGICAL SUPPLY — 57 items
ADH SKN CLS APL DERMABOND .7 (GAUZE/BANDAGES/DRESSINGS) ×1
ANTIFOG SOL W/FOAM PAD STRL (MISCELLANEOUS) ×1
APL PRP STRL LF DISP 70% ISPRP (MISCELLANEOUS) ×2
BAG COUNTER SPONGE SURGICOUNT (BAG) IMPLANT
BAG SPNG CNTER NS LX DISP (BAG)
BLADE SURG SZ11 CARB STEEL (BLADE) ×1 IMPLANT
CHLORAPREP W/TINT 26 (MISCELLANEOUS) ×2 IMPLANT
COVER SURGICAL LIGHT HANDLE (MISCELLANEOUS) ×1 IMPLANT
DERMABOND ADVANCED .7 DNX12 (GAUZE/BANDAGES/DRESSINGS) ×1 IMPLANT
DRAPE UTILITY XL STRL (DRAPES) ×2 IMPLANT
ELECT REM PT RETURN 15FT ADLT (MISCELLANEOUS) ×1 IMPLANT
GLOVE BIO SURGEON STRL SZ7.5 (GLOVE) ×1 IMPLANT
GLOVE BIOGEL PI IND STRL 8 (GLOVE) ×1 IMPLANT
GOWN STRL REUS W/ TWL XL LVL3 (GOWN DISPOSABLE) ×2 IMPLANT
GOWN STRL REUS W/TWL XL LVL3 (GOWN DISPOSABLE) ×2
GRASPER SUT TROCAR 14GX15 (MISCELLANEOUS) ×1 IMPLANT
IRRIG SUCT STRYKERFLOW 2 WTIP (MISCELLANEOUS) ×1
IRRIGATION SUCT STRKRFLW 2 WTP (MISCELLANEOUS) ×1 IMPLANT
KIT BASIN OR (CUSTOM PROCEDURE TRAY) ×1 IMPLANT
KIT TURNOVER KIT A (KITS) IMPLANT
MAT PREVALON FULL STRYKER (MISCELLANEOUS) ×1 IMPLANT
NDL INSUFFLATION 14GA 120MM (NEEDLE) IMPLANT
NDL SPNL 18GX3.5 QUINCKE PK (NEEDLE) ×1 IMPLANT
NEEDLE INSUFFLATION 14GA 120MM (NEEDLE) ×1 IMPLANT
NEEDLE SPNL 18GX3.5 QUINCKE PK (NEEDLE) ×1 IMPLANT
PACK UNIVERSAL I (CUSTOM PROCEDURE TRAY) ×1 IMPLANT
RELOAD STAPLE 60 3.6 BLU REG (STAPLE) IMPLANT
RELOAD STAPLE 60 3.8 GOLD REG (STAPLE) IMPLANT
RELOAD STAPLE 60 4.1 GRN THCK (STAPLE) IMPLANT
RELOAD STAPLER BLUE 60MM (STAPLE) IMPLANT
RELOAD STAPLER GOLD 60MM (STAPLE) IMPLANT
RELOAD STAPLER GREEN 60MM (STAPLE) IMPLANT
SCISSORS LAP 5X45 EPIX DISP (ENDOMECHANICALS) ×1 IMPLANT
SEALER TISSUE G2 CVD JAW 45CM (ENDOMECHANICALS) IMPLANT
SET TUBE SMOKE EVAC HIGH FLOW (TUBING) ×1 IMPLANT
SLEEVE ADV FIXATION 5X100MM (TROCAR) ×2 IMPLANT
SLEEVE GASTRECTOMY 40FR VISIGI (MISCELLANEOUS) ×1 IMPLANT
SOLUTION ANTFG W/FOAM PAD STRL (MISCELLANEOUS) ×1 IMPLANT
SPIKE FLUID TRANSFER (MISCELLANEOUS) ×1 IMPLANT
STAPLER ECHELON BIOABSB 60 FLE (MISCELLANEOUS) IMPLANT
STAPLER ECHELON LONG 60 440 (INSTRUMENTS) ×1 IMPLANT
STAPLER RELOAD BLUE 60MM (STAPLE)
STAPLER RELOAD GOLD 60MM (STAPLE)
STAPLER RELOAD GREEN 60MM (STAPLE)
SUT MNCRL AB 4-0 PS2 18 (SUTURE) ×1 IMPLANT
SUT SILK 0 SH 30 (SUTURE) IMPLANT
SUT VICRYL 0 TIES 12 18 (SUTURE) ×1 IMPLANT
SYR 20ML LL LF (SYRINGE) ×1 IMPLANT
SYR 50ML LL SCALE MARK (SYRINGE) ×1 IMPLANT
SYS KII OPTICAL ACCESS 15MM (TROCAR) ×1
SYSTEM KII OPTICAL ACCESS 15MM (TROCAR) ×1 IMPLANT
TOWEL OR 17X26 10 PK STRL BLUE (TOWEL DISPOSABLE) ×1 IMPLANT
TOWEL OR NON WOVEN STRL DISP B (DISPOSABLE) ×1 IMPLANT
TROCAR ADV FIXATION 5X100MM (TROCAR) ×1 IMPLANT
TROCAR Z-THREAD OPTICAL 5X100M (TROCAR) ×1 IMPLANT
TUBING CONNECTING 10 (TUBING) ×1 IMPLANT
TUBING ENDO SMARTCAP (MISCELLANEOUS) ×1 IMPLANT

## 2023-01-22 NOTE — Anesthesia Preprocedure Evaluation (Addendum)
Anesthesia Evaluation  Patient identified by MRN, date of birth, ID band Patient awake    Reviewed: Allergy & Precautions, H&P , NPO status , Patient's Chart, lab work & pertinent test results  Airway Mallampati: II  TM Distance: >3 FB Neck ROM: Full    Dental no notable dental hx.    Pulmonary asthma    Pulmonary exam normal breath sounds clear to auscultation       Cardiovascular negative cardio ROS Normal cardiovascular exam Rhythm:Regular Rate:Normal     Neuro/Psych     Bipolar Disorder   negative neurological ROS     GI/Hepatic Neg liver ROS, hiatal hernia,GERD  ,,  Endo/Other  negative endocrine ROS    Renal/GU negative Renal ROS  negative genitourinary   Musculoskeletal negative musculoskeletal ROS (+)    Abdominal   Peds negative pediatric ROS (+)  Hematology negative hematology ROS (+)   Anesthesia Other Findings   Reproductive/Obstetrics negative OB ROS                             Anesthesia Physical Anesthesia Plan  ASA: 2  Anesthesia Plan: General   Post-op Pain Management: Toradol IV (intra-op)*, Tylenol PO (pre-op)* and Ketamine IV*   Induction: Intravenous and Rapid sequence  PONV Risk Score and Plan: 3 and Ondansetron, Dexamethasone, Droperidol, Midazolam and Treatment may vary due to age or medical condition  Airway Management Planned: Oral ETT  Additional Equipment:   Intra-op Plan:   Post-operative Plan: Extubation in OR  Informed Consent: I have reviewed the patients History and Physical, chart, labs and discussed the procedure including the risks, benefits and alternatives for the proposed anesthesia with the patient or authorized representative who has indicated his/her understanding and acceptance.     Dental advisory given  Plan Discussed with: CRNA and Surgeon  Anesthesia Plan Comments:        Anesthesia Quick Evaluation

## 2023-01-22 NOTE — Anesthesia Procedure Notes (Signed)
Procedure Name: Intubation Date/Time: 01/22/2023 5:16 PM  Performed by: Cleda Daub, CRNAPre-anesthesia Checklist: Patient identified, Emergency Drugs available, Suction available and Patient being monitored Patient Re-evaluated:Patient Re-evaluated prior to induction Oxygen Delivery Method: Circle system utilized Preoxygenation: Pre-oxygenation with 100% oxygen Induction Type: IV induction and Rapid sequence Laryngoscope Size: Mac and 3 Grade View: Grade I Tube type: Oral Tube size: 7.0 mm Number of attempts: 1 Airway Equipment and Method: Stylet and Oral airway Placement Confirmation: ETT inserted through vocal cords under direct vision, positive ETCO2 and breath sounds checked- equal and bilateral Secured at: 21 cm Tube secured with: Tape Dental Injury: Teeth and Oropharynx as per pre-operative assessment

## 2023-01-22 NOTE — Op Note (Signed)
Patient: Rachel [name] (03/06/1986, [id number])  Date of Surgery: 01/22/2023   Preoperative Diagnosis: Gastric volvulus   Postoperative Diagnosis: Gastric volvulus   Surgical Procedure: LAPAROSCOPIC GASTRIC PEXY:    Operative Team Members:  Surgeon(s) and Role:    * Indigo Chaddock, Nickola Major, MD - Primary  Blanchie Serve, [place]  Anesthesiologist: Myrtie Soman, MD CRNA: Cleda Daub, CRNA   Anesthesia: General   Fluids:  No intake/output data recorded.  Complications: * No complications entered in OR log *  Drains:  none   Specimen: * No specimens in log *   Disposition:  PACU - hemodynamically stable.  Plan of Care: Admit for overnight observation    Indications for Procedure:  Ms. Matyas presented twice to my office after I had the opportunity to discuss her case after had the opportunity to talk with Dr. Silverio Decamp, my partners, and my surgical mentors. 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 Patients often struggle to secure diagnosis, and many may not end up getting surgery to fix this problem. 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 I do think the patient would benefit from a laparoscopic gastric pexy. I explained I would use transfascial permanent sutures to connect the greater curve of the stomach to the abdominal wall to fix its orientation. I explained the procedure itself as well as its risk, benefits, and alternatives. I explained the risk of recurrent issues. I explained she may be having pain from other problems and the surgery may not fix her pain. I explained the surgery would be to fix a structural issue with the stomach, and would not address the motility issues that she deals with. After full discussion all questions answered the patient granted consent to proceed. We will proceed as  scheduled.   Findings: Massive dilation of colon.  No evidence of gastric volvulus today   Description of Procedure:   On the date stated above the patient taken operating suite placed supine position.  General endotracheal anesthesia was induced.  A timeout was completed verifying correct patient, procedure, position, and equipment for the case.  Patient abdomen was prepped draped in usual sterile fashion.  I made three 5 mm incisions about the level of the umbilicus across the abdomen.  I entered the abdomen with a Veress needle insufflated the abdomen to 15 mmHg.  Three 5 mm trocars were placed in this position.  There is no trauma to the underlying viscera with initial trocar placement.  The colon was massively dilated.  The stomach was identified and pexied to the abdominal wall.  I used for 0 silk sutures and a PMI suture passer with small skin next to take bites through the abdominal musculature, the greater curve of the stomach, and back out through the abdominal musculature and then these were tied down extracorporeally.  They were cut in the subcutaneous layer and the skin over these was glued closed with Dermabond.  The abdomen was desufflated and the 5 mm trocars removed.  The skin at the 5 mm trocar sites was closed using 4-0 Monocryl and Dermabond.  All sponge needle counts were correct at the end of this case.  At the end of the case we reviewed the infection status of the case. Patient: Private Patient Elective Case Case: Elective Infection Present At Time Of Surgery (PATOS): None  Louanna Raw, MD General, Bariatric, &  Minimally Invasive Surgery [facility] Surgery, [place]

## 2023-01-22 NOTE — H&P (Signed)
Admitting Physician: Nickola Major Dietrick Barris  Service: General Surgery  CC: Gastric volvulus  Subjective   HPI: Rachel [name] is an 37 y.o. female who is here for gastric volvulus  Past Medical History:  Diagnosis Date   ADHD (attention deficit hyperactivity disorder)    Anemia    Anxiety    Asthma    Bipolar disorder (HCC)    Complication of anesthesia    slow to wake up    Delayed gastric emptying 02/23/2020   Depression    DUB (dysfunctional uterine bleeding) 02/23/2020   Eczema    Ehlers-Danlos syndrome    Endometriosis    Gastroparesis    GERD (gastroesophageal reflux disease)    Heart murmur    Hiatal hernia 04/06/2020   Myalgia 04/06/2020   Nausea & vomiting 09/22/2022   Parasitic infection 02/23/2020   Pneumonia    Poor appetite 02/23/2020   Recurrent upper respiratory infection (URI)    RUQ pain 02/23/2020   Vitamin D deficiency    Weight loss 02/23/2020    Past Surgical History:  Procedure Laterality Date   barium study     COLONOSCOPY     CYSTOSCOPY N/A 07/12/2021   Procedure: CYSTOSCOPY;  Surgeon: [name], MD;  Location: [facility];  Service: Gynecology;  Laterality: N/A;   ESOPHAGOGASTRODUODENOSCOPY  2011   and a ph study as well.    LAPAROSCOPY N/A 01/26/2021   Procedure: LAPAROSCOPY DIAGNOSTIC;  Surgeon: [name], MD;  Location: [facility];  Service: Gynecology;  Laterality: N/A;   NASAL SEPTUM SURGERY     SINOSCOPY     TONGUE FLAP     TOTAL LAPAROSCOPIC HYSTERECTOMY WITH SALPINGECTOMY Bilateral 07/12/2021   Procedure: TOTAL LAPAROSCOPIC HYSTERECTOMY WITH SALPINGECTOMY, OOPHORECTOMY;  Surgeon: [name], MD;  Location: [facility];  Service: Gynecology;  Laterality: Bilateral;   UPPER GASTROINTESTINAL ENDOSCOPY     WISDOM TOOTH EXTRACTION      Family History  Problem Relation Age of Onset   Eczema Mother    Urticaria Mother    Allergic rhinitis Mother    Colon polyps Mother    Depression Mother     Anxiety disorder Mother    Hypertension Mother    Hyperlipidemia Mother    Asthma Mother    Endometriosis Mother    Fibroids Mother    Fibromyalgia Mother    Migraines Mother    Irritable bowel syndrome Mother    Allergic rhinitis Father    Depression Father    Hypertension Father    Hyperlipidemia Father    Bipolar disorder Father    Urticaria Sister    Allergic rhinitis Sister    Bipolar disorder Sister    ADD / ADHD Sister    Asthma Sister    Endometriosis Sister    Seizures Sister    Bipolar disorder Sister    Anxiety disorder Sister    Heart disease Maternal Grandmother    Endometriosis Maternal Grandmother    Fibroids Maternal Grandmother    Hyperlipidemia Maternal Grandmother    Hypertension Maternal Grandmother    Diabetes Maternal Grandmother    Fibromyalgia Maternal Grandmother    Heart disease Maternal Grandfather    Diabetes Maternal Grandfather    Heart disease Paternal Grandmother    Heart disease Paternal Grandfather    Colon cancer Maternal Great-grandmother    Pancreatic cancer Neg Hx    Esophageal cancer Neg Hx    Stomach cancer Neg Hx     Social:  reports that she has never smoked. She has never been exposed to tobacco smoke. She has never used smokeless tobacco. She reports current alcohol use of about 1.0 standard drink of alcohol per week. She reports that she does not use drugs.  Allergies:  Allergies  Allergen Reactions   Amitriptyline Anaphylaxis   Amphetamine-Dextroamphetamine Other (See Comments)    Triggered bipolar, caused depression, and suicidal thoughts   Lupron [Leuprolide] Anaphylaxis   Orilissa [Elagolix] Anaphylaxis   Penicillins Anaphylaxis, Swelling and Other (See Comments)    Potential for anaphylaxis confirmed. Tolerates Cefphalosporins No "-cillins"!!!! Did it involve swelling of the face/tongue/throat, SOB, or low BP? Yes Did it involve sudden or severe rash/hives, skin peeling, or any reaction on the inside of your  mouth or nose? Unknown Did you need to seek medical attention at a hospital or doctor's office? Unknown When did it last happen? teenager      If all above answers are "NO", may proceed with cephalosporin use.   Wound Dressing Adhesive Rash   Betadine [Povidone-Iodine] Swelling and Rash   Chlorhexidine Hives    DuraPrep    Doxycycline Itching, Nausea And Vomiting and Other (See Comments)    Caused high fever, also   Mixed Ragweed Other (See Comments)    Unknown, Mother and pt state she can't have    Olanzapine Other (See Comments)    Caused mania   Other Other (See Comments)    Stimulants, mood stabilizers, and antidepressants = Triggered bipolar, caused depression, and suicidal thoughts   Prozac [Fluoxetine Hcl] Other (See Comments)    Triggered bipolar, caused depression, and suicidal thoughts   Stevia Glycerite Extract [Flavoring Agent] Other (See Comments)    Unknown reaction; mother and pt confirm any Artificial Sweeteners cause reaction   Tape Other (See Comments)    Paper tape is tolerated   Triple Antibiotic Pain Relief [Neomy-Bacit-Polymyx-Pramoxine] Hives   Reglan [Metoclopramide] Anxiety and Other (See Comments)    Triggered bipolar, caused depression, suicidal thoughts, muscle twitching, stiffness, and high anxiety     Medications: Current Outpatient Medications  Medication Instructions   acetaminophen (TYLENOL) 500-1,000 mg, Oral, Every 6 hours PRN   albuterol (PROVENTIL) 2.5 mg, Nebulization, Every 6 hours PRN, Max 4 doses per day   albuterol (VENTOLIN HFA) 108 (90 Base) MCG/ACT inhaler 1-2 puffs, Inhalation, Every 6 hours PRN   atorvastatin (LIPITOR) 20 mg, Oral, Daily   B Complex Vitamins (B COMPLEX PO) 1 capsule, Oral, Every evening   Calcium-Magnesium-Vitamin D 600-40-500 MG-MG-UNIT TB24 1 tablet, Oral, Every evening   cromolyn (OPTICROM) 4 % ophthalmic solution 1 drop, Both Eyes, 4 times daily PRN   diphenhydrAMINE (BENADRYL) 25 mg, Oral, Every 6 hours PRN    EPINEPHrine (EPI-PEN) 0.3 mg, Intramuscular, As needed   estrogens (conjugated) (PREMARIN) 0.625 mg, Oral, Daily, Take daily for 21 days then do not take for 7 days.   fexofenadine (ALLEGRA) 180 mg, Oral, Every evening   Fish Oil 1,200 mg, Oral, Daily   fluticasone (FLONASE) 50 MCG/ACT nasal spray 1 spray, Each Nare, 2 times daily PRN   ibuprofen (ADVIL) 400-800 mg, Oral, Every 8 hours PRN   Iron (Ferrous Sulfate) 325 mg, Oral, Daily   ketorolac (ACULAR) 0.5 % ophthalmic solution 1 drop, Right Eye, 4 times daily   LINZESS 290 MCG CAPS capsule TAKE 1 CAPSULE BY MOUTH DAILY BEFORE BREAKFAST   Melatonin 10 mg, Oral, Daily at bedtime   MiraLax 17-34 g, Oral, Daily, Mix 17 g of powder into water  and drink once a day   Olopatadine HCl (PATADAY) 0.2 % SOLN 1-2 drops, Both Eyes, 3 times daily PRN   promethazine (PHENERGAN) 12.5 mg, Oral, Every 6 hours PRN   sennosides-docusate sodium (SENOKOT-S) 8.6-50 MG tablet 2-3 tablets, Oral, Daily PRN   sulfacetamide (BLEPH-10) 10 % ophthalmic solution 1-2 drops, Right Eye, Every  3 hours, Use while awake   triamcinolone cream (KENALOG) 0.1 % APPLY TO AFFECTED AREA(S) TWO TIMES A DAY   VITAMIN A PO 3,000 mcg, Oral, Every evening   zinc sulfate 220 mg, Oral, Every evening    ROS - all of the below systems have been reviewed with the patient and positives are indicated with bold text General: chills, fever or night sweats Eyes: blurry vision or double vision ENT: epistaxis or sore throat Allergy/Immunology: itchy/watery eyes or nasal congestion Hematologic/Lymphatic: bleeding problems, blood clots or swollen lymph nodes Endocrine: temperature intolerance or unexpected weight changes Breast: new or changing breast lumps or nipple discharge Resp: cough, shortness of breath, or wheezing CV: chest pain or dyspnea on exertion GI: as per HPI GU: dysuria, trouble voiding, or hematuria MSK: joint pain or joint stiffness Neuro: TIA or stroke symptoms Derm:  pruritus and skin lesion changes Psych: anxiety and depression  Objective   PE Blood pressure 121/85, pulse 66, temperature 98.1 F (36.7 C), temperature source Oral, resp. rate 14, height 5' 2.5" (1.588 m), weight 49 kg, SpO2 100 %. Constitutional: NAD; conversant; no deformities Eyes: Moist conjunctiva; no lid lag; anicteric; PERRL Neck: Trachea midline; no thyromegaly Lungs: Normal respiratory effort; no tactile fremitus CV: RRR; no palpable thrills; no pitting edema GI: Abd Soft, nontender; no palpable hepatosplenomegaly MSK: Normal range of motion of extremities; no clubbing/cyanosis Psychiatric: Appropriate affect; alert and oriented x3 Lymphatic: No palpable cervical or axillary lymphadenopathy  No results found for this or any previous visit (from the past 24 hour(s)).  Imaging Orders  No imaging studies ordered today    UGI 09/22/22 1. No gastric volvulus or obstruction. 2. Atypical orientation of the stomach related to elevation of the LEFT hemidiaphragm. 3. Normal duodenum. 4. No hiatal hernia.  CT Abd/pel 09/22/22 Possible gastric volvulus. Upper GI may be beneficial for further evaluation if clinically warranted. No evidence of ischemia. Wall thickening about the stomach may be due to underdistention or gastritis. Otherwise no acute abnormality in the abdomen or pelvis.  RUQ [place] 09/21/22 Contracted gallbladder without visible gallstones.  CT C/A/P 07/26/22 1. Aortic atherosclerosis with no evidence of aortic aneurysm or dissection. 2. Nonspecific tree-in-bud nodular opacities in the left upper lobe, which may be infectious or inflammatory. 3. No acute process in the abdomen and pelvis.  CT Neck 05/31/22 Normal CT of the neck.   CT Renal Stone Study 03/23/22 1. No renal stones or obstructive uropathy. 2. Mild gaseous distention of colon, also seen on prior exam, currently no liquid colonic stool. No colonic inflammation.  CT Abd/pel 01/17/22 1. New  abnormal hypoenhancing regions in the left kidney upper pole. Given the clinical context, the appearance favors acute left renal pyelonephritis. 2. Air fluid levels in the distal colon compatible with diarrheal process. Mild gaseous prominence of the colon.  CT Abd/Pel 05/26/21 Fatty liver. Normal appendix. No acute abnormality is identified to correspond with the given clinical history.  UGI 04/28/20 1. Subjectively delayed gastric emptying. 2. 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 3. Otherwise normal upper GI and small-bowel follow-through.  CT Abd/Pel 04/08/20 1. No CT findings of the  abdomen or pelvis to explain pain or nausea.  2. No evidence of hiatal hernia at the time of this examination.  [place] RUQ 03/01/20 Negative exam.   US Pelvis 01/16/20 No pelvic mass or other significant abnormality identified. No sonographic evidence for ovarian torsion.  Gastric emptying study 08/26/20 CONCLUSION:  Normal 2-hour gastric retention.  Normal 4-hour gastric retention.   EGD with Dr. Silverio Decamp 04/01/20 Findings: - LA Grade A (one or more mucosal breaks less than 5 mm, not extending between tops of 2 mucosal folds) esophagitis with no bleeding was found 33 to 36 cm from the incisors. Findings: - Partial gastric volvulus was present, most of the stomach appears to have herniated up, large hiatal hernia with hiatal narrowing location in the gastric antrum. Otherwise gastric mucosa appeared normal. - The examined duodenum was normal. Impression: LA Grade A reflux esophagitis with no bleeding. - Large hiatal hernia - Normal examined duodenum. - No specimens collected.  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  Ms. Puglise presented twice to my office after I had the opportunity to discuss her case after had the opportunity to talk with Dr. Silverio Decamp, my partners, and my surgical mentors. In situ  gastric volvulus has been described, and can be difficult to diagnose due to its intermittent nature. Patients often struggle to secure diagnosis, and many may not end up getting surgery to fix this problem. 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 I do think the patient would benefit from a laparoscopic gastric pexy. I explained I would use transfascial permanent sutures to connect the greater curve of the stomach to the abdominal wall to fix its orientation. I explained the procedure itself as well as its risk, benefits, and alternatives. I explained the risk of recurrent issues. I explained she may be having pain from other problems and the surgery may not fix her pain. I explained the surgery would be to fix a structural issue with the stomach, and would not address the motility issues that she deals with. After full discussion all questions answered the patient granted consent to proceed. We will proceed as scheduled.  Felicie Morn, MD  [facility] Use [email or web address] to contact on call provider

## 2023-01-22 NOTE — Progress Notes (Signed)
Called patient to discuss her time change for surgery today.  Patient expressed her frustration with this surgery time change because this is the 4th time her surgery has been moved.  Patient stated that she is disabled and is dependent on others for transportation and help at home and this is just frustrating to change plans.  This RN expressed understanding to the patient and apologized for the multiple time changes.  Patient verbalized to arrive at 1330 for surgery.  Jasmine Pang BSN, Academic librarian - Perioperative Services [place] [phone]

## 2023-01-22 NOTE — Anesthesia Postprocedure Evaluation (Signed)
Anesthesia Post Note  Patient: Rachel [name]  Procedure(s) Performed: LAPAROSCOPIC GASTRIC PEXY     Patient location during evaluation: PACU Anesthesia Type: General Level of consciousness: awake and alert Pain management: pain level controlled Vital Signs Assessment: post-procedure vital signs reviewed and stable Respiratory status: spontaneous breathing, nonlabored ventilation, respiratory function stable and patient connected to nasal cannula oxygen Cardiovascular status: blood pressure returned to baseline and stable Postop Assessment: no apparent nausea or vomiting Anesthetic complications: no  No notable events documented.  Last Vitals:  Vitals:   01/22/23 1824 01/22/23 1830  BP: 135/82 128/80  Pulse: 69 79  Resp: 19 18  Temp: 36.6 C   SpO2: 100% 100%    Last Pain:  Vitals:   01/22/23 1830  TempSrc:   PainSc: 9                  Talaysha Freeberg S

## 2023-01-22 NOTE — Transfer of Care (Signed)
Immediate Anesthesia Transfer of Care Note  Patient: Rachel [name]  Procedure(s) Performed: LAPAROSCOPIC GASTRIC PEXY  Patient Location: PACU  Anesthesia Type:General  Level of Consciousness: awake, alert , oriented, and patient cooperative  Airway & Oxygen Therapy: Patient Spontanous Breathing and Patient connected to face mask oxygen  Post-op Assessment: Report given to RN and Post -op Vital signs reviewed and stable  Post vital signs: Reviewed and stable  Last Vitals:  Vitals Value Taken Time  BP 135/82 01/22/23 1824  Temp    Pulse 69 01/22/23 1825  Resp 17 01/22/23 1825  SpO2 100 % 01/22/23 1825  Vitals shown include unvalidated device data.  Last Pain:  Vitals:   01/22/23 1410  TempSrc:   PainSc: 6       Patients Stated Pain Goal: 5 ([id number])  Complications: No notable events documented.

## 2023-01-22 NOTE — Telephone Encounter (Signed)
Requested Prescriptions  Pending Prescriptions Disp Refills   atorvastatin (LIPITOR) 20 MG tablet [Pharmacy Med Name: ATORVASTATIN 20 MG TABLET] 90 tablet 1    Sig: TAKE 1 TABLET BY MOUTH DAILY     Cardiovascular:  Antilipid - Statins Failed - 01/21/2023  6:31 AM      Failed - Lipid Panel in normal range within the last 12 months    Cholesterol, Total  Date Value Ref Range Status  08/08/2022 228 (H) 100 - 199 mg/dL Final   LDL Chol Calc (NIH)  Date Value Ref Range Status  08/08/2022 132 (H) 0 - 99 mg/dL Final   HDL  Date Value Ref Range Status  08/08/2022 75 >39 mg/dL Final   Triglycerides  Date Value Ref Range Status  08/08/2022 120 0 - 149 mg/dL Final         Passed - Patient is not pregnant      Passed - Valid encounter within last 12 months    Recent Outpatient Visits           2 months ago Brain fog   [place], Charlane Ferretti, MD   5 months ago Aortic atherosclerosis [facility])   [place] Charlott Rakes, MD   8 months ago Recurrent pyelonephritis   [place], Charlane Ferretti, MD   11 months ago Irritable bowel syndrome with both constipation and diarrhea   [place], Enobong, MD   1 year ago Anaphylaxis, sequela   [place] Dorna Mai, MD       Future Appointments             In 1 month Maudie Mercury Albin Felling, DO [facility] [place]   In 3 months Charlott Rakes, [place]

## 2023-01-22 NOTE — Discharge Instructions (Signed)
 POST OPERATIVE INSTRUCTIONS  Thinking Clearly  The anesthesia may cause you to feel different for 1 or 2 days. Do not drive, drink alcohol, or make any big decisions for at least 2 days.  Nutrition When you wake up, you will be able to drink small amounts of liquid. If you do not feel sick, you can slowly advance your diet to regular foods. Continue to drink lots of fluids, usually about 8 to 10 glasses per day. Eat a high-fiber diet so you don't strain during bowel movements. High-Fiber Foods Foods high in fiber include beans, bran cereals and whole-grain breads, peas, dried fruit (figs, apricots, and dates), raspberries, blackberries, strawberries, sweet corn, broccoli, baked potatoes with skin, plums, pears, apples, greens, and nuts. Activity Slowly increase your activity. Be sure to get up and walk every hour or so to prevent blood clots. No heavy lifting or strenuous activity for 4 weeks following surgery to prevent hernias at your incision sites It is normal to feel tired. You may need more sleep than usual.  Get your rest but make sure to get up and move around frequently to prevent blood clots and pneumonia.  Work and Return to School You can go back to work when you feel well enough. Discuss the timing with your surgeon. You can usually go back to school or work 1 week after an operation. If your work requires heavy lifting or strenuous activity you need to be placed on light duty for 4 weeks following surgery. You can return to gym class, sports or other physical activities 4 weeks after surgery.  Wound Care Always wash your hands before and after touching near your incision site. Do not soak in a bathtub until cleared at your follow up appointment. You may take a shower 24 hours after surgery. A small amount of drainage from the incision is normal. If the drainage is thick and yellow or the site is red, you may have an infection, so call your surgeon. If you have a drain in  one of your incisions, it will be taken out in office when the drainage stops. Steri-Strips will fall off in 7 to 10 days or they will be removed during your first office visit. If you have dermabond glue covering over the incision, allow the glue to flake off on its own. Avoid wearing tight or rough clothing. It may rub your incisions and make it harder for them to heal. Protect the new skin, especially from the sun. The sun can burn and cause darker scarring. Your scar will heal in about 4 to 6 weeks and will become softer and continue to fade over the next year.  The cosmetic appearance of the incisions will improve over the course of the first year after surgery. Sensation around your incision will return in a few weeks or months.  Bowel Movements After intestinal surgery, you may have loose watery stools for several days. If watery diarrhea lasts longer than 3 days, contact your surgeon. Pain medication (narcotics) can cause constipation. Increase the fiber in your diet with high-fiber foods if you are constipated. You can take an over the counter stool softener like Colace to avoid constipation.  Additional over the counter medications can also be used if Colace isn't sufficient (for example, Milk of Magnesia or Miralax).  Pain The amount of pain is different for each person. Some people need only 1 to 3 doses of pain control medication, while others need more. Take alternating doses of tylenol and   ibuprofen around the clock for the first five days following surgery.  This will provide a baseline of pain control and help with inflammation.  Take the narcotic pain medication in addition if needed for severe pain.  Contact Your Surgeon at [phone], if you have: Pain in your right upper abdomen like a gallbladder attack. Pain that will not go away Pain that gets worse A fever of more than 101F (38.3C) Repeated vomiting Swelling, redness, bleeding, or bad-smelling drainage from your  wound site Strong abdominal pain No bowel movement or unable to pass gas for 3 days Watery diarrhea lasting longer than 3 days  Pain Control The goal of pain control is to minimize pain, keep you moving and help you heal. Your surgical team will work with you on your pain plan. Most often a combination of therapies and medications are used to control your pain. You may also be given medication (local anesthetic) at the surgical site. This may help control your pain for several days. Extreme pain puts extra stress on your body at a time when your body needs to focus on healing. Do not wait until your pain has reached a level "10" or is unbearable before telling your doctor or nurse. It is much easier to control pain before it becomes severe. Following a laparoscopic procedure, pain is sometimes felt in the shoulder. This is due to the gas inserted into your abdomen during the procedure. Moving and walking helps to decrease the gas and the right shoulder pain.  Use the guide below for ways to manage your post-operative pain. Learn more by going to facs.org/safepaincontrol.  How Intense Is My Pain Common Therapies to Feel Better       I hardly notice my pain, and it does not interfere with my activities.  I notice my pain and it distracts me, but I can still do activities (sitting up, walking, standing).  Non-Medication Therapies  Ice (in a bag, applied over clothing at the surgical site), elevation, rest, meditation, massage, distraction (music, TV, play) walking and mild exercise Splinting the abdomen with pillows +  Non-Opioid Medications Acetaminophen (Tylenol) Non-steroidal anti-inflammatory drugs (NSAIDS) Aspirin, Ibuprofen (Motrin, Advil) Naproxen (Aleve) Take these as needed, when you feel pain. Both acetaminophen and NSAIDs help to decrease pain and swelling (inflammation).      My pain is hard to ignore and is more noticeable even when I rest.  My pain interferes with  my usual activities.  Non-Medication Therapies  +  Non-Opioid medications  Take on a regular schedule (around-the-clock) instead of as needed. (For example, Tylenol every 6 hours at 9:00 am, 3:00 pm, 9:00 pm, 3:00 am and Motrin every 6 hours at 12:00 am, 6:00 am, 12:00 pm, 6:00 pm)         I am focused on my pain, and I am not doing my daily activities.  I am groaning in pain, and I cannot sleep. I am unable to do anything.  My pain is as bad as it could be, and nothing else matters.  Non-Medication Therapies  +  Around-the-Clock Non-Opioid Medications  +  Short-acting opioids  Opioids should be used with other medications to manage severe pain. Opioids block pain and give a feeling of euphoria (feel high). Addiction, a serious side effect of opioids, is rare with short-term (a few days) use.  Examples of short-acting opioids include: Tramadol (Ultram), Hydrocodone (Norco, Vicodin), Hydromorphone (Dilaudid), Oxycodone (Oxycontin)     The above directions have been adapted from the   [employer] Surgical Patient Education Program.  Please refer to the ACS website if needed: https://www.facs.org/-/media/files/education/patient-ed/cholesys.ashx.   Geraldyne Barraclough, MD [facility] [address], [address], [place], [place]  [address] ?  [address] [address], [place], [place]   [address] [phone] ? [phone] ? FAX [phone] Web site: [email or web address]  

## 2023-01-23 ENCOUNTER — Encounter (HOSPITAL_COMMUNITY): Payer: Self-pay | Admitting: Surgery

## 2023-01-23 DIAGNOSIS — K3189 Other diseases of stomach and duodenum: Secondary | ICD-10-CM | POA: Diagnosis not present

## 2023-01-23 MED ORDER — IBUPROFEN 400 MG PO TABS: 400.0000 mg | ORAL_TABLET | Freq: Three times a day (TID) | ORAL | Status: DC | PRN

## 2023-01-23 NOTE — TOC CM/SW Note (Signed)
Transition of Care [facility]) Screening Note  Patient Details  Name: Zoà Leist Date of Birth: 1986-05-08  Transition of Care [facility]) CM/SW Contact:    Sherie Don, LCSW Phone Number: 01/23/2023, 8:55 AM  Transition of Care Department [facility]) has reviewed patient and no TOC needs have been identified at this time. We will continue to monitor patient advancement through interdisciplinary progression rounds. If new patient transition needs arise, please place a TOC consult.

## 2023-01-23 NOTE — Progress Notes (Signed)
Discharge instructions discussed with patient and family, verbalized agreement and understanding 

## 2023-01-23 NOTE — BH Specialist Note (Addendum)
Integrated Behavioral Health via Telemedicine Visit  02/06/2023 Rachel [name] [id number]  Number of [place] Clinician visits: Additional Visit  Session Start time: [id number]   Session End time: 1130  Total time in minutes: 13   Referring Provider: Lynnda Shields, MD Patient/Family location: [facility] Provider location: [facility] [place] [facility]  All persons participating in visit: [name] and [place]   Types of Service: Individual psychotherapy and Telephone visit  I connected with Rachel [name] [name]'s  n/a  via  Telephone or Video Enabled Telemedicine Application  (Video is Caregility application) and verified that I am speaking with the correct person using two identifiers. Discussed confidentiality: Yes   I discussed the limitations of telemedicine and the availability of in person appointments.  Discussed there is a possibility of technology failure and discussed alternative modes of communication if that failure occurs.  I discussed that engaging in this telemedicine visit, they consent to the provision of behavioral healthcare and the services will be billed under their insurance.  Patient and/or legal guardian expressed understanding and consented to Telemedicine visit: Yes   Presenting Concerns: Patient and/or family reports the following symptoms/concerns: Processing emotions regarding more difficult recovery post-surgery than expected.  Duration of problem: Ongoing; Severity of problem: moderate  Patient and/or Family's Strengths/Protective Factors: Social connections, Concrete supports in place (healthy food, safe environments, etc.), and Sense of purpose  Goals Addressed: Patient will:  Reduce symptoms of: anxiety, depression, and stress    Demonstrate ability to: Increase healthy adjustment to current life  circumstances  Progress towards Goals: Ongoing  Interventions: Interventions utilized:  Supportive Reflection Standardized Assessments completed: Not Needed  Patient and/or Family Response: Patient agrees with treatment plan.   Assessment: Patient currently experiencing PTSD, ADHD, Psychosocial stress.   Patient may benefit from continued therapeutic interventions.  Plan: Follow up with behavioral health clinician on : Two weeks Behavioral recommendations:  -Continue daily self-coping strategies as needed throughout the day -Continue to advocate for recovery needs with clear communication with partner and family Referral(s): [place] (In Clinic)  I discussed the assessment and treatment plan with the patient and/or parent/guardian. They were provided an opportunity to ask questions and all were answered. They agreed with the plan and demonstrated an understanding of the instructions.   They were advised to call back or seek an in-person evaluation if the symptoms worsen or if the condition fails to improve as anticipated.  [place], LCSW     11/02/2022    9:23 AM 08/02/2022    2:20 PM 08/02/2022    2:11 PM 05/02/2022    2:26 PM 03/06/2022    9:40 AM  Depression screen PHQ 2/9  Decreased Interest 0 0 0 0 0  Down, Depressed, Hopeless 1 0 0 0 0  PHQ - 2 Score 1 0 0 0 0  Altered sleeping 1 2  2    Tired, decreased energy 1 2  1    Change in appetite 1 1  2    Feeling bad or failure about yourself  0 0  0   Trouble concentrating 0 1  1   Moving slowly or fidgety/restless 0 0  0   Suicidal thoughts 0 0  0   PHQ-9 Score 4 6  6        11/02/2022    9:24 AM 08/02/2022    2:20 PM 05/02/2022    2:26 PM 01/30/2022  9:56 AM  GAD 7 : Generalized Anxiety Score  Nervous, Anxious, on Edge 1 0 0 0  Control/stop worrying 0 0 0 0  Worry too much - different things 0 0 0 0  Trouble relaxing 1 1 2 1   Restless 0 0 0 0  Easily annoyed or irritable 0 0  1 0  Afraid - awful might happen 0 0 0 0  Total GAD 7 Score 2 1 3  1

## 2023-01-23 NOTE — Discharge Summary (Signed)
Patient ID: Rachel [name] 37 y.o. 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   Memory loss 12/25/2022   Paresthesia 12/25/2022   Other allergic rhinitis 12/04/2022   Allergic conjunctivitis of both eyes 12/04/2022   Mild intermittent asthma without complication [id number]   Other atopic dermatitis 12/04/2022   Allergic reaction 12/04/2022   Rash and other nonspecific skin eruption 12/04/2022   Multiple drug allergies 12/04/2022   Other adverse food reactions, not elsewhere classified, subsequent encounter 12/04/2022   Abnormal CT of the abdomen 09/23/2022   Abdominal pain 09/22/2022   Asthma, chronic 09/22/2022   HLD (hyperlipidemia) 09/22/2022   Nausea & vomiting 09/22/2022   Iron deficiency anemia 01/20/2022   Acute hyponatremia 01/18/2022   Pyelonephritis 01/18/2022   Protein-calorie malnutrition, severe 01/18/2022   sepsis secondary to pyelonephritis of left kidney 01/17/2022   Sepsis ([place]) 01/17/2022   Chronic abdominal pain 01/17/2022   Dyspnea 01/17/2022   Underweight 01/17/2022   Irritable bowel syndrome (IBS) 01/17/2022   Hypokalemia 01/17/2022   Vasomotor symptoms due to menopause 08/17/2021   S/P laparoscopic hysterectomy 07/12/2021   Preoperative exam for gynecologic surgery 06/20/2021   Adverse drug reaction 06/01/2021   Draining postoperative wound 02/28/2021   Encounter for postoperative care 02/16/2021   Endometriosis determined by laparoscopy    Pelvic pain in female 10/11/2020   Presence of subdermal contraceptive implant 10/11/2020   Dysmenorrhea 10/11/2020   Dyspareunia in female 10/11/2020   NSVT (nonsustained ventricular tachycardia) ([place]) 09/03/2020   Gastroparesis 08/18/2020   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   Palpitations 07/26/2020   Family history of connective tissue disease 07/26/2020   Myalgia 04/06/2020   Hiatal hernia 04/06/2020   Recurrent infections 02/23/2020   Weight loss 02/23/2020   Poor appetite 02/23/2020   RUQ pain 02/23/2020   Delayed gastric emptying 02/23/2020   DUB (dysfunctional uterine bleeding) 02/23/2020   Cervical radiculopathy 05/31/2018   Hyperalgesia 05/31/2018   Muscle weakness 03/13/2018   GERD (gastroesophageal reflux disease) 12/26/2017   ADHD (attention deficit hyperactivity disorder) 05/07/2012   Bipolar 1 disorder ([place]) 05/07/2012     Discharge Diagnoses:  Patient Active Problem List   Diagnosis Date Noted   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   Memory loss 12/25/2022   Paresthesia 12/25/2022   Other allergic rhinitis 12/04/2022   Allergic conjunctivitis of both eyes 12/04/2022   Mild intermittent asthma without complication [id number]   Other atopic dermatitis 12/04/2022   Allergic reaction 12/04/2022   Rash and other nonspecific skin eruption 12/04/2022   Multiple drug allergies 12/04/2022   Other adverse food reactions, not elsewhere classified, subsequent encounter 12/04/2022   Abnormal CT of the abdomen 09/23/2022   Abdominal pain 09/22/2022   Asthma, chronic 09/22/2022   HLD (hyperlipidemia) 09/22/2022   Nausea & vomiting 09/22/2022   Iron deficiency anemia 01/20/2022   Acute hyponatremia 01/18/2022   Pyelonephritis 01/18/2022   Protein-calorie malnutrition, severe 01/18/2022   sepsis secondary to pyelonephritis of left kidney 01/17/2022   Sepsis ([place]) 01/17/2022   Chronic abdominal pain 01/17/2022   Dyspnea 01/17/2022   Underweight 01/17/2022   Irritable bowel syndrome (IBS) 01/17/2022   Hypokalemia 01/17/2022   Vasomotor symptoms due to menopause 08/17/2021   S/P laparoscopic hysterectomy 07/12/2021   Preoperative exam for gynecologic surgery 06/20/2021   Adverse drug reaction 06/01/2021  Draining  postoperative wound 02/28/2021   Encounter for postoperative care 02/16/2021   Endometriosis determined by laparoscopy    Pelvic pain in female 10/11/2020   Presence of subdermal contraceptive implant 10/11/2020   Dysmenorrhea 10/11/2020   Dyspareunia in female 10/11/2020   NSVT (nonsustained ventricular tachycardia) ([place]) 09/03/2020   Gastroparesis 08/18/2020   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   Palpitations 07/26/2020   Family history of connective tissue disease 07/26/2020   Myalgia 04/06/2020   Hiatal hernia 04/06/2020   Recurrent infections 02/23/2020   Weight loss 02/23/2020   Poor appetite 02/23/2020   RUQ pain 02/23/2020   Delayed gastric emptying 02/23/2020   DUB (dysfunctional uterine bleeding) 02/23/2020   Cervical radiculopathy 05/31/2018   Hyperalgesia 05/31/2018   Muscle weakness 03/13/2018   GERD (gastroesophageal reflux disease) 12/26/2017   ADHD (attention deficit hyperactivity disorder) 05/07/2012   Bipolar 1 disorder ([place]) 05/07/2012    Operations: Procedure(s): LAPAROSCOPIC GASTRIC PEXY  Admission Condition: good  Discharged Condition: good  Indication for Admission: Gastric volvulus in situ  Hospital Course: Ms. Tollefsen presented for laparoscopic gastric pexy and was discharged the following day  Consults: None  Significant Diagnostic Studies: None  Treatments: surgery: as above  Disposition: Home  Patient Instructions:  Allergies as of 01/23/2023       Reactions   Amitriptyline Anaphylaxis   Amphetamine-dextroamphetamine Other (See Comments)   Triggered bipolar, caused depression, and suicidal thoughts   Lupron [leuprolide] Anaphylaxis   Orilissa [elagolix] Anaphylaxis   Penicillins Anaphylaxis, Swelling, Other (See Comments)   Potential for anaphylaxis confirmed. Tolerates Cefphalosporins No "-cillins"!!!! Did it involve swelling of the face/tongue/throat, SOB, or low BP? Yes Did it involve sudden or severe rash/hives, skin  peeling, or any reaction on the inside of your mouth or nose? Unknown Did you need to seek medical attention at a hospital or doctor's office? Unknown When did it last happen? teenager      If all above answers are "NO", may proceed with cephalosporin use.   Wound Dressing Adhesive Rash   Betadine [povidone-iodine] Swelling, Rash   Chlorhexidine Hives   DuraPrep    Doxycycline Itching, Nausea And Vomiting, Other (See Comments)   Caused high fever, also   Mixed Ragweed Other (See Comments)   Unknown, Mother and pt state she can't have    Olanzapine Other (See Comments)   Caused mania   Other Other (See Comments)   Stimulants, mood stabilizers, and antidepressants = Triggered bipolar, caused depression, and suicidal thoughts   Prozac [fluoxetine Hcl] Other (See Comments)   Triggered bipolar, caused depression, and suicidal thoughts   Stevia Glycerite Extract [flavoring Agent] Other (See Comments)   Unknown reaction; mother and pt confirm any Artificial Sweeteners cause reaction   Tape Other (See Comments)   Paper tape is tolerated   Triple Antibiotic Pain Relief [neomy-bacit-polymyx-pramoxine] Hives   Reglan [metoclopramide] Anxiety, Other (See Comments)   Triggered bipolar, caused depression, suicidal thoughts, muscle twitching, stiffness, and high anxiety         Medication List     TAKE these medications    acetaminophen 500 MG tablet Commonly known as: TYLENOL Take 500-1,000 mg by mouth every 6 (six) hours as needed for moderate pain or mild pain.   albuterol 108 (90 Base) MCG/ACT inhaler Commonly known as: VENTOLIN HFA Inhale 1-2 puffs into the lungs every 6 (six) hours as needed for wheezing or shortness of breath.   albuterol (2.5 MG/3ML) 0.083% nebulizer solution Commonly known as: PROVENTIL Take 3 mLs (2.5 mg  total) by nebulization every 6 (six) hours as needed for wheezing or shortness of breath. Max 4 doses per day   atorvastatin 20 MG tablet Commonly known as:  LIPITOR TAKE 1 TABLET BY MOUTH DAILY   B COMPLEX PO Take 1 capsule by mouth every evening.   Calcium-Magnesium-Vitamin D 600-40-500 MG-MG-UNIT Tb24 Take 1 tablet by mouth every evening.   cromolyn 4 % ophthalmic solution Commonly known as: OPTICROM Place 1 drop into both eyes 4 (four) times daily as needed (itchy/watery eyes).   diphenhydrAMINE 25 MG tablet Commonly known as: BENADRYL Take 25 mg by mouth every 6 (six) hours as needed for allergies or itching.   EPINEPHrine 0.3 mg/0.3 mL Soaj injection Commonly known as: EPI-PEN Inject 0.3 mg into the muscle as needed for anaphylaxis.   estrogens (conjugated) 0.625 MG tablet Commonly known as: Premarin Take 1 tablet (0.625 mg total) by mouth daily. Take daily for 21 days then do not take for 7 days. What changed: when to take this   fexofenadine 180 MG tablet Commonly known as: ALLEGRA Take 180 mg by mouth every evening.   Fish Oil 1200 MG Caps Take 1,200 mg by mouth daily.   fluticasone 50 MCG/ACT nasal spray Commonly known as: FLONASE Place 1 spray into both nostrils 2 (two) times daily as needed (nasal congestion).   ibuprofen 200 MG tablet Commonly known as: ADVIL Take 400-800 mg by mouth every 8 (eight) hours as needed (pain.).   Iron (Ferrous Sulfate) 325 (65 Fe) MG Tabs Take 325 mg by mouth daily. What changed: when to take this   ketorolac 0.5 % ophthalmic solution Commonly known as: ACULAR Place 1 drop into the right eye 4 (four) times daily.   Linzess 290 MCG Caps capsule Generic drug: linaclotide TAKE 1 CAPSULE BY MOUTH DAILY BEFORE BREAKFAST What changed: See the new instructions.   Melatonin 10 MG Tabs Take 10 mg by mouth at bedtime.   methocarbamol 750 MG tablet Commonly known as: Robaxin-750 Take 1 tablet (750 mg total) by mouth 4 (four) times daily.   MiraLax 17 GM/SCOOP powder Generic drug: polyethylene glycol powder Take 17-34 g by mouth daily. Mix 17 g of powder into water and drink  once a day   oxyCODONE-acetaminophen 5-325 MG tablet Commonly known as: Percocet Take 1 tablet by mouth every 4 (four) hours as needed for severe pain.   Pataday 0.2 % Soln Generic drug: Olopatadine HCl Place 1-2 drops into both eyes 3 (three) times daily as needed (allergies eye).   promethazine 12.5 MG tablet Commonly known as: PHENERGAN Take 12.5 mg by mouth every 6 (six) hours as needed for nausea.   sennosides-docusate sodium 8.6-50 MG tablet Commonly known as: SENOKOT-S Take 2-3 tablets by mouth daily as needed for constipation.   sulfacetamide 10 % ophthalmic solution Commonly known as: BLEPH-10 Place 1-2 drops into the right eye every 3 (three) hours. Use while awake What changed:  when to take this reasons to take this   triamcinolone cream 0.1 % Commonly known as: KENALOG APPLY TO AFFECTED AREA(S) TWO TIMES A DAY What changed: See the new instructions.   VITAMIN A PO Take 3,000 mcg by mouth every evening.   zinc sulfate 220 (50 Zn) MG capsule Take 220 mg by mouth every evening.        Activity: no heavy lifting for 4 weeks Diet: regular diet Wound Care: keep wound clean and dry  Follow-up:  With Dr. Thermon Leyland in 4 weeks.  Signed: Nickola Major  [facility] Bariat[facility], [facility] Minimally Invasive Surgery [facility], [place]   01/23/2023, 10:58 AM

## 2023-01-25 ENCOUNTER — Other Ambulatory Visit: Payer: Self-pay | Admitting: Family Medicine

## 2023-01-25 DIAGNOSIS — D509 Iron deficiency anemia, unspecified: Secondary | ICD-10-CM

## 2023-01-31 ENCOUNTER — Ambulatory Visit (INDEPENDENT_AMBULATORY_CARE_PROVIDER_SITE_OTHER): Payer: Medicaid Other

## 2023-01-31 DIAGNOSIS — R300 Dysuria: Secondary | ICD-10-CM | POA: Diagnosis not present

## 2023-01-31 DIAGNOSIS — J309 Allergic rhinitis, unspecified: Secondary | ICD-10-CM

## 2023-01-31 DIAGNOSIS — R3915 Urgency of urination: Secondary | ICD-10-CM | POA: Diagnosis not present

## 2023-01-31 DIAGNOSIS — N390 Urinary tract infection, site not specified: Secondary | ICD-10-CM | POA: Diagnosis not present

## 2023-02-01 ENCOUNTER — Other Ambulatory Visit: Payer: Self-pay

## 2023-02-01 ENCOUNTER — Ambulatory Visit: Payer: Self-pay

## 2023-02-01 DIAGNOSIS — L309 Dermatitis, unspecified: Secondary | ICD-10-CM

## 2023-02-01 NOTE — Telephone Encounter (Signed)
Chief Complaint: Post op nausea/vomiting Symptoms: vomiting x 2 past 24 hours, keeping liquids down, nausea, pain manageable 5-8 (has oxycodone post op ordered, not taking unless really need it) Frequency: since 48 hours after surgery on 01/22/23 Pertinent Negatives: Patient denies fever, other symptoms Disposition: [] ED /[] Urgent Care (no appt availability in office) / [] Appointment(In office/virtual)/ []  [facility]/ [] Home Care/ [] Refused Recommended Disposition /[] [facility]/ [x]  Follow-up with PCP Additional Notes: Patient says she's calling to ask if Dr. Margarita Rana would refill the promethazine on her file, since the pharmacy said it's too old and no refills on file. She says she's needing it to help with nausea, she has gastroparesis. I asked did she speak to the surgeon and she says he prescribed a muscle relaxant for her that is to help, but with the gastroparesis she has, she feels like having the phenergan would do better for her so that she can tolerate some pureed foods and some solids. Advised I will send this to Dr. Margarita Rana.   Pharmacy  [facility] [id number], [place], [place] [address] Phone: [phone]  Fax: [phone]    Reason for Disposition  [1] Caller has URGENT question AND [2] triager unable to answer question  Answer Assessment - Initial Assessment Questions 1. SYMPTOM: "What's the main symptom you're concerned about?" (e.g., pain, fever, vomiting)     Nausea and vomiting 2. ONSET: "When did symptoms start?"     48 hours after surgery 3. SURGERY: "What surgery did you have?"     Gastropexi surgery 4. DATE of SURGERY: "When was the surgery?"      01/22/23 5. ANESTHESIA: " What type of anesthesia did you have?" (e.g., general, spinal, epidural, local)     General 6. PAIN: "Is there any pain?" If Yes, ask: "How bad is it?"  (Scale 1-10; or mild, moderate, severe)     Chronic pain  baseline 5-7, now 5-8 7. FEVER: "Do you have a fever?" If Yes, ask: "What is your temperature, how was it measured, and when did it start?"     No 8. VOMITING: "Is there any vomiting?" If Yes, ask: "How many times?"     Yes a couple times past 24 hours, keep liquids down, not able to keep solids due to gastroparesis 9. BLEEDING: "Is there any bleeding?" If Yes, ask: "How much?" and "Where?"     No 10. OTHER SYMPTOMS: "Do you have any other symptoms?" (e.g., drainage from wound, painful urination, constipation)      Nausea  Protocols used: Post-Op Symptoms and Questions-A-AH

## 2023-02-01 NOTE — Telephone Encounter (Signed)
Requested medication (s) are due for refill today -yes  Requested medication (s) are on the active medication list -yes  Future visit scheduled -yes  Last refill: 08/22/22 45g 1RF  Notes to clinic: non delegated Rx  Requested Prescriptions  Pending Prescriptions Disp Refills   triamcinolone cream (KENALOG) 0.1 % 45 g 1     Not Delegated - Dermatology:  Corticosteroids Failed - 02/01/2023  1:22 PM      Failed - This refill cannot be delegated      Passed - Valid encounter within last 12 months    Recent Outpatient Visits           3 months ago Brain fog   [place], Enobong, MD   6 months ago Aortic atherosclerosis [facility])   [place] Charlott Rakes, MD   9 months ago Recurrent pyelonephritis   [place], Enobong, MD   1 year ago Irritable bowel syndrome with both constipation and diarrhea   [place] Charlott Rakes, MD   1 year ago Anaphylaxis, sequela   [place] Dorna Mai, MD       Future Appointments             In 1 month Maudie Mercury Albin Felling, DO [place] [facility] [place] [facility] [place]   In 2 months Charlott Rakes, [place]               Requested Prescriptions  Pending Prescriptions Disp Refills   triamcinolone cream (KENALOG) 0.1 % 45 g 1     Not Delegated - Dermatology:  Corticosteroids Failed - 02/01/2023  1:22 PM      Failed - This refill cannot be delegated      Passed - Valid encounter within last 12 months    Recent Outpatient Visits           3 months ago Brain fog   [place], Enobong, MD   6 months ago Aortic atherosclerosis [facility])   [place] Charlott Rakes, MD   9 months ago Recurrent pyelonephritis   [place], Enobong, MD   1 year ago Irritable bowel syndrome with both constipation and diarrhea   [place], Enobong, MD   1 year ago Anaphylaxis, sequela   [place] Dorna Mai, MD       Future Appointments             In 1 month Maudie Mercury Albin Felling, DO [place] [facility] [place] [facility] [place]   In 2 months Charlott Rakes, [place]

## 2023-02-02 MED ORDER — TRIAMCINOLONE ACETONIDE 0.1 % EX CREA: 1.0000 | TOPICAL_CREAM | Freq: Two times a day (BID) | CUTANEOUS | 1 refills | Status: DC | PRN

## 2023-02-02 NOTE — Telephone Encounter (Signed)
Pt requesting script for phenergan it is not on her current med list or prescribed by you

## 2023-02-03 MED ORDER — PROMETHAZINE HCL 12.5 MG PO TABS: 12.5000 mg | ORAL_TABLET | Freq: Three times a day (TID) | ORAL | 1 refills | Status: DC | PRN

## 2023-02-03 NOTE — Telephone Encounter (Signed)
Done

## 2023-02-06 ENCOUNTER — Ambulatory Visit (INDEPENDENT_AMBULATORY_CARE_PROVIDER_SITE_OTHER): Payer: Medicaid Other | Admitting: Clinical

## 2023-02-06 DIAGNOSIS — F909 Attention-deficit hyperactivity disorder, unspecified type: Secondary | ICD-10-CM | POA: Diagnosis not present

## 2023-02-06 DIAGNOSIS — F4310 Post-traumatic stress disorder, unspecified: Secondary | ICD-10-CM

## 2023-02-06 DIAGNOSIS — Z658 Other specified problems related to psychosocial circumstances: Secondary | ICD-10-CM

## 2023-02-08 ENCOUNTER — Ambulatory Visit (INDEPENDENT_AMBULATORY_CARE_PROVIDER_SITE_OTHER): Payer: Medicaid Other

## 2023-02-08 DIAGNOSIS — J309 Allergic rhinitis, unspecified: Secondary | ICD-10-CM | POA: Diagnosis not present

## 2023-02-12 ENCOUNTER — Other Ambulatory Visit: Payer: Self-pay | Admitting: Gastroenterology
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
## 2023-02-13 ENCOUNTER — Ambulatory Visit (INDEPENDENT_AMBULATORY_CARE_PROVIDER_SITE_OTHER): Payer: Medicaid Other

## 2023-02-13 DIAGNOSIS — J309 Allergic rhinitis, unspecified: Secondary | ICD-10-CM

## 2023-02-22 ENCOUNTER — Ambulatory Visit (INDEPENDENT_AMBULATORY_CARE_PROVIDER_SITE_OTHER): Payer: Medicaid Other

## 2023-02-22 DIAGNOSIS — J309 Allergic rhinitis, unspecified: Secondary | ICD-10-CM | POA: Diagnosis not present

## 2023-02-23 ENCOUNTER — Ambulatory Visit: Payer: Medicaid Other | Admitting: Neurology

## 2023-02-23 VITALS — BP 114/78 | HR 64 | Ht 62.6 in | Wt 111.0 lb

## 2023-02-23 DIAGNOSIS — G43709 Chronic migraine without aura, not intractable, without status migrainosus: Secondary | ICD-10-CM | POA: Diagnosis not present

## 2023-02-23 DIAGNOSIS — R413 Other amnesia: Secondary | ICD-10-CM

## 2023-02-23 DIAGNOSIS — R202 Paresthesia of skin: Secondary | ICD-10-CM

## 2023-02-23 MED ORDER — RIZATRIPTAN BENZOATE 10 MG PO TBDP: ORAL_TABLET | ORAL | 5 refills | Status: AC

## 2023-02-23 NOTE — Progress Notes (Signed)
Chief Complaint  Patient presents with   Procedure    Rm EMG/NCV 4.      ASSESSMENT AND PLAN  [name] is a 37 y.o. female   Multiple joint pain, muscle achy pain, neck pain radiating pain to left upper extremity,  EMG nerve conduction study is normal, in specific, there is no focal neuropathy, intrinsic muscle disease  Chronic migraine headache with aura  Maxalt as needed for abortive treatment  Continue follow-up with primary care, only return to clinic for new issues  DIAGNOSTIC DATA (LABS, IMAGING, TESTING) - I reviewed patient records, labs, notes, testing and imaging myself where available.   MEDICAL HISTORY:  Rachel [name], is a 37 year old female, accompanied by her mother, seen in request by primary care physician Dr. Alvis Lemmings, Odette Horns, for evaluation of brain fog, left upper extremity paresthesia weakness  I reviewed and summarized the referring note.PMHX. Bipolar, ADHD,  HLD  She had long history of mood disorder, went on disability since age 28, completed high school, had a few months of college, over the years, she suffered multiple joints pain, hyperflexibility, muscle achy pain, also reported gastric process, malnutrition  Since 2020, she reported brain fog, difficult to gather her thoughts together, mild short-term difficulties, MoCA examination 25/30 today, missed 2 out of 5 recalls, has difficulty with attention  Also complains of neck tension, radiating discomfort to left shoulder, left arm paresthesia, subjective weakness,  Update February 23, 2023: She return for electrodiagnostic study today, which was essentially normal, in specific, there was no evidence of focal neuropathy, right lumbar cervical radiculopathy,  She complains of frequent headache, every other day exacerbate more severe headache, light noise sensitivity, sometimes proceeding by visual aura, nauseous, lasting for few hours so even longer   PHYSICAL EXAM:    Vitals:   02/23/23 0942  BP: 114/78  Pulse: 64  Weight: 111 lb (50.3 kg)  Height: 5' 2.6" (1.59 m)      Body mass index is 19.91 kg/m.  PHYSICAL EXAMNIATION:  Gen: NAD, conversant, well nourised, well groomed                     Cardiovascular: Regular rate rhythm, no peripheral edema, warm, nontender. Eyes: Conjunctivae clear without exudates or hemorrhage Neck: Supple, no carotid bruits. Pulmonary: Clear to auscultation bilaterally   NEUROLOGICAL EXAM:  MENTAL STATUS: Speech/cognition: Awake, alert, oriented to history taking and casual conversation     12/25/2022    8:20 AM  Montreal Cognitive Assessment   Visuospatial/ Executive (0/5) 5  Naming (0/3) 3  Attention: Read list of digits (0/2) 0  Attention: Read list of letters (0/1) 1  Attention: Serial 7 subtraction starting at 100 (0/3) 3  Language: Repeat phrase (0/2) 1  Language : Fluency (0/1) 1  Abstraction (0/2) 2  Delayed Recall (0/5) 3  Orientation (0/6) 6  Total 25  Adjusted Score (based on education) 25    CRANIAL NERVES: CN II: Visual fields are full to confrontation. Pupils are round equal and briskly reactive to light. CN III, IV, VI: extraocular movement are normal. No ptosis. CN V: Facial sensation is intact to light touch CN VII: Face is symmetric with normal eye closure  CN VIII: Hearing is normal to causal conversation. CN IX, X: Phonation is normal. CN XI: Head turning and shoulder shrug are intact  MOTOR: Moves 4 extremities without difficulty  REFLEXES: Reflexes are 2+ and symmetric at the biceps, triceps, knees, and ankles. Plantar responses are  flexor.  SENSORY: Intact to light touch, pinprick and vibratory sensation are intact in fingers and toes.  COORDINATION: There is no trunk or limb dysmetria noted.  GAIT/STANCE: Posture is normal.   REVIEW OF SYSTEMS:  Full 14 system review of systems performed and notable only for as above All other review of systems were  negative.   ALLERGIES: Allergies  Allergen Reactions   Amitriptyline Anaphylaxis   Amphetamine-Dextroamphetamine Other (See Comments)    Triggered bipolar, caused depression, and suicidal thoughts   Lupron [Leuprolide] Anaphylaxis   Orilissa [Elagolix] Anaphylaxis   Penicillins Anaphylaxis, Swelling and Other (See Comments)    Potential for anaphylaxis confirmed. Tolerates Cefphalosporins No "-cillins"!!!! Did it involve swelling of the face/tongue/throat, SOB, or low BP? Yes Did it involve sudden or severe rash/hives, skin peeling, or any reaction on the inside of your mouth or nose? Unknown Did you need to seek medical attention at a hospital or doctor's office? Unknown When did it last happen? teenager      If all above answers are "NO", may proceed with cephalosporin use.   Wound Dressing Adhesive Rash   Betadine [Povidone-Iodine] Swelling and Rash   Chlorhexidine Hives    DuraPrep    Doxycycline Itching, Nausea And Vomiting and Other (See Comments)    Caused high fever, also   Mixed Ragweed Other (See Comments)    Unknown, Mother and pt state she can't have    Olanzapine Other (See Comments)    Caused mania   Other Other (See Comments)    Stimulants, mood stabilizers, and antidepressants = Triggered bipolar, caused depression, and suicidal thoughts   Prozac [Fluoxetine Hcl] Other (See Comments)    Triggered bipolar, caused depression, and suicidal thoughts   Stevia Glycerite Extract [Flavoring Agent] Other (See Comments)    Unknown reaction; mother and pt confirm any Artificial Sweeteners cause reaction   Tape Other (See Comments)    Paper tape is tolerated   Triple Antibiotic Pain Relief [Neomy-Bacit-Polymyx-Pramoxine] Hives   Reglan [Metoclopramide] Anxiety and Other (See Comments)    Triggered bipolar, caused depression, suicidal thoughts, muscle twitching, stiffness, and high anxiety     HOME MEDICATIONS: Current Outpatient Medications  Medication Sig Dispense  Refill   acetaminophen (TYLENOL) 500 MG tablet Take 500-1,000 mg by mouth every 6 (six) hours as needed for moderate pain or mild pain.     albuterol (PROVENTIL) (2.5 MG/3ML) 0.083% nebulizer solution Take 3 mLs (2.5 mg total) by nebulization every 6 (six) hours as needed for wheezing or shortness of breath. Max 4 doses per day 75 mL 0   albuterol (VENTOLIN HFA) 108 (90 Base) MCG/ACT inhaler Inhale 1-2 puffs into the lungs every 6 (six) hours as needed for wheezing or shortness of breath. 8 g 1   atorvastatin (LIPITOR) 20 MG tablet TAKE 1 TABLET BY MOUTH DAILY 90 tablet 1   B Complex Vitamins (B COMPLEX PO) Take 1 capsule by mouth every evening.     Calcium-Magnesium-Vitamin D 600-40-500 MG-MG-UNIT TB24 Take 1 tablet by mouth every evening.     cromolyn (OPTICROM) 4 % ophthalmic solution Place 1 drop into both eyes 4 (four) times daily as needed (itchy/watery eyes). 10 mL 3   diphenhydrAMINE (BENADRYL) 25 MG tablet Take 25 mg by mouth every 6 (six) hours as needed for allergies or itching.     EPINEPHrine 0.3 mg/0.3 mL IJ SOAJ injection Inject 0.3 mg into the muscle as needed for anaphylaxis. 2 each 1   estrogens, conjugated, (PREMARIN) 0.625 MG  tablet Take 1 tablet (0.625 mg total) by mouth daily. Take daily for 21 days then do not take for 7 days. (Patient taking differently: Take 0.625 mg by mouth every evening. Take daily for 21 days then do not take for 7 days.) 30 tablet 11   ferrous sulfate 325 (65 FE) MG tablet TAKE 1 TABLET BY MOUTH DAILY 60 tablet 2   fexofenadine (ALLEGRA) 180 MG tablet Take 180 mg by mouth every evening.     fluticasone (FLONASE) 50 MCG/ACT nasal spray Place 1 spray into both nostrils 2 (two) times daily as needed (nasal congestion). (Patient not taking: Reported on 01/15/2023) 16 g 3   ibuprofen (ADVIL) 200 MG tablet Take 400-800 mg by mouth every 8 (eight) hours as needed (pain.).     ketorolac (ACULAR) 0.5 % ophthalmic solution Place 1 drop into the right eye 4 (four)  times daily. 3 mL 0   LINZESS 290 MCG CAPS capsule TAKE 1 CAPSULE BY MOUTH DAILY BEFORE BREAKFAST 30 capsule 0   Melatonin 10 MG TABS Take 10 mg by mouth at bedtime.     methocarbamol (ROBAXIN-750) 750 MG tablet Take 1 tablet (750 mg total) by mouth 4 (four) times daily. 30 tablet 0   MIRALAX 17 GM/SCOOP powder Take 17-34 g by mouth daily. Mix 17 g of powder into water and drink once a day     Olopatadine HCl (PATADAY) 0.2 % SOLN Place 1-2 drops into both eyes 3 (three) times daily as needed (allergies eye).     Omega-3 Fatty Acids (FISH OIL) 1200 MG CAPS Take 1,200 mg by mouth daily.     oxyCODONE-acetaminophen (PERCOCET) 5-325 MG tablet Take 1 tablet by mouth every 4 (four) hours as needed for severe pain. 20 tablet 0   promethazine (PHENERGAN) 12.5 MG tablet Take 1 tablet (12.5 mg total) by mouth every 8 (eight) hours as needed for nausea. 30 tablet 1   sennosides-docusate sodium (SENOKOT-S) 8.6-50 MG tablet Take 2-3 tablets by mouth daily as needed for constipation.     sulfacetamide (BLEPH-10) 10 % ophthalmic solution Place 1-2 drops into the right eye every 3 (three) hours. Use while awake (Patient taking differently: Place 1-2 drops into the right eye every 3 (three) hours as needed (eye irritation.). Use while awake) 5 mL 0   triamcinolone cream (KENALOG) 0.1 % Apply 1 Application topically 2 (two) times daily as needed (rash, itching). 45 g 1   VITAMIN A PO Take 3,000 mcg by mouth every evening.     zinc sulfate 220 (50 Zn) MG capsule Take 220 mg by mouth every evening.     No current facility-administered medications for this visit.    PAST MEDICAL HISTORY: Past Medical History:  Diagnosis Date   ADHD (attention deficit hyperactivity disorder)    Anemia    Anxiety    Asthma    Bipolar disorder (HCC)    Complication of anesthesia    slow to wake up    Delayed gastric emptying 02/23/2020   Depression    DUB (dysfunctional uterine bleeding) 02/23/2020   Eczema    Ehlers-Danlos  syndrome    Endometriosis    Gastroparesis    GERD (gastroesophageal reflux disease)    Heart murmur    Hiatal hernia 04/06/2020   Myalgia 04/06/2020   Nausea & vomiting 09/22/2022   Parasitic infection 02/23/2020   Pneumonia    Poor appetite 02/23/2020   Recurrent upper respiratory infection (URI)    RUQ pain 02/23/2020  Vitamin D deficiency    Weight loss 02/23/2020    PAST SURGICAL HISTORY: Past Surgical History:  Procedure Laterality Date   barium study     COLONOSCOPY     CYSTOSCOPY N/A 07/12/2021   Procedure: CYSTOSCOPY;  Surgeon: Warden Fillers, MD;  Location: [facility] OR;  Service: Gynecology;  Laterality: N/A;   ESOPHAGOGASTRODUODENOSCOPY  2011   and a ph study as well.    LAPAROSCOPIC GASTRIC RESECTION N/A 01/22/2023   Procedure: LAPAROSCOPIC GASTRIC PEXY;  Surgeon: Quentin Ore, MD;  Location: [facility] ORS;  Service: General;  Laterality: N/A;   LAPAROSCOPY N/A 01/26/2021   Procedure: LAPAROSCOPY DIAGNOSTIC;  Surgeon: Warden Fillers, MD;  Location: [facility];  Service: Gynecology;  Laterality: N/A;   NASAL SEPTUM SURGERY     SINOSCOPY     TONGUE FLAP     TOTAL LAPAROSCOPIC HYSTERECTOMY WITH SALPINGECTOMY Bilateral 07/12/2021   Procedure: TOTAL LAPAROSCOPIC HYSTERECTOMY WITH SALPINGECTOMY, OOPHORECTOMY;  Surgeon: Warden Fillers, MD;  Location: [facility] OR;  Service: Gynecology;  Laterality: Bilateral;   UPPER GASTROINTESTINAL ENDOSCOPY     WISDOM TOOTH EXTRACTION      FAMILY HISTORY: Family History  Problem Relation Age of Onset   Eczema Mother    Urticaria Mother    Allergic rhinitis Mother    Colon polyps Mother    Depression Mother    Anxiety disorder Mother    Hypertension Mother    Hyperlipidemia Mother    Asthma Mother    Endometriosis Mother    Fibroids Mother    Fibromyalgia Mother    Migraines Mother    Irritable bowel syndrome Mother    Allergic rhinitis Father    Depression Father    Hypertension Father    Hyperlipidemia  Father    Bipolar disorder Father    Urticaria Sister    Allergic rhinitis Sister    Bipolar disorder Sister    ADD / ADHD Sister    Asthma Sister    Endometriosis Sister    Seizures Sister    Bipolar disorder Sister    Anxiety disorder Sister    Heart disease Maternal Grandmother    Endometriosis Maternal Grandmother    Fibroids Maternal Grandmother    Hyperlipidemia Maternal Grandmother    Hypertension Maternal Grandmother    Diabetes Maternal Grandmother    Fibromyalgia Maternal Grandmother    Heart disease Maternal Grandfather    Diabetes Maternal Grandfather    Heart disease Paternal Grandmother    Heart disease Paternal Grandfather    Colon cancer Maternal Great-grandmother    Pancreatic cancer Neg Hx    Esophageal cancer Neg Hx    Stomach cancer Neg Hx     SOCIAL HISTORY: Social History   Socioeconomic History   Marital status: Divorced    Spouse name: Not on file   Number of children: Not on file   Years of education: Not on file   Highest education level: Not on file  Occupational History   Not on file  Tobacco Use   Smoking status: Never    Passive exposure: Never   Smokeless tobacco: Never  Vaping Use   Vaping Use: Never used  Substance and Sexual Activity   Alcohol use: Yes    Alcohol/week: 1.0 standard drink of alcohol    Types: 1 Glasses of wine per week    Comment: 1 glass of wine every other week   Drug use: No   Sexual activity: Not Currently  Other Topics Concern  Not on file  Social History Narrative   Not on file   Social Determinants of Health   Financial Resource Strain: Not on file  Food Insecurity: No Food Insecurity (01/22/2023)   Hunger Vital Sign    Worried About Running Out of Food in the Last Year: Never true    Ran Out of Food in the Last Year: Never true  Transportation Needs: No Transportation Needs (01/22/2023)   PRAPARE - Administrator, Civil Service (Medical): No    Lack of Transportation (Non-Medical):  No  Physical Activity: Not on file  Stress: Not on file  Social Connections: Not on file  Intimate Partner Violence: Not At Risk (01/22/2023)   Humiliation, Afraid, Rape, and Kick questionnaire    Fear of Current or Ex-Partner: No    Emotionally Abused: No    Physically Abused: No    Sexually Abused: No      Levert Feinstein, M.D. Ph.D.  [facility] [address], [address] [place], [place] [address] Ph: [phone] Fax: [phone]  CC:  Hoy Register, MD [address] [place] [address] [place],  [place] [address]  Hoy Register, MD

## 2023-02-23 NOTE — Procedures (Signed)
Full Name: Braidyn Peace Gender: Female MRN #: [id number] Date of Birth: 01-21-1986    Visit Date: 02/23/2023 09:38 Age: 37 Years Examining Physician: Dr. Levert Feinstein Referring Physician: Dr. Levert Feinstein Height: 5 feet 2 inch History: 37 year old female with multiple joints, muscle pain, neck pain, radiating pain to upper extremity  Summary of the test: Nerve conduction study: Right sural, superficial peroneal, bilateral median, ulnar sensory responses were normal.  Right tibial, peroneal to EDB motor responses were normal.  Bilateral median, ulnar motor responses were normal.  Electromyography: Selected needle examinations of right upper, lower extremity muscles, cervical and lumbosacral paraspinal muscles were normal.  Conclusion: This is a normal study.  There is no electrodiagnostic evidence of intrinsic muscle disease, peripheral neuropathy, right cervical or lumbosacral radiculopathy.    ------------------------------- Levert Feinstein M.D. PhD  [facility] [address], [address] [place], [place] [address] Tel: [phone] Fax: [phone]  Verbal informed consent was obtained from the patient, patient was informed of potential risk of procedure, including bruising, bleeding, hematoma formation, infection, muscle weakness, muscle pain, numbness, among others.        MNC    Nerve / Sites Muscle Latency Ref. Amplitude Ref. Rel Amp Segments Distance Velocity Ref. Area    ms ms mV mV %  cm m/s m/s mVms  R Median - APB     Wrist APB 2.9 ?4.4 11.3 ?4.0 100 Wrist - APB 7   53.8     Upper arm APB 6.5  12.0  106 Upper arm - Wrist 23 64 ?49 56.0  L Median - APB     Wrist APB 3.1 ?4.4 9.8 ?4.0 100 Wrist - APB 7   47.6     Upper arm APB 6.5  9.9  101 Upper arm - Wrist 22 64 ?49 45.9  R Ulnar - ADM     Wrist ADM 2.2 ?3.3 8.9 ?6.0 100 Wrist - ADM 7   39.3     B.Elbow ADM 4.0  8.9  100 B.Elbow - Wrist 14.6 81 ?49 38.9     A.Elbow ADM 6.1  8.9  100  A.Elbow - B.Elbow 11 54 ?49 38.3  L Ulnar - ADM     Wrist ADM 2.8 ?3.3 9.2 ?6.0 100 Wrist - ADM 7   31.9     B.Elbow ADM 4.5  8.5  92.7 B.Elbow - Wrist 11 64 ?49 33.0     A.Elbow ADM 6.4  8.2  96.1 A.Elbow - B.Elbow 12.6 69 ?49 33.5  R Peroneal - EDB     Ankle EDB 5.7 ?6.5 4.2 ?2.0 100 Ankle - EDB 9   14.8     Fib head EDB 9.8  4.9  117 Fib head - Ankle 19 47 ?44 18.3     Pop fossa EDB 11.5  5.6  114 Pop fossa - Fib head 9 53 ?44 22.1         Pop fossa - Ankle      R Tibial - AH     Ankle AH 3.9 ?5.8 13.3 ?4.0 100 Ankle - AH 9   33.7     Pop fossa AH 10.8  13.1  98.8 Pop fossa - Ankle 35 51 ?41 34.2                 SNC    Nerve / Sites Rec. Site Peak Lat Ref.  Amp Ref. Segments Distance    ms ms V V  cm  R Sural - Ankle (Calf)     Calf Ankle 3.0 ?4.4 17 ?6 Calf - Ankle 14  R Superficial peroneal - Ankle     Lat leg Ankle 3.4 ?4.4 9 ?6 Lat leg - Ankle 14  R Median - Orthodromic (Dig II, Mid palm)     Dig II Wrist 2.8 ?3.4 19 ?10 Dig II - Wrist 13  L Median - Orthodromic (Dig II, Mid palm)     Dig II Wrist 2.6 ?3.4 25 ?10 Dig II - Wrist 13  R Ulnar - Orthodromic, (Dig V, Mid palm)     Dig V Wrist 2.7 ?3.1 7 ?5 Dig V - Wrist 11  L Ulnar - Orthodromic, (Dig V, Mid palm)     Dig V Wrist 2.7 ?3.1 17 ?5 Dig V - Wrist 92                 F  Wave    Nerve F Lat Ref.   ms ms  R Ulnar - ADM 21.6 ?32.0  L Ulnar - ADM 22.9 ?32.0  R Tibial - AH 43.5 ?56.0           EMG Summary Table    Spontaneous MUAP Recruitment  Muscle IA Fib PSW Fasc Other Amp Dur. Poly Pattern  R. Tibialis anterior Normal None None None _______ Normal Normal Normal Normal  R. Tibialis posterior Normal None None None _______ Normal Normal Normal Normal  R. Peroneus longus Normal None None None _______ Normal Normal Normal Normal  R. Vastus lateralis Normal None None None _______ Normal Normal Normal Normal  R. Gastrocnemius (Medial head) Normal None None None _______ Normal Normal Normal Normal  R. Lumbar  paraspinals (low) Normal None None None _______ Normal Normal Normal Normal  R. Lumbar paraspinals (mid) Normal None None None _______ Normal Normal Normal Normal  R. First dorsal interosseous Normal None None None _______ Normal Normal Normal Normal  R. Pronator teres Normal None None None _______ Normal Normal Normal Normal  R. Biceps brachii Normal None None None _______ Normal Normal Normal Normal  R. Deltoid Normal None None None _______ Normal Normal Normal Normal  R. Triceps brachii Normal None None None _______ Normal Normal Normal Normal  R. Cervical paraspinals Normal None None None _______ Normal Normal Normal Normal

## 2023-02-26 ENCOUNTER — Ambulatory Visit (INDEPENDENT_AMBULATORY_CARE_PROVIDER_SITE_OTHER): Payer: Medicaid Other | Admitting: Clinical

## 2023-02-26 DIAGNOSIS — F909 Attention-deficit hyperactivity disorder, unspecified type: Secondary | ICD-10-CM | POA: Diagnosis not present

## 2023-02-26 DIAGNOSIS — Z658 Other specified problems related to psychosocial circumstances: Secondary | ICD-10-CM

## 2023-02-26 DIAGNOSIS — F4310 Post-traumatic stress disorder, unspecified: Secondary | ICD-10-CM

## 2023-02-28 ENCOUNTER — Ambulatory Visit (INDEPENDENT_AMBULATORY_CARE_PROVIDER_SITE_OTHER): Payer: Medicaid Other

## 2023-02-28 DIAGNOSIS — R3915 Urgency of urination: Secondary | ICD-10-CM | POA: Diagnosis not present

## 2023-02-28 DIAGNOSIS — J309 Allergic rhinitis, unspecified: Secondary | ICD-10-CM

## 2023-03-01 ENCOUNTER — Ambulatory Visit: Payer: Self-pay | Admitting: *Deleted

## 2023-03-01 NOTE — Telephone Encounter (Signed)
  Chief Complaint: L knee popped 10 days ago and it's not getting better. Symptoms: Swells on and off and is very painful.   Difficult to go up and down stairs. Frequency: Popped 10 days ago. Pertinent Negatives: Patient denies N/A Disposition: ED /[] Urgent Care (no appt availability in office) / Appointment(In office/virtual)/  [facility]/ Home Care/ Refused Recommended Disposition /[] [facility]/  Follow-up with PCP Additional Notes: No apptsl with [facility] so I referred her to the [facility] Orth[facility] Unit at the [facility] location.   She was agreeable to this plan.     Message sent to Dr. Alvis Lemmings that she wants a handicapped sticker.

## 2023-03-01 NOTE — Telephone Encounter (Signed)
Reason for Disposition  [1] MODERATE pain (e.g., interferes with normal activities, limping) AND [2] present > 3 days  Answer Assessment - Initial Assessment Questions 1. LOCATION and RADIATION: "Where is the pain located?"      Left knee is hurting for 10 days.   It's hard to go up and down stairs.   It swells and is very painful.   2. QUALITY: "What does the pain feel like?"  (e.g., sharp, dull, aching, burning)     Severe  3. SEVERITY: "How bad is the pain?" "What does it keep you from doing?"   (Scale 1-10; or mild, moderate, severe)   -  MILD (1-3): doesn't interfere with normal activities    -  MODERATE (4-7): interferes with normal activities (e.g., work or school) or awakens from sleep, limping    -  SEVERE (8-10): excruciating pain, unable to do any normal activities, unable to walk     Severe   4. ONSET: "When did the pain start?" "Does it come and go, or is it there all the time?"     10 days ago 5. RECURRENT: "Have you had this pain before?" If Yes, ask: "When, and what happened then?"     No 6. SETTING: "Has there been any recent work, exercise or other activity that involved that part of the body?"      Sat. Before my birthday this happened.   It popped and had a pain. 7. AGGRAVATING FACTORS: "What makes the knee pain worse?" (e.g., walking, climbing stairs, running)     Going up and down stairs. 8. ASSOCIATED SYMPTOMS: "Is there any swelling or redness of the knee?"     Yes it swell 9. OTHER SYMPTOMS: "Do you have any other symptoms?" (e.g., chest pain, difficulty breathing, fever, calf pain)     No  10. PREGNANCY: "Is there any chance you are pregnant?" "When was your last menstrual period?"       Not asked  Protocols used: Knee Pain-A-AH

## 2023-03-02 ENCOUNTER — Other Ambulatory Visit (HOSPITAL_BASED_OUTPATIENT_CLINIC_OR_DEPARTMENT_OTHER): Payer: Self-pay

## 2023-03-02 ENCOUNTER — Ambulatory Visit (HOSPITAL_BASED_OUTPATIENT_CLINIC_OR_DEPARTMENT_OTHER): Payer: Medicaid Other

## 2023-03-02 ENCOUNTER — Other Ambulatory Visit (HOSPITAL_BASED_OUTPATIENT_CLINIC_OR_DEPARTMENT_OTHER): Payer: Self-pay | Admitting: Student

## 2023-03-02 ENCOUNTER — Encounter (HOSPITAL_BASED_OUTPATIENT_CLINIC_OR_DEPARTMENT_OTHER): Payer: Self-pay | Admitting: Student

## 2023-03-02 ENCOUNTER — Ambulatory Visit (INDEPENDENT_AMBULATORY_CARE_PROVIDER_SITE_OTHER): Payer: Medicaid Other | Admitting: Student

## 2023-03-02 DIAGNOSIS — M25562 Pain in left knee: Secondary | ICD-10-CM

## 2023-03-02 MED ORDER — MELOXICAM 15 MG PO TABS
15.0000 mg | ORAL_TABLET | Freq: Every day | ORAL | 0 refills | Status: AC
  Filled 2023-03-02: qty 7, 7d supply, fill #0

## 2023-03-02 NOTE — Telephone Encounter (Signed)
Spoke with patient . Verified name & DOB. Advised pt to bring in the Handicap form that can be obtain at the [facility] and it would for given to Dr.Newlin for her approval. Patient was seen at [facility] ortho on yesterday. Patient voiced that her left kneed still hurts and she is being schedule for MRI

## 2023-03-02 NOTE — Progress Notes (Signed)
Chief Complaint: Left knee pain     History of Present Illness:    Rachel [name] is a 37 y.o. female with history of Ehlers-Danlos presenting for evaluation of acute left knee pain.  Her pain first began on April 6 when she was walking on uneven ground and felt a pop in her knee.  She was then unable to bear weight on it and began locking and giving out.  This was reinjured yesterday when she was bending down to pick something up and again felt a pop.  She has again been unable to bear weight and has increased pain with flexion and extension.  She has also noticed more popping and locking of her knee.  Her pain levels are 9/10 today.  She has tried elevation, alternating ice and heat, and naproxen and Tylenol.  She overall reports little relief with these.   Surgical History:   None of left leg/knee  PMH/PSH/Family History/Social History/Meds/Allergies:    Past Medical History:  Diagnosis Date   ADHD (attention deficit hyperactivity disorder)    Anemia    Anxiety    Asthma    Bipolar disorder    Complication of anesthesia    slow to wake up    Delayed gastric emptying 02/23/2020   Depression    DUB (dysfunctional uterine bleeding) 02/23/2020   Eczema    Ehlers-Danlos syndrome    Endometriosis    Gastroparesis    GERD (gastroesophageal reflux disease)    Heart murmur    Hiatal hernia 04/06/2020   Myalgia 04/06/2020   Nausea & vomiting 09/22/2022   Parasitic infection 02/23/2020   Pneumonia    Poor appetite 02/23/2020   Recurrent upper respiratory infection (URI)    RUQ pain 02/23/2020   Vitamin D deficiency    Weight loss 02/23/2020   Past Surgical History:  Procedure Laterality Date   barium study     COLONOSCOPY     CYSTOSCOPY N/A 07/12/2021   Procedure: CYSTOSCOPY;  Surgeon: Warden Fillers, MD;  Location: [facility] OR;  Service: Gynecology;  Laterality: N/A;   ESOPHAGOGASTRODUODENOSCOPY  2011   and a ph study as well.     LAPAROSCOPIC GASTRIC RESECTION N/A 01/22/2023   Procedure: LAPAROSCOPIC GASTRIC PEXY;  Surgeon: Quentin Ore, MD;  Location: [facility] ORS;  Service: General;  Laterality: N/A;   LAPAROSCOPY N/A 01/26/2021   Procedure: LAPAROSCOPY DIAGNOSTIC;  Surgeon: Warden Fillers, MD;  Location: [facility];  Service: Gynecology;  Laterality: N/A;   NASAL SEPTUM SURGERY     SINOSCOPY     TONGUE FLAP     TOTAL LAPAROSCOPIC HYSTERECTOMY WITH SALPINGECTOMY Bilateral 07/12/2021   Procedure: TOTAL LAPAROSCOPIC HYSTERECTOMY WITH SALPINGECTOMY, OOPHORECTOMY;  Surgeon: Warden Fillers, MD;  Location: [facility] OR;  Service: Gynecology;  Laterality: Bilateral;   UPPER GASTROINTESTINAL ENDOSCOPY     WISDOM TOOTH EXTRACTION     Social History   Socioeconomic History   Marital status: Divorced    Spouse name: Not on file   Number of children: Not on file   Years of education: Not on file   Highest education level: Not on file  Occupational History   Not on file  Tobacco Use   Smoking status: Never    Passive exposure: Never   Smokeless tobacco: Never  Vaping Use  Vaping Use: Never used  Substance and Sexual Activity   Alcohol use: Yes    Alcohol/week: 1.0 standard drink of alcohol    Types: 1 Glasses of wine per week    Comment: 1 glass of wine every other week   Drug use: No   Sexual activity: Not Currently  Other Topics Concern   Not on file  Social History Narrative   Not on file   Social Determinants of Health   Financial Resource Strain: Not on file  Food Insecurity: No Food Insecurity (01/22/2023)   Hunger Vital Sign    Worried About Running Out of Food in the Last Year: Never true    Ran Out of Food in the Last Year: Never true  Transportation Needs: No Transportation Needs (01/22/2023)   PRAPARE - Administrator, Civil Service (Medical): No    Lack of Transportation (Non-Medical): No  Physical Activity: Not on file  Stress: Not on file  Social Connections:  Not on file   Family History  Problem Relation Age of Onset   Eczema Mother    Urticaria Mother    Allergic rhinitis Mother    Colon polyps Mother    Depression Mother    Anxiety disorder Mother    Hypertension Mother    Hyperlipidemia Mother    Asthma Mother    Endometriosis Mother    Fibroids Mother    Fibromyalgia Mother    Migraines Mother    Irritable bowel syndrome Mother    Allergic rhinitis Father    Depression Father    Hypertension Father    Hyperlipidemia Father    Bipolar disorder Father    Urticaria Sister    Allergic rhinitis Sister    Bipolar disorder Sister    ADD / ADHD Sister    Asthma Sister    Endometriosis Sister    Seizures Sister    Bipolar disorder Sister    Anxiety disorder Sister    Heart disease Maternal Grandmother    Endometriosis Maternal Grandmother    Fibroids Maternal Grandmother    Hyperlipidemia Maternal Grandmother    Hypertension Maternal Grandmother    Diabetes Maternal Grandmother    Fibromyalgia Maternal Grandmother    Heart disease Maternal Grandfather    Diabetes Maternal Grandfather    Heart disease Paternal Grandmother    Heart disease Paternal Grandfather    Colon cancer Maternal Great-grandmother    Pancreatic cancer Neg Hx    Esophageal cancer Neg Hx    Stomach cancer Neg Hx    Allergies  Allergen Reactions   Amitriptyline Anaphylaxis   Amphetamine-Dextroamphetamine Other (See Comments)    Triggered bipolar, caused depression, and suicidal thoughts   Lupron [Leuprolide] Anaphylaxis   Orilissa [Elagolix] Anaphylaxis   Penicillins Anaphylaxis, Swelling and Other (See Comments)    Potential for anaphylaxis confirmed. Tolerates Cefphalosporins No "-cillins"!!!! Did it involve swelling of the face/tongue/throat, SOB, or low BP? Yes Did it involve sudden or severe rash/hives, skin peeling, or any reaction on the inside of your mouth or nose? Unknown Did you need to seek medical attention at a hospital or doctor's  office? Unknown When did it last happen? teenager      If all above answers are "NO", may proceed with cephalosporin use.   Wound Dressing Adhesive Rash   Betadine [Povidone-Iodine] Swelling and Rash   Chlorhexidine Hives    DuraPrep    Doxycycline Itching, Nausea And Vomiting and Other (See Comments)    Caused high fever, also  Mixed Ragweed Other (See Comments)    Unknown, Mother and pt state she can't have    Olanzapine Other (See Comments)    Caused mania   Other Other (See Comments)    Stimulants, mood stabilizers, and antidepressants = Triggered bipolar, caused depression, and suicidal thoughts   Prozac [Fluoxetine Hcl] Other (See Comments)    Triggered bipolar, caused depression, and suicidal thoughts   Stevia Glycerite Extract [Flavoring Agent] Other (See Comments)    Unknown reaction; mother and pt confirm any Artificial Sweeteners cause reaction   Tape Other (See Comments)    Paper tape is tolerated   Triple Antibiotic Pain Relief [Neomy-Bacit-Polymyx-Pramoxine] Hives   Reglan [Metoclopramide] Anxiety and Other (See Comments)    Triggered bipolar, caused depression, suicidal thoughts, muscle twitching, stiffness, and high anxiety    Current Outpatient Medications  Medication Sig Dispense Refill   meloxicam (MOBIC) 15 MG tablet Take 1 tablet (15 mg total) by mouth daily for 7 days. 7 tablet 0   acetaminophen (TYLENOL) 500 MG tablet Take 500-1,000 mg by mouth every 6 (six) hours as needed for moderate pain or mild pain.     albuterol (PROVENTIL) (2.5 MG/3ML) 0.083% nebulizer solution Take 3 mLs (2.5 mg total) by nebulization every 6 (six) hours as needed for wheezing or shortness of breath. Max 4 doses per day 75 mL 0   albuterol (VENTOLIN HFA) 108 (90 Base) MCG/ACT inhaler Inhale 1-2 puffs into the lungs every 6 (six) hours as needed for wheezing or shortness of breath. 8 g 1   atorvastatin (LIPITOR) 20 MG tablet TAKE 1 TABLET BY MOUTH DAILY 90 tablet 1   B Complex Vitamins  (B COMPLEX PO) Take 1 capsule by mouth every evening.     Calcium-Magnesium-Vitamin D 600-40-500 MG-MG-UNIT TB24 Take 1 tablet by mouth every evening.     cromolyn (OPTICROM) 4 % ophthalmic solution Place 1 drop into both eyes 4 (four) times daily as needed (itchy/watery eyes). 10 mL 3   diphenhydrAMINE (BENADRYL) 25 MG tablet Take 25 mg by mouth every 6 (six) hours as needed for allergies or itching.     EPINEPHrine 0.3 mg/0.3 mL IJ SOAJ injection Inject 0.3 mg into the muscle as needed for anaphylaxis. 2 each 1   estrogens, conjugated, (PREMARIN) 0.625 MG tablet Take 1 tablet (0.625 mg total) by mouth daily. Take daily for 21 days then do not take for 7 days. (Patient taking differently: Take 0.625 mg by mouth every evening. Take daily for 21 days then do not take for 7 days.) 30 tablet 11   ferrous sulfate 325 (65 FE) MG tablet TAKE 1 TABLET BY MOUTH DAILY 60 tablet 2   fexofenadine (ALLEGRA) 180 MG tablet Take 180 mg by mouth every evening.     fluticasone (FLONASE) 50 MCG/ACT nasal spray Place 1 spray into both nostrils 2 (two) times daily as needed (nasal congestion). (Patient not taking: Reported on 01/15/2023) 16 g 3   ibuprofen (ADVIL) 200 MG tablet Take 400-800 mg by mouth every 8 (eight) hours as needed (pain.).     ketorolac (ACULAR) 0.5 % ophthalmic solution Place 1 drop into the right eye 4 (four) times daily. 3 mL 0   LINZESS 290 MCG CAPS capsule TAKE 1 CAPSULE BY MOUTH DAILY BEFORE BREAKFAST 30 capsule 0   Melatonin 10 MG TABS Take 10 mg by mouth at bedtime.     methocarbamol (ROBAXIN-750) 750 MG tablet Take 1 tablet (750 mg total) by mouth 4 (four) times daily. 30  tablet 0   MIRALAX 17 GM/SCOOP powder Take 17-34 g by mouth daily. Mix 17 g of powder into water and drink once a day     Olopatadine HCl (PATADAY) 0.2 % SOLN Place 1-2 drops into both eyes 3 (three) times daily as needed (allergies eye).     Omega-3 Fatty Acids (FISH OIL) 1200 MG CAPS Take 1,200 mg by mouth daily.      oxyCODONE-acetaminophen (PERCOCET) 5-325 MG tablet Take 1 tablet by mouth every 4 (four) hours as needed for severe pain. 20 tablet 0   promethazine (PHENERGAN) 12.5 MG tablet Take 1 tablet (12.5 mg total) by mouth every 8 (eight) hours as needed for nausea. 30 tablet 1   rizatriptan (MAXALT-MLT) 10 MG disintegrating tablet May repeat in 2 hours if needed 10 tablet 5   sennosides-docusate sodium (SENOKOT-S) 8.6-50 MG tablet Take 2-3 tablets by mouth daily as needed for constipation.     sulfacetamide (BLEPH-10) 10 % ophthalmic solution Place 1-2 drops into the right eye every 3 (three) hours. Use while awake (Patient taking differently: Place 1-2 drops into the right eye every 3 (three) hours as needed (eye irritation.). Use while awake) 5 mL 0   triamcinolone cream (KENALOG) 0.1 % Apply 1 Application topically 2 (two) times daily as needed (rash, itching). 45 g 1   VITAMIN A PO Take 3,000 mcg by mouth every evening.     zinc sulfate 220 (50 Zn) MG capsule Take 220 mg by mouth every evening.     No current facility-administered medications for this visit.   No results found.  Review of Systems:   A ROS was performed including pertinent positives and negatives as documented in the HPI.  Physical Exam :   Constitutional: NAD and appears stated age Neurological: Alert and oriented Psych: Appropriate affect and cooperative There were no vitals taken for this visit.   Comprehensive Musculoskeletal Exam:    No obvious deformity or erythema of the left knee.  Tenderness to palpation particularly on the medial aspect of the knee.  Patella is mobile and nontender.  Passive range of motion 0 to 90 degrees but unable to evaluate active range of motion due to pain.  No laxity appreciated with varus or valgus stress.  Positive McMurray's.  Negative Lachman.  Distal neurosensory exam intact.  Imaging:   Xray (Left knee 4 views): Negative for fracture or dislocation   I personally reviewed and  interpreted the radiographs.   Assessment:   37 y.o. female with acute left knee pain.  She has had multiple injuries recently that have caused her to not be able to bear weight and have increased mechanical symptoms.  At this point would like to go ahead and order an MRI to evaluate for a meniscal or ligamentous injury.  In the meantime we will provide crutches as well as a soft hinged knee brace for added stability.  I will also send in a weeks worth of meloxicam to take with the Tylenol, however due to her reported lack of effectiveness with pain medication I recommended that if she does not notice any improvement within a few days she can stop and switch over to a topical NSAID.  We will plan to see her back in 3 to 4 weeks for MRI follow-up.  Plan :    -Return to clinic for MRI follow-up     I personally saw and evaluated the patient, and participated in the management and treatment plan.  Hazle Nordmann, PA-C Orthopedics  This document was dictated using Systems analyst. A reasonable attempt at proof reading has been made to minimize errors.

## 2023-03-05 NOTE — BH Specialist Note (Addendum)
Integrated Behavioral Health via Telemedicine Visit  03/15/2023 Rachel [name] [id number]  Number of Integrated Behavioral Health Clinician visits: Additional Visit  Session Start time: 1047   Session End time: 1202  Total time in minutes: 75   Referring Provider: Mariel Aloe, MD Patient/Family location: Home Rachel [name] Provider location: [facility]  All persons participating in visit: [name] and [facility] Rachel [name]   Types of Service: Individual psychotherapy and Telephone visit  I connected with Rachel [name] [name] Mand's  n/a  via  Telephone or Video Enabled Telemedicine Application  (Video is Caregility application) and verified that I am speaking with the correct person using two identifiers. Discussed confidentiality: Yes   I discussed the limitations of telemedicine and the availability of in person appointments.  Discussed there is a possibility of technology failure and discussed alternative modes of communication if that failure occurs.  I discussed that engaging in this telemedicine visit, they consent to the provision of behavioral healthcare and the services will be billed under their insurance.  Patient and/or legal guardian expressed understanding and consented to Telemedicine visit: Yes   Presenting Concerns: Patient and/or family reports the following symptoms/concerns: n/a Duration of problem: Ongoing; Severity of problem: moderate  Patient and/or Family's Strengths/Protective Factors: Social connections, Concrete supports in place (healthy food, safe environments, etc.), and Sense of purpose  Goals Addressed: Patient will:  Reduce symptoms of: anxiety, depression, and stress    Demonstrate ability to:  Have healthy conversations/setting healthy boundaries  Progress towards Goals: Ongoing  Interventions: Interventions utilized:   Manufacturing systems engineer and Supportive Reflection Standardized Assessments completed: Not Needed  Patient and/or Family Response: Patient agrees with treatment plan.   Assessment: Patient currently experiencing PTSD, ADHD; Psychosocial stress.   Patient may benefit from continued therapeutic interventions.  Plan: Follow up with behavioral health clinician on : Two weeks Behavioral recommendations:  -Continue daily self-coping strategies, including setting healthy boundaries and healthy communication, as discussed -Continue setting storage items to fund future plans -Consider using additional resources on After Visit Summary, as discussed Referral(s): Integrated [employer] (In Clinic)  I discussed the assessment and treatment plan with the patient and/or parent/guardian. They were provided an opportunity to ask questions and all were answered. They agreed with the plan and demonstrated an understanding of the instructions.   They were advised to call back or seek an in-person evaluation if the symptoms worsen or if the condition fails to improve as anticipated.  Rachel [name] Rachel Mcgrail, LCSW     11/02/2022    9:23 AM 08/02/2022    2:20 PM 08/02/2022    2:11 PM 05/02/2022    2:26 PM 03/06/2022    9:40 AM  Depression screen PHQ 2/9  Decreased Interest 0 0 0 0 0  Down, Depressed, Hopeless 1 0 0 0 0  PHQ - 2 Score 1 0 0 0 0  Altered sleeping 1 2  2    Tired, decreased energy 1 2  1    Change in appetite 1 1  2    Feeling bad or failure about yourself  0 0  0   Trouble concentrating 0 1  1   Moving slowly or fidgety/restless 0 0  0   Suicidal thoughts 0 0  0   PHQ-9 Score 4 6  6        11/02/2022    9:24 AM 08/02/2022    2:20 PM 05/02/2022    2:26 PM  01/30/2022    9:56 AM  GAD 7 : Generalized Anxiety Score  Nervous, Anxious, on Edge 1 0 0 0  Control/stop worrying 0 0 0 0  Worry too much - different things 0 0 0 0  Trouble relaxing 1 1 2 1   Restless 0 0 0 0  Easily  annoyed or irritable 0 0 1 0  Afraid - awful might happen 0 0 0 0  Total GAD 7 Score 2 1 3  1

## 2023-03-06 NOTE — Progress Notes (Deleted)
Follow Up Note  RE: Rachel [name] MRN: [id number] DOB: November 07, 1986 Date of Office Visit: 03/07/2023  Referring provider: Hoy Register, MD Primary care provider: Hoy Register, MD  Chief Complaint: No chief complaint on file.  History of Present Illness: I had the pleasure of seeing Rachel [name] for a follow up visit at the [facility] on 03/06/2023. She is a 37 y.o. female, who is being followed for allergic rhinoconjunctivitis on AIT, rash, allergic reaction, multiple drug allergies, recurrent infections, asthma, adverse food reaction. Her previous allergy office visit was on 11/23/2022 with Dr. Selena Batten. Today is a regular follow up visit.  01/04/2023 M-C-D   I reviewed the bloodwork. Blood count, kidney function, liver function, electrolytes, thyroid, autoimmune screener, inflammation markers, chronic urticaria index (checks for autoantibodies that trigger mast cells), tryptase (checks for mast cell issues) and alpha gal (checks for red meat allergy) were all normal which is great.   Environmental panel positive to cat and dog. Does she still want to do the allergy shots? Then I will put in order to mix.   I wonder if the itchy rash may be flared by the above allergens. Latex was negative. Continue to avoid though due to your past clinical history.   Your immunoglobulin levels were normal You also have good protection against diptheria and tetanus. Your pneumococcal titers were adequate. Sometimes people with low titers are more likely to develop respiratory infections caused by the bacteria strep pneumoniae. No immunodeficiency based on these results. Keep track of infections and antibiotics use.  Other allergic rhinitis Conjunctivitis symptoms which flares in the fall and winter.  Skin testing over 10 years ago showed multiple positives per patient report.  No prior AIT.  Sinus surgery in the past.  2 dogs and 2 cats at home. Today's skin prick  testing showed: Positive to mold, cat and dog only. Get bloodwork as positive control was only borderline positive.  Start environmental control measures as below. Use over the counter antihistamines such as Zyrtec (cetirizine), Claritin (loratadine), Allegra (fexofenadine), or Xyzal (levocetirizine) daily as needed. May take twice a day during allergy flares. May switch antihistamines every few months. Use Flonase (fluticasone) nasal spray 1 spray per nostril twice a day as needed for nasal congestion.  Nasal saline spray (i.e., Simply Saline) or nasal saline lavage (i.e., NeilMed) is recommended as needed and prior to medicated nasal sprays. Use cromolyn 4% 1 drop in each eye up to four times a day as needed for itchy/watery eyes.  Start allergy injections. Will make the vials after the bloodwork is back.  Had a detailed discussion with patient/family that clinical history is suggestive of allergic rhinitis, and may benefit from allergy immunotherapy (AIT). Discussed in detail regarding the dosing, schedule, side effects (mild to moderate local allergic reaction and rarely systemic allergic reactions including anaphylaxis), and benefits (significant improvement in nasal symptoms, seasonal flares of asthma) of immunotherapy with the patient. There is significant time commitment involved with allergy shots, which includes weekly immunotherapy injections for first 9-12 months and then biweekly to monthly injections for 3-5 years.  I have prescribed epinephrine injectable and demonstrated proper use. For mild symptoms you can take over the counter antihistamines such as Benadryl and monitor symptoms closely. If symptoms worsen or if you have severe symptoms including breathing issues, throat closure, significant swelling, whole body hives, severe diarrhea and vomiting, lightheadedness then inject epinephrine and seek immediate medical care afterwards. Emergency action plan given.   Allergic  conjunctivitis  of both eyes See assessment and plan as above.   Rash and other nonspecific skin eruption Breaking out in random itchy rashes since 2015. No specific triggers noted but has a flare every 1-2 months. Symptoms seems to lasts for a few weeks at a time. History of eczema. No prior dermatology evaluation. Discussed with patient that I'm not sure how much of the above allergens are contributing to her skin issues.  See below for proper skin care. Keep track of outbreaks and take pictures. May need to see dermatology next.  Continue with allegra 180mg  once a day - may take twice a day during flares. If symptoms are not controlled or causes drowsiness let us know. Avoid the following potential triggers: alcohol, tight clothing, NSAIDs, hot showers and getting overheated. Get bloodwork to rule out other etiologies.    Allergic reaction Patient had multiple reaction to various medications but then has milder allergic reactions with no known triggers. Keep track of symptoms and episodes.  Get bloodwork. For mild symptoms you can take over the counter antihistamines such as Benadryl and monitor symptoms closely. If symptoms worsen or if you have severe symptoms including breathing issues, throat closure, significant swelling, whole body hives, severe diarrhea and vomiting, lightheadedness then inject epinephrine and seek immediate medical care afterwards.   Multiple drug allergies Reactions to multiple drugs. Dewayne Hatch required ER visit with IM epi. Penicillin reaction as a young child in the form of rash/hives. Avoid drugs on allergy list. Recommend penicillin skin testing first.  If interested we can schedule drug challenge to antibiotics - one at a time. You must be off antihistamines for 3-5 days before. Must be in good health and not ill. No vaccines/injections/antibiotics within the past 7 days. Plan on being in the office for 2-3 hours and must bring in the drug you want to do the oral challenge  for - will send in prescription to pick up a few days before. You must call to schedule an appointment and specify it's for a drug challenge.    Recurrent infections Sepsis x 2,  pneumonia, ear infections. Usually needs 2-4 courses of antibiotics per year. Keep track of infections and antibiotics use. Get bloodwork to look at immune system.    Mild intermittent asthma without complication Usually flares with cold weather and infection. Tried Singulair and Advair in the past.  Today's spirometry was normal. May use albuterol rescue inhaler 2 puffs or nebulizer every 4 to 6 hours as needed for shortness of breath, chest tightness, coughing, and wheezing.  Monitor frequency of use.    Other adverse food reactions, not elsewhere classified, subsequent encounter Pork causes perioral sores and tingling. Dairy flares her GI symptoms. Today's skin testing negative to select foods. Avoid pork and dairy. She may have lactose intolerance.    Return in about 3 months (around 03/05/2023).  Assessment and Plan: Tarnisha is a 37 y.o. female with: No problem-specific Assessment & Plan notes found for this encounter.  No follow-ups on file.  No orders of the defined types were placed in this encounter.  Lab Orders  No laboratory test(s) ordered today    Diagnostics: Spirometry:  Tracings reviewed. Her effort: {Blank single:19197::"Good reproducible efforts.","It was hard to get consistent efforts and there is a question as to whether this reflects a maximal maneuver.","Poor effort, data can not be interpreted."} FVC: ***L FEV1: ***L, ***% predicted FEV1/FVC ratio: ***% Interpretation: {Blank single:19197::"Spirometry consistent with mild obstructive disease","Spirometry consistent with moderate obstructive disease","Spirometry consistent with  severe obstructive disease","Spirometry consistent with possible restrictive disease","Spirometry consistent with mixed obstructive and restrictive  disease","Spirometry uninterpretable due to technique","Spirometry consistent with normal pattern","No overt abnormalities noted given today's efforts"}.  Please see scanned spirometry results for details.  Skin Testing: {Blank single:19197::"Select foods","Environmental allergy panel","Environmental allergy panel and select foods","Food allergy panel","None","Deferred due to recent antihistamines use"}. *** Results discussed with patient/family.   Medication List:  Current Outpatient Medications  Medication Sig Dispense Refill   acetaminophen (TYLENOL) 500 MG tablet Take 500-1,000 mg by mouth every 6 (six) hours as needed for moderate pain or mild pain.     albuterol (PROVENTIL) (2.5 MG/3ML) 0.083% nebulizer solution Take 3 mLs (2.5 mg total) by nebulization every 6 (six) hours as needed for wheezing or shortness of breath. Max 4 doses per day 75 mL 0   albuterol (VENTOLIN HFA) 108 (90 Base) MCG/ACT inhaler Inhale 1-2 puffs into the lungs every 6 (six) hours as needed for wheezing or shortness of breath. 8 g 1   atorvastatin (LIPITOR) 20 MG tablet TAKE 1 TABLET BY MOUTH DAILY 90 tablet 1   B Complex Vitamins (B COMPLEX PO) Take 1 capsule by mouth every evening.     Calcium-Magnesium-Vitamin D 600-40-500 MG-MG-UNIT TB24 Take 1 tablet by mouth every evening.     cromolyn (OPTICROM) 4 % ophthalmic solution Place 1 drop into both eyes 4 (four) times daily as needed (itchy/watery eyes). 10 mL 3   diphenhydrAMINE (BENADRYL) 25 MG tablet Take 25 mg by mouth every 6 (six) hours as needed for allergies or itching.     EPINEPHrine 0.3 mg/0.3 mL IJ SOAJ injection Inject 0.3 mg into the muscle as needed for anaphylaxis. 2 each 1   estrogens, conjugated, (PREMARIN) 0.625 MG tablet Take 1 tablet (0.625 mg total) by mouth daily. Take daily for 21 days then do not take for 7 days. (Patient taking differently: Take 0.625 mg by mouth every evening. Take daily for 21 days then do not take for 7 days.) 30 tablet 11    ferrous sulfate 325 (65 FE) MG tablet TAKE 1 TABLET BY MOUTH DAILY 60 tablet 2   fexofenadine (ALLEGRA) 180 MG tablet Take 180 mg by mouth every evening.     fluticasone (FLONASE) 50 MCG/ACT nasal spray Place 1 spray into both nostrils 2 (two) times daily as needed (nasal congestion). (Patient not taking: Reported on 01/15/2023) 16 g 3   ibuprofen (ADVIL) 200 MG tablet Take 400-800 mg by mouth every 8 (eight) hours as needed (pain.).     ketorolac (ACULAR) 0.5 % ophthalmic solution Place 1 drop into the right eye 4 (four) times daily. 3 mL 0   LINZESS 290 MCG CAPS capsule TAKE 1 CAPSULE BY MOUTH DAILY BEFORE BREAKFAST 30 capsule 0   Melatonin 10 MG TABS Take 10 mg by mouth at bedtime.     meloxicam (MOBIC) 15 MG tablet Take 1 tablet (15 mg total) by mouth daily for 7 days. 7 tablet 0   methocarbamol (ROBAXIN-750) 750 MG tablet Take 1 tablet (750 mg total) by mouth 4 (four) times daily. 30 tablet 0   MIRALAX 17 GM/SCOOP powder Take 17-34 g by mouth daily. Mix 17 g of powder into water and drink once a day     Olopatadine HCl (PATADAY) 0.2 % SOLN Place 1-2 drops into both eyes 3 (three) times daily as needed (allergies eye).     Omega-3 Fatty Acids (FISH OIL) 1200 MG CAPS Take 1,200 mg by mouth daily.  oxyCODONE-acetaminophen (PERCOCET) 5-325 MG tablet Take 1 tablet by mouth every 4 (four) hours as needed for severe pain. 20 tablet 0   promethazine (PHENERGAN) 12.5 MG tablet Take 1 tablet (12.5 mg total) by mouth every 8 (eight) hours as needed for nausea. 30 tablet 1   rizatriptan (MAXALT-MLT) 10 MG disintegrating tablet May repeat in 2 hours if needed 10 tablet 5   sennosides-docusate sodium (SENOKOT-S) 8.6-50 MG tablet Take 2-3 tablets by mouth daily as needed for constipation.     sulfacetamide (BLEPH-10) 10 % ophthalmic solution Place 1-2 drops into the right eye every 3 (three) hours. Use while awake (Patient taking differently: Place 1-2 drops into the right eye every 3 (three) hours as  needed (eye irritation.). Use while awake) 5 mL 0   triamcinolone cream (KENALOG) 0.1 % Apply 1 Application topically 2 (two) times daily as needed (rash, itching). 45 g 1   VITAMIN A PO Take 3,000 mcg by mouth every evening.     zinc sulfate 220 (50 Zn) MG capsule Take 220 mg by mouth every evening.     No current facility-administered medications for this visit.   Allergies: Allergies  Allergen Reactions   Amitriptyline Anaphylaxis   Amphetamine-Dextroamphetamine Other (See Comments)    Triggered bipolar, caused depression, and suicidal thoughts   Lupron [Leuprolide] Anaphylaxis   Orilissa [Elagolix] Anaphylaxis   Penicillins Anaphylaxis, Swelling and Other (See Comments)    Potential for anaphylaxis confirmed. Tolerates Cefphalosporins No "-cillins"!!!! Did it involve swelling of the face/tongue/throat, SOB, or low BP? Yes Did it involve sudden or severe rash/hives, skin peeling, or any reaction on the inside of your mouth or nose? Unknown Did you need to seek medical attention at a hospital or doctor's office? Unknown When did it last happen? teenager      If all above answers are "NO", may proceed with cephalosporin use.   Wound Dressing Adhesive Rash   Betadine [Povidone-Iodine] Swelling and Rash   Chlorhexidine Hives    DuraPrep    Doxycycline Itching, Nausea And Vomiting and Other (See Comments)    Caused high fever, also   Mixed Ragweed Other (See Comments)    Unknown, Mother and pt state she can't have    Olanzapine Other (See Comments)    Caused mania   Other Other (See Comments)    Stimulants, mood stabilizers, and antidepressants = Triggered bipolar, caused depression, and suicidal thoughts   Prozac [Fluoxetine Hcl] Other (See Comments)    Triggered bipolar, caused depression, and suicidal thoughts   Stevia Glycerite Extract [Flavoring Agent] Other (See Comments)    Unknown reaction; mother and pt confirm any Artificial Sweeteners cause reaction   Tape Other (See  Comments)    Paper tape is tolerated   Triple Antibiotic Pain Relief [Neomy-Bacit-Polymyx-Pramoxine] Hives   Reglan [Metoclopramide] Anxiety and Other (See Comments)    Triggered bipolar, caused depression, suicidal thoughts, muscle twitching, stiffness, and high anxiety    I reviewed her past medical history, social history, family history, and environmental history and no significant changes have been reported from her previous visit.  Review of Systems  Constitutional:  Positive for chills and fever. Negative for appetite change and unexpected weight change.  HENT:  Negative for congestion and rhinorrhea.   Eyes:  Negative for itching.  Respiratory:  Negative for cough, chest tightness, shortness of breath and wheezing.   Cardiovascular:  Negative for chest pain.  Gastrointestinal:  Positive for abdominal pain.  Genitourinary:  Negative for difficulty urinating.  Skin:  Positive for rash.  Allergic/Immunologic: Positive for environmental allergies.  Neurological:  Positive for headaches.    Objective: LMP  (LMP Unknown)  There is no height or weight on file to calculate BMI. Physical Exam Vitals and nursing note reviewed.  Constitutional:      Appearance: Normal appearance. She is well-developed.  HENT:     Head: Normocephalic and atraumatic.     Right Ear: Tympanic membrane and external ear normal.     Left Ear: Tympanic membrane and external ear normal.     Nose: Nose normal.     Mouth/Throat:     Mouth: Mucous membranes are moist.     Pharynx: Oropharynx is clear.  Eyes:     Conjunctiva/sclera: Conjunctivae normal.  Cardiovascular:     Rate and Rhythm: Normal rate and regular rhythm.     Heart sounds: Normal heart sounds. No murmur heard.    No friction rub. No gallop.  Pulmonary:     Effort: Pulmonary effort is normal.     Breath sounds: Normal breath sounds. No wheezing, rhonchi or rales.  Musculoskeletal:     Cervical back: Neck supple.  Skin:    General: Skin  is warm.     Findings: No rash.  Neurological:     Mental Status: She is alert and oriented to person, place, and time.  Psychiatric:        Behavior: Behavior normal.    Previous notes and tests were reviewed. The plan was reviewed with the patient/family, and all questions/concerned were addressed.  It was my pleasure to see Clarabel today and participate in her care. Please feel free to contact me with any questions or concerns.  Sincerely,  Wyline Mood, DO Allergy & Immunology  [facility] office: [phone] [facility] office: [phone]

## 2023-03-07 ENCOUNTER — Other Ambulatory Visit (HOSPITAL_COMMUNITY): Payer: Self-pay | Admitting: Surgery

## 2023-03-07 ENCOUNTER — Ambulatory Visit (HOSPITAL_BASED_OUTPATIENT_CLINIC_OR_DEPARTMENT_OTHER)
Admission: RE | Admit: 2023-03-07 | Discharge: 2023-03-07 | Disposition: A | Discharge status: Home / Self Care | Payer: Medicaid Other | Source: Ambulatory Visit | Attending: Surgery | Admitting: Surgery

## 2023-03-07 ENCOUNTER — Encounter (HOSPITAL_COMMUNITY): Payer: Self-pay | Admitting: Surgery

## 2023-03-07 ENCOUNTER — Ambulatory Visit: Payer: Medicaid Other | Admitting: Allergy

## 2023-03-07 DIAGNOSIS — T781XXD Other adverse food reactions, not elsewhere classified, subsequent encounter: Secondary | ICD-10-CM

## 2023-03-07 DIAGNOSIS — R21 Rash and other nonspecific skin eruption: Secondary | ICD-10-CM

## 2023-03-07 DIAGNOSIS — B999 Unspecified infectious disease: Secondary | ICD-10-CM

## 2023-03-07 DIAGNOSIS — L2089 Other atopic dermatitis: Secondary | ICD-10-CM

## 2023-03-07 DIAGNOSIS — J3089 Other allergic rhinitis: Secondary | ICD-10-CM

## 2023-03-07 DIAGNOSIS — R109 Unspecified abdominal pain: Secondary | ICD-10-CM | POA: Diagnosis not present

## 2023-03-07 DIAGNOSIS — K3189 Other diseases of stomach and duodenum: Secondary | ICD-10-CM

## 2023-03-07 DIAGNOSIS — T7840XD Allergy, unspecified, subsequent encounter: Secondary | ICD-10-CM

## 2023-03-07 DIAGNOSIS — Z889 Allergy status to unspecified drugs, medicaments and biological substances status: Secondary | ICD-10-CM

## 2023-03-07 DIAGNOSIS — J4520 Mild intermittent asthma, uncomplicated: Secondary | ICD-10-CM

## 2023-03-07 MED ORDER — IOHEXOL 300 MG/ML  SOLN
100.0000 mL | Freq: Once | INTRAMUSCULAR | Status: AC | PRN
  Administered 2023-03-07: 75 mL via INTRAVENOUS

## 2023-03-07 NOTE — Progress Notes (Signed)
Anatomy appears normal on CT.  Some gas throughout the intestines likely related to chronic poor GI function.  No clear explanation for her symptoms, but I would expect with time they will pass.

## 2023-03-09 ENCOUNTER — Ambulatory Visit (HOSPITAL_BASED_OUTPATIENT_CLINIC_OR_DEPARTMENT_OTHER)
Admission: RE | Admit: 2023-03-09 | Discharge: 2023-03-09 | Disposition: A | Discharge status: Home / Self Care | Payer: Medicaid Other | Source: Ambulatory Visit | Attending: Student | Admitting: Student

## 2023-03-09 DIAGNOSIS — M25562 Pain in left knee: Secondary | ICD-10-CM | POA: Insufficient documentation

## 2023-03-11 ENCOUNTER — Other Ambulatory Visit: Payer: Self-pay | Admitting: Gastroenterology

## 2023-03-13 ENCOUNTER — Ambulatory Visit (INDEPENDENT_AMBULATORY_CARE_PROVIDER_SITE_OTHER): Payer: Medicaid Other

## 2023-03-13 DIAGNOSIS — J309 Allergic rhinitis, unspecified: Secondary | ICD-10-CM | POA: Diagnosis not present

## 2023-03-13 DIAGNOSIS — R3914 Feeling of incomplete bladder emptying: Secondary | ICD-10-CM | POA: Diagnosis not present

## 2023-03-13 DIAGNOSIS — R3915 Urgency of urination: Secondary | ICD-10-CM | POA: Diagnosis not present

## 2023-03-14 ENCOUNTER — Telehealth: Payer: Self-pay | Admitting: Clinical

## 2023-03-14 ENCOUNTER — Ambulatory Visit (HOSPITAL_BASED_OUTPATIENT_CLINIC_OR_DEPARTMENT_OTHER): Payer: Medicaid Other | Admitting: Student

## 2023-03-14 DIAGNOSIS — M25362 Other instability, left knee: Secondary | ICD-10-CM | POA: Diagnosis not present

## 2023-03-14 NOTE — Telephone Encounter (Signed)
Brief follow-up call requested by pt, due to current life stress; pt agrees to discuss further at schedule appointment tomorrow, 03/15/23.

## 2023-03-14 NOTE — Progress Notes (Signed)
Chief Complaint: Left knee pain     History of Present Illness:   03/14/23: Patient is here today for MRI follow up.  Overall she reports little improvement in her symptoms since last visit.  She reports being able to stand for 5 to 10 minutes at a time which does increase her pain and causes her knee to swell.  She has been using the knee brace as this is been the only thing to help manage her pain.  She has been icing and using Tylenol.   Rachel [name] is a 37 y.o. female with history of Ehlers-Danlos presenting for evaluation of acute left knee pain.  Her pain first began on April 6 when she was walking on uneven ground and felt a pop in her knee.  She was then unable to bear weight on it and began locking and giving out.  This was reinjured yesterday when she was bending down to pick something up and again felt a pop.  She has again been unable to bear weight and has increased pain with flexion and extension.  She has also noticed more popping and locking of her knee.  Her pain levels are 9/10 today.  She has tried elevation, alternating ice and heat, and naproxen and Tylenol.  She overall reports little relief with these.   Surgical History:   None of left leg/knee  PMH/PSH/Family History/Social History/Meds/Allergies:    Past Medical History:  Diagnosis Date   ADHD (attention deficit hyperactivity disorder)    Anemia    Anxiety    Asthma    Bipolar disorder (HCC)    Complication of anesthesia    slow to wake up    Delayed gastric emptying 02/23/2020   Depression    DUB (dysfunctional uterine bleeding) 02/23/2020   Eczema    Ehlers-Danlos syndrome    Endometriosis    Gastroparesis    GERD (gastroesophageal reflux disease)    Heart murmur    Hiatal hernia 04/06/2020   Myalgia 04/06/2020   Nausea & vomiting 09/22/2022   Parasitic infection 02/23/2020   Pneumonia    Poor appetite 02/23/2020   Recurrent upper respiratory  infection (URI)    RUQ pain 02/23/2020   Vitamin D deficiency    Weight loss 02/23/2020   Past Surgical History:  Procedure Laterality Date   barium study     COLONOSCOPY     CYSTOSCOPY N/A 07/12/2021   Procedure: CYSTOSCOPY;  Surgeon: Warden Fillers, MD;  Location: [facility] OR;  Service: Gynecology;  Laterality: N/A;   ESOPHAGOGASTRODUODENOSCOPY  2011   and a ph study as well.    LAPAROSCOPIC GASTRIC RESECTION N/A 01/22/2023   Procedure: LAPAROSCOPIC GASTRIC PEXY;  Surgeon: Quentin Ore, MD;  Location: [facility] ORS;  Service: General;  Laterality: N/A;   LAPAROSCOPY N/A 01/26/2021   Procedure: LAPAROSCOPY DIAGNOSTIC;  Surgeon: Warden Fillers, MD;  Location: [facility];  Service: Gynecology;  Laterality: N/A;   NASAL SEPTUM SURGERY     SINOSCOPY     TONGUE FLAP     TOTAL LAPAROSCOPIC HYSTERECTOMY WITH SALPINGECTOMY Bilateral 07/12/2021   Procedure: TOTAL LAPAROSCOPIC HYSTERECTOMY WITH SALPINGECTOMY, OOPHORECTOMY;  Surgeon: Warden Fillers, MD;  Location: [facility] OR;  Service: Gynecology;  Laterality: Bilateral;   UPPER GASTROINTESTINAL ENDOSCOPY     WISDOM TOOTH EXTRACTION  Social History   Socioeconomic History   Marital status: Divorced    Spouse name: Not on file   Number of children: Not on file   Years of education: Not on file   Highest education level: Not on file  Occupational History   Not on file  Tobacco Use   Smoking status: Never    Passive exposure: Never   Smokeless tobacco: Never  Vaping Use   Vaping Use: Never used  Substance and Sexual Activity   Alcohol use: Yes    Alcohol/week: 1.0 standard drink of alcohol    Types: 1 Glasses of wine per week    Comment: 1 glass of wine every other week   Drug use: No   Sexual activity: Not Currently  Other Topics Concern   Not on file  Social History Narrative   Not on file   Social Determinants of Health   Financial Resource Strain: Not on file  Food Insecurity: No Food Insecurity (01/22/2023)    Hunger Vital Sign    Worried About Running Out of Food in the Last Year: Never true    Ran Out of Food in the Last Year: Never true  Transportation Needs: No Transportation Needs (01/22/2023)   PRAPARE - Administrator, Civil Service (Medical): No    Lack of Transportation (Non-Medical): No  Physical Activity: Not on file  Stress: Not on file  Social Connections: Not on file   Family History  Problem Relation Age of Onset   Eczema Mother    Urticaria Mother    Allergic rhinitis Mother    Colon polyps Mother    Depression Mother    Anxiety disorder Mother    Hypertension Mother    Hyperlipidemia Mother    Asthma Mother    Endometriosis Mother    Fibroids Mother    Fibromyalgia Mother    Migraines Mother    Irritable bowel syndrome Mother    Allergic rhinitis Father    Depression Father    Hypertension Father    Hyperlipidemia Father    Bipolar disorder Father    Urticaria Sister    Allergic rhinitis Sister    Bipolar disorder Sister    ADD / ADHD Sister    Asthma Sister    Endometriosis Sister    Seizures Sister    Bipolar disorder Sister    Anxiety disorder Sister    Heart disease Maternal Grandmother    Endometriosis Maternal Grandmother    Fibroids Maternal Grandmother    Hyperlipidemia Maternal Grandmother    Hypertension Maternal Grandmother    Diabetes Maternal Grandmother    Fibromyalgia Maternal Grandmother    Heart disease Maternal Grandfather    Diabetes Maternal Grandfather    Heart disease Paternal Grandmother    Heart disease Paternal Grandfather    Colon cancer Maternal Great-grandmother    Pancreatic cancer Neg Hx    Esophageal cancer Neg Hx    Stomach cancer Neg Hx    Allergies  Allergen Reactions   Amitriptyline Anaphylaxis   Amphetamine-Dextroamphetamine Other (See Comments)    Triggered bipolar, caused depression, and suicidal thoughts   Lupron [Leuprolide] Anaphylaxis   Orilissa [Elagolix] Anaphylaxis   Penicillins  Anaphylaxis, Swelling and Other (See Comments)    Potential for anaphylaxis confirmed. Tolerates Cefphalosporins No "-cillins"!!!! Did it involve swelling of the face/tongue/throat, SOB, or low BP? Yes Did it involve sudden or severe rash/hives, skin peeling, or any reaction on the inside of your mouth or nose? Unknown Did  you need to seek medical attention at a hospital or doctor's office? Unknown When did it last happen? teenager      If all above answers are "NO", may proceed with cephalosporin use.   Wound Dressing Adhesive Rash   Betadine [Povidone-Iodine] Swelling and Rash   Chlorhexidine Hives    DuraPrep    Doxycycline Itching, Nausea And Vomiting and Other (See Comments)    Caused high fever, also   Mixed Ragweed Other (See Comments)    Unknown, Mother and pt state she can't have    Olanzapine Other (See Comments)    Caused mania   Other Other (See Comments)    Stimulants, mood stabilizers, and antidepressants = Triggered bipolar, caused depression, and suicidal thoughts   Prozac [Fluoxetine Hcl] Other (See Comments)    Triggered bipolar, caused depression, and suicidal thoughts   Stevia Glycerite Extract [Flavoring Agent] Other (See Comments)    Unknown reaction; mother and pt confirm any Artificial Sweeteners cause reaction   Tape Other (See Comments)    Paper tape is tolerated   Triple Antibiotic Pain Relief [Neomy-Bacit-Polymyx-Pramoxine] Hives   Reglan [Metoclopramide] Anxiety and Other (See Comments)    Triggered bipolar, caused depression, suicidal thoughts, muscle twitching, stiffness, and high anxiety    Current Outpatient Medications  Medication Sig Dispense Refill   acetaminophen (TYLENOL) 500 MG tablet Take 500-1,000 mg by mouth every 6 (six) hours as needed for moderate pain or mild pain.     albuterol (PROVENTIL) (2.5 MG/3ML) 0.083% nebulizer solution Take 3 mLs (2.5 mg total) by nebulization every 6 (six) hours as needed for wheezing or shortness of breath.  Max 4 doses per day 75 mL 0   albuterol (VENTOLIN HFA) 108 (90 Base) MCG/ACT inhaler Inhale 1-2 puffs into the lungs every 6 (six) hours as needed for wheezing or shortness of breath. 8 g 1   atorvastatin (LIPITOR) 20 MG tablet TAKE 1 TABLET BY MOUTH DAILY 90 tablet 1   B Complex Vitamins (B COMPLEX PO) Take 1 capsule by mouth every evening.     Calcium-Magnesium-Vitamin D 600-40-500 MG-MG-UNIT TB24 Take 1 tablet by mouth every evening.     cromolyn (OPTICROM) 4 % ophthalmic solution Place 1 drop into both eyes 4 (four) times daily as needed (itchy/watery eyes). 10 mL 3   diphenhydrAMINE (BENADRYL) 25 MG tablet Take 25 mg by mouth every 6 (six) hours as needed for allergies or itching.     EPINEPHrine 0.3 mg/0.3 mL IJ SOAJ injection Inject 0.3 mg into the muscle as needed for anaphylaxis. 2 each 1   estrogens, conjugated, (PREMARIN) 0.625 MG tablet Take 1 tablet (0.625 mg total) by mouth daily. Take daily for 21 days then do not take for 7 days. (Patient taking differently: Take 0.625 mg by mouth every evening. Take daily for 21 days then do not take for 7 days.) 30 tablet 11   ferrous sulfate 325 (65 FE) MG tablet TAKE 1 TABLET BY MOUTH DAILY 60 tablet 2   fexofenadine (ALLEGRA) 180 MG tablet Take 180 mg by mouth every evening.     fluticasone (FLONASE) 50 MCG/ACT nasal spray Place 1 spray into both nostrils 2 (two) times daily as needed (nasal congestion). (Patient not taking: Reported on 01/15/2023) 16 g 3   ibuprofen (ADVIL) 200 MG tablet Take 400-800 mg by mouth every 8 (eight) hours as needed (pain.).     ketorolac (ACULAR) 0.5 % ophthalmic solution Place 1 drop into the right eye 4 (four) times daily. 3  mL 0   LINZESS 290 MCG CAPS capsule TAKE 1 CAPSULE BY MOUTH DAILY BEFORE BREAKFAST 30 capsule 0   Melatonin 10 MG TABS Take 10 mg by mouth at bedtime.     methocarbamol (ROBAXIN-750) 750 MG tablet Take 1 tablet (750 mg total) by mouth 4 (four) times daily. 30 tablet 0   MIRALAX 17 GM/SCOOP  powder Take 17-34 g by mouth daily. Mix 17 g of powder into water and drink once a day     Olopatadine HCl (PATADAY) 0.2 % SOLN Place 1-2 drops into both eyes 3 (three) times daily as needed (allergies eye).     Omega-3 Fatty Acids (FISH OIL) 1200 MG CAPS Take 1,200 mg by mouth daily.     oxyCODONE-acetaminophen (PERCOCET) 5-325 MG tablet Take 1 tablet by mouth every 4 (four) hours as needed for severe pain. 20 tablet 0   promethazine (PHENERGAN) 12.5 MG tablet Take 1 tablet (12.5 mg total) by mouth every 8 (eight) hours as needed for nausea. 30 tablet 1   rizatriptan (MAXALT-MLT) 10 MG disintegrating tablet May repeat in 2 hours if needed 10 tablet 5   sennosides-docusate sodium (SENOKOT-S) 8.6-50 MG tablet Take 2-3 tablets by mouth daily as needed for constipation.     sulfacetamide (BLEPH-10) 10 % ophthalmic solution Place 1-2 drops into the right eye every 3 (three) hours. Use while awake (Patient taking differently: Place 1-2 drops into the right eye every 3 (three) hours as needed (eye irritation.). Use while awake) 5 mL 0   triamcinolone cream (KENALOG) 0.1 % Apply 1 Application topically 2 (two) times daily as needed (rash, itching). 45 g 1   VITAMIN A PO Take 3,000 mcg by mouth every evening.     zinc sulfate 220 (50 Zn) MG capsule Take 220 mg by mouth every evening.     No current facility-administered medications for this visit.   No results found.  Review of Systems:   A ROS was performed including pertinent positives and negatives as documented in the HPI.  Physical Exam :   Constitutional: NAD and appears stated age Neurological: Alert and oriented Psych: Appropriate affect and cooperative There were no vitals taken for this visit.   Comprehensive Musculoskeletal Exam:    Patient is in a knee brace and ambulates with antalgic gait.  Active range of motion 0 to 90 degrees.  Patella mobile and nontender.  Distal neurosensory exam intact.   Imaging:   MRI (Left  knee): Negative   I personally reviewed and interpreted the radiographs.   Assessment:   37 y.o. female presenting for follow-up and MRI review of the left knee.  Her MRI images appear negative with no evidence of meniscal injury.  She has had minimal pain relief in the knee still feels unstable.  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  We would like to get her in for physical therapy for a patellofemoral program with focus on strengthening and stability.  She has been seen previously [facility] for aquatic therapy.  Recommended that for pain she continue with Tylenol and can add use of a topical NSAID or lidocaine.  Will follow-up in 8 weeks with for reassessment  Plan :    -Referral to PT and follow up in 8 weeks     I personally saw and evaluated the patient, and participated in the management and treatment plan.  Hazle Nordmann, PA-C Orthopedics  This document was dictated using Dragon  voice recognition software. A reasonable attempt at proof reading has been made to minimize errors.

## 2023-03-15 ENCOUNTER — Ambulatory Visit (INDEPENDENT_AMBULATORY_CARE_PROVIDER_SITE_OTHER): Payer: Medicaid Other | Admitting: Clinical

## 2023-03-15 DIAGNOSIS — Z658 Other specified problems related to psychosocial circumstances: Secondary | ICD-10-CM

## 2023-03-15 DIAGNOSIS — F909 Attention-deficit hyperactivity disorder, unspecified type: Secondary | ICD-10-CM

## 2023-03-15 DIAGNOSIS — R339 Retention of urine, unspecified: Secondary | ICD-10-CM | POA: Diagnosis not present

## 2023-03-15 DIAGNOSIS — F4310 Post-traumatic stress disorder, unspecified: Secondary | ICD-10-CM

## 2023-03-15 NOTE — Patient Instructions (Addendum)
[facility] at [facility] [address] [place], [place] [address] [phone] (main office) [phone] (Davinci Glotfelty's office)  SCORE: For the life of your business [email or web address]   Travel Advisories [email or web address]

## 2023-03-18 NOTE — Progress Notes (Unsigned)
Follow Up Note  RE: Rachel [name] MRN: [id number] DOB: 1986/05/18 Date of Office Visit: 03/19/2023  Referring provider: Hoy Register, MD Primary care provider: Hoy Register, MD  Chief Complaint: No chief complaint on file.  History of Present Illness: I had the pleasure of seeing Rachel [name] for a follow up visit at the [facility] on 03/18/2023. She is a 37 y.o. female, who is being followed for allergic rhinoconjunctivitis on AIT, rash, allergic reaction, multiple drug allergies, recurrent infections, asthma, adverse food reaction. Her previous allergy office visit was on 12/04/2022 with Dr. Selena Batten. Today is a regular follow up visit.  01/04/2023   [name] Doffing started injections for   M-C-D  Please call patient.   I reviewed the bloodwork. Blood count, kidney function, liver function, electrolytes, thyroid, autoimmune screener, inflammation markers, chronic urticaria index (checks for autoantibodies that trigger mast cells), tryptase (checks for mast cell issues) and alpha gal (checks for red meat allergy) were all normal which is great.   Environmental panel positive to cat and dog. Does she still want to do the allergy shots? Then I will put in order to mix.   I wonder if the itchy rash may be flared by the above allergens. Latex was negative. Continue to avoid though due to your past clinical history.   Your immunoglobulin levels were normal You also have good protection against diptheria and tetanus. Your pneumococcal titers were adequate. Sometimes people with low titers are more likely to develop respiratory infections caused by the bacteria strep pneumoniae. No immunodeficiency based on these results. Keep track of infections and antibiotics use.  Other allergic rhinitis Conjunctivitis symptoms which flares in the fall and winter.  Skin testing over 10 years ago showed multiple positives per patient report.  No prior  AIT.  Sinus surgery in the past.  2 dogs and 2 cats at home. Today's skin prick testing showed: Positive to mold, cat and dog only. Get bloodwork as positive control was only borderline positive.  Start environmental control measures as below. Use over the counter antihistamines such as Zyrtec (cetirizine), Claritin (loratadine), Allegra (fexofenadine), or Xyzal (levocetirizine) daily as needed. May take twice a day during allergy flares. May switch antihistamines every few months. Use Flonase (fluticasone) nasal spray 1 spray per nostril twice a day as needed for nasal congestion.  Nasal saline spray (i.e., Simply Saline) or nasal saline lavage (i.e., NeilMed) is recommended as needed and prior to medicated nasal sprays. Use cromolyn 4% 1 drop in each eye up to four times a day as needed for itchy/watery eyes.  Start allergy injections. Will make the vials after the bloodwork is back.  Had a detailed discussion with patient/family that clinical history is suggestive of allergic rhinitis, and may benefit from allergy immunotherapy (AIT). Discussed in detail regarding the dosing, schedule, side effects (mild to moderate local allergic reaction and rarely systemic allergic reactions including anaphylaxis), and benefits (significant improvement in nasal symptoms, seasonal flares of asthma) of immunotherapy with the patient. There is significant time commitment involved with allergy shots, which includes weekly immunotherapy injections for first 9-12 months and then biweekly to monthly injections for 3-5 years.  I have prescribed epinephrine injectable and demonstrated proper use. For mild symptoms you can take over the counter antihistamines such as Benadryl and monitor symptoms closely. If symptoms worsen or if you have severe symptoms including breathing issues, throat closure, significant swelling, whole body hives, severe diarrhea and vomiting, lightheadedness then inject  epinephrine and seek immediate  medical care afterwards. Emergency action plan given.   Allergic conjunctivitis of both eyes See assessment and plan as above.   Rash and other nonspecific skin eruption Breaking out in random itchy rashes since 2015. No specific triggers noted but has a flare every 1-2 months. Symptoms seems to lasts for a few weeks at a time. History of eczema. No prior dermatology evaluation. Discussed with patient that I'm not sure how much of the above allergens are contributing to her skin issues.  See below for proper skin care. Keep track of outbreaks and take pictures. May need to see dermatology next.  Continue with allegra 180mg  once a day - may take twice a day during flares. If symptoms are not controlled or causes drowsiness let us know. Avoid the following potential triggers: alcohol, tight clothing, NSAIDs, hot showers and getting overheated. Get bloodwork to rule out other etiologies.    Allergic reaction Patient had multiple reaction to various medications but then has milder allergic reactions with no known triggers. Keep track of symptoms and episodes.  Get bloodwork. For mild symptoms you can take over the counter antihistamines such as Benadryl and monitor symptoms closely. If symptoms worsen or if you have severe symptoms including breathing issues, throat closure, significant swelling, whole body hives, severe diarrhea and vomiting, lightheadedness then inject epinephrine and seek immediate medical care afterwards.   Multiple drug allergies Reactions to multiple drugs. Dewayne Hatch required ER visit with IM epi. Penicillin reaction as a young child in the form of rash/hives. Avoid drugs on allergy list. Recommend penicillin skin testing first.  If interested we can schedule drug challenge to antibiotics - one at a time. You must be off antihistamines for 3-5 days before. Must be in good health and not ill. No vaccines/injections/antibiotics within the past 7 days. Plan on being in the  office for 2-3 hours and must bring in the drug you want to do the oral challenge for - will send in prescription to pick up a few days before. You must call to schedule an appointment and specify it's for a drug challenge.    Recurrent infections Sepsis x 2,  pneumonia, ear infections. Usually needs 2-4 courses of antibiotics per year. Keep track of infections and antibiotics use. Get bloodwork to look at immune system.    Mild intermittent asthma without complication Usually flares with cold weather and infection. Tried Singulair and Advair in the past.  Today's spirometry was normal. May use albuterol rescue inhaler 2 puffs or nebulizer every 4 to 6 hours as needed for shortness of breath, chest tightness, coughing, and wheezing.  Monitor frequency of use.    Other adverse food reactions, not elsewhere classified, subsequent encounter Pork causes perioral sores and tingling. Dairy flares her GI symptoms. Today's skin testing negative to select foods. Avoid pork and dairy. She may have lactose intolerance.    Return in about 3 months (around 03/05/2023).  Assessment and Plan: Stehanie is a 37 y.o. female with: No problem-specific Assessment & Plan notes found for this encounter.  No follow-ups on file.  No orders of the defined types were placed in this encounter.  Lab Orders  No laboratory test(s) ordered today    Diagnostics: Spirometry:  Tracings reviewed. Her effort: {Blank single:19197::"Good reproducible efforts.","It was hard to get consistent efforts and there is a question as to whether this reflects a maximal maneuver.","Poor effort, data can not be interpreted."} FVC: ***L FEV1: ***L, ***% predicted FEV1/FVC ratio: ***% Interpretation: {  Blank single:19197::"Spirometry consistent with mild obstructive disease","Spirometry consistent with moderate obstructive disease","Spirometry consistent with severe obstructive disease","Spirometry consistent with possible  restrictive disease","Spirometry consistent with mixed obstructive and restrictive disease","Spirometry uninterpretable due to technique","Spirometry consistent with normal pattern","No overt abnormalities noted given today's efforts"}.  Please see scanned spirometry results for details.  Skin Testing: {Blank single:19197::"Select foods","Environmental allergy panel","Environmental allergy panel and select foods","Food allergy panel","None","Deferred due to recent antihistamines use"}. *** Results discussed with patient/family.   Medication List:  Current Outpatient Medications  Medication Sig Dispense Refill   acetaminophen (TYLENOL) 500 MG tablet Take 500-1,000 mg by mouth every 6 (six) hours as needed for moderate pain or mild pain.     albuterol (PROVENTIL) (2.5 MG/3ML) 0.083% nebulizer solution Take 3 mLs (2.5 mg total) by nebulization every 6 (six) hours as needed for wheezing or shortness of breath. Max 4 doses per day 75 mL 0   albuterol (VENTOLIN HFA) 108 (90 Base) MCG/ACT inhaler Inhale 1-2 puffs into the lungs every 6 (six) hours as needed for wheezing or shortness of breath. 8 g 1   atorvastatin (LIPITOR) 20 MG tablet TAKE 1 TABLET BY MOUTH DAILY 90 tablet 1   B Complex Vitamins (B COMPLEX PO) Take 1 capsule by mouth every evening.     Calcium-Magnesium-Vitamin D 600-40-500 MG-MG-UNIT TB24 Take 1 tablet by mouth every evening.     cromolyn (OPTICROM) 4 % ophthalmic solution Place 1 drop into both eyes 4 (four) times daily as needed (itchy/watery eyes). 10 mL 3   diphenhydrAMINE (BENADRYL) 25 MG tablet Take 25 mg by mouth every 6 (six) hours as needed for allergies or itching.     EPINEPHrine 0.3 mg/0.3 mL IJ SOAJ injection Inject 0.3 mg into the muscle as needed for anaphylaxis. 2 each 1   estrogens, conjugated, (PREMARIN) 0.625 MG tablet Take 1 tablet (0.625 mg total) by mouth daily. Take daily for 21 days then do not take for 7 days. (Patient taking differently: Take 0.625 mg by  mouth every evening. Take daily for 21 days then do not take for 7 days.) 30 tablet 11   ferrous sulfate 325 (65 FE) MG tablet TAKE 1 TABLET BY MOUTH DAILY 60 tablet 2   fexofenadine (ALLEGRA) 180 MG tablet Take 180 mg by mouth every evening.     fluticasone (FLONASE) 50 MCG/ACT nasal spray Place 1 spray into both nostrils 2 (two) times daily as needed (nasal congestion). (Patient not taking: Reported on 01/15/2023) 16 g 3   ibuprofen (ADVIL) 200 MG tablet Take 400-800 mg by mouth every 8 (eight) hours as needed (pain.).     ketorolac (ACULAR) 0.5 % ophthalmic solution Place 1 drop into the right eye 4 (four) times daily. 3 mL 0   LINZESS 290 MCG CAPS capsule TAKE 1 CAPSULE BY MOUTH DAILY BEFORE BREAKFAST 30 capsule 0   Melatonin 10 MG TABS Take 10 mg by mouth at bedtime.     methocarbamol (ROBAXIN-750) 750 MG tablet Take 1 tablet (750 mg total) by mouth 4 (four) times daily. 30 tablet 0   MIRALAX 17 GM/SCOOP powder Take 17-34 g by mouth daily. Mix 17 g of powder into water and drink once a day     Olopatadine HCl (PATADAY) 0.2 % SOLN Place 1-2 drops into both eyes 3 (three) times daily as needed (allergies eye).     Omega-3 Fatty Acids (FISH OIL) 1200 MG CAPS Take 1,200 mg by mouth daily.     oxyCODONE-acetaminophen (PERCOCET) 5-325 MG tablet Take 1 tablet  by mouth every 4 (four) hours as needed for severe pain. 20 tablet 0   promethazine (PHENERGAN) 12.5 MG tablet Take 1 tablet (12.5 mg total) by mouth every 8 (eight) hours as needed for nausea. 30 tablet 1   rizatriptan (MAXALT-MLT) 10 MG disintegrating tablet May repeat in 2 hours if needed 10 tablet 5   sennosides-docusate sodium (SENOKOT-S) 8.6-50 MG tablet Take 2-3 tablets by mouth daily as needed for constipation.     sulfacetamide (BLEPH-10) 10 % ophthalmic solution Place 1-2 drops into the right eye every 3 (three) hours. Use while awake (Patient taking differently: Place 1-2 drops into the right eye every 3 (three) hours as needed (eye  irritation.). Use while awake) 5 mL 0   triamcinolone cream (KENALOG) 0.1 % Apply 1 Application topically 2 (two) times daily as needed (rash, itching). 45 g 1   VITAMIN A PO Take 3,000 mcg by mouth every evening.     zinc sulfate 220 (50 Zn) MG capsule Take 220 mg by mouth every evening.     No current facility-administered medications for this visit.   Allergies: Allergies  Allergen Reactions   Amitriptyline Anaphylaxis   Amphetamine-Dextroamphetamine Other (See Comments)    Triggered bipolar, caused depression, and suicidal thoughts   Lupron [Leuprolide] Anaphylaxis   Orilissa [Elagolix] Anaphylaxis   Penicillins Anaphylaxis, Swelling and Other (See Comments)    Potential for anaphylaxis confirmed. Tolerates Cefphalosporins No "-cillins"!!!! Did it involve swelling of the face/tongue/throat, SOB, or low BP? Yes Did it involve sudden or severe rash/hives, skin peeling, or any reaction on the inside of your mouth or nose? Unknown Did you need to seek medical attention at a hospital or doctor's office? Unknown When did it last happen? teenager      If all above answers are "NO", may proceed with cephalosporin use.   Wound Dressing Adhesive Rash   Betadine [Povidone-Iodine] Swelling and Rash   Chlorhexidine Hives    DuraPrep    Doxycycline Itching, Nausea And Vomiting and Other (See Comments)    Caused high fever, also   Mixed Ragweed Other (See Comments)    Unknown, Mother and pt state she can't have    Olanzapine Other (See Comments)    Caused mania   Other Other (See Comments)    Stimulants, mood stabilizers, and antidepressants = Triggered bipolar, caused depression, and suicidal thoughts   Prozac [Fluoxetine Hcl] Other (See Comments)    Triggered bipolar, caused depression, and suicidal thoughts   Stevia Glycerite Extract [Flavoring Agent] Other (See Comments)    Unknown reaction; mother and pt confirm any Artificial Sweeteners cause reaction   Tape Other (See Comments)     Paper tape is tolerated   Triple Antibiotic Pain Relief [Neomy-Bacit-Polymyx-Pramoxine] Hives   Reglan [Metoclopramide] Anxiety and Other (See Comments)    Triggered bipolar, caused depression, suicidal thoughts, muscle twitching, stiffness, and high anxiety    I reviewed her past medical history, social history, family history, and environmental history and no significant changes have been reported from her previous visit.  Review of Systems  Constitutional:  Positive for chills and fever. Negative for appetite change and unexpected weight change.  HENT:  Negative for congestion and rhinorrhea.   Eyes:  Negative for itching.  Respiratory:  Negative for cough, chest tightness, shortness of breath and wheezing.   Cardiovascular:  Negative for chest pain.  Gastrointestinal:  Positive for abdominal pain.  Genitourinary:  Negative for difficulty urinating.  Skin:  Positive for rash.  Allergic/Immunologic: Positive  for environmental allergies.  Neurological:  Positive for headaches.    Objective: LMP  (LMP Unknown)  There is no height or weight on file to calculate BMI. Physical Exam Vitals and nursing note reviewed.  Constitutional:      Appearance: Normal appearance. She is well-developed.  HENT:     Head: Normocephalic and atraumatic.     Right Ear: Tympanic membrane and external ear normal.     Left Ear: Tympanic membrane and external ear normal.     Nose: Nose normal.     Mouth/Throat:     Mouth: Mucous membranes are moist.     Pharynx: Oropharynx is clear.  Eyes:     Conjunctiva/sclera: Conjunctivae normal.  Cardiovascular:     Rate and Rhythm: Normal rate and regular rhythm.     Heart sounds: Normal heart sounds. No murmur heard.    No friction rub. No gallop.  Pulmonary:     Effort: Pulmonary effort is normal.     Breath sounds: Normal breath sounds. No wheezing, rhonchi or rales.  Musculoskeletal:     Cervical back: Neck supple.  Skin:    General: Skin is warm.      Findings: No rash.  Neurological:     Mental Status: She is alert and oriented to person, place, and time.  Psychiatric:        Behavior: Behavior normal.    Previous notes and tests were reviewed. The plan was reviewed with the patient/family, and all questions/concerned were addressed.  It was my pleasure to see Laury today and participate in her care. Please feel free to contact me with any questions or concerns.  Sincerely,  Wyline Mood, DO Allergy & Immunology  [facility] office: [phone] [facility] office: [phone]

## 2023-03-19 ENCOUNTER — Other Ambulatory Visit: Payer: Self-pay

## 2023-03-19 ENCOUNTER — Encounter: Payer: Self-pay | Admitting: Allergy

## 2023-03-19 ENCOUNTER — Ambulatory Visit: Payer: Medicaid Other | Admitting: Allergy

## 2023-03-19 ENCOUNTER — Ambulatory Visit: Payer: Self-pay | Admitting: *Deleted

## 2023-03-19 VITALS — BP 116/74 | HR 75 | Temp 97.9°F | Resp 18 | Ht 62.0 in | Wt 113.6 lb

## 2023-03-19 DIAGNOSIS — J309 Allergic rhinitis, unspecified: Secondary | ICD-10-CM

## 2023-03-19 DIAGNOSIS — J3089 Other allergic rhinitis: Secondary | ICD-10-CM

## 2023-03-19 DIAGNOSIS — T781XXD Other adverse food reactions, not elsewhere classified, subsequent encounter: Secondary | ICD-10-CM

## 2023-03-19 DIAGNOSIS — T7840XD Allergy, unspecified, subsequent encounter: Secondary | ICD-10-CM

## 2023-03-19 DIAGNOSIS — R21 Rash and other nonspecific skin eruption: Secondary | ICD-10-CM | POA: Diagnosis not present

## 2023-03-19 DIAGNOSIS — J4520 Mild intermittent asthma, uncomplicated: Secondary | ICD-10-CM

## 2023-03-19 DIAGNOSIS — L2089 Other atopic dermatitis: Secondary | ICD-10-CM

## 2023-03-19 DIAGNOSIS — Z889 Allergy status to unspecified drugs, medicaments and biological substances status: Secondary | ICD-10-CM | POA: Diagnosis not present

## 2023-03-19 DIAGNOSIS — B999 Unspecified infectious disease: Secondary | ICD-10-CM | POA: Diagnosis not present

## 2023-03-19 DIAGNOSIS — T50905D Adverse effect of unspecified drugs, medicaments and biological substances, subsequent encounter: Secondary | ICD-10-CM

## 2023-03-19 MED ORDER — ALBUTEROL SULFATE (2.5 MG/3ML) 0.083% IN NEBU: 2.5000 mg | INHALATION_SOLUTION | RESPIRATORY_TRACT | 1 refills | Status: DC | PRN

## 2023-03-19 MED ORDER — ALBUTEROL SULFATE HFA 108 (90 BASE) MCG/ACT IN AERS: 2.0000 | INHALATION_SPRAY | RESPIRATORY_TRACT | 1 refills | Status: DC | PRN

## 2023-03-19 NOTE — Assessment & Plan Note (Signed)
Past history - symptoms which flares in the fall and winter. Sinus surgery in the past.  2 dogs and 2 cats at home. 2024 skin prick testing showed: Positive to mold, cat and dog only. 2024 bloodwork positive to cat and dog. Interim history - started AIT on 01/04/2023 (M-C-D) with no issues. Already noted some benefit. Flonase caused epistaxis.  Continue environmental control measures as below. Use over the counter antihistamines such as Zyrtec (cetirizine), Claritin (loratadine), Allegra (fexofenadine), or Xyzal (levocetirizine) daily as needed. May take twice a day during allergy flares. May switch antihistamines every few months. Nasal saline spray (i.e., Simply Saline) or nasal saline lavage (i.e., NeilMed) is recommended as needed and prior to medicated nasal sprays. Use cromolyn 4% 1 drop in each eye up to four times a day as needed for itchy/watery eyes.  Continue allergy injections - given today.

## 2023-03-19 NOTE — Assessment & Plan Note (Signed)
Past history - sepsis x 2,  pneumonia, ear infections. Usually needs 2-4 courses of antibiotics per year. Interim history - normal immunoglobulin levels, tetanus, diphtheria and pneumococcal titers protective.  Had antibiotics for bladder issues. Keep track of infections and antibiotics use.

## 2023-03-19 NOTE — Assessment & Plan Note (Signed)
Past history - reactions to multiple drugs. Rachel [name] required ER visit with IM epi. Penicillin reaction as a young child in the form of rash/hives. Avoid drugs on allergy list. Recommend penicillin skin testing first.  We can discuss in more detail at next visit.

## 2023-03-19 NOTE — Assessment & Plan Note (Signed)
Past history -  usually flares with cold weather and infection. Tried Singulair and Advair in the past.  2024 spirometry was normal. Interim history - controlled. May use albuterol rescue inhaler 2 puffs or nebulizer every 4 to 6 hours as needed for shortness of breath, chest tightness, coughing, and wheezing.  Monitor frequency of use.

## 2023-03-19 NOTE — Assessment & Plan Note (Signed)
Past history - pork causes perioral sores and tingling. Dairy flares her GI symptoms. 2024 skin testing negative to select foods. Avoid pork and dairy. She may have lactose intolerance.

## 2023-03-19 NOTE — Assessment & Plan Note (Signed)
Past history - breaking out in random itchy rashes since 2015. No specific triggers noted but has a flare every 1-2 months. Symptoms seems to lasts for a few weeks at a time. History of eczema. No prior dermatology evaluation. Interim history - 2024 bloodwork (CBC diff, CMP, TSH, ANA, crp, esr, CU, tryptase and alpha-gal) normal. Decreased episodes. Continue proper skin care. Keep track of outbreaks and take pictures. Continue with allegra 180mg  once a day - may take twice a day during flares. If symptoms are not controlled or causes drowsiness let us know. Avoid the following potential triggers: alcohol, tight clothing, NSAIDs, hot showers and getting overheated.

## 2023-03-19 NOTE — Patient Instructions (Addendum)
Environmental allergies 2024 skin testing: Positive to mold, cat and dog. Continue environmental control measures as below. Use over the counter antihistamines such as Zyrtec (cetirizine), Claritin (loratadine), Allegra (fexofenadine), or Xyzal (levocetirizine) daily as needed. May take twice a day during allergy flares. May switch antihistamines every few months. Nasal saline spray (i.e., Simply Saline) or nasal saline lavage (i.e., NeilMed) is recommended as needed and prior to medicated nasal sprays. Use cromolyn 4% 1 drop in each eye up to four times a day as needed for itchy/watery eyes.  Continue allergy injections - given today.  Skin Continue proper skin care. Keep track of outbreaks and take pictures. Continue with allegra 180mg  once a day - may take twice a day during flares. If symptoms are not controlled or causes drowsiness let us know. Avoid the following potential triggers: alcohol, tight clothing, NSAIDs, hot showers and getting overheated.  Asthma May use albuterol rescue inhaler 2 puffs or nebulizer every 4 to 6 hours as needed for shortness of breath, chest tightness, coughing, and wheezing.  Monitor frequency of use.  Will get breathing test at next visit.   Allergic reactions Keep track of symptoms and episodes.  For mild symptoms you can take over the counter antihistamines such as Benadryl and monitor symptoms closely. If symptoms worsen or if you have severe symptoms including breathing issues, throat closure, significant swelling, whole body hives, severe diarrhea and vomiting, lightheadedness then inject epinephrine and seek immediate medical care afterwards.  Food Avoid pork.  Multiple drug allergies Avoid drugs on allergy list. Recommend penicillin skin testing first.  We can discuss in more detail at next visit.   Recurrent infections Keep track of infections and antibiotics use.  Follow up in 6 months or sooner if needed.    Pet Allergen  Avoidance: Contrary to popular opinion, there are no "hypoallergenic" breeds of dogs or cats. That is because people are not allergic to an animal's hair, but to an allergen found in the animal's saliva, dander (dead skin flakes) or urine. Pet allergy symptoms typically occur within minutes. For some people, symptoms can build up and become most severe 8 to 12 hours after contact with the animal. People with severe allergies can experience reactions in public places if dander has been transported on the pet owners' clothing. Keeping an animal outdoors is only a partial solution, since homes with pets in the yard still have higher concentrations of animal allergens. Before getting a pet, ask your allergist to determine if you are allergic to animals. If your pet is already considered part of your family, try to minimize contact and keep the pet out of the bedroom and other rooms where you spend a great deal of time. As with dust mites, vacuum carpets often or replace carpet with a hardwood floor, tile or linoleum. High-efficiency particulate air (HEPA) cleaners can reduce allergen levels over time. While dander and saliva are the source of cat and dog allergens, urine is the source of allergens from rabbits, hamsters, mice and [place] pigs; so ask a non-allergic family member to clean the animal's cage. If you have a pet allergy, talk to your allergist about the potential for allergy immunotherapy (allergy shots). This strategy can often provide long-term relief. Mold Control Mold and fungi can grow on a variety of surfaces provided certain temperature and moisture conditions exist.  Outdoor molds grow on plants, decaying vegetation and soil. The major outdoor mold, Alternaria and Cladosporium, are found in very high numbers during hot and dry  conditions. Generally, a late summer - fall peak is seen for common outdoor fungal spores. Rain will temporarily lower outdoor mold spore count, but counts rise  rapidly when the rainy period ends. The most important indoor molds are Aspergillus and Penicillium. Dark, humid and poorly ventilated basements are ideal sites for mold growth. The next most common sites of mold growth are the bathroom and the kitchen. Outdoor (Seasonal) Mold Control Use air conditioning and keep windows closed. Avoid exposure to decaying vegetation. Avoid leaf raking. Avoid grain handling. Consider wearing a face mask if working in moldy areas.  Indoor (Perennial) Mold Control  Maintain humidity below 50%. Get rid of mold growth on hard surfaces with water, detergent and, if necessary, 5% bleach (do not mix with other cleaners). Then dry the area completely. If mold covers an area more than 10 square feet, consider hiring an indoor environmental professional. For clothing, washing with soap and water is best. If moldy items cannot be cleaned and dried, throw them away. Remove sources e.g. contaminated carpets. Repair and seal leaking roofs or pipes. Using dehumidifiers in damp basements may be helpful, but empty the water and clean units regularly to prevent mildew from forming. All rooms, especially basements, bathrooms and kitchens, require ventilation and cleaning to deter mold and mildew growth. Avoid carpeting on concrete or damp floors, and storing items in damp areas.   Skin care recommendations  Bath time: Always use lukewarm water. AVOID very hot or cold water. Keep bathing time to 5-10 minutes. Do NOT use bubble bath. Use a mild soap and use just enough to wash the dirty areas. Do NOT scrub skin vigorously.  After bathing, pat dry your skin with a towel. Do NOT rub or scrub the skin.  Moisturizers and prescriptions:  ALWAYS apply moisturizers immediately after bathing (within 3 minutes). This helps to lock-in moisture. Use the moisturizer several times a day over the whole body. Good summer moisturizers include: Aveeno, CeraVe, Cetaphil. Good winter  moisturizers include: Aquaphor, Vaseline, Cerave, Cetaphil, Eucerin, Vanicream. When using moisturizers along with medications, the moisturizer should be applied about one hour after applying the medication to prevent diluting effect of the medication or moisturize around where you applied the medications. When not using medications, the moisturizer can be continued twice daily as maintenance.  Laundry and clothing: Avoid laundry products with added color or perfumes. Use unscented hypo-allergenic laundry products such as Tide free, Cheer free & gentle, and All free and clear.  If the skin still seems dry or sensitive, you can try double-rinsing the clothes. Avoid tight or scratchy clothing such as wool. Do not use fabric softeners or dyer sheets.

## 2023-03-19 NOTE — Assessment & Plan Note (Signed)
Past history - multiple reaction to various medications but then has milder allergic reactions with no known triggers. Interim history - 1 mild episode which resolved with Benadryl and famotidine. No trigger noted.  Keep track of symptoms and episodes.  For mild symptoms you can take over the counter antihistamines such as Benadryl and monitor symptoms closely. If symptoms worsen or if you have severe symptoms including breathing issues, throat closure, significant swelling, whole body hives, severe diarrhea and vomiting, lightheadedness then inject epinephrine and seek immediate medical care afterwards.

## 2023-03-19 NOTE — Assessment & Plan Note (Deleted)
Avoid drugs on allergy list. Recommend penicillin skin testing first.  We can discuss in more detail at next visit.

## 2023-03-20 NOTE — BH Specialist Note (Addendum)
Integrated Behavioral Health via Telemedicine Visit  03/20/2023 Rhiannan Modzelewski [id number]  Number of Integrated Behavioral Health Clinician visits: Additional Visit  Session Start time: 1047   Session End time: 1202  Total time in minutes: 75   Referring Provider: Mariel Aloe, MD Patient/Family location: [facility] Provider location: [facility]  All persons participating in visit: Patient Terrisha Enevoldsen and [facility] Lai Hendriks   Types of Service: Individual psychotherapy and Telephone visit  I connected with [name] and/or [name] Schara's  n/a  via  Telephone or Video Enabled Telemedicine Application  (Video is Caregility application) and verified that I am speaking with the correct person using two identifiers. Discussed confidentiality: Yes   I discussed the limitations of telemedicine and the availability of in person appointments.  Discussed there is a possibility of technology failure and discussed alternative modes of communication if that failure occurs.  I discussed that engaging in this telemedicine visit, they consent to the provision of behavioral healthcare and the services will be billed under their insurance.  Patient and/or legal guardian expressed understanding and consented to Telemedicine visit: Yes   Presenting Concerns: Patient and/or family reports the following symptoms/concerns: Processing options in life, triggered by recent life changes.  Duration of problem: Ongoing; Severity of problem: moderate  Patient and/or Family's Strengths/Protective Factors: Social connections, Concrete supports in place (healthy food, safe environments, etc.), and Sense of purpose  Goals Addressed: Patient will:  Reduce symptoms of: anxiety, depression, and stress    Demonstrate ability to: Increase healthy adjustment to current life circumstances  Progress towards  Goals: Ongoing  Interventions: Interventions utilized:  Supportive Reflection Standardized Assessments completed: Not Needed  Patient and/or Family Response: Patient agrees with treatment plan.   Assessment: Patient currently experiencing ADHD, PTSD, Psychosocial stress.   Patient may benefit from continued therapeutic interventions.  Plan: Follow up with behavioral health clinician on : Two weeks Behavioral recommendations:  -Continue daily self-coping strategies, with priority on keeping healthy boundaries and healthy communication -Continue to consider carefully pros/cons of possible upcoming life decisions Referral(s): Integrated [employer] (In Clinic)  I discussed the assessment and treatment plan with the patient and/or parent/guardian. They were provided an opportunity to ask questions and all were answered. They agreed with the plan and demonstrated an understanding of the instructions.   They were advised to call back or seek an in-person evaluation if the symptoms worsen or if the condition fails to improve as anticipated.  [name] Ziv Welchel, LCSW     11/02/2022    9:23 AM 08/02/2022    2:20 PM 08/02/2022    2:11 PM 05/02/2022    2:26 PM 03/06/2022    9:40 AM  Depression screen PHQ 2/9  Decreased Interest 0 0 0 0 0  Down, Depressed, Hopeless 1 0 0 0 0  PHQ - 2 Score 1 0 0 0 0  Altered sleeping 1 2  2    Tired, decreased energy 1 2  1    Change in appetite 1 1  2    Feeling bad or failure about yourself  0 0  0   Trouble concentrating 0 1  1   Moving slowly or fidgety/restless 0 0  0   Suicidal thoughts 0 0  0   PHQ-9 Score 4 6  6        11/02/2022    9:24 AM 08/02/2022    2:20 PM 05/02/2022    2:26 PM 01/30/2022  9:56 AM  GAD 7 : Generalized Anxiety Score  Nervous, Anxious, on Edge 1 0 0 0  Control/stop worrying 0 0 0 0  Worry too much - different things 0 0 0 0  Trouble relaxing 1 1 2 1   Restless 0 0 0 0  Easily annoyed or irritable 0 0 1 0   Afraid - awful might happen 0 0 0 0  Total GAD 7 Score 2 1 3  1

## 2023-03-23 ENCOUNTER — Encounter: Payer: Medicaid Other | Admitting: Neurology

## 2023-04-03 ENCOUNTER — Ambulatory Visit (INDEPENDENT_AMBULATORY_CARE_PROVIDER_SITE_OTHER): Payer: Medicaid Other | Admitting: Clinical

## 2023-04-03 DIAGNOSIS — F909 Attention-deficit hyperactivity disorder, unspecified type: Secondary | ICD-10-CM | POA: Diagnosis not present

## 2023-04-03 DIAGNOSIS — Z658 Other specified problems related to psychosocial circumstances: Secondary | ICD-10-CM

## 2023-04-03 DIAGNOSIS — F4310 Post-traumatic stress disorder, unspecified: Secondary | ICD-10-CM

## 2023-04-04 ENCOUNTER — Ambulatory Visit (INDEPENDENT_AMBULATORY_CARE_PROVIDER_SITE_OTHER): Payer: Medicaid Other

## 2023-04-04 ENCOUNTER — Other Ambulatory Visit: Payer: Self-pay | Admitting: Obstetrics and Gynecology

## 2023-04-04 ENCOUNTER — Ambulatory Visit (HOSPITAL_BASED_OUTPATIENT_CLINIC_OR_DEPARTMENT_OTHER): Payer: Medicaid Other | Attending: Student | Admitting: Physical Therapy

## 2023-04-04 ENCOUNTER — Other Ambulatory Visit: Payer: Self-pay

## 2023-04-04 ENCOUNTER — Encounter (HOSPITAL_BASED_OUTPATIENT_CLINIC_OR_DEPARTMENT_OTHER): Payer: Self-pay | Admitting: Physical Therapy

## 2023-04-04 DIAGNOSIS — J309 Allergic rhinitis, unspecified: Secondary | ICD-10-CM

## 2023-04-04 DIAGNOSIS — R2689 Other abnormalities of gait and mobility: Secondary | ICD-10-CM | POA: Diagnosis not present

## 2023-04-04 DIAGNOSIS — Z90710 Acquired absence of both cervix and uterus: Secondary | ICD-10-CM

## 2023-04-04 DIAGNOSIS — M25362 Other instability, left knee: Secondary | ICD-10-CM | POA: Diagnosis not present

## 2023-04-04 DIAGNOSIS — M6281 Muscle weakness (generalized): Secondary | ICD-10-CM | POA: Diagnosis not present

## 2023-04-04 DIAGNOSIS — M25662 Stiffness of left knee, not elsewhere classified: Secondary | ICD-10-CM | POA: Insufficient documentation

## 2023-04-04 DIAGNOSIS — M25562 Pain in left knee: Secondary | ICD-10-CM | POA: Insufficient documentation

## 2023-04-04 NOTE — Therapy (Signed)
OUTPATIENT PHYSICAL THERAPY LOWER EXTREMITY EVALUATION   Patient Name: Rachel [name] MRN: [id number] DOB:July 05, 1986, 37 y.o., female Today's Date: 04/04/2023  END OF SESSION:  PT End of Session - 04/04/23 1502     Visit Number 1    Number of Visits 20    Date for PT Re-Evaluation 06/13/23    Authorization Type Mcd    PT Start Time 0800    PT Stop Time 0844    PT Time Calculation (min) 44 min    Activity Tolerance Patient tolerated treatment well    Behavior During Therapy [facility] for tasks assessed/performed             Past Medical History:  Diagnosis Date   ADHD (attention deficit hyperactivity disorder)    Anemia    Anxiety    Asthma    Bipolar disorder (HCC)    Complication of anesthesia    slow to wake up    Delayed gastric emptying 02/23/2020   Depression    DUB (dysfunctional uterine bleeding) 02/23/2020   Eczema    Ehlers-Danlos syndrome    Endometriosis    Gastroparesis    GERD (gastroesophageal reflux disease)    Heart murmur    Hiatal hernia 04/06/2020   Myalgia 04/06/2020   Nausea & vomiting 09/22/2022   Parasitic infection 02/23/2020   Pneumonia    Poor appetite 02/23/2020   Recurrent upper respiratory infection (URI)    RUQ pain 02/23/2020   Vitamin D deficiency    Weight loss 02/23/2020   Past Surgical History:  Procedure Laterality Date   barium study     COLONOSCOPY     CYSTOSCOPY N/A 07/12/2021   Procedure: CYSTOSCOPY;  Surgeon: Warden Fillers, MD;  Location: [facility] OR;  Service: Gynecology;  Laterality: N/A;   ESOPHAGOGASTRODUODENOSCOPY  2011   and a ph study as well.    LAPAROSCOPIC GASTRIC RESECTION N/A 01/22/2023   Procedure: LAPAROSCOPIC GASTRIC PEXY;  Surgeon: Quentin Ore, MD;  Location: [facility] ORS;  Service: General;  Laterality: N/A;   LAPAROSCOPY N/A 01/26/2021   Procedure: LAPAROSCOPY DIAGNOSTIC;  Surgeon: Warden Fillers, MD;  Location:  SURGERY CENTER;  Service: Gynecology;  Laterality: N/A;   NASAL  SEPTUM SURGERY     SINOSCOPY     TONGUE FLAP     TOTAL LAPAROSCOPIC HYSTERECTOMY WITH SALPINGECTOMY Bilateral 07/12/2021   Procedure: TOTAL LAPAROSCOPIC HYSTERECTOMY WITH SALPINGECTOMY, OOPHORECTOMY;  Surgeon: Warden Fillers, MD;  Location: [facility] OR;  Service: Gynecology;  Laterality: Bilateral;   UPPER GASTROINTESTINAL ENDOSCOPY     WISDOM TOOTH EXTRACTION     Patient Active Problem List   Diagnosis Date Noted   Chronic migraine w/o aura w/o status migrainosus, not intractable 02/23/2023   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   Memory loss 12/25/2022   Paresthesia 12/25/2022   Other allergic rhinitis 12/04/2022   Allergic conjunctivitis of both eyes 12/04/2022   Mild intermittent asthma without complication 12/04/2022   Other atopic dermatitis 12/04/2022   Allergic reaction 12/04/2022   Rash and other nonspecific skin eruption 12/04/2022   Multiple drug allergies 12/04/2022   Other adverse food reactions, not elsewhere classified, subsequent encounter 12/04/2022   Abnormal CT of the abdomen 09/23/2022   Abdominal pain 09/22/2022   Asthma, chronic 09/22/2022   HLD (hyperlipidemia) 09/22/2022   Nausea & vomiting 09/22/2022   Iron deficiency anemia 01/20/2022   Acute hyponatremia 01/18/2022   Pyelonephritis 01/18/2022   Protein-calorie malnutrition, severe 01/18/2022   sepsis secondary to pyelonephritis of left kidney 01/17/2022  Sepsis (HCC) 01/17/2022   Chronic abdominal pain 01/17/2022   Dyspnea 01/17/2022   Underweight 01/17/2022   Irritable bowel syndrome (IBS) 01/17/2022   Hypokalemia 01/17/2022   Vasomotor symptoms due to menopause 08/17/2021   S/P laparoscopic hysterectomy 07/12/2021   Preoperative exam for gynecologic surgery 06/20/2021   Draining postoperative wound 02/28/2021   Encounter for postoperative care 02/16/2021   Endometriosis determined by laparoscopy    Pelvic pain in female 10/11/2020   Presence of subdermal contraceptive implant 10/11/2020   Dysmenorrhea  10/11/2020   Dyspareunia in female 10/11/2020   NSVT (nonsustained ventricular tachycardia) (HCC) 09/03/2020   Gastroparesis 08/18/2020   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   Palpitations 07/26/2020   Family history of connective tissue disease 07/26/2020   Myalgia 04/06/2020   Hiatal hernia 04/06/2020   Recurrent infections 02/23/2020   Weight loss 02/23/2020   Poor appetite 02/23/2020   RUQ pain 02/23/2020   Delayed gastric emptying 02/23/2020   DUB (dysfunctional uterine bleeding) 02/23/2020   Cervical radiculopathy 05/31/2018   Hyperalgesia 05/31/2018   Muscle weakness 03/13/2018   GERD (gastroesophageal reflux disease) 12/26/2017   ADHD (attention deficit hyperactivity disorder) 05/07/2012   Bipolar 1 disorder (HCC) 05/07/2012    PCP: Dr Hoy Register   REFERRING PROVIDER: Hazle Nordmann PA   REFERRING DIAG:  Diagnosis  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 Yes: NPRS scale: 7/10 today 6/10  Pain location: medial knee and deep inside Pain description: aching  Aggravating factors: Sitting is the worst; standing is slightly better but gets worse  Relieving factors: good weather; elevating   PRECAUTIONS: None  WEIGHT BEARING RESTRICTIONS: No  FALLS:  Has patient fallen in last 6 months? No close to falls at times   LIVING ENVIRONMENT: Steps inside and outside   OCCUPATION:  Unable to work   Hobbies: Careers information officer sports  Traveling  Music       PLOF: Independent  PATIENT GOALS: to build strength and to walk better   NEXT MD VISIT:   OBJECTIVE:    DIAGNOSTIC FINDINGS: MRI: (-) for acute injury   PATIENT SURVEYS:  LEFS 17  COGNITION: Overall cognitive status: Within functional limits for tasks assessed     SENSATION: Reports numbness and tingling in her left hand at times  EDEMA:  MUSCLE LENGTH: POSTURE: No Significant postural limitations  PALPATION: Tender to palpation in medial knee  LOWER EXTREMITY ROM:  Active ROM Right eval Left eval  Hip flexion    Hip extension    Hip abduction    Hip adduction    Hip internal rotation    Hip external rotation    Knee flexion  40 with significant pain   Knee extension  0  Ankle dorsiflexion    Ankle plantarflexion    Ankle inversion    Ankle eversion     (Blank rows = not tested)  LOWER EXTREMITY MMT:  MMT Right eval Left eval  Hip flexion 13.9 <5   Hip extension    Hip abduction 15.4 <5  Hip adduction    Hip internal rotation    Hip external rotation  Knee flexion    Knee extension 17.5 <5  Ankle dorsiflexion    Ankle plantarflexion    Ankle inversion    Ankle eversion     (Blank rows = not tested)  LOWER EXTREMITY SPECIAL TESTS:   FUNCTIONAL TESTS:  Unable to squat and bend knee   GAIT: Ambulates with stiff left knee, decreased weightbearing on left side, increased hip circumduction on left side.  Patient initially ambulating with cane on left side.  She was shifting more weight onto the left leg.  Therapy adjusted came to the right side.  Patient had improved gait pattern with cane on the right side.  TODAY'S TREATMENT:                                                                                                                              DATE:   Gait training: See above   Exercises - Supine Quad Set  - 5 x daily - 7 x weekly - 1 sets - 5 reps - 2-3sec  hold - Supine Heel Slide with Strap  - 1 x daily - 7 x weekly - 1 sets - 3 reps - 20 se hold  PATIENT EDUCATION:  Education details: HEP; symptom management; benefits of aquatics   Person educated: Patient Education method: Explanation, Demonstration, Tactile cues, Verbal cues, and Handouts Education comprehension: verbalized understanding, returned demonstration, verbal cues required, tactile cues required, and needs further education  HOME EXERCISE PROGRAM: Access Code: [email or web address] URL: [email or web address] Date: 04/04/2023 Prepared by: Lorayne Bender  ASSESSMENT:  CLINICAL IMPRESSION: Patient is a 37 year old female presents to physical therapy with an acute left knee injury on February 17, 2023.  She reported a dislocation with relocation of her patella.  MRI reveals no structural damage to her knee.  She presents with a significant flexion movement limitation at this time.  She reports difficulty performing any activity that involves bending her knee.  Her strength measurements measured less than 5 pounds with all major muscle groups on the left side.  Left knee extension not measured but is soon to be less than 5 pounds secondary to difficulty moving it against gravity.  She has been using a cane for ambulation.  Therapy moved her cane to the right side to allow for a better gait pattern.  She has had success with pool therapy in the past.  She would benefit from aquatic therapy to improve her ability to weight-bear and bend her left knee.  We will continue to progress her exercises to improve her general functional mobility.  OBJECTIVE IMPAIRMENTS: Abnormal gait, decreased activity tolerance, decreased knowledge of use of DME, difficulty walking, decreased ROM, decreased strength, postural dysfunction, and pain.   ACTIVITY LIMITATIONS: carrying, lifting, bending, sitting, standing, squatting, stairs, transfers, bed mobility, dressing, and locomotion level  PARTICIPATION LIMITATIONS: meal prep, cleaning, laundry, driving, shopping, occupation, yard work, and school  PERSONAL FACTORS: 3+ comorbidities: anxiety; gastroperiesis, EDH,   are also affecting  patient's  functional outcome.   REHAB POTENTIAL: Good  CLINICAL DECISION MAKING: Unstable/unpredictablesignificantly limited ability to bend knee with progressive loss of general mobility  EVALUATION COMPLEXITY: High   GOALS: Goals reviewed with patient? Yes  SHORT TERM GOALS: Target date: 05/09/2023   Patient will increase passive knee flexion to 80 degrees Baseline: Goal status: INITIAL  2.  Patient will get on and off the plinth without having to use hands to help her left leg Baseline:  Goal status: INITIAL  3.  Patient will be independent with basic land and water HEP Baseline:  Goal status: INITIAL  4.  Patient will transfer sit to stand without significant increase in pain Baseline:  Goal status: INITIAL  5.  Patient will demonstrate greater than 5 pounds of strength in left lower extremity Baseline:  Goal status: INITIAL   LONG TERM GOALS: Target date: 06/13/2023    Patient will stand for 20 minutes without increased pain or to perform ADLs Baseline:  Goal status: INITIAL  2.  Patient will go up and down 8 steps without increased pain in order to improve her ability to get in and out of her house Baseline:  Goal status: INITIAL  3.  Patient will squat to pick item off the floor in order to perform ADLs without increased left knee pain Baseline:  Goal status: INITIAL  PLAN:  PT FREQUENCY: 2x/week  PT DURATION: 10 weeks  PLANNED INTERVENTIONS: Therapeutic exercises, Therapeutic activity, Neuromuscular re-education, Balance training, Gait training, Patient/Family education, Self Care, Joint mobilization, Stair training, DME instructions, Aquatic Therapy, Dry Needling, Electrical stimulation, Cryotherapy, Moist heat, Ultrasound, and Manual therapy  PLAN FOR NEXT SESSION: Aquatics: Begin light weightbearing and knee flexion in the water.  Progress weightbearing as tolerated. Land; continue to work on progressing patient's ability to bend her own knee.   When able to bend knee better consider NuStep.  Continue with progressive strengthening of left quad and hip.  Next visit consider standing weight shifting.  Continue with gait training.  Check all possible CPT codes: 78295 - PT Re-evaluation, 97110- Therapeutic Exercise, [phone]- Neuro Re-education, [phone] - Gait Training, [phone] - Manual Therapy, [phone] - Therapeutic Activities, [phone] - Self Care, [phone] - Electrical stimulation (Manual), [id number] - Ultrasound, and [id number] - Aquatic therapy    Check all conditions that are expected to impact treatment: {Conditions expected to impact treatment:Musculoskeletal disorders   If treatment provided at initial evaluation, no treatment charged due to lack of authorization.      Dessie Coma, PT 04/04/2023, 3:47 PM

## 2023-04-04 NOTE — BH Specialist Note (Addendum)
Integrated Behavioral Health via Telemedicine Visit  04/17/2023 Trinitee Ciarrocchi [id number]  Number of Integrated Behavioral Health Clinician visits: Additional Visit  Session Start time: 1047   Session End time: 1206  Total time in minutes: 79   Referring Provider: Mariel Aloe, MD Patient/Family location: Home [facility] Provider location: Center for [facility]  All persons participating in visit: Patient Mariama Ednie and [facility] Seleen Walter   Types of Service: Individual psychotherapy and Telephone visit  I connected with [name] and/or [name] Orea's  n/a  via  Telephone or Video Enabled Telemedicine Application  (Video is Caregility application) and verified that I am speaking with the correct person using two identifiers. Discussed confidentiality: Yes   I discussed the limitations of telemedicine and the availability of in person appointments.  Discussed there is a possibility of technology failure and discussed alternative modes of communication if that failure occurs.  I discussed that engaging in this telemedicine visit, they consent to the provision of behavioral healthcare and the services will be billed under their insurance.  Patient and/or legal guardian expressed understanding and consented to Telemedicine visit: Yes   Presenting Concerns: Patient and/or family reports the following symptoms/concerns: Ongoing and increasing life stressful events. Duration of problem: Ongoing; Severity of problem: moderate  Patient and/or Family's Strengths/Protective Factors: Social connections, Concrete supports in place (healthy food, safe environments, etc.), and Sense of purpose  Goals Addressed: Patient will:  Reduce symptoms of: anxiety, depression, and stress   Increase knowledge and/or ability of: stress reduction   Demonstrate ability to: Increase healthy adjustment to current life  circumstances  Progress towards Goals: Ongoing  Interventions: Interventions utilized:  Supportive Reflection Standardized Assessments completed: Not Needed  Patient and/or Family Response: Patient agrees with treatment plan.   Assessment: Patient currently experiencing: ADHD, PTSD, Psychosocial stress.   Patient may benefit from continued therapeutic intervention   Plan: Follow up with behavioral health clinician on : Two weeks Behavioral recommendations:  -Continue daily self-coping strategies and careful consideration of next life steps, as discussed Referral(s): Integrated [employer] (In Clinic)  I discussed the assessment and treatment plan with the patient and/or parent/guardian. They were provided an opportunity to ask questions and all were answered. They agreed with the plan and demonstrated an understanding of the instructions.   They were advised to call back or seek an in-person evaluation if the symptoms worsen or if the condition fails to improve as anticipated.  [name] Ahlana Slaydon, LCSW

## 2023-04-05 ENCOUNTER — Ambulatory Visit (HOSPITAL_BASED_OUTPATIENT_CLINIC_OR_DEPARTMENT_OTHER): Payer: Medicaid Other | Admitting: Physical Therapy

## 2023-04-08 ENCOUNTER — Other Ambulatory Visit: Payer: Self-pay | Admitting: Gastroenterology

## 2023-04-10 NOTE — Telephone Encounter (Signed)
Needs appointment

## 2023-04-11 ENCOUNTER — Ambulatory Visit (HOSPITAL_BASED_OUTPATIENT_CLINIC_OR_DEPARTMENT_OTHER): Payer: Medicaid Other | Admitting: Physical Therapy

## 2023-04-11 ENCOUNTER — Ambulatory Visit (INDEPENDENT_AMBULATORY_CARE_PROVIDER_SITE_OTHER): Payer: Medicaid Other | Admitting: *Deleted

## 2023-04-11 ENCOUNTER — Encounter (HOSPITAL_BASED_OUTPATIENT_CLINIC_OR_DEPARTMENT_OTHER): Payer: Self-pay | Admitting: Physical Therapy

## 2023-04-11 DIAGNOSIS — M25562 Pain in left knee: Secondary | ICD-10-CM

## 2023-04-11 DIAGNOSIS — R2689 Other abnormalities of gait and mobility: Secondary | ICD-10-CM | POA: Diagnosis not present

## 2023-04-11 DIAGNOSIS — M25662 Stiffness of left knee, not elsewhere classified: Secondary | ICD-10-CM

## 2023-04-11 DIAGNOSIS — M6281 Muscle weakness (generalized): Secondary | ICD-10-CM

## 2023-04-11 DIAGNOSIS — M25362 Other instability, left knee: Secondary | ICD-10-CM | POA: Diagnosis not present

## 2023-04-11 DIAGNOSIS — J309 Allergic rhinitis, unspecified: Secondary | ICD-10-CM

## 2023-04-11 NOTE — Therapy (Signed)
OUTPATIENT PHYSICAL THERAPY LOWER EXTREMITY EVALUATION   Patient Name: Rachel [name] MRN: [id number] DOB:20-Nov-1985, 37 y.o., female Today's Date: 04/11/2023  END OF SESSION:  PT End of Session - 04/11/23 0903     Visit Number 2    Authorization Type medicaid    PT Start Time 0902    PT Stop Time 0945    PT Time Calculation (min) 43 min    Activity Tolerance Patient tolerated treatment well    Behavior During Therapy [facility] for tasks assessed/performed             Past Medical History:  Diagnosis Date   ADHD (attention deficit hyperactivity disorder)    Anemia    Anxiety    Asthma    Bipolar disorder (HCC)    Complication of anesthesia    slow to wake up    Delayed gastric emptying 02/23/2020   Depression    DUB (dysfunctional uterine bleeding) 02/23/2020   Eczema    Ehlers-Danlos syndrome    Endometriosis    Gastroparesis    GERD (gastroesophageal reflux disease)    Heart murmur    Hiatal hernia 04/06/2020   Myalgia 04/06/2020   Nausea & vomiting 09/22/2022   Parasitic infection 02/23/2020   Pneumonia    Poor appetite 02/23/2020   Recurrent upper respiratory infection (URI)    RUQ pain 02/23/2020   Vitamin D deficiency    Weight loss 02/23/2020   Past Surgical History:  Procedure Laterality Date   barium study     COLONOSCOPY     CYSTOSCOPY N/A 07/12/2021   Procedure: CYSTOSCOPY;  Surgeon: Warden Fillers, MD;  Location: [facility] OR;  Service: Gynecology;  Laterality: N/A;   ESOPHAGOGASTRODUODENOSCOPY  2011   and a ph study as well.    LAPAROSCOPIC GASTRIC RESECTION N/A 01/22/2023   Procedure: LAPAROSCOPIC GASTRIC PEXY;  Surgeon: Quentin Ore, MD;  Location: [facility] ORS;  Service: General;  Laterality: N/A;   LAPAROSCOPY N/A 01/26/2021   Procedure: LAPAROSCOPY DIAGNOSTIC;  Surgeon: Warden Fillers, MD;  Location: [facility];  Service: Gynecology;  Laterality: N/A;   NASAL SEPTUM SURGERY     SINOSCOPY     TONGUE FLAP     TOTAL  LAPAROSCOPIC HYSTERECTOMY WITH SALPINGECTOMY Bilateral 07/12/2021   Procedure: TOTAL LAPAROSCOPIC HYSTERECTOMY WITH SALPINGECTOMY, OOPHORECTOMY;  Surgeon: Warden Fillers, MD;  Location: [facility] OR;  Service: Gynecology;  Laterality: Bilateral;   UPPER GASTROINTESTINAL ENDOSCOPY     WISDOM TOOTH EXTRACTION     Patient Active Problem List   Diagnosis Date Noted   Chronic migraine w/o aura w/o status migrainosus, not intractable 02/23/2023   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   Memory loss 12/25/2022   Paresthesia 12/25/2022   Other allergic rhinitis 12/04/2022   Allergic conjunctivitis of both eyes 12/04/2022   Mild intermittent asthma without complication 12/04/2022   Other atopic dermatitis 12/04/2022   Allergic reaction 12/04/2022   Rash and other nonspecific skin eruption 12/04/2022   Multiple drug allergies 12/04/2022   Other adverse food reactions, not elsewhere classified, subsequent encounter 12/04/2022   Abnormal CT of the abdomen 09/23/2022   Abdominal pain 09/22/2022   Asthma, chronic 09/22/2022   HLD (hyperlipidemia) 09/22/2022   Nausea & vomiting 09/22/2022   Iron deficiency anemia 01/20/2022   Acute hyponatremia 01/18/2022   Pyelonephritis 01/18/2022   Protein-calorie malnutrition, severe 01/18/2022   sepsis secondary to pyelonephritis of left kidney 01/17/2022   Sepsis (HCC) 01/17/2022   Chronic abdominal pain 01/17/2022   Dyspnea 01/17/2022  Underweight 01/17/2022   Irritable bowel syndrome (IBS) 01/17/2022   Hypokalemia 01/17/2022   Vasomotor symptoms due to menopause 08/17/2021   S/P laparoscopic hysterectomy 07/12/2021   Preoperative exam for gynecologic surgery 06/20/2021   Draining postoperative wound 02/28/2021   Encounter for postoperative care 02/16/2021   Endometriosis determined by laparoscopy    Pelvic pain in female 10/11/2020   Presence of subdermal contraceptive implant 10/11/2020   Dysmenorrhea 10/11/2020   Dyspareunia in female 10/11/2020   NSVT  (nonsustained ventricular tachycardia) (HCC) 09/03/2020   Gastroparesis 08/18/2020   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   Palpitations 07/26/2020   Family history of connective tissue disease 07/26/2020   Myalgia 04/06/2020   Hiatal hernia 04/06/2020   Recurrent infections 02/23/2020   Weight loss 02/23/2020   Poor appetite 02/23/2020   RUQ pain 02/23/2020   Delayed gastric emptying 02/23/2020   DUB (dysfunctional uterine bleeding) 02/23/2020   Cervical radiculopathy 05/31/2018   Hyperalgesia 05/31/2018   Muscle weakness 03/13/2018   GERD (gastroesophageal reflux disease) 12/26/2017   ADHD (attention deficit hyperactivity disorder) 05/07/2012   Bipolar 1 disorder (HCC) 05/07/2012    PCP: Dr Hoy Register   REFERRING PROVIDER: Hazle Nordmann PA   REFERRING DIAG:  Diagnosis  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 Yes: NPRS scale: 7/10 today 6/10  Pain location: medial knee and deep inside Pain description: aching  Aggravating factors: Sitting is the worst; standing is slightly better but gets worse  Relieving factors: good weather; elevating   PRECAUTIONS: None  WEIGHT BEARING RESTRICTIONS: No  FALLS:  Has patient fallen in last 6 months? No close to falls at times   LIVING ENVIRONMENT: Steps inside and outside   OCCUPATION:  Unable to work   Hobbies: Careers information officer sports  Traveling  Music       PLOF: Independent  PATIENT GOALS: to build strength and to walk better   NEXT MD VISIT:   OBJECTIVE:   DIAGNOSTIC FINDINGS: MRI: (-) for acute injury   PATIENT SURVEYS:  LEFS  17  COGNITION: Overall cognitive status: Within functional limits for tasks assessed     SENSATION: Reports numbness and tingling in her left hand at times  EDEMA:  MUSCLE LENGTH: POSTURE: No Significant postural limitations  PALPATION: Tender to palpation in medial knee  LOWER EXTREMITY ROM:  Active ROM Right eval Left eval  Hip flexion    Hip extension    Hip abduction    Hip adduction    Hip internal rotation    Hip external rotation    Knee flexion  40 with significant pain   Knee extension  0  Ankle dorsiflexion    Ankle plantarflexion    Ankle inversion    Ankle eversion     (Blank rows = not tested)  LOWER EXTREMITY MMT:  MMT Right eval Left eval  Hip flexion 13.9 <5   Hip extension    Hip abduction 15.4 <5  Hip adduction    Hip internal rotation    Hip external rotation    Knee flexion    Knee extension 17.5 <5  Ankle dorsiflexion    Ankle plantarflexion    Ankle  inversion    Ankle eversion     (Blank rows = not tested)  LOWER EXTREMITY SPECIAL TESTS:   FUNCTIONAL TESTS:  Unable to squat and bend knee   GAIT: Ambulates with stiff left knee, decreased weightbearing on left side, increased hip circumduction on left side.  Patient initially ambulating with cane on left side.  She was shifting more weight onto the left leg.  Therapy adjusted came to the right side.  Patient had improved gait pattern with cane on the right side.  TODAY'S TREATMENT:                                                                                                                              Pt seen for aquatic therapy today.  Treatment took place in water 3.5-4.75 ft in depth at the [facility] pool. Temp of water was 91.  Pt entered/exited the pool via stair using step to pattern with bilat hand rail.  *walking supported by noodle 4.58ft forward *cycling on noodle; hip add/abd *step ups bottom step x 5 leading R/L (cues for flexing left knee) *Seated on  lift: LAQ 2 x 10; ankle df/pf *Standing ue support on wall: df x5; pfx5; hip extension x 10; relaxed mini-squats *Gait forward: cues for heel strike, increased stance time left.  Pt requires the buoyancy and hydrostatic pressure of water for support, and to offload joints by unweighting joint load by at least 50 % in navel deep water and by at least 75-80% in chest to neck deep water.  Viscosity of the water is needed for resistance of strengthening. Water current perturbations provides challenge to standing balance requiring increased core activation.     Exercises - Supine Quad Set  - 5 x daily - 7 x weekly - 1 sets - 5 reps - 2-3sec  hold - Supine Heel Slide with Strap  - 1 x daily - 7 x weekly - 1 sets - 3 reps - 20 se hold  PATIENT EDUCATION:  Education details: HEP; symptom management; benefits of aquatics  Person educated: Patient Education method: Explanation, Demonstration, Tactile cues, Verbal cues, and Handouts Education comprehension: verbalized understanding, returned demonstration, verbal cues required, tactile cues required, and needs further education  HOME EXERCISE PROGRAM: Access Code: [email or web address] URL: [email or web address] Date: 04/04/2023 Prepared by: Lorayne Bender   Added Aquatics: This aquatic home exercise program from Med[employer] utilizes pictures from land based exercises, but has been adapted prior to lamination and issuance.  Access Code: [email or web address] URL: [email or web address] Date: 04/11/2023 Prepared by: Geni Bers  ASSESSMENT:  CLINICAL IMPRESSION:  Pt familiar to setting and safe and indep submerged with therapist instructing from deck.  Gait initiated in dep water to limit wb. Pt moves are guarded and she is reluctant to flex left knee.  Cues improve execution of exercise and gait pattern (submerged).  Focused on gentle ROM to encourage knee flex with Wbing as tolerated.      Initial clinical impression  Patient is a  37 year old female presents to physical therapy with an acute left knee injury on February 17, 2023.  She reported a dislocation with relocation of her patella.  MRI reveals no structural damage to her knee.  She presents with a significant flexion movement limitation at this time.  She reports difficulty performing any activity that involves bending her knee.  Her strength measurements measured less than 5 pounds with all major muscle groups on the left side.  Left knee extension not measured but is soon to be less than 5 pounds secondary to difficulty moving it against gravity.  She has been using a cane for ambulation.  Therapy moved her cane to the right side to allow for a better gait pattern.  She has had success with pool therapy in the past.  She would benefit from aquatic therapy to improve her ability to weight-bear and bend her left knee.  We will continue to progress her exercises to improve her general functional mobility.  OBJECTIVE IMPAIRMENTS: Abnormal gait, decreased activity tolerance, decreased knowledge of use of DME, difficulty walking, decreased ROM, decreased strength, postural dysfunction, and pain.   ACTIVITY LIMITATIONS: carrying, lifting, bending, sitting, standing, squatting, stairs, transfers, bed mobility, dressing, and locomotion level  PARTICIPATION LIMITATIONS: meal prep, cleaning, laundry, driving, shopping, occupation, yard work, and school  PERSONAL FACTORS: 3+ comorbidities: anxiety; gastroperiesis, EDH,   are also affecting patient's functional outcome.   REHAB POTENTIAL: Good  CLINICAL DECISION MAKING: Unstable/unpredictablesignificantly limited ability to bend knee with progressive loss of general mobility  EVALUATION COMPLEXITY: High   GOALS: Goals reviewed with patient? Yes  SHORT TERM GOALS: Target date: 05/09/2023   Patient will increase passive knee flexion to 80 degrees Baseline: Goal status: INITIAL  2.  Patient will get on and off the plinth  without having to use hands to help her left leg Baseline:  Goal status: INITIAL  3.  Patient will be independent with basic land and water HEP Baseline:  Goal status: INITIAL  4.  Patient will transfer sit to stand without significant increase in pain Baseline:  Goal status: INITIAL  5.  Patient will demonstrate greater than 5 pounds of strength in left lower extremity Baseline:  Goal status: INITIAL   LONG TERM GOALS: Target date: 06/13/2023    Patient will stand for 20 minutes without increased pain or to perform ADLs Baseline:  Goal status: INITIAL  2.  Patient will go up and down 8 steps without increased pain in order to improve her ability to get in and out of her house Baseline:  Goal status: INITIAL  3.  Patient will squat to pick item off the floor in order to perform ADLs without increased left knee pain Baseline:  Goal status: INITIAL  PLAN:  PT FREQUENCY: 2x/week  PT DURATION: 10 weeks  PLANNED INTERVENTIONS: Therapeutic exercises, Therapeutic activity, Neuromuscular re-education, Balance training, Gait training, Patient/Family education, Self Care, Joint mobilization, Stair training, DME instructions, Aquatic Therapy, Dry Needling, Electrical stimulation, Cryotherapy, Moist heat, Ultrasound, and Manual therapy  PLAN FOR NEXT SESSION: Aquatics: Begin light weightbearing and knee flexion in the water.  Progress weightbearing as tolerated. Land; continue to work on progressing patient's ability to bend her own knee.  When able to bend knee better consider NuStep.  Continue with progressive strengthening of left quad and hip.  Next visit consider standing weight shifting.  Continue with gait training.  Check all possible CPT codes: 16109 - PT Re-evaluation, 97110- Therapeutic Exercise, [phone]- Neuro Re-education, [phone] -  Gait Training, 16109 - Manual Therapy, [id number] - Therapeutic Activities, [phone] - Self Care, [phone] - Electrical stimulation (Manual), [id number] -  Ultrasound, and [id number] - Aquatic therapy    Check all conditions that are expected to impact treatment: {Conditions expected to impact treatment:Musculoskeletal disorders   If treatment provided at initial evaluation, no treatment charged due to lack of authorization.      Resa Rinks Tomma Lightning) Jonmarc Bodkin MPT 04/11/2023, 9:19 AM

## 2023-04-13 ENCOUNTER — Ambulatory Visit (HOSPITAL_BASED_OUTPATIENT_CLINIC_OR_DEPARTMENT_OTHER): Payer: Medicaid Other | Admitting: Physical Therapy

## 2023-04-16 ENCOUNTER — Ambulatory Visit (HOSPITAL_BASED_OUTPATIENT_CLINIC_OR_DEPARTMENT_OTHER): Payer: Medicaid Other | Attending: Student | Admitting: Physical Therapy

## 2023-04-16 ENCOUNTER — Encounter (HOSPITAL_BASED_OUTPATIENT_CLINIC_OR_DEPARTMENT_OTHER): Payer: Self-pay | Admitting: Physical Therapy

## 2023-04-16 DIAGNOSIS — M25562 Pain in left knee: Secondary | ICD-10-CM | POA: Diagnosis not present

## 2023-04-16 DIAGNOSIS — M25662 Stiffness of left knee, not elsewhere classified: Secondary | ICD-10-CM | POA: Diagnosis not present

## 2023-04-16 DIAGNOSIS — R2689 Other abnormalities of gait and mobility: Secondary | ICD-10-CM | POA: Insufficient documentation

## 2023-04-16 DIAGNOSIS — M6281 Muscle weakness (generalized): Secondary | ICD-10-CM | POA: Insufficient documentation

## 2023-04-16 NOTE — Therapy (Signed)
OUTPATIENT PHYSICAL THERAPY LOWER EXTREMITY EVALUATION   Patient Name: Rachel [name] MRN: [id number] DOB:1986-09-29, 37 y.o., female Today's Date: 04/16/2023  END OF SESSION:  PT End of Session - 04/16/23 1326     Visit Number 3    Number of Visits 20    Date for PT Re-Evaluation 06/13/23    Authorization Type medicaid    PT Start Time 1330    Activity Tolerance Patient tolerated treatment well    Behavior During Therapy [facility] for tasks assessed/performed             Past Medical History:  Diagnosis Date   ADHD (attention deficit hyperactivity disorder)    Anemia    Anxiety    Asthma    Bipolar disorder (HCC)    Complication of anesthesia    slow to wake up    Delayed gastric emptying 02/23/2020   Depression    DUB (dysfunctional uterine bleeding) 02/23/2020   Eczema    Ehlers-Danlos syndrome    Endometriosis    Gastroparesis    GERD (gastroesophageal reflux disease)    Heart murmur    Hiatal hernia 04/06/2020   Myalgia 04/06/2020   Nausea & vomiting 09/22/2022   Parasitic infection 02/23/2020   Pneumonia    Poor appetite 02/23/2020   Recurrent upper respiratory infection (URI)    RUQ pain 02/23/2020   Vitamin D deficiency    Weight loss 02/23/2020   Past Surgical History:  Procedure Laterality Date   barium study     COLONOSCOPY     CYSTOSCOPY N/A 07/12/2021   Procedure: CYSTOSCOPY;  Surgeon: Warden Fillers, MD;  Location: [facility] OR;  Service: Gynecology;  Laterality: N/A;   ESOPHAGOGASTRODUODENOSCOPY  2011   and a ph study as well.    LAPAROSCOPIC GASTRIC RESECTION N/A 01/22/2023   Procedure: LAPAROSCOPIC GASTRIC PEXY;  Surgeon: Quentin Ore, MD;  Location: [facility] ORS;  Service: General;  Laterality: N/A;   LAPAROSCOPY N/A 01/26/2021   Procedure: LAPAROSCOPY DIAGNOSTIC;  Surgeon: Warden Fillers, MD;  Location: [facility];  Service: Gynecology;  Laterality: N/A;   NASAL SEPTUM SURGERY     SINOSCOPY     TONGUE FLAP      TOTAL LAPAROSCOPIC HYSTERECTOMY WITH SALPINGECTOMY Bilateral 07/12/2021   Procedure: TOTAL LAPAROSCOPIC HYSTERECTOMY WITH SALPINGECTOMY, OOPHORECTOMY;  Surgeon: Warden Fillers, MD;  Location: [facility] OR;  Service: Gynecology;  Laterality: Bilateral;   UPPER GASTROINTESTINAL ENDOSCOPY     WISDOM TOOTH EXTRACTION     Patient Active Problem List   Diagnosis Date Noted   Chronic migraine w/o aura w/o status migrainosus, not intractable 02/23/2023   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   Memory loss 12/25/2022   Paresthesia 12/25/2022   Other allergic rhinitis 12/04/2022   Allergic conjunctivitis of both eyes 12/04/2022   Mild intermittent asthma without complication 12/04/2022   Other atopic dermatitis 12/04/2022   Allergic reaction 12/04/2022   Rash and other nonspecific skin eruption 12/04/2022   Multiple drug allergies 12/04/2022   Other adverse food reactions, not elsewhere classified, subsequent encounter 12/04/2022   Abnormal CT of the abdomen 09/23/2022   Abdominal pain 09/22/2022   Asthma, chronic 09/22/2022   HLD (hyperlipidemia) 09/22/2022   Nausea & vomiting 09/22/2022   Iron deficiency anemia 01/20/2022   Acute hyponatremia 01/18/2022   Pyelonephritis 01/18/2022   Protein-calorie malnutrition, severe 01/18/2022   sepsis secondary to pyelonephritis of left kidney 01/17/2022   Sepsis (HCC) 01/17/2022   Chronic abdominal pain 01/17/2022   Dyspnea 01/17/2022  Underweight 01/17/2022   Irritable bowel syndrome (IBS) 01/17/2022   Hypokalemia 01/17/2022   Vasomotor symptoms due to menopause 08/17/2021   S/P laparoscopic hysterectomy 07/12/2021   Preoperative exam for gynecologic surgery 06/20/2021   Draining postoperative wound 02/28/2021   Encounter for postoperative care 02/16/2021   Endometriosis determined by laparoscopy    Pelvic pain in female 10/11/2020   Presence of subdermal contraceptive implant 10/11/2020   Dysmenorrhea 10/11/2020   Dyspareunia in female 10/11/2020   NSVT  (nonsustained ventricular tachycardia) (HCC) 09/03/2020   Gastroparesis 08/18/2020   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   Palpitations 07/26/2020   Family history of connective tissue disease 07/26/2020   Myalgia 04/06/2020   Hiatal hernia 04/06/2020   Recurrent infections 02/23/2020   Weight loss 02/23/2020   Poor appetite 02/23/2020   RUQ pain 02/23/2020   Delayed gastric emptying 02/23/2020   DUB (dysfunctional uterine bleeding) 02/23/2020   Cervical radiculopathy 05/31/2018   Hyperalgesia 05/31/2018   Muscle weakness 03/13/2018   GERD (gastroesophageal reflux disease) 12/26/2017   ADHD (attention deficit hyperactivity disorder) 05/07/2012   Bipolar 1 disorder (HCC) 05/07/2012    PCP: Dr Hoy Register   REFERRING PROVIDER: Hazle Nordmann PA   REFERRING DIAG:  Diagnosis  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 Yes: NPRS scale: 7/10  Pain location: medial knee and deep inside Pain description: aching  Aggravating factors: Sitting is the worst; standing is slightly better but gets worse  Relieving factors: good weather; elevating   PRECAUTIONS: None  WEIGHT BEARING RESTRICTIONS: No  FALLS:  Has patient fallen in last 6 months? No close to falls at times   LIVING ENVIRONMENT: Steps inside and outside   OCCUPATION:  Unable to work   Hobbies: Careers information officer sports  Traveling  Music       PLOF: Independent  PATIENT GOALS: to build strength and to walk better   NEXT MD VISIT:   OBJECTIVE:   DIAGNOSTIC  FINDINGS: MRI: (-) for acute injury   PATIENT SURVEYS:  LEFS 17  COGNITION: Overall cognitive status: Within functional limits for tasks assessed     SENSATION: Reports numbness and tingling in her left hand at times  EDEMA:  MUSCLE LENGTH: POSTURE: No Significant postural limitations  PALPATION: Tender to palpation in medial knee  LOWER EXTREMITY ROM:  Active ROM Right eval Left eval  Hip flexion    Hip extension    Hip abduction    Hip adduction    Hip internal rotation    Hip external rotation    Knee flexion  40 with significant pain   Knee extension  0  Ankle dorsiflexion    Ankle plantarflexion    Ankle inversion    Ankle eversion     (Blank rows = not tested)  LOWER EXTREMITY MMT:  MMT Right eval Left eval  Hip flexion 13.9 <5   Hip extension    Hip abduction 15.4 <5  Hip adduction    Hip internal rotation    Hip external rotation    Knee flexion    Knee extension 17.5 <  5  Ankle dorsiflexion    Ankle plantarflexion    Ankle inversion    Ankle eversion     (Blank rows = not tested)  LOWER EXTREMITY SPECIAL TESTS:   FUNCTIONAL TESTS:  Unable to squat and bend knee   GAIT: Ambulates with stiff left knee, decreased weightbearing on left side, increased hip circumduction on left side.  Patient initially ambulating with cane on left side.  She was shifting more weight onto the left leg.  Therapy adjusted came to the right side.  Patient had improved gait pattern with cane on the right side.  TODAY'S TREATMENT:                                                                                                                              Pt seen for aquatic therapy today.  Treatment took place in water 3.5-4.75 ft in depth at the [facility] pool. Temp of water was 91.  Pt entered/exited the pool via stair using step to pattern with bilat hand rail.  *walking supported by noodle 3.56ft - 4.0 forward/back.  Cues for proper gait pattern *cycling  on noodle; hip add/abd; skiing; LAQ  - LAQ 2 x 10; ankle df/pf  *Standing ue support on HB straddling noodle: relaxed squats   - ue on wall: df x5-8; pfx8; hip extension x 10; relaxed mini-squats *Sitting balance on thick yellow noodle: hands elevated then float pushed down *step ups water step in 4.1ft x 5-7 leading R/L    Pt requires the buoyancy and hydrostatic pressure of water for support, and to offload joints by unweighting joint load by at least 50 % in navel deep water and by at least 75-80% in chest to neck deep water.  Viscosity of the water is needed for resistance of strengthening. Water current perturbations provides challenge to standing balance requiring increased core activation.     Exercises - Supine Quad Set  - 5 x daily - 7 x weekly - 1 sets - 5 reps - 2-3sec  hold - Supine Heel Slide with Strap  - 1 x daily - 7 x weekly - 1 sets - 3 reps - 20 se hold  PATIENT EDUCATION:  Education details: HEP; symptom management; benefits of aquatics  Person educated: Patient Education method: Explanation, Demonstration, Tactile cues, Verbal cues, and Handouts Education comprehension: verbalized understanding, returned demonstration, verbal cues required, tactile cues required, and needs further education  HOME EXERCISE PROGRAM: Access Code: [email or web address] URL: [email or web address] Date: 04/04/2023 Prepared by: Lorayne Bender   Added Aquatics: This aquatic home exercise program from Med[employer] utilizes pictures from land based exercises, but has been adapted prior to lamination and issuance.  Access Code: [email or web address] URL: [email or web address] Date: 04/11/2023 Prepared by: Geni Bers  ASSESSMENT:  CLINICAL IMPRESSION:  Pt with improved toleration to step ups completed in 4.0.  Improved weight shift and engagement of hip, knee  and ankle biomechanics with task.  She continues to have difficulty due  to pain weight bearing through lle submerged in 3.6.  Tolerates majority of session with multiple rest periods.      Initial clinical impression Patient is a 37 year old female presents to physical therapy with an acute left knee injury on February 17, 2023.  She reported a dislocation with relocation of her patella.  MRI reveals no structural damage to her knee.  She presents with a significant flexion movement limitation at this time.  She reports difficulty performing any activity that involves bending her knee.  Her strength measurements measured less than 5 pounds with all major muscle groups on the left side.  Left knee extension not measured but is soon to be less than 5 pounds secondary to difficulty moving it against gravity.  She has been using a cane for ambulation.  Therapy moved her cane to the right side to allow for a better gait pattern.  She has had success with pool therapy in the past.  She would benefit from aquatic therapy to improve her ability to weight-bear and bend her left knee.  We will continue to progress her exercises to improve her general functional mobility.  OBJECTIVE IMPAIRMENTS: Abnormal gait, decreased activity tolerance, decreased knowledge of use of DME, difficulty walking, decreased ROM, decreased strength, postural dysfunction, and pain.   ACTIVITY LIMITATIONS: carrying, lifting, bending, sitting, standing, squatting, stairs, transfers, bed mobility, dressing, and locomotion level  PARTICIPATION LIMITATIONS: meal prep, cleaning, laundry, driving, shopping, occupation, yard work, and school  PERSONAL FACTORS: 3+ comorbidities: anxiety; gastroperiesis, EDH,   are also affecting patient's functional outcome.   REHAB POTENTIAL: Good  CLINICAL DECISION MAKING: Unstable/unpredictablesignificantly limited ability to bend knee with progressive loss of general mobility  EVALUATION COMPLEXITY: High   GOALS: Goals reviewed with patient? Yes  SHORT TERM GOALS: Target date: 05/09/2023   Patient will increase passive  knee flexion to 80 degrees Baseline: Goal status: INITIAL  2.  Patient will get on and off the plinth without having to use hands to help her left leg Baseline:  Goal status: INITIAL  3.  Patient will be independent with basic land and water HEP Baseline:  Goal status: INITIAL  4.  Patient will transfer sit to stand without significant increase in pain Baseline:  Goal status: INITIAL  5.  Patient will demonstrate greater than 5 pounds of strength in left lower extremity Baseline:  Goal status: INITIAL   LONG TERM GOALS: Target date: 06/13/2023    Patient will stand for 20 minutes without increased pain or to perform ADLs Baseline:  Goal status: INITIAL  2.  Patient will go up and down 8 steps without increased pain in order to improve her ability to get in and out of her house Baseline:  Goal status: INITIAL  3.  Patient will squat to pick item off the floor in order to perform ADLs without increased left knee pain Baseline:  Goal status: INITIAL  PLAN:  PT FREQUENCY: 2x/week  PT DURATION: 10 weeks  PLANNED INTERVENTIONS: Therapeutic exercises, Therapeutic activity, Neuromuscular re-education, Balance training, Gait training, Patient/Family education, Self Care, Joint mobilization, Stair training, DME instructions, Aquatic Therapy, Dry Needling, Electrical stimulation, Cryotherapy, Moist heat, Ultrasound, and Manual therapy  PLAN FOR NEXT SESSION: Aquatics: Begin light weightbearing and knee flexion in the water.  Progress weightbearing as tolerated. Land; continue to work on progressing patient's ability to bend her own knee.  When able to bend knee better consider NuStep.  Continue with progressive strengthening of left quad and hip.  Next visit consider  standing weight shifting.  Continue with gait training.  Check all possible CPT codes: 69629 - PT Re-evaluation, 97110- Therapeutic Exercise, [phone]- Neuro Re-education, [phone] - Gait Training, [phone] - Manual Therapy,  [phone] - Therapeutic Activities, [phone] - Self Care, [phone] - Electrical stimulation (Manual), [id number] - Ultrasound, and [id number] - Aquatic therapy    Check all conditions that are expected to impact treatment: {Conditions expected to impact treatment:Musculoskeletal disorders   If treatment provided at initial evaluation, no treatment charged due to lack of authorization.      Woodroe Vogan (Frankie) Rosalynd Mcwright MPT 04/16/2023, 1:27 PM

## 2023-04-17 ENCOUNTER — Ambulatory Visit (INDEPENDENT_AMBULATORY_CARE_PROVIDER_SITE_OTHER): Payer: Medicaid Other | Admitting: Clinical

## 2023-04-17 DIAGNOSIS — F909 Attention-deficit hyperactivity disorder, unspecified type: Secondary | ICD-10-CM

## 2023-04-17 DIAGNOSIS — F4310 Post-traumatic stress disorder, unspecified: Secondary | ICD-10-CM

## 2023-04-17 DIAGNOSIS — R339 Retention of urine, unspecified: Secondary | ICD-10-CM | POA: Diagnosis not present

## 2023-04-17 DIAGNOSIS — Z658 Other specified problems related to psychosocial circumstances: Secondary | ICD-10-CM

## 2023-04-18 ENCOUNTER — Ambulatory Visit: Payer: Medicaid Other | Admitting: Family Medicine

## 2023-04-18 ENCOUNTER — Ambulatory Visit (INDEPENDENT_AMBULATORY_CARE_PROVIDER_SITE_OTHER): Payer: Medicaid Other | Admitting: *Deleted

## 2023-04-18 DIAGNOSIS — J309 Allergic rhinitis, unspecified: Secondary | ICD-10-CM

## 2023-04-23 ENCOUNTER — Encounter: Payer: Self-pay | Admitting: Family Medicine

## 2023-04-23 ENCOUNTER — Other Ambulatory Visit: Payer: Self-pay | Admitting: Family Medicine

## 2023-04-23 ENCOUNTER — Ambulatory Visit: Payer: Medicaid Other | Attending: Family Medicine | Admitting: Family Medicine

## 2023-04-23 VITALS — BP 109/74 | HR 54 | Wt 110.8 lb

## 2023-04-23 DIAGNOSIS — K3184 Gastroparesis: Secondary | ICD-10-CM

## 2023-04-23 DIAGNOSIS — M2550 Pain in unspecified joint: Secondary | ICD-10-CM | POA: Diagnosis not present

## 2023-04-23 DIAGNOSIS — I700 Atherosclerosis of aorta: Secondary | ICD-10-CM | POA: Diagnosis not present

## 2023-04-23 DIAGNOSIS — L309 Dermatitis, unspecified: Secondary | ICD-10-CM

## 2023-04-23 DIAGNOSIS — F909 Attention-deficit hyperactivity disorder, unspecified type: Secondary | ICD-10-CM | POA: Diagnosis not present

## 2023-04-23 DIAGNOSIS — Z131 Encounter for screening for diabetes mellitus: Secondary | ICD-10-CM | POA: Diagnosis not present

## 2023-04-23 DIAGNOSIS — J3089 Other allergic rhinitis: Secondary | ICD-10-CM

## 2023-04-23 DIAGNOSIS — M791 Myalgia, unspecified site: Secondary | ICD-10-CM

## 2023-04-23 MED ORDER — MISC. DEVICES MISC: 0 refills | Status: DC

## 2023-04-23 MED ORDER — ATORVASTATIN CALCIUM 20 MG PO TABS: 20.0000 mg | ORAL_TABLET | Freq: Every day | ORAL | 1 refills | Status: DC

## 2023-04-23 MED ORDER — TRIAMCINOLONE ACETONIDE 0.1 % EX CREA: 1.0000 | TOPICAL_CREAM | Freq: Two times a day (BID) | CUTANEOUS | 1 refills | Status: DC | PRN

## 2023-04-23 NOTE — Patient Instructions (Signed)
Joint Pain Joint pain may be caused by many things. Joint pain is likely to go away when you follow instructions from your health care provider for relieving pain at home. However, joint pain can also be caused by conditions that require more treatment. Common causes of joint pain include: Bruising in the area of the joint. Injury caused by repeating certain movements too many times. Age-related joint wear and tear. Buildup of uric acid crystals in the joint (gout). Inflammation of the joint. Other forms of arthritis. Infections of the joint or of the bone. Your health care provider may recommend that you take pain medicine or wear a supportive device like an elastic bandage, sling, or splint. If your joint pain continues, you may need lab or imaging tests to diagnose the cause of your joint pain. Follow these instructions at home: Managing pain, stiffness, and swelling     If directed, put ice on the painful area. To do this: If you have a removable elastic bandage, sling, or splint, take it off as told by your doctor. Put ice in a plastic bag. Place a towel between your skin and the bag. Leave the ice on for 20 minutes, 2-3 times a day. Remove the ice if your skin turns bright red. This is very important. If you cannot feel pain, heat, or cold, you have a greater risk of damage to the area. Move your fingers and toes often to reduce stiffness and swelling. Raise the injured area above the level of your heart while you are sitting or lying down. If directed, apply heat to the painful area as often as told by your health care provider. Use the heat source that your health care provider recommends, such as a moist heat pack or a heating pad. Place a towel between your skin and the heat source. Leave the heat on for 20-30 minutes. Remove the heat if your skin turns bright red. This is especially important if you are unable to feel pain, heat, or cold. You have a greater risk of getting  burned.  Activity Rest as told by your health care provider. Do not do anything that causes or worsens pain. Begin exercising or stretching the affected area as told by your health care provider. Return to your normal activities as told by your health care provider. Ask your health care provider what activities are safe for you. If you have an elastic bandage, sling, or splint: Wear the bandage, sling, or splint as told by your health care provider. Remove it only as told by your health care provider. Loosen it if your fingers or toes below the joint tingle, become numb, or turn cold and blue. Keep it clean. Ask your health care provider if you should remove it before bathing. If the bandage, sling, or splint is not waterproof: Do not let it get wet. Cover it with a watertight covering when you take a bath or shower. General instructions Treatment may include medicines for pain and inflammation that are taken by mouth or applied to the skin. Take over-the-counter and prescription medicines only as told by your health care provider. Do not use any products that contain nicotine or tobacco, such as cigarettes, e-cigarettes, and chewing tobacco. If you need help quitting, ask your health care provider. Keep all follow-up visits. This is important. Contact a health care provider if: You have pain that gets worse and does not get better with medicine. Your joint pain does not improve within 3 days. You have increased   bruising or swelling. You have a fever. You lose 10 lb (4.5 kg) or more without trying. Get help right away if: You cannot move the joint. Your fingers or toes tingle, become numb, or turn cold and blue. You have a fever along with a joint that is red, warm, and swollen. Summary Joint pain may be caused by many things. Your health care provider may recommend that you take pain medicine or wear a supportive device such as an elastic bandage, sling, or splint. If your joint pain  continues, you may need tests to diagnose the cause of your joint pain. Take over-the-counter and prescription medicines only as told by your health care provider. This information is not intended to replace advice given to you by your health care provider. Make sure you discuss any questions you have with your health care provider. Document Revised: 02/10/2020 Document Reviewed: 02/11/2020 Elsevier Patient Education  2024 [employer]  

## 2023-04-23 NOTE — Progress Notes (Signed)
Subjective:  Patient ID: [name], female    DOB: 12-Jul-1986  Age: 37 y.o. MRN: [id number]  CC: Pain   HPI Rachel [name] is a 37 y.o. year old female with a history of bipolar disorder, anxiety, depression, PTSD, GERD, IBS with constipation, Migraines, history of laparoscopic TAHBSO due to abnormal uterine bleed currently on Premarin by GYN) She underwent laparoscopic gastric pexy in 01/2023 secondary to presence of volvulus at [facility].  Interval History:  Her motility issues and gastroparesis are still present. Her surgery was beneficial as her 'gut is not twisting' anymore. She is still seeing Ortho due to 'dislocating her left hip'.  Undergoing aquatic therapy which has been beneficial but she does still experience pain in her joints with hypermobility.  She is requesting completion of a handicap form, shower bench and a cane. She is currently on immunotherapy which she received from allergy and immunology. Also undergoing counseling with the LCSW.  She is requesting a refill of triamcinolone cream which she uses for dermatitis. She has been on atorvastatin for hyperlipidemia and is tolerating this okay. She is currently doing well on Maxalt for management of her migraines Past Medical History:  Diagnosis Date   ADHD (attention deficit hyperactivity disorder)    Anemia    Anxiety    Asthma    Bipolar disorder (HCC)    Complication of anesthesia    slow to wake up    Delayed gastric emptying 02/23/2020   Depression    DUB (dysfunctional uterine bleeding) 02/23/2020   Eczema    Ehlers-Danlos syndrome    Endometriosis    Gastroparesis    GERD (gastroesophageal reflux disease)    Heart murmur    Hiatal hernia 04/06/2020   Myalgia 04/06/2020   Nausea & vomiting 09/22/2022   Parasitic infection 02/23/2020   Pneumonia    Poor appetite 02/23/2020   Recurrent upper respiratory infection (URI)    RUQ pain 02/23/2020   Vitamin  D deficiency    Weight loss 02/23/2020    Past Surgical History:  Procedure Laterality Date   barium study     COLONOSCOPY     CYSTOSCOPY N/A 07/12/2021   Procedure: CYSTOSCOPY;  Surgeon: Warden Fillers, MD;  Location: [facility] OR;  Service: Gynecology;  Laterality: N/A;   ESOPHAGOGASTRODUODENOSCOPY  2011   and a ph study as well.    LAPAROSCOPIC GASTRIC RESECTION N/A 01/22/2023   Procedure: LAPAROSCOPIC GASTRIC PEXY;  Surgeon: Quentin Ore, MD;  Location: [facility] ORS;  Service: General;  Laterality: N/A;   LAPAROSCOPY N/A 01/26/2021   Procedure: LAPAROSCOPY DIAGNOSTIC;  Surgeon: Warden Fillers, MD;  Location: [facility];  Service: Gynecology;  Laterality: N/A;   NASAL SEPTUM SURGERY     SINOSCOPY     TONGUE FLAP     TOTAL LAPAROSCOPIC HYSTERECTOMY WITH SALPINGECTOMY Bilateral 07/12/2021   Procedure: TOTAL LAPAROSCOPIC HYSTERECTOMY WITH SALPINGECTOMY, OOPHORECTOMY;  Surgeon: Warden Fillers, MD;  Location: [facility] OR;  Service: Gynecology;  Laterality: Bilateral;   UPPER GASTROINTESTINAL ENDOSCOPY     WISDOM TOOTH EXTRACTION      Family History  Problem Relation Age of Onset   Eczema Mother    Urticaria Mother    Allergic rhinitis Mother    Colon polyps Mother    Depression Mother    Anxiety disorder Mother    Hypertension Mother    Hyperlipidemia Mother    Asthma Mother    Endometriosis Mother    Fibroids Mother  Fibromyalgia Mother    Migraines Mother    Irritable bowel syndrome Mother    Allergic rhinitis Father    Depression Father    Hypertension Father    Hyperlipidemia Father    Bipolar disorder Father    Urticaria Sister    Allergic rhinitis Sister    Bipolar disorder Sister    ADD / ADHD Sister    Asthma Sister    Endometriosis Sister    Seizures Sister    Bipolar disorder Sister    Anxiety disorder Sister    Heart disease Maternal Grandmother    Endometriosis Maternal Grandmother    Fibroids Maternal Grandmother    Hyperlipidemia  Maternal Grandmother    Hypertension Maternal Grandmother    Diabetes Maternal Grandmother    Fibromyalgia Maternal Grandmother    Heart disease Maternal Grandfather    Diabetes Maternal Grandfather    Heart disease Paternal Grandmother    Heart disease Paternal Grandfather    Colon cancer Maternal Great-grandmother    Pancreatic cancer Neg Hx    Esophageal cancer Neg Hx    Stomach cancer Neg Hx     Social History   Socioeconomic History   Marital status: Divorced    Spouse name: Not on file   Number of children: Not on file   Years of education: Not on file   Highest education level: Not on file  Occupational History   Not on file  Tobacco Use   Smoking status: Never    Passive exposure: Never   Smokeless tobacco: Never  Vaping Use   Vaping Use: Never used  Substance and Sexual Activity   Alcohol use: Yes    Alcohol/week: 1.0 standard drink of alcohol    Types: 1 Glasses of wine per week    Comment: 1 glass of wine every other week   Drug use: No   Sexual activity: Not Currently  Other Topics Concern   Not on file  Social History Narrative   Not on file   Social Determinants of Health   Financial Resource Strain: Not on file  Food Insecurity: No Food Insecurity (01/22/2023)   Hunger Vital Sign    Worried About Running Out of Food in the Last Year: Never true    Ran Out of Food in the Last Year: Never true  Transportation Needs: No Transportation Needs (01/22/2023)   PRAPARE - Administrator, Civil Service (Medical): No    Lack of Transportation (Non-Medical): No  Physical Activity: Not on file  Stress: Not on file  Social Connections: Not on file    Allergies  Allergen Reactions   Amitriptyline Anaphylaxis   Amphetamine-Dextroamphetamine Other (See Comments)    Triggered bipolar, caused depression, and suicidal thoughts   Lupron [Leuprolide] Anaphylaxis   Orilissa [Elagolix] Anaphylaxis   Penicillins Anaphylaxis, Swelling and Other (See  Comments)    Potential for anaphylaxis confirmed. Tolerates Cefphalosporins No "-cillins"!!!! Did it involve swelling of the face/tongue/throat, SOB, or low BP? Yes Did it involve sudden or severe rash/hives, skin peeling, or any reaction on the inside of your mouth or nose? Unknown Did you need to seek medical attention at a hospital or doctor's office? Unknown When did it last happen? teenager      If all above answers are "NO", may proceed with cephalosporin use.   Wound Dressing Adhesive Rash   Betadine [Povidone-Iodine] Swelling and Rash   Chlorhexidine Hives    DuraPrep    Doxycycline Itching, Nausea And Vomiting and Other (  See Comments)    Caused high fever, also   Mixed Ragweed Other (See Comments)    Unknown, Mother and pt state she can't have    Olanzapine Other (See Comments)    Caused mania   Other Other (See Comments)    Stimulants, mood stabilizers, and antidepressants = Triggered bipolar, caused depression, and suicidal thoughts   Prozac [Fluoxetine Hcl] Other (See Comments)    Triggered bipolar, caused depression, and suicidal thoughts   Stevia Glycerite Extract [Flavoring Agent] Other (See Comments)    Unknown reaction; mother and pt confirm any Artificial Sweeteners cause reaction   Tape Other (See Comments)    Paper tape is tolerated   Triple Antibiotic Pain Relief [Neomy-Bacit-Polymyx-Pramoxine] Hives   Reglan [Metoclopramide] Anxiety and Other (See Comments)    Triggered bipolar, caused depression, suicidal thoughts, muscle twitching, stiffness, and high anxiety     Outpatient Medications Prior to Visit  Medication Sig Dispense Refill   acetaminophen (TYLENOL) 500 MG tablet Take 500-1,000 mg by mouth every 6 (six) hours as needed for moderate pain or mild pain.     albuterol (PROVENTIL) (2.5 MG/3ML) 0.083% nebulizer solution Take 3 mLs (2.5 mg total) by nebulization every 4 (four) hours as needed for wheezing or shortness of breath (coughing fits). 75 mL 1    albuterol (VENTOLIN HFA) 108 (90 Base) MCG/ACT inhaler Inhale 2 puffs into the lungs every 4 (four) hours as needed for wheezing or shortness of breath (coughing fits). 18 g 1   B Complex Vitamins (B COMPLEX PO) Take 1 capsule by mouth every evening.     Calcium-Magnesium-Vitamin D 600-40-500 MG-MG-UNIT TB24 Take 1 tablet by mouth every evening.     cromolyn (OPTICROM) 4 % ophthalmic solution Place 1 drop into both eyes 4 (four) times daily as needed (itchy/watery eyes). 10 mL 3   diphenhydrAMINE (BENADRYL) 25 MG tablet Take 25 mg by mouth every 6 (six) hours as needed for allergies or itching.     EPINEPHrine 0.3 mg/0.3 mL IJ SOAJ injection Inject 0.3 mg into the muscle as needed for anaphylaxis. 2 each 1   ferrous sulfate 325 (65 FE) MG tablet TAKE 1 TABLET BY MOUTH DAILY 60 tablet 2   fexofenadine (ALLEGRA) 180 MG tablet Take 180 mg by mouth every evening.     ibuprofen (ADVIL) 200 MG tablet Take 400-800 mg by mouth every 8 (eight) hours as needed (pain.).     ketorolac (ACULAR) 0.5 % ophthalmic solution Place 1 drop into the right eye 4 (four) times daily. 3 mL 0   LINZESS 290 MCG CAPS capsule TAKE 1 CAPSULE BY MOUTH DAILY BEFORE BREAKFAST 30 capsule 0   Melatonin 10 MG TABS Take 10 mg by mouth at bedtime.     methocarbamol (ROBAXIN-750) 750 MG tablet Take 1 tablet (750 mg total) by mouth 4 (four) times daily. 30 tablet 0   MIRALAX 17 GM/SCOOP powder Take 17-34 g by mouth daily. Mix 17 g of powder into water and drink once a day     Olopatadine HCl (PATADAY) 0.2 % SOLN Place 1-2 drops into both eyes 3 (three) times daily as needed (allergies eye).     Omega-3 Fatty Acids (FISH OIL) 1200 MG CAPS Take 1,200 mg by mouth daily.     oxyCODONE-acetaminophen (PERCOCET) 5-325 MG tablet Take 1 tablet by mouth every 4 (four) hours as needed for severe pain. 20 tablet 0   PREMARIN 0.625 MG tablet TAKE 1 TABLET BY MOUTH DAILY TAKE FOR 21 DAYS  THEN DO NOT TAKE FOR 7 DAYS 30 tablet 11   promethazine  (PHENERGAN) 12.5 MG tablet Take 1 tablet (12.5 mg total) by mouth every 8 (eight) hours as needed for nausea. 30 tablet 1   rizatriptan (MAXALT-MLT) 10 MG disintegrating tablet May repeat in 2 hours if needed 10 tablet 5   sennosides-docusate sodium (SENOKOT-S) 8.6-50 MG tablet Take 2-3 tablets by mouth daily as needed for constipation.     sulfacetamide (BLEPH-10) 10 % ophthalmic solution Place 1-2 drops into the right eye every 3 (three) hours. Use while awake (Patient taking differently: Place 1-2 drops into the right eye every 3 (three) hours as needed (eye irritation.). Use while awake) 5 mL 0   VITAMIN A PO Take 3,000 mcg by mouth every evening.     zinc sulfate 220 (50 Zn) MG capsule Take 220 mg by mouth every evening.     atorvastatin (LIPITOR) 20 MG tablet TAKE 1 TABLET BY MOUTH DAILY 90 tablet 1   triamcinolone cream (KENALOG) 0.1 % Apply 1 Application topically 2 (two) times daily as needed (rash, itching). 45 g 1   No facility-administered medications prior to visit.     ROS Review of Systems  Constitutional:  Positive for appetite change. Negative for activity change.  HENT:  Negative for sinus pressure and sore throat.   Respiratory:  Negative for chest tightness, shortness of breath and wheezing.   Cardiovascular:  Negative for chest pain and palpitations.  Gastrointestinal:  Negative for abdominal distention, abdominal pain and constipation.  Genitourinary: Negative.   Musculoskeletal:        See HPI  Psychiatric/Behavioral:  Negative for behavioral problems and dysphoric mood.     Objective:  BP 109/74   Pulse (!) 54   Wt 110 lb 12.8 oz (50.3 kg)   LMP  (LMP Unknown)   SpO2 99%   BMI 20.27 kg/m      04/23/2023    8:48 AM 03/19/2023   10:36 AM 02/23/2023    9:42 AM  BP/Weight  Systolic BP 109 116 114  Diastolic BP 74 74 78  Wt. (Lbs) 110.8 113.6 111  BMI 20.27 kg/m2 20.78 kg/m2 19.91 kg/m2      Physical Exam Constitutional:      Appearance: She is  well-developed.  Cardiovascular:     Rate and Rhythm: Normal rate.     Heart sounds: Normal heart sounds. No murmur heard. Pulmonary:     Effort: Pulmonary effort is normal.     Breath sounds: Normal breath sounds. No wheezing or rales.  Chest:     Chest wall: No tenderness.  Abdominal:     General: Bowel sounds are normal. There is no distension.     Palpations: Abdomen is soft. There is no mass.     Tenderness: There is no abdominal tenderness.  Musculoskeletal:     Right lower leg: No edema.     Left lower leg: No edema.     Comments: Ambulates with a cane.  Neurological:     Mental Status: She is alert and oriented to person, place, and time.  Psychiatric:        Mood and Affect: Mood normal.        Latest Ref Rng & Units 01/22/2023    8:50 PM 01/17/2023    9:39 AM 12/04/2022    3:58 PM  CMP  Glucose 70 - 99 mg/dL  74  88   BUN 6 - 20 mg/dL  10  12  Creatinine 0.44 - 1.00 mg/dL 1.61  0.96  0.45   Sodium 135 - 145 mmol/L  138  141   Potassium 3.5 - 5.1 mmol/L  3.4  4.4   Chloride 98 - 111 mmol/L  104  105   CO2 22 - 32 mmol/L  27  22   Calcium 8.9 - 10.3 mg/dL  9.4  9.9   Total Protein 6.0 - 8.5 g/dL   7.1   Total Bilirubin 0.0 - 1.2 mg/dL   <4.0   Alkaline Phos 44 - 121 IU/L   80   AST 0 - 40 IU/L   13   ALT 0 - 32 IU/L   7     Lipid Panel     Component Value Date/Time   CHOL 228 (H) 08/08/2022 0840   TRIG 120 08/08/2022 0840   HDL 75 08/08/2022 0840   LDLCALC 132 (H) 08/08/2022 0840    CBC    Component Value Date/Time   WBC 9.3 01/22/2023 2050   RBC 3.77 (L) 01/22/2023 2050   HGB 11.7 (L) 01/22/2023 2050   HGB 13.2 12/04/2022 1558   HCT 34.5 (L) 01/22/2023 2050   HCT 40.4 12/04/2022 1558   PLT 264 01/22/2023 2050   PLT 221 12/04/2022 1558   MCV 91.5 01/22/2023 2050   MCV 92 12/04/2022 1558   MCH 31.0 01/22/2023 2050   MCHC 33.9 01/22/2023 2050   RDW 12.9 01/22/2023 2050   RDW 12.5 12/04/2022 1558   LYMPHSABS 2.3 12/04/2022 1558   MONOABS 0.4  09/22/2022 1211   EOSABS 0.5 (H) 12/04/2022 1558   BASOSABS 0.1 12/04/2022 1558    No results found for: "HGBA1C"  Assessment & Plan:  1. Aortic atherosclerosis (HCC) Last lipid panel was elevated Will send of lipid panel again today Continue Lipitor Low-cholesterol diet - atorvastatin (LIPITOR) 20 MG tablet; Take 1 tablet (20 mg total) by mouth daily.  Dispense: 90 tablet; Refill: 1 - LP+Non-HDL Cholesterol  2. Dermatitis - triamcinolone cream (KENALOG) 0.1 %; Apply 1 Application topically 2 (two) times daily as needed (rash, itching).  Dispense: 45 g; Refill: 1  3. Myalgia Chronic myalgias Undergoing aquatic therapy  4. Other allergic rhinitis Stable Continue immunotherapy with allergy and immunology  5. Gastroparesis Stable Status post gastric pexy Volvulus has resolved Eat small frequent portions  6. Hypermobility arthralgia Have written prescriptions for shower bench, cane and completed handicap form for Currently under the care of sports medicine Patient attributes this to having Ehlers-Danlos syndrome Unfortunately we tried to get her into [facility] but were unsuccessful  7. Attention deficit hyperactivity disorder (ADHD), unspecified ADHD type Continue psychotherapy with LCSW  8. Screening for diabetes mellitus - Hemoglobin A1c    Meds ordered this encounter  Medications   Misc. Devices MISC    Sig: Shower chair Dx- Arthralgia    Dispense:  1 each    Refill:  0   Misc. Devices MISC    Sig: Cane. Dx - arthralgia    Dispense:  1 each    Refill:  0   atorvastatin (LIPITOR) 20 MG tablet    Sig: Take 1 tablet (20 mg total) by mouth daily.    Dispense:  90 tablet    Refill:  1   triamcinolone cream (KENALOG) 0.1 %    Sig: Apply 1 Application topically 2 (two) times daily as needed (rash, itching).    Dispense:  45 g    Refill:  1    Follow-up: Return  in about 6 months (around 10/23/2023) for Chronic medical conditions.       Hoy Register, MD, FAAFP. [facility] [place], [place] [phone]   04/23/2023, 9:47 AM

## 2023-04-24 ENCOUNTER — Ambulatory Visit (HOSPITAL_BASED_OUTPATIENT_CLINIC_OR_DEPARTMENT_OTHER): Payer: Medicaid Other | Admitting: Physical Therapy

## 2023-04-24 ENCOUNTER — Encounter: Payer: Self-pay | Admitting: Family Medicine

## 2023-04-24 ENCOUNTER — Encounter (HOSPITAL_BASED_OUTPATIENT_CLINIC_OR_DEPARTMENT_OTHER): Payer: Self-pay | Admitting: Physical Therapy

## 2023-04-24 DIAGNOSIS — M25562 Pain in left knee: Secondary | ICD-10-CM

## 2023-04-24 DIAGNOSIS — M6281 Muscle weakness (generalized): Secondary | ICD-10-CM | POA: Diagnosis not present

## 2023-04-24 DIAGNOSIS — R2689 Other abnormalities of gait and mobility: Secondary | ICD-10-CM | POA: Diagnosis not present

## 2023-04-24 DIAGNOSIS — M25662 Stiffness of left knee, not elsewhere classified: Secondary | ICD-10-CM | POA: Diagnosis not present

## 2023-04-24 LAB — LP+NON-HDL CHOLESTEROL
Cholesterol, Total: 197 mg/dL (ref 100–199)
HDL: 91 mg/dL (ref >39)
LDL Chol Calc (NIH): 93 mg/dL (ref 0–99)
Total Non-HDL-Chol (LDL+VLDL): 106 mg/dL (ref 0–129)
Triglycerides: 72 mg/dL (ref 0–149)
VLDL Cholesterol Cal: 13 mg/dL (ref 5–40)

## 2023-04-24 NOTE — Therapy (Signed)
OUTPATIENT PHYSICAL THERAPY LOWER EXTREMITY EVALUATION   Patient Name: Rachel [name] MRN: [id number] DOB:October 25, 1986, 37 y.o., female Today's Date: 04/24/2023  END OF SESSION:  PT End of Session - 04/24/23 1450     Visit Number 4    Number of Visits 20    Date for PT Re-Evaluation 06/13/23    Authorization Type medicaid    PT Start Time 1447    PT Stop Time 1526    PT Time Calculation (min) 39 min    Activity Tolerance Patient tolerated treatment well    Behavior During Therapy WFL for tasks assessed/performed             Past Medical History:  Diagnosis Date   ADHD (attention deficit hyperactivity disorder)    Anemia    Anxiety    Asthma    Bipolar disorder (HCC)    Complication of anesthesia    slow to wake up    Delayed gastric emptying 02/23/2020   Depression    DUB (dysfunctional uterine bleeding) 02/23/2020   Eczema    Ehlers-Danlos syndrome    Endometriosis    Gastroparesis    GERD (gastroesophageal reflux disease)    Heart murmur    Hiatal hernia 04/06/2020   Myalgia 04/06/2020   Nausea & vomiting 09/22/2022   Parasitic infection 02/23/2020   Pneumonia    Poor appetite 02/23/2020   Recurrent upper respiratory infection (URI)    RUQ pain 02/23/2020   Vitamin D deficiency    Weight loss 02/23/2020   Past Surgical History:  Procedure Laterality Date   barium study     COLONOSCOPY     CYSTOSCOPY N/A 07/12/2021   Procedure: CYSTOSCOPY;  Surgeon: Warden Fillers, MD;  Location: [facility] OR;  Service: Gynecology;  Laterality: N/A;   ESOPHAGOGASTRODUODENOSCOPY  2011   and a ph study as well.    LAPAROSCOPIC GASTRIC RESECTION N/A 01/22/2023   Procedure: LAPAROSCOPIC GASTRIC PEXY;  Surgeon: Quentin Ore, MD;  Location: [facility];  Service: General;  Laterality: N/A;   LAPAROSCOPY N/A 01/26/2021   Procedure: LAPAROSCOPY DIAGNOSTIC;  Surgeon: Warden Fillers, MD;  Location: [facility];  Service: Gynecology;  Laterality: N/A;    NASAL SEPTUM SURGERY     SINOSCOPY     TONGUE FLAP     TOTAL LAPAROSCOPIC HYSTERECTOMY WITH SALPINGECTOMY Bilateral 07/12/2021   Procedure: TOTAL LAPAROSCOPIC HYSTERECTOMY WITH SALPINGECTOMY, OOPHORECTOMY;  Surgeon: Warden Fillers, MD;  Location: [facility];  Service: Gynecology;  Laterality: Bilateral;   UPPER GASTROINTESTINAL ENDOSCOPY     WISDOM TOOTH EXTRACTION     Patient Active Problem List   Diagnosis Date Noted   Chronic migraine w/o aura w/o status migrainosus, not intractable 02/23/2023   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   Memory loss 12/25/2022   Paresthesia 12/25/2022   Other allergic rhinitis 12/04/2022   Allergic conjunctivitis of both eyes 12/04/2022   Mild intermittent asthma without complication 12/04/2022   Other atopic dermatitis 12/04/2022   Allergic reaction 12/04/2022   Rash and other nonspecific skin eruption 12/04/2022   Multiple drug allergies 12/04/2022   Other adverse food reactions, not elsewhere classified, subsequent encounter 12/04/2022   Abnormal CT of the abdomen 09/23/2022   Abdominal pain 09/22/2022   Asthma, chronic 09/22/2022   HLD (hyperlipidemia) 09/22/2022   Nausea & vomiting 09/22/2022   Iron deficiency anemia 01/20/2022   Acute hyponatremia 01/18/2022   Pyelonephritis 01/18/2022   Protein-calorie malnutrition, severe 01/18/2022   sepsis secondary to pyelonephritis of left kidney 01/17/2022  Sepsis (HCC) 01/17/2022   Chronic abdominal pain 01/17/2022   Dyspnea 01/17/2022   Underweight 01/17/2022   Irritable bowel syndrome (IBS) 01/17/2022   Hypokalemia 01/17/2022   Vasomotor symptoms due to menopause 08/17/2021   S/P laparoscopic hysterectomy 07/12/2021   Preoperative exam for gynecologic surgery 06/20/2021   Draining postoperative wound 02/28/2021   Encounter for postoperative care 02/16/2021   Endometriosis determined by laparoscopy    Pelvic pain in female 10/11/2020   Presence of subdermal contraceptive implant 10/11/2020    Dysmenorrhea 10/11/2020   Dyspareunia in female 10/11/2020   NSVT (nonsustained ventricular tachycardia) (HCC) 09/03/2020   Gastroparesis 08/18/2020   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   Palpitations 07/26/2020   Family history of connective tissue disease 07/26/2020   Myalgia 04/06/2020   Hiatal hernia 04/06/2020   Recurrent infections 02/23/2020   Weight loss 02/23/2020   Poor appetite 02/23/2020   RUQ pain 02/23/2020   Delayed gastric emptying 02/23/2020   DUB (dysfunctional uterine bleeding) 02/23/2020   Cervical radiculopathy 05/31/2018   Hyperalgesia 05/31/2018   Muscle weakness 03/13/2018   GERD (gastroesophageal reflux disease) 12/26/2017   ADHD (attention deficit hyperactivity disorder) 05/07/2012   Bipolar 1 disorder (HCC) 05/07/2012    PCP: Dr Hoy Register   REFERRING PROVIDER: Hazle Nordmann PA   REFERRING DIAG:  Diagnosis  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 Yes: NPRS scale: 7.5/10  Pain location: medial knee and deep inside Pain description: aching  Aggravating factors: Sitting is the worst; standing is slightly better but gets worse  Relieving factors: good weather; elevating   PRECAUTIONS: None  WEIGHT BEARING RESTRICTIONS: No  FALLS:  Has patient fallen in last 6 months? No close to falls at times   LIVING ENVIRONMENT: Steps inside and outside   OCCUPATION:  Unable to work   Hobbies: Careers information officer sports  Traveling  Music       PLOF: Independent  PATIENT GOALS: to build  strength and to walk better   NEXT MD VISIT:   OBJECTIVE:   DIAGNOSTIC FINDINGS: MRI: (-) for acute injury   PATIENT SURVEYS:  LEFS 17  COGNITION: Overall cognitive status: Within functional limits for tasks assessed     SENSATION: Reports numbness and tingling in her left hand at times  EDEMA:  MUSCLE LENGTH: POSTURE: No Significant postural limitations  PALPATION: Tender to palpation in medial knee  LOWER EXTREMITY ROM:  Active ROM Right eval Left eval  Hip flexion    Hip extension    Hip abduction    Hip adduction    Hip internal rotation    Hip external rotation    Knee flexion  40 with significant pain   Knee extension  0  Ankle dorsiflexion    Ankle plantarflexion    Ankle inversion    Ankle eversion     (Blank rows = not tested)  LOWER EXTREMITY MMT:  MMT Right eval Left eval  Hip flexion 13.9 <5   Hip extension    Hip abduction 15.4 <5  Hip adduction    Hip internal rotation  Hip external rotation    Knee flexion    Knee extension 17.5 <5  Ankle dorsiflexion    Ankle plantarflexion    Ankle inversion    Ankle eversion     (Blank rows = not tested)  LOWER EXTREMITY SPECIAL TESTS:   FUNCTIONAL TESTS:  Unable to squat and bend knee   GAIT: Ambulates with stiff left knee, decreased weightbearing on left side, increased hip circumduction on left side.  Patient initially ambulating with cane on left side.  She was shifting more weight onto the left leg.  Therapy adjusted came to the right side.  Patient had improved gait pattern with cane on the right side.  TODAY'S TREATMENT:                                                                                                                              Pt seen for aquatic therapy today.  Treatment took place in water 3.5-4.75 ft in depth at the [facility] pool. Temp of water was 91.  Pt entered/exited the pool via stair using step to pattern with bilat hand rail.  *walking supported  by noodle 3.37ft - 4.0 forward/back.  Cues for proper gait pattern *cycling on noodle; hip add/abd; skiing; LAQ *seated on step: sls/flutter kick 2x20 *step ups water step in 4.68ft x 5 leading R/L. Cues for eccentric and concentric control. TrA engagement: full blue noodle pull down x 10 ea wide stance then staggered *Sitting balance on thick yellow noodle: hands elevated then float pushed down  Pt requires the buoyancy and hydrostatic pressure of water for support, and to offload joints by unweighting joint load by at least 50 % in navel deep water and by at least 75-80% in chest to neck deep water.  Viscosity of the water is needed for resistance of strengthening. Water current perturbations provides challenge to standing balance requiring increased core activation.     Exercises - Supine Quad Set  - 5 x daily - 7 x weekly - 1 sets - 5 reps - 2-3sec  hold - Supine Heel Slide with Strap  - 1 x daily - 7 x weekly - 1 sets - 3 reps - 20 se hold  PATIENT EDUCATION:  Education details: HEP; symptom management; benefits of aquatics  Person educated: Patient Education method: Explanation, Demonstration, Tactile cues, Verbal cues, and Handouts Education comprehension: verbalized understanding, returned demonstration, verbal cues required, tactile cues required, and needs further education  HOME EXERCISE PROGRAM: Access Code: [email or web address] URL: [email or web address] Date: 04/04/2023 Prepared by: Lorayne Bender   Added Aquatics: This aquatic home exercise program from [employer] utilizes pictures from land based exercises, but has been adapted prior to lamination and issuance.  Access Code: [email or web address] URL: [email or web address] Date: 04/11/2023 Prepared by: Geni Bers  ASSESSMENT:  CLINICAL IMPRESSION:  Pt tolerates increased resistance with core engagement. Left le pain sensitivity high initially and remains high through session. Gait using cane on deck appears  less antalgic  with improved cadence.  Cues for heel strike given as she tends to walk on toes (LLE).    Initial clinical impression Patient is a 37 year old female presents to physical therapy with an acute left knee injury on February 17, 2023.  She reported a dislocation with relocation of her patella.  MRI reveals no structural damage to her knee.  She presents with a significant flexion movement limitation at this time.  She reports difficulty performing any activity that involves bending her knee.  Her strength measurements measured less than 5 pounds with all major muscle groups on the left side.  Left knee extension not measured but is soon to be less than 5 pounds secondary to difficulty moving it against gravity.  She has been using a cane for ambulation.  Therapy moved her cane to the right side to allow for a better gait pattern.  She has had success with pool therapy in the past.  She would benefit from aquatic therapy to improve her ability to weight-bear and bend her left knee.  We will continue to progress her exercises to improve her general functional mobility.  OBJECTIVE IMPAIRMENTS: Abnormal gait, decreased activity tolerance, decreased knowledge of use of DME, difficulty walking, decreased ROM, decreased strength, postural dysfunction, and pain.   ACTIVITY LIMITATIONS: carrying, lifting, bending, sitting, standing, squatting, stairs, transfers, bed mobility, dressing, and locomotion level  PARTICIPATION LIMITATIONS: meal prep, cleaning, laundry, driving, shopping, occupation, yard work, and school  PERSONAL FACTORS: 3+ comorbidities: anxiety; gastroperiesis, EDH,   are also affecting patient's functional outcome.   REHAB POTENTIAL: Good  CLINICAL DECISION MAKING: Unstable/unpredictablesignificantly limited ability to bend knee with progressive loss of general mobility  EVALUATION COMPLEXITY: High   GOALS: Goals reviewed with patient? Yes  SHORT TERM GOALS: Target date:  05/09/2023   Patient will increase passive knee flexion to 80 degrees Baseline: Goal status: INITIAL  2.  Patient will get on and off the plinth without having to use hands to help her left leg Baseline:  Goal status: INITIAL  3.  Patient will be independent with basic land and water HEP Baseline:  Goal status: INITIAL  4.  Patient will transfer sit to stand without significant increase in pain Baseline:  Goal status: INITIAL  5.  Patient will demonstrate greater than 5 pounds of strength in left lower extremity Baseline:  Goal status: INITIAL   LONG TERM GOALS: Target date: 06/13/2023    Patient will stand for 20 minutes without increased pain or to perform ADLs Baseline:  Goal status: INITIAL  2.  Patient will go up and down 8 steps without increased pain in order to improve her ability to get in and out of her house Baseline:  Goal status: INITIAL  3.  Patient will squat to pick item off the floor in order to perform ADLs without increased left knee pain Baseline:  Goal status: INITIAL  PLAN:  PT FREQUENCY: 2x/week  PT DURATION: 10 weeks  PLANNED INTERVENTIONS: Therapeutic exercises, Therapeutic activity, Neuromuscular re-education, Balance training, Gait training, Patient/Family education, Self Care, Joint mobilization, Stair training, DME instructions, Aquatic Therapy, Dry Needling, Electrical stimulation, Cryotherapy, Moist heat, Ultrasound, and Manual therapy  PLAN FOR NEXT SESSION: Aquatics: Begin light weightbearing and knee flexion in the water.  Progress weightbearing as tolerated. Land; continue to work on progressing patient's ability to bend her own knee.  When able to bend knee better consider NuStep.  Continue with progressive strengthening of left quad and hip.  Next visit consider standing weight shifting.  Continue with gait training.  Check all possible CPT codes: 78469 - PT Re-evaluation, 97110- Therapeutic Exercise, [phone]- Neuro Re-education,  [phone] - Gait Training, [phone] - Manual Therapy, [phone] - Therapeutic Activities, [phone] - Self Care, [phone] - Electrical stimulation (Manual), [id number] - Ultrasound, and [id number] - Aquatic therapy    Check all conditions that are expected to impact treatment: {Conditions expected to impact treatment:Musculoskeletal disorders   If treatment provided at initial evaluation, no treatment charged due to lack of authorization.      Garreth Burnsworth Tomma Lightning) Kaitlynd Phillips MPT 04/24/2023, 2:52 PM

## 2023-04-26 ENCOUNTER — Ambulatory Visit (INDEPENDENT_AMBULATORY_CARE_PROVIDER_SITE_OTHER): Payer: Medicaid Other

## 2023-04-26 DIAGNOSIS — J309 Allergic rhinitis, unspecified: Secondary | ICD-10-CM

## 2023-04-30 ENCOUNTER — Ambulatory Visit (HOSPITAL_BASED_OUTPATIENT_CLINIC_OR_DEPARTMENT_OTHER): Payer: Medicaid Other | Admitting: Physical Therapy

## 2023-04-30 ENCOUNTER — Telehealth: Payer: Self-pay | Admitting: *Deleted

## 2023-04-30 ENCOUNTER — Encounter (HOSPITAL_BASED_OUTPATIENT_CLINIC_OR_DEPARTMENT_OTHER): Payer: Self-pay | Admitting: Physical Therapy

## 2023-04-30 ENCOUNTER — Ambulatory Visit (INDEPENDENT_AMBULATORY_CARE_PROVIDER_SITE_OTHER): Payer: Medicaid Other | Admitting: *Deleted

## 2023-04-30 DIAGNOSIS — M25662 Stiffness of left knee, not elsewhere classified: Secondary | ICD-10-CM

## 2023-04-30 DIAGNOSIS — M6281 Muscle weakness (generalized): Secondary | ICD-10-CM

## 2023-04-30 DIAGNOSIS — J309 Allergic rhinitis, unspecified: Secondary | ICD-10-CM | POA: Diagnosis not present

## 2023-04-30 DIAGNOSIS — M25562 Pain in left knee: Secondary | ICD-10-CM | POA: Diagnosis not present

## 2023-04-30 DIAGNOSIS — R2689 Other abnormalities of gait and mobility: Secondary | ICD-10-CM | POA: Diagnosis not present

## 2023-04-30 NOTE — Telephone Encounter (Signed)
Disclosed concerns to patient regarding A1C test results resulted on 04/23/2023. Asked patient to return to office to repeat blood work. Agreeable to doing so. Verbalized understanding. States she will call back once she speaks to relative.   Informed patient per [facility], there will be no charge to her for the lab draw or lab test for recollection.   On 04/26/2023, [facility] representative called and stated there was no evidence of hemolysis, just abnormal due to it's value of 6.7. Unable to explain the different value of 5.7. Awaiting call back from [facility] supervisor since 6/13. As calls were placed on 6/13( spoke to Leland), 6/14, and 6/15 (spoke to Elizabeth).

## 2023-04-30 NOTE — Therapy (Signed)
OUTPATIENT PHYSICAL THERAPY LOWER EXTREMITY EVALUATION   Patient Name: Rachel [name] MRN: [id number] DOB:07-04-1986, 37 y.o., female Today's Date: 04/30/2023  END OF SESSION:  PT End of Session - 04/30/23 0908     Visit Number 5    Number of Visits 20    Date for PT Re-Evaluation 06/13/23    Authorization Type medicaid    PT Start Time 0904    PT Stop Time 0945    PT Time Calculation (min) 41 min    Activity Tolerance Patient tolerated treatment well    Behavior During Therapy [facility] for tasks assessed/performed             Past Medical History:  Diagnosis Date   ADHD (attention deficit hyperactivity disorder)    Anemia    Anxiety    Asthma    Bipolar disorder (HCC)    Complication of anesthesia    slow to wake up    Delayed gastric emptying 02/23/2020   Depression    DUB (dysfunctional uterine bleeding) 02/23/2020   Eczema    Ehlers-Danlos syndrome    Endometriosis    Gastroparesis    GERD (gastroesophageal reflux disease)    Heart murmur    Hiatal hernia 04/06/2020   Myalgia 04/06/2020   Nausea & vomiting 09/22/2022   Parasitic infection 02/23/2020   Pneumonia    Poor appetite 02/23/2020   Recurrent upper respiratory infection (URI)    RUQ pain 02/23/2020   Vitamin D deficiency    Weight loss 02/23/2020   Past Surgical History:  Procedure Laterality Date   barium study     COLONOSCOPY     CYSTOSCOPY N/A 07/12/2021   Procedure: CYSTOSCOPY;  Surgeon: Warden Fillers, MD;  Location: [facility] OR;  Service: Gynecology;  Laterality: N/A;   ESOPHAGOGASTRODUODENOSCOPY  2011   and a ph study as well.    LAPAROSCOPIC GASTRIC RESECTION N/A 01/22/2023   Procedure: LAPAROSCOPIC GASTRIC PEXY;  Surgeon: Quentin Ore, MD;  Location: [facility];  Service: General;  Laterality: N/A;   LAPAROSCOPY N/A 01/26/2021   Procedure: LAPAROSCOPY DIAGNOSTIC;  Surgeon: Warden Fillers, MD;  Location: [facility];  Service: Gynecology;  Laterality: N/A;    NASAL SEPTUM SURGERY     SINOSCOPY     TONGUE FLAP     TOTAL LAPAROSCOPIC HYSTERECTOMY WITH SALPINGECTOMY Bilateral 07/12/2021   Procedure: TOTAL LAPAROSCOPIC HYSTERECTOMY WITH SALPINGECTOMY, OOPHORECTOMY;  Surgeon: Warden Fillers, MD;  Location: [facility];  Service: Gynecology;  Laterality: Bilateral;   UPPER GASTROINTESTINAL ENDOSCOPY     WISDOM TOOTH EXTRACTION     Patient Active Problem List   Diagnosis Date Noted   Chronic migraine w/o aura w/o status migrainosus, not intractable 02/23/2023   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   Memory loss 12/25/2022   Paresthesia 12/25/2022   Other allergic rhinitis 12/04/2022   Allergic conjunctivitis of both eyes 12/04/2022   Mild intermittent asthma without complication 12/04/2022   Other atopic dermatitis 12/04/2022   Allergic reaction 12/04/2022   Rash and other nonspecific skin eruption 12/04/2022   Multiple drug allergies 12/04/2022   Other adverse food reactions, not elsewhere classified, subsequent encounter 12/04/2022   Abnormal CT of the abdomen 09/23/2022   Abdominal pain 09/22/2022   Asthma, chronic 09/22/2022   HLD (hyperlipidemia) 09/22/2022   Nausea & vomiting 09/22/2022   Iron deficiency anemia 01/20/2022   Acute hyponatremia 01/18/2022   Pyelonephritis 01/18/2022   Protein-calorie malnutrition, severe 01/18/2022   sepsis secondary to pyelonephritis of left kidney 01/17/2022  Sepsis (HCC) 01/17/2022   Chronic abdominal pain 01/17/2022   Dyspnea 01/17/2022   Underweight 01/17/2022   Irritable bowel syndrome (IBS) 01/17/2022   Hypokalemia 01/17/2022   Vasomotor symptoms due to menopause 08/17/2021   S/P laparoscopic hysterectomy 07/12/2021   Preoperative exam for gynecologic surgery 06/20/2021   Draining postoperative wound 02/28/2021   Encounter for postoperative care 02/16/2021   Endometriosis determined by laparoscopy    Pelvic pain in female 10/11/2020   Presence of subdermal contraceptive implant 10/11/2020    Dysmenorrhea 10/11/2020   Dyspareunia in female 10/11/2020   NSVT (nonsustained ventricular tachycardia) (HCC) 09/03/2020   Gastroparesis 08/18/2020   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   Palpitations 07/26/2020   Family history of connective tissue disease 07/26/2020   Myalgia 04/06/2020   Hiatal hernia 04/06/2020   Recurrent infections 02/23/2020   Weight loss 02/23/2020   Poor appetite 02/23/2020   RUQ pain 02/23/2020   Delayed gastric emptying 02/23/2020   DUB (dysfunctional uterine bleeding) 02/23/2020   Cervical radiculopathy 05/31/2018   Hyperalgesia 05/31/2018   Muscle weakness 03/13/2018   GERD (gastroesophageal reflux disease) 12/26/2017   ADHD (attention deficit hyperactivity disorder) 05/07/2012   Bipolar 1 disorder (HCC) 05/07/2012    PCP: Dr Hoy Register   REFERRING PROVIDER: Hazle Nordmann PA   REFERRING DIAG:  Diagnosis  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  Relying on it more today than usual due to body responding to wet weather  PERTINENT HISTORY: Anxiety, EDH  PAIN:  Are you having pain? Yes: NPRS scale: 7/10  Pain location: medial knee and deep inside Pain description: aching  Aggravating factors: Sitting is the worst; standing is slightly better but gets worse  Relieving factors: good weather; elevating   PRECAUTIONS: None  WEIGHT BEARING RESTRICTIONS: No  FALLS:  Has patient fallen in last 6 months? No close to falls at times   LIVING ENVIRONMENT: Steps inside and outside   OCCUPATION:  Unable to work   Hobbies: Careers information officer sports  Traveling  Music       PLOF: Independent  PATIENT GOALS: to  build strength and to walk better   NEXT MD VISIT:   OBJECTIVE:   DIAGNOSTIC FINDINGS: MRI: (-) for acute injury   PATIENT SURVEYS:  LEFS 17  COGNITION: Overall cognitive status: Within functional limits for tasks assessed     SENSATION: Reports numbness and tingling in her left hand at times  EDEMA:  MUSCLE LENGTH: POSTURE: No Significant postural limitations  PALPATION: Tender to palpation in medial knee  LOWER EXTREMITY ROM:  Active ROM Right eval Left eval  Hip flexion    Hip extension    Hip abduction    Hip adduction    Hip internal rotation    Hip external rotation    Knee flexion  40 with significant pain   Knee extension  0  Ankle dorsiflexion    Ankle plantarflexion    Ankle inversion    Ankle eversion     (Blank rows = not tested)  LOWER EXTREMITY MMT:  MMT Right eval Left eval  Hip flexion 13.9 <5   Hip extension    Hip abduction 15.4 <5  Hip adduction  Hip internal rotation    Hip external rotation    Knee flexion    Knee extension 17.5 <5  Ankle dorsiflexion    Ankle plantarflexion    Ankle inversion    Ankle eversion     (Blank rows = not tested)  LOWER EXTREMITY SPECIAL TESTS:   FUNCTIONAL TESTS:  Unable to squat and bend knee   GAIT: Ambulates with stiff left knee, decreased weightbearing on left side, increased hip circumduction on left side.  Patient initially ambulating with cane on left side.  She was shifting more weight onto the left leg.  Therapy adjusted came to the right side.  Patient had improved gait pattern with cane on the right side.  TODAY'S TREATMENT:                                                                                                                              Pt seen for aquatic therapy today.  Treatment took place in water 3.5-4.75 ft in depth at the [facility] pool. Temp of water was 91.  Pt entered/exited the pool via stair using step to pattern with bilat hand rail.  *walking  supported by noodle 3.32ft - 4.0 forward/back.  Cues for proper gait pattern *Sitting balance on thick yellow noodle: hands elevated then float pushed down *Standing: ue support yellow noodle pushed against wall: df; pf; add/abd *step ups water step in 3.6ft x 5 leading R. Cues for eccentric and concentric control. *STS from 3rd step 80-85% submerged x 5.  Cues for left knee extension to rise *Squats in 3.6 ft x 4.  (Left knee pop) *cycling on noodle; hip add/abd; skiing; LAQ   Pt requires the buoyancy and hydrostatic pressure of water for support, and to offload joints by unweighting joint load by at least 50 % in navel deep water and by at least 75-80% in chest to neck deep water.  Viscosity of the water is needed for resistance of strengthening. Water current perturbations provides challenge to standing balance requiring increased core activation.     Exercises - Supine Quad Set  - 5 x daily - 7 x weekly - 1 sets - 5 reps - 2-3sec  hold - Supine Heel Slide with Strap  - 1 x daily - 7 x weekly - 1 sets - 3 reps - 20 se hold  PATIENT EDUCATION:  Education details: HEP; symptom management; benefits of aquatics  Person educated: Patient Education method: Explanation, Demonstration, Tactile cues, Verbal cues, and Handouts Education comprehension: verbalized understanding, returned demonstration, verbal cues required, tactile cues required, and needs further education  HOME EXERCISE PROGRAM: Access Code: [email or web address] URL: [email or web address] Date: 04/04/2023 Prepared by: Lorayne Bender   Added Aquatics: This aquatic home exercise program from Med[employer] utilizes pictures from land based exercises, but has been adapted prior to lamination and issuance.  Access Code: [email or web address] URL: [email or web address] Date: 04/11/2023 Prepared by: Geni Bers  ASSESSMENT:  CLINICAL IMPRESSION:  Attempts at increased  strengthening challenges met with difficulty. Pt putting  forth good effort .  She has difficulty engaging left quad for knee extension loaded @ ~15-20 body weight. Visibly left knee flex improve to >90d (with cycling in vertical suspension). Goals ongoing     Initial clinical impression Patient is a 37 year old female presents to physical therapy with an acute left knee injury on February 17, 2023.  She reported a dislocation with relocation of her patella.  MRI reveals no structural damage to her knee.  She presents with a significant flexion movement limitation at this time.  She reports difficulty performing any activity that involves bending her knee.  Her strength measurements measured less than 5 pounds with all major muscle groups on the left side.  Left knee extension not measured but is soon to be less than 5 pounds secondary to difficulty moving it against gravity.  She has been using a cane for ambulation.  Therapy moved her cane to the right side to allow for a better gait pattern.  She has had success with pool therapy in the past.  She would benefit from aquatic therapy to improve her ability to weight-bear and bend her left knee.  We will continue to progress her exercises to improve her general functional mobility.  OBJECTIVE IMPAIRMENTS: Abnormal gait, decreased activity tolerance, decreased knowledge of use of DME, difficulty walking, decreased ROM, decreased strength, postural dysfunction, and pain.   ACTIVITY LIMITATIONS: carrying, lifting, bending, sitting, standing, squatting, stairs, transfers, bed mobility, dressing, and locomotion level  PARTICIPATION LIMITATIONS: meal prep, cleaning, laundry, driving, shopping, occupation, yard work, and school  PERSONAL FACTORS: 3+ comorbidities: anxiety; gastroperiesis, EDH,   are also affecting patient's functional outcome.   REHAB POTENTIAL: Good  CLINICAL DECISION MAKING: Unstable/unpredictablesignificantly limited ability to bend knee with progressive loss of general mobility  EVALUATION  COMPLEXITY: High   GOALS: Goals reviewed with patient? Yes  SHORT TERM GOALS: Target date: 05/09/2023   Patient will increase passive knee flexion to 80 degrees Baseline: Goal status: INITIAL  2.  Patient will get on and off the plinth without having to use hands to help her left leg Baseline:  Goal status: INITIAL  3.  Patient will be independent with basic land and water HEP Baseline:  Goal status: INITIAL  4.  Patient will transfer sit to stand without significant increase in pain Baseline:  Goal status: INITIAL  5.  Patient will demonstrate greater than 5 pounds of strength in left lower extremity Baseline:  Goal status: INITIAL   LONG TERM GOALS: Target date: 06/13/2023    Patient will stand for 20 minutes without increased pain or to perform ADLs Baseline:  Goal status: INITIAL  2.  Patient will go up and down 8 steps without increased pain in order to improve her ability to get in and out of her house Baseline:  Goal status: INITIAL  3.  Patient will squat to pick item off the floor in order to perform ADLs without increased left knee pain Baseline:  Goal status: INITIAL  PLAN:  PT FREQUENCY: 2x/week  PT DURATION: 10 weeks  PLANNED INTERVENTIONS: Therapeutic exercises, Therapeutic activity, Neuromuscular re-education, Balance training, Gait training, Patient/Family education, Self Care, Joint mobilization, Stair training, DME instructions, Aquatic Therapy, Dry Needling, Electrical stimulation, Cryotherapy, Moist heat, Ultrasound, and Manual therapy  PLAN FOR NEXT SESSION: Aquatics: Begin light weightbearing and knee flexion in the water.  Progress weightbearing as tolerated. Land; continue to work on progressing patient's ability to bend her own knee.  When able to bend knee better consider NuStep.  Continue with progressive strengthening of left quad and hip.  Next visit consider standing weight shifting.  Continue with gait training.  Check all possible  CPT codes: 16109 - PT Re-evaluation, 97110- Therapeutic Exercise, [phone]- Neuro Re-education, [phone] - Gait Training, [phone] - Manual Therapy, [phone] - Therapeutic Activities, [phone] - Self Care, [phone] - Electrical stimulation (Manual), [id number] - Ultrasound, and [id number] - Aquatic therapy    Check all conditions that are expected to impact treatment: {Conditions expected to impact treatment:Musculoskeletal disorders   If treatment provided at initial evaluation, no treatment charged due to lack of authorization.      Maresa Morash Tomma Lightning) Dorissa Stinnette MPT 04/30/2023, 9:47 AM

## 2023-05-02 ENCOUNTER — Encounter (HOSPITAL_BASED_OUTPATIENT_CLINIC_OR_DEPARTMENT_OTHER): Payer: Self-pay | Admitting: Physical Therapy

## 2023-05-02 ENCOUNTER — Ambulatory Visit (HOSPITAL_BASED_OUTPATIENT_CLINIC_OR_DEPARTMENT_OTHER): Payer: Medicaid Other | Admitting: Physical Therapy

## 2023-05-02 DIAGNOSIS — R2689 Other abnormalities of gait and mobility: Secondary | ICD-10-CM

## 2023-05-02 DIAGNOSIS — M6281 Muscle weakness (generalized): Secondary | ICD-10-CM

## 2023-05-02 DIAGNOSIS — M25662 Stiffness of left knee, not elsewhere classified: Secondary | ICD-10-CM | POA: Diagnosis not present

## 2023-05-02 DIAGNOSIS — M25562 Pain in left knee: Secondary | ICD-10-CM | POA: Diagnosis not present

## 2023-05-02 NOTE — Therapy (Signed)
OUTPATIENT PHYSICAL THERAPY LOWER EXTREMITY EVALUATION   Patient Name: Rachel [name] MRN: [id number] DOB:June 10, 1986, 37 y.o., female Today's Date: 05/02/2023  END OF SESSION:  PT End of Session - 05/02/23 1003     Visit Number 6    Number of Visits 20    Date for PT Re-Evaluation 06/13/23    Authorization Type medicaid  [employer] 6 visits approved from  05/02/23-06/30/23 today is 6/12 visits 6/19    PT Start Time 0946    PT Stop Time 1030    PT Time Calculation (min) 44 min    Activity Tolerance Patient tolerated treatment well    Behavior During Therapy [facility] for tasks assessed/performed              Past Medical History:  Diagnosis Date   ADHD (attention deficit hyperactivity disorder)    Anemia    Anxiety    Asthma    Bipolar disorder (HCC)    Complication of anesthesia    slow to wake up    Delayed gastric emptying 02/23/2020   Depression    DUB (dysfunctional uterine bleeding) 02/23/2020   Eczema    Ehlers-Danlos syndrome    Endometriosis    Gastroparesis    GERD (gastroesophageal reflux disease)    Heart murmur    Hiatal hernia 04/06/2020   Myalgia 04/06/2020   Nausea & vomiting 09/22/2022   Parasitic infection 02/23/2020   Pneumonia    Poor appetite 02/23/2020   Recurrent upper respiratory infection (URI)    RUQ pain 02/23/2020   Vitamin D deficiency    Weight loss 02/23/2020   Past Surgical History:  Procedure Laterality Date   barium study     COLONOSCOPY     CYSTOSCOPY N/A 07/12/2021   Procedure: CYSTOSCOPY;  Surgeon: Warden Fillers, MD;  Location: [facility] OR;  Service: Gynecology;  Laterality: N/A;   ESOPHAGOGASTRODUODENOSCOPY  2011   and a ph study as well.    LAPAROSCOPIC GASTRIC RESECTION N/A 01/22/2023   Procedure: LAPAROSCOPIC GASTRIC PEXY;  Surgeon: Quentin Ore, MD;  Location: [facility] ORS;  Service: General;  Laterality: N/A;   LAPAROSCOPY N/A 01/26/2021   Procedure: LAPAROSCOPY DIAGNOSTIC;  Surgeon: Warden Fillers, MD;   Location: [facility];  Service: Gynecology;  Laterality: N/A;   NASAL SEPTUM SURGERY     SINOSCOPY     TONGUE FLAP     TOTAL LAPAROSCOPIC HYSTERECTOMY WITH SALPINGECTOMY Bilateral 07/12/2021   Procedure: TOTAL LAPAROSCOPIC HYSTERECTOMY WITH SALPINGECTOMY, OOPHORECTOMY;  Surgeon: Warden Fillers, MD;  Location: [facility] OR;  Service: Gynecology;  Laterality: Bilateral;   UPPER GASTROINTESTINAL ENDOSCOPY     WISDOM TOOTH EXTRACTION     Patient Active Problem List   Diagnosis Date Noted   Chronic migraine w/o aura w/o status migrainosus, not intractable 02/23/2023   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   Memory loss 12/25/2022   Paresthesia 12/25/2022   Other allergic rhinitis 12/04/2022   Allergic conjunctivitis of both eyes 12/04/2022   Mild intermittent asthma without complication 12/04/2022   Other atopic dermatitis 12/04/2022   Allergic reaction 12/04/2022   Rash and other nonspecific skin eruption 12/04/2022   Multiple drug allergies 12/04/2022   Other adverse food reactions, not elsewhere classified, subsequent encounter 12/04/2022   Abnormal CT of the abdomen 09/23/2022   Abdominal pain 09/22/2022   Asthma, chronic 09/22/2022   HLD (hyperlipidemia) 09/22/2022   Nausea & vomiting 09/22/2022   Iron deficiency anemia 01/20/2022   Acute hyponatremia 01/18/2022   Pyelonephritis 01/18/2022  Protein-calorie malnutrition, severe 01/18/2022   sepsis secondary to pyelonephritis of left kidney 01/17/2022   Sepsis (HCC) 01/17/2022   Chronic abdominal pain 01/17/2022   Dyspnea 01/17/2022   Underweight 01/17/2022   Irritable bowel syndrome (IBS) 01/17/2022   Hypokalemia 01/17/2022   Vasomotor symptoms due to menopause 08/17/2021   S/P laparoscopic hysterectomy 07/12/2021   Preoperative exam for gynecologic surgery 06/20/2021   Draining postoperative wound 02/28/2021   Encounter for postoperative care 02/16/2021   Endometriosis determined by laparoscopy    Pelvic pain in female  10/11/2020   Presence of subdermal contraceptive implant 10/11/2020   Dysmenorrhea 10/11/2020   Dyspareunia in female 10/11/2020   NSVT (nonsustained ventricular tachycardia) (HCC) 09/03/2020   Gastroparesis 08/18/2020   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   Palpitations 07/26/2020   Family history of connective tissue disease 07/26/2020   Myalgia 04/06/2020   Hiatal hernia 04/06/2020   Recurrent infections 02/23/2020   Weight loss 02/23/2020   Poor appetite 02/23/2020   RUQ pain 02/23/2020   Delayed gastric emptying 02/23/2020   DUB (dysfunctional uterine bleeding) 02/23/2020   Cervical radiculopathy 05/31/2018   Hyperalgesia 05/31/2018   Muscle weakness 03/13/2018   GERD (gastroesophageal reflux disease) 12/26/2017   ADHD (attention deficit hyperactivity disorder) 05/07/2012   Bipolar 1 disorder (HCC) 05/07/2012    PCP: Dr Hoy Register   REFERRING PROVIDER: Hazle Nordmann PA   REFERRING DIAG:  Diagnosis  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  Was able to rest and recover by next day.  She states today she is having a low pain day.  PERTINENT HISTORY: Anxiety, EDH  PAIN:  Are you having pain? Yes: NPRS scale: 5/10  Pain location: medial knee and deep inside Pain description: aching  Aggravating factors: Sitting is the worst; standing is slightly better but gets worse  Relieving factors: good weather; elevating   PRECAUTIONS: None  WEIGHT BEARING RESTRICTIONS: No  FALLS:  Has patient fallen in last 6 months? No close to falls at times   LIVING ENVIRONMENT: Steps  inside and outside   OCCUPATION:  Unable to work   Hobbies: Careers information officer sports  Traveling  Music       PLOF: Independent  PATIENT GOALS: to build strength and to walk better   NEXT MD VISIT:   OBJECTIVE:   DIAGNOSTIC FINDINGS: MRI: (-) for acute injury   PATIENT SURVEYS:  LEFS 17  COGNITION: Overall cognitive status: Within functional limits for tasks assessed     SENSATION: Reports numbness and tingling in her left hand at times  EDEMA:  MUSCLE LENGTH: POSTURE: No Significant postural limitations  PALPATION: Tender to palpation in medial knee  LOWER EXTREMITY ROM:  Active ROM Right eval Left eval  Hip flexion    Hip extension    Hip abduction    Hip adduction    Hip internal rotation    Hip external rotation    Knee flexion  40 with significant pain   Knee extension  0  Ankle dorsiflexion    Ankle plantarflexion    Ankle inversion    Ankle eversion     (Blank rows =  not tested)  LOWER EXTREMITY MMT:  MMT Right eval Left eval  Hip flexion 13.9 <5   Hip extension    Hip abduction 15.4 <5  Hip adduction    Hip internal rotation    Hip external rotation    Knee flexion    Knee extension 17.5 <5  Ankle dorsiflexion    Ankle plantarflexion    Ankle inversion    Ankle eversion     (Blank rows = not tested)  LOWER EXTREMITY SPECIAL TESTS:   FUNCTIONAL TESTS:  Unable to squat and bend knee   GAIT: Ambulates with stiff left knee, decreased weightbearing on left side, increased hip circumduction on left side.  Patient initially ambulating with cane on left side.  She was shifting more weight onto the left leg.  Therapy adjusted came to the right side.  Patient had improved gait pattern with cane on the right side.  TODAY'S TREATMENT:                                                                                                                              Pt seen for aquatic therapy today.  Treatment took place in water 3.5-4.75 ft in  depth at the [facility] pool. Temp of water was 91.  Pt entered/exited the pool via stair using step to pattern with bilat hand rail.  *walking supported by noodle 3.50ft - 4.0 forward/back.  Cues for proper gait pattern *Sitting balance on thick yellow noodle: hands elevated then float pushed down *TrA sets: noodle solid pull down wide stance then staggered x 10 ea *Plank on solid noodle holding x 30 secs; with arm raised x 5 then leg raises x 5 *cycling on noodle; hip add/abd; skiing; LAQ *step ups water step in 4.0 ft leading R/L x 8 reps. Cues for quad engagement for knee extension left  Pt requires the buoyancy and hydrostatic pressure of water for support, and to offload joints by unweighting joint load by at least 50 % in navel deep water and by at least 75-80% in chest to neck deep water.  Viscosity of the water is needed for resistance of strengthening. Water current perturbations provides challenge to standing balance requiring increased core activation.     Exercises - Supine Quad Set  - 5 x daily - 7 x weekly - 1 sets - 5 reps - 2-3sec  hold - Supine Heel Slide with Strap  - 1 x daily - 7 x weekly - 1 sets - 3 reps - 20 se hold  PATIENT EDUCATION:  Education details: HEP; symptom management; benefits of aquatics  Person educated: Patient Education method: Explanation, Demonstration, Tactile cues, Verbal cues, and Handouts Education comprehension: verbalized understanding, returned demonstration, verbal cues required, tactile cues required, and needs further education  HOME EXERCISE PROGRAM: Access Code: [email or web address] URL: [email or web address] Date: 04/04/2023 Prepared by: Lorayne Bender   Added Aquatics: This aquatic home exercise program from [employer] utilizes pictures from land based  exercises, but has been adapted prior to lamination and issuance.  Access Code: [email or web address] URL: [email or web address] Date: 04/11/2023 Prepared by: Geni Bers  ASSESSMENT:  CLINICAL IMPRESSION:  Pt reports she expects an up tick in pain after therapy sessions although she states her long term response is positive, tolerating ADL's and functional mobility with improvement. Pt with improved execution of stair climbing simulated in 4 ft. She continues to have difficulty engaging VMO.      Initial clinical impression Patient is a 37 year old female presents to physical therapy with an acute left knee injury on February 17, 2023.  She reported a dislocation with relocation of her patella.  MRI reveals no structural damage to her knee.  She presents with a significant flexion movement limitation at this time.  She reports difficulty performing any activity that involves bending her knee.  Her strength measurements measured less than 5 pounds with all major muscle groups on the left side.  Left knee extension not measured but is soon to be less than 5 pounds secondary to difficulty moving it against gravity.  She has been using a cane for ambulation.  Therapy moved her cane to the right side to allow for a better gait pattern.  She has had success with pool therapy in the past.  She would benefit from aquatic therapy to improve her ability to weight-bear and bend her left knee.  We will continue to progress her exercises to improve her general functional mobility.  OBJECTIVE IMPAIRMENTS: Abnormal gait, decreased activity tolerance, decreased knowledge of use of DME, difficulty walking, decreased ROM, decreased strength, postural dysfunction, and pain.   ACTIVITY LIMITATIONS: carrying, lifting, bending, sitting, standing, squatting, stairs, transfers, bed mobility, dressing, and locomotion level  PARTICIPATION LIMITATIONS: meal prep, cleaning, laundry, driving, shopping, occupation, yard work, and school  PERSONAL FACTORS: 3+ comorbidities: anxiety; gastroperiesis, EDH,   are also affecting patient's functional outcome.   REHAB POTENTIAL: Good  CLINICAL  DECISION MAKING: Unstable/unpredictablesignificantly limited ability to bend knee with progressive loss of general mobility  EVALUATION COMPLEXITY: High   GOALS: Goals reviewed with patient? Yes  SHORT TERM GOALS: Target date: 05/09/2023   Patient will increase passive knee flexion to 80 degrees Baseline: Goal status: INITIAL  2.  Patient will get on and off the plinth without having to use hands to help her left leg Baseline:  Goal status: INITIAL  3.  Patient will be independent with basic land and water HEP Baseline:  Goal status: INITIAL  4.  Patient will transfer sit to stand without significant increase in pain Baseline:  Goal status: INITIAL  5.  Patient will demonstrate greater than 5 pounds of strength in left lower extremity Baseline:  Goal status: INITIAL   LONG TERM GOALS: Target date: 06/13/2023    Patient will stand for 20 minutes without increased pain or to perform ADLs Baseline:  Goal status: INITIAL  2.  Patient will go up and down 8 steps without increased pain in order to improve her ability to get in and out of her house Baseline:  Goal status: INITIAL  3.  Patient will squat to pick item off the floor in order to perform ADLs without increased left knee pain Baseline:  Goal status: INITIAL  PLAN:  PT FREQUENCY: 2x/week  PT DURATION: 10 weeks  PLANNED INTERVENTIONS: Therapeutic exercises, Therapeutic activity, Neuromuscular re-education, Balance training, Gait training, Patient/Family education, Self Care, Joint mobilization, Stair training, DME instructions, Aquatic Therapy, Dry Needling, Electrical stimulation, Cryotherapy, Moist heat, Ultrasound, and Manual  therapy  PLAN FOR NEXT SESSION: Aquatics: Begin light weightbearing and knee flexion in the water.  Progress weightbearing as tolerated. Land; continue to work on progressing patient's ability to bend her own knee.  When able to bend knee better consider NuStep.  Continue with  progressive strengthening of left quad and hip.  Next visit consider standing weight shifting.  Continue with gait training.  Check all possible CPT codes: 16109 - PT Re-evaluation, 97110- Therapeutic Exercise, [phone]- Neuro Re-education, [phone] - Gait Training, [phone] - Manual Therapy, [phone] - Therapeutic Activities, [phone] - Self Care, [phone] - Electrical stimulation (Manual), [id number] - Ultrasound, and [id number] - Aquatic therapy    Check all conditions that are expected to impact treatment: {Conditions expected to impact treatment:Musculoskeletal disorders   If treatment provided at initial evaluation, no treatment charged due to lack of authorization.      Johnnette Laux (Frankie) Jonaya Freshour MPT 05/02/2023, 10:40 AM

## 2023-05-03 ENCOUNTER — Ambulatory Visit: Payer: Medicaid Other

## 2023-05-03 ENCOUNTER — Ambulatory Visit (INDEPENDENT_AMBULATORY_CARE_PROVIDER_SITE_OTHER): Payer: Medicaid Other | Admitting: Clinical

## 2023-05-03 DIAGNOSIS — F4310 Post-traumatic stress disorder, unspecified: Secondary | ICD-10-CM

## 2023-05-03 DIAGNOSIS — Z658 Other specified problems related to psychosocial circumstances: Secondary | ICD-10-CM

## 2023-05-03 DIAGNOSIS — F909 Attention-deficit hyperactivity disorder, unspecified type: Secondary | ICD-10-CM

## 2023-05-03 NOTE — BH Specialist Note (Addendum)
Integrated Behavioral Health via Telemedicine Visit  05/03/2023 Rachel [name] [id number]  Number of Integrated Behavioral Health Clinician visits: Additional Visit  Session Start time: 1047   Session End time: 1206  Total time in minutes: 79   Referring Provider: Mariel Aloe, MD Patient/Family location: Home [facility] Provider location: [facility]  All persons participating in visit: [name] and Rachel [name]   Types of Service: Individual psychotherapy and Telephone visit  I connected with Rachel [name] [name]'s  n/a  via  Telephone or Video Enabled Telemedicine Application  (Video is Caregility application) and verified that I am speaking with the correct person using two identifiers. Discussed confidentiality: Yes   I discussed the limitations of telemedicine and the availability of in person appointments.  Discussed there is a possibility of technology failure and discussed alternative modes of communication if that failure occurs.  I discussed that engaging in this telemedicine visit, they consent to the provision of behavioral healthcare and the services will be billed under their insurance.  Patient and/or legal guardian expressed understanding and consented to Telemedicine visit: Yes   Presenting Concerns: Patient and/or family reports the following symptoms/concerns: Contemplating upcoming decisions; options to move towards big life goals while maintaining commitment to personal values.  Duration of problem: ongoing; Severity of problem: moderate  Patient and/or Family's Strengths/Protective Factors: Social connections, Concrete supports in place (healthy food, safe environments, etc.), and Sense of purpose  Goals Addressed: Patient will:  Maintain reduction of symptoms of: anxiety, depression, and stress   Increase knowledge and/or ability  of: stress reduction   Demonstrate ability to: Increase healthy adjustment to current life circumstances  Progress towards Goals: Ongoing  Interventions: Interventions utilized:  Supportive Reflection and ACT (Acceptance and Commitment Therapy) Standardized Assessments completed: Not Needed  Patient and/or Family Response: Patient agrees with treatment plan.   Assessment: Patient currently experiencing ADHD, PTSD; Psychosocial stress.   Patient may benefit from continued therapeutic intervention  .  Plan: Follow up with behavioral health clinician on : Three weeks Behavioral recommendations:  -Continue daily self-coping strategies as needed; continue plan to carefully consider next step/decision towards goals -Consider daily reminders to "keep dignity intact" and "self-kindness" (via texts to self, etc.) to maintain important personal values Referral(s): Integrated Rachel [name] (In Clinic)  I discussed the assessment and treatment plan with the patient and/or parent/guardian. They were provided an opportunity to ask questions and all were answered. They agreed with the plan and demonstrated an understanding of the instructions.   They were advised to call back or seek an in-person evaluation if the symptoms worsen or if the condition fails to improve as anticipated.  Rachel [name] Rachel Blakney, LCSW

## 2023-05-06 ENCOUNTER — Other Ambulatory Visit: Payer: Self-pay | Admitting: Gastroenterology

## 2023-05-07 ENCOUNTER — Encounter (HOSPITAL_BASED_OUTPATIENT_CLINIC_OR_DEPARTMENT_OTHER): Payer: Self-pay | Admitting: Physical Therapy

## 2023-05-07 ENCOUNTER — Ambulatory Visit (HOSPITAL_BASED_OUTPATIENT_CLINIC_OR_DEPARTMENT_OTHER): Payer: Medicaid Other | Admitting: Physical Therapy

## 2023-05-07 ENCOUNTER — Other Ambulatory Visit: Payer: Self-pay | Admitting: Family Medicine

## 2023-05-07 ENCOUNTER — Telehealth: Payer: Self-pay | Admitting: Gastroenterology

## 2023-05-07 DIAGNOSIS — M25562 Pain in left knee: Secondary | ICD-10-CM | POA: Diagnosis not present

## 2023-05-07 DIAGNOSIS — M25662 Stiffness of left knee, not elsewhere classified: Secondary | ICD-10-CM

## 2023-05-07 DIAGNOSIS — M6281 Muscle weakness (generalized): Secondary | ICD-10-CM | POA: Diagnosis not present

## 2023-05-07 DIAGNOSIS — R2689 Other abnormalities of gait and mobility: Secondary | ICD-10-CM | POA: Diagnosis not present

## 2023-05-07 MED ORDER — LINACLOTIDE 290 MCG PO CAPS: ORAL_CAPSULE | ORAL | 3 refills | Status: DC

## 2023-05-07 NOTE — Telephone Encounter (Signed)
Refilled patients medication she needs to make a follow up appointment, note to the pharmacy   Sent to [facility]

## 2023-05-07 NOTE — Therapy (Signed)
OUTPATIENT PHYSICAL THERAPY LOWER EXTREMITY EVALUATION   Patient Name: Rachel [name] MRN: [id number] DOB:May 16, 1986, 37 y.o., female Today's Date: 05/07/2023  END OF SESSION:  PT End of Session - 05/07/23 0953     Visit Number 7    Number of Visits 20    Date for PT Re-Evaluation 06/13/23    Authorization Type medicaid  [employer] 6 visits approved from  05/02/23-06/30/23 today is 7/12 visits 6/24    PT Start Time 0945    PT Stop Time 1025    PT Time Calculation (min) 40 min    Activity Tolerance Patient tolerated treatment well    Behavior During Therapy [facility] for tasks assessed/performed              Past Medical History:  Diagnosis Date   ADHD (attention deficit hyperactivity disorder)    Anemia    Anxiety    Asthma    Bipolar disorder (HCC)    Complication of anesthesia    slow to wake up    Delayed gastric emptying 02/23/2020   Depression    DUB (dysfunctional uterine bleeding) 02/23/2020   Eczema    Ehlers-Danlos syndrome    Endometriosis    Gastroparesis    GERD (gastroesophageal reflux disease)    Heart murmur    Hiatal hernia 04/06/2020   Myalgia 04/06/2020   Nausea & vomiting 09/22/2022   Parasitic infection 02/23/2020   Pneumonia    Poor appetite 02/23/2020   Recurrent upper respiratory infection (URI)    RUQ pain 02/23/2020   Vitamin D deficiency    Weight loss 02/23/2020   Past Surgical History:  Procedure Laterality Date   barium study     COLONOSCOPY     CYSTOSCOPY N/A 07/12/2021   Procedure: CYSTOSCOPY;  Surgeon: Warden Fillers, MD;  Location: [facility] OR;  Service: Gynecology;  Laterality: N/A;   ESOPHAGOGASTRODUODENOSCOPY  2011   and a ph study as well.    LAPAROSCOPIC GASTRIC RESECTION N/A 01/22/2023   Procedure: LAPAROSCOPIC GASTRIC PEXY;  Surgeon: Quentin Ore, MD;  Location: [facility];  Service: General;  Laterality: N/A;   LAPAROSCOPY N/A 01/26/2021   Procedure: LAPAROSCOPY DIAGNOSTIC;  Surgeon: Warden Fillers, MD;   Location: [facility];  Service: Gynecology;  Laterality: N/A;   NASAL SEPTUM SURGERY     SINOSCOPY     TONGUE FLAP     TOTAL LAPAROSCOPIC HYSTERECTOMY WITH SALPINGECTOMY Bilateral 07/12/2021   Procedure: TOTAL LAPAROSCOPIC HYSTERECTOMY WITH SALPINGECTOMY, OOPHORECTOMY;  Surgeon: Warden Fillers, MD;  Location: [facility] OR;  Service: Gynecology;  Laterality: Bilateral;   UPPER GASTROINTESTINAL ENDOSCOPY     WISDOM TOOTH EXTRACTION     Patient Active Problem List   Diagnosis Date Noted   Chronic migraine w/o aura w/o status migrainosus, not intractable 02/23/2023   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   Memory loss 12/25/2022   Paresthesia 12/25/2022   Other allergic rhinitis 12/04/2022   Allergic conjunctivitis of both eyes 12/04/2022   Mild intermittent asthma without complication 12/04/2022   Other atopic dermatitis 12/04/2022   Allergic reaction 12/04/2022   Rash and other nonspecific skin eruption 12/04/2022   Multiple drug allergies 12/04/2022   Other adverse food reactions, not elsewhere classified, subsequent encounter 12/04/2022   Abnormal CT of the abdomen 09/23/2022   Abdominal pain 09/22/2022   Asthma, chronic 09/22/2022   HLD (hyperlipidemia) 09/22/2022   Nausea & vomiting 09/22/2022   Iron deficiency anemia 01/20/2022   Acute hyponatremia 01/18/2022   Pyelonephritis 01/18/2022  Protein-calorie malnutrition, severe 01/18/2022   sepsis secondary to pyelonephritis of left kidney 01/17/2022   Sepsis (HCC) 01/17/2022   Chronic abdominal pain 01/17/2022   Dyspnea 01/17/2022   Underweight 01/17/2022   Irritable bowel syndrome (IBS) 01/17/2022   Hypokalemia 01/17/2022   Vasomotor symptoms due to menopause 08/17/2021   S/P laparoscopic hysterectomy 07/12/2021   Preoperative exam for gynecologic surgery 06/20/2021   Draining postoperative wound 02/28/2021   Encounter for postoperative care 02/16/2021   Endometriosis determined by laparoscopy    Pelvic pain in female  10/11/2020   Presence of subdermal contraceptive implant 10/11/2020   Dysmenorrhea 10/11/2020   Dyspareunia in female 10/11/2020   NSVT (nonsustained ventricular tachycardia) (HCC) 09/03/2020   Gastroparesis 08/18/2020   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   Palpitations 07/26/2020   Family history of connective tissue disease 07/26/2020   Myalgia 04/06/2020   Hiatal hernia 04/06/2020   Recurrent infections 02/23/2020   Weight loss 02/23/2020   Poor appetite 02/23/2020   RUQ pain 02/23/2020   Delayed gastric emptying 02/23/2020   DUB (dysfunctional uterine bleeding) 02/23/2020   Cervical radiculopathy 05/31/2018   Hyperalgesia 05/31/2018   Muscle weakness 03/13/2018   GERD (gastroesophageal reflux disease) 12/26/2017   ADHD (attention deficit hyperactivity disorder) 05/07/2012   Bipolar 1 disorder (HCC) 05/07/2012    PCP: Dr Hoy Register   REFERRING PROVIDER: Hazle Nordmann PA   REFERRING DIAG:  Diagnosis  M25.362 (ICD-10-CM) - Patellar instability of left knee    THERAPY DIAG:  Muscle weakness (generalized)  Acute pain of left knee  Stiffness of left knee, not elsewhere classified  Rationale for Evaluation and Treatment: Rehabilitation  ONSET DATE: April 2024 initial knee injury   SUBJECTIVE:   SUBJECTIVE STATEMENT: Pt reports feeling that her toleration to activity is improving.  Continues to feel popping in left knee with TKE in pool  PERTINENT HISTORY: Anxiety, EDH  PAIN:  Are you having pain? Yes: NPRS scale: 5/10  Pain location: medial knee and deep inside Pain description: aching  Aggravating factors: Sitting is the worst; standing is slightly better but gets worse  Relieving factors: good weather; elevating   PRECAUTIONS: None  WEIGHT BEARING RESTRICTIONS: No  FALLS:  Has patient fallen in last 6 months? No close to falls at times   LIVING ENVIRONMENT: Steps inside and outside   OCCUPATION:  Unable to work   Hobbies: Careers information officer  sports  Traveling  Music       PLOF: Independent  PATIENT GOALS: to build strength and to walk better   NEXT MD VISIT:   OBJECTIVE:   DIAGNOSTIC FINDINGS: MRI: (-) for acute injury   PATIENT SURVEYS:  LEFS 17  COGNITION: Overall cognitive status: Within functional limits for tasks assessed     SENSATION: Reports numbness and tingling in her left hand at times  EDEMA:  MUSCLE LENGTH: POSTURE: No Significant postural limitations  PALPATION: Tender to palpation in medial knee  LOWER EXTREMITY ROM:  Active ROM Right eval Left eval Left 05/07/23  Hip flexion     Hip extension     Hip abduction     Hip adduction     Hip internal rotation     Hip external rotation     Knee flexion  40 with significant pain  98 AROM Slight discomfort  Knee extension  0   Ankle dorsiflexion     Ankle plantarflexion     Ankle inversion     Ankle eversion      (Blank rows =  not tested)  LOWER EXTREMITY MMT:  MMT Right eval Left eval  Hip flexion 13.9 <5   Hip extension    Hip abduction 15.4 <5  Hip adduction    Hip internal rotation    Hip external rotation    Knee flexion    Knee extension 17.5 <5  Ankle dorsiflexion    Ankle plantarflexion    Ankle inversion    Ankle eversion     (Blank rows = not tested)  LOWER EXTREMITY SPECIAL TESTS:   FUNCTIONAL TESTS:  Unable to squat and bend knee   GAIT: Ambulates with stiff left knee, decreased weightbearing on left side, increased hip circumduction on left side.  Patient initially ambulating with cane on left side.  She was shifting more weight onto the left leg.  Therapy adjusted came to the right side.  Patient had improved gait pattern with cane on the right side.  TODAY'S TREATMENT:                                                                                                                              Pt seen for aquatic therapy today.  Treatment took place in water 3.5-4.75 ft in depth at the [facility] pool. Temp of water was 91.  Pt entered/exited the pool via stair using step to pattern with bilat hand rail.  *walking supported by noodle 3.67ft - 4.0 forward/back.  Cues for proper gait pattern *Sitting balance on thick yellow noodle: hands elevated then float pushed down *TrA sets: noodle (hollow) pull down wide stance then staggered x 10 ea *step ups water step in 4.0 ft leading R/L x 5 reps. Cues for quad engagement for knee extension left  - side stepping onto water step 4 ft x 5 R/L (difficult)  - forward step down ue support yellow HB and wall 70-75% submerged x 3-5 R/L (difficult CC left) *cycling on noodle *plank on solid noodle x 3 for up to 20s hold   Pt requires the buoyancy and hydrostatic pressure of water for support, and to offload joints by unweighting joint load by at least 50 % in navel deep water and by at least 75-80% in chest to neck deep water.  Viscosity of the water is needed for resistance of strengthening. Water current perturbations provides challenge to standing balance requiring increased core activation.     Exercises - Supine Quad Set  - 5 x daily - 7 x weekly - 1 sets - 5 reps - 2-3sec  hold - Supine Heel Slide with Strap  - 1 x daily - 7 x weekly - 1 sets - 3 reps - 20 se hold  PATIENT EDUCATION:  Education details: HEP; symptom management; benefits of aquatics  Person educated: Patient Education method: Explanation, Demonstration, Tactile cues, Verbal cues, and Handouts Education comprehension: verbalized understanding, returned demonstration, verbal cues required, tactile cues required, and needs further education  HOME EXERCISE PROGRAM: Access Code: [email or web address] URL: [email or web address]  Date: 04/04/2023 Prepared by: Lorayne Bender   Added Aquatics: This aquatic home exercise program from MedBridge utilizes pictures from land based exercises, but has been adapted prior to lamination and issuance.  Access Code:  [email or web address] URL: [email or web address] Date: 04/11/2023 Prepared by: Geni Bers  ASSESSMENT:  CLINICAL IMPRESSION:  Focus on left quad strength. Pt with some difficulty with tolerance due to some left knee discomfort. She requires multiple rest periods and low repetitions. She has not yet returned to poll to complete HEP.  She reports compliance with HEP that was assigned. She rises from chair using UE without demonstration of discomfort. Left knee ROM improved actively with no increase in pain. She has met 2 STG.  Will plan on strength testing next visit.      Initial clinical impression Patient is a 36 year old female presents to physical therapy with an acute left knee injury on February 17, 2023.  She reported a dislocation with relocation of her patella.  MRI reveals no structural damage to her knee.  She presents with a significant flexion movement limitation at this time.  She reports difficulty performing any activity that involves bending her knee.  Her strength measurements measured less than 5 pounds with all major muscle groups on the left side.  Left knee extension not measured but is soon to be less than 5 pounds secondary to difficulty moving it against gravity.  She has been using a cane for ambulation.  Therapy moved her cane to the right side to allow for a better gait pattern.  She has had success with pool therapy in the past.  She would benefit from aquatic therapy to improve her ability to weight-bear and bend her left knee.  We will continue to progress her exercises to improve her general functional mobility.  OBJECTIVE IMPAIRMENTS: Abnormal gait, decreased activity tolerance, decreased knowledge of use of DME, difficulty walking, decreased ROM, decreased strength, postural dysfunction, and pain.   ACTIVITY LIMITATIONS: carrying, lifting, bending, sitting, standing, squatting, stairs, transfers, bed mobility, dressing, and locomotion level  PARTICIPATION  LIMITATIONS: meal prep, cleaning, laundry, driving, shopping, occupation, yard work, and school  PERSONAL FACTORS: 3+ comorbidities: anxiety; gastroperiesis, EDH,   are also affecting patient's functional outcome.   REHAB POTENTIAL: Good  CLINICAL DECISION MAKING: Unstable/unpredictablesignificantly limited ability to bend knee with progressive loss of general mobility  EVALUATION COMPLEXITY: High   GOALS: Goals reviewed with patient? Yes  SHORT TERM GOALS: Target date: 05/09/2023   Patient will increase passive knee flexion to 80 degrees Baseline: Goal status: Met (05/07/23 98d AROM)  2.  Patient will get on and off the plinth without having to use hands to help her left leg Baseline:  Goal status: INITIAL  3.  Patient will be independent with basic land and water HEP Baseline:  Goal status: In progress 05/07/23  4.  Patient will transfer sit to stand without significant increase in pain Baseline:  Goal status: Met 05/07/23   5.  Patient will demonstrate greater than 5 pounds of strength in left lower extremity Baseline:  Goal status: INITIAL   LONG TERM GOALS: Target date: 06/13/2023    Patient will stand for 20 minutes without increased pain or to perform ADLs Baseline:  Goal status: INITIAL  2.  Patient will go up and down 8 steps without increased pain in order to improve her ability to get in and out of her house Baseline:  Goal status: INITIAL  3.  Patient will squat to pick item off the floor in  order to perform ADLs without increased left knee pain Baseline:  Goal status: INITIAL  PLAN:  PT FREQUENCY: 2x/week  PT DURATION: 10 weeks  PLANNED INTERVENTIONS: Therapeutic exercises, Therapeutic activity, Neuromuscular re-education, Balance training, Gait training, Patient/Family education, Self Care, Joint mobilization, Stair training, DME instructions, Aquatic Therapy, Dry Needling, Electrical stimulation, Cryotherapy, Moist heat, Ultrasound, and Manual  therapy  PLAN FOR NEXT SESSION: Aquatics: Begin light weightbearing and knee flexion in the water.  Progress weightbearing as tolerated. Land; continue to work on progressing patient's ability to bend her own knee.  When able to bend knee better consider NuStep.  Continue with progressive strengthening of left quad and hip.  Next visit consider standing weight shifting.  Continue with gait training.  Check all possible CPT codes: 16109 - PT Re-evaluation, 97110- Therapeutic Exercise, [phone]- Neuro Re-education, [phone] - Gait Training, [phone] - Manual Therapy, [phone] - Therapeutic Activities, [phone] - Self Care, [phone] - Electrical stimulation (Manual), [id number] - Ultrasound, and [id number] - Aquatic therapy    Check all conditions that are expected to impact treatment: {Conditions expected to impact treatment:Musculoskeletal disorders   If treatment provided at initial evaluation, no treatment charged due to lack of authorization.      Quiera Diffee Tomma Lightning) Chester Sibert MPT 05/07/2023, 10:43 AM

## 2023-05-07 NOTE — Telephone Encounter (Signed)
Inbound call from patient requesting a refill for linzess , she said she need by Friday 6/28 because she is going out of town.Please advise.

## 2023-05-07 NOTE — Telephone Encounter (Signed)
Medication Refill - Medication: LINZESS 290 MCG CAPS capsule   Pt stated she is going out of town on Friday. She has been on this medication for many years. She is waiting on GI to fill; however, she is unsure why they denied her refill. Pt insisted I send the medication refill request to Dr. Alvis Lemmings to see if she agrees to fill just this time.  Has the patient contacted their pharmacy? No. (Agent: If no, request that the patient contact the pharmacy for the refill. If patient does not wish to contact the pharmacy document the reason why and proceed with request.)   Preferred Pharmacy (with phone number or street name):  [id number] [place], [place] - [address]  [place] [address]  Phone: [phone] Fax: [phone]  Hours: Not open 24 hours   Has the patient been seen for an appointment in the last year OR does the patient have an upcoming appointment? Yes.    Agent: Please be advised that RX refills may take up to 3 business days. We ask that you follow-up with your pharmacy.

## 2023-05-08 NOTE — Telephone Encounter (Signed)
Unable to refill per protocol, Rx request was refilled 05/07/23 for 30 and 3 refills. Patient needs OV for additional refills.  Requested Prescriptions  Pending Prescriptions Disp Refills   linaclotide (LINZESS) 290 MCG CAPS capsule 30 capsule 0     Gastroenterology: Irritable Bowel Syndrome Passed - 05/07/2023  2:22 PM      Passed - Valid encounter within last 12 months    Recent Outpatient Visits           2 weeks ago Myalgia   [facility] [place], Odette Horns, MD   6 months ago Brain fog   [facility] Hoy Register, MD   9 months ago Aortic atherosclerosis [facility])   [facility] Hoy Register, MD   1 year ago Recurrent pyelonephritis   [facility] Hoy Register, MD   1 year ago Irritable bowel syndrome with both constipation and diarrhea   [facility] Hoy Register, MD       Future Appointments             Tomorrow O[place]urf, Lyman Speller, PA-C [facility] [place], [place]   In 4 months Selena Batten Jamesetta So, DO [facility]  at [place]   In 5 months Hoy Register, MD [facility]
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
## 2023-05-09 ENCOUNTER — Encounter (HOSPITAL_BASED_OUTPATIENT_CLINIC_OR_DEPARTMENT_OTHER): Payer: Self-pay | Admitting: Student

## 2023-05-09 ENCOUNTER — Ambulatory Visit (HOSPITAL_BASED_OUTPATIENT_CLINIC_OR_DEPARTMENT_OTHER): Payer: Medicaid Other | Admitting: Student

## 2023-05-09 ENCOUNTER — Ambulatory Visit (INDEPENDENT_AMBULATORY_CARE_PROVIDER_SITE_OTHER): Payer: Medicaid Other

## 2023-05-09 DIAGNOSIS — J309 Allergic rhinitis, unspecified: Secondary | ICD-10-CM

## 2023-05-09 DIAGNOSIS — M25362 Other instability, left knee: Secondary | ICD-10-CM

## 2023-05-09 NOTE — Progress Notes (Signed)
Chief Complaint: Left knee pain     History of Present Illness:    Rachel [name] is a 37 y.o. female presenting today for follow-up of left knee pain and patellar instability.  She has been doing aquatic therapy 1-2 times per week and reports that she has been seeing improvements.  States that her knee extension is much better than at last visit but she still lacks some strength in order to get to completely straighten.  Does still have feeling that the kneecap is unstable while weightbearing on the pool.  Does carry a cane around with her for added support if needed.   Surgical History:   None of left knee  PMH/PSH/Family History/Social History/Meds/Allergies:    Past Medical History:  Diagnosis Date   ADHD (attention deficit hyperactivity disorder)    Anemia    Anxiety    Asthma    Bipolar disorder (HCC)    Complication of anesthesia    slow to wake up    Delayed gastric emptying 02/23/2020   Depression    DUB (dysfunctional uterine bleeding) 02/23/2020   Eczema    Ehlers-Danlos syndrome    Endometriosis    Gastroparesis    GERD (gastroesophageal reflux disease)    Heart murmur    Hiatal hernia 04/06/2020   Myalgia 04/06/2020   Nausea & vomiting 09/22/2022   Parasitic infection 02/23/2020   Pneumonia    Poor appetite 02/23/2020   Recurrent upper respiratory infection (URI)    RUQ pain 02/23/2020   Vitamin D deficiency    Weight loss 02/23/2020   Past Surgical History:  Procedure Laterality Date   barium study     COLONOSCOPY     CYSTOSCOPY N/A 07/12/2021   Procedure: CYSTOSCOPY;  Surgeon: Warden Fillers, MD;  Location: [facility] OR;  Service: Gynecology;  Laterality: N/A;   ESOPHAGOGASTRODUODENOSCOPY  2011   and a ph study as well.    LAPAROSCOPIC GASTRIC RESECTION N/A 01/22/2023   Procedure: LAPAROSCOPIC GASTRIC PEXY;  Surgeon: Quentin Ore, MD;  Location: [facility];  Service: General;  Laterality: N/A;    LAPAROSCOPY N/A 01/26/2021   Procedure: LAPAROSCOPY DIAGNOSTIC;  Surgeon: Warden Fillers, MD;  Location: [facility];  Service: Gynecology;  Laterality: N/A;   NASAL SEPTUM SURGERY     SINOSCOPY     TONGUE FLAP     TOTAL LAPAROSCOPIC HYSTERECTOMY WITH SALPINGECTOMY Bilateral 07/12/2021   Procedure: TOTAL LAPAROSCOPIC HYSTERECTOMY WITH SALPINGECTOMY, OOPHORECTOMY;  Surgeon: Warden Fillers, MD;  Location: [facility] OR;  Service: Gynecology;  Laterality: Bilateral;   UPPER GASTROINTESTINAL ENDOSCOPY     WISDOM TOOTH EXTRACTION     Social History   Socioeconomic History   Marital status: Divorced    Spouse name: Not on file   Number of children: Not on file   Years of education: Not on file   Highest education level: Not on file  Occupational History   Not on file  Tobacco Use   Smoking status: Never    Passive exposure: Never   Smokeless tobacco: Never  Vaping Use   Vaping Use: Never used  Substance and Sexual Activity   Alcohol use: Yes    Alcohol/week: 1.0 standard drink of alcohol    Types: 1 Glasses of wine per week    Comment: 1 glass of  wine every other week   Drug use: No   Sexual activity: Not Currently  Other Topics Concern   Not on file  Social History Narrative   Not on file   Social Determinants of Health   Financial Resource Strain: Not on file  Food Insecurity: No Food Insecurity (01/22/2023)   Hunger Vital Sign    Worried About Running Out of Food in the Last Year: Never true    Ran Out of Food in the Last Year: Never true  Transportation Needs: No Transportation Needs (01/22/2023)   PRAPARE - Administrator, Civil Service (Medical): No    Lack of Transportation (Non-Medical): No  Physical Activity: Not on file  Stress: Not on file  Social Connections: Not on file   Family History  Problem Relation Age of Onset   Eczema Mother    Urticaria Mother    Allergic rhinitis Mother    Colon polyps Mother    Depression Mother     Anxiety disorder Mother    Hypertension Mother    Hyperlipidemia Mother    Asthma Mother    Endometriosis Mother    Fibroids Mother    Fibromyalgia Mother    Migraines Mother    Irritable bowel syndrome Mother    Allergic rhinitis Father    Depression Father    Hypertension Father    Hyperlipidemia Father    Bipolar disorder Father    Urticaria Sister    Allergic rhinitis Sister    Bipolar disorder Sister    ADD / ADHD Sister    Asthma Sister    Endometriosis Sister    Seizures Sister    Bipolar disorder Sister    Anxiety disorder Sister    Heart disease Maternal Grandmother    Endometriosis Maternal Grandmother    Fibroids Maternal Grandmother    Hyperlipidemia Maternal Grandmother    Hypertension Maternal Grandmother    Diabetes Maternal Grandmother    Fibromyalgia Maternal Grandmother    Heart disease Maternal Grandfather    Diabetes Maternal Grandfather    Heart disease Paternal Grandmother    Heart disease Paternal Grandfather    Colon cancer Maternal Great-grandmother    Pancreatic cancer Neg Hx    Esophageal cancer Neg Hx    Stomach cancer Neg Hx    Allergies  Allergen Reactions   Amitriptyline Anaphylaxis   Amphetamine-Dextroamphetamine Other (See Comments)    Triggered bipolar, caused depression, and suicidal thoughts   Lupron [Leuprolide] Anaphylaxis   Orilissa [Elagolix] Anaphylaxis   Penicillins Anaphylaxis, Swelling and Other (See Comments)    Potential for anaphylaxis confirmed. Tolerates Cefphalosporins No "-cillins"!!!! Did it involve swelling of the face/tongue/throat, SOB, or low BP? Yes Did it involve sudden or severe rash/hives, skin peeling, or any reaction on the inside of your mouth or nose? Unknown Did you need to seek medical attention at a hospital or doctor's office? Unknown When did it last happen? teenager      If all above answers are "NO", may proceed with cephalosporin use.   Wound Dressing Adhesive Rash   Betadine  [Povidone-Iodine] Swelling and Rash   Chlorhexidine Hives    DuraPrep    Doxycycline Itching, Nausea And Vomiting and Other (See Comments)    Caused high fever, also   Mixed Ragweed Other (See Comments)    Unknown, Mother and pt state she can't have    Olanzapine Other (See Comments)    Caused mania   Other Other (See Comments)    Stimulants,  mood stabilizers, and antidepressants = Triggered bipolar, caused depression, and suicidal thoughts   Prozac [Fluoxetine Hcl] Other (See Comments)    Triggered bipolar, caused depression, and suicidal thoughts   Stevia Glycerite Extract [Flavoring Agent] Other (See Comments)    Unknown reaction; mother and pt confirm any Artificial Sweeteners cause reaction   Tape Other (See Comments)    Paper tape is tolerated   Triple Antibiotic Pain Relief [Neomy-Bacit-Polymyx-Pramoxine] Hives   Reglan [Metoclopramide] Anxiety and Other (See Comments)    Triggered bipolar, caused depression, suicidal thoughts, muscle twitching, stiffness, and high anxiety    Current Outpatient Medications  Medication Sig Dispense Refill   acetaminophen (TYLENOL) 500 MG tablet Take 500-1,000 mg by mouth every 6 (six) hours as needed for moderate pain or mild pain.     albuterol (PROVENTIL) (2.5 MG/3ML) 0.083% nebulizer solution Take 3 mLs (2.5 mg total) by nebulization every 4 (four) hours as needed for wheezing or shortness of breath (coughing fits). 75 mL 1   albuterol (VENTOLIN HFA) 108 (90 Base) MCG/ACT inhaler Inhale 2 puffs into the lungs every 4 (four) hours as needed for wheezing or shortness of breath (coughing fits). 18 g 1   atorvastatin (LIPITOR) 20 MG tablet Take 1 tablet (20 mg total) by mouth daily. 90 tablet 1   B Complex Vitamins (B COMPLEX PO) Take 1 capsule by mouth every evening.     Calcium-Magnesium-Vitamin D 600-40-500 MG-MG-UNIT TB24 Take 1 tablet by mouth every evening.     cromolyn (OPTICROM) 4 % ophthalmic solution Place 1 drop into both eyes 4 (four)  times daily as needed (itchy/watery eyes). 10 mL 3   diphenhydrAMINE (BENADRYL) 25 MG tablet Take 25 mg by mouth every 6 (six) hours as needed for allergies or itching.     EPINEPHrine 0.3 mg/0.3 mL IJ SOAJ injection Inject 0.3 mg into the muscle as needed for anaphylaxis. 2 each 1   ferrous sulfate 325 (65 FE) MG tablet TAKE 1 TABLET BY MOUTH DAILY 60 tablet 2   fexofenadine (ALLEGRA) 180 MG tablet Take 180 mg by mouth every evening.     ibuprofen (ADVIL) 200 MG tablet Take 400-800 mg by mouth every 8 (eight) hours as needed (pain.).     ketorolac (ACULAR) 0.5 % ophthalmic solution Place 1 drop into the right eye 4 (four) times daily. 3 mL 0   linaclotide (LINZESS) 290 MCG CAPS capsule TAKE 1 CAPSULE BY MOUTH DAILY BEFORE BREAKFAST 30 capsule 3   Melatonin 10 MG TABS Take 10 mg by mouth at bedtime.     methocarbamol (ROBAXIN-750) 750 MG tablet Take 1 tablet (750 mg total) by mouth 4 (four) times daily. 30 tablet 0   MIRALAX 17 GM/SCOOP powder Take 17-34 g by mouth daily. Mix 17 g of powder into water and drink once a day     Misc. Devices MISC Shower chair Dx- Arthralgia 1 each 0   Misc. Devices [employer] Dx - arthralgia 1 each 0   Olopatadine HCl (PATADAY) 0.2 % SOLN Place 1-2 drops into both eyes 3 (three) times daily as needed (allergies eye).     Omega-3 Fatty Acids (FISH OIL) 1200 MG CAPS Take 1,200 mg by mouth daily.     oxyCODONE-acetaminophen (PERCOCET) 5-325 MG tablet Take 1 tablet by mouth every 4 (four) hours as needed for severe pain. 20 tablet 0   PREMARIN 0.625 MG tablet TAKE 1 TABLET BY MOUTH DAILY TAKE FOR 21 DAYS THEN DO NOT TAKE FOR 7 DAYS 30  tablet 11   promethazine (PHENERGAN) 12.5 MG tablet Take 1 tablet (12.5 mg total) by mouth every 8 (eight) hours as needed for nausea. 30 tablet 1   rizatriptan (MAXALT-MLT) 10 MG disintegrating tablet May repeat in 2 hours if needed 10 tablet 5   sennosides-docusate sodium (SENOKOT-S) 8.6-50 MG tablet Take 2-3 tablets by mouth daily as  needed for constipation.     sulfacetamide (BLEPH-10) 10 % ophthalmic solution Place 1-2 drops into the right eye every 3 (three) hours. Use while awake (Patient taking differently: Place 1-2 drops into the right eye every 3 (three) hours as needed (eye irritation.). Use while awake) 5 mL 0   triamcinolone cream (KENALOG) 0.1 % Apply 1 Application topically 2 (two) times daily as needed (rash, itching). 45 g 1   VITAMIN A PO Take 3,000 mcg by mouth every evening.     zinc sulfate 220 (50 Zn) MG capsule Take 220 mg by mouth every evening.     No current facility-administered medications for this visit.   No results found.  Review of Systems:   A ROS was performed including pertinent positives and negatives as documented in the HPI.  Physical Exam :   Constitutional: NAD and appears stated age Neurological: Alert and oriented Psych: Appropriate affect and cooperative There were no vitals taken for this visit.   Comprehensive Musculoskeletal Exam:    Active range of motion of the left knee is from 20 degrees extension to 110 degrees flexion.  Can passively extend to 0 degrees.  Motion toward terminal flexion does cause discomfort.  No crepitus noted.  No instability with varus or valgus stress.   Imaging:     Assessment:   37 y.o. female with history of EDS and patellar instability.  She has been seeing positive improvements with aquatic therapy so at this point I would recommend continuing with this as well as adding in land therapy as tolerated.  Does still have some strength deficits of the quad and still lacks about 20 degrees of extension however this has been improving over the last few weeks.  Plan to continue strengthening program with physical therapy and will have her come back to clinic for reassessment as needed.   Plan :    -Continue physical therapy and return to clinic as needed     I personally saw and evaluated the patient, and participated in the management and  treatment plan.  Hazle Nordmann, PA-C Orthopedics  This document was dictated using Conservation officer, historic buildings. A reasonable attempt at proof reading has been made to minimize errors.

## 2023-05-15 ENCOUNTER — Ambulatory Visit (HOSPITAL_BASED_OUTPATIENT_CLINIC_OR_DEPARTMENT_OTHER): Payer: Medicaid Other | Admitting: Physical Therapy

## 2023-05-16 NOTE — Progress Notes (Signed)
Order(s) created erroneously. Erroneous order ID: [id number]  Order moved by: CHART CORRECTION ANALYST, FIFTEEN  Order move date/time: 05/16/2023 10:46 AM  Source Patient: [id number]  Source Contact: 04/23/2023  Destination Patient: [id number]  Destination Contact: 04/24/2022

## 2023-05-16 NOTE — Progress Notes (Signed)
Order(s) created erroneously. Erroneous order ID: [id number]  Order moved by: CHART CORRECTION ANALYST, FIFTEEN  Order move date/time: 05/16/2023 10:45 AM  Source Patient: [id number]  Source Contact: 04/23/2023  Destination Patient: [id number]  Destination Contact: 04/24/2022 

## 2023-05-18 ENCOUNTER — Ambulatory Visit (HOSPITAL_BASED_OUTPATIENT_CLINIC_OR_DEPARTMENT_OTHER): Payer: Medicaid Other | Admitting: Physical Therapy

## 2023-05-18 DIAGNOSIS — R339 Retention of urine, unspecified: Secondary | ICD-10-CM | POA: Diagnosis not present

## 2023-05-21 ENCOUNTER — Ambulatory Visit (INDEPENDENT_AMBULATORY_CARE_PROVIDER_SITE_OTHER): Payer: Medicaid Other | Admitting: Clinical

## 2023-05-21 DIAGNOSIS — Z658 Other specified problems related to psychosocial circumstances: Secondary | ICD-10-CM

## 2023-05-21 DIAGNOSIS — F430 Acute stress reaction: Secondary | ICD-10-CM | POA: Diagnosis not present

## 2023-05-21 DIAGNOSIS — F4310 Post-traumatic stress disorder, unspecified: Secondary | ICD-10-CM

## 2023-05-21 DIAGNOSIS — F909 Attention-deficit hyperactivity disorder, unspecified type: Secondary | ICD-10-CM

## 2023-05-21 NOTE — Patient Instructions (Signed)
Center for [facility] [address] [place], [place] [address] [phone] (main office) [phone] [facility] office)  Heritage [email or web address]:  [address], 2nd floor, [place], [place] [address] [phone])  Main line [phone]  [place] location **Parking is available in the [address] parking deck.  [place]:  [address], [place], [place] [address] [phone]) Main line [phone]  [place] location  **Located on the backside of the [facility]. Limited parking is available off of [address]  [place] hours: Monday -Friday 8:30am-4:30pm  [email or web address]    If immediate emergency, call 9-1-1, or [facility] the [place] 24 hour crisis hotline at: [phone]
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
## 2023-05-23 ENCOUNTER — Ambulatory Visit (HOSPITAL_BASED_OUTPATIENT_CLINIC_OR_DEPARTMENT_OTHER): Payer: Medicaid Other | Admitting: Physical Therapy

## 2023-05-24 ENCOUNTER — Telehealth: Payer: Self-pay | Admitting: Clinical

## 2023-05-24 ENCOUNTER — Telehealth: Payer: Self-pay | Admitting: Obstetrics and Gynecology
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
## 2023-05-29 ENCOUNTER — Ambulatory Visit (INDEPENDENT_AMBULATORY_CARE_PROVIDER_SITE_OTHER): Payer: Medicaid Other | Admitting: Clinical

## 2023-05-29 DIAGNOSIS — F430 Acute stress reaction: Secondary | ICD-10-CM

## 2023-05-29 DIAGNOSIS — F909 Attention-deficit hyperactivity disorder, unspecified type: Secondary | ICD-10-CM

## 2023-05-29 DIAGNOSIS — F4310 Post-traumatic stress disorder, unspecified: Secondary | ICD-10-CM

## 2023-05-29 DIAGNOSIS — Z658 Other specified problems related to psychosocial circumstances: Secondary | ICD-10-CM

## 2023-05-30 ENCOUNTER — Ambulatory Visit (HOSPITAL_BASED_OUTPATIENT_CLINIC_OR_DEPARTMENT_OTHER): Payer: Medicaid Other | Admitting: Physical Therapy

## 2023-05-31 ENCOUNTER — Telehealth: Payer: Self-pay | Admitting: Clinical
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
## 2023-06-04 ENCOUNTER — Encounter (HOSPITAL_BASED_OUTPATIENT_CLINIC_OR_DEPARTMENT_OTHER): Payer: Medicaid Other | Admitting: Physical Therapy

## 2023-06-04 ENCOUNTER — Ambulatory Visit: Payer: Medicaid Other | Admitting: Obstetrics and Gynecology

## 2023-06-06 ENCOUNTER — Ambulatory Visit (HOSPITAL_BASED_OUTPATIENT_CLINIC_OR_DEPARTMENT_OTHER): Payer: Medicaid Other | Admitting: Physical Therapy

## 2023-06-13 ENCOUNTER — Other Ambulatory Visit: Payer: Self-pay | Admitting: Family Medicine

## 2023-06-13 DIAGNOSIS — D509 Iron deficiency anemia, unspecified: Secondary | ICD-10-CM

## 2023-06-14 ENCOUNTER — Ambulatory Visit (INDEPENDENT_AMBULATORY_CARE_PROVIDER_SITE_OTHER): Payer: Medicaid Other | Admitting: Clinical

## 2023-06-14 ENCOUNTER — Ambulatory Visit (HOSPITAL_BASED_OUTPATIENT_CLINIC_OR_DEPARTMENT_OTHER): Payer: Medicaid Other | Admitting: Physical Therapy

## 2023-06-14 DIAGNOSIS — F4310 Post-traumatic stress disorder, unspecified: Secondary | ICD-10-CM | POA: Diagnosis not present

## 2023-06-14 DIAGNOSIS — F430 Acute stress reaction: Secondary | ICD-10-CM | POA: Insufficient documentation

## 2023-06-14 DIAGNOSIS — Z658 Other specified problems related to psychosocial circumstances: Secondary | ICD-10-CM

## 2023-06-14 DIAGNOSIS — F909 Attention-deficit hyperactivity disorder, unspecified type: Secondary | ICD-10-CM

## 2023-06-14 NOTE — Patient Instructions (Signed)
Center for [facility] at [facility] for Women [address] [place], [place] [address] [phone] (main office) [phone] [facility] office)   [email or web address] [phone]  [facility]  [address], [place], [place] [address] [phone] [email or web address]  [facility]:  [address], 2nd floor, [place], [place] [address] [phone])  Main line [phone]  [place] location **Parking is available in the [address] parking deck.  [place]:  [address], [place], [place] [address] [phone]) Main line [phone]  [place] location  **Located on the backside of the [facility]. Limited parking is available off of [address]  [place] hours: Monday -Friday 8:30am-4:30pm  [email or web address]    If immediate emergency, call 9-1-1, or [facility] [place] 24 hour crisis hotline at: [phone]

## 2023-06-18 NOTE — BH Specialist Note (Unsigned)
Integrated Behavioral Health via Telemedicine Visit  06/18/2023 Malanie Biagi [id number]  Number of Integrated Behavioral Health Clinician visits: Additional Visit  Session Start time: 1107   Session End time: 1138  Total time in minutes: 31   Referring Provider: Mariel Aloe, MD Patient/Family location: Home*** [facility] Provider location: Center for [facility] at [facility] for Women  All persons participating in visit: Patient *** and [facility]   ***  Types of Service: {CHL AMB TYPE OF [phone]}  I connected with [name] and/or [name] Rebuck's {family members:20773} via  Telephone or Engineer, civil (consulting)  (Video is Surveyor, mining) and verified that I am speaking with the correct person using two identifiers. Discussed confidentiality: Yes   I discussed the limitations of telemedicine and the availability of in person appointments.  Discussed there is a possibility of technology failure and discussed alternative modes of communication if that failure occurs.  I discussed that engaging in this telemedicine visit, they consent to the provision of behavioral healthcare and the services will be billed under their insurance.  Patient and/or legal guardian expressed understanding and consented to Telemedicine visit: Yes   Presenting Concerns: Patient and/or family reports the following symptoms/concerns: *** Duration of problem: ***; Severity of problem: {Mild/Moderate/Severe:20260}  Patient and/or Family's Strengths/Protective Factors: {CHL AMB BH PROTECTIVE FACTORS:[phone]}  Goals Addressed: Patient will:  Reduce symptoms of: {IBH Symptoms:21014056}   Increase knowledge and/or ability of: {IBH Patient Tools:21014057}   Demonstrate ability to: {IBH Goals:21014053}  Progress towards Goals: {CHL AMB BH PROGRESS TOWARDS GOALS:5030319678}  Interventions: Interventions  utilized:  {IBH Interventions:21014054} Standardized Assessments completed: {IBH Screening Tools:21014051}  Patient and/or Family Response: Patient agrees with treatment plan. ***  Assessment: Patient currently experiencing ***.   Patient may benefit from continued therapeutic intervention *** .  Plan: Follow up with behavioral health clinician on : *** Behavioral recommendations:  -*** -*** Referral(s): {IBH Referrals:21014055}  I discussed the assessment and treatment plan with the patient and/or parent/guardian. They were provided an opportunity to ask questions and all were answered. They agreed with the plan and demonstrated an understanding of the instructions.   They were advised to call back or seek an in-person evaluation if the symptoms worsen or if the condition fails to improve as anticipated.  [name] , LCSW     04/23/2023    8:50 AM 11/02/2022    9:23 AM 08/02/2022    2:20 PM 08/02/2022    2:11 PM 05/02/2022    2:26 PM  Depression screen PHQ 2/9  Decreased Interest 0 0 0 0 0  Down, Depressed, Hopeless 1 1 0 0 0  PHQ - 2 Score 1 1 0 0 0  Altered sleeping 2 1 2  2   Tired, decreased energy 2 1 2  1   Change in appetite 2 1 1  2   Feeling bad or failure about yourself  0 0 0  0  Trouble concentrating 1 0 1  1  Moving slowly or fidgety/restless 0 0 0  0  Suicidal thoughts 0 0 0  0  PHQ-9 Score 8 4 6  6       04/23/2023    8:51 AM 11/02/2022    9:24 AM 08/02/2022    2:20 PM 05/02/2022    2:26 PM  GAD 7 : Generalized Anxiety Score  Nervous, Anxious, on Edge 2 1 0 0  Control/stop worrying 1 0 0 0  Worry too much - different things 0 0 0 0  Trouble relaxing 0 1 1 2   Restless 0 0 0 0  Easily annoyed or irritable 0 0 0 1  Afraid - awful might happen 0 0 0 0  Total GAD 7 Score 3 2 1  3

## 2023-06-21 ENCOUNTER — Other Ambulatory Visit: Payer: Self-pay | Admitting: Gastroenterology

## 2023-06-26 ENCOUNTER — Other Ambulatory Visit: Payer: Self-pay | Admitting: Gastroenterology

## 2023-06-26 ENCOUNTER — Ambulatory Visit (INDEPENDENT_AMBULATORY_CARE_PROVIDER_SITE_OTHER): Payer: Medicaid Other | Admitting: Clinical

## 2023-06-26 DIAGNOSIS — Z658 Other specified problems related to psychosocial circumstances: Secondary | ICD-10-CM

## 2023-06-26 DIAGNOSIS — F4310 Post-traumatic stress disorder, unspecified: Secondary | ICD-10-CM | POA: Diagnosis not present

## 2023-06-26 DIAGNOSIS — F909 Attention-deficit hyperactivity disorder, unspecified type: Secondary | ICD-10-CM

## 2023-06-26 NOTE — Patient Instructions (Addendum)
Center for [facility] Healthcare at [facility] for Women [address] [place], [place] [address] [phone] (main office) [phone] ('s office)   "Don't walk behind me; I may not lead. Don't walk in front of me; I may not follow. Just walk beside me and be my friend." [employer]  A Quick Look at Your Values (Copyright [employer], 2010, [email or web address] )  Values are your heart's deepest desires for how you want to behave as a human being. Values are not about what you want to get or achieve; they are about how you want to behave or act on an ongoing basis; how you want to treat yourself, others, the world around you. There are literally hundreds of different values, but below you'll find a list of the most common ones. Probably, not all of them will be relevant to you. Keep in mind there are no such things as 'right values' or 'wrong values'. It's a bit like our taste in pizzas. If you prefer ham and pineapple but I prefer salami and olives, that doesn't mean that my taste in pizzas is right and yours is wrong. It just means we have different tastes. And similarly, we may have different values. So pick a domain of life that you want to improve, and read through the list below and write a letter next to each value: V = Very important, Q = Quite important, and N = Not so important - for the specific domain of life you have picked to work on.  1. Acceptance: to be open to and accepting of myself, others, life etc 2. Adventure: to be adventurous; to actively seek, create, or explore novel or stimulating experiences 3. Assertiveness: to respectfully stand up for my rights and request what I want 4. Authenticity: to be authentic, genuine, real; to be true to myself 5. Beauty: to appreciate, create, nurture or cultivate beauty in myself, others, the environment etc 6. Caring: to be caring towards myself, others, the environment etc 7. Challenge: to keep challenging myself to  grow, learn, improve 8. Compassion: to act with kindness towards those who are suffering 9. Connection: to engage fully in whatever I am doing, and be fully present with others 10. Contribution: to contribute, help, assist, or make a positive difference to myself or others 11. Conformity: to be respectful and obedient of rules and obligations 12. Cooperation: to be cooperative and collaborative with others 13. Courage: to be courageous or brave; to persist in the face of fear, threat, or difficulty 14. Creativity: to be creative or innovative 15. Curiosity: to be curious, open-minded and interested; to explore and discover 16. Encouragement: to encourage and reward behaviour that I value in myself or others 17. Equality: to treat others as equal to myself, and vice-versa 18. Excitement: to seek, create and engage in activities that are exciting, stimulating or thrilling 19. Fairness: to be fair to myself or others 20. Fitness: to maintain or improve my fitness; to look after my physical and mental health and wellbeing 21. Flexibility: to adjust and adapt readily to changing circumstances 22. Freedom: to live freely; to choose how I live and behave, or help others do likewise 23. Friendliness: to be friendly, companionable, or agreeable towards others 24. Forgiveness: to be forgiving towards myself or others 25. Fun: to be fun-loving; to seek, create, and engage in fun-filled activities 26. Generosity: to be generous, sharing and giving, to myself or others 27. Gratitude: to be grateful for and appreciative of the positive aspects of  myself, others and life 28. Honesty: to be honest, truthful, and sincere with myself and others 29. Humour: to see and appreciate the humorous side of life 30. Humility: to be humble or modest; to let my achievements speak for themselves 31. Industry: to be industrious, hard-working, dedicated 32. Independence: to be self-supportive, and choose my own way of  doing things 33. Intimacy: to open up, reveal, and share myself -- emotionally or physically - in my close personal relationships 25. Justice: to uphold justice and fairness 35. Kindness: to be kind, compassionate, considerate, nurturing or caring towards myself or others 18. Love: to act lovingly or affectionately towards myself or others 37. Mindfulness: to be conscious of, open to, and curious about my here-and-now experience 75. Order: to be orderly and organized 40. Open-mindedness: to think things through, see things from other's points of view 40. Patience: to wait calmly for what I want 41. Persistence: to continue resolutely, despite problems or difficulties. 42. Pleasure: to create and give pleasure to myself or others 43. Power: to strongly influence or wield authority over others, e.g. taking charge, leading, organizing 44. Reciprocity: to build relationships in which there is a fair balance of giving and taking 45. Respect: to be respectful towards myself or others; to be polite, considerate and show positive regard 46. Responsibility: to be responsible and accountable for my actions 47. Romance: to be romantic; to display and express love or strong affection 48. Safety: to secure, protect, or ensure safety of myself or others 49. Self-awareness: to be aware of my own thoughts, feelings and actions 50. Self-care: to look after my health and wellbeing, and get my needs met 51. Self-development: to keep growing, advancing or improving in knowledge, skills, character, or life experience. 52. Self-control: to act in accordance with my own ideals 42. Sensuality: to create, explore and enjoy experiences that stimulate the five senses 54. Sexuality: to explore or express my sexuality 53. Spirituality: to connect with things bigger than myself 56. Skilfulness: to continually practice and improve my skills, and apply myself fully 56. Skilfulness: to continually practice and improve  my skills, and apply myself fully when using them 57. Supportiveness: to be supportive, helpful, encouraging, and available to myself or others 58. Trust: to be trustworthy; to be loyal, faithful, sincere, and reliable 59. Insert your own unlisted value here: 60. Insert your own unlisted value here:  Once you've marked each value as V, Q, N (Very, Quite, or Not so important), go through all the Vs, and select out the top three that are most important to you in this domain of life, at this point in time. The next step is to start looking at ways to live these values, in this area of life; things you can say and do, guided by these values
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
## 2023-07-11 ENCOUNTER — Encounter: Payer: Self-pay | Admitting: Gastroenterology

## 2023-07-11 ENCOUNTER — Ambulatory Visit: Payer: Medicaid Other | Admitting: Gastroenterology

## 2023-07-11 VITALS — BP 98/70 | HR 74 | Ht 62.5 in | Wt 109.4 lb

## 2023-07-11 DIAGNOSIS — K5904 Chronic idiopathic constipation: Secondary | ICD-10-CM | POA: Diagnosis not present

## 2023-07-11 DIAGNOSIS — K3184 Gastroparesis: Secondary | ICD-10-CM | POA: Diagnosis not present

## 2023-07-11 MED ORDER — LINACLOTIDE 290 MCG PO CAPS: 290.0000 ug | ORAL_CAPSULE | Freq: Every day | ORAL | 3 refills | Status: DC

## 2023-07-11 MED ORDER — TRULANCE 3 MG PO TABS: 3.0000 mg | ORAL_TABLET | Freq: Every day | ORAL | 3 refills | Status: DC
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
## 2023-07-11 NOTE — Patient Instructions (Signed)
We have sent the following medications to your pharmacy for you to pick up at your convenience: Trulance  Linzess 290 mg  Eat small frequent meals  _______________________________________________________  If your blood pressure at your visit was 140/90 or greater, please contact your primary care physician to follow up on this.  _______________________________________________________  If you are age 36 or older, your body mass index should be between 23-30. Your Body mass index is 19.69 kg/m. If this is out of the aforementioned range listed, please consider follow up with your Primary Care Provider.  If you are age 36 or younger, your body mass index should be between 19-25. Your Body mass index is 19.69 kg/m. If this is out of the aformentioned range listed, please consider follow up with your Primary Care Provider.   ________________________________________________________  The [facility] providers would like to encourage you to use [facility] to communicate with providers for non-urgent requests or questions.  Due to long hold times on the telephone, sending your provider a message by [facility] may be a faster and more efficient way to get a response.  Please allow 48 business hours for a response.  Please remember that this is for non-urgent requests.  _______________________________________________________   I appreciate the  opportunity to care for you  Thank You   Marsa Aris , MD

## 2023-07-12 ENCOUNTER — Ambulatory Visit (INDEPENDENT_AMBULATORY_CARE_PROVIDER_SITE_OTHER): Payer: Medicaid Other | Admitting: Clinical

## 2023-07-12 DIAGNOSIS — Z658 Other specified problems related to psychosocial circumstances: Secondary | ICD-10-CM

## 2023-07-12 DIAGNOSIS — F4310 Post-traumatic stress disorder, unspecified: Secondary | ICD-10-CM

## 2023-07-12 DIAGNOSIS — F909 Attention-deficit hyperactivity disorder, unspecified type: Secondary | ICD-10-CM
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
## 2023-07-24 ENCOUNTER — Encounter: Payer: Self-pay | Admitting: Gastroenterology

## 2023-08-01 ENCOUNTER — Ambulatory Visit (INDEPENDENT_AMBULATORY_CARE_PROVIDER_SITE_OTHER): Payer: Medicaid Other | Admitting: Clinical

## 2023-08-01 DIAGNOSIS — Z658 Other specified problems related to psychosocial circumstances: Secondary | ICD-10-CM

## 2023-08-01 DIAGNOSIS — F4310 Post-traumatic stress disorder, unspecified: Secondary | ICD-10-CM

## 2023-08-01 DIAGNOSIS — F909 Attention-deficit hyperactivity disorder, unspecified type: Secondary | ICD-10-CM

## 2023-08-01 NOTE — Patient Instructions (Signed)
[facility] [address] [place], [place] [address] [phone] (main office) [phone] (Aquan Kope's office)   

## 2023-08-06 NOTE — BH Specialist Note (Addendum)
Integrated Behavioral Health via Telemedicine Visit  09/06/2023 Rachel [name] [id number]  Number of Integrated Behavioral Health Clinician visits: Additional Visit  Session Start time: 0915   Session End time: 1015  Total time in minutes: 60   Referring Provider: Mariel Aloe, MD Patient/Family location: Home [facility] Provider location: [facility]  All persons participating in visit: [name] and Rachel [name]   Types of Service: Individual psychotherapy and Telephone visit  I connected with Rachel [name]'s  n/a  via  Telephone or Video Enabled Telemedicine Application  (Video is Caregility application) and verified that I am speaking with the correct person using two identifiers. Discussed confidentiality: Yes   I discussed the limitations of telemedicine and the availability of in person appointments.  Discussed there is a possibility of technology failure and discussed alternative modes of communication if that failure occurs.  I discussed that engaging in this telemedicine visit, they consent to the provision of behavioral healthcare and the services will be billed under their insurance.  Patient and/or legal guardian expressed understanding and consented to Telemedicine visit: Yes   Presenting Concerns: Patient and/or family reports the following symptoms/concerns: Processing complicated housing and relationship issues.  Duration of problem: ongoing; Severity of problem: moderate  Patient and/or Family's Strengths/Protective Factors: Social connections, Concrete supports in place (healthy food, safe environments, etc.), and Sense of purpose  Goals Addressed: Patient will:  Reduce symptoms of: anxiety, depression, and stress   Increase knowledge and/or ability of: stress reduction   Demonstrate ability to: Increase healthy adjustment to  current life circumstances  Progress towards Goals: Ongoing  Interventions: Interventions utilized:  Supportive Reflection Standardized Assessments completed: Not Needed  Patient and/or Family Response: Patient agrees with treatment plan.   Assessment: Patient currently experiencing PTSD; ADHD, Psychosocial stress  Patient may benefit from continued therapeutic intervention  .  Plan: Follow up with behavioral health clinician on : Two weeks Behavioral recommendations:  -Continue daily self-coping strategies (priority: healthy boundaries) -Enjoy upcoming weekend trip Referral(s): Integrated [employer] (In Clinic)  I discussed the assessment and treatment plan with the patient and/or parent/guardian. They were provided an opportunity to ask questions and all were answered. They agreed with the plan and demonstrated an understanding of the instructions.   They were advised to call back or seek an in-person evaluation if the symptoms worsen or if the condition fails to improve as anticipated.  [name] Britzy Graul, LCSW     04/23/2023    8:50 AM 11/02/2022    9:23 AM 08/02/2022    2:20 PM 08/02/2022    2:11 PM 05/02/2022    2:26 PM  Depression screen PHQ 2/9  Decreased Interest 0 0 0 0 0  Down, Depressed, Hopeless 1 1 0 0 0  PHQ - 2 Score 1 1 0 0 0  Altered sleeping 2 1 2  2   Tired, decreased energy 2 1 2  1   Change in appetite 2 1 1  2   Feeling bad or failure about yourself  0 0 0  0  Trouble concentrating 1 0 1  1  Moving slowly or fidgety/restless 0 0 0  0  Suicidal thoughts 0 0 0  0  PHQ-9 Score 8 4 6  6       04/23/2023    8:51 AM 11/02/2022    9:24 AM 08/02/2022    2:20 PM 05/02/2022    2:26 PM  GAD 7 : Generalized Anxiety Score  Nervous, Anxious, on Edge 2 1 0 0  Control/stop worrying 1 0 0 0  Worry too much - different things 0 0 0 0  Trouble relaxing 0 1 1 2   Restless 0 0 0 0  Easily annoyed or irritable 0 0 0 1  Afraid - awful might happen 0 0  0 0  Total GAD 7 Score 3 2 1  3

## 2023-08-17 ENCOUNTER — Ambulatory Visit: Payer: Medicaid Other | Admitting: Clinical

## 2023-08-17 DIAGNOSIS — F909 Attention-deficit hyperactivity disorder, unspecified type: Secondary | ICD-10-CM

## 2023-08-17 DIAGNOSIS — Z658 Other specified problems related to psychosocial circumstances: Secondary | ICD-10-CM

## 2023-08-17 DIAGNOSIS — F4310 Post-traumatic stress disorder, unspecified: Secondary | ICD-10-CM

## 2023-08-23 NOTE — BH Specialist Note (Signed)
Integrated Behavioral Health via Telemedicine Visit  09/06/2023 Rachel [name] [id number]  Number of Integrated Behavioral Health Clinician visits: Additional Visit  Session Start time: 1050   Session End time: 1150  Total time in minutes: 60   Referring Provider: Mariel Aloe, MD Patient/Family location: Home [facility] Provider location: [facility]  All persons participating in visit: [name] and [facility] Brendy Ficek   Types of Service: Individual psychotherapy and Telephone visit  I connected with Rachel [name] [name]'s  n/a  via  Telephone or Video Enabled Telemedicine Application  (Video is Caregility application) and verified that I am speaking with the correct person using two identifiers. Discussed confidentiality: Yes   I discussed the limitations of telemedicine and the availability of in person appointments.  Discussed there is a possibility of technology failure and discussed alternative modes of communication if that failure occurs.  I discussed that engaging in this telemedicine visit, they consent to the provision of behavioral healthcare and the services will be billed under their insurance.  Patient and/or legal guardian expressed understanding and consented to Telemedicine visit: Yes   Presenting Concerns: Patient and/or family reports the following symptoms/concerns: Ongoing stressful life events; processing past trauma and current thoughts and emotions as it relates to current relationships with others.  Duration of problem: Ongoing; Severity of problem: moderate  Patient and/or Family's Strengths/Protective Factors: Social connections, Concrete supports in place (healthy food, safe environments, etc.), and Sense of purpose  Goals Addressed: Patient will:  Reduce symptoms of: anxiety and stress   Increase knowledge and/or ability of: stress  reduction   Demonstrate ability to: Increase healthy adjustment to current life circumstances  Progress towards Goals: Ongoing  Interventions: Interventions utilized:  Supportive Reflection Standardized Assessments completed: Not Needed  Patient and/or Family Response: Patient agrees with treatment plan.  Assessment: Patient currently experiencing ADHD; PTSD; Psychosocial stress.   Patient may benefit from continued therapeutic intervention  .  Plan: Follow up with behavioral health clinician on : Four weeks Behavioral recommendations:  -Continue using daily self-coping strategies as needed, with priority on maintaining healthy boundaries in all relationships -Consider utilizing both a pros/cons list, as well as paying attention/being mindful of feelings and emotions that come up with specific people; begin to trust your intuition and step back physically and emotionally when something feels "off", as discussed Referral(s): Integrated [employer] (In Clinic)  I discussed the assessment and treatment plan with the patient and/or parent/guardian. They were provided an opportunity to ask questions and all were answered. They agreed with the plan and demonstrated an understanding of the instructions.   They were advised to call back or seek an in-person evaluation if the symptoms worsen or if the condition fails to improve as anticipated.  [name] Chanese Hartsough, LCSW     04/23/2023    8:50 AM 11/02/2022    9:23 AM 08/02/2022    2:20 PM 08/02/2022    2:11 PM 05/02/2022    2:26 PM  Depression screen PHQ 2/9  Decreased Interest 0 0 0 0 0  Down, Depressed, Hopeless 1 1 0 0 0  PHQ - 2 Score 1 1 0 0 0  Altered sleeping 2 1 2  2   Tired, decreased energy 2 1 2  1   Change in appetite 2 1 1  2   Feeling bad or failure about yourself  0 0 0  0  Trouble concentrating 1 0 1  1  Moving slowly or fidgety/restless 0 0 0  0  Suicidal thoughts 0 0 0  0  PHQ-9 Score 8 4 6  6        04/23/2023    8:51 AM 11/02/2022    9:24 AM 08/02/2022    2:20 PM 05/02/2022    2:26 PM  GAD 7 : Generalized Anxiety Score  Nervous, Anxious, on Edge 2 1 0 0  Control/stop worrying 1 0 0 0  Worry too much - different things 0 0 0 0  Trouble relaxing 0 1 1 2   Restless 0 0 0 0  Easily annoyed or irritable 0 0 0 1  Afraid - awful might happen 0 0 0 0  Total GAD 7 Score 3 2 1  3

## 2023-09-06 ENCOUNTER — Ambulatory Visit: Payer: Medicaid Other | Admitting: Clinical

## 2023-09-06 DIAGNOSIS — F4310 Post-traumatic stress disorder, unspecified: Secondary | ICD-10-CM

## 2023-09-06 DIAGNOSIS — F909 Attention-deficit hyperactivity disorder, unspecified type: Secondary | ICD-10-CM | POA: Diagnosis not present

## 2023-09-06 DIAGNOSIS — Z658 Other specified problems related to psychosocial circumstances: Secondary | ICD-10-CM

## 2023-09-06 NOTE — BH Specialist Note (Signed)
error 

## 2023-09-10 ENCOUNTER — Telehealth: Payer: Self-pay

## 2023-09-10 NOTE — Telephone Encounter (Signed)
Patient called to informed us that she will not be in to get her injections for a little while. She stated that due to personal issues she hasn't had an injection since June. Patient was informed that when she is ready to start back to contact the office and we will reach out to her provider to determine where to restart her at. Patient verbalized understanding and was very apologetic.

## 2023-09-10 NOTE — Telephone Encounter (Signed)
Noted  

## 2023-09-17 ENCOUNTER — Ambulatory Visit: Payer: Medicaid Other | Admitting: Allergy

## 2023-09-21 NOTE — BH Specialist Note (Deleted)
Integrated Behavioral Health via Telemedicine Visit  09/21/2023 [name] [id number]  Number of Integrated Behavioral Health Clinician visits: Additional Visit  Session Start time: 1050   Session End time: 1150  Total time in minutes: 60   Referring Provider: *** Patient/Family location: Home*** [facility]: [facility]  All persons participating in visit: [name] and [facility] [name] ***  Types of Service: {CHL AMB TYPE OF SERVICE:[phone]}  I connected with [name] and/or [name] Bayly's {family members:20773} via  Telephone or Engineer, civil (consulting)  (Video is Surveyor, mining) and verified that I am speaking with the correct person using two identifiers. Discussed confidentiality: Yes   I discussed the limitations of telemedicine and the availability of in person appointments.  Discussed there is a possibility of technology failure and discussed alternative modes of communication if that failure occurs.  I discussed that engaging in this telemedicine visit, they consent to the provision of behavioral healthcare and the services will be billed under their insurance.  Patient and/or legal guardian expressed understanding and consented to Telemedicine visit: Yes   Presenting Concerns: Patient and/or family reports the following symptoms/concerns: *** Duration of problem: ***; Severity of problem: {Mild/Moderate/Severe:20260}  Patient and/or Family's Strengths/Protective Factors: {CHL AMB BH PROTECTIVE [phone]}  Goals Addressed: Patient will:  Reduce symptoms of: {IBH Symptoms:21014056}   Increase knowledge and/or ability of: {IBH Patient Tools:21014057}   Demonstrate ability to: {IBH Goals:21014053}  Progress towards Goals: Ongoing  Interventions: Interventions utilized:  {IBH  Interventions:21014054} Standardized Assessments completed: {IBH Screening Tools:21014051}  Patient and/or Family Response: Patient agrees with treatment plan.   Assessment: Patient currently experiencing ***.   Patient may benefit from continued therapeutic intervention *** .  Plan: Follow up with behavioral health clinician on : *** Behavioral recommendations:  -*** -*** Referral(s): {IBH Referrals:21014055}  I discussed the assessment and treatment plan with the patient and/or parent/guardian. They were provided an opportunity to ask questions and all were answered. They agreed with the plan and demonstrated an understanding of the instructions.   They were advised to call back or seek an in-person evaluation if the symptoms worsen or if the condition fails to improve as anticipated.  [name], LCSW     04/23/2023    8:50 AM 11/02/2022    9:23 AM 08/02/2022    2:20 PM 08/02/2022    2:11 PM 05/02/2022    2:26 PM  Depression screen PHQ 2/9  Decreased Interest 0 0 0 0 0  Down, Depressed, Hopeless 1 1 0 0 0  PHQ - 2 Score 1 1 0 0 0  Altered sleeping 2 1 2  2   Tired, decreased energy 2 1 2  1   Change in appetite 2 1 1  2   Feeling bad or failure about yourself  0 0 0  0  Trouble concentrating 1 0 1  1  Moving slowly or fidgety/restless 0 0 0  0  Suicidal thoughts 0 0 0  0  PHQ-9 Score 8 4 6  6       04/23/2023    8:51 AM 11/02/2022    9:24 AM 08/02/2022    2:20 PM 05/02/2022    2:26 PM  GAD 7 : Generalized Anxiety Score  Nervous, Anxious, on Edge 2 1 0 0  Control/stop worrying 1 0 0 0  Worry too much - different things 0 0 0 0  Trouble relaxing 0 1 1 2  Restless 0 0 0 0  Easily annoyed or irritable 0 0 0 1  Afraid - awful might happen 0 0 0 0  Total GAD 7 Score 3 2 1  3

## 2023-09-24 ENCOUNTER — Telehealth: Payer: Self-pay | Admitting: Family Medicine

## 2023-09-24 ENCOUNTER — Telehealth: Payer: Self-pay | Admitting: Gastroenterology

## 2023-09-24 DIAGNOSIS — L309 Dermatitis, unspecified: Secondary | ICD-10-CM

## 2023-09-24 MED ORDER — LINACLOTIDE 290 MCG PO CAPS: 290.0000 ug | ORAL_CAPSULE | Freq: Every day | ORAL | 3 refills | Status: DC

## 2023-09-24 NOTE — Telephone Encounter (Signed)
Inbound call from patient, would like 90 day refill for Linzess sent to [facility]. States the medication needs a prior authorization. She states [place] over two [place] next week and needs the refill prior to then.

## 2023-09-24 NOTE — Telephone Encounter (Signed)
Sent Linzess 290 mcg to patients pharmacy, The RX team will do pre-cert once the pharmacy receives the rejection from her insurance for the prior authorization

## 2023-09-24 NOTE — Telephone Encounter (Signed)
Can you please check with her what her current medications are and update her med list?  She has a bunch of medications on her list that I am not sure she is taking all of them.  It is okay to write the letter for her.  Thank you.

## 2023-09-24 NOTE — Telephone Encounter (Signed)
Pt is calling to request a letter of her medications to be provided to to customs as she will be traveling to [place] for 2 months. Pt will be traveling 10/04/23-11/24/22 Customs requires documentation for medication longer than a month. Please advise CB- [phone]

## 2023-09-24 NOTE — Telephone Encounter (Signed)
Look for a prior authorization for her  Linzess 290  Thanks

## 2023-09-26 ENCOUNTER — Other Ambulatory Visit (HOSPITAL_COMMUNITY): Payer: Self-pay

## 2023-09-26 MED ORDER — PROMETHAZINE HCL 12.5 MG PO TABS: 12.5000 mg | ORAL_TABLET | Freq: Three times a day (TID) | ORAL | 1 refills | Status: AC | PRN

## 2023-09-26 MED ORDER — TRIAMCINOLONE ACETONIDE 0.1 % EX CREA: 1.0000 | TOPICAL_CREAM | Freq: Two times a day (BID) | CUTANEOUS | 1 refills | Status: DC | PRN

## 2023-09-26 NOTE — Addendum Note (Signed)
Addended by: Lois Huxley, Jeannett Senior L on: 09/26/2023 11:55 AM   Modules accepted: Orders

## 2023-09-26 NOTE — Telephone Encounter (Signed)
Per test claim. Med was last filled 09/24/2023, and the next fill date is 10/17/2023. No prior auth needed at this time.

## 2023-09-26 NOTE — Telephone Encounter (Signed)
Pt is calling to f/u.  Pt stated she needs this as soon as possible.  Please advise.

## 2023-09-26 NOTE — Telephone Encounter (Signed)
Letter has been printed and she can pick this up tomorrow.

## 2023-09-26 NOTE — Telephone Encounter (Signed)
Call returned to patient. Verified I was speaking to the patient using two different patient identifiers. I introduced myself and the reason for my call.   She is traveling soon to [place] and is requesting a printed letter of her medications for customs. I was asked by her PCP to reconcile her medications in preparation for this trip. We covered her entire medication list together. Her list is now updated with her scheduled and as-needed medications. She is requesting refills for promethazine and triamcinolone, and I have sent both of these in to [facility].  Forwarding this information to her PCP and PCP's nurse. I have generated the requested letter containing a list of her medications. She wishes to pick this up in-person this afternoon or tomorrow (09/27/23).  Butch Penny, PharmD, Patsy [place], CPP Clinical Pharmacist [facility] [phone]

## 2023-09-26 NOTE — Telephone Encounter (Signed)
Letter has been placed up front for pick up. 

## 2023-10-25 ENCOUNTER — Ambulatory Visit: Payer: Medicaid Other | Admitting: Family Medicine

## 2023-11-15 NOTE — BH Specialist Note (Deleted)
 Integrated Behavioral Health via Telemedicine Visit  11/15/2023 Rachel [name] [id number]  Number of Integrated Behavioral Health Clinician visits: Additional Visit  Session Start time: 1050   Session End time: 1150  Total time in minutes: 60   Referring Provider: Mariel Aloe, MD Rachel/Family location: Home*** [facility] Provider location: [facility] for [facility] at [facility]  All persons participating in visit: Rachel [name] and Rachel [name] Rachel [name] ***  Types of Service: {CHL AMB TYPE OF [phone]}  I connected with Rachel [name] [name]'s {family members:20773} via  Telephone or Engineer, civil (consulting)  (Video is Caregility application) and verified that I am speaking with the correct person using two identifiers. Discussed confidentiality: Yes   I discussed the limitations of telemedicine and the availability of in person appointments.  Discussed there is a possibility of technology failure and discussed alternative modes of communication if that failure occurs.  I discussed that engaging in this telemedicine visit, they consent to the provision of behavioral healthcare and the services will be billed under their insurance.  Rachel and/or legal guardian expressed understanding and consented to Telemedicine visit: Yes   Presenting Concerns: Rachel and/or family reports the following symptoms/concerns: *** Duration of problem: ***; Severity of problem: {Mild/Moderate/Severe:20260}  Rachel and/or Family's Strengths/Protective Factors: {CHL AMB BH PROTECTIVE FACTORS:[phone]}  Goals Addressed: Rachel will:  Reduce symptoms of: {IBH Symptoms:21014056}   Increase knowledge and/or ability of: {IBH Rachel Tools:21014057}   Demonstrate ability to: {IBH Goals:21014053}  Progress towards Goals: {CHL AMB BH PROGRESS TOWARDS  [phone]}  Interventions: Interventions utilized:  {IBH Interventions:21014054} Standardized Assessments completed: {IBH Screening Tools:21014051}  Rachel and/or Family Response: Rachel agrees with treatment plan. ***  Assessment: Rachel currently experiencing ***.   Rachel may benefit from continued therapeutic intervention *** .  Plan: Follow up with behavioral health clinician on : *** Behavioral recommendations:  -*** -*** Referral(s): {IBH Referrals:21014055}  I discussed the assessment and treatment plan with the Rachel and/or parent/guardian. They were provided an opportunity to ask questions and all were answered. They agreed with the plan and demonstrated an understanding of the instructions.   They were advised to call back or seek an in-person evaluation if the symptoms worsen or if the condition fails to improve as anticipated.  [name] Denishia Citro, LCSW     04/23/2023    8:50 AM 11/02/2022    9:23 AM 08/02/2022    2:20 PM 08/02/2022    2:11 PM 05/02/2022    2:26 PM  Depression screen PHQ 2/9  Decreased Interest 0 0 0 0 0  Down, Depressed, Hopeless 1 1 0 0 0  PHQ - 2 Score 1 1 0 0 0  Altered sleeping 2 1 2  2   Tired, decreased energy 2 1 2  1   Change in appetite 2 1 1  2   Feeling bad or failure about yourself  0 0 0  0  Trouble concentrating 1 0 1  1  Moving slowly or fidgety/restless 0 0 0  0  Suicidal thoughts 0 0 0  0  PHQ-9 Score 8 4 6  6       04/23/2023    8:51 AM 11/02/2022    9:24 AM 08/02/2022    2:20 PM 05/02/2022    2:26 PM  GAD 7 : Generalized Anxiety Score  Nervous, Anxious, on Edge 2 1 0 0  Control/stop worrying 1 0 0 0  Worry too much - different things 0 0 0 0  Trouble relaxing 0 1 1 2   Restless 0 0 0 0  Easily annoyed or irritable 0 0 0 1  Afraid - awful might happen 0 0 0 0  Total GAD 7 Score 3 2 1  3

## 2023-11-27 NOTE — BH Specialist Note (Signed)
Integrated Behavioral Health via Telemedicine Visit  12/11/2023 [name] [id number]  Number of Integrated Behavioral Health Clinician visits: Additional Visit  Session Start time: 1046   Session End time: 1156  Total time in minutes: 70   Referring Provider: Mariel Aloe, MD Patient/Family location: Home [facility] Provider location: [facility]  All persons participating in visit: [name] and [facility] Chevie Birkhead   Types of Service: Individual psychotherapy and Telephone visit  I connected with [name] and/or [name] Purrington's  n/a  via  Telephone or Video Enabled Telemedicine Application  (Video is Caregility application) and verified that I am speaking with the correct person using two identifiers. Discussed confidentiality: Yes   I discussed the limitations of telemedicine and the availability of in person appointments.  Discussed there is a possibility of technology failure and discussed alternative modes of communication if that failure occurs.  I discussed that engaging in this telemedicine visit, they consent to the provision of behavioral healthcare and the services will be billed under their insurance.  Patient and/or legal guardian expressed understanding and consented to Telemedicine visit: Yes   Presenting Concerns: Patient and/or family reports the following symptoms/concerns: Processing mixed emotions regarding recent trip to [place] with a female friend (whose actions did not reflect his words/original agreement regarding trip), followed by stress upon return home impacting health.  Duration of problem: Ongoing; Severity of problem: moderate  Patient and/or Family's Strengths/Protective Factors: Social connections, Concrete supports in place (healthy food, safe environments, etc.), and Sense of purpose  Goals Addressed: Patient will:  Reduce symptoms of:  anxiety and stress    Demonstrate ability to: Increase healthy adjustment to current life circumstances  Progress towards Goals: Ongoing  Interventions: Interventions utilized:  Supportive Reflection Standardized Assessments completed: Not Needed  Patient and/or Family Response: Patient agrees with treatment plan.   Assessment: Patient currently experiencing ADHD; PTSD; Psychosocial stress.   Patient may benefit from continued therapeutic intervention. .  Plan: Follow up with behavioral health clinician on : Two weeks Behavioral recommendations:  -Continue daily self-coping strategies and maintaining healthy boundaries in relationships -Continue prioritizing healthy self-care daily -Consider setting aside at least 10 minutes daily brainstorming ideas to meet future goals, as discussed (write it all down, with no judgment) Referral(s): Integrated [employer] (In Clinic)  I discussed the assessment and treatment plan with the patient and/or parent/guardian. They were provided an opportunity to ask questions and all were answered. They agreed with the plan and demonstrated an understanding of the instructions.   They were advised to call back or seek an in-person evaluation if the symptoms worsen or if the condition fails to improve as anticipated.  [name], LCSW     04/23/2023    8:50 AM 11/02/2022    9:23 AM 08/02/2022    2:20 PM 08/02/2022    2:11 PM 05/02/2022    2:26 PM  Depression screen PHQ 2/9  Decreased Interest 0 0 0 0 0  Down, Depressed, Hopeless 1 1 0 0 0  PHQ - 2 Score 1 1 0 0 0  Altered sleeping 2 1 2  2   Tired, decreased energy 2 1 2  1   Change in appetite 2 1 1  2   Feeling bad or failure about yourself  0 0 0  0  Trouble concentrating 1 0 1  1  Moving slowly or fidgety/restless 0 0 0  0  Suicidal thoughts 0  0 0  0  PHQ-9 Score 8 4 6  6       04/23/2023    8:51 AM 11/02/2022    9:24 AM 08/02/2022    2:20 PM 05/02/2022    2:26 PM   GAD 7 : Generalized Anxiety Score  Nervous, Anxious, on Edge 2 1 0 0  Control/stop worrying 1 0 0 0  Worry too much - different things 0 0 0 0  Trouble relaxing 0 1 1 2   Restless 0 0 0 0  Easily annoyed or irritable 0 0 0 1  Afraid - awful might happen 0 0 0 0  Total GAD 7 Score 3 2 1  3

## 2023-12-11 ENCOUNTER — Ambulatory Visit (INDEPENDENT_AMBULATORY_CARE_PROVIDER_SITE_OTHER): Payer: Medicaid Other | Admitting: Clinical

## 2023-12-11 DIAGNOSIS — F909 Attention-deficit hyperactivity disorder, unspecified type: Secondary | ICD-10-CM | POA: Diagnosis not present

## 2023-12-11 DIAGNOSIS — Z658 Other specified problems related to psychosocial circumstances: Secondary | ICD-10-CM

## 2023-12-11 DIAGNOSIS — F4310 Post-traumatic stress disorder, unspecified: Secondary | ICD-10-CM

## 2023-12-18 NOTE — BH Specialist Note (Unsigned)
Integrated Behavioral Health via Telemedicine Visit  12/18/2023 Rachel [name] [id number]  Number of Integrated Behavioral Health Clinician visits: Additional Visit  Session Start time: 1046   Session End time: 1156  Total time in minutes: 70   Referring Provider: Mariel Aloe, MD Patient/Family location: Home*** [facility] Provider location: [facility] at [facility]  All persons participating in visit: [name] and [facility] Jerrie Gullo ***  Types of Service: {CHL AMB TYPE OF SERVICE:[phone]}  I connected with [name] and/or [name] Adderley's {family members:20773} via  Telephone or Engineer, civil (consulting)  (Video is Caregility application) and verified that I am speaking with the correct person using two identifiers. Discussed confidentiality: Yes   I discussed the limitations of telemedicine and the availability of in person appointments.  Discussed there is a possibility of technology failure and discussed alternative modes of communication if that failure occurs.  I discussed that engaging in this telemedicine visit, they consent to the provision of behavioral healthcare and the services will be billed under their insurance.  Patient and/or legal guardian expressed understanding and consented to Telemedicine visit: Yes   Presenting Concerns: Patient and/or family reports the following symptoms/concerns: *** Duration of problem: ***; Severity of problem: {Mild/Moderate/Severe:20260}  Patient and/or Family's Strengths/Protective Factors: {CHL AMB BH PROTECTIVE FACTORS:[phone]}  Goals Addressed: Patient will:  Reduce symptoms of: {IBH Symptoms:21014056}   Increase knowledge and/or ability of: {IBH Patient Tools:21014057}   Demonstrate ability to: {IBH Goals:21014053}  Progress towards Goals: {CHL AMB BH PROGRESS TOWARDS  [phone]}  Interventions: Interventions utilized:  {IBH Interventions:21014054} Standardized Assessments completed: {IBH Screening Tools:21014051}  Patient and/or Family Response: Patient agrees with treatment plan.   Assessment: Patient currently experiencing ***.   Patient may benefit from continued therapeutic intervention *** .  Plan: Follow up with behavioral health clinician on : *** Behavioral recommendations:  -*** -*** Referral(s): {IBH Referrals:21014055}  I discussed the assessment and treatment plan with the patient and/or parent/guardian. They were provided an opportunity to ask questions and all were answered. They agreed with the plan and demonstrated an understanding of the instructions.   They were advised to call back or seek an in-person evaluation if the symptoms worsen or if the condition fails to improve as anticipated.  [name] Jalyn Dutta, LCSW     04/23/2023    8:50 AM 11/02/2022    9:23 AM 08/02/2022    2:20 PM 08/02/2022    2:11 PM 05/02/2022    2:26 PM  Depression screen PHQ 2/9  Decreased Interest 0 0 0 0 0  Down, Depressed, Hopeless 1 1 0 0 0  PHQ - 2 Score 1 1 0 0 0  Altered sleeping 2 1 2  2   Tired, decreased energy 2 1 2  1   Change in appetite 2 1 1  2   Feeling bad or failure about yourself  0 0 0  0  Trouble concentrating 1 0 1  1  Moving slowly or fidgety/restless 0 0 0  0  Suicidal thoughts 0 0 0  0  PHQ-9 Score 8 4 6  6       04/23/2023    8:51 AM 11/02/2022    9:24 AM 08/02/2022    2:20 PM 05/02/2022    2:26 PM  GAD 7 : Generalized Anxiety Score  Nervous, Anxious, on Edge 2 1 0 0  Control/stop worrying 1 0 0 0  Worry too much - different things 0 0 0 0  Trouble relaxing 0 1 1 2   Restless 0 0 0 0  Easily annoyed or irritable 0 0 0 1  Afraid - awful might happen 0 0 0 0  Total GAD 7 Score 3 2 1  3

## 2023-12-20 ENCOUNTER — Other Ambulatory Visit: Payer: Self-pay | Admitting: Family Medicine

## 2023-12-20 DIAGNOSIS — D509 Iron deficiency anemia, unspecified: Secondary | ICD-10-CM

## 2023-12-22 ENCOUNTER — Ambulatory Visit
Admission: EM | Admit: 2023-12-22 | Discharge: 2023-12-22 | Disposition: A | Discharge status: Home / Self Care | Payer: Medicaid Other | Attending: Family Medicine | Admitting: Family Medicine

## 2023-12-22 ENCOUNTER — Other Ambulatory Visit: Payer: Self-pay

## 2023-12-22 DIAGNOSIS — R0602 Shortness of breath: Secondary | ICD-10-CM | POA: Insufficient documentation

## 2023-12-22 DIAGNOSIS — F319 Bipolar disorder, unspecified: Secondary | ICD-10-CM | POA: Diagnosis not present

## 2023-12-22 DIAGNOSIS — K219 Gastro-esophageal reflux disease without esophagitis: Secondary | ICD-10-CM | POA: Diagnosis not present

## 2023-12-22 DIAGNOSIS — R509 Fever, unspecified: Secondary | ICD-10-CM | POA: Diagnosis not present

## 2023-12-22 DIAGNOSIS — R0989 Other specified symptoms and signs involving the circulatory and respiratory systems: Secondary | ICD-10-CM | POA: Insufficient documentation

## 2023-12-22 DIAGNOSIS — K3184 Gastroparesis: Secondary | ICD-10-CM | POA: Insufficient documentation

## 2023-12-22 DIAGNOSIS — R6889 Other general symptoms and signs: Secondary | ICD-10-CM | POA: Diagnosis not present

## 2023-12-22 LAB — RESP PANEL BY RT-PCR (RSV, FLU A&B, COVID)  RVPGX2
Influenza A by PCR: POSITIVE — AB
Influenza B by PCR: NEGATIVE
Resp Syncytial Virus by PCR: NEGATIVE
SARS Coronavirus 2 by RT PCR: NEGATIVE

## 2023-12-22 MED ORDER — OSELTAMIVIR PHOSPHATE 75 MG PO CAPS: 75.0000 mg | ORAL_CAPSULE | Freq: Two times a day (BID) | ORAL | 0 refills | Status: DC

## 2023-12-22 NOTE — Discharge Instructions (Addendum)
 Advised patient to take medication as directed with food to completion.  Advised patient may take OTC Tylenol  1 g every 6 hours for fever (oral temperature greater than 100.3.  Encouraged increase daily water  intake to 64 ounces per day while taking this medication.  Advised we will follow-up with respiratory panel results once received.

## 2023-12-22 NOTE — ED Triage Notes (Signed)
 [facility] of sob, body aches, weakness, dizziness, runny nose, sneezing, ha, light sensitivity, burning eyes, generalized body aches and hot and cold flashes since yesterday. Pt reports her father is ill with the same symptoms.   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

## 2023-12-22 NOTE — ED Provider Notes (Signed)
 [facility]    CSN: [id number] Arrival date & time: 12/22/23  1246      History   Chief Complaint Chief Complaint  Patient presents with   Generalized Body Aches   Headache    HPI Rachel [name] is a 38 y.o. female.   HPI 38 year old female presents with flulike symptoms (shortness of breath, body aches, weakness, dizziness, runny nose, headache) since yesterday.  PMH significant for bipolar disorder, GERD, and gastroparesis.  Past Medical History:  Diagnosis Date   ADHD (attention deficit hyperactivity disorder)    Anemia    Anxiety    Asthma    Bipolar disorder (HCC)    Complication of anesthesia    slow to wake up    Delayed gastric emptying 02/23/2020   Depression    DUB (dysfunctional uterine bleeding) 02/23/2020   Eczema    Ehlers-Danlos syndrome    Endometriosis    Gastroparesis    GERD (gastroesophageal reflux disease)    Heart murmur    Hiatal hernia 04/06/2020   Myalgia 04/06/2020   Nausea & vomiting 09/22/2022   Parasitic infection 02/23/2020   Pneumonia    Poor appetite 02/23/2020   Recurrent upper respiratory infection (URI)    RUQ pain 02/23/2020   Vitamin D  deficiency    Weight loss 02/23/2020    Patient Active Problem List   Diagnosis Date Noted   Acute stress reaction 06/14/2023   Chronic migraine w/o aura w/o status migrainosus, not intractable 02/23/2023   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   Memory loss 12/25/2022   Paresthesia 12/25/2022   Other allergic rhinitis 12/04/2022   Allergic conjunctivitis of both eyes 12/04/2022   Mild intermittent asthma without complication 12/04/2022   Other atopic dermatitis 12/04/2022   Allergic reaction 12/04/2022   Rash and other nonspecific skin eruption 12/04/2022   Multiple drug allergies 12/04/2022   Other adverse food reactions, not elsewhere classified, subsequent encounter 12/04/2022   Abnormal CT of the abdomen 09/23/2022   Abdominal pain 09/22/2022   Asthma, chronic  09/22/2022   HLD (hyperlipidemia) 09/22/2022   Nausea & vomiting 09/22/2022   Iron  deficiency anemia 01/20/2022   Acute hyponatremia 01/18/2022   Pyelonephritis 01/18/2022   Protein-calorie malnutrition, severe 01/18/2022   sepsis secondary to pyelonephritis of left kidney 01/17/2022   Sepsis (HCC) 01/17/2022   Chronic abdominal pain 01/17/2022   Dyspnea 01/17/2022   Underweight 01/17/2022   Irritable bowel syndrome (IBS) 01/17/2022   Hypokalemia 01/17/2022   Vasomotor symptoms due to menopause 08/17/2021   S/P laparoscopic hysterectomy 07/12/2021   Preoperative exam for gynecologic surgery 06/20/2021   Draining postoperative wound 02/28/2021   Encounter for postoperative care 02/16/2021   Endometriosis determined by laparoscopy    Pelvic pain in female 10/11/2020   Presence of subdermal contraceptive implant 10/11/2020   Dysmenorrhea 10/11/2020   Dyspareunia in female 10/11/2020   NSVT (nonsustained ventricular tachycardia) (HCC) 09/03/2020   Gastroparesis 08/18/2020   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   Palpitations 07/26/2020   Family history of connective tissue disease 07/26/2020   Myalgia 04/06/2020   Hiatal hernia 04/06/2020   Recurrent infections 02/23/2020   Weight loss 02/23/2020   Poor appetite 02/23/2020   RUQ pain 02/23/2020   Delayed gastric emptying 02/23/2020   DUB (dysfunctional uterine bleeding) 02/23/2020   Cervical radiculopathy 05/31/2018   Hyperalgesia 05/31/2018   Muscle weakness 03/13/2018   GERD (gastroesophageal reflux disease) 12/26/2017   ADHD (attention deficit hyperactivity disorder) 05/07/2012   Bipolar 1 disorder (HCC) 05/07/2012    Past  Surgical History:  Procedure Laterality Date   barium study     COLONOSCOPY     CYSTOSCOPY N/A 07/12/2021   Procedure: CYSTOSCOPY;  Surgeon: Zina Jerilynn LABOR, MD;  Location: [facility] OR;  Service: Gynecology;  Laterality: N/A;   ESOPHAGOGASTRODUODENOSCOPY  2011   and a ph study as well.    LAPAROSCOPIC  GASTRIC RESECTION N/A 01/22/2023   Procedure: LAPAROSCOPIC GASTRIC PEXY;  Surgeon: Lyndel Deward PARAS, MD;  Location: [facility];  Service: General;  Laterality: N/A;   LAPAROSCOPY N/A 01/26/2021   Procedure: LAPAROSCOPY DIAGNOSTIC;  Surgeon: Zina Jerilynn LABOR, MD;  Location: [facility];  Service: Gynecology;  Laterality: N/A;   NASAL SEPTUM SURGERY     SINOSCOPY     TONGUE FLAP     TOTAL LAPAROSCOPIC HYSTERECTOMY WITH SALPINGECTOMY Bilateral 07/12/2021   Procedure: TOTAL LAPAROSCOPIC HYSTERECTOMY WITH SALPINGECTOMY, OOPHORECTOMY;  Surgeon: Zina Jerilynn LABOR, MD;  Location: [facility] OR;  Service: Gynecology;  Laterality: Bilateral;   UPPER GASTROINTESTINAL ENDOSCOPY     WISDOM TOOTH EXTRACTION      OB History     Gravida  0   Para  0   Term  0   Preterm  0   AB  0   Living  0      SAB  0   IAB  0   Ectopic  0   Multiple  0   Live Births  0            Home Medications    Prior to Admission medications   Medication Sig Start Date End Date Taking? Authorizing Provider  oseltamivir  (TAMIFLU ) 75 MG capsule Take 1 capsule (75 mg total) by mouth every 12 (twelve) hours. 12/22/23  Yes Teddy Sharper, FNP  acetaminophen  (TYLENOL ) 500 MG tablet Take 500-1,000 mg by mouth every 6 (six) hours as needed for moderate pain or mild pain.    [provider]  albuterol  (VENTOLIN  HFA) 108 (90 Base) MCG/ACT inhaler Inhale 2 puffs into the lungs every 4 (four) hours as needed for wheezing or shortness of breath (coughing fits). 03/19/23   Luke Orlan HERO, DO  atorvastatin  (LIPITOR) 20 MG tablet Take 1 tablet (20 mg total) by mouth daily. 04/23/23   Newlin, Enobong, MD  Calcium -Magnesium-Vitamin D  600-40-500 MG-MG-UNIT TB24 Take 1 tablet by mouth every evening.    [provider]  diphenhydrAMINE  (BENADRYL ) 25 MG tablet Take 25 mg by mouth every 6 (six) hours as needed for allergies or itching.    [provider]  EPINEPHrine  0.3 mg/0.3 mL IJ SOAJ injection  Inject 0.3 mg into the muscle as needed for anaphylaxis. 12/04/22   Luke Orlan HERO, DO  ferrous sulfate  (FEROSUL) 325 (65 FE) MG tablet TAKE 1 TABLET BY MOUTH DAILY 12/21/23   Newlin, Enobong, MD  fexofenadine  (ALLEGRA ) 180 MG tablet Take 180 mg by mouth every evening.    [provider]  hydrocortisone cream 1 % Apply 1 Application topically 2 (two) times daily.    [provider]  ibuprofen  (ADVIL ) 200 MG tablet Take 400-800 mg by mouth every 8 (eight) hours as needed (pain.).    [provider]  linaclotide  (LINZESS ) 290 MCG CAPS capsule Take 1 capsule (290 mcg total) by mouth daily before breakfast. 09/24/23   Nandigam, Kavitha V, MD  Melatonin 10 MG TABS Take 10 mg by mouth at bedtime.    [provider]  MIRALAX  17 GM/SCOOP powder Take 17-34 g by mouth daily. Mix 17 g of  powder into water  and drink once a day    [provider]  Olopatadine HCl (PATADAY) 0.2 % SOLN Place 1-2 drops into both eyes 3 (three) times daily as needed (allergies eye).    [provider]  PREMARIN  0.625 MG tablet TAKE 1 TABLET BY MOUTH DAILY TAKE FOR 21 DAYS THEN DO NOT TAKE FOR 7 DAYS 04/04/23   Zina Jerilynn LABOR, MD  Prenatal Vit-Fe Fumarate-FA (MULTIVITAMIN-PRENATAL) 27-0.8 MG TABS tablet Take 1 tablet by mouth daily at 12 noon.    [provider]  promethazine  (PHENERGAN ) 12.5 MG tablet Take 1 tablet (12.5 mg total) by mouth every 8 (eight) hours as needed for nausea. 09/26/23   Newlin, Enobong, MD  rizatriptan  (MAXALT -MLT) 10 MG disintegrating tablet May repeat in 2 hours if needed 02/23/23   Onita Duos, MD  sennosides-docusate sodium  (SENOKOT-S) 8.6-50 MG tablet Take 2-3 tablets by mouth daily as needed for constipation.    [provider]  triamcinolone  cream (KENALOG ) 0.1 % Apply 1 Application topically 2 (two) times daily as needed (rash, itching). 09/26/23   Delbert Clam, MD    Family History Family History  Problem Relation Age of Onset    Eczema Mother    Urticaria Mother    Allergic rhinitis Mother    Colon polyps Mother    Depression Mother    Anxiety disorder Mother    Hypertension Mother    Hyperlipidemia Mother    Asthma Mother    Endometriosis Mother    Fibroids Mother    Fibromyalgia Mother    Migraines Mother    Irritable bowel syndrome Mother    Allergic rhinitis Father    Depression Father    Hypertension Father    Hyperlipidemia Father    Bipolar disorder Father    Urticaria Sister    Allergic rhinitis Sister    Bipolar disorder Sister    ADD / ADHD Sister    Asthma Sister    Endometriosis Sister    Seizures Sister    Bipolar disorder Sister    Anxiety disorder Sister    Heart disease Maternal Grandmother    Endometriosis Maternal Grandmother    Fibroids Maternal Grandmother    Hyperlipidemia Maternal Grandmother    Hypertension Maternal Grandmother    Diabetes Maternal Grandmother    Fibromyalgia Maternal Grandmother    Heart disease Maternal Grandfather    Diabetes Maternal Grandfather    Heart disease Paternal Grandmother    Heart disease Paternal Grandfather    Colon cancer Maternal Great-grandmother    Pancreatic cancer Neg Hx    Esophageal cancer Neg Hx    Stomach cancer Neg Hx     Social History Social History   Tobacco Use   Smoking status: Never    Passive exposure: Never   Smokeless tobacco: Never  Vaping Use   Vaping status: Never Used  Substance Use Topics   Alcohol use: Yes    Alcohol/week: 1.0 standard drink of alcohol    Types: 1 Glasses of wine per week    Comment: 1 glass of wine every other week   Drug use: No     Allergies   Amitriptyline , Amphetamine-dextroamphetamine, Lupron [leuprolide], Orilissa  [elagolix], Penicillins, Wound dressing adhesive, Betadine  [povidone-iodine ], Chlorhexidine , Doxycycline, Mixed ragweed, Olanzapine, Other, Prozac [fluoxetine hcl], Stevia glycerite extract [flavoring agent (non-screening)], Tape, Triple antibiotic pain relief  [neomy-bacit-polymyx-pramoxine], and Reglan  [metoclopramide ]   Review of Systems Review of Systems  HENT:  Positive for rhinorrhea.   Eyes:  Positive for pain.  Musculoskeletal:  Positive for arthralgias and myalgias.  Neurological:  Positive for dizziness, light-headedness and headaches.  All other systems reviewed and are negative.    Physical Exam Triage Vital Signs ED Triage Vitals  Encounter Vitals Group     BP      Systolic BP Percentile      Diastolic BP Percentile      Pulse      Resp      Temp      Temp src      SpO2      Weight      Height      Head Circumference      Peak Flow      Pain Score      Pain Loc      Pain Education      Exclude from Growth Chart    No data found.  Updated Vital Signs BP 119/89   Pulse 98   Temp 99.2 F (37.3 C)   Resp 16   LMP  (LMP Unknown)   SpO2 98%     Physical Exam Vitals and nursing note reviewed.  Constitutional:      General: She is not in acute distress.    Appearance: Normal appearance. She is normal weight. She is ill-appearing. She is not toxic-appearing or diaphoretic.  HENT:     Head: Normocephalic and atraumatic.     Right Ear: Tympanic membrane, ear canal and external ear normal.     Left Ear: Tympanic membrane, ear canal and external ear normal.     Mouth/Throat:     Mouth: Mucous membranes are moist.     Pharynx: Oropharynx is clear.  Eyes:     Extraocular Movements: Extraocular movements intact.     Conjunctiva/sclera: Conjunctivae normal.     Pupils: Pupils are equal, round, and reactive to light.  Cardiovascular:     Rate and Rhythm: Normal rate and regular rhythm.     Pulses: Normal pulses.     Heart sounds: Normal heart sounds.  Pulmonary:     Effort: Pulmonary effort is normal.     Breath sounds: Normal breath sounds. No wheezing, rhonchi or rales.  Musculoskeletal:        General: Normal range of motion.     Cervical back: Normal range of motion and neck supple.  Skin:    General:  Skin is warm and dry.  Neurological:     General: No focal deficit present.     Mental Status: She is alert and oriented to person, place, and time. Mental status is at baseline.  Psychiatric:        Mood and Affect: Mood normal.        Behavior: Behavior normal.        Thought Content: Thought content normal.      UC Treatments / Results  Labs (all labs ordered are listed, but only abnormal results are displayed) Labs Reviewed  RESP PANEL BY RT-PCR (RSV, FLU A&B, COVID)  [id number]    EKG   Radiology No results found.  Procedures Procedures (including critical care time)  Medications Ordered in UC Medications - No data to display  Initial Impression / Assessment and Plan / UC Course  I have reviewed the triage vital signs and the nursing notes.  Pertinent labs & imaging results that were available during my care of the patient were reviewed by me and considered in my medical decision making (see chart for details).  MDM: 1.  Influenza-like symptoms-respiratory panel by PCR ordered Rx'd Tamiflu  75 mg capsule: Take 1 capsule twice daily x 5 days; 2.  Fever, unspecified-Advised patient may take OTC Tylenol  1 g every 6 hours for fever (oral temperature greater than 100.3. Advised patient to take medication as directed with food to completion.  Advised patient may take OTC Tylenol  1 g every 6 hours for fever (oral temperature greater than 100.3.  Encouraged increase daily water  intake to 64 ounces per day while taking this medication.  Advised we will follow-up with respiratory panel results once received.  Patient discharged home, hemodynamically stable. Final Clinical Impressions(s) / UC Diagnoses   Final diagnoses:  Influenza-like symptoms  Fever, unspecified     Discharge Instructions      Advised patient to take medication as directed with food to completion.  Advised patient may take OTC Tylenol  1 g every 6 hours for fever (oral temperature greater than 100.3.   Encouraged increase daily water  intake to 64 ounces per day while taking this medication.  Advised we will follow-up with respiratory panel results once received.     ED Prescriptions     Medication Sig Dispense Auth. Provider   oseltamivir  (TAMIFLU ) 75 MG capsule Take 1 capsule (75 mg total) by mouth every 12 (twelve) hours. 10 capsule Dhanush Jokerst, FNP      PDMP not reviewed this encounter.   Teddy Sharper, FNP 12/22/23 [phone]

## 2023-12-24 ENCOUNTER — Ambulatory Visit: Payer: Self-pay | Admitting: Family Medicine

## 2023-12-24 NOTE — Telephone Encounter (Signed)
  Chief Complaint: flu follow up Symptoms: sore throat, fever, body aches, headache, dizzy, fatigue  Frequency: constant  Disposition: [] ED /[] Urgent Care (no appt availability in office) / [] Appointment(In office/virtual)/ []  [facility]/ [x] Home Care/ [] Refused Recommended Disposition /[] [place] [facility]/ []  Follow-up with PCP Additional Notes: Pt calling to inform pcp of her symptoms. Pt was seen at urgent care on 2/8 with Type A flu. Pt has taken Tamiflu  since Saturday. Pt states her symptoms are improving, but still present. Fever is reduced  to 99.2. Pt is taking advil  for body aches and fever. Pt uses a rescue inhaler as needed. Pt wants to see PCP before 3/26 for other concerns that the pt doesn't want to discuss at this time.  RN advised pt to finish medication and gave home care remedies to help ease discomfort. Pt verbalized understanding.                Reason for Disposition  [1] Influenza diagnosed by doctor (or NP/PA) AND [2] no complications  Answer Assessment - Initial Assessment Questions 1. DIAGNOSIS CONFIRMATION: "When was the influenza diagnosed?" "By whom?" "Did you get a test for it?"     2/8 2. MEDICINES: "Were you prescribed any medications for the influenza?"  (e.g., zanamivir [Relenza], oseltamavir [Tamiflu ]).      Tamiflu  3. ONSET of SYMPTOMS: "When did your symptoms start?" 2/7      4. SYMPTOMS: "What symptoms are you most concerned about?" (e.g., runny nose, stuffy nose, sore throat, cough, breathing difficulty, fever)     Body aches, headache, fever, cough dizzy, sore throat   5. COUGH: "How bad is the cough?" Uses recuse inhaler as needed       6. FEVER: "Do you have a fever?" If Yes, ask: "What is your temperature, how was it measured, and when did it start?"     99.2 7. RESPIRATORY DISTRESS: "Are you having any trouble breathing?" If Yes, ask: "Describe your breathing."      Yes, uses inhaler. Also has asthma  8. FLU VACCINE:  "Did you receive a flu shot this year?" (e.g., seasonal influenza, H1N1)     Yes- late October  9. PREGNANCY: "Is there any chance you are pregnant?" "When was your last menstrual period?"     no 10. HIGH RISK for COMPLICATIONS: "Do you have any heart or lung problems? Do you have a weakened immune system?" (e.g., CHF, COPD, asthma, HIV positive, chemotherapy, renal failure, diabetes mellitus, sickle cell anemia)       Autosomal disorders  Protocols used: Influenza (Flu) Follow-up Call-A-AH

## 2023-12-24 NOTE — Telephone Encounter (Signed)
 It looks like she is seeking an earlier appointment as well to discuss other issues. Thanks

## 2023-12-24 NOTE — Telephone Encounter (Signed)
 FYI

## 2023-12-25 ENCOUNTER — Ambulatory Visit: Payer: Medicaid Other | Admitting: Family Medicine

## 2023-12-26 NOTE — Telephone Encounter (Signed)
LVM for patient to return call.

## 2023-12-27 NOTE — Telephone Encounter (Signed)
Patient has been called and given earlier appointment with PCP

## 2023-12-28 ENCOUNTER — Ambulatory Visit: Payer: Medicaid Other | Admitting: Clinical

## 2023-12-28 DIAGNOSIS — F909 Attention-deficit hyperactivity disorder, unspecified type: Secondary | ICD-10-CM

## 2023-12-28 DIAGNOSIS — F4310 Post-traumatic stress disorder, unspecified: Secondary | ICD-10-CM

## 2023-12-28 DIAGNOSIS — Z658 Other specified problems related to psychosocial circumstances: Secondary | ICD-10-CM

## 2023-12-28 NOTE — Patient Instructions (Signed)
Center for [facility] at [facility] [address] [place], [place] [address] [phone] (main office) [phone] (Keawe Marcello's office)  SCORE: For the life of your business [email or web address]   [email or web address] [email or web address]   [employer] [email or web address]

## 2023-12-30 ENCOUNTER — Other Ambulatory Visit: Payer: Self-pay | Admitting: Family Medicine

## 2023-12-30 DIAGNOSIS — I700 Atherosclerosis of aorta: Secondary | ICD-10-CM

## 2023-12-31 ENCOUNTER — Other Ambulatory Visit: Payer: Self-pay | Admitting: Family Medicine

## 2023-12-31 DIAGNOSIS — I700 Atherosclerosis of aorta: Secondary | ICD-10-CM
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
## 2023-12-31 NOTE — Telephone Encounter (Unsigned)
 Copied from CRM [id number]. Topic: Clinical - Medication Refill >> Dec 31, 2023  2:07 PM Clayton Bibles wrote: Most Recent Primary Care Visit:  Provider: Hoy Register  Department: [facility]  Visit Type: OFFICE VISIT  Date: 04/23/2023  Medication: atorvastatin (LIPITOR) 20 MG tablet  Has the patient contacted their pharmacy? Yes (Agent: If no, request that the patient contact the pharmacy for the refill. If patient does not wish to contact the pharmacy document the reason why and proceed with request.) (Agent: If yes, when and what did the pharmacy advise?) Pharmacy new order from doctor  Is this the correct pharmacy for this prescription? Yes Karin Golden If no, delete pharmacy and type the correct one.  This is the patient's preferred pharmacy:  [facility] [id number] [place], [place] - [address] [place] [address] Phone: [phone] Fax: [phone]   Has the prescription been filled recently? No  Is the patient out of the medication? No - She will be out in 1 week  Has the patient been seen for an appointment in the last year OR does the patient have an upcoming appointment? Yes  Can we respond through MyChart? Yes  Agent: Please be advised that Rx refills may take up to 3 business days. We ask that you follow-up with your pharmacy.

## 2024-01-11 ENCOUNTER — Ambulatory Visit: Payer: Medicaid Other | Admitting: Clinical

## 2024-01-11 DIAGNOSIS — F4310 Post-traumatic stress disorder, unspecified: Secondary | ICD-10-CM | POA: Diagnosis not present

## 2024-01-11 DIAGNOSIS — F909 Attention-deficit hyperactivity disorder, unspecified type: Secondary | ICD-10-CM

## 2024-01-11 DIAGNOSIS — Z658 Other specified problems related to psychosocial circumstances: Secondary | ICD-10-CM

## 2024-01-14 NOTE — Patient Instructions (Signed)
 Center for [facility] at [facility] [address] [place], [place] [address] [phone] (main office) [phone] [facility] office)  [facility]  [address], [place], [place] [address] [phone] or [phone] [facility] 24/7 FOR ANYONE [address], [place], [place]  [phone] Fax: [phone] [email or web address] *Interpreters available *Accepts all insurance and uninsured for Urgent Care needs *Accepts Medicaid and uninsured for outpatient treatment (below)    ONLY FOR [facility]  Below:   Outpatient New Patient Assessment/Therapy Walk-ins:        Monday -Thursday 8am until slots are full.        Every Friday 1pm-4pm  (first come, first served)                   New Patient Psychiatry/Medication Management        Monday-Friday 8am-11am (first come, first served)              For all walk-ins we ask that you arrive by 7:15am, because patients will be seen in the order of arrival.

## 2024-01-15 ENCOUNTER — Ambulatory Visit: Payer: Medicaid Other | Admitting: Clinical

## 2024-01-15 DIAGNOSIS — F909 Attention-deficit hyperactivity disorder, unspecified type: Secondary | ICD-10-CM

## 2024-01-15 DIAGNOSIS — Z658 Other specified problems related to psychosocial circumstances: Secondary | ICD-10-CM

## 2024-01-15 DIAGNOSIS — F4310 Post-traumatic stress disorder, unspecified: Secondary | ICD-10-CM | POA: Diagnosis not present
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
## 2024-01-17 NOTE — BH Specialist Note (Unsigned)
 Integrated Behavioral Health via Telemedicine Visit  01/17/2024 Rachel [name] [id number]  Number of Integrated Behavioral Health Clinician visits: Additional Visit  Session Start time: 5284   Session End time: 1020  Total time in minutes: 43   Referring Provider: Mariel Aloe, MD Patient/Family location: Home*** [facility] Provider location: Center for [facility] at [employer] [facility] for Women  All persons participating in visit: [name] and [name] ***  Types of Service: {CHL AMB TYPE OF SERVICE:[phone]}  I connected with [name] and/or [name]'s {family members:20773} via  Telephone or Engineer, civil (consulting)  (Video is Caregility application) and verified that I am speaking with the correct person using two identifiers. Discussed confidentiality: Yes   I discussed the limitations of telemedicine and the availability of in person appointments.  Discussed there is a possibility of technology failure and discussed alternative modes of communication if that failure occurs.  I discussed that engaging in this telemedicine visit, they consent to the provision of behavioral healthcare and the services will be billed under their insurance.  Patient and/or legal guardian expressed understanding and consented to Telemedicine visit: Yes   Presenting Concerns: Patient and/or family reports the following symptoms/concerns: *** Duration of problem: ***; Severity of problem: {Mild/Moderate/Severe:20260}  Patient and/or Family's Strengths/Protective Factors: {CHL AMB BH PROTECTIVE [phone]}  Goals Addressed: Patient will:  Reduce symptoms of: {IBH Symptoms:21014056}   Increase knowledge and/or ability of: {IBH Patient Tools:21014057}   Demonstrate ability to: {IBH Goals:21014053}  Progress towards Goals: {CHL AMB BH PROGRESS TOWARDS  GOALS:[phone]}  Interventions: Interventions utilized:  {IBH Interventions:21014054} Standardized Assessments completed: {IBH Screening Tools:21014051}  Patient and/or Family Response: Patient agrees with treatment plan. ***  Assessment: Patient currently experiencing ***.   Patient may benefit from continued therapeutic intervention *** .  Plan: Follow up with behavioral health clinician on : *** Behavioral recommendations:  -*** -*** Referral(s): {IBH Referrals:21014055}  I discussed the assessment and treatment plan with the patient and/or parent/guardian. They were provided an opportunity to ask questions and all were answered. They agreed with the plan and demonstrated an understanding of the instructions.   They were advised to call back or seek an in-person evaluation if the symptoms worsen or if the condition fails to improve as anticipated.  [name], LCSW     01/23/2024    9:08 AM 04/23/2023    8:50 AM 11/02/2022    9:23 AM 08/02/2022    2:20 PM 08/02/2022    2:11 PM  Depression screen PHQ 2/9  Decreased Interest 2 0 0 0 0  Down, Depressed, Hopeless 2 1 1  0 0  PHQ - 2 Score 4 1 1  0 0  Altered sleeping 3 2 1 2    Tired, decreased energy 3 2 1 2    Change in appetite 3 2 1 1    Feeling bad or failure about yourself  0 0 0 0   Trouble concentrating 2 1 0 1   Moving slowly or fidgety/restless 0 0 0 0   Suicidal thoughts 0 0 0 0   PHQ-9 Score 15 8 4 6        01/23/2024    9:10 AM 04/23/2023    8:51 AM 11/02/2022    9:24 AM 08/02/2022    2:20 PM  GAD 7 : Generalized Anxiety Score  Nervous, Anxious, on Edge 2 2 1  0  Control/stop worrying 2 1 0 0  Worry too much - different things 2 0 0 0  Trouble relaxing 0 0 1 1  Restless 0 0 0 0  Easily annoyed or irritable 0 0 0 0  Afraid - awful might happen 3 0 0 0  Total GAD 7 Score 9 3 2  1

## 2024-01-18 ENCOUNTER — Other Ambulatory Visit: Payer: Self-pay | Admitting: Family Medicine

## 2024-01-18 DIAGNOSIS — D509 Iron deficiency anemia, unspecified: Secondary | ICD-10-CM

## 2024-01-21 ENCOUNTER — Other Ambulatory Visit: Payer: Self-pay | Admitting: Family Medicine

## 2024-01-21 DIAGNOSIS — I700 Atherosclerosis of aorta: Secondary | ICD-10-CM

## 2024-01-21 MED ORDER — ATORVASTATIN CALCIUM 20 MG PO TABS: 20.0000 mg | ORAL_TABLET | Freq: Every day | ORAL | 0 refills | Status: DC

## 2024-01-21 NOTE — Telephone Encounter (Signed)
 Requested Prescriptions  Pending Prescriptions Disp Refills   atorvastatin (LIPITOR) 20 MG tablet 90 tablet 0    Sig: Take 1 tablet (20 mg total) by mouth daily.     Cardiovascular:  Antilipid - Statins Failed - 01/21/2024  3:21 PM      Failed - Lipid Panel in normal range within the last 12 months    Cholesterol, Total  Date Value Ref Range Status  04/23/2023 197 100 - 199 mg/dL Final   LDL Chol Calc (NIH)  Date Value Ref Range Status  04/23/2023 93 0 - 99 mg/dL Final   HDL  Date Value Ref Range Status  04/23/2023 91 >39 mg/dL Final   Triglycerides  Date Value Ref Range Status  04/23/2023 72 0 - 149 mg/dL Final         Passed - Patient is not pregnant      Passed - Valid encounter within last 12 months    Recent Outpatient Visits           9 months ago Myalgia   [facility] Of [place]. [facility] Hoy Register, MD   1 year ago Brain fog   [facility] - [facility] Dept Of [place]. [facility] Hoy Register, MD   1 year ago Aortic atherosclerosis [facility])   [facility] Dept Of [place]. [facility] Hoy Register, MD   1 year ago Recurrent pyelonephritis   [facility] - A Dept Of [place]. [facility] Hoy Register, MD   1 year ago Irritable bowel syndrome with both constipation and diarrhea   [facility] Dept Of [place]. [facility] Hoy Register, MD

## 2024-01-21 NOTE — Telephone Encounter (Signed)
 Copied from CRM [id number]. Topic: Clinical - Medication Refill >> Jan 21, 2024  2:10 PM Shon Hale wrote: Most Recent Primary Care Visit:  Provider: Hoy Register  Department: [facility]  Visit Type: OFFICE VISIT  Date: 04/23/2023  Medication: atorvastatin (LIPITOR) 20 MG tablet Patient  scheduled an appointment with Dr. Alvis Lemmings for 01/23/2024. Pt requested rx via pharmacy on 12/30/23 and called on 12/31/2023. Patient requesting refill as she has been out.   Has the patient contacted their pharmacy? Yes Contacted pharmacy told to contact office.   Is this the correct pharmacy for this prescription? Yes  This is the patient's preferred pharmacy:  [facility] [id number] [place], [place] - [address] [place] [address] Phone: [phone] Fax: [phone]   Has the prescription been filled recently? No  Is the patient out of the medication? Yes  Has the patient been seen for an appointment in the last year OR does the patient have an upcoming appointment? Yes  Can we respond through MyChart? Yes  Agent: Please be advised that Rx refills may take up to 3 business days. We ask that you follow-up with your pharmacy.

## 2024-01-23 ENCOUNTER — Ambulatory Visit: Payer: Medicaid Other | Attending: Family Medicine | Admitting: Family Medicine

## 2024-01-23 ENCOUNTER — Encounter: Payer: Self-pay | Admitting: Family Medicine

## 2024-01-23 VITALS — BP 124/85 | HR 63 | Ht 62.5 in | Wt 115.2 lb

## 2024-01-23 DIAGNOSIS — R9431 Abnormal electrocardiogram [ECG] [EKG]: Secondary | ICD-10-CM | POA: Diagnosis not present

## 2024-01-23 DIAGNOSIS — R109 Unspecified abdominal pain: Secondary | ICD-10-CM | POA: Diagnosis not present

## 2024-01-23 DIAGNOSIS — T7840XA Allergy, unspecified, initial encounter: Secondary | ICD-10-CM | POA: Diagnosis not present

## 2024-01-23 DIAGNOSIS — Z13228 Encounter for screening for other metabolic disorders: Secondary | ICD-10-CM

## 2024-01-23 DIAGNOSIS — G8929 Other chronic pain: Secondary | ICD-10-CM | POA: Diagnosis not present

## 2024-01-23 DIAGNOSIS — R55 Syncope and collapse: Secondary | ICD-10-CM

## 2024-01-23 DIAGNOSIS — R140 Abdominal distension (gaseous): Secondary | ICD-10-CM | POA: Diagnosis not present

## 2024-01-23 NOTE — Patient Instructions (Signed)
 VISIT SUMMARY:  During today's visit, we discussed your ongoing abdominal swelling and distention, episodes of near syncope, and recurrent allergic reactions. We reviewed your symptoms and planned further evaluations to better understand and manage your conditions.  YOUR PLAN:  -ABDOMINAL DISTENTION AND PAIN: Abdominal distention and pain can be caused by various conditions, including gastritis, gastroparesis, or an infection like Helicobacter pylori. We will test for Helicobacter pylori and refer you to a gastroenterologist for further evaluation.  -NEAR SYNCOPE: Near syncope refers to episodes where you feel like you might faint, often accompanied by a rapid heartbeat. We will refer you to a cardiologist for further evaluation to understand the cause of these episodes.  -ALLERGIC REACTIONS: Recurrent allergic reactions can cause symptoms like lightheadedness, runny nose, sneezing, hoarse throat, and hives. We will refer you to an allergy specialist to identify any potential triggers and manage your symptoms.  INSTRUCTIONS:  Please follow up with the gastroenterologist and cardiologist as soon as possible for further evaluation of your abdominal symptoms and near syncope episodes. Additionally, schedule an appointment with an allergy specialist to investigate your recurrent allergic reactions. Ensure you complete the Helicobacter pylori test as ordered.
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
## 2024-01-25 ENCOUNTER — Ambulatory Visit: Attending: Family Medicine

## 2024-01-25 ENCOUNTER — Encounter: Payer: Self-pay | Admitting: Family Medicine

## 2024-01-25 DIAGNOSIS — Z13228 Encounter for screening for other metabolic disorders: Secondary | ICD-10-CM | POA: Diagnosis not present

## 2024-01-25 LAB — H. PYLORI BREATH TEST: H pylori Breath Test: NEGATIVE

## 2024-01-25 LAB — H. PYLORI BREATH COLLECTION

## 2024-01-26 LAB — CMP14+EGFR
ALT: 11 IU/L (ref 0–32)
AST: 18 IU/L (ref 0–40)
Albumin: 4.8 g/dL (ref 3.9–4.9)
Alkaline Phosphatase: 68 IU/L (ref 44–121)
BUN/Creatinine Ratio: 13 (ref 9–23)
BUN: 9 mg/dL (ref 6–20)
Bilirubin Total: 0.3 mg/dL (ref 0.0–1.2)
CO2: 26 mmol/L (ref 20–29)
Calcium: 10.1 mg/dL (ref 8.7–10.2)
Chloride: 102 mmol/L (ref 96–106)
Creatinine, Ser: 0.71 mg/dL (ref 0.57–1.00)
Globulin, Total: 2.9 g/dL (ref 1.5–4.5)
Glucose: 90 mg/dL (ref 70–99)
Potassium: 4.2 mmol/L (ref 3.5–5.2)
Sodium: 141 mmol/L (ref 134–144)
Total Protein: 7.7 g/dL (ref 6.0–8.5)
eGFR: 113 mL/min/{1.73_m2} (ref >59)

## 2024-01-26 LAB — CBC WITH DIFFERENTIAL/PLATELET
Basophils Absolute: 0.1 10*3/uL (ref 0.0–0.2)
Basos: 1 %
EOS (ABSOLUTE): 0.3 10*3/uL (ref 0.0–0.4)
Eos: 5 %
Hematocrit: 43.4 % (ref 34.0–46.6)
Hemoglobin: 14.4 g/dL (ref 11.1–15.9)
Immature Grans (Abs): 0 10*3/uL (ref 0.0–0.1)
Immature Granulocytes: 0 %
Lymphocytes Absolute: 2.3 10*3/uL (ref 0.7–3.1)
Lymphs: 37 %
MCH: 31.1 pg (ref 26.6–33.0)
MCHC: 33.2 g/dL (ref 31.5–35.7)
MCV: 94 fL (ref 79–97)
Monocytes Absolute: 0.6 10*3/uL (ref 0.1–0.9)
Monocytes: 10 %
Neutrophils Absolute: 2.9 10*3/uL (ref 1.4–7.0)
Neutrophils: 47 %
Platelets: 236 10*3/uL (ref 150–450)
RBC: 4.63 x10E6/uL (ref 3.77–5.28)
RDW: 12.4 % (ref 11.7–15.4)
WBC: 6.2 10*3/uL (ref 3.4–10.8)

## 2024-01-26 LAB — LP+NON-HDL CHOLESTEROL
Cholesterol, Total: 184 mg/dL (ref 100–199)
HDL: 86 mg/dL (ref >39)
LDL Chol Calc (NIH): 80 mg/dL (ref 0–99)
Total Non-HDL-Chol (LDL+VLDL): 98 mg/dL (ref 0–129)
Triglycerides: 101 mg/dL (ref 0–149)
VLDL Cholesterol Cal: 18 mg/dL (ref 5–40)

## 2024-01-28 ENCOUNTER — Encounter: Payer: Self-pay | Admitting: Family Medicine

## 2024-01-31 ENCOUNTER — Ambulatory Visit: Payer: Medicaid Other | Admitting: Clinical

## 2024-01-31 DIAGNOSIS — F909 Attention-deficit hyperactivity disorder, unspecified type: Secondary | ICD-10-CM

## 2024-01-31 DIAGNOSIS — F4310 Post-traumatic stress disorder, unspecified: Secondary | ICD-10-CM

## 2024-01-31 DIAGNOSIS — Z658 Other specified problems related to psychosocial circumstances: Secondary | ICD-10-CM
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
## 2024-02-04 NOTE — BH Specialist Note (Unsigned)
 Integrated Behavioral Health via Telemedicine Visit  02/07/2024 Notnamed Croucher [id number]  Number of Integrated Behavioral Health Clinician visits: Additional Visit  Session Start time: 1519   Session End time: 1607  Total time in minutes: 48   Referring Provider: Mariel Aloe, MD Patient/Family location: Home [place] Provider location: Center for [facility] at [facility]  All persons participating in visit: [name] and [facility] Krystiana Fornes   Types of Service: Individual psychotherapy and Telephone visit  I connected with [name] and/or [name] Obi's  n/a  via  Telephone or Video Enabled Telemedicine Application  (Video is Caregility application) and verified that I am speaking with the correct person using two identifiers. Discussed confidentiality: Yes   I discussed the limitations of telemedicine and the availability of in person appointments.  Discussed there is a possibility of technology failure and discussed alternative modes of communication if that failure occurs.  I discussed that engaging in this telemedicine visit, they consent to the provision of behavioral healthcare and the services will be billed under their insurance.  Patient and/or legal guardian expressed understanding and consented to Telemedicine visit: Yes   Presenting Concerns: Patient and/or family reports the following symptoms/concerns: Unexpected loss of beloved aunt; worry over cousin's serious brain procedure taking place today; distress over feeling misunderstood by specific friends and their response to their own misunderstandings.  Duration of problem: Ongoing with recent loss and stressful events; Severity of problem: moderate  Patient and/or Family's Strengths/Protective Factors: Social connections, Concrete supports in place (healthy food, safe environments, etc.), and Sense of purpose  Goals  Addressed: Patient will:  Reduce symptoms of: anxiety, depression, and stress    Demonstrate ability to: Increase healthy adjustment to current life circumstances  Progress towards Goals: Ongoing  Interventions: Interventions utilized:  Supportive Reflection Standardized Assessments completed: Not Needed  Patient and/or Family Response: Patient agrees with treatment plan.  Assessment: Patient currently experiencing Grief; PTSD; ADHD; Psychosocial stress.   Patient may benefit from continued therapeutic intervention.  Plan: Follow up with behavioral health clinician on : One week Behavioral recommendations:  -Gather supportive, trustworthy friends this weekend; spend time with them as able; consider time in positive remembrance of aunt, allowing feelings of grief as they come -Consider setting boundaries, as needed, with unsupportive people in life; remember, some people are only in your life for a season, not for a lifetime, and that's okay Referral(s): Integrated [employer] (In Clinic)  I discussed the assessment and treatment plan with the patient and/or parent/guardian. They were provided an opportunity to ask questions and all were answered. They agreed with the plan and demonstrated an understanding of the instructions.   They were advised to call back or seek an in-person evaluation if the symptoms worsen or if the condition fails to improve as anticipated.  [name], LCSW     01/23/2024    9:08 AM 04/23/2023    8:50 AM 11/02/2022    9:23 AM 08/02/2022    2:20 PM 08/02/2022    2:11 PM  Depression screen PHQ 2/9  Decreased Interest 2 0 0 0 0  Down, Depressed, Hopeless 2 1 1  0 0  PHQ - 2 Score 4 1 1  0 0  Altered sleeping 3 2 1 2    Tired, decreased energy 3 2 1 2    Change in appetite 3 2 1 1    Feeling bad or failure about yourself  0 0 0 0   Trouble  concentrating 2 1 0 1   Moving slowly or fidgety/restless 0 0 0 0   Suicidal thoughts 0 0 0 0    PHQ-9 Score 15 8 4 6        01/23/2024    9:10 AM 04/23/2023    8:51 AM 11/02/2022    9:24 AM 08/02/2022    2:20 PM  GAD 7 : Generalized Anxiety Score  Nervous, Anxious, on Edge 2 2 1  0  Control/stop worrying 2 1 0 0  Worry too much - different things 2 0 0 0  Trouble relaxing 0 0 1 1  Restless 0 0 0 0  Easily annoyed or irritable 0 0 0 0  Afraid - awful might happen 3 0 0 0  Total GAD 7 Score 9 3 2  1

## 2024-02-06 ENCOUNTER — Ambulatory Visit: Payer: Medicaid Other | Admitting: Family Medicine

## 2024-02-07 ENCOUNTER — Ambulatory Visit: Admitting: Clinical

## 2024-02-07 DIAGNOSIS — Z658 Other specified problems related to psychosocial circumstances: Secondary | ICD-10-CM

## 2024-02-07 DIAGNOSIS — F4321 Adjustment disorder with depressed mood: Secondary | ICD-10-CM

## 2024-02-07 DIAGNOSIS — F4310 Post-traumatic stress disorder, unspecified: Secondary | ICD-10-CM

## 2024-02-07 DIAGNOSIS — F909 Attention-deficit hyperactivity disorder, unspecified type: Secondary | ICD-10-CM

## 2024-02-14 ENCOUNTER — Ambulatory Visit: Admitting: Clinical

## 2024-02-14 DIAGNOSIS — Z658 Other specified problems related to psychosocial circumstances: Secondary | ICD-10-CM

## 2024-02-14 DIAGNOSIS — F4321 Adjustment disorder with depressed mood: Secondary | ICD-10-CM

## 2024-02-14 DIAGNOSIS — F4310 Post-traumatic stress disorder, unspecified: Secondary | ICD-10-CM

## 2024-02-14 DIAGNOSIS — F909 Attention-deficit hyperactivity disorder, unspecified type: Secondary | ICD-10-CM

## 2024-02-16 ENCOUNTER — Other Ambulatory Visit: Payer: Self-pay | Admitting: Family Medicine

## 2024-02-16 DIAGNOSIS — D509 Iron deficiency anemia, unspecified: Secondary | ICD-10-CM

## 2024-02-18 ENCOUNTER — Other Ambulatory Visit: Payer: Self-pay | Admitting: Family Medicine

## 2024-02-18 DIAGNOSIS — I700 Atherosclerosis of aorta: Secondary | ICD-10-CM

## 2024-02-18 NOTE — Telephone Encounter (Signed)
 Requested medications are due for refill today.  yes  Requested medications are on the active medications list.  yes  Last refill. 01/18/2024 #30 0 rf  Future visit scheduled.   yes  Notes to clinic.  Labs are expired.    Requested Prescriptions  Pending Prescriptions Disp Refills   FEROSUL 325 (65 Fe) MG tablet [Pharmacy Med Name: FEROSUL 325 MG TABLET] 30 tablet 0    Sig: TAKE 1 TABLET BY MOUTH DAILY     Endocrinology:  Minerals - Iron Supplementation Failed - 02/18/2024  4:10 PM      Failed - Fe (serum) in normal range and within 360 days    Iron  Date Value Ref Range Status  01/19/2022 12 (L) 28 - 170 ug/dL Final   Saturation Ratios  Date Value Ref Range Status  01/19/2022 6 (L) 10.4 - 31.8 % Final         Failed - Ferritin in normal range and within 360 days    Ferritin  Date Value Ref Range Status  01/19/2022 157 11 - 307 ng/mL Final    Comment:    Performed at [facility], [address] [place], [place] [address]         Passed - HGB in normal range and within 360 days    Hemoglobin  Date Value Ref Range Status  01/25/2024 14.4 11.1 - 15.9 g/dL Final         Passed - HCT in normal range and within 360 days    Hematocrit  Date Value Ref Range Status  01/25/2024 43.4 34.0 - 46.6 % Final         Passed - RBC in normal range and within 360 days    RBC  Date Value Ref Range Status  01/25/2024 4.63 3.77 - 5.28 x10E6/uL Final  01/22/2023 3.77 (L) 3.87 - 5.11 MIL/uL Final         Passed - Valid encounter within last 12 months    Recent Outpatient Visits           3 weeks ago Near syncope   [facility] [place]. [facility] Hoy Register, MD   10 months ago Myalgia   [facility] Of [place]. [facility] Hoy Register, MD   1 year ago Brain fog   [facility] Of [place]. [facility] Hoy Register, MD   1 year ago  Aortic atherosclerosis [facility])   [facility] [place]. [facility] Hoy Register, MD   1 year ago Recurrent pyelonephritis   [facility] Of [place]. [facility] Hoy Register, MD       Future Appointments             In 2 months Skains, Veverly Fells, MD [facility] [place]   In 5 months Hoy Register, MD [facility] - A Dept Of Eligha Brideg[facility]. [facility]
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
## 2024-03-03 ENCOUNTER — Ambulatory Visit: Admitting: Clinical

## 2024-03-03 DIAGNOSIS — F4310 Post-traumatic stress disorder, unspecified: Secondary | ICD-10-CM | POA: Diagnosis not present

## 2024-03-03 DIAGNOSIS — Z658 Other specified problems related to psychosocial circumstances: Secondary | ICD-10-CM

## 2024-03-03 DIAGNOSIS — F909 Attention-deficit hyperactivity disorder, unspecified type: Secondary | ICD-10-CM
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
## 2024-03-13 ENCOUNTER — Other Ambulatory Visit: Payer: Self-pay | Admitting: Allergy

## 2024-03-17 ENCOUNTER — Ambulatory Visit: Admitting: Clinical

## 2024-03-17 DIAGNOSIS — F4310 Post-traumatic stress disorder, unspecified: Secondary | ICD-10-CM

## 2024-03-17 DIAGNOSIS — F909 Attention-deficit hyperactivity disorder, unspecified type: Secondary | ICD-10-CM

## 2024-03-17 DIAGNOSIS — Z658 Other specified problems related to psychosocial circumstances: Secondary | ICD-10-CM

## 2024-03-19 ENCOUNTER — Other Ambulatory Visit: Payer: Self-pay | Admitting: Obstetrics and Gynecology

## 2024-03-19 DIAGNOSIS — Z90710 Acquired absence of both cervix and uterus: Secondary | ICD-10-CM
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
## 2024-03-26 ENCOUNTER — Other Ambulatory Visit: Payer: Self-pay

## 2024-03-26 ENCOUNTER — Ambulatory Visit: Admitting: Allergy

## 2024-03-26 ENCOUNTER — Encounter: Payer: Self-pay | Admitting: Allergy

## 2024-03-26 VITALS — BP 110/80 | HR 66 | Temp 97.7°F | Resp 16 | Ht 62.0 in | Wt 112.7 lb

## 2024-03-26 DIAGNOSIS — J4520 Mild intermittent asthma, uncomplicated: Secondary | ICD-10-CM | POA: Diagnosis not present

## 2024-03-26 DIAGNOSIS — T7840XD Allergy, unspecified, subsequent encounter: Secondary | ICD-10-CM

## 2024-03-26 DIAGNOSIS — T781XXD Other adverse food reactions, not elsewhere classified, subsequent encounter: Secondary | ICD-10-CM

## 2024-03-26 DIAGNOSIS — L509 Urticaria, unspecified: Secondary | ICD-10-CM | POA: Diagnosis not present

## 2024-03-26 DIAGNOSIS — R202 Paresthesia of skin: Secondary | ICD-10-CM

## 2024-03-26 DIAGNOSIS — T783XXD Angioneurotic edema, subsequent encounter: Secondary | ICD-10-CM

## 2024-03-26 DIAGNOSIS — Z889 Allergy status to unspecified drugs, medicaments and biological substances status: Secondary | ICD-10-CM | POA: Diagnosis not present

## 2024-03-26 DIAGNOSIS — R200 Anesthesia of skin: Secondary | ICD-10-CM

## 2024-03-26 MED ORDER — CROMOLYN SODIUM 4 % OP SOLN: 1.0000 [drp] | Freq: Four times a day (QID) | OPHTHALMIC | 3 refills | Status: AC | PRN

## 2024-03-26 MED ORDER — BUDESONIDE-FORMOTEROL FUMARATE 80-4.5 MCG/ACT IN AERO: 2.0000 | INHALATION_SPRAY | Freq: Two times a day (BID) | RESPIRATORY_TRACT | 3 refills | Status: AC

## 2024-03-26 MED ORDER — ALBUTEROL SULFATE HFA 108 (90 BASE) MCG/ACT IN AERS: 2.0000 | INHALATION_SPRAY | RESPIRATORY_TRACT | 1 refills | Status: AC | PRN

## 2024-03-26 MED ORDER — FAMOTIDINE 20 MG PO TABS: 20.0000 mg | ORAL_TABLET | Freq: Two times a day (BID) | ORAL | 2 refills | Status: DC

## 2024-03-26 MED ORDER — FEXOFENADINE HCL 180 MG PO TABS: 180.0000 mg | ORAL_TABLET | Freq: Two times a day (BID) | ORAL | 2 refills | Status: DC

## 2024-03-26 NOTE — Patient Instructions (Addendum)
 Environmental allergies 2024 skin testing positive to mold, cat and dog. Continue environmental control measures as below. Use over the counter antihistamines such as Zyrtec  (cetirizine ), Claritin  (loratadine ), Allegra (fexofenadine), or Xyzal (levocetirizine) daily as needed. May take twice a day during allergy  flares. May switch antihistamines every few months. Nasal saline spray (i.e., Simply Saline) or nasal saline lavage (i.e., NeilMed) is recommended as needed and prior to medicated nasal sprays. Use cromolyn  4% 1 drop in each eye up to four times a day as needed for itchy/watery eyes.  Wait 10-15 min before putting your contact lens in.  Restart allergy  injections.   Skin/hives/swelling Continue proper skin care. Keep track of outbreaks and take pictures. Increase allegra 180mg  to twice a day.  Start famotidine 20mg  twice a day.  If symptoms are not controlled or causes drowsiness let us  know. Avoid the following potential triggers: alcohol, tight clothing, NSAIDs, hot showers and getting overheated. Get bloodwork We are ordering labs, so please allow 1-2 weeks for the results to come back. With the newly implemented Cures Act, the labs might be visible to you at the same time that they become visible to me. However, I will not address the results until all of the results are back, so please be patient.  In the meantime, continue recommendations in your patient instructions, including avoidance measures (if applicable), until you hear from me.  Asthma During respiratory infections/flares:  Start Symbicort 80mcg 2 puffs twice a day and rinse mouth after each use for 1-2 weeks until your breathing symptoms return to baseline.  Pretreat with albuterol  2 puffs or albuterol  nebulizer.  If you need to use your albuterol  nebulizer machine back to back within 15-30 minutes with no relief then please go to the ER/urgent care for further evaluation.  May use albuterol  rescue inhaler 2 puffs or  nebulizer every 4 to 6 hours as needed for shortness of breath, chest tightness, coughing, and wheezing. May use albuterol  rescue inhaler 2 puffs 5 to 15 minutes prior to strenuous physical activities. Monitor frequency of use - if you need to use it more than twice per week on a consistent basis let us  know.  Breathing control goals:  Full participation in all desired activities (may need albuterol  before activity) Albuterol  use two times or less a week on average (not counting use with activity) Cough interfering with sleep two times or less a month Oral steroids no more than once a year No hospitalizations  Allergic reactions Keep track of symptoms and episodes.  For mild symptoms you can take over the counter antihistamines such as Benadryl  1-2 tablets = 25-50mg  and monitor symptoms closely. If symptoms worsen or if you have severe symptoms including breathing issues, throat closure, significant swelling, whole body hives, severe diarrhea and vomiting, lightheadedness then inject epinephrine  and seek immediate medical care afterwards. Emergency action plan in place.   Food Avoid pork.  Multiple drug allergies Avoid drugs on allergy  list.  Recurrent infections Keep track of infections and antibiotics use.  Numbness and tingling Monitor symptoms.  Consider follow up with PCP regarding these symptoms.  Follow up in 3 months or sooner if needed.    Pet Allergen Avoidance: Contrary to popular opinion, there are no "hypoallergenic" breeds of dogs or cats. That is because people are not allergic to an animal's hair, but to an allergen found in the animal's saliva, dander (dead skin flakes) or urine. Pet allergy  symptoms typically occur within minutes. For some people, symptoms can build up and  become most severe 8 to 12 hours after contact with the animal. People with severe allergies can experience reactions in public places if dander has been transported on the pet owners'  clothing. Keeping an animal outdoors is only a partial solution, since homes with pets in the yard still have higher concentrations of animal allergens. Before getting a pet, ask your allergist to determine if you are allergic to animals. If your pet is already considered part of your family, try to minimize contact and keep the pet out of the bedroom and other rooms where you spend a great deal of time. As with dust mites, vacuum carpets often or replace carpet with a hardwood floor, tile or linoleum. High-efficiency particulate air (HEPA) cleaners can reduce allergen levels over time. While dander and saliva are the source of cat and dog allergens, urine is the source of allergens from rabbits, hamsters, mice and [place] pigs; so ask a non-allergic family member to clean the animal's cage. If you have a pet allergy , talk to your allergist about the potential for allergy  immunotherapy (allergy  shots). This strategy can often provide long-term relief. Mold Control Mold and fungi can grow on a variety of surfaces provided certain temperature and moisture conditions exist.  Outdoor molds grow on plants, decaying vegetation and soil. The major outdoor mold, Alternaria and Cladosporium, are found in very high numbers during hot and dry conditions. Generally, a late summer - fall peak is seen for common outdoor fungal spores. Rain will temporarily lower outdoor mold spore count, but counts rise rapidly when the rainy period ends. The most important indoor molds are Aspergillus and Penicillium. Dark, humid and poorly ventilated basements are ideal sites for mold growth. The next most common sites of mold growth are the bathroom and the kitchen. Outdoor (Seasonal) Mold Control Use air conditioning and keep windows closed. Avoid exposure to decaying vegetation. Avoid leaf raking. Avoid grain handling. Consider wearing a face mask if working in moldy areas.  Indoor (Perennial) Mold Control  Maintain humidity  below 50%. Get rid of mold growth on hard surfaces with water , detergent and, if necessary, 5% bleach (do not mix with other cleaners). Then dry the area completely. If mold covers an area more than 10 square feet, consider hiring an indoor environmental professional. For clothing, washing with soap and water  is best. If moldy items cannot be cleaned and dried, throw them away. Remove sources e.g. contaminated carpets. Repair and seal leaking roofs or pipes. Using dehumidifiers in damp basements may be helpful, but empty the water  and clean units regularly to prevent mildew from forming. All rooms, especially basements, bathrooms and kitchens, require ventilation and cleaning to deter mold and mildew growth. Avoid carpeting on concrete or damp floors, and storing items in damp areas.   Skin care recommendations  Bath time: Always use lukewarm water . AVOID very hot or cold water . Keep bathing time to 5-10 minutes. Do NOT use bubble bath. Use a mild soap and use just enough to wash the dirty areas. Do NOT scrub skin vigorously.  After bathing, pat dry your skin with a towel. Do NOT rub or scrub the skin.  Moisturizers and prescriptions:  ALWAYS apply moisturizers immediately after bathing (within 3 minutes). This helps to lock-in moisture. Use the moisturizer several times a day over the whole body. Good summer moisturizers include: Aveeno, CeraVe, Cetaphil. Good winter moisturizers include: Aquaphor, Vaseline, Cerave, Cetaphil, Eucerin, Vanicream. When using moisturizers along with medications, the moisturizer should be applied about one hour  after applying the medication to prevent diluting effect of the medication or moisturize around where you applied the medications. When not using medications, the moisturizer can be continued twice daily as maintenance.  Laundry and clothing: Avoid laundry products with added color or perfumes. Use unscented hypo-allergenic laundry products such as Tide  free, Cheer free & gentle, and All free and clear.  If the skin still seems dry or sensitive, you can try double-rinsing the clothes. Avoid tight or scratchy clothing such as wool. Do not use fabric softeners or dyer sheets.

## 2024-03-26 NOTE — Progress Notes (Signed)
 VIALS MADE 03-26-24

## 2024-03-26 NOTE — Progress Notes (Unsigned)
 Follow Up Note  RE: Rachel [name] MRN: [id number] DOB: 1986/01/11 Date of Office Visit: 03/26/2024  Referring provider: Joaquin Mulberry, MD Primary care provider: Joaquin Mulberry, MD  Chief Complaint: Follow-up (Asthma/Allergies Rachel [name] she had unknown tingling feeling in month.), Angioedema, Urticaria, Pruritus, and Immunotherapy (Wants to restart )  History of Present Illness: I had the pleasure of seeing Rachel [name] for a follow up visit at the [facility] on 03/27/2024. She is a 38 y.o. female, who is being followed for allergic rhinitis, rash, allergic reactions, recurrent infections, asthma, multiple drug allergies and adverse food reactions. Her previous allergy  office visit was on 03/19/2023 with Dr. Burdette Carolin. Today is a regular follow up visit.  Discussed the use of AI scribe software for clinical note transcription with the patient, who gave verbal consent to proceed.    Since October or November of last year, she has experienced recurrent episodes of numbness and tingling around the mouth, a sensation of skin crawling, and hives on the abdomen and chest. These episodes occur without a discernible common factor and have happened in various locations, including [place], [place], and her current residence. She takes Benadryl  and Pepcid during these episodes. Despite these medications, she experiences swelling and water  retention that takes about five days to subside after an episode.  She has had to use her inhaler more frequently, about once a week during these episodes, and sometimes requires three puffs for relief. She has not been hospitalized for breathing issues but has experienced asthma exacerbations when sick with respiratory infections.  She avoids pork due to gastroparesis and limits dairy and certain raw vegetables. She has had bronchitis and other respiratory infections requiring antibiotics but has not been hospitalized since her  gastropexy surgery.     Assessment and Plan: Rachel [name] is a 38 y.o. female with: Other allergic rhinitis Past history - symptoms which flares in the fall and winter. Sinus surgery in the past.  2 dogs and 2 cats at home. 2024 skin prick testing positive to mold, cat and dog only. 2024 bloodwork positive to cat and dog. Started AIT on 01/04/2023 (M-C-D). Flonase  caused epistaxis.  Interim history - last injection in June 2024 as patient moved out of the area. Wants to restart injections.  Continue environmental control measures as below. Use over the counter antihistamines such as Zyrtec  (cetirizine ), Claritin  (loratadine ), Allegra (fexofenadine), or Xyzal (levocetirizine) daily as needed. May take twice a day during allergy  flares. May switch antihistamines every few months. Nasal saline spray (i.e., Simply Saline) or nasal saline lavage (i.e., NeilMed) is recommended as needed and prior to medicated nasal sprays. Use cromolyn  4% 1 drop in each eye up to four times a day as needed for itchy/watery eyes.  Wait 10-15 min before putting your contact lens in.  Restart allergy  injections.    Rash and other nonspecific skin eruption Past history - breaking out in random itchy rashes since 2015. No specific triggers noted but has a flare every 1-2 months. Symptoms seems to lasts for a few weeks at a time. History of eczema. No prior dermatology evaluation. 2024 bloodwork (CBC diff, CMP, TSH, ANA, crp, esr, CU, tryptase and alpha-gal) normal.  Interim history - unchanged hives with no known triggers. Change in environment didn't improve symptoms.  Continue proper skin care. Keep track of outbreaks and take pictures. Increase allegra 180mg  to twice a day.  Start famotidine 20mg  twice a day.  If symptoms are not controlled or causes drowsiness  let us  know. Avoid the following potential triggers: alcohol, tight clothing, NSAIDs, hot showers and getting overheated. Get bloodwork.    Allergic  reaction Past history - multiple reaction to various medications but then has milder allergic reactions with no known triggers. Interim history - no major reaction requiring epinephrine .  Keep track of symptoms and episodes.  For mild symptoms you can take over the counter antihistamines such as Benadryl  1-2 tablets = 25-50mg  and monitor symptoms closely. If symptoms worsen or if you have severe symptoms including breathing issues, throat closure, significant swelling, whole body hives, severe diarrhea and vomiting, lightheadedness then inject epinephrine  and seek immediate medical care afterwards. Emergency action plan in place.    Other adverse food reactions, not elsewhere classified, subsequent encounter Past history - pork causes perioral sores and tingling. Dairy flares her GI symptoms. 2024 skin testing negative to select foods. Avoid pork and dairy. She may have lactose intolerance.    Recurrent infections Past history - sepsis x 2,  pneumonia, ear infections. Usually needs 2-4 courses of antibiotics per year. 2024 normal immunglobulin levels, tetanus, diphtheria and pneumococcal titers protective.   Keep track of infections and antibiotics use.   Mild intermittent asthma without complication Past history -  usually flares with cold weather and infection. Tried Singulair and Advair in the past.  2024 spirometry was normal. Interim history - flares with hives/itching episodes.  Today's spirometry was normal. During respiratory infections/flares:  Start Symbicort 80mcg 2 puffs twice a day and rinse mouth after each use for 1-2 weeks until your breathing symptoms return to baseline.  Pretreat with albuterol  2 puffs or albuterol  nebulizer.  If you need to use your albuterol  nebulizer machine back to back within 15-30 minutes with no relief then please go to the ER/urgent care for further evaluation.  May use albuterol  rescue inhaler 2 puffs or nebulizer every 4 to 6 hours as needed for  shortness of breath, chest tightness, coughing, and wheezing. May use albuterol  rescue inhaler 2 puffs 5 to 15 minutes prior to strenuous physical activities. Monitor frequency of use - if you need to use it more than twice per week on a consistent basis let us  know.    Multiple drug allergies Past history - reactions to multiple drugs. Orilissa  required ER visit with IM epi. Penicillin reaction as a young child in the form of rash/hives. Avoid drugs on allergy  list.  Numbness and tingling Monitor symptoms.  Consider follow up with PCP regarding these symptoms.   Return in about 3 months (around 06/26/2024).  Meds ordered this encounter  Medications   famotidine (PEPCID) 20 MG tablet    Sig: Take 1 tablet (20 mg total) by mouth 2 (two) times daily.    Dispense:  60 tablet    Refill:  2   fexofenadine (ALLEGRA ALLERGY ) 180 MG tablet    Sig: Take 1 tablet (180 mg total) by mouth in the morning and at bedtime.    Dispense:  60 tablet    Refill:  2   albuterol  (VENTOLIN  HFA) 108 (90 Base) MCG/ACT inhaler    Sig: Inhale 2 puffs into the lungs every 4 (four) hours as needed for wheezing or shortness of breath (coughing fits).    Dispense:  18 g    Refill:  1   budesonide-formoterol (SYMBICORT) 80-4.5 MCG/ACT inhaler    Sig: Inhale 2 puffs into the lungs in the morning and at bedtime. Rinse mouth after each use. Use for 1-2 weeks during asthma flares.  Dispense:  1 each    Refill:  3   cromolyn  (OPTICROM ) 4 % ophthalmic solution    Sig: Place 1 drop into both eyes 4 (four) times daily as needed (itchy/watery eyes).    Dispense:  10 mL    Refill:  3   Lab Orders         Allergen, Pork, f26         ANA, IFA (with reflex)         C1 Esterase Inhibitor         C1 esterase inhibitor, functional         C3 and C4         CBC with Differential/Platelet         Chronic Urticaria         Comprehensive metabolic panel with GFR         C-reactive protein         Tryptase         Thyroid   Cascade Profile         Sedimentation rate      Diagnostics: Spirometry:  Tracings reviewed. Her effort: Good reproducible efforts. FVC: 3.17L FEV1: 2.85L, 98% predicted FEV1/FVC ratio: 90% Interpretation: Spirometry consistent with normal pattern.  Please see scanned spirometry results for details.  Results discussed with patient/family.   Medication List:  Current Outpatient Medications  Medication Sig Dispense Refill   acetaminophen  (TYLENOL ) 500 MG tablet Take 500-1,000 mg by mouth every 6 (six) hours as needed for moderate pain or mild pain.     albuterol  (VENTOLIN  HFA) 108 (90 Base) MCG/ACT inhaler Inhale 2 puffs into the lungs every 4 (four) hours as needed for wheezing or shortness of breath (coughing fits). 18 g 1   atorvastatin  (LIPITOR) 20 MG tablet Take 1 tablet (20 mg total) by mouth daily. 90 tablet 1   budesonide-formoterol (SYMBICORT) 80-4.5 MCG/ACT inhaler Inhale 2 puffs into the lungs in the morning and at bedtime. Rinse mouth after each use. Use for 1-2 weeks during asthma flares. 1 each 3   Calcium -Magnesium-Vitamin D  600-40-500 MG-MG-UNIT TB24 Take 1 tablet by mouth every evening.     cromolyn  (OPTICROM ) 4 % ophthalmic solution Place 1 drop into both eyes 4 (four) times daily as needed (itchy/watery eyes). 10 mL 3   diphenhydrAMINE  (BENADRYL ) 25 MG tablet Take 25 mg by mouth every 6 (six) hours as needed for allergies or itching.     EPINEPHRINE  0.3 mg/0.3 mL IJ SOAJ injection INJECT INTO THE MIDDLE OF THE OUTER THIGH AND HOLD FOR 3 SECONDS AS NEEDED FOR SEVERE ALLERGIC REACTION THEN CALL 911 IF USED. 2 each 1   famotidine (PEPCID) 20 MG tablet Take 1 tablet (20 mg total) by mouth 2 (two) times daily. 60 tablet 2   ferrous sulfate  (FEROSUL) 325 (65 FE) MG tablet TAKE 1 TABLET BY MOUTH DAILY 90 tablet 1   fexofenadine (ALLEGRA ALLERGY ) 180 MG tablet Take 1 tablet (180 mg total) by mouth in the morning and at bedtime. 60 tablet 2   hydrocortisone cream 1 % Apply 1  Application topically 2 (two) times daily.     ibuprofen  (ADVIL ) 200 MG tablet Take 400-800 mg by mouth every 8 (eight) hours as needed (pain.).     linaclotide  (LINZESS ) 290 MCG CAPS capsule Take 1 capsule (290 mcg total) by mouth daily before breakfast. 30 capsule 3   Melatonin 10 MG TABS Take 10 mg by mouth at bedtime.     MIRALAX  17 GM/SCOOP  powder Take 17-34 g by mouth daily. Mix 17 g of powder into water  and drink once a day     PREMARIN  0.625 MG tablet TAKE 1 TABLET BY MOUTH DAILY FOR 21 DAYS. THEN DO NOT TAKE FOR 7 DAYS 30 tablet 11   Prenatal Vit-Fe Fumarate-FA (MULTIVITAMIN-PRENATAL) 27-0.8 MG TABS tablet Take 1 tablet by mouth daily at 12 noon.     promethazine  (PHENERGAN ) 12.5 MG tablet Take 1 tablet (12.5 mg total) by mouth every 8 (eight) hours as needed for nausea. 30 tablet 1   rizatriptan  (MAXALT -MLT) 10 MG disintegrating tablet May repeat in 2 hours if needed 10 tablet 5   sennosides-docusate sodium  (SENOKOT-S) 8.6-50 MG tablet Take 2-3 tablets by mouth daily as needed for constipation.     triamcinolone  cream (KENALOG ) 0.1 % Apply 1 Application topically 2 (two) times daily as needed (rash, itching). 45 g 1   No current facility-administered medications for this visit.   Allergies: Allergies  Allergen Reactions   Amitriptyline  Anaphylaxis   Amphetamine-Dextroamphetamine Other (See Comments)    Triggered bipolar, caused depression, and suicidal thoughts   Lupron [Leuprolide] Anaphylaxis   Orilissa  [Elagolix] Anaphylaxis   Penicillins Anaphylaxis, Swelling and Other (See Comments)    Potential for anaphylaxis confirmed. Tolerates Cefphalosporins No "-cillins"!!!! Did it involve swelling of the face/tongue/throat, SOB, or low BP? Yes Did it involve sudden or severe rash/hives, skin peeling, or any reaction on the inside of your mouth or nose? Unknown Did you need to seek medical attention at a hospital or doctor's office? Unknown When did it last happen? teenager      If  all above answers are "NO", may proceed with cephalosporin use.   Wound Dressing Adhesive Rash   Betadine  [Povidone-Iodine ] Swelling and Rash   Chlorhexidine  Hives    DuraPrep    Doxycycline Itching, Nausea And Vomiting and Other (See Comments)    Caused high fever, also   Mixed Ragweed Other (See Comments)    Unknown, Mother and pt state she can't have    Olanzapine Other (See Comments)    Caused mania   Other Other (See Comments)    Stimulants, mood stabilizers, and antidepressants = Triggered bipolar, caused depression, and suicidal thoughts   Prozac [Fluoxetine Hcl] Other (See Comments)    Triggered bipolar, caused depression, and suicidal thoughts   Stevia Glycerite Extract [Flavoring Agent (Non-Screening)] Other (See Comments)    Unknown reaction; mother and pt confirm any Artificial Sweeteners cause reaction   Tape Other (See Comments)    Paper tape is tolerated   Triple Antibiotic Pain Relief [Neomy-Bacit-Polymyx-Pramoxine] Hives   Reglan  [Metoclopramide ] Anxiety and Other (See Comments)    Triggered bipolar, caused depression, suicidal thoughts, muscle twitching, stiffness, and high anxiety    I reviewed her past medical history, social history, family history, and environmental history and no significant changes have been reported from her previous visit.  Review of Systems  Constitutional:  Negative for appetite change, chills, fever and unexpected weight change.  HENT:  Negative for congestion and rhinorrhea.   Eyes:  Negative for itching.  Respiratory:  Negative for cough, chest tightness, shortness of breath and wheezing.   Cardiovascular:  Negative for chest pain.  Genitourinary:  Negative for difficulty urinating.  Skin:  Positive for rash.  Allergic/Immunologic: Positive for environmental allergies.    Objective: BP 110/80   Pulse 66   Temp 97.7 F (36.5 C)   Resp 16   Ht 5\' 2"  (1.575 m)   Wt 112  lb 11.2 oz (51.1 kg)   LMP  (LMP Unknown)   SpO2 97%   BMI  20.61 kg/m  Body mass index is 20.61 kg/m. Physical Exam Vitals and nursing note reviewed.  Constitutional:      Appearance: Normal appearance. She is well-developed.  HENT:     Head: Normocephalic and atraumatic.     Right Ear: Tympanic membrane and external ear normal.     Left Ear: Tympanic membrane and external ear normal.     Nose: Nose normal.     Mouth/Throat:     Mouth: Mucous membranes are moist.     Pharynx: Oropharynx is clear.  Eyes:     Conjunctiva/sclera: Conjunctivae normal.  Cardiovascular:     Rate and Rhythm: Normal rate and regular rhythm.     Heart sounds: Normal heart sounds. No murmur heard.    No friction rub. No gallop.  Pulmonary:     Effort: Pulmonary effort is normal.     Breath sounds: Normal breath sounds. No wheezing, rhonchi or rales.  Musculoskeletal:     Cervical back: Neck supple.  Skin:    General: Skin is warm.     Findings: No rash.  Neurological:     Mental Status: She is alert and oriented to person, place, and time.  Psychiatric:        Behavior: Behavior normal.   Previous notes and tests were reviewed. The plan was reviewed with the patient/family, and all questions/concerned were addressed.  It was my pleasure to see Rachel [name] today and participate in her care. Please feel free to contact me with any questions or concerns.  Sincerely,  Eudelia Hero, DO Allergy  & Immunology  [facility]  [place] office: [phone] [facility] office: [phone]

## 2024-03-27 ENCOUNTER — Encounter: Payer: Self-pay | Admitting: Allergy

## 2024-03-27 DIAGNOSIS — J3081 Allergic rhinitis due to animal (cat) (dog) hair and dander: Secondary | ICD-10-CM | POA: Diagnosis not present

## 2024-04-02 ENCOUNTER — Ambulatory Visit: Admitting: Clinical

## 2024-04-02 DIAGNOSIS — Z658 Other specified problems related to psychosocial circumstances: Secondary | ICD-10-CM

## 2024-04-02 DIAGNOSIS — F4310 Post-traumatic stress disorder, unspecified: Secondary | ICD-10-CM | POA: Diagnosis not present

## 2024-04-02 DIAGNOSIS — F909 Attention-deficit hyperactivity disorder, unspecified type: Secondary | ICD-10-CM | POA: Diagnosis not present

## 2024-04-02 DIAGNOSIS — F439 Reaction to severe stress, unspecified: Secondary | ICD-10-CM | POA: Diagnosis not present

## 2024-04-04 ENCOUNTER — Ambulatory Visit: Payer: Self-pay | Admitting: Allergy

## 2024-04-04 ENCOUNTER — Telehealth: Payer: Self-pay | Admitting: Allergy

## 2024-04-04 LAB — CBC WITH DIFFERENTIAL/PLATELET
Basophils Absolute: 0.1 10*3/uL (ref 0.0–0.2)
Basos: 1 %
EOS (ABSOLUTE): 0.3 10*3/uL (ref 0.0–0.4)
Eos: 5 %
Hematocrit: 40.8 % (ref 34.0–46.6)
Hemoglobin: 13.2 g/dL (ref 11.1–15.9)
Immature Grans (Abs): 0 10*3/uL (ref 0.0–0.1)
Immature Granulocytes: 0 %
Lymphocytes Absolute: 2.2 10*3/uL (ref 0.7–3.1)
Lymphs: 42 %
MCH: 31.6 pg (ref 26.6–33.0)
MCHC: 32.4 g/dL (ref 31.5–35.7)
MCV: 98 fL — ABNORMAL HIGH (ref 79–97)
Monocytes Absolute: 0.4 10*3/uL (ref 0.1–0.9)
Monocytes: 7 %
Neutrophils Absolute: 2.4 10*3/uL (ref 1.4–7.0)
Neutrophils: 45 %
Platelets: 238 10*3/uL (ref 150–450)
RBC: 4.18 x10E6/uL (ref 3.77–5.28)
RDW: 13.1 % (ref 11.7–15.4)
WBC: 5.3 10*3/uL (ref 3.4–10.8)

## 2024-04-04 LAB — COMPREHENSIVE METABOLIC PANEL WITH GFR
ALT: 19 IU/L (ref 0–32)
AST: 24 IU/L (ref 0–40)
Albumin: 4.5 g/dL (ref 3.9–4.9)
Alkaline Phosphatase: 62 IU/L (ref 44–121)
BUN/Creatinine Ratio: 13 (ref 9–23)
BUN: 10 mg/dL (ref 6–20)
Bilirubin Total: 0.2 mg/dL (ref 0.0–1.2)
CO2: 23 mmol/L (ref 20–29)
Calcium: 9.8 mg/dL (ref 8.7–10.2)
Chloride: 104 mmol/L (ref 96–106)
Creatinine, Ser: 0.78 mg/dL (ref 0.57–1.00)
Globulin, Total: 2.5 g/dL (ref 1.5–4.5)
Glucose: 69 mg/dL — ABNORMAL LOW (ref 70–99)
Potassium: 4 mmol/L (ref 3.5–5.2)
Sodium: 141 mmol/L (ref 134–144)
Total Protein: 7 g/dL (ref 6.0–8.5)
eGFR: 100 mL/min/{1.73_m2} (ref >59)

## 2024-04-04 LAB — C3 AND C4
Complement C3, Serum: 93 mg/dL (ref 82–167)
Complement C4, Serum: 15 mg/dL (ref 12–38)

## 2024-04-04 LAB — FANA STAINING PATTERNS: Speckled Pattern: 1:320 {titer} — ABNORMAL HIGH

## 2024-04-04 LAB — THYROID CASCADE PROFILE: TSH: 3.24 u[IU]/mL (ref 0.450–4.500)

## 2024-04-04 LAB — SEDIMENTATION RATE: Sed Rate: 3 mm/h (ref 0–32)

## 2024-04-04 LAB — TRYPTASE: Tryptase: 6.6 ug/L (ref 2.2–13.2)

## 2024-04-04 LAB — ALLERGEN, PORK, F26: Pork IgE: <0.1 kU/L

## 2024-04-04 LAB — C-REACTIVE PROTEIN: CRP: <1 mg/L (ref 0–10)

## 2024-04-04 LAB — C1 ESTERASE INHIBITOR, FUNCTIONAL: C1INH Functional/C1INH Total MFr SerPl: >110 %{normal}

## 2024-04-04 LAB — ANTINUCLEAR ANTIBODIES, IFA: ANA Titer 1: POSITIVE — AB

## 2024-04-04 LAB — C1 ESTERASE INHIBITOR: C1INH SerPl-mCnc: 32 mg/dL (ref 21–39)

## 2024-04-04 LAB — CHRONIC URTICARIA: cu index: 5.8 (ref <10)

## 2024-04-04 NOTE — Telephone Encounter (Signed)
 Please place referral to rheumatology for elevated ANA 1:320 speckled pattern.   Thank you.

## 2024-04-09 ENCOUNTER — Telehealth: Payer: Self-pay | Admitting: Allergy

## 2024-04-09 NOTE — Telephone Encounter (Signed)
 Please call patient.  The swelling and water  retention is not typical of allergies and the benadryl  won't help that.   I can see patient tomorrow in OR office or Friday in [place] at 1:30PM if she can wait.  Otherwise, she can also reach out to her PCP to be seen sooner.

## 2024-04-09 NOTE — Telephone Encounter (Signed)
 PT called to get clinical but not available - advised would notate and send back. She advised itching/rash last 4-6 days with swelling and increased water  retention leading to discomfort and disorientation. Previously advised by provider to not use Benadryl  due to long term issues, but needs information on possible solutions until injection start on 06/05. Would like call back at [phone]

## 2024-04-10 ENCOUNTER — Encounter: Payer: Self-pay | Admitting: Allergy

## 2024-04-10 ENCOUNTER — Ambulatory Visit: Admitting: Allergy

## 2024-04-10 ENCOUNTER — Other Ambulatory Visit: Payer: Self-pay

## 2024-04-10 VITALS — BP 126/68 | HR 79 | Temp 97.6°F | Resp 18 | Wt 111.1 lb

## 2024-04-10 DIAGNOSIS — J3089 Other allergic rhinitis: Secondary | ICD-10-CM

## 2024-04-10 DIAGNOSIS — L299 Pruritus, unspecified: Secondary | ICD-10-CM

## 2024-04-10 DIAGNOSIS — J4520 Mild intermittent asthma, uncomplicated: Secondary | ICD-10-CM

## 2024-04-10 DIAGNOSIS — R202 Paresthesia of skin: Secondary | ICD-10-CM

## 2024-04-10 DIAGNOSIS — R21 Rash and other nonspecific skin eruption: Secondary | ICD-10-CM | POA: Diagnosis not present

## 2024-04-10 DIAGNOSIS — Z889 Allergy status to unspecified drugs, medicaments and biological substances status: Secondary | ICD-10-CM

## 2024-04-10 DIAGNOSIS — R200 Anesthesia of skin: Secondary | ICD-10-CM

## 2024-04-10 DIAGNOSIS — T781XXD Other adverse food reactions, not elsewhere classified, subsequent encounter: Secondary | ICD-10-CM | POA: Diagnosis not present

## 2024-04-10 DIAGNOSIS — T7840XD Allergy, unspecified, subsequent encounter: Secondary | ICD-10-CM | POA: Diagnosis not present

## 2024-04-10 DIAGNOSIS — B999 Unspecified infectious disease: Secondary | ICD-10-CM

## 2024-04-10 MED ORDER — HYDROXYZINE HCL 10 MG PO TABS: 10.0000 mg | ORAL_TABLET | Freq: Three times a day (TID) | ORAL | 1 refills | Status: AC | PRN

## 2024-04-10 MED ORDER — TRIAMCINOLONE ACETONIDE 0.1 % EX OINT: 1.0000 | TOPICAL_OINTMENT | Freq: Two times a day (BID) | CUTANEOUS | 1 refills | Status: AC | PRN

## 2024-04-10 NOTE — Patient Instructions (Addendum)
 Referral to rheumatology placed - you may need to call around to see who has the soonest appointment.   Follow up with your PCP regarding your water  retention.   Skin/hives/swelling Continue proper skin care. Keep track of outbreaks and take pictures. Increase allegra  up to 2 tablets twice a day as needed.  Continue famotidine  20mg  twice a day.  If symptoms are not controlled or causes drowsiness let us  know. Avoid the following potential triggers: alcohol, tight clothing, NSAIDs, hot showers and getting overheated. May take hydroxyzine  10mg  every 8 hours as needed for itching.  Use triamcinolone  0.1% ointment twice a day as needed for rash flares. Do not use on the face, neck, armpits or groin area. Do not use more than 3 weeks in a row.   If you note increased water  retention with the increased antihistamines then let us  know - we may need to decrease the antihistamines.   Environmental allergies 2024 skin testing positive to mold, cat and dog. Continue environmental control measures as below. Nasal saline spray (i.e., Simply Saline) or nasal saline lavage (i.e., NeilMed) is recommended as needed and prior to medicated nasal sprays. Use cromolyn  4% 1 drop in each eye up to four times a day as needed for itchy/watery eyes.  Wait 10-15 min before putting your contact lens in.  Restart allergy  injections - move to 1 month later.   Asthma During respiratory infections/flares:  Start Symbicort  80mcg 2 puffs twice a day and rinse mouth after each use for 1-2 weeks until your breathing symptoms return to baseline.  Pretreat with albuterol  2 puffs or albuterol  nebulizer.  If you need to use your albuterol  nebulizer machine back to back within 15-30 minutes with no relief then please go to the ER/urgent care for further evaluation.  May use albuterol  rescue inhaler 2 puffs or nebulizer every 4 to 6 hours as needed for shortness of breath, chest tightness, coughing, and wheezing. May use  albuterol  rescue inhaler 2 puffs 5 to 15 minutes prior to strenuous physical activities. Monitor frequency of use - if you need to use it more than twice per week on a consistent basis let us  know.  Breathing control goals:  Full participation in all desired activities (may need albuterol  before activity) Albuterol  use two times or less a week on average (not counting use with activity) Cough interfering with sleep two times or less a month Oral steroids no more than once a year No hospitalizations  Allergic reactions Keep track of symptoms and episodes.  I have prescribed epinephrine  injectable device and demonstrated proper use. For mild symptoms you can take over the counter antihistamines and monitor symptoms closely. Zyrtec  10mg  as needed.   If symptoms worsen or if you have severe symptoms including breathing issues, throat closure, significant swelling, whole body hives, severe diarrhea and vomiting, lightheadedness then use epinephrine  and seek immediate medical care afterwards. Emergency action plan given.  Food Avoid pork.  Multiple drug allergies Avoid drugs on allergy  list.  Recurrent infections Keep track of infections and antibiotics use.  Numbness and tingling Monitor symptoms.  Consider follow up with PCP regarding these symptoms.  [email or web address].[email or web address]  Follow up in 3 months or sooner if needed.    Pet Allergen Avoidance: Contrary to popular opinion, there are no "hypoallergenic" breeds of dogs or cats. That is because people are not allergic to an animal's hair, but to an allergen found in the animal's saliva, dander (dead skin flakes) or urine. Pet allergy  symptoms typically occur within  minutes. For some people, symptoms can build up and become most severe 8 to 12 hours after contact with the animal. People with severe allergies can experience reactions in public places if dander has been transported on the pet owners' clothing. Keeping an animal  outdoors is only a partial solution, since homes with pets in the yard still have higher concentrations of animal allergens. Before getting a pet, ask your allergist to determine if you are allergic to animals. If your pet is already considered part of your family, try to minimize contact and keep the pet out of the bedroom and other rooms where you spend a great deal of time. As with dust mites, vacuum carpets often or replace carpet with a hardwood floor, tile or linoleum. High-efficiency particulate air (HEPA) cleaners can reduce allergen levels over time. While dander and saliva are the source of cat and dog allergens, urine is the source of allergens from rabbits, hamsters, mice and [place] pigs; so ask a non-allergic family member to clean the animal's cage. If you have a pet allergy , talk to your allergist about the potential for allergy  immunotherapy (allergy  shots). This strategy can often provide long-term relief. Mold Control Mold and fungi can grow on a variety of surfaces provided certain temperature and moisture conditions exist.  Outdoor molds grow on plants, decaying vegetation and soil. The major outdoor mold, Alternaria and Cladosporium, are found in very high numbers during hot and dry conditions. Generally, a late summer - fall peak is seen for common outdoor fungal spores. Rain will temporarily lower outdoor mold spore count, but counts rise rapidly when the rainy period ends. The most important indoor molds are Aspergillus and Penicillium. Dark, humid and poorly ventilated basements are ideal sites for mold growth. The next most common sites of mold growth are the bathroom and the kitchen. Outdoor (Seasonal) Mold Control Use air conditioning and keep windows closed. Avoid exposure to decaying vegetation. Avoid leaf raking. Avoid grain handling. Consider wearing a face mask if working in moldy areas.  Indoor (Perennial) Mold Control  Maintain humidity below 50%. Get rid of mold  growth on hard surfaces with water , detergent and, if necessary, 5% bleach (do not mix with other cleaners). Then dry the area completely. If mold covers an area more than 10 square feet, consider hiring an indoor environmental professional. For clothing, washing with soap and water  is best. If moldy items cannot be cleaned and dried, throw them away. Remove sources e.g. contaminated carpets. Repair and seal leaking roofs or pipes. Using dehumidifiers in damp basements may be helpful, but empty the water  and clean units regularly to prevent mildew from forming. All rooms, especially basements, bathrooms and kitchens, require ventilation and cleaning to deter mold and mildew growth. Avoid carpeting on concrete or damp floors, and storing items in damp areas.   Skin care recommendations  Bath time: Always use lukewarm water . AVOID very hot or cold water . Keep bathing time to 5-10 minutes. Do NOT use bubble bath. Use a mild soap and use just enough to wash the dirty areas. Do NOT scrub skin vigorously.  After bathing, pat dry your skin with a towel. Do NOT rub or scrub the skin.  Moisturizers and prescriptions:  ALWAYS apply moisturizers immediately after bathing (within 3 minutes). This helps to lock-in moisture. Use the moisturizer several times a day over the whole body. Good summer moisturizers include: Aveeno, CeraVe, Cetaphil. Good winter moisturizers include: Aquaphor, Vaseline, Cerave, Cetaphil, Eucerin, Vanicream. When using moisturizers along with  medications, the moisturizer should be applied about one hour after applying the medication to prevent diluting effect of the medication or moisturize around where you applied the medications. When not using medications, the moisturizer can be continued twice daily as maintenance.  Laundry and clothing: Avoid laundry products with added color or perfumes. Use unscented hypo-allergenic laundry products such as Tide free, Cheer free & gentle,  and All free and clear.  If the skin still seems dry or sensitive, you can try double-rinsing the clothes. Avoid tight or scratchy clothing such as wool. Do not use fabric softeners or dyer sheets.

## 2024-04-10 NOTE — Telephone Encounter (Signed)
 Patient was scheduled to see Dr. Burdette Carolin today at the [facility] office.

## 2024-04-10 NOTE — Progress Notes (Signed)
 Follow Up Note  RE: Rachel [name] MRN: [id number] DOB: [birth year] Date of Office Visit: 04/10/2024  Referring provider: Joaquin Mulberry, MD Primary care provider: Joaquin Mulberry, MD  Chief Complaint: Rash (Same day visit. Rash and redness on feet x 1 week and sometimes on face blister like and red. On allegra  bid and famotadine once)  History of Present Illness: I had the pleasure of seeing Rachel [name] for a follow up visit at the [facility] on 04/10/2024. She is a 38 y.o. female, who is being followed for: Allergic rhinitis, rash, allergic reaction, adverse food reactions, recurrent infections, asthma, multiple drug allergies. Her previous allergy  office visit was on 03/26/2024 with Dr. Burdette Carolin. Today is a new complaint visit of rash.  Discussed the use of AI scribe software for clinical note transcription with the patient, who gave verbal consent to proceed.    She has been experiencing persistent itching and rash, which have worsened since discontinuing Benadryl . Despite taking famotidine  20 mg twice daily and Allegra  (fexofenadine ) 180 mg twice daily, the symptoms persist. The itching and rash were present even while on Benadryl  but have intensified since stopping it.  She describes episodes of water  retention and swelling, noting that her weight can increase by 5 to 10 pounds within a day, affecting her clothing fit. The swelling is primarily in her thighs, belly, and occasionally her feet. She also experiences rashes in these areas, as well as on her face, which she manages with her skincare routine.  She recalls a similar facial swelling episode in 2019, which lasted a month and resembled a 'half butterfly' pattern. She has a history of being seen by a rheumatologist in the past. She currently does not have a rheumatologist.  She avoids salty foods and processed foods due to their impact on her chronic conditions, and she follows a diet low in salt and  fat.  She has a history of using Singulair (montelukast) and was previously considered allergic to Benadryl , which she outgrew in her late twenties.  She is scheduled to restart allergy  shots next week.  She has an EpiPen  available and has not had to use it recently.   2025 labs: "Blood count, kidney function, liver function, electrolytes, thyroid , inflammation markers, chronic urticaria index (checks for autoantibodies that trigger mast cells), tryptase (checks for mast cell issues), angioedema panel (checks for certain swelling issues) were all normal which is great.    Pork was negative.    Your ANA level was elevated which is a screener for autoimmune issues. I will place a referral to see a rheumatologist for further evaluation. "    Assessment and Plan: [name] is a 38 y.o. female with: Rash and other nonspecific skin eruption Pruritus  Past history - breaking out in random itchy rashes since 2015. No specific triggers noted but has a flare every 1-2 months. Symptoms seems to lasts for a few weeks at a time. History of eczema. No prior dermatology evaluation. 2024 bloodwork (CBC diff, CMP, TSH, ANA, crp, esr, CU, tryptase and alpha-gal) normal.  Interim history - worsening pruritus since weaned off benadryl .  Continue proper skin care. Keep track of outbreaks and take pictures. Increase allegra  up to 2 tablets twice a day as needed.  Continue famotidine  20mg  twice a day.  If symptoms are not controlled or causes drowsiness let us  know. Avoid the following potential triggers: alcohol, tight clothing, NSAIDs, hot showers and getting overheated. May take hydroxyzine  10mg  every 8 hours  as needed for itching.  Use triamcinolone  0.1% ointment twice a day as needed for rash flares. Do not use on the face, neck, armpits or groin area. Do not use more than 3 weeks in a row.  If you note increased water  retention with the increased antihistamines then let us  know - we may need to decrease  the antihistamines.   Other allergic rhinitis Past history - symptoms which flares in the fall and winter. Sinus surgery in the past.  2 dogs and 2 cats at home. 2024 skin prick testing positive to mold, cat and dog only. 2024 bloodwork positive to cat and dog. Started AIT on 01/04/2023 (M-C-D). Flonase  caused epistaxis.  Interim history - planning to restart injections.  Continue environmental control measures as below. Nasal saline spray (i.e., Simply Saline) or nasal saline lavage (i.e., NeilMed) is recommended as needed and prior to medicated nasal sprays. Use cromolyn  4% 1 drop in each eye up to four times a day as needed for itchy/watery eyes.  Wait 10-15 min before putting your contact lens in.  Restart allergy  injections - move to 1 month later.    Allergic reaction Past history - multiple reaction to various medications but then has milder allergic reactions with no known triggers. Interim history - no major reaction requiring epinephrine .  Keep track of symptoms and episodes.  I have prescribed epinephrine  injectable device and demonstrated proper use. For mild symptoms you can take over the counter antihistamines and monitor symptoms closely. Zyrtec  10mg  as needed.   If symptoms worsen or if you have severe symptoms including breathing issues, throat closure, significant swelling, whole body hives, severe diarrhea and vomiting, lightheadedness then use epinephrine  and seek immediate medical care afterwards. Emergency action plan given.   Other adverse food reactions, not elsewhere classified, subsequent encounter Past history - pork causes perioral sores and tingling. Dairy flares her GI symptoms. 2024 skin testing negative to select foods. Avoid pork and dairy. She may have lactose intolerance.    Recurrent infections Past history - sepsis x 2,  pneumonia, ear infections. Usually needs 2-4 courses of antibiotics per year. 2024 normal immunglobulin levels, tetanus, diphtheria and  pneumococcal titers protective.   Keep track of infections and antibiotics use.   Mild intermittent asthma without complication Past history -  usually flares with cold weather and infection. Tried Singulair and Advair in the past.  2024 spirometry was normal. During respiratory infections/flares:  Start Symbicort  80mcg 2 puffs twice a day and rinse mouth after each use for 1-2 weeks until your breathing symptoms return to baseline.  Pretreat with albuterol  2 puffs or albuterol  nebulizer.  If you need to use your albuterol  nebulizer machine back to back within 15-30 minutes with no relief then please go to the ER/urgent care for further evaluation.  May use albuterol  rescue inhaler 2 puffs or nebulizer every 4 to 6 hours as needed for shortness of breath, chest tightness, coughing, and wheezing. May use albuterol  rescue inhaler 2 puffs 5 to 15 minutes prior to strenuous physical activities. Monitor frequency of use - if you need to use it more than twice per week on a consistent basis let us  know.    Multiple drug allergies Past history - reactions to multiple drugs. Orilissa  required ER visit with IM epi. Penicillin reaction as a young child in the form of rash/hives. Avoid drugs on allergy  list.   Numbness and tingling Monitor symptoms.  Consider follow up with PCP regarding these symptoms.  Referral to rheumatology  placed - you may need to call around to see who has the soonest appointment.  Follow up with your PCP regarding your water  retention.  Gave website for GI physician who specializes in functional GI issues with people who have EDS/complicated medical history.   Return in about 3 months (around 07/11/2024).  Meds ordered this encounter  Medications   hydrOXYzine  (ATARAX ) 10 MG tablet    Sig: Take 1 tablet (10 mg total) by mouth 3 (three) times daily as needed for itching.    Dispense:  60 tablet    Refill:  1   triamcinolone  ointment (KENALOG ) 0.1 %    Sig: Apply 1  Application topically 2 (two) times daily as needed (rash flare). Do not use on the face, neck, armpits or groin area. Do not use more than 3 weeks in a row.    Dispense:  80 g    Refill:  1   Lab Orders  No laboratory test(s) ordered today    Diagnostics: None.   Medication List:  Current Outpatient Medications  Medication Sig Dispense Refill   acetaminophen  (TYLENOL ) 500 MG tablet Take 500-1,000 mg by mouth every 6 (six) hours as needed for moderate pain or mild pain.     albuterol  (VENTOLIN  HFA) 108 (90 Base) MCG/ACT inhaler Inhale 2 puffs into the lungs every 4 (four) hours as needed for wheezing or shortness of breath (coughing fits). 18 g 1   atorvastatin  (LIPITOR) 20 MG tablet Take 1 tablet (20 mg total) by mouth daily. 90 tablet 1   budesonide -formoterol  (SYMBICORT ) 80-4.5 MCG/ACT inhaler Inhale 2 puffs into the lungs in the morning and at bedtime. Rinse mouth after each use. Use for 1-2 weeks during asthma flares. 1 each 3   Calcium -Magnesium-Vitamin D  600-40-500 MG-MG-UNIT TB24 Take 1 tablet by mouth every evening.     cromolyn  (OPTICROM ) 4 % ophthalmic solution Place 1 drop into both eyes 4 (four) times daily as needed (itchy/watery eyes). 10 mL 3   EPINEPHRINE  0.3 mg/0.3 mL IJ SOAJ injection INJECT INTO THE MIDDLE OF THE OUTER THIGH AND HOLD FOR 3 SECONDS AS NEEDED FOR SEVERE ALLERGIC REACTION THEN CALL 911 IF USED. 2 each 1   famotidine  (PEPCID ) 20 MG tablet Take 1 tablet (20 mg total) by mouth 2 (two) times daily. 60 tablet 2   ferrous sulfate  (FEROSUL) 325 (65 FE) MG tablet TAKE 1 TABLET BY MOUTH DAILY 90 tablet 1   fexofenadine  (ALLEGRA  ALLERGY ) 180 MG tablet Take 1 tablet (180 mg total) by mouth in the morning and at bedtime. 60 tablet 2   hydrocortisone cream 1 % Apply 1 Application topically 2 (two) times daily.     hydrOXYzine  (ATARAX ) 10 MG tablet Take 1 tablet (10 mg total) by mouth 3 (three) times daily as needed for itching. 60 tablet 1   ibuprofen  (ADVIL ) 200 MG  tablet Take 400-800 mg by mouth every 8 (eight) hours as needed (pain.).     linaclotide  (LINZESS ) 290 MCG CAPS capsule Take 1 capsule (290 mcg total) by mouth daily before breakfast. 30 capsule 3   Melatonin 10 MG TABS Take 10 mg by mouth at bedtime.     MIRALAX  17 GM/SCOOP powder Take 17-34 g by mouth daily. Mix 17 g of powder into water  and drink once a day     PREMARIN  0.625 MG tablet TAKE 1 TABLET BY MOUTH DAILY FOR 21 DAYS. THEN DO NOT TAKE FOR 7 DAYS 30 tablet 11   Prenatal Vit-Fe Fumarate-FA (MULTIVITAMIN-PRENATAL) 27-0.8  MG TABS tablet Take 1 tablet by mouth daily at 12 noon.     promethazine  (PHENERGAN ) 12.5 MG tablet Take 1 tablet (12.5 mg total) by mouth every 8 (eight) hours as needed for nausea. 30 tablet 1   rizatriptan  (MAXALT -MLT) 10 MG disintegrating tablet May repeat in 2 hours if needed 10 tablet 5   sennosides-docusate sodium  (SENOKOT-S) 8.6-50 MG tablet Take 2-3 tablets by mouth daily as needed for constipation.     triamcinolone  ointment (KENALOG ) 0.1 % Apply 1 Application topically 2 (two) times daily as needed (rash flare). Do not use on the face, neck, armpits or groin area. Do not use more than 3 weeks in a row. 80 g 1   No current facility-administered medications for this visit.   Allergies: Allergies  Allergen Reactions   Amitriptyline  Anaphylaxis   Amphetamine-Dextroamphetamine Other (See Comments)    Triggered bipolar, caused depression, and suicidal thoughts   Lupron [Leuprolide] Anaphylaxis   Orilissa  [Elagolix] Anaphylaxis   Penicillins Anaphylaxis, Swelling and Other (See Comments)    Potential for anaphylaxis confirmed. Tolerates Cefphalosporins No "-cillins"!!!! Did it involve swelling of the face/tongue/throat, SOB, or low BP? Yes Did it involve sudden or severe rash/hives, skin peeling, or any reaction on the inside of your mouth or nose? Unknown Did you need to seek medical attention at a hospital or doctor's office? Unknown When did it last happen?  teenager      If all above answers are "NO", may proceed with cephalosporin use.   Wound Dressing Adhesive Rash   Betadine  [Povidone-Iodine ] Swelling and Rash   Chlorhexidine  Hives    DuraPrep    Doxycycline Itching, Nausea And Vomiting and Other (See Comments)    Caused high fever, also   Mixed Ragweed Other (See Comments)    Unknown, Mother and pt state she can't have    Olanzapine Other (See Comments)    Caused mania   Other Other (See Comments)    Stimulants, mood stabilizers, and antidepressants = Triggered bipolar, caused depression, and suicidal thoughts   Prozac [Fluoxetine Hcl] Other (See Comments)    Triggered bipolar, caused depression, and suicidal thoughts   Stevia Glycerite Extract [Flavoring Agent (Non-Screening)] Other (See Comments)    Unknown reaction; mother and pt confirm any Artificial Sweeteners cause reaction   Tape Other (See Comments)    Paper tape is tolerated   Triple Antibiotic Pain Relief [Neomy-Bacit-Polymyx-Pramoxine] Hives   Reglan  [Metoclopramide ] Anxiety and Other (See Comments)    Triggered bipolar, caused depression, suicidal thoughts, muscle twitching, stiffness, and high anxiety    I reviewed her past medical history, social history, family history, and environmental history and no significant changes have been reported from her previous visit.  Review of Systems  Constitutional:  Negative for appetite change, chills, fever and unexpected weight change.  HENT:  Negative for congestion and rhinorrhea.   Eyes:  Negative for itching.  Respiratory:  Negative for cough, chest tightness, shortness of breath and wheezing.   Cardiovascular:  Negative for chest pain.  Genitourinary:  Negative for difficulty urinating.  Skin:  Positive for rash.  Allergic/Immunologic: Positive for environmental allergies.    Objective: BP 126/68   Pulse 79   Temp 97.6 F (36.4 C) (Temporal)   Resp 18   Wt 111 lb 1.9 oz (50.4 kg)   LMP  (LMP Unknown)   SpO2 99%    BMI 20.32 kg/m  Body mass index is 20.32 kg/m. Physical Exam Vitals and nursing note reviewed.  Constitutional:  Appearance: Normal appearance. She is well-developed.  HENT:     Head: Normocephalic and atraumatic.     Right Ear: Tympanic membrane and external ear normal.     Left Ear: Tympanic membrane and external ear normal.     Nose: Nose normal.     Mouth/Throat:     Mouth: Mucous membranes are moist.     Pharynx: Oropharynx is clear.  Eyes:     Conjunctiva/sclera: Conjunctivae normal.  Cardiovascular:     Rate and Rhythm: Normal rate and regular rhythm.     Heart sounds: Normal heart sounds. No murmur heard.    No friction rub. No gallop.  Pulmonary:     Effort: Pulmonary effort is normal.     Breath sounds: Normal breath sounds. No wheezing, rhonchi or rales.  Musculoskeletal:     Cervical back: Neck supple.  Skin:    General: Skin is warm.     Findings: Rash present.     Comments: Blanchable circular erythematous rash on right dorsal foot.   Neurological:     Mental Status: She is alert and oriented to person, place, and time.  Psychiatric:        Behavior: Behavior normal.   Previous notes and tests were reviewed. The plan was reviewed with the patient/family, and all questions/concerned were addressed.  It was my pleasure to see Jaleya today and participate in her care. Please feel free to contact me with any questions or concerns.  Sincerely,  Eudelia Hero, DO Allergy  & Immunology  [facility]  [facility] office: [phone] [facility] office: [phone]

## 2024-04-15 ENCOUNTER — Other Ambulatory Visit: Payer: Self-pay | Admitting: Gastroenterology

## 2024-04-17 ENCOUNTER — Ambulatory Visit

## 2024-04-17 ENCOUNTER — Ambulatory Visit: Admitting: Clinical

## 2024-04-17 DIAGNOSIS — F909 Attention-deficit hyperactivity disorder, unspecified type: Secondary | ICD-10-CM

## 2024-04-17 DIAGNOSIS — F4310 Post-traumatic stress disorder, unspecified: Secondary | ICD-10-CM

## 2024-04-17 DIAGNOSIS — Z658 Other specified problems related to psychosocial circumstances: Secondary | ICD-10-CM

## 2024-04-17 NOTE — BH Specialist Note (Unsigned)
 Integrated Behavioral Health via Telemedicine Visit  04/18/2024 [facility] [id number]  Number of Integrated Behavioral Health Clinician visits: Additional Visit  Session Start time: 1042   Session End time: 1208  Total time in minutes: 86  Referring Provider: Avie Boeck, MD Patient/Family location: Home [facility] Provider location: Center for [facility]  All persons participating in visit: Patient Margot Oriordan and [facility] Albino Bufford   Types of Service: Individual psychotherapy and Video visit  I connected with Mariel Shope and/or Marcia Setters Ardila's n/a via  Telephone or Video Enabled Telemedicine Application  (Video is Caregility application) and verified that I am speaking with the correct person using two identifiers. Discussed confidentiality: Yes   I discussed the limitations of telemedicine and the availability of in person appointments.  Discussed there is a possibility of technology failure and discussed alternative modes of communication if that failure occurs.  I discussed that engaging in this telemedicine visit, they consent to the provision of behavioral healthcare and the services will be billed under their insurance.  Patient and/or legal guardian expressed understanding and consented to Telemedicine visit: Yes   Presenting Concerns: Patient and/or family reports the following symptoms/concerns: Pt is experiencing increased anxiety regarding health symptoms that have not been taken seriously by specialists (gaining 8-10 pounds overnight at times, butterfly rash across her face from 2019, itching, hives, swelling, etc.); suspects that it doesn't appear she is "swollen" due to her normally overly thin size, so that, to providers who only see her swollen, think she looks "normal".   Duration of problem: Ongoing; Severity of problem: moderate  Patient and/or Family's Strengths/Protective  Factors: Social connections, Concrete supports in place (healthy food, safe environments, etc.), and Sense of purpose  Goals Addressed: Patient will:  Reduce symptoms of: anxiety    Demonstrate ability to: Increase healthy adjustment to current life circumstances  Progress towards Goals: Ongoing    Interventions: Interventions utilized:  Motivational Interviewing and Supportive Reflection Standardized Assessments completed: Not Needed    Patient and/or Family Response: Patient agrees with treatment plan.   Clinical Assessment/Diagnosis  Post traumatic stress disorder (PTSD)  Attention deficit hyperactivity disorder (ADHD), unspecified ADHD type  Psychosocial stressors   Patient may benefit from continued therapeutic intervention  .  Plan: Follow up with behavioral health clinician on : Three weeks Behavioral recommendations:  -Continue using daily self-coping strategies; prioritizing healthy self-care (with emphasis on spending time with supportive people in life) -Continue with rheumatology referral suggestion from medical provider; continue advocating for your own healthcare, as discussed -Consider blocking specific ex from all social media, for greater peace of mind and ability to move forward in life Referral(s): Integrated [employer] (In Clinic)  I discussed the assessment and treatment plan with the patient and/or parent/guardian. They were provided an opportunity to ask questions and all were answered. They agreed with the plan and demonstrated an understanding of the instructions.   They were advised to call back or seek an in-person evaluation if the symptoms worsen or if the condition fails to improve as anticipated.  Teegan Guinther C Aniyha Tate, LCSW

## 2024-05-02 ENCOUNTER — Ambulatory Visit (HOSPITAL_BASED_OUTPATIENT_CLINIC_OR_DEPARTMENT_OTHER): Admitting: Cardiology

## 2024-05-02 ENCOUNTER — Encounter (HOSPITAL_BASED_OUTPATIENT_CLINIC_OR_DEPARTMENT_OTHER): Payer: Self-pay | Admitting: Cardiology

## 2024-05-02 VITALS — BP 108/72 | HR 67 | Ht 62.0 in | Wt 110.4 lb

## 2024-05-02 DIAGNOSIS — R55 Syncope and collapse: Secondary | ICD-10-CM | POA: Diagnosis not present

## 2024-05-02 DIAGNOSIS — G901 Familial dysautonomia [Riley-Day]: Secondary | ICD-10-CM | POA: Diagnosis not present

## 2024-05-02 NOTE — Patient Instructions (Signed)
 Medication Instructions:  The current medical regimen is effective;  continue present plan and medications.  *If you need a refill on your cardiac medications before your next appointment, please call your pharmacy*  Testing/Procedures: Your physician has requested that you have an echocardiogram. Echocardiography is a painless test that uses sound waves to create images of your heart. It provides your doctor with information about the size and shape of your heart and how well your heart's chambers and valves are working. This procedure takes approximately one hour. There are no restrictions for this procedure. Please do NOT wear cologne, perfume, aftershave, or lotions (deodorant is allowed). Please arrive 15 minutes prior to your appointment time.  Please note: We ask at that you not bring children with you during ultrasound (echo/ vascular) testing. Due to room size and safety concerns, children are not allowed in the ultrasound rooms during exams. Our front office staff cannot provide observation of children in our lobby area while testing is being conducted. An adult accompanying a patient to their appointment will only be allowed in the ultrasound room at the discretion of the ultrasound technician under special circumstances. We apologize for any inconvenience.  Follow-Up: At [facility], you and your health needs are our priority.  As part of our continuing mission to provide you with exceptional heart care, our providers are all part of one team.  This team includes your primary Cardiologist (physician) and Advanced Practice Providers or APPs (Physician Assistants and Nurse Practitioners) who all work together to provide you with the care you need, when you need it.  Your next appointment:   Follow up as needed with Dr Renna Cary  We recommend signing up for the patient portal called "MyChart".  Sign up information is provided on this After Visit Summary.  MyChart is used to connect  with patients for Virtual Visits (Telemedicine).  Patients are able to view lab/test results, encounter notes, upcoming appointments, etc.  Non-urgent messages can be sent to your provider as well.   To learn more about what you can do with MyChart, go to [email or web address].

## 2024-05-05 ENCOUNTER — Ambulatory Visit: Admitting: Clinical

## 2024-05-05 DIAGNOSIS — F4310 Post-traumatic stress disorder, unspecified: Secondary | ICD-10-CM | POA: Diagnosis not present

## 2024-05-05 DIAGNOSIS — Z658 Other specified problems related to psychosocial circumstances: Secondary | ICD-10-CM

## 2024-05-05 DIAGNOSIS — F909 Attention-deficit hyperactivity disorder, unspecified type: Secondary | ICD-10-CM

## 2024-05-05 NOTE — BH Specialist Note (Signed)
 Integrated Behavioral Health via Telemedicine Visit  05/05/2024 Kseniya [name] [id number]  Number of Integrated Behavioral Health Clinician visits: Additional Visit  Session Start time: 1047   Session End time: 1137  Total time in minutes: 50    Referring Provider: Jerilynn Buddle, MD Patient/Family location: Home [facility] Provider location: Center for [facility]  All persons participating in visit: [name] and [facility] Zafirah Vanzee   Types of Service: Individual psychotherapy and Telephone visit  I connected with Rachel [name] [name]'s n/a via  Telephone or Video Enabled Telemedicine Application  (Video is Caregility application) and verified that I am speaking with the correct person using two identifiers. Discussed confidentiality: Yes   I discussed the limitations of telemedicine and the availability of in person appointments.  Discussed there is a possibility of technology failure and discussed alternative modes of communication if that failure occurs.  I discussed that engaging in this telemedicine visit, they consent to the provision of behavioral healthcare and the services will be billed under their insurance.  Patient and/or legal guardian expressed understanding and consented to Telemedicine visit: Yes   Presenting Concerns: Patient and/or family reports the following symptoms/concerns: Increased anxiety in ongoing conflict with specific people.  Duration of problem: Ongoing; Severity of problem: moderate  Patient and/or Family's Strengths/Protective Factors: Social connections, Social and Emotional competence, Concrete supports in place (healthy food, safe environments, etc.), and Sense of purpose  Goals Addressed: Patient will:  Reduce symptoms of: anxiety, depression, and stress    Demonstrate ability to: Increase healthy adjustment to current life  circumstances  Progress towards Goals: Ongoing  Interventions: Interventions utilized:  Manufacturing systems engineer and Supportive Reflection Standardized Assessments completed: Not Needed  Patient and/or Family Response: Patient agrees with treatment plan.  Clinical Assessment/Diagnosis  Post traumatic stress disorder (PTSD)  Attention deficit hyperactivity disorder (ADHD), unspecified ADHD type  Psychosocial stressors .   Patient may benefit from continued therapeutic intervention.  Plan: Follow up with behavioral health clinician on : Two weeks Behavioral recommendations:  -Continue daily self-coping strategies as needed (with priority on spending time with supportive people) -Consider not engaging in conversation with specific people, as discussed (reminder that you don't need to explain and/or justify your words and actions with people who are verbally abusive/manipulative) -Continue plan to enjoy upcoming [place] festival and hike with like-minded friend(s) Referral(s): Integrated [employer] (In Clinic)  I discussed the assessment and treatment plan with the patient and/or parent/guardian. They were provided an opportunity to ask questions and all were answered. They agreed with the plan and demonstrated an understanding of the instructions.   They were advised to call back or seek an in-person evaluation if the symptoms worsen or if the condition fails to improve as anticipated.  Rachel JAYSON Mering, LCSW     01/23/2024    9:08 AM 04/23/2023    8:50 AM 11/02/2022    9:23 AM 08/02/2022    2:20 PM 08/02/2022    2:11 PM  Depression screen PHQ 2/9  Decreased Interest 2 0 0 0 0  Down, Depressed, Hopeless 2 1 1  0 0  PHQ - 2 Score 4 1 1  0 0  Altered sleeping 3 2 1 2    Tired, decreased energy 3 2 1 2    Change in appetite 3 2 1 1    Feeling bad or failure about yourself  0 0 0 0   Trouble concentrating 2 1 0 1  Moving slowly or fidgety/restless 0 0 0 0   Suicidal  thoughts 0 0 0 0   PHQ-9 Score 15 8 4 6        01/23/2024    9:10 AM 04/23/2023    8:51 AM 11/02/2022    9:24 AM 08/02/2022    2:20 PM  GAD 7 : Generalized Anxiety Score  Nervous, Anxious, on Edge 2 2 1  0  Control/stop worrying 2 1 0 0  Worry too much - different things 2 0 0 0  Trouble relaxing 0 0 1 1  Restless 0 0 0 0  Easily annoyed or irritable 0 0 0 0  Afraid - awful might happen 3 0 0 0  Total GAD 7 Score 9 3 2  1

## 2024-05-05 NOTE — Telephone Encounter (Signed)
 Rachel [name] has been scheduled with Rheumatology.  She is scheduled for 11/17/24 at 9:00 am with Dr. Dolphus.

## 2024-05-06 ENCOUNTER — Encounter: Payer: Self-pay | Admitting: Allergy

## 2024-05-06 ENCOUNTER — Other Ambulatory Visit: Payer: Self-pay

## 2024-05-06 ENCOUNTER — Ambulatory Visit: Admitting: Allergy

## 2024-05-06 VITALS — BP 98/80 | HR 68 | Temp 98.1°F | Resp 18 | Ht 62.99 in | Wt 113.0 lb

## 2024-05-06 DIAGNOSIS — J3089 Other allergic rhinitis: Secondary | ICD-10-CM

## 2024-05-06 DIAGNOSIS — J4520 Mild intermittent asthma, uncomplicated: Secondary | ICD-10-CM

## 2024-05-06 DIAGNOSIS — T7840XD Allergy, unspecified, subsequent encounter: Secondary | ICD-10-CM | POA: Diagnosis not present

## 2024-05-06 DIAGNOSIS — L299 Pruritus, unspecified: Secondary | ICD-10-CM

## 2024-05-06 DIAGNOSIS — R200 Anesthesia of skin: Secondary | ICD-10-CM | POA: Diagnosis not present

## 2024-05-06 DIAGNOSIS — R21 Rash and other nonspecific skin eruption: Secondary | ICD-10-CM | POA: Diagnosis not present

## 2024-05-06 DIAGNOSIS — R202 Paresthesia of skin: Secondary | ICD-10-CM

## 2024-05-06 DIAGNOSIS — B999 Unspecified infectious disease: Secondary | ICD-10-CM | POA: Diagnosis not present

## 2024-05-06 DIAGNOSIS — Z889 Allergy status to unspecified drugs, medicaments and biological substances status: Secondary | ICD-10-CM

## 2024-05-06 DIAGNOSIS — T781XXD Other adverse food reactions, not elsewhere classified, subsequent encounter: Secondary | ICD-10-CM | POA: Diagnosis not present

## 2024-05-06 DIAGNOSIS — Z91038 Other insect allergy status: Secondary | ICD-10-CM | POA: Diagnosis not present

## 2024-05-06 MED ORDER — FEXOFENADINE HCL 180 MG PO TABS: 360.0000 mg | ORAL_TABLET | Freq: Two times a day (BID) | ORAL | 2 refills | Status: DC

## 2024-05-06 NOTE — Patient Instructions (Addendum)
 Yellow jacket stings Get bloodwork We are ordering labs, so please allow 1-2 weeks for the results to come back. With the newly implemented Cures Act, the labs might be visible to you at the same time that they become visible to me. However, I will not address the results until all of the results are back, so please be patient.  In the meantime, continue recommendations in your patient instructions, including avoidance measures (if applicable), until you hear from me.  Skin/hives/swelling Continue proper skin care. Keep track of outbreaks and take pictures. Continue allegra  2 tablets twice a day as needed.  Continue famotidine  20mg  twice a day.  If symptoms are not controlled or causes drowsiness let us  know. Avoid the following potential triggers: alcohol, tight clothing, NSAIDs, hot showers and getting overheated. May take hydroxyzine  10mg  every 8 hours as needed for itching.  Use triamcinolone  0.1% ointment twice a day as needed for rash flares. Do not use on the face, neck, armpits or groin area. Do not use more than 3 weeks in a row.   Environmental allergies 2024 skin testing positive to mold, cat and dog. Continue environmental control measures as below. Nasal saline spray (i.e., Simply Saline) or nasal saline lavage (i.e., NeilMed) is recommended as needed and prior to medicated nasal sprays. Use cromolyn  4% 1 drop in each eye up to four times a day as needed for itchy/watery eyes.  Wait 10-15 min before putting your contact lens in.  Move out allergy  injection until cardiology work up is completed.   Asthma During respiratory infections/flares:  Start Symbicort  80mcg 2 puffs twice a day and rinse mouth after each use for 1-2 weeks until your breathing symptoms return to baseline.  Pretreat with albuterol  2 puffs or albuterol  nebulizer.  If you need to use your albuterol  nebulizer machine back to back within 15-30 minutes with no relief then please go to the ER/urgent care for  further evaluation.  May use albuterol  rescue inhaler 2 puffs or nebulizer every 4 to 6 hours as needed for shortness of breath, chest tightness, coughing, and wheezing. May use albuterol  rescue inhaler 2 puffs 5 to 15 minutes prior to strenuous physical activities. Monitor frequency of use - if you need to use it more than twice per week on a consistent basis let us  know.  Breathing control goals:  Full participation in all desired activities (may need albuterol  before activity) Albuterol  use two times or less a week on average (not counting use with activity) Cough interfering with sleep two times or less a month Oral steroids no more than once a year No hospitalizations  Allergic reactions Keep track of symptoms and episodes.  For mild symptoms you can take over the counter antihistamines (zyrtec  10mg  to 20mg ) and monitor symptoms closely.  If symptoms worsen or if you have severe symptoms including breathing issues, throat closure, significant swelling, whole body hives, severe diarrhea and vomiting, lightheadedness then use epinephrine  and seek immediate medical care afterwards. Emergency action plan in place.   Food Avoid pork.  Multiple drug allergies Avoid drugs on allergy  list.  Recurrent infections Keep track of infections and antibiotics use.  Numbness and tingling and dizziness  Monitor symptoms.  Consider follow up with PCP regarding these symptoms.  Follow up in 2 months or sooner if needed.   Keep appointment with rheumatology.  Keep appointment with cardiology.

## 2024-05-06 NOTE — Progress Notes (Signed)
 Follow Up Note  RE: Rachel [name] MRN: [id number] DOB: [birth year] Date of Office Visit: 05/06/2024  Referring provider: Delbert Clam, MD Primary care provider: Delbert Clam, MD  Chief Complaint: Allergic Rhinitis  (Pt reports getting stung by a yellow jacket on right leg 2 days ago. Pt also reports swelling and itchiness of the left second toe. Hives on both hands. Reports dizziness, light headiness and hoarseness )  History of Present Illness: I had the pleasure of seeing Rachel [name] for a follow up visit at the [facility] on 05/06/2024. She is a 38 y.o. female, who is being followed for allergic rhinitis, rash, allergic reaction, adverse food reactions, recurrent infections, asthma, multiple drug allergies. Her previous allergy  office visit was on 04/10/2024 with Dr. Luke. Today is a new complaint visit of reaction to yellow jacket.  Discussed the use of AI scribe software for clinical note transcription with the patient, who gave verbal consent to proceed.    Rachel [name] is a 38 year old female with Ehlers-Danlos syndrome and dysautonomia who presents with a reaction to a yellow jacket sting.  She was stung by a yellow jacket on her right leg on Saturday, resulting in generalized itching, rash on her fingers, and swelling and redness in her left second toe, which was not stung. She also experiences a sensation of dizziness, lightheadedness, exhaustion, and a crawling sensation throughout her body which has not gotten worse. She does have a localized reaction.   She has a history of Ehlers-Danlos syndrome and was recently diagnosed with dysautonomia by a cardiologist. She is awaiting an echocardiogram in July to further assess her condition. She has not used her EpiPen  but carries it with her. Her current medications include famotidine  twice a day, Allegra  two tablets twice a day, and hydroxyzine  every eight hours as needed for  itching. She also uses triamcinolone  topical cream for her symptoms.  In the past, she was stung by a yellow jacket once during elementary school but does not recall the severity of the reaction.     Assessment and Plan: Rachel [name] is a 38 y.o. female with: Hymenoptera allergy  Localized reaction and unclear if her other symptoms are due to the insect sting vs her underlying conditions.  Continue to avoid. Gave handout. Get bloodwork.  Has epinephrine  device on hand if needed.   Rash and other nonspecific skin eruption Pruritus  Past history - breaking out in random itchy rashes since 2015. No specific triggers noted but has a flare every 1-2 months. Symptoms seems to lasts for a few weeks at a time. History of eczema. No prior dermatology evaluation. 2024 bloodwork (CBC diff, CMP, TSH, ANA, crp, esr, CU, tryptase and alpha-gal) normal.  Continue proper skin care. Keep track of outbreaks and take pictures. Continue allegra  2 tablets twice a day as needed.  Continue famotidine  20mg  twice a day.  If symptoms are not controlled or causes drowsiness let us  know. Avoid the following potential triggers: alcohol, tight clothing, NSAIDs, hot showers and getting overheated. May take hydroxyzine  10mg  every 8 hours as needed for itching.  Use triamcinolone  0.1% ointment twice a day as needed for rash flares. Do not use on the face, neck, armpits or groin area. Do not use more than 3 weeks in a row.  Patient asking about urine test to rule out mcas - will consider ordering at next visit.   Other allergic rhinitis Past history - symptoms which flares in the fall  and winter. Sinus surgery in the past.  2 dogs and 2 cats at home. 2024 skin prick testing positive to mold, cat and dog only. 2024 bloodwork positive to cat and dog. Started AIT on 01/04/2023 (M-C-D). Flonase  caused epistaxis.  Interim history - planning to restart injections.  Continue environmental control measures as below. Nasal saline  spray (i.e., Simply Saline) or nasal saline lavage (i.e., NeilMed) is recommended as needed and prior to medicated nasal sprays. Use cromolyn  4% 1 drop in each eye up to four times a day as needed for itchy/watery eyes.  Wait 10-15 min before putting your contact lens in.  Hold allergy  injection until cardiology work up is completed.    Allergic reaction Past history - multiple reaction to various medications but then has milder allergic reactions with no known triggers. Keep track of symptoms and episodes.  For mild symptoms you can take over the counter antihistamines (zyrtec  10mg  to 20mg ) and monitor symptoms closely.  If symptoms worsen or if you have severe symptoms including breathing issues, throat closure, significant swelling, whole body hives, severe diarrhea and vomiting, lightheadedness then use epinephrine  and seek immediate medical care afterwards. Emergency action plan in place.    Other adverse food reactions, not elsewhere classified, subsequent encounter Past history - pork causes perioral sores and tingling. Dairy flares her GI symptoms. 2024 skin testing negative to select foods. Avoid pork and dairy. She may have lactose intolerance.    Recurrent infections Past history - sepsis x 2,  pneumonia, ear infections. Usually needs 2-4 courses of antibiotics per year. 2024 normal immunglobulin levels, tetanus, diphtheria and pneumococcal titers protective.   Keep track of infections and antibiotics use.   Mild intermittent asthma without complication Past history -  usually flares with cold weather and infection. Tried Singulair and Advair in the past.  2024 spirometry was normal. During respiratory infections/flares:  Start Symbicort  80mcg 2 puffs twice a day and rinse mouth after each use for 1-2 weeks until your breathing symptoms return to baseline.  Pretreat with albuterol  2 puffs or albuterol  nebulizer.  If you need to use your albuterol  nebulizer machine back to back  within 15-30 minutes with no relief then please go to the ER/urgent care for further evaluation.  May use albuterol  rescue inhaler 2 puffs or nebulizer every 4 to 6 hours as needed for shortness of breath, chest tightness, coughing, and wheezing. May use albuterol  rescue inhaler 2 puffs 5 to 15 minutes prior to strenuous physical activities. Monitor frequency of use - if you need to use it more than twice per week on a consistent basis let us  know.    Multiple drug allergies Past history - reactions to multiple drugs. Orilissa  required ER visit with IM epi. Penicillin reaction as a young child in the form of rash/hives. Avoid drugs on allergy  list.   Numbness and tingling and dizziness  Monitor symptoms.  Consider follow up with PCP regarding these symptoms.   Keep appointment with rheumatology.  Keep appointment with cardiology.  Return in about 2 months (around 07/06/2024).  Meds ordered this encounter  Medications   fexofenadine  (ALLEGRA  ALLERGY ) 180 MG tablet    Sig: Take 2 tablets (360 mg total) by mouth in the morning and at bedtime.    Dispense:  120 tablet    Refill:  2   Lab Orders         Hymenoptera Venom Allergy  II         Allergen Fire [employer]  Diagnostics: None.   Medication List:  Current Outpatient Medications  Medication Sig Dispense Refill   acetaminophen  (TYLENOL ) 500 MG tablet Take 500-1,000 mg by mouth every 6 (six) hours as needed for moderate pain or mild pain.     albuterol  (VENTOLIN  HFA) 108 (90 Base) MCG/ACT inhaler Inhale 2 puffs into the lungs every 4 (four) hours as needed for wheezing or shortness of breath (coughing fits). 18 g 1   atorvastatin  (LIPITOR) 20 MG tablet Take 1 tablet (20 mg total) by mouth daily. 90 tablet 1   budesonide -formoterol  (SYMBICORT ) 80-4.5 MCG/ACT inhaler Inhale 2 puffs into the lungs in the morning and at bedtime. Rinse mouth after each use. Use for 1-2 weeks during asthma flares. 1 each 3   Calcium -Magnesium-Vitamin D   600-40-500 MG-MG-UNIT TB24 Take 1 tablet by mouth every evening.     cromolyn  (OPTICROM ) 4 % ophthalmic solution Place 1 drop into both eyes 4 (four) times daily as needed (itchy/watery eyes). 10 mL 3   EPINEPHRINE  0.3 mg/0.3 mL IJ SOAJ injection INJECT INTO THE MIDDLE OF THE OUTER THIGH AND HOLD FOR 3 SECONDS AS NEEDED FOR SEVERE ALLERGIC REACTION THEN CALL 911 IF USED. 2 each 1   famotidine  (PEPCID ) 20 MG tablet Take 1 tablet (20 mg total) by mouth 2 (two) times daily. 60 tablet 2   ferrous sulfate  (FEROSUL) 325 (65 FE) MG tablet TAKE 1 TABLET BY MOUTH DAILY 90 tablet 1   hydrocortisone cream 1 % Apply 1 Application topically 2 (two) times daily.     hydrOXYzine  (ATARAX ) 10 MG tablet Take 1 tablet (10 mg total) by mouth 3 (three) times daily as needed for itching. 60 tablet 1   ibuprofen  (ADVIL ) 200 MG tablet Take 400-800 mg by mouth every 8 (eight) hours as needed (pain.).     LINZESS  290 MCG CAPS capsule TAKE 1 CAPSULE BY MOUTH DAILY BEFORE BREAKFAST 30 capsule 3   Melatonin 10 MG TABS Take 10 mg by mouth at bedtime.     MIRALAX  17 GM/SCOOP powder Take 17-34 g by mouth daily. Mix 17 g of powder into water  and drink once a day     PREMARIN  0.625 MG tablet TAKE 1 TABLET BY MOUTH DAILY FOR 21 DAYS. THEN DO NOT TAKE FOR 7 DAYS 30 tablet 11   Prenatal Vit-Fe Fumarate-FA (MULTIVITAMIN-PRENATAL) 27-0.8 MG TABS tablet Take 1 tablet by mouth daily at 12 noon.     promethazine  (PHENERGAN ) 12.5 MG tablet Take 1 tablet (12.5 mg total) by mouth every 8 (eight) hours as needed for nausea. 30 tablet 1   rizatriptan  (MAXALT -MLT) 10 MG disintegrating tablet May repeat in 2 hours if needed 10 tablet 5   sennosides-docusate sodium  (SENOKOT-S) 8.6-50 MG tablet Take 2-3 tablets by mouth daily as needed for constipation.     triamcinolone  ointment (KENALOG ) 0.1 % Apply 1 Application topically 2 (two) times daily as needed (rash flare). Do not use on the face, neck, armpits or groin area. Do not use more than 3 weeks  in a row. 80 g 1   fexofenadine  (ALLEGRA  ALLERGY ) 180 MG tablet Take 2 tablets (360 mg total) by mouth in the morning and at bedtime. 120 tablet 2   No current facility-administered medications for this visit.   Allergies: Allergies  Allergen Reactions   Amitriptyline  Anaphylaxis   Amphetamine-Dextroamphetamine Other (See Comments)    Triggered bipolar, caused depression, and suicidal thoughts   Lupron [Leuprolide] Anaphylaxis   Orilissa  [Elagolix] Anaphylaxis   Penicillins Anaphylaxis, Swelling and Other (  See Comments)    Potential for anaphylaxis confirmed. Tolerates Cefphalosporins No -cillins!!!! Did it involve swelling of the face/tongue/throat, SOB, or low BP? Yes Did it involve sudden or severe rash/hives, skin peeling, or any reaction on the inside of your mouth or nose? Unknown Did you need to seek medical attention at a hospital or doctor's office? Unknown When did it last happen? teenager      If all above answers are NO, may proceed with cephalosporin use.   Wound Dressing Adhesive Rash   Betadine  [Povidone-Iodine ] Swelling and Rash   Chlorhexidine  Hives    DuraPrep    Doxycycline Itching, Nausea And Vomiting and Other (See Comments)    Caused high fever, also   Mixed Ragweed Other (See Comments)    Unknown, Mother and pt state she can't have    Olanzapine Other (See Comments)    Caused mania   Other Other (See Comments)    Stimulants, mood stabilizers, and antidepressants = Triggered bipolar, caused depression, and suicidal thoughts   Prozac [Fluoxetine Hcl] Other (See Comments)    Triggered bipolar, caused depression, and suicidal thoughts   Stevia Glycerite Extract [Flavoring Agent (Non-Screening)] Other (See Comments)    Unknown reaction; mother and pt confirm any Artificial Sweeteners cause reaction   Tape Other (See Comments)    Paper tape is tolerated   Triple Antibiotic Pain Relief [Neomy-Bacit-Polymyx-Pramoxine] Hives   Reglan  [Metoclopramide ] Anxiety  and Other (See Comments)    Triggered bipolar, caused depression, suicidal thoughts, muscle twitching, stiffness, and high anxiety    I reviewed her past medical history, social history, family history, and environmental history and no significant changes have been reported from her previous visit.  Review of Systems  Constitutional:  Negative for appetite change, chills, fever and unexpected weight change.  HENT:  Negative for congestion and rhinorrhea.   Eyes:  Negative for itching.  Respiratory:  Negative for cough, chest tightness, shortness of breath and wheezing.   Cardiovascular:  Negative for chest pain.  Genitourinary:  Negative for difficulty urinating.  Skin:  Positive for rash.  Allergic/Immunologic: Positive for environmental allergies.  Neurological:  Positive for dizziness and light-headedness.    Objective: BP 98/80 (BP Location: Left Arm, Patient Position: Sitting, Cuff Size: Normal)   Pulse 68   Temp 98.1 F (36.7 C) (Temporal)   Resp 18   Ht 5' 2.99 (1.6 m)   Wt 113 lb (51.3 kg)   LMP  (LMP Unknown)   SpO2 98%   BMI 20.02 kg/m  Body mass index is 20.02 kg/m. Physical Exam Vitals and nursing note reviewed.  Constitutional:      Appearance: Normal appearance. She is well-developed.  HENT:     Head: Normocephalic and atraumatic.     Right Ear: Tympanic membrane and external ear normal.     Left Ear: Tympanic membrane and external ear normal.     Nose: Nose normal.     Mouth/Throat:     Mouth: Mucous membranes are moist.     Pharynx: Oropharynx is clear.   Eyes:     Conjunctiva/sclera: Conjunctivae normal.    Cardiovascular:     Rate and Rhythm: Normal rate and regular rhythm.     Heart sounds: Normal heart sounds. No murmur heard.    No friction rub. No gallop.  Pulmonary:     Effort: Pulmonary effort is normal.     Breath sounds: Normal breath sounds. No wheezing, rhonchi or rales.   Musculoskeletal:     Cervical  back: Neck supple.    Skin:    General: Skin is warm.     Findings: Rash present.     Comments: Rash with petechial spots on right lateral ankle area.    Neurological:     Mental Status: She is alert and oriented to person, place, and time.   Psychiatric:        Behavior: Behavior normal.    Previous notes and tests were reviewed. The plan was reviewed with the patient/family, and all questions/concerned were addressed.  It was my pleasure to see Monet today and participate in her care. Please feel free to contact me with any questions or concerns.  Sincerely,  Orlan Cramp, DO Allergy  & Immunology  [facility]  [place] office: [phone] [facility] office: [phone]

## 2024-05-06 NOTE — Progress Notes (Signed)
 Cardiology Office Note:  .   Date:  05/06/2024  ID:  Rachel [name], DOB 1986-02-27, MRN [id number] PCP: Delbert Clam, MD  [facility] Providers Cardiologist:  Stanly DELENA Leavens, MD    History of Present Illness: .   Rachel [name] is a 38 y.o. female Discussed the use of AI scribe software for clinical note transcription with the patient, who gave verbal consent to proceed.  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 She is accompanied by her mother, [name], who we work with on CV service line.  She experiences episodes of syncope and tachycardia, with symptoms including 'vision going dark,' a sensation of the room spinning, and vertigo. During these episodes, her heart rate and blood pressure tend to drop. She has been hospitalized and seen in the ER for these symptoms, but no definitive treatment has been provided.  In 2021, she was evaluated by cardiology for similar symptoms. An echocardiogram at that time was normal, and a prior EKG showed diffuse J point elevation. She wore a heart monitor, which did not reveal any dangerous arrhythmias, though her heart rate reached 160 bpm with some PVC/PAC's.  She has a history of Ehlers-Danlos syndrome and reports symptoms consistent with dysautonomia, including water  retention, swelling, and rashes. She recently had a positive ANA titer with a speckled pattern, similar to findings in 2019, and is seeking further evaluation by rheumatology.  She experiences swelling primarily in her abdomen and thighs, with pitting edema noted (? Low albumin). 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 Her weight has improved since the surgery, and she is currently able to maintain her nutritional intake better.  Her current management  includes hydration, with an emphasis on water  intake and vitamin C supplementation. She is cautious with salt intake due to its impact on her GI symptoms and swelling. She incorporates protein into her diet through chicken, eggs, seafood, and low-fat beef, and uses collagen and protein powders to supplement her intake.  She engages in regular physical activity, including recumbent biking and floor exercises, to manage her symptoms and maintain strength. She is mindful of her body's signals and adjusts her activity level accordingly.      Studies Reviewed: SABRA   EKG Interpretation Date/Time:  Friday May 02 2024 13:42:05 EDT Ventricular Rate:  65 PR Interval:  176 QRS Duration:  84 QT Interval:  392 QTC Calculation: 407 R Axis:   72  Text Interpretation: Normal sinus rhythm Early repolarization Normal ECG When compared with ECG of 25-Jul-2022 22:24, Vent. rate has decreased BY  34 BPM No significant change since last tracing Confirmed by Jeffrie Anes ([id number]) on 05/02/2024 1:50:24 PM    Results LABS ANA titer: Positive, speckled pattern MCV: Elevated  RADIOLOGY Liver ultrasound: Mild non-fatty liver disease (2023)  DIAGNOSTIC Echocardiogram: Normal (2021) EKG: Diffuse J point elevation (2021) Holter monitor: No significant arrhythmias, heart rate up to 160 bpm, occasional premature beats Risk Assessment/Calculations:            Physical Exam:   VS:  BP 108/72   Pulse 67   Ht 5' 2 (1.575 m)   Wt 110 lb 6.4 oz (50.1 kg)   LMP  (LMP Unknown)   SpO2 99%   BMI 20.19 kg/m    Wt Readings from Last 3 Encounters:  05/06/24 113 lb (51.3  kg)  05/02/24 110 lb 6.4 oz (50.1 kg)  04/10/24 111 lb 1.9 oz (50.4 kg)    GEN: Thin in NAD CARDIAC: RRR, no murmurs, no rubs, no gallops RESPIRATORY:  Clear to auscultation without rales, wheezing or rhonchi  ABDOMEN: Soft, non-tender, non-distended EXTREMITIES:  No edema; No deformity   ASSESSMENT AND PLAN: .    Assessment and  Plan Assessment & Plan Dysautonomia Exhibiting symptoms consistent with dysautonomia, including syncope, tachycardia, and vertigo, persisting for several years. Previous echocardiogram and cardiac monitor revealed no dangerous arrhythmias, but heart rate reached 160 bpm. Symptoms may be related to postural orthostatic tachycardia syndrome (POTS) or another form of dysautonomia. Positive ANA titer with speckled pattern, consistent with 2019 results. Additional symptoms include water  retention, swelling, and pitting edema, particularly in the abdomen and thighs, along with dizziness and near-syncope. Discussed management strategies involving hydration, salt, and protein intake, emphasizing balance to avoid symptom exacerbation. Potential benefits of liquid IV or similar hydration solutions were considered. Encouraged exercises such as floor exercises and recumbent biking to strengthen the body and manage symptoms. Explained that symptoms are real and treatable, with potential for spontaneous improvement over time. - Order echocardiogram to reassess cardiac function - Encourage increased hydration with water  and possibly liquid IV - Advise moderate salt intake, considering tolerance - Encourage increased protein intake - Recommend continuation of floor exercises and recumbent biking - Advise use of compression garments as needed  Ehlers-Danlos syndrome Ehlers-Danlos syndrome contributes to hypermobility and joint issues. Emphasized the importance of careful exercise to avoid joint injury, engaging in appropriate exercises with weight restrictions to strengthen muscles around joints. - Continue current exercise regimen with attention to joint protection  Gastroparesis Gastroparesis contributes to dietary restrictions and challenges in maintaining adequate nutrition. Avoids fatty foods and pork, focusing on chicken, eggs, seafood, and low-fat beef. Uses protein powders and collagen supplements to increase  protein intake. 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 Reports improved ability to maintain weight and tolerate food post-surgery.  History of sepsis Sepsis in 2023.  Non-alcoholic fatty liver disease Mild non-alcoholic fatty liver disease noted in 2023.  Abnormal uterine bleeding with total abdominal hysterectomy Abnormal uterine bleeding managed with total abdominal hysterectomy.           Signed, Oneil Parchment, MD

## 2024-05-10 LAB — HYMENOPTERA VENOM ALLERGY II
Bumblebee: <0.1 kU/L
Hornet, White Face, IgE: <0.1 kU/L
Hornet, Yellow, IgE: <0.1 kU/L
I001-IgE Honeybee: <0.1 kU/L
I003-IgE Yellow Jacket: <0.1 kU/L
I004-IgE Paper Wasp: <0.1 kU/L
I208-IgE Api m 1: <0.1 kU/L
I209-IgE Ves v 5: <0.1 kU/L
I210-IgE Pol d 5: <0.1 kU/L
I211-IgE Ves v 1: <0.1 kU/L
I214-IgE Api m 2: <0.1 kU/L
I215-IgE Api m 3: <0.1 kU/L
I216-IgE Api m 5: <0.1 kU/L
I217-IgE Api m 10: <0.1 kU/L
Tryptase: 5.2 ug/L (ref 2.2–13.2)

## 2024-05-10 LAB — ALLERGEN FIRE ANT: I070-IgE Fire Ant (Invicta): <0.1 kU/L

## 2024-05-10 LAB — ALLERGEN COMPONENT COMMENTS

## 2024-05-11 ENCOUNTER — Ambulatory Visit
Admission: EM | Admit: 2024-05-11 | Discharge: 2024-05-11 | Disposition: A | Discharge status: Home / Self Care | Attending: Family Medicine | Admitting: Family Medicine

## 2024-05-11 ENCOUNTER — Ambulatory Visit: Payer: Self-pay | Admitting: Allergy

## 2024-05-11 ENCOUNTER — Emergency Department (HOSPITAL_BASED_OUTPATIENT_CLINIC_OR_DEPARTMENT_OTHER)
Admission: EM | Admit: 2024-05-11 | Discharge: 2024-05-11 | Disposition: A | Discharge status: Home / Self Care | Attending: Emergency Medicine | Admitting: Emergency Medicine

## 2024-05-11 ENCOUNTER — Encounter (HOSPITAL_BASED_OUTPATIENT_CLINIC_OR_DEPARTMENT_OTHER): Payer: Self-pay | Admitting: Emergency Medicine

## 2024-05-11 ENCOUNTER — Other Ambulatory Visit: Payer: Self-pay

## 2024-05-11 ENCOUNTER — Emergency Department (HOSPITAL_BASED_OUTPATIENT_CLINIC_OR_DEPARTMENT_OTHER)

## 2024-05-11 DIAGNOSIS — L299 Pruritus, unspecified: Secondary | ICD-10-CM | POA: Diagnosis not present

## 2024-05-11 DIAGNOSIS — R531 Weakness: Secondary | ICD-10-CM | POA: Insufficient documentation

## 2024-05-11 DIAGNOSIS — Z90710 Acquired absence of both cervix and uterus: Secondary | ICD-10-CM | POA: Diagnosis not present

## 2024-05-11 DIAGNOSIS — R1084 Generalized abdominal pain: Secondary | ICD-10-CM | POA: Diagnosis not present

## 2024-05-11 DIAGNOSIS — R4182 Altered mental status, unspecified: Secondary | ICD-10-CM | POA: Diagnosis not present

## 2024-05-11 DIAGNOSIS — R0789 Other chest pain: Secondary | ICD-10-CM | POA: Insufficient documentation

## 2024-05-11 DIAGNOSIS — R21 Rash and other nonspecific skin eruption: Secondary | ICD-10-CM | POA: Insufficient documentation

## 2024-05-11 DIAGNOSIS — J45909 Unspecified asthma, uncomplicated: Secondary | ICD-10-CM | POA: Insufficient documentation

## 2024-05-11 DIAGNOSIS — T7840XA Allergy, unspecified, initial encounter: Secondary | ICD-10-CM | POA: Diagnosis not present

## 2024-05-11 DIAGNOSIS — R221 Localized swelling, mass and lump, neck: Secondary | ICD-10-CM | POA: Diagnosis not present

## 2024-05-11 DIAGNOSIS — R109 Unspecified abdominal pain: Secondary | ICD-10-CM | POA: Diagnosis not present

## 2024-05-11 LAB — URINE DRUG SCREEN
Amphetamines: NOT DETECTED
Barbiturates: NOT DETECTED
Benzodiazepines: NOT DETECTED
Cocaine: NOT DETECTED
Fentanyl: NOT DETECTED
Methadone Scn, Ur: NOT DETECTED
Opiates: NOT DETECTED
Tetrahydrocannabinol: NOT DETECTED

## 2024-05-11 LAB — RESP PANEL BY RT-PCR (RSV, FLU A&B, COVID)  RVPGX2
Influenza A by PCR: NEGATIVE
Influenza B by PCR: NEGATIVE
Resp Syncytial Virus by PCR: NEGATIVE
SARS Coronavirus 2 by RT PCR: NEGATIVE

## 2024-05-11 LAB — COMPREHENSIVE METABOLIC PANEL WITH GFR
ALT: 16 U/L (ref 0–44)
AST: 23 U/L (ref 15–41)
Albumin: 4.2 g/dL (ref 3.5–5.0)
Alkaline Phosphatase: 55 U/L (ref 38–126)
Anion gap: 9 (ref 5–15)
BUN: 12 mg/dL (ref 6–20)
CO2: 25 mmol/L (ref 22–32)
Calcium: 9.8 mg/dL (ref 8.9–10.3)
Chloride: 106 mmol/L (ref 98–111)
Creatinine, Ser: 0.87 mg/dL (ref 0.44–1.00)
GFR, Estimated: >60 mL/min (ref >60)
Glucose, Bld: 86 mg/dL (ref 70–99)
Potassium: 3.3 mmol/L — ABNORMAL LOW (ref 3.5–5.1)
Sodium: 140 mmol/L (ref 135–145)
Total Bilirubin: 0.3 mg/dL (ref 0.0–1.2)
Total Protein: 6.9 g/dL (ref 6.5–8.1)

## 2024-05-11 LAB — URINALYSIS, W/ REFLEX TO CULTURE (INFECTION SUSPECTED)
Bilirubin Urine: NEGATIVE
Glucose, UA: NEGATIVE mg/dL
Hgb urine dipstick: NEGATIVE
Leukocytes,Ua: NEGATIVE
Nitrite: NEGATIVE
Specific Gravity, Urine: 1.03 (ref 1.005–1.030)
pH: 5.5 (ref 5.0–8.0)

## 2024-05-11 LAB — CBC
HCT: 38.1 % (ref 36.0–46.0)
Hemoglobin: 13.2 g/dL (ref 12.0–15.0)
MCH: 31.6 pg (ref 26.0–34.0)
MCHC: 34.6 g/dL (ref 30.0–36.0)
MCV: 91.1 fL (ref 80.0–100.0)
Platelets: 213 10*3/uL (ref 150–400)
RBC: 4.18 MIL/uL (ref 3.87–5.11)
RDW: 12.5 % (ref 11.5–15.5)
WBC: 6.2 10*3/uL (ref 4.0–10.5)
nRBC: 0 % (ref 0.0–0.2)

## 2024-05-11 LAB — LIPASE, BLOOD: Lipase: 37 U/L (ref 11–51)

## 2024-05-11 LAB — TROPONIN T, HIGH SENSITIVITY
Troponin T High Sensitivity: <15 ng/L (ref <19)
Troponin T High Sensitivity: <15 ng/L (ref <19)

## 2024-05-11 LAB — PREGNANCY, URINE: Preg Test, Ur: NEGATIVE

## 2024-05-11 LAB — SEDIMENTATION RATE: Sed Rate: 7 mm/h (ref 0–22)

## 2024-05-11 LAB — D-DIMER, QUANTITATIVE: D-Dimer, Quant: <0.27 ug{FEU}/mL (ref 0.00–0.50)

## 2024-05-11 MED ORDER — METHYLPREDNISOLONE 4 MG PO TBPK: ORAL_TABLET | ORAL | 0 refills | Status: DC

## 2024-05-11 MED ORDER — POTASSIUM CHLORIDE CRYS ER 20 MEQ PO TBCR
40.0000 meq | EXTENDED_RELEASE_TABLET | Freq: Once | ORAL | Status: AC
  Administered 2024-05-11: 40 meq via ORAL
  Filled 2024-05-11: qty 2

## 2024-05-11 MED ORDER — SODIUM CHLORIDE 0.9 % IV BOLUS
1000.0000 mL | Freq: Once | INTRAVENOUS | Status: AC
  Administered 2024-05-11: 1000 mL via INTRAVENOUS

## 2024-05-11 MED ORDER — DIPHENHYDRAMINE HCL 50 MG/ML IJ SOLN
25.0000 mg | Freq: Once | INTRAMUSCULAR | Status: AC
  Administered 2024-05-11: 25 mg via INTRAVENOUS
  Filled 2024-05-11: qty 1

## 2024-05-11 MED ORDER — IOHEXOL 300 MG/ML  SOLN
100.0000 mL | Freq: Once | INTRAMUSCULAR | Status: AC | PRN
  Administered 2024-05-11: 100 mL via INTRAVENOUS

## 2024-05-11 MED ORDER — FAMOTIDINE 20 MG PO TABS
20.0000 mg | ORAL_TABLET | Freq: Once | ORAL | Status: AC
  Administered 2024-05-11: 20 mg via ORAL
  Filled 2024-05-11: qty 1

## 2024-05-11 MED ORDER — METHYLPREDNISOLONE ACETATE 80 MG/ML IJ SUSP
80.0000 mg | Freq: Once | INTRAMUSCULAR | Status: AC
  Administered 2024-05-11: 80 mg via INTRAMUSCULAR

## 2024-05-11 NOTE — ED Notes (Signed)
 ED Provider at bedside.

## 2024-05-11 NOTE — Discharge Instructions (Addendum)
 Advised patient take medication as directed with food to completion.  Encouraged to increase daily water intake to 64 ounces per day while taking this medication.  Advised if symptoms worsen and/or unresolved please follow-up with your PCP or here for further evaluation.

## 2024-05-11 NOTE — ED Triage Notes (Addendum)
 [facility]. Last weekend patient was stung by a yellow jacket and has had a rash on her right leg  near the ankle. Pt was seen by an allergies and was told she is not anaphylactic and was given a rx for allergia which she has been taking.   She woke this morning with working itchiness allover, skin feels like on fire and itchy scratchy throat and weakness and fatigue. Pt has taken Pepcid , benadryl , fexofenadine    Pt has history of dysautonomia but reports this feels different than her normal reactions.    Pt reports yesterday she did eat shrimp but allergist has said no reaction.

## 2024-05-11 NOTE — ED Provider Notes (Signed)
 [facility]    CSN: [id number] Arrival date & time: 05/11/24  1021      History   Chief Complaint No chief complaint on file.   HPI Rachel [name] is a 38 y.o. female.   HPI Pleasant 38 year old female reports woke up this morning with itchiness all over since skin feels like on fire.  Patient reports taking OTC Allegra  for pruritus.  PMH with extensive medical history including bipolar disorder, Ehlers Danlos syndrome, and gastroparesis.  Patient was evaluated on 05/06/2024 by allergist for hymenoptera allergy , urticaria and rash and other nonspecific skin eruption  Past Medical History:  Diagnosis Date   ADHD (attention deficit hyperactivity disorder)    Anemia    Anxiety    Asthma    Bipolar disorder (HCC)    Complication of anesthesia    slow to wake up    Delayed gastric emptying 02/23/2020   Depression    DUB (dysfunctional uterine bleeding) 02/23/2020   Eczema    Ehlers-Danlos syndrome    Endometriosis    Gastroparesis    GERD (gastroesophageal reflux disease)    Heart murmur    Hiatal hernia 04/06/2020   Myalgia 04/06/2020   Nausea & vomiting 09/22/2022   Parasitic infection 02/23/2020   Pneumonia    Poor appetite 02/23/2020   Recurrent upper respiratory infection (URI)    RUQ pain 02/23/2020   Vitamin D  deficiency    Weight loss 02/23/2020    Patient Active Problem List   Diagnosis Date Noted   Acute stress reaction 06/14/2023   Chronic migraine w/o aura w/o status migrainosus, not intractable 02/23/2023   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   Memory loss 12/25/2022   Paresthesia 12/25/2022   Other allergic rhinitis 12/04/2022   Allergic conjunctivitis of both eyes 12/04/2022   Mild intermittent asthma without complication 12/04/2022   Other atopic dermatitis 12/04/2022   Allergic reaction 12/04/2022   Rash and other nonspecific skin eruption 12/04/2022   Multiple drug allergies 12/04/2022   Other adverse food reactions, not  elsewhere classified, subsequent encounter 12/04/2022   Abnormal CT of the abdomen 09/23/2022   Abdominal pain 09/22/2022   Asthma, chronic 09/22/2022   HLD (hyperlipidemia) 09/22/2022   Nausea & vomiting 09/22/2022   Iron  deficiency anemia 01/20/2022   Acute hyponatremia 01/18/2022   Pyelonephritis 01/18/2022   Protein-calorie malnutrition, severe 01/18/2022   sepsis secondary to pyelonephritis of left kidney 01/17/2022   Sepsis (HCC) 01/17/2022   Chronic abdominal pain 01/17/2022   Dyspnea 01/17/2022   Underweight 01/17/2022   Irritable bowel syndrome (IBS) 01/17/2022   Hypokalemia 01/17/2022   Vasomotor symptoms due to menopause 08/17/2021   S/P laparoscopic hysterectomy 07/12/2021   Preoperative exam for gynecologic surgery 06/20/2021   Draining postoperative wound 02/28/2021   Encounter for postoperative care 02/16/2021   Endometriosis determined by laparoscopy    Pelvic pain in female 10/11/2020   Presence of subdermal contraceptive implant 10/11/2020   Dysmenorrhea 10/11/2020   Dyspareunia in female 10/11/2020   NSVT (nonsustained ventricular tachycardia) (HCC) 09/03/2020   Gastroparesis 08/18/2020   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   Palpitations 07/26/2020   Family history of connective tissue disease 07/26/2020   Myalgia 04/06/2020   Hiatal hernia 04/06/2020   Recurrent infections 02/23/2020   Weight loss 02/23/2020   Poor appetite 02/23/2020   RUQ pain 02/23/2020   Delayed gastric emptying 02/23/2020   DUB (dysfunctional uterine bleeding) 02/23/2020   Cervical radiculopathy 05/31/2018   Hyperalgesia 05/31/2018   Muscle weakness 03/13/2018   GERD (gastroesophageal  reflux disease) 12/26/2017   ADHD (attention deficit hyperactivity disorder) 05/07/2012   Bipolar 1 disorder (HCC) 05/07/2012    Past Surgical History:  Procedure Laterality Date   barium study     COLONOSCOPY     CYSTOSCOPY N/A 07/12/2021   Procedure: CYSTOSCOPY;  Surgeon: Zina Jerilynn LABOR,  MD;  Location: [facility] OR;  Service: Gynecology;  Laterality: N/A;   ESOPHAGOGASTRODUODENOSCOPY  2011   and a ph study as well.    LAPAROSCOPIC GASTRIC RESECTION N/A 01/22/2023   Procedure: LAPAROSCOPIC GASTRIC PEXY;  Surgeon: Lyndel Deward PARAS, MD;  Location: [facility];  Service: General;  Laterality: N/A;   LAPAROSCOPY N/A 01/26/2021   Procedure: LAPAROSCOPY DIAGNOSTIC;  Surgeon: Zina Jerilynn LABOR, MD;  Location: [facility];  Service: Gynecology;  Laterality: N/A;   NASAL SEPTUM SURGERY     SINOSCOPY     TONGUE FLAP     TOTAL LAPAROSCOPIC HYSTERECTOMY WITH SALPINGECTOMY Bilateral 07/12/2021   Procedure: TOTAL LAPAROSCOPIC HYSTERECTOMY WITH SALPINGECTOMY, OOPHORECTOMY;  Surgeon: Zina Jerilynn LABOR, MD;  Location: [facility] OR;  Service: Gynecology;  Laterality: Bilateral;   UPPER GASTROINTESTINAL ENDOSCOPY     WISDOM TOOTH EXTRACTION      OB History     Gravida  0   Para  0   Term  0   Preterm  0   AB  0   Living  0      SAB  0   IAB  0   Ectopic  0   Multiple  0   Live Births  0            Home Medications    Prior to Admission medications   Medication Sig Start Date End Date Taking? Authorizing Provider  methylPREDNISolone  (MEDROL  DOSEPAK) 4 MG TBPK tablet Take as directed 05/11/24  Yes Teddy Sharper, FNP  acetaminophen  (TYLENOL ) 500 MG tablet Take 500-1,000 mg by mouth every 6 (six) hours as needed for moderate pain or mild pain.    [provider]  albuterol  (VENTOLIN  HFA) 108 (90 Base) MCG/ACT inhaler Inhale 2 puffs into the lungs every 4 (four) hours as needed for wheezing or shortness of breath (coughing fits). 03/26/24   Luke Orlan HERO, DO  atorvastatin  (LIPITOR) 20 MG tablet Take 1 tablet (20 mg total) by mouth daily. 02/19/24   Newlin, Enobong, MD  budesonide -formoterol  (SYMBICORT ) 80-4.5 MCG/ACT inhaler Inhale 2 puffs into the lungs in the morning and at bedtime. Rinse mouth after each use. Use for 1-2 weeks during asthma flares. 03/26/24   Luke Orlan HERO, DO  Calcium -Magnesium-Vitamin D  600-40-500 MG-MG-UNIT TB24 Take 1 tablet by mouth every evening.    [provider]  cromolyn  (OPTICROM ) 4 % ophthalmic solution Place 1 drop into both eyes 4 (four) times daily as needed (itchy/watery eyes). 03/26/24   Luke Orlan HERO, DO  EPINEPHRINE  0.3 mg/0.3 mL IJ SOAJ injection INJECT INTO THE MIDDLE OF THE OUTER THIGH AND HOLD FOR 3 SECONDS AS NEEDED FOR SEVERE ALLERGIC REACTION THEN CALL 911 IF USED. 03/14/24   Tobie Arleta SQUIBB, MD  famotidine  (PEPCID ) 20 MG tablet Take 1 tablet (20 mg total) by mouth 2 (two) times daily. 03/26/24   Luke Orlan HERO, DO  ferrous sulfate  (FEROSUL) 325 (65 FE) MG tablet TAKE 1 TABLET BY MOUTH DAILY 02/19/24   Newlin, Enobong, MD  fexofenadine  (ALLEGRA  ALLERGY ) 180 MG tablet Take 2 tablets (360 mg total) by mouth in the morning and at bedtime. 05/06/24   Luke Orlan HERO, DO  hydrocortisone cream 1 % Apply 1 Application topically 2 (two) times daily.    [provider]  hydrOXYzine  (ATARAX ) 10 MG tablet Take 1 tablet (10 mg total) by mouth 3 (three) times daily as needed for itching. 04/10/24   Luke Orlan HERO, DO  ibuprofen  (ADVIL ) 200 MG tablet Take 400-800 mg by mouth every 8 (eight) hours as needed (pain.).    [provider]  LINZESS  290 MCG CAPS capsule TAKE 1 CAPSULE BY MOUTH DAILY BEFORE BREAKFAST 04/16/24   Nandigam, Kavitha V, MD  Melatonin 10 MG TABS Take 10 mg by mouth at bedtime.    [provider]  MIRALAX  17 GM/SCOOP powder Take 17-34 g by mouth daily. Mix 17 g of powder into water  and drink once a day    [provider]  PREMARIN  0.625 MG tablet TAKE 1 TABLET BY MOUTH DAILY FOR 21 DAYS. THEN DO NOT TAKE FOR 7 DAYS 03/24/24   Zina Jerilynn LABOR, MD  Prenatal Vit-Fe Fumarate-FA (MULTIVITAMIN-PRENATAL) 27-0.8 MG TABS tablet Take 1 tablet by mouth daily at 12 noon.    [provider]  promethazine  (PHENERGAN ) 12.5 MG tablet Take 1 tablet (12.5 mg total) by mouth every 8 (eight) hours as  needed for nausea. 09/26/23   Newlin, Enobong, MD  rizatriptan  (MAXALT -MLT) 10 MG disintegrating tablet May repeat in 2 hours if needed 02/23/23   Onita Duos, MD  sennosides-docusate sodium  (SENOKOT-S) 8.6-50 MG tablet Take 2-3 tablets by mouth daily as needed for constipation.    [provider]  triamcinolone  ointment (KENALOG ) 0.1 % Apply 1 Application topically 2 (two) times daily as needed (rash flare). Do not use on the face, neck, armpits or groin area. Do not use more than 3 weeks in a row. 04/10/24   Luke Orlan HERO, DO    Family History Family History  Problem Relation Age of Onset   Eczema Mother    Urticaria Mother    Allergic rhinitis Mother    Colon polyps Mother    Depression Mother    Anxiety disorder Mother    Hypertension Mother    Hyperlipidemia Mother    Asthma Mother    Endometriosis Mother    Fibroids Mother    Fibromyalgia Mother    Migraines Mother    Irritable bowel syndrome Mother    Allergic rhinitis Father    Depression Father    Hypertension Father    Hyperlipidemia Father    Bipolar disorder Father    Urticaria Sister    Allergic rhinitis Sister    Bipolar disorder Sister    ADD / ADHD Sister    Asthma Sister    Endometriosis Sister    Seizures Sister    Bipolar disorder Sister    Anxiety disorder Sister    Heart disease Maternal Grandmother    Endometriosis Maternal Grandmother    Fibroids Maternal Grandmother    Hyperlipidemia Maternal Grandmother    Hypertension Maternal Grandmother    Diabetes Maternal Grandmother    Fibromyalgia Maternal Grandmother    Heart disease Maternal Grandfather    Diabetes Maternal Grandfather    Heart disease Paternal Grandmother    Heart disease Paternal Grandfather    Colon cancer Maternal Great-grandmother    Pancreatic cancer Neg Hx    Esophageal cancer Neg Hx    Stomach cancer Neg Hx     Social History Social History   Tobacco Use   Smoking status: Never    Passive exposure: Never    Smokeless tobacco:  Never  Vaping Use   Vaping status: Never Used  Substance Use Topics   Alcohol use: Yes    Alcohol/week: 1.0 standard drink of alcohol    Types: 1 Glasses of wine per week    Comment: 1 glass of wine every other week   Drug use: No     Allergies   Amitriptyline , Amphetamine-dextroamphetamine, Lupron [leuprolide], Orilissa  [elagolix], Penicillins, Wound dressing adhesive, Betadine  [povidone-iodine ], Chlorhexidine , Doxycycline, Mixed ragweed, Olanzapine, Other, Prozac [fluoxetine hcl], Stevia glycerite extract [flavoring agent (non-screening)], Tape, Triple antibiotic pain relief [neomy-bacit-polymyx-pramoxine], and Reglan  [metoclopramide ]   Review of Systems Review of Systems  Skin:        Pruritus of entire body     Physical Exam Triage Vital Signs ED Triage Vitals  Encounter Vitals Group     BP 05/11/24 1033 (!) 142/98     Girls Systolic BP Percentile --      Girls Diastolic BP Percentile --      Boys Systolic BP Percentile --      Boys Diastolic BP Percentile --      Pulse Rate 05/11/24 1033 60     Resp 05/11/24 1033 19     Temp 05/11/24 1033 97.6 F (36.4 C)     Temp src --      SpO2 05/11/24 1033 100 %     Weight --      Height --      Head Circumference --      Peak Flow --      Pain Score 05/11/24 1032 9     Pain Loc --      Pain Education --      Exclude from Growth Chart --    No data found.  Updated Vital Signs BP (!) 142/98   Pulse 60   Temp 97.6 F (36.4 C)   Resp 19   LMP  (LMP Unknown)   SpO2 100%    Physical Exam Vitals and nursing note reviewed.  Constitutional:      Appearance: Normal appearance. She is normal weight.  HENT:     Head: Normocephalic and atraumatic.     Mouth/Throat:     Mouth: Mucous membranes are moist.     Pharynx: Oropharynx is clear.   Eyes:     Extraocular Movements: Extraocular movements intact.     Conjunctiva/sclera: Conjunctivae normal.     Pupils: Pupils are equal, round, and reactive  to light.    Cardiovascular:     Rate and Rhythm: Normal rate and regular rhythm.     Pulses: Normal pulses.     Heart sounds: Normal heart sounds.  Pulmonary:     Effort: Pulmonary effort is normal.     Breath sounds: Normal breath sounds. No wheezing, rhonchi or rales.   Musculoskeletal:        General: Normal range of motion.   Skin:    General: Skin is warm and dry.   Neurological:     General: No focal deficit present.     Mental Status: She is alert and oriented to person, place, and time. Mental status is at baseline.   Psychiatric:        Mood and Affect: Mood normal.        Behavior: Behavior normal.      UC Treatments / Results  Labs (all labs ordered are listed, but only abnormal results are displayed) Labs Reviewed - No data to display  EKG   Radiology No results found.  Procedures  Procedures (including critical care time)  Medications Ordered in UC Medications  methylPREDNISolone  acetate (DEPO-MEDROL ) injection 80 mg (80 mg Intramuscular Given 05/11/24 1103)    Initial Impression / Assessment and Plan / UC Course  I have reviewed the triage vital signs and the nursing notes.  Pertinent labs & imaging results that were available during my care of the patient were reviewed by me and considered in my medical decision making (see chart for details).     MDM: 1.  Pruritus-IM Depo-Medrol  80 mg given once in clinic, Rx'd Medrol  Dosepak: Take as directed. Advised patient take medication as directed with food to completion.  Encouraged to increase daily water  intake to 64 ounces per day while taking this medication.  Advised if symptoms worsen and/or unresolved please follow-up with your PCP or here for further evaluation.  Final Clinical Impressions(s) / UC Diagnoses   Final diagnoses:  Pruritus     Discharge Instructions      Advised patient take medication as directed with food to completion.  Encouraged to increase daily water  intake to 64  ounces per day while taking this medication.  Advised if symptoms worsen and/or unresolved please follow-up with your PCP or here for further evaluation.     ED Prescriptions     Medication Sig Dispense Auth. Provider   methylPREDNISolone  (MEDROL  DOSEPAK) 4 MG TBPK tablet Take as directed 1 each Teddy Sharper, FNP      PDMP not reviewed this encounter.   Teddy Sharper, FNP 05/11/24 1108

## 2024-05-11 NOTE — ED Provider Notes (Signed)
 [place] [facility]MENT AT [facility] Provider Note   CSN: [id number] Arrival date & time: 05/11/24  1129     Patient presents with: Weakness   Rachel [name] is a 38 y.o. female with PMHx ADHD, anemia, anxiety, asthma, bipolar disorder, depression, ehlers-danlos syndome, endometriosis, gastroparesis, GERD, dysautonomia who presents to the ED with innumerable and vague symptoms. Patient talking about multiple chronic symptoms such as intermittent allergic reactions over the past many months in which she follows with an allergist for. Patient stating that she has tested negative for any type of allergen reaction with her allergist despite having multiple allergic reactions yearly.   Patient stating that she is here today d/t symptoms that have been happening since being stung by a yellow jacket around 8 days ago. Patient stating that she was seen by her allergist a couple days later and tested negative for being allergic to yellow jacket stings.   Patient stating that she woke up this morning with right side chest/rib pain, generalized abdominal pain, generalized weakness, generalized body aches, an episode diarrhea, and a rash on her stomach. The rash and diarrhea has since resolved. Patient stating that it is hard to speak because it feels like her throat is swollen. Patient also stating that her skin feels like it is on fire. Patient also stating that she is freezing cold. She also endorses intermittent SOB. Patient stating that her symptoms today feel similar to an anaphylaxis episode in 2022.  Patient took 50mg  benadryl  and 20mg  Pepcid  at 9AM this morning. Patient then went to UC who provided her with 80mg  Solu-Medrol . Patient stating that she feels the same - but sleepier since receiving this steroid injection.   Denies new soap, detergents, foods, medications this week.       Weakness      Prior to Admission medications   Medication Sig Start Date End  Date Taking? Authorizing Provider  acetaminophen  (TYLENOL ) 500 MG tablet Take 500-1,000 mg by mouth every 6 (six) hours as needed for moderate pain or mild pain.    [provider]  albuterol  (VENTOLIN  HFA) 108 (90 Base) MCG/ACT inhaler Inhale 2 puffs into the lungs every 4 (four) hours as needed for wheezing or shortness of breath (coughing fits). 03/26/24   Luke Orlan HERO, DO  atorvastatin  (LIPITOR) 20 MG tablet Take 1 tablet (20 mg total) by mouth daily. 02/19/24   Newlin, Enobong, MD  budesonide -formoterol  (SYMBICORT ) 80-4.5 MCG/ACT inhaler Inhale 2 puffs into the lungs in the morning and at bedtime. Rinse mouth after each use. Use for 1-2 weeks during asthma flares. 03/26/24   Luke Orlan HERO, DO  Calcium -Magnesium-Vitamin D  600-40-500 MG-MG-UNIT TB24 Take 1 tablet by mouth every evening.    [provider]  cromolyn  (OPTICROM ) 4 % ophthalmic solution Place 1 drop into both eyes 4 (four) times daily as needed (itchy/watery eyes). 03/26/24   Luke Orlan HERO, DO  EPINEPHRINE  0.3 mg/0.3 mL IJ SOAJ injection INJECT INTO THE MIDDLE OF THE OUTER THIGH AND HOLD FOR 3 SECONDS AS NEEDED FOR SEVERE ALLERGIC REACTION THEN CALL 911 IF USED. 03/14/24   Tobie Arleta SQUIBB, MD  famotidine  (PEPCID ) 20 MG tablet Take 1 tablet (20 mg total) by mouth 2 (two) times daily. 03/26/24   Luke Orlan HERO, DO  ferrous sulfate  (FEROSUL) 325 (65 FE) MG tablet TAKE 1 TABLET BY MOUTH DAILY 02/19/24   Newlin, Enobong, MD  fexofenadine  (ALLEGRA  ALLERGY ) 180 MG tablet Take 2 tablets (360 mg total) by mouth in the  morning and at bedtime. 05/06/24   Luke Orlan HERO, DO  hydrocortisone cream 1 % Apply 1 Application topically 2 (two) times daily.    [provider]  hydrOXYzine  (ATARAX ) 10 MG tablet Take 1 tablet (10 mg total) by mouth 3 (three) times daily as needed for itching. 04/10/24   Luke Orlan HERO, DO  ibuprofen  (ADVIL ) 200 MG tablet Take 400-800 mg by mouth every 8 (eight) hours as needed (pain.).    [provider]  LINZESS   290 MCG CAPS capsule TAKE 1 CAPSULE BY MOUTH DAILY BEFORE BREAKFAST 04/16/24   Nandigam, Kavitha V, MD  Melatonin 10 MG TABS Take 10 mg by mouth at bedtime.    [provider]  methylPREDNISolone  (MEDROL  DOSEPAK) 4 MG TBPK tablet Take as directed 05/11/24   Teddy Sharper, FNP  MIRALAX  17 GM/SCOOP powder Take 17-34 g by mouth daily. Mix 17 g of powder into water  and drink once a day    [provider]  PREMARIN  0.625 MG tablet TAKE 1 TABLET BY MOUTH DAILY FOR 21 DAYS. THEN DO NOT TAKE FOR 7 DAYS 03/24/24   Zina Jerilynn LABOR, MD  Prenatal Vit-Fe Fumarate-FA (MULTIVITAMIN-PRENATAL) 27-0.8 MG TABS tablet Take 1 tablet by mouth daily at 12 noon.    [provider]  promethazine  (PHENERGAN ) 12.5 MG tablet Take 1 tablet (12.5 mg total) by mouth every 8 (eight) hours as needed for nausea. 09/26/23   Newlin, Enobong, MD  rizatriptan  (MAXALT -MLT) 10 MG disintegrating tablet May repeat in 2 hours if needed 02/23/23   Onita Duos, MD  sennosides-docusate sodium  (SENOKOT-S) 8.6-50 MG tablet Take 2-3 tablets by mouth daily as needed for constipation.    [provider]  triamcinolone  ointment (KENALOG ) 0.1 % Apply 1 Application topically 2 (two) times daily as needed (rash flare). Do not use on the face, neck, armpits or groin area. Do not use more than 3 weeks in a row. 04/10/24   Luke Orlan HERO, DO    Allergies: Amitriptyline , Amphetamine-dextroamphetamine, Lupron [leuprolide], Orilissa  [elagolix], Penicillins, Wound dressing adhesive, Betadine  [povidone-iodine ], Chlorhexidine , Doxycycline, Mixed ragweed, Olanzapine, Other, Prozac [fluoxetine hcl], Stevia glycerite extract [flavoring agent (non-screening)], Tape, Triple antibiotic pain relief [neomy-bacit-polymyx-pramoxine], and Reglan  [metoclopramide ]    Review of Systems  Neurological:  Positive for weakness.    Updated Vital Signs BP 102/75   Pulse (!) 50   Temp 98.3 F (36.8 C) (Oral)   Resp 11   LMP  (LMP Unknown)   SpO2  100%   Physical Exam Vitals and nursing note reviewed.  Constitutional:      General: She is not in acute distress.    Appearance: She is not ill-appearing or toxic-appearing.  HENT:     Head: Normocephalic and atraumatic.     Mouth/Throat:     Mouth: Mucous membranes are dry.     Comments: +1 tonsils BL. No oropharyngeal swelling or exudates appreciated. No oropharyngeal lesions.  Eyes:     General: No scleral icterus.       Right eye: No discharge.        Left eye: No discharge.     Conjunctiva/sclera: Conjunctivae normal.    Cardiovascular:     Rate and Rhythm: Normal rate and regular rhythm.     Pulses: Normal pulses.     Heart sounds: Normal heart sounds. No murmur heard. Pulmonary:     Effort: Pulmonary effort is normal. No respiratory distress.     Breath sounds: Normal breath sounds. No wheezing, rhonchi or rales.  Abdominal:     General: Abdomen is flat. There is no distension.     Palpations: Abdomen is soft. There is no mass.     Tenderness: There is abdominal tenderness.     Comments: Generalized tenderness to palpation   Musculoskeletal:     Right lower leg: No edema.     Left lower leg: No edema.   Skin:    General: Skin is warm and dry.     Findings: No rash.     Comments: Bee sting on right lateral ankle seems to be healed well without surrounding swelling or erythema   Neurological:     General: No focal deficit present.     Mental Status: She is alert and oriented to person, place, and time. Mental status is at baseline.     Comments: GCS 15. Speech is goal oriented. No deficits appreciated to CN III-XII. No facial droop. EOM intact. Patient moves extremities without ataxia.  Psychiatric:        Mood and Affect: Mood normal.        Behavior: Behavior normal.     (all labs ordered are listed, but only abnormal results are displayed) Labs Reviewed  COMPREHENSIVE METABOLIC PANEL WITH GFR - Abnormal; Notable for the following components:       Result Value   Potassium 3.3 (*)    All other components within normal limits  URINALYSIS, W/ REFLEX TO CULTURE (INFECTION SUSPECTED) - Abnormal; Notable for the following components:   Ketones, ur TRACE (*)    Protein, ur TRACE (*)    Bacteria, UA FEW (*)    All other components within normal limits  RESP PANEL BY RT-PCR (RSV, FLU A&B, COVID)  RVPGX2  CBC  D-DIMER, QUANTITATIVE  LIPASE, BLOOD  PREGNANCY, URINE  URINE DRUG SCREEN  SEDIMENTATION RATE  TROPONIN T, HIGH SENSITIVITY  TROPONIN T, HIGH SENSITIVITY    EKG: EKG Interpretation Date/Time:  Sunday May 11 2024 14:01:52 EDT Ventricular Rate:  50 PR Interval:  181 QRS Duration:  100 QT Interval:  465 QTC Calculation: 424 R Axis:   62  Text Interpretation: Sinus rhythm No significant change since last tracing Confirmed by Patsey Lot [phone]) on 05/11/2024 4:37:46 PM  Radiology: CT Head Wo Contrast Result Date: 05/11/2024 CLINICAL DATA:  Mental status change, unknown cause EXAM: CT HEAD WITHOUT CONTRAST TECHNIQUE: Contiguous axial images were obtained from the base of the skull through the vertex without intravenous contrast. RADIATION DOSE REDUCTION: This exam was performed according to the departmental dose-optimization program which includes automated exposure control, adjustment of the mA and/or kV according to patient size and/or use of iterative reconstruction technique. COMPARISON:  Brain MRI 01/13/2023 FINDINGS: Brain: No intracranial hemorrhage, mass effect, or midline shift. No hydrocephalus. The basilar cisterns are patent. No evidence of territorial infarct or acute ischemia. No extra-axial or intracranial fluid collection. Vascular: No hyperdense vessel or unexpected calcification. Skull: No fracture or focal lesion. Sinuses/Orbits: No acute finding. Other: None IMPRESSION: Negative noncontrast head CT. Electronically Signed   By: Andrea Gasman M.D.   On: 05/11/2024 15:34   CT ABDOMEN PELVIS W  CONTRAST Result Date: 05/11/2024 CLINICAL DATA:  Statin Ayala jacket a few days ago. Rash, itchiness, swelling of throat. Abdominal pain. EXAM: CT ABDOMEN AND PELVIS WITH CONTRAST TECHNIQUE: Multidetector CT imaging of the abdomen and pelvis was performed using the standard protocol following bolus administration of intravenous contrast. RADIATION DOSE REDUCTION: This exam was performed according to the departmental dose-optimization program which includes  automated exposure control, adjustment of the mA and/or kV according to patient size and/or use of iterative reconstruction technique. CONTRAST:  OMNIPAQUE  IOHEXOL  300 MG/ML  SOLN COMPARISON:  03/07/2023. FINDINGS: Lower chest: No acute abnormality. Hepatobiliary: No focal liver abnormality is seen. No gallstones, gallbladder wall thickening, or biliary dilatation. Pancreas: Unremarkable. No pancreatic ductal dilatation or surrounding inflammatory changes. Spleen: Normal in size without focal abnormality. Adrenals/Urinary Tract: The adrenal glands are within normal limits. The kidneys enhance symmetrically. No renal calculus or hydronephrosis bilaterally. The bladder is unremarkable. Stomach/Bowel: Stomach is within normal limits. Appendix is not seen. No evidence of bowel wall thickening, distention, or inflammatory changes. No free air or pneumatosis. Vascular/Lymphatic: No significant vascular findings are present. No enlarged abdominal or pelvic lymph nodes. Reproductive: Status post hysterectomy. No adnexal masses. Other: Uterus is not well seen.  No adnexal mass. Musculoskeletal: No acute osseous abnormality. IMPRESSION: No acute intra-abdominal process. Electronically Signed   By: Leita Birmingham M.D.   On: 05/11/2024 15:25   CT Soft Tissue Neck W Contrast Result Date: 05/11/2024 CLINICAL DATA:  Provided history: Soft tissue infection suspected, neck, x-ray done. Additional history provided: The patient reports she was recently stung by a yellow  jacket, rash, itchiness, swelling of throat. EXAM: CT NECK WITH CONTRAST TECHNIQUE: Multidetector CT imaging of the neck was performed using the standard protocol following the bolus administration of intravenous contrast. RADIATION DOSE REDUCTION: This exam was performed according to the departmental dose-optimization program which includes automated exposure control, adjustment of the mA and/or kV according to patient size and/or use of iterative reconstruction technique. CONTRAST:  OMNIPAQUE  IOHEXOL  300 MG/ML  SOLN COMPARISON:  Neck CT 05/31/2022. FINDINGS: Pharynx and larynx: No appreciable swelling or mass within the oral cavity, pharynx or larynx. No significant airway effacement. Salivary glands: No inflammation, mass, or stone. Thyroid : Unremarkable. Lymph nodes: No pathologically enlarged cervical chain lymph nodes identified. Vascular: The major vascular structures of the neck are patent. Limited intracranial: No evidence of an acute intracranial abnormality within the field of view. Visualized orbits: No orbital mass or acute orbital finding. Mastoids and visualized paranasal sinuses: Small portions of the frontal sinuses are excluded from the field of view superiorly. No significant paranasal sinus disease or mastoid effusion at the imaged levels. Skeleton: No acute fracture or aggressive osseous lesion. Upper chest: No consolidation within the imaged lung apices. IMPRESSION: No appreciable swelling within the oral cavity, pharynx or larynx. No significant airway effacement. Electronically Signed   By: Rockey Childs D.O.   On: 05/11/2024 15:24     Procedures   Medications Ordered in the ED  sodium chloride  0.9 % bolus 1,000 mL (0 mLs Intravenous Stopped 05/11/24 1433)  iohexol  (OMNIPAQUE ) 300 MG/ML solution 100 mL (100 mLs Intravenous Contrast Given 05/11/24 1452)  diphenhydrAMINE  (BENADRYL ) injection 25 mg (25 mg Intravenous Given 05/11/24 1734)  famotidine  (PEPCID ) tablet 20 mg (20 mg Oral  Given 05/11/24 1734)  potassium chloride  SA (KLOR-CON  M) CR tablet 40 mEq (40 mEq Oral Given 05/11/24 1734)                                    Medical Decision Making Amount and/or Complexity of Data Reviewed Labs: ordered. Radiology: ordered.  Risk Prescription drug management.   This patient presents to the ED for concern of multiple complaints, this involves an extensive number of treatment options, and is a complaint that carries with  it a high risk of complications and morbidity.  ACS, PE, pancreatitis, nephrolithiasis, appendicitis, UTI, lupus flare.  This is not an exhaustive list.   Co morbidities that complicate the patient evaluation  ADHD, anemia, anxiety, asthma, bipolar disorder, depression, ehlers-danlos syndome, endometriosis, gastroparesis, GERD, dysautonomia    Additional history obtained:  03/26/2024 CRP and sed rate within normal limits 2022 ECHO: 55-69% EF    Problem List / ED Course / Critical interventions / Medication management  Patient presents to ED concern for multiple complaints.  Patient stating that she has been suffering and searching diagnosis for 7 years for her symptoms.  It appears the patient is mostly concerned for possible anaphylactic reaction.  Physical exam is very reassuring without wheezing, respiratory distress, hypotension, tachycardia, oropharyngeal swelling.  Patient took Benadryl  and Pepcid  earlier this morning in urgent care provided her with a dose of Solu-Medrol .  Patient has an EpiPen  at home but did not use it.  Rest of physical exam reassuring.  Patient afebrile with stable vitals. I Ordered, and personally interpreted labs.  UA not concerning for infection.  UPT negative.  CBC without leukocytosis or anemia.  CMP with mild hypokalemia at 3.3.  Lipase within normal limits.  D-dimer within normal limits.  Respiratory panel negative.  Delta troponins within normal limits.  ESR within normal limits.  UDS within normal limits. I  ordered imaging studies including CT head/soft tissue neck/abdomen/pelvis. I independently visualized and interpreted imaging which showed no acute process. I agree with the radiologist interpretation The patient was maintained on a cardiac monitor.  I personally viewed and interpreted the cardiac monitored which showed an underlying rhythm of: Sinus rhythm. Patient tolerated PO well.  Shared all results with patient.  Answered all questions. Patient expressing fatigue given that her rheumatologist discharged her and no other specialist seems to be providing help for her chronic symptoms. I talked extensively with patient and her mother at bedside and there does not appear to be any emergent process at this time to warrant inpatient treatment. Patient to follow up with PCP. I educated patient on the importance of taking her EpiPen  if she believes that she is having an anaphylactic reaction in the future. Also educated patient to go to [facility] ED after receiving EpiPen . Mother stating that she told the patient to come to [facility] because she was not concerned for severe process today. Staffed with Dr. Patsey who agrees with plan. I have reviewed the patients home medicines and have made adjustments as needed The patient has been appropriately medically screened and/or stabilized in the ED. I have low suspicion for any other emergent medical condition which would require further screening, evaluation or treatment in the ED or require inpatient management. At time of discharge the patient is hemodynamically stable and in no acute distress. I have discussed work-up results and diagnosis with patient and answered all questions. Patient is agreeable with discharge plan. We discussed strict return precautions for returning to the emergency department and they verbalized understanding.     Social Determinants of Health:  none      Final diagnoses:  Allergic reaction, initial encounter  Generalized  abdominal pain  Atypical chest pain  Rash    ED Discharge Orders     None          Hoy Nidia JULIANNA DEVONNA 05/11/24 1739    Patsey Lot, MD 05/11/24 2316

## 2024-05-11 NOTE — ED Notes (Addendum)
 Reviewed discharge instructions and follow-up care with pt and mom. Both verbalized understanding and had no further questions. Pt exited ED without complications.

## 2024-05-11 NOTE — ED Triage Notes (Signed)
 Bite from yellowjacket last Saturday. Seen by allergist Started today with weakness and diarrhea, itching and on fire, feels like throat is swelling  seen at UC given medrol  shot At home Had benadryl  and pepcid .

## 2024-05-11 NOTE — Discharge Instructions (Addendum)
 It was a pleasure caring for you today. Please follow up with your primary care provider. Seek emergency care if experiencing any new or worsening symptoms.

## 2024-05-12 ENCOUNTER — Telehealth: Payer: Self-pay | Admitting: Cardiology

## 2024-05-12 NOTE — Telephone Encounter (Signed)
 Pt was in the ER last night and past wants to make Dr Jeffrie aware.

## 2024-05-13 NOTE — Telephone Encounter (Signed)
 Dr Jeffrie is aware and no new orders given at this time

## 2024-05-15 ENCOUNTER — Ambulatory Visit

## 2024-05-19 ENCOUNTER — Encounter

## 2024-05-29 ENCOUNTER — Ambulatory Visit

## 2024-05-29 DIAGNOSIS — F909 Attention-deficit hyperactivity disorder, unspecified type: Secondary | ICD-10-CM

## 2024-05-29 DIAGNOSIS — F4310 Post-traumatic stress disorder, unspecified: Secondary | ICD-10-CM | POA: Diagnosis not present

## 2024-05-29 DIAGNOSIS — Z658 Other specified problems related to psychosocial circumstances: Secondary | ICD-10-CM
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
## 2024-05-30 NOTE — Patient Instructions (Addendum)
 Center for [facility] at [facility] [address] [place], [place] [address] [phone] (main office) [phone] (Lisha Vitale's office)  SCORE: For the life of your business [email or web address]   [employer] [place]  [email or web address] [phone]  [facility] [email or web address]

## 2024-06-02 ENCOUNTER — Encounter

## 2024-06-09 ENCOUNTER — Ambulatory Visit (INDEPENDENT_AMBULATORY_CARE_PROVIDER_SITE_OTHER)

## 2024-06-09 ENCOUNTER — Ambulatory Visit: Payer: Self-pay | Admitting: Cardiology

## 2024-06-09 DIAGNOSIS — R55 Syncope and collapse: Secondary | ICD-10-CM | POA: Diagnosis not present

## 2024-06-09 LAB — ECHOCARDIOGRAM COMPLETE
AR max vel: 2.99 cm2
AV Area VTI: 2.91 cm2
AV Area mean vel: 2.82 cm2
AV Mean grad: 2 mmHg
AV Peak grad: 3.4 mmHg
Ao pk vel: 0.92 m/s
Area-P 1/2: 4.86 cm2
S' Lateral: 2.9 cm

## 2024-06-12 ENCOUNTER — Ambulatory Visit: Admitting: Clinical

## 2024-06-12 DIAGNOSIS — Z658 Other specified problems related to psychosocial circumstances: Secondary | ICD-10-CM

## 2024-06-12 DIAGNOSIS — F4310 Post-traumatic stress disorder, unspecified: Secondary | ICD-10-CM | POA: Diagnosis not present

## 2024-06-12 DIAGNOSIS — F909 Attention-deficit hyperactivity disorder, unspecified type: Secondary | ICD-10-CM

## 2024-06-12 NOTE — BH Specialist Note (Signed)
 Integrated Behavioral Health via Telemedicine Visit  06/12/2024 Rachel [name] [id number]  Number of Integrated Behavioral Health Clinician visits: Additional Visit  Session Start time: 1016   Session End time: 1115  Total time in minutes: 59  Referring Provider: Jerilynn Buddle, MD Patient/Family location: Home [facility] Provider location: [facility]  All persons participating in visit: [name] and [facility] Negan Grudzien   Types of Service: Individual psychotherapy and Telephone visit  I connected with Rachel [name] [name]'s n/a via  Telephone or Video Enabled Telemedicine Application  (Video is Caregility application) and verified that I am speaking with the correct person using two identifiers. Discussed confidentiality: Yes   I discussed the limitations of telemedicine and the availability of in person appointments.  Discussed there is a possibility of technology failure and discussed alternative modes of communication if that failure occurs.  I discussed that engaging in this telemedicine visit, they consent to the provision of behavioral healthcare and the services will be billed under their insurance.  Patient and/or legal guardian expressed understanding and consented to Telemedicine visit: Yes   Presenting Concerns: Patient and/or family reports the following symptoms/concerns: Progress coping with emotional distress in relationships with newly acquired ability to use healthy detachment; processing a new friendship. Duration of problem: ongoing; Severity of problem: moderate  Patient and/or Family's Strengths/Protective Factors: Social connections and Sense of purpose  Goals Addressed: Patient will:  Reduce symptoms of: anxiety, depression, and stress    Demonstrate ability to: Increase healthy adjustment to current life circumstances and Increase motivation  to adhere to plan of care  Progress towards Goals: Ongoing    Interventions: Interventions utilized:  Motivational Interviewing and Supportive Reflection Standardized Assessments completed: Not Needed  Patient and/or Family Response: Patient agrees with treatment plan.  Clinical Assessment/Diagnosis  Post traumatic stress disorder (PTSD)  Attention deficit hyperactivity disorder (ADHD), unspecified ADHD type  Psychosocial stressors .   Patient may benefit from continued therapeutic intervention .  Plan: Follow up with behavioral health clinician on : Two weeks Behavioral recommendations:  -Continue attending all scheduled medical appointments; follow medical recommendations -Continue daily self-coping strategies as needed (including healthy boundaries in all relationships, self-affirmations, progress videos, time with supportive friends; artistic pursuits) -Continue to consider applying for disability and community resources as discussed at last visit for legal and business support Referral(s): Integrated [employer] (In Clinic)  I discussed the assessment and treatment plan with the patient and/or parent/guardian. They were provided an opportunity to ask questions and all were answered. They agreed with the plan and demonstrated an understanding of the instructions.   They were advised to call back or seek an in-person evaluation if the symptoms worsen or if the condition fails to improve as anticipated.  [name], LCSW     01/23/2024    9:08 AM 04/23/2023    8:50 AM 11/02/2022    9:23 AM 08/02/2022    2:20 PM 08/02/2022    2:11 PM  Depression screen PHQ 2/9  Decreased Interest 2 0 0 0 0  Down, Depressed, Hopeless 2 1 1  0 0  PHQ - 2 Score 4 1 1  0 0  Altered sleeping 3 2 1 2    Tired, decreased energy 3 2 1 2    Change in appetite 3 2 1 1    Feeling bad or failure about yourself  0 0 0 0   Trouble concentrating 2 1 0 1  Moving slowly or  fidgety/restless 0 0 0 0   Suicidal thoughts 0 0 0 0   PHQ-9 Score 15 8 4 6        01/23/2024    9:10 AM 04/23/2023    8:51 AM 11/02/2022    9:24 AM 08/02/2022    2:20 PM  GAD 7 : Generalized Anxiety Score  Nervous, Anxious, on Edge 2 2 1  0  Control/stop worrying 2 1 0 0  Worry too much - different things 2 0 0 0  Trouble relaxing 0 0 1 1  Restless 0 0 0 0  Easily annoyed or irritable 0 0 0 0  Afraid - awful might happen 3 0 0 0  Total GAD 7 Score 9 3 2  1

## 2024-06-20 ENCOUNTER — Other Ambulatory Visit: Payer: Self-pay | Admitting: Allergy

## 2024-06-24 NOTE — Progress Notes (Signed)
 Follow Up Note  RE: Rachel [name] MRN: [id number] DOB: 1986/01/21 Date of Office Visit: 06/25/2024  Referring provider: Delbert Clam, MD Primary care provider: Delbert Clam, MD  Chief Complaint: No chief complaint on file.  History of Present Illness: I had the pleasure of seeing Rachel [name] for a follow up visit at the [facility] on 06/25/2024. She is a 38 y.o. female, who is being followed for hymenoptera allergy , rash, allergic rhinitis, allergic reactions, adverse food reaction, recurrent infections, multiple drug allergies. Her previous allergy  office visit was on 05/06/2024 with Dr. Luke. Today is a regular follow up visit.  Discussed the use of AI scribe software for clinical note transcription with the patient, who gave verbal consent to proceed.  History of Present Illness            2025 labs: Labs negative to stinging insect panel and fire  ant.  Normal tryptase level.  Assessment and Plan: Rachel [name] is a 37 y.o. female with: Hymenoptera allergy  Localized reaction and unclear if her other symptoms are due to the insect sting vs her underlying conditions.  Continue to avoid. Gave handout. Get bloodwork.  Has epinephrine  device on hand if needed.    Rash and other nonspecific skin eruption Pruritus  Past history - breaking out in random itchy rashes since 2015. No specific triggers noted but has a flare every 1-2 months. Symptoms seems to lasts for a few weeks at a time. History of eczema. No prior dermatology evaluation. 2024 bloodwork (CBC diff, CMP, TSH, ANA, crp, esr, CU, tryptase and alpha-gal) normal.  Continue proper skin care. Keep track of outbreaks and take pictures. Continue allegra  2 tablets twice a day as needed.  Continue famotidine  20mg  twice a day.  If symptoms are not controlled or causes drowsiness let us  know. Avoid the following potential triggers: alcohol, tight clothing, NSAIDs, hot showers and  getting overheated. May take hydroxyzine  10mg  every 8 hours as needed for itching.  Use triamcinolone  0.1% ointment twice a day as needed for rash flares. Do not use on the face, neck, armpits or groin area. Do not use more than 3 weeks in a row.  Patient asking about urine test to rule out mcas - will consider ordering at next visit.   Other allergic rhinitis Past history - symptoms which flares in the fall and winter. Sinus surgery in the past.  2 dogs and 2 cats at home. 2024 skin prick testing positive to mold, cat and dog only. 2024 bloodwork positive to cat and dog. Started AIT on 01/04/2023 (M-C-D). Flonase  caused epistaxis.  Interim history - planning to restart injections.  Continue environmental control measures as below. Nasal saline spray (i.e., Simply Saline) or nasal saline lavage (i.e., NeilMed) is recommended as needed and prior to medicated nasal sprays. Use cromolyn  4% 1 drop in each eye up to four times a day as needed for itchy/watery eyes.  Wait 10-15 min before putting your contact lens in.  Hold allergy  injection until cardiology work up is completed.    Allergic reaction Past history - multiple reaction to various medications but then has milder allergic reactions with no known triggers. Keep track of symptoms and episodes.  For mild symptoms you can take over the counter antihistamines (zyrtec  10mg  to 20mg ) and monitor symptoms closely.  If symptoms worsen or if you have severe symptoms including breathing issues, throat closure, significant swelling, whole body hives, severe diarrhea and vomiting, lightheadedness then use epinephrine  and seek  immediate medical care afterwards. Emergency action plan in place.    Other adverse food reactions, not elsewhere classified, subsequent encounter Past history - pork causes perioral sores and tingling. Dairy flares her GI symptoms. 2024 skin testing negative to select foods. Avoid pork and dairy. She may have lactose intolerance.     Recurrent infections Past history - sepsis x 2,  pneumonia, ear infections. Usually needs 2-4 courses of antibiotics per year. 2024 normal immunglobulin levels, tetanus, diphtheria and pneumococcal titers protective.   Keep track of infections and antibiotics use.   Mild intermittent asthma without complication Past history -  usually flares with cold weather and infection. Tried Singulair and Advair in the past.  2024 spirometry was normal. During respiratory infections/flares:  Start Symbicort  80mcg 2 puffs twice a day and rinse mouth after each use for 1-2 weeks until your breathing symptoms return to baseline.  Pretreat with albuterol  2 puffs or albuterol  nebulizer.  If you need to use your albuterol  nebulizer machine back to back within 15-30 minutes with no relief then please go to the ER/urgent care for further evaluation.  May use albuterol  rescue inhaler 2 puffs or nebulizer every 4 to 6 hours as needed for shortness of breath, chest tightness, coughing, and wheezing. May use albuterol  rescue inhaler 2 puffs 5 to 15 minutes prior to strenuous physical activities. Monitor frequency of use - if you need to use it more than twice per week on a consistent basis let us  know.    Multiple drug allergies Past history - reactions to multiple drugs. Orilissa  required ER visit with IM epi. Penicillin reaction as a young child in the form of rash/hives. Avoid drugs on allergy  list.   Numbness and tingling and dizziness  Monitor symptoms.  Consider follow up with PCP regarding these symptoms.   Keep appointment with rheumatology.  Keep appointment with cardiology.  Return in about 2 months (around 07/06/2024). Assessment and Plan              No follow-ups on file.  No orders of the defined types were placed in this encounter.  Lab Orders  No laboratory test(s) ordered today    Diagnostics: Spirometry:  Tracings reviewed. Her effort: {Blank single:19197::Good reproducible  efforts.,It was hard to get consistent efforts and there is a question as to whether this reflects a maximal maneuver.,Poor effort, data can not be interpreted.} FVC: ***L FEV1: ***L, ***% predicted FEV1/FVC ratio: ***% Interpretation: {Blank single:19197::Spirometry consistent with mild obstructive disease,Spirometry consistent with moderate obstructive disease,Spirometry consistent with severe obstructive disease,Spirometry consistent with possible restrictive disease,Spirometry consistent with mixed obstructive and restrictive disease,Spirometry uninterpretable due to technique,Spirometry consistent with normal pattern,No overt abnormalities noted given today's efforts}.  Please see scanned spirometry results for details.  Skin Testing: {Blank single:19197::Select foods,Environmental allergy  panel,Environmental allergy  panel and select foods,Food allergy  panel,None,Deferred due to recent antihistamines use}. *** Results discussed with patient/family.   Medication List:  Current Outpatient Medications  Medication Sig Dispense Refill   acetaminophen  (TYLENOL ) 500 MG tablet Take 500-1,000 mg by mouth every 6 (six) hours as needed for moderate pain or mild pain.     albuterol  (VENTOLIN  HFA) 108 (90 Base) MCG/ACT inhaler Inhale 2 puffs into the lungs every 4 (four) hours as needed for wheezing or shortness of breath (coughing fits). 18 g 1   atorvastatin  (LIPITOR) 20 MG tablet Take 1 tablet (20 mg total) by mouth daily. 90 tablet 1   budesonide -formoterol  (SYMBICORT ) 80-4.5 MCG/ACT inhaler Inhale 2 puffs into the lungs  in the morning and at bedtime. Rinse mouth after each use. Use for 1-2 weeks during asthma flares. 1 each 3   Calcium -Magnesium-Vitamin D  600-40-500 MG-MG-UNIT TB24 Take 1 tablet by mouth every evening.     cromolyn  (OPTICROM ) 4 % ophthalmic solution Place 1 drop into both eyes 4 (four) times daily as needed (itchy/watery eyes). 10 mL 3   EPINEPHRINE   0.3 mg/0.3 mL IJ SOAJ injection INJECT INTO THE MIDDLE OF THE OUTER THIGH AND HOLD FOR 3 SECONDS AS NEEDED FOR SEVERE ALLERGIC REACTION THEN CALL 911 IF USED. 2 each 1   famotidine  (PEPCID ) 20 MG tablet Take 1 tablet (20 mg total) by mouth 2 (two) times daily. 60 tablet 2   ferrous sulfate  (FEROSUL) 325 (65 FE) MG tablet TAKE 1 TABLET BY MOUTH DAILY 90 tablet 1   fexofenadine  (ALLEGRA  ALLERGY ) 180 MG tablet Take 2 tablets (360 mg total) by mouth in the morning and at bedtime. 120 tablet 2   hydrocortisone cream 1 % Apply 1 Application topically 2 (two) times daily.     hydrOXYzine  (ATARAX ) 10 MG tablet Take 1 tablet (10 mg total) by mouth 3 (three) times daily as needed for itching. 60 tablet 1   ibuprofen  (ADVIL ) 200 MG tablet Take 400-800 mg by mouth every 8 (eight) hours as needed (pain.).     LINZESS  290 MCG CAPS capsule TAKE 1 CAPSULE BY MOUTH DAILY BEFORE BREAKFAST 30 capsule 3   Melatonin 10 MG TABS Take 10 mg by mouth at bedtime.     methylPREDNISolone  (MEDROL  DOSEPAK) 4 MG TBPK tablet Take as directed 1 each 0   MIRALAX  17 GM/SCOOP powder Take 17-34 g by mouth daily. Mix 17 g of powder into water  and drink once a day     PREMARIN  0.625 MG tablet TAKE 1 TABLET BY MOUTH DAILY FOR 21 DAYS. THEN DO NOT TAKE FOR 7 DAYS 30 tablet 11   Prenatal Vit-Fe Fumarate-FA (MULTIVITAMIN-PRENATAL) 27-0.8 MG TABS tablet Take 1 tablet by mouth daily at 12 noon.     promethazine  (PHENERGAN ) 12.5 MG tablet Take 1 tablet (12.5 mg total) by mouth every 8 (eight) hours as needed for nausea. 30 tablet 1   rizatriptan  (MAXALT -MLT) 10 MG disintegrating tablet May repeat in 2 hours if needed 10 tablet 5   sennosides-docusate sodium  (SENOKOT-S) 8.6-50 MG tablet Take 2-3 tablets by mouth daily as needed for constipation.     triamcinolone  ointment (KENALOG ) 0.1 % Apply 1 Application topically 2 (two) times daily as needed (rash flare). Do not use on the face, neck, armpits or groin area. Do not use more than 3 weeks in a  row. 80 g 1   No current facility-administered medications for this visit.   Allergies: Allergies  Allergen Reactions   Amitriptyline  Anaphylaxis   Amphetamine-Dextroamphetamine Other (See Comments)    Triggered bipolar, caused depression, and suicidal thoughts   Lupron [Leuprolide] Anaphylaxis   Orilissa  [Elagolix] Anaphylaxis   Penicillins Anaphylaxis, Swelling and Other (See Comments)    Potential for anaphylaxis confirmed. Tolerates Cefphalosporins No -cillins!!!! Did it involve swelling of the face/tongue/throat, SOB, or low BP? Yes Did it involve sudden or severe rash/hives, skin peeling, or any reaction on the inside of your mouth or nose? Unknown Did you need to seek medical attention at a hospital or doctor's office? Unknown When did it last happen? teenager      If all above answers are NO, may proceed with cephalosporin use.   Wound Dressing Adhesive Rash   Betadine  [  Povidone-Iodine ] Swelling and Rash   Chlorhexidine  Hives    DuraPrep    Doxycycline Itching, Nausea And Vomiting and Other (See Comments)    Caused high fever, also   Mixed Ragweed Other (See Comments)    Unknown, Mother and pt state she can't have    Olanzapine Other (See Comments)    Caused mania   Other Other (See Comments)    Stimulants, mood stabilizers, and antidepressants = Triggered bipolar, caused depression, and suicidal thoughts   Prozac [Fluoxetine Hcl] Other (See Comments)    Triggered bipolar, caused depression, and suicidal thoughts   Stevia Glycerite Extract [Flavoring Agent (Non-Screening)] Other (See Comments)    Unknown reaction; mother and pt confirm any Artificial Sweeteners cause reaction   Tape Other (See Comments)    Paper tape is tolerated   Triple Antibiotic Pain Relief [Neomy-Bacit-Polymyx-Pramoxine] Hives   Reglan  [Metoclopramide ] Anxiety and Other (See Comments)    Triggered bipolar, caused depression, suicidal thoughts, muscle twitching, stiffness, and high anxiety     I reviewed her past medical history, social history, family history, and environmental history and no significant changes have been reported from her previous visit.  Review of Systems  Constitutional:  Negative for appetite change, chills, fever and unexpected weight change.  HENT:  Negative for congestion and rhinorrhea.   Eyes:  Negative for itching.  Respiratory:  Negative for cough, chest tightness, shortness of breath and wheezing.   Cardiovascular:  Negative for chest pain.  Genitourinary:  Negative for difficulty urinating.  Skin:  Positive for rash.  Allergic/Immunologic: Positive for environmental allergies.  Neurological:  Positive for dizziness and light-headedness.    Objective: LMP  (LMP Unknown)  There is no height or weight on file to calculate BMI. Physical Exam Vitals and nursing note reviewed.  Constitutional:      Appearance: Normal appearance. She is well-developed.  HENT:     Head: Normocephalic and atraumatic.     Right Ear: Tympanic membrane and external ear normal.     Left Ear: Tympanic membrane and external ear normal.     Nose: Nose normal.     Mouth/Throat:     Mouth: Mucous membranes are moist.     Pharynx: Oropharynx is clear.  Eyes:     Conjunctiva/sclera: Conjunctivae normal.  Cardiovascular:     Rate and Rhythm: Normal rate and regular rhythm.     Heart sounds: Normal heart sounds. No murmur heard.    No friction rub. No gallop.  Pulmonary:     Effort: Pulmonary effort is normal.     Breath sounds: Normal breath sounds. No wheezing, rhonchi or rales.  Musculoskeletal:     Cervical back: Neck supple.  Skin:    General: Skin is warm.     Findings: Rash present.     Comments: Rash with petechial spots on right lateral ankle area.   Neurological:     Mental Status: She is alert and oriented to person, place, and time.  Psychiatric:        Behavior: Behavior normal.    Previous notes and tests were reviewed. The plan was reviewed  with the patient/family, and all questions/concerned were addressed.  It was my pleasure to see Rachel [name] today and participate in her care. Please feel free to contact me with any questions or concerns.  Sincerely,  Orlan Cramp, DO Allergy  & Immunology  [facility]  [place] office: [phone] [facility] office: [phone]

## 2024-06-25 ENCOUNTER — Encounter: Payer: Self-pay | Admitting: Allergy

## 2024-06-25 ENCOUNTER — Other Ambulatory Visit: Payer: Self-pay

## 2024-06-25 ENCOUNTER — Ambulatory Visit: Admitting: Allergy

## 2024-06-25 VITALS — BP 100/80 | HR 60 | Temp 97.1°F

## 2024-06-25 DIAGNOSIS — B999 Unspecified infectious disease: Secondary | ICD-10-CM | POA: Diagnosis not present

## 2024-06-25 DIAGNOSIS — J3089 Other allergic rhinitis: Secondary | ICD-10-CM | POA: Diagnosis not present

## 2024-06-25 DIAGNOSIS — J4520 Mild intermittent asthma, uncomplicated: Secondary | ICD-10-CM | POA: Diagnosis not present

## 2024-06-25 DIAGNOSIS — D4709 Other mast cell neoplasms of uncertain behavior: Secondary | ICD-10-CM

## 2024-06-25 DIAGNOSIS — R21 Rash and other nonspecific skin eruption: Secondary | ICD-10-CM | POA: Diagnosis not present

## 2024-06-25 DIAGNOSIS — L299 Pruritus, unspecified: Secondary | ICD-10-CM

## 2024-06-25 DIAGNOSIS — Z889 Allergy status to unspecified drugs, medicaments and biological substances status: Secondary | ICD-10-CM

## 2024-06-25 DIAGNOSIS — T781XXD Other adverse food reactions, not elsewhere classified, subsequent encounter: Secondary | ICD-10-CM

## 2024-06-25 DIAGNOSIS — Z91038 Other insect allergy status: Secondary | ICD-10-CM

## 2024-06-25 DIAGNOSIS — T7840XD Allergy, unspecified, subsequent encounter: Secondary | ICD-10-CM

## 2024-06-25 MED ORDER — ALBUTEROL SULFATE (2.5 MG/3ML) 0.083% IN NEBU: 2.5000 mg | INHALATION_SOLUTION | RESPIRATORY_TRACT | 1 refills | Status: AC | PRN

## 2024-06-25 MED ORDER — ALBUTEROL SULFATE HFA 108 (90 BASE) MCG/ACT IN AERS: 2.0000 | INHALATION_SPRAY | RESPIRATORY_TRACT | 1 refills | Status: AC | PRN

## 2024-06-25 NOTE — Patient Instructions (Addendum)
 Skin/hives/swelling Continue proper skin care. Keep track of outbreaks and take pictures. Continue allegra  2 tablets twice a day as needed.  Continue famotidine  20mg  twice a day.  If symptoms are not controlled or causes drowsiness let us  know. Avoid the following potential triggers: alcohol, tight clothing, NSAIDs, hot showers and getting overheated. May take hydroxyzine  10mg  every 8 hours as needed for itching.  Use triamcinolone  0.1% ointment twice a day as needed for rash flares. Do not use on the face, neck, armpits or groin area. Do not use more than 3 weeks in a row.  Get urine test.   Environmental allergies 2024 skin testing positive to mold, cat and dog. Continue environmental control measures as below. Nasal saline spray (i.e., Simply Saline) or nasal saline lavage (i.e., NeilMed) is recommended as needed and prior to medicated nasal sprays. Use cromolyn  4% 1 drop in each eye up to four times a day as needed for itchy/watery eyes.  Wait 10-15 min before putting your contact lens in.  Hold off allergy  shots for now.   Stinging insect 2025 labs negative to stinging insect panel and fire  ant.  Continue to avoid.  Asthma During respiratory infections/flares:  Start Symbicort  80mcg 2 puffs twice a day and rinse mouth after each use for 1-2 weeks until your breathing symptoms return to baseline.  Pretreat with albuterol  2 puffs or albuterol  nebulizer.  If you need to use your albuterol  nebulizer machine back to back within 15-30 minutes with no relief then please go to the ER/urgent care for further evaluation.  May use albuterol  rescue inhaler 2 puffs or nebulizer every 4 to 6 hours as needed for shortness of breath, chest tightness, coughing, and wheezing. May use albuterol  rescue inhaler 2 puffs 5 to 15 minutes prior to strenuous physical activities. Monitor frequency of use - if you need to use it more than twice per week on a consistent basis let us  know.  Breathing control  goals:  Full participation in all desired activities (may need albuterol  before activity) Albuterol  use two times or less a week on average (not counting use with activity) Cough interfering with sleep two times or less a month Oral steroids no more than once a year No hospitalizations  Allergic reactions Keep track of symptoms and episodes.  For mild symptoms you can take over the counter antihistamines (zyrtec  10mg  to 20mg ) and monitor symptoms closely.  If symptoms worsen or if you have severe symptoms including breathing issues, throat closure, significant swelling, whole body hives, severe diarrhea and vomiting, lightheadedness then use epinephrine  and seek immediate medical care afterwards. Emergency action plan in place.  Consider Xolair injections if more frequent reactions.  Food Avoid pork.  Multiple drug allergies Avoid drugs on allergy  list.  Recurrent infections Keep track of infections and antibiotics use.  Follow up in 6 months or sooner if needed.    Keep appointment with rheumatology.  Keep follow up with cardiology.

## 2024-06-26 ENCOUNTER — Ambulatory Visit: Admitting: Clinical

## 2024-06-26 DIAGNOSIS — Z658 Other specified problems related to psychosocial circumstances: Secondary | ICD-10-CM

## 2024-06-26 DIAGNOSIS — F4310 Post-traumatic stress disorder, unspecified: Secondary | ICD-10-CM | POA: Diagnosis not present

## 2024-06-26 DIAGNOSIS — F909 Attention-deficit hyperactivity disorder, unspecified type: Secondary | ICD-10-CM

## 2024-06-26 NOTE — BH Specialist Note (Signed)
 Integrated Behavioral Health via Telemedicine Visit  06/27/2024 Rachel [name] [id number]  Number of Integrated Behavioral Health Clinician visits: Additional Visit  Session Start time: 1114   Session End time: 1115  Total time in minutes: 59  Referring Provider: Jerilynn Buddle, MD Patient/Family location: Home [facility] Provider location: Center for [facility] at [facility]  All persons participating in visit: [name] and [facility] Massai Hankerson   Types of Service: Individual psychotherapy and Telephone visit  I connected with Rachel [name] [name]'s n/a via  Telephone or Video Enabled Telemedicine Application  (Video is Caregility application) and verified that I am speaking with the correct person using two identifiers. Discussed confidentiality: Yes   I discussed the limitations of telemedicine and the availability of in person appointments.  Discussed there is a possibility of technology failure and discussed alternative modes of communication if that failure occurs.  I discussed that engaging in this telemedicine visit, they consent to the provision of behavioral healthcare and the services will be billed under their insurance.  Patient and/or legal guardian expressed understanding and consented to Telemedicine visit: Yes   Presenting Concerns: Patient and/or family reports the following symptoms/concerns: Ongoing anxious thoughts and feeling misunderstood regarding chronic health conditions and feeling unsafe regarding political climate; financial stress.  Duration of problem: Ongoing; Severity of problem: moderate  Patient and/or Family's Strengths/Protective Factors: Social connections and Sense of purpose  Goals Addressed: Patient will:  Reduce symptoms of: anxiety, depression, and stress    Demonstrate ability to: Increase healthy adjustment to current life circumstances and  Increase motivation to adhere to plan of care  Progress towards Goals: Ongoing    Interventions: Interventions utilized:  Motivational Interviewing and Supportive Reflection Standardized Assessments completed: Not Needed   Patient and/or Family Response: Patient agrees with treatment plan.   Clinical Assessment/Diagnosis  Post traumatic stress disorder (PTSD)  Attention deficit hyperactivity disorder (ADHD), unspecified ADHD type  Psychosocial stressors    Patient may benefit from continued therapeutic intervention. .  Plan: Follow up with behavioral health clinician on : Two weeks Behavioral recommendations:  -Continue following medical recommendations; attending all upcoming medical appointments -Continue using daily self-coping strategies to help manage negative thoughts and emotions, with focus on things within personal realm of control; time with supportive people in life  Referral(s): Integrated [employer] (In Clinic)  I discussed the assessment and treatment plan with the patient and/or parent/guardian. They were provided an opportunity to ask questions and all were answered. They agreed with the plan and demonstrated an understanding of the instructions.   They were advised to call back or seek an in-person evaluation if the symptoms worsen or if the condition fails to improve as anticipated.  Warren BROCKS Mose Colaizzi, LCSW     01/23/2024    9:08 AM 04/23/2023    8:50 AM 11/02/2022    9:23 AM 08/02/2022    2:20 PM 08/02/2022    2:11 PM  Depression screen PHQ 2/9  Decreased Interest 2 0 0 0 0  Down, Depressed, Hopeless 2 1 1  0 0  PHQ - 2 Score 4 1 1  0 0  Altered sleeping 3 2 1 2    Tired, decreased energy 3 2 1 2    Change in appetite 3 2 1 1    Feeling bad or failure about yourself  0 0 0 0   Trouble concentrating 2 1 0 1   Moving slowly or fidgety/restless 0 0 0 0  Suicidal thoughts 0 0 0 0   PHQ-9 Score 15 8 4 6        01/23/2024    9:10 AM  04/23/2023    8:51 AM 11/02/2022    9:24 AM 08/02/2022    2:20 PM  GAD 7 : Generalized Anxiety Score  Nervous, Anxious, on Edge 2 2 1  0  Control/stop worrying 2 1 0 0  Worry too much - different things 2 0 0 0  Trouble relaxing 0 0 1 1  Restless 0 0 0 0  Easily annoyed or irritable 0 0 0 0  Afraid - awful might happen 3 0 0 0  Total GAD 7 Score 9 3 2  1

## 2024-07-09 NOTE — BH Specialist Note (Unsigned)
 Integrated Behavioral Health via Telemedicine Visit  07/10/2024 Rachel [name] [id number]  Number of Integrated Behavioral Health Clinician visits: Additional Visit  Session Start time: 1052   Session End time: 1200  Total time in minutes: 68  Referring Provider: Jerilynn Buddle, MD Patient/Family location: Home [facility] Provider location: Center for [facility] Healthcare at [facility]  All persons participating in visit: [name] and [name]   Types of Service: Individual psychotherapy and Telephone visit  I connected with Rachel [name] [name]'s n/a via  Telephone or Video Enabled Telemedicine Application  (Video is Caregility application) and verified that I am speaking with the correct person using two identifiers. Discussed confidentiality: Yes   I discussed the limitations of telemedicine and the availability of in person appointments.  Discussed there is a possibility of technology failure and discussed alternative modes of communication if that failure occurs.  I discussed that engaging in this telemedicine visit, they consent to the provision of behavioral healthcare and the services will be billed under their insurance.  Patient and/or legal guardian expressed understanding and consented to Telemedicine visit: Yes   Presenting Concerns: Patient and/or family reports the following symptoms/concerns: Mixed emotions regarding current living situation and personal relationships; copes best by verbal processing.  Duration of problem: Ongoing; Severity of problem: moderate  Patient and/or Family's Strengths/Protective Factors: Social connections and Sense of purpose  Goals Addressed: Patient will:  Reduce symptoms of: anxiety, depression, and stress   Increase knowledge and/or ability of: stress reduction   Demonstrate ability to: Increase healthy adjustment to current life  circumstances  Progress towards Goals: Ongoing    Interventions: Interventions utilized:  Supportive Reflection Standardized Assessments completed: Not Needed    Patient and/or Family Response: Patient agrees with treatment plan.   Clinical Assessment/Diagnosis  Post traumatic stress disorder (PTSD)  Attention deficit hyperactivity disorder (ADHD), unspecified ADHD type  Psychosocial stressors    Patient may benefit from continued therapeutic intervention.  Plan: Follow up with behavioral health clinician on : Two weeks Behavioral recommendations:  -Continue using daily self-coping strategies as needed -Continue following medical recommendations and attending upcoming medical appointments  Referral(s): Integrated [employer] (In Clinic)  I discussed the assessment and treatment plan with the patient and/or parent/guardian. They were provided an opportunity to ask questions and all were answered. They agreed with the plan and demonstrated an understanding of the instructions.   They were advised to call back or seek an in-person evaluation if the symptoms worsen or if the condition fails to improve as anticipated.  [name], LCSW     01/23/2024    9:08 AM 04/23/2023    8:50 AM 11/02/2022    9:23 AM 08/02/2022    2:20 PM 08/02/2022    2:11 PM  Depression screen PHQ 2/9  Decreased Interest 2 0 0 0 0  Down, Depressed, Hopeless 2 1 1  0 0  PHQ - 2 Score 4 1 1  0 0  Altered sleeping 3 2 1 2    Tired, decreased energy 3 2 1 2    Change in appetite 3 2 1 1    Feeling bad or failure about yourself  0 0 0 0   Trouble concentrating 2 1 0 1   Moving slowly or fidgety/restless 0 0 0 0   Suicidal thoughts 0 0 0 0   PHQ-9 Score 15 8 4 6        01/23/2024    9:10 AM 04/23/2023  8:51 AM 11/02/2022    9:24 AM 08/02/2022    2:20 PM  GAD 7 : Generalized Anxiety Score  Nervous, Anxious, on Edge 2 2 1  0  Control/stop worrying 2 1 0 0  Worry too much -  different things 2 0 0 0  Trouble relaxing 0 0 1 1  Restless 0 0 0 0  Easily annoyed or irritable 0 0 0 0  Afraid - awful might happen 3 0 0 0  Total GAD 7 Score 9 3 2  1

## 2024-07-10 ENCOUNTER — Ambulatory Visit: Admitting: Clinical

## 2024-07-10 DIAGNOSIS — F4310 Post-traumatic stress disorder, unspecified: Secondary | ICD-10-CM

## 2024-07-10 DIAGNOSIS — Z658 Other specified problems related to psychosocial circumstances: Secondary | ICD-10-CM

## 2024-07-10 DIAGNOSIS — F909 Attention-deficit hyperactivity disorder, unspecified type: Secondary | ICD-10-CM

## 2024-07-10 LAB — OTHER LAB TEST

## 2024-07-17 LAB — OTHER LAB TEST

## 2024-07-18 LAB — N-METHYLHISTAMINE, 24 HR, U
Collection Duration (h): 24 h
Creatinine Concent. 24 Hr, U: 150 mg/dL
Creatinine, 24 Hour, U: 825 mg/(24.h) (ref 603–1783)
N-Methylhistamine, 24 Hr, U: 142 ug/g{creat} (ref 30–200)
Urine Volume (mL): 550 mL

## 2024-07-24 ENCOUNTER — Ambulatory Visit (INDEPENDENT_AMBULATORY_CARE_PROVIDER_SITE_OTHER): Admitting: Clinical

## 2024-07-24 DIAGNOSIS — Z658 Other specified problems related to psychosocial circumstances: Secondary | ICD-10-CM

## 2024-07-24 DIAGNOSIS — F4310 Post-traumatic stress disorder, unspecified: Secondary | ICD-10-CM | POA: Diagnosis not present

## 2024-07-24 DIAGNOSIS — F909 Attention-deficit hyperactivity disorder, unspecified type: Secondary | ICD-10-CM

## 2024-07-24 NOTE — BH Specialist Note (Signed)
 Integrated Behavioral Health via Telemedicine Visit  07/24/2024 Rachel [name] [id number]  Number of Integrated Behavioral Health Clinician visits: Additional Visit  Session Start time: 1047   Session End time: 1210  Total time in minutes: 83  Referring Provider: Jerilynn Buddle, MD Patient/Family location: Home [facility] Provider location: Center for [facility] at [facility] for [facility]  All persons participating in visit: [name] and [facility] Rachel [name]   Types of Service: Individual psychotherapy and Telephone visit  I connected with Rachel [name] [name]'s n/a via  Telephone or Video Enabled Telemedicine Application  (Video is Caregility application) and verified that I am speaking with the correct person using two identifiers. Discussed confidentiality: Yes   I discussed the limitations of telemedicine and the availability of in person appointments.  Discussed there is a possibility of technology failure and discussed alternative modes of communication if that failure occurs.  I discussed that engaging in this telemedicine visit, they consent to the provision of behavioral healthcare and the services will be billed under their insurance.  Patient and/or legal guardian expressed understanding and consented to Telemedicine visit: Yes   Presenting Concerns: Patient and/or family reports the following symptoms/concerns: PTSD really triggered out of nowhere; helps to talk through thoughts and emotions that are coming up.  Duration of problem: Ongoing; Severity of problem: moderate  Patient and/or Family's Strengths/Protective Factors: Social connections and Sense of purpose  Goals Addressed: Patient will:  Reduce symptoms of: anxiety, depression, and stress   Increase knowledge and/or ability of: stress reduction   Demonstrate ability to: Increase healthy adjustment to current life circumstances  and Increase motivation to adhere to plan of care  Progress towards Goals: Ongoing  Interventions: Interventions utilized:  Motivational Interviewing and Supportive Reflection Standardized Assessments completed: Not Needed  Patient and/or Family Response: Patient agrees with treatment plan.  Clinical Assessment/Diagnosis  Post traumatic stress disorder (PTSD)  Attention deficit hyperactivity disorder (ADHD), unspecified ADHD type  Psychosocial stressors    Patient may benefit from continued therapeutic intervention   Plan: Follow up with behavioral health clinician on : Two weeks Behavioral recommendations:  -Continue daily self-coping strategies and following medical recommendations/appointments -Continue spending time with supportive people in life, including time away from home for the next few days with friends Referral(s): Integrated [employer] (In Clinic)  I discussed the assessment and treatment plan with the patient and/or parent/guardian. They were provided an opportunity to ask questions and all were answered. They agreed with the plan and demonstrated an understanding of the instructions.   They were advised to call back or seek an in-person evaluation if the symptoms worsen or if the condition fails to improve as anticipated.  [name], LCSW     01/23/2024    9:08 AM 04/23/2023    8:50 AM 11/02/2022    9:23 AM 08/02/2022    2:20 PM 08/02/2022    2:11 PM  Depression screen PHQ 2/9  Decreased Interest 2 0 0 0 0  Down, Depressed, Hopeless 2 1 1  0 0  PHQ - 2 Score 4 1 1  0 0  Altered sleeping 3 2 1 2    Tired, decreased energy 3 2 1 2    Change in appetite 3 2 1 1    Feeling bad or failure about yourself  0 0 0 0   Trouble concentrating 2 1 0 1   Moving slowly or fidgety/restless 0 0 0 0   Suicidal thoughts 0 0 0  0   PHQ-9 Score 15 8 4 6        01/23/2024    9:10 AM 04/23/2023    8:51 AM 11/02/2022    9:24 AM 08/02/2022    2:20 PM  GAD 7  : Generalized Anxiety Score  Nervous, Anxious, on Edge 2 2 1  0  Control/stop worrying 2 1 0 0  Worry too much - different things 2 0 0 0  Trouble relaxing 0 0 1 1  Restless 0 0 0 0  Easily annoyed or irritable 0 0 0 0  Afraid - awful might happen 3 0 0 0  Total GAD 7 Score 9 3 2  1

## 2024-07-25 ENCOUNTER — Ambulatory Visit: Payer: Self-pay | Admitting: Allergy

## 2024-07-25 DIAGNOSIS — D4709 Other mast cell neoplasms of uncertain behavior: Secondary | ICD-10-CM

## 2024-07-28 ENCOUNTER — Encounter: Payer: Self-pay | Admitting: Family Medicine

## 2024-07-28 ENCOUNTER — Ambulatory Visit: Attending: Family Medicine | Admitting: Family Medicine

## 2024-07-28 VITALS — BP 122/86 | HR 69 | Ht 62.0 in | Wt 110.2 lb

## 2024-07-28 DIAGNOSIS — E876 Hypokalemia: Secondary | ICD-10-CM

## 2024-07-28 DIAGNOSIS — R21 Rash and other nonspecific skin eruption: Secondary | ICD-10-CM

## 2024-07-28 DIAGNOSIS — Z23 Encounter for immunization: Secondary | ICD-10-CM

## 2024-07-28 DIAGNOSIS — G901 Familial dysautonomia [Riley-Day]: Secondary | ICD-10-CM | POA: Diagnosis not present

## 2024-07-28 DIAGNOSIS — Z131 Encounter for screening for diabetes mellitus: Secondary | ICD-10-CM | POA: Diagnosis not present

## 2024-07-28 DIAGNOSIS — K3184 Gastroparesis: Secondary | ICD-10-CM

## 2024-07-28 DIAGNOSIS — Z79899 Other long term (current) drug therapy: Secondary | ICD-10-CM

## 2024-07-28 NOTE — Patient Instructions (Addendum)
 Please call GI for your appointment as they have been trying to reach you: Placed in [facility] [address] [place], [place] [address] PH# [phone]    Sarna lotion can be used for itching.

## 2024-07-28 NOTE — Progress Notes (Signed)
 Subjective:  Patient ID: Rachel [name], female    DOB: [birth year]  Age: 38 y.o. MRN: [id number]  CC: Medical Management of Chronic Issues (Rash on body/Discuss heart rate when working out.)     Discussed the use of AI scribe software for clinical note transcription with the patient, who gave verbal consent to proceed.  History of Present Illness Genelda Roark Kiarah Eckstein is a 38 year old female with a history of bipolar disorder, anxiety, depression, PTSD, GERD, IBS with constipation, Migraines, history of laparoscopic TAHBSO due to abnormal uterine bleed currently on Premarin  by GYN), laparoscopic gastric pexy in 01/2023 secondary to presence of volvulus at [facility]. who presents with a widespread itchy rash.  She experiences a persistent, recurrent itchy rash on her chest, forehead, belly, legs, and feet. The rash appears spontaneously at least once or twice a week, resolves, and then reappears. It is intensely pruritic and sometimes forms vesicles. Current management includes Allegra , hydroxyzine , famotidine , and triamcinolone  cream prescribed by her allergist.   She has a positive antinuclear antibody (ANA) with a speckled pattern and is awaiting a rheumatology appointment in January. Her allergist suspects the rash may be autoimmune-related based on her blood work.  She was recently diagnosed with dysautonomia, experiencing episodes of bradycardia and hypotension, particularly when standing or sitting. She also has Raynaud's phenomenon, with facial pallor and purplish discoloration of the lower limbs. She uses compression garments and engages in light exercise. She had a visit with cardiology Dr. Jeffrie 3 months ago for evaluation of this.  She has Ehlers-Danlos syndrome and experiences hypermobility of her joints.  She has gastroparesis and is seeking a referral back to gastroenterology, as she has not been in contact with them since a referral was  placed in March.    Past Medical History:  Diagnosis Date   ADHD (attention deficit hyperactivity disorder)    Anemia    Anxiety    Asthma    Bipolar disorder (HCC)    Complication of anesthesia    slow to wake up    Delayed gastric emptying 02/23/2020   Depression    DUB (dysfunctional uterine bleeding) 02/23/2020   Eczema    Ehlers-Danlos syndrome    Endometriosis    Gastroparesis    GERD (gastroesophageal reflux disease)    Heart murmur    Hiatal hernia 04/06/2020   Myalgia 04/06/2020   Nausea & vomiting 09/22/2022   Parasitic infection 02/23/2020   Pneumonia    Poor appetite 02/23/2020   Recurrent upper respiratory infection (URI)    RUQ pain 02/23/2020   Vitamin D  deficiency    Weight loss 02/23/2020    Past Surgical History:  Procedure Laterality Date   barium study     COLONOSCOPY     CYSTOSCOPY N/A 07/12/2021   Procedure: CYSTOSCOPY;  Surgeon: Zina Jerilynn LABOR, MD;  Location: [facility] OR;  Service: Gynecology;  Laterality: N/A;   ESOPHAGOGASTRODUODENOSCOPY  2011   and a ph study as well.    LAPAROSCOPIC GASTRIC RESECTION N/A 01/22/2023   Procedure: LAPAROSCOPIC GASTRIC PEXY;  Surgeon: Lyndel Deward PARAS, MD;  Location: [facility] ORS;  Service: General;  Laterality: N/A;   LAPAROSCOPY N/A 01/26/2021   Procedure: LAPAROSCOPY DIAGNOSTIC;  Surgeon: Zina Jerilynn LABOR, MD;  Location: [facility];  Service: Gynecology;  Laterality: N/A;   NASAL SEPTUM SURGERY     SINOSCOPY     TONGUE FLAP     TOTAL LAPAROSCOPIC HYSTERECTOMY WITH SALPINGECTOMY Bilateral 07/12/2021  Procedure: TOTAL LAPAROSCOPIC HYSTERECTOMY WITH SALPINGECTOMY, OOPHORECTOMY;  Surgeon: Zina Jerilynn LABOR, MD;  Location: [facility] OR;  Service: Gynecology;  Laterality: Bilateral;   UPPER GASTROINTESTINAL ENDOSCOPY     WISDOM TOOTH EXTRACTION      Family History  Problem Relation Age of Onset   Eczema Mother    Urticaria Mother    Allergic rhinitis Mother    Colon polyps Mother    Depression Mother     Anxiety disorder Mother    Hypertension Mother    Hyperlipidemia Mother    Asthma Mother    Endometriosis Mother    Fibroids Mother    Fibromyalgia Mother    Migraines Mother    Irritable bowel syndrome Mother    Allergic rhinitis Father    Depression Father    Hypertension Father    Hyperlipidemia Father    Bipolar disorder Father    Urticaria Sister    Allergic rhinitis Sister    Bipolar disorder Sister    ADD / ADHD Sister    Asthma Sister    Endometriosis Sister    Seizures Sister    Bipolar disorder Sister    Anxiety disorder Sister    Heart disease Maternal Grandmother    Endometriosis Maternal Grandmother    Fibroids Maternal Grandmother    Hyperlipidemia Maternal Grandmother    Hypertension Maternal Grandmother    Diabetes Maternal Grandmother    Fibromyalgia Maternal Grandmother    Heart disease Maternal Grandfather    Diabetes Maternal Grandfather    Heart disease Paternal Grandmother    Heart disease Paternal Grandfather    Colon cancer Maternal Great-grandmother    Pancreatic cancer Neg Hx    Esophageal cancer Neg Hx    Stomach cancer Neg Hx     Social History   Socioeconomic History   Marital status: Divorced    Spouse name: Not on file   Number of children: Not on file   Years of education: Not on file   Highest education level: Not on file  Occupational History   Not on file  Tobacco Use   Smoking status: Never    Passive exposure: Never   Smokeless tobacco: Never  Vaping Use   Vaping status: Never Used  Substance and Sexual Activity   Alcohol use: Yes    Alcohol/week: 1.0 standard drink of alcohol    Types: 1 Glasses of wine per week    Comment: 1 glass of wine every other week   Drug use: No   Sexual activity: Not Currently  Other Topics Concern   Not on file  Social History Narrative   Not on file   Social Drivers of Health   Financial Resource Strain: Not on file  Food Insecurity: No Food Insecurity (01/22/2023)   Hunger  Vital Sign    Worried About Running Out of Food in the Last Year: Never true    Ran Out of Food in the Last Year: Never true  Transportation Needs: No Transportation Needs (01/22/2023)   PRAPARE - Administrator, Civil Service (Medical): No    Lack of Transportation (Non-Medical): No  Physical Activity: Not on file  Stress: Not on file  Social Connections: Not on file    Allergies  Allergen Reactions   Amitriptyline  Anaphylaxis   Amphetamine-Dextroamphetamine Other (See Comments)    Triggered bipolar, caused depression, and suicidal thoughts   Lupron [Leuprolide] Anaphylaxis   Orilissa  [Elagolix] Anaphylaxis   Penicillins Anaphylaxis, Swelling and Other (See Comments)  Potential for anaphylaxis confirmed. Tolerates Cefphalosporins No -cillins!!!! Did it involve swelling of the face/tongue/throat, SOB, or low BP? Yes Did it involve sudden or severe rash/hives, skin peeling, or any reaction on the inside of your mouth or nose? Unknown Did you need to seek medical attention at a hospital or doctor's office? Unknown When did it last happen? teenager      If all above answers are NO, may proceed with cephalosporin use.   Wound Dressing Adhesive Rash   Betadine  [Povidone-Iodine ] Swelling and Rash   Chlorhexidine  Hives    DuraPrep    Doxycycline Itching, Nausea And Vomiting and Other (See Comments)    Caused high fever, also   Mixed Ragweed Other (See Comments)    Unknown, Mother and pt state she can't have    Olanzapine Other (See Comments)    Caused mania   Other Other (See Comments)    Stimulants, mood stabilizers, and antidepressants = Triggered bipolar, caused depression, and suicidal thoughts   Prozac [Fluoxetine Hcl] Other (See Comments)    Triggered bipolar, caused depression, and suicidal thoughts   Stevia Glycerite Extract [Flavoring Agent (Non-Screening)] Other (See Comments)    Unknown reaction; mother and pt confirm any Artificial Sweeteners cause  reaction   Tape Other (See Comments)    Paper tape is tolerated   Triple Antibiotic Pain Relief [Neomy-Bacit-Polymyx-Pramoxine] Hives   Reglan  [Metoclopramide ] Anxiety and Other (See Comments)    Triggered bipolar, caused depression, suicidal thoughts, muscle twitching, stiffness, and high anxiety     Outpatient Medications Prior to Visit  Medication Sig Dispense Refill   acetaminophen  (TYLENOL ) 500 MG tablet Take 500-1,000 mg by mouth every 6 (six) hours as needed for moderate pain or mild pain.     albuterol  (PROVENTIL ) (2.5 MG/3ML) 0.083% nebulizer solution Take 3 mLs (2.5 mg total) by nebulization every 4 (four) hours as needed for wheezing or shortness of breath (coughing fits). 75 mL 1   albuterol  (VENTOLIN  HFA) 108 (90 Base) MCG/ACT inhaler Inhale 2 puffs into the lungs every 4 (four) hours as needed for wheezing or shortness of breath (coughing fits). 18 g 1   albuterol  (VENTOLIN  HFA) 108 (90 Base) MCG/ACT inhaler Inhale 2 puffs into the lungs every 4 (four) hours as needed for wheezing or shortness of breath (coughing fits). 18 g 1   atorvastatin  (LIPITOR) 20 MG tablet Take 1 tablet (20 mg total) by mouth daily. 90 tablet 1   budesonide -formoterol  (SYMBICORT ) 80-4.5 MCG/ACT inhaler Inhale 2 puffs into the lungs in the morning and at bedtime. Rinse mouth after each use. Use for 1-2 weeks during asthma flares. 1 each 3   Calcium -Magnesium-Vitamin D  600-40-500 MG-MG-UNIT TB24 Take 1 tablet by mouth every evening.     cromolyn  (OPTICROM ) 4 % ophthalmic solution Place 1 drop into both eyes 4 (four) times daily as needed (itchy/watery eyes). 10 mL 3   EPINEPHRINE  0.3 mg/0.3 mL IJ SOAJ injection INJECT INTO THE MIDDLE OF THE OUTER THIGH AND HOLD FOR 3 SECONDS AS NEEDED FOR SEVERE ALLERGIC REACTION THEN CALL 911 IF USED. 2 each 1   famotidine  (PEPCID ) 20 MG tablet TAKE 1 TABLET BY MOUTH 2 TIMES A DAY 60 tablet 2   ferrous sulfate  (FEROSUL) 325 (65 FE) MG tablet TAKE 1 TABLET BY MOUTH DAILY 90  tablet 1   fexofenadine  (ALLEGRA  ALLERGY ) 180 MG tablet Take 2 tablets (360 mg total) by mouth in the morning and at bedtime. 120 tablet 2   hydrocortisone cream 1 % Apply 1  Application topically 2 (two) times daily.     hydrOXYzine  (ATARAX ) 10 MG tablet Take 1 tablet (10 mg total) by mouth 3 (three) times daily as needed for itching. 60 tablet 1   ibuprofen  (ADVIL ) 200 MG tablet Take 400-800 mg by mouth every 8 (eight) hours as needed (pain.).     LINZESS  290 MCG CAPS capsule TAKE 1 CAPSULE BY MOUTH DAILY BEFORE BREAKFAST 30 capsule 3   Melatonin 10 MG TABS Take 10 mg by mouth at bedtime.     methylPREDNISolone  (MEDROL  DOSEPAK) 4 MG TBPK tablet Take as directed 1 each 0   MIRALAX  17 GM/SCOOP powder Take 17-34 g by mouth daily. Mix 17 g of powder into water  and drink once a day     PREMARIN  0.625 MG tablet TAKE 1 TABLET BY MOUTH DAILY FOR 21 DAYS. THEN DO NOT TAKE FOR 7 DAYS 30 tablet 11   Prenatal Vit-Fe Fumarate-FA (MULTIVITAMIN-PRENATAL) 27-0.8 MG TABS tablet Take 1 tablet by mouth daily at 12 noon.     promethazine  (PHENERGAN ) 12.5 MG tablet Take 1 tablet (12.5 mg total) by mouth every 8 (eight) hours as needed for nausea. 30 tablet 1   rizatriptan  (MAXALT -MLT) 10 MG disintegrating tablet May repeat in 2 hours if needed 10 tablet 5   sennosides-docusate sodium  (SENOKOT-S) 8.6-50 MG tablet Take 2-3 tablets by mouth daily as needed for constipation.     triamcinolone  ointment (KENALOG ) 0.1 % Apply 1 Application topically 2 (two) times daily as needed (rash flare). Do not use on the face, neck, armpits or groin area. Do not use more than 3 weeks in a row. 80 g 1   No facility-administered medications prior to visit.     ROS Review of Systems  Constitutional:  Negative for activity change and appetite change.  HENT:  Negative for sinus pressure and sore throat.   Respiratory:  Negative for chest tightness, shortness of breath and wheezing.   Cardiovascular:  Negative for chest pain and  palpitations.  Gastrointestinal:  Positive for abdominal pain and nausea. Negative for abdominal distention and constipation.  Genitourinary: Negative.   Musculoskeletal:        See HPI  Skin:  Positive for rash.  Psychiatric/Behavioral:  Negative for behavioral problems and dysphoric mood.     Objective:  BP 122/86   Pulse 69   Ht 5' 2 (1.575 m)   Wt 110 lb 3.2 oz (50 kg)   LMP  (LMP Unknown)   SpO2 95%   BMI 20.16 kg/m      07/28/2024    8:39 AM 06/25/2024    9:59 AM 05/11/2024    5:41 PM  BP/Weight  Systolic BP 122 100 116  Diastolic BP 86 80 85  Wt. (Lbs) 110.2    BMI 20.16 kg/m2        Physical Exam Constitutional:      Appearance: She is well-developed.  Cardiovascular:     Rate and Rhythm: Normal rate.     Heart sounds: Normal heart sounds. No murmur heard. Pulmonary:     Effort: Pulmonary effort is normal.     Breath sounds: Normal breath sounds. No wheezing or rales.  Chest:     Chest wall: No tenderness.  Abdominal:     General: Bowel sounds are normal. There is no distension.     Palpations: Abdomen is soft. There is no mass.     Tenderness: There is no abdominal tenderness.  Musculoskeletal:        General:  Normal range of motion.     Right lower leg: No edema.     Left lower leg: No edema.  Skin:    Comments: Pinpoint erythematous lesions distributed on lower extremities and dorsum of feet and presence of sparse rash on forehead and bilateral upper extremities.  No fluid noted in rash  Neurological:     Mental Status: She is alert and oriented to person, place, and time.  Psychiatric:        Mood and Affect: Mood normal.        Latest Ref Rng & Units 05/11/2024    1:20 PM 03/26/2024    9:50 AM 01/25/2024    8:50 AM  CMP  Glucose 70 - 99 mg/dL 86  69  90   BUN 6 - 20 mg/dL 12  10  9    Creatinine 0.44 - 1.00 mg/dL 9.12  9.21  9.28   Sodium 135 - 145 mmol/L 140  141  141   Potassium 3.5 - 5.1 mmol/L 3.3  4.0  4.2   Chloride 98 - 111 mmol/L  106  104  102   CO2 22 - 32 mmol/L 25  23  26    Calcium  8.9 - 10.3 mg/dL 9.8  9.8  89.8   Total Protein 6.5 - 8.1 g/dL 6.9  7.0  7.7   Total Bilirubin 0.0 - 1.2 mg/dL 0.3  0.2  0.3   Alkaline Phos 38 - 126 U/L 55  62  68   AST 15 - 41 U/L 23  24  18    ALT 0 - 44 U/L 16  19  11      Lipid Panel     Component Value Date/Time   CHOL 184 01/25/2024 0850   TRIG 101 01/25/2024 0850   HDL 86 01/25/2024 0850   LDLCALC 80 01/25/2024 0850    CBC    Component Value Date/Time   WBC 6.2 05/11/2024 1320   RBC 4.18 05/11/2024 1320   HGB 13.2 05/11/2024 1320   HGB 13.2 03/26/2024 0950   HCT 38.1 05/11/2024 1320   HCT 40.8 03/26/2024 0950   PLT 213 05/11/2024 1320   PLT 238 03/26/2024 0950   MCV 91.1 05/11/2024 1320   MCV 98 (H) 03/26/2024 0950   MCH 31.6 05/11/2024 1320   MCHC 34.6 05/11/2024 1320   RDW 12.5 05/11/2024 1320   RDW 13.1 03/26/2024 0950   LYMPHSABS 2.2 03/26/2024 0950   MONOABS 0.4 09/22/2022 1211   EOSABS 0.3 03/26/2024 0950   BASOSABS 0.1 03/26/2024 0950    No results found for: HGBA1C      Assessment & Plan Chronic recurrent pruritic rash, 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 - Continue Allegra , hydroxyzine , famotidine , and triamcinolone  cream. - Consider Sarna lotion for additional relief. - Avoids long-term use of Benadryl  per allergist recommendation - Follow up with rheumatology in January; consider waitlist for earlier appointment.  Dysautonomia Characterized by low heart rate and blood pressure, especially when standing or sitting.  - Continue light treadmill exercise and strength training. - Use compression stockings and bodysuits. - Monitor and adjust salt intake as needed. - Follow up with cardiologist as needed.  Gastroparesis Requires referral to gastroenterology for management. - Contact gastroenterology office to follow up on referral and schedule an  appointment.  Hypokalemia Previous blood work indicated low potassium levels. - Order blood test to recheck potassium levels.  General Health Maintenance Due for flu shot and diabetes screening. -  Administer flu shot. - Order A1c test to screen for diabetes.     Healthcare maintenance Screening for diabetes-A1c ordered Need for influenza vaccine-flu shot ordered  No orders of the defined types were placed in this encounter.   Follow-up: Return in about 6 months (around 01/25/2025) for Chronic medical conditions.       Corrina Sabin, MD, FAAFP. [facility] [place], [place] [phone]   07/28/2024, 2:11 PM

## 2024-07-29 ENCOUNTER — Other Ambulatory Visit: Payer: Self-pay | Admitting: Medical Genetics

## 2024-07-29 ENCOUNTER — Telehealth: Payer: Self-pay

## 2024-07-29 ENCOUNTER — Encounter: Payer: Self-pay | Admitting: Rheumatology

## 2024-07-29 ENCOUNTER — Ambulatory Visit: Attending: Rheumatology | Admitting: Rheumatology

## 2024-07-29 ENCOUNTER — Ambulatory Visit: Payer: Self-pay | Admitting: Family Medicine

## 2024-07-29 VITALS — BP 123/83 | HR 66 | Temp 98.1°F | Resp 14 | Ht 62.21 in | Wt 109.4 lb

## 2024-07-29 DIAGNOSIS — K30 Functional dyspepsia: Secondary | ICD-10-CM | POA: Insufficient documentation

## 2024-07-29 DIAGNOSIS — Z90710 Acquired absence of both cervix and uterus: Secondary | ICD-10-CM | POA: Diagnosis not present

## 2024-07-29 DIAGNOSIS — R768 Other specified abnormal immunological findings in serum: Secondary | ICD-10-CM | POA: Insufficient documentation

## 2024-07-29 DIAGNOSIS — R208 Other disturbances of skin sensation: Secondary | ICD-10-CM | POA: Diagnosis not present

## 2024-07-29 DIAGNOSIS — Z8619 Personal history of other infectious and parasitic diseases: Secondary | ICD-10-CM | POA: Diagnosis not present

## 2024-07-29 DIAGNOSIS — L2089 Other atopic dermatitis: Secondary | ICD-10-CM | POA: Diagnosis not present

## 2024-07-29 DIAGNOSIS — K582 Mixed irritable bowel syndrome: Secondary | ICD-10-CM | POA: Diagnosis not present

## 2024-07-29 DIAGNOSIS — E782 Mixed hyperlipidemia: Secondary | ICD-10-CM | POA: Diagnosis not present

## 2024-07-29 DIAGNOSIS — J3089 Other allergic rhinitis: Secondary | ICD-10-CM | POA: Insufficient documentation

## 2024-07-29 DIAGNOSIS — G8929 Other chronic pain: Secondary | ICD-10-CM | POA: Insufficient documentation

## 2024-07-29 DIAGNOSIS — M791 Myalgia, unspecified site: Secondary | ICD-10-CM | POA: Diagnosis not present

## 2024-07-29 DIAGNOSIS — D508 Other iron deficiency anemias: Secondary | ICD-10-CM | POA: Insufficient documentation

## 2024-07-29 DIAGNOSIS — F902 Attention-deficit hyperactivity disorder, combined type: Secondary | ICD-10-CM | POA: Diagnosis not present

## 2024-07-29 DIAGNOSIS — M2550 Pain in unspecified joint: Secondary | ICD-10-CM | POA: Insufficient documentation

## 2024-07-29 DIAGNOSIS — R21 Rash and other nonspecific skin eruption: Secondary | ICD-10-CM | POA: Diagnosis not present

## 2024-07-29 DIAGNOSIS — J4520 Mild intermittent asthma, uncomplicated: Secondary | ICD-10-CM | POA: Diagnosis not present

## 2024-07-29 DIAGNOSIS — K449 Diaphragmatic hernia without obstruction or gangrene: Secondary | ICD-10-CM | POA: Insufficient documentation

## 2024-07-29 DIAGNOSIS — G43709 Chronic migraine without aura, not intractable, without status migrainosus: Secondary | ICD-10-CM | POA: Insufficient documentation

## 2024-07-29 DIAGNOSIS — K219 Gastro-esophageal reflux disease without esophagitis: Secondary | ICD-10-CM | POA: Insufficient documentation

## 2024-07-29 DIAGNOSIS — R109 Unspecified abdominal pain: Secondary | ICD-10-CM | POA: Insufficient documentation

## 2024-07-29 DIAGNOSIS — Z889 Allergy status to unspecified drugs, medicaments and biological substances status: Secondary | ICD-10-CM | POA: Diagnosis not present

## 2024-07-29 DIAGNOSIS — F319 Bipolar disorder, unspecified: Secondary | ICD-10-CM | POA: Diagnosis not present

## 2024-07-29 DIAGNOSIS — B999 Unspecified infectious disease: Secondary | ICD-10-CM | POA: Diagnosis not present

## 2024-07-29 LAB — HEMOGLOBIN A1C
Est. average glucose Bld gHb Est-mCnc: 103 mg/dL
Hgb A1c MFr Bld: 5.2 % (ref 4.8–5.6)

## 2024-07-29 LAB — POTASSIUM: Potassium: 3.9 mmol/L (ref 3.5–5.2)

## 2024-07-29 NOTE — Addendum Note (Signed)
 Addended by: YVONE DAVED BROCKS on: 07/29/2024 09:38 AM   Modules accepted: Orders

## 2024-07-29 NOTE — Telephone Encounter (Signed)
 Patient was seen as a new patient today, 07/29/2024. Per Dr. Dolphus, if labs are negative, patient can be called with results.

## 2024-07-29 NOTE — Progress Notes (Deleted)
 Office Visit Note  Patient: Rachel [name]             Date of Birth: [birth year]           MRN: [id number]             PCP: Delbert Clam, MD Referring: Luke Orlan HERO, DO Visit Date: 07/29/2024 Occupation: Data Unavailable  Subjective:  No chief complaint on file.   History of Present Illness: Rachel [name] is a 38 y.o. female ***     Activities of Daily Living:  Patient reports morning stiffness for *** {minute/hour:19697}.   Patient {ACTIONS;DENIES/REPORTS:21021675::Denies} nocturnal pain.  Difficulty dressing/grooming: {ACTIONS;DENIES/REPORTS:21021675::Denies} Difficulty climbing stairs: {ACTIONS;DENIES/REPORTS:21021675::Denies} Difficulty getting out of chair: {ACTIONS;DENIES/REPORTS:21021675::Denies} Difficulty using hands for taps, buttons, cutlery, and/or writing: {ACTIONS;DENIES/REPORTS:21021675::Denies}  No Rheumatology ROS completed.   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   Memory loss 12/25/2022   Paresthesia 12/25/2022   Other allergic rhinitis 12/04/2022   Allergic conjunctivitis of both eyes 12/04/2022   Mild intermittent asthma without complication 12/04/2022   Other atopic dermatitis 12/04/2022   Allergic reaction 12/04/2022   Rash and other nonspecific skin eruption 12/04/2022   Multiple drug allergies 12/04/2022   Other adverse food reactions, not elsewhere classified, subsequent encounter 12/04/2022   Abnormal CT of the abdomen 09/23/2022   Abdominal pain 09/22/2022   Asthma, chronic 09/22/2022   HLD (hyperlipidemia) 09/22/2022   Nausea & vomiting 09/22/2022   Iron  deficiency anemia 01/20/2022   Acute hyponatremia 01/18/2022   Pyelonephritis 01/18/2022   Protein-calorie malnutrition, severe 01/18/2022   sepsis secondary to pyelonephritis of  left kidney 01/17/2022   Sepsis (HCC) 01/17/2022   Chronic abdominal pain 01/17/2022   Dyspnea 01/17/2022   Underweight 01/17/2022   Irritable bowel syndrome (IBS) 01/17/2022   Hypokalemia 01/17/2022   Vasomotor symptoms due to menopause 08/17/2021   S/P laparoscopic hysterectomy 07/12/2021   Preoperative exam for gynecologic surgery 06/20/2021   Draining postoperative wound 02/28/2021   Encounter for postoperative care 02/16/2021   Endometriosis determined by laparoscopy    Pelvic pain in female 10/11/2020   Presence of subdermal contraceptive implant 10/11/2020   Dysmenorrhea 10/11/2020   Dyspareunia in female 10/11/2020   NSVT (nonsustained ventricular tachycardia) (HCC) 09/03/2020   Gastroparesis 08/18/2020   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   Palpitations 07/26/2020   Family history of connective tissue disease 07/26/2020   Myalgia 04/06/2020   Hiatal hernia 04/06/2020   Recurrent infections 02/23/2020   Weight loss 02/23/2020   Poor appetite 02/23/2020   RUQ pain 02/23/2020   Delayed gastric emptying 02/23/2020   DUB (dysfunctional uterine bleeding) 02/23/2020   Cervical radiculopathy 05/31/2018   Hyperalgesia 05/31/2018   Muscle weakness 03/13/2018   GERD (gastroesophageal reflux disease) 12/26/2017   ADHD (attention deficit hyperactivity disorder) 05/07/2012   Bipolar 1 disorder (HCC) 05/07/2012    Past Medical History:  Diagnosis Date   ADHD (attention deficit hyperactivity disorder)    Anemia    Anxiety    Asthma    Bipolar disorder (HCC)    Complication of anesthesia    slow to wake up    Delayed gastric emptying 02/23/2020   Depression    DUB (dysfunctional uterine bleeding) 02/23/2020   Eczema    Ehlers-Danlos syndrome    Endometriosis    Gastroparesis    GERD (gastroesophageal reflux disease)  Heart murmur    Hiatal hernia 04/06/2020   Myalgia 04/06/2020   Nausea & vomiting 09/22/2022   Parasitic infection 02/23/2020   Pneumonia    Poor  appetite 02/23/2020   Recurrent upper respiratory infection (URI)    RUQ pain 02/23/2020   Vitamin D  deficiency    Weight loss 02/23/2020    Family History  Problem Relation Age of Onset   Eczema Mother    Urticaria Mother    Allergic rhinitis Mother    Colon polyps Mother    Depression Mother    Anxiety disorder Mother    Hypertension Mother    Hyperlipidemia Mother    Asthma Mother    Endometriosis Mother    Fibroids Mother    Fibromyalgia Mother    Migraines Mother    Irritable bowel syndrome Mother    Allergic rhinitis Father    Depression Father    Hypertension Father    Hyperlipidemia Father    Bipolar disorder Father    Urticaria Sister    Allergic rhinitis Sister    Bipolar disorder Sister    ADD / ADHD Sister    Asthma Sister    Endometriosis Sister    Seizures Sister    Bipolar disorder Sister    Anxiety disorder Sister    Heart disease Maternal Grandmother    Endometriosis Maternal Grandmother    Fibroids Maternal Grandmother    Hyperlipidemia Maternal Grandmother    Hypertension Maternal Grandmother    Diabetes Maternal Grandmother    Fibromyalgia Maternal Grandmother    Heart disease Maternal Grandfather    Diabetes Maternal Grandfather    Heart disease Paternal Grandmother    Heart disease Paternal Grandfather    Colon cancer Maternal Great-grandmother    Pancreatic cancer Neg Hx    Esophageal cancer Neg Hx    Stomach cancer Neg Hx    Past Surgical History:  Procedure Laterality Date   barium study     COLONOSCOPY     CYSTOSCOPY N/A 07/12/2021   Procedure: CYSTOSCOPY;  Surgeon: Zina Jerilynn LABOR, MD;  Location: [facility];  Service: Gynecology;  Laterality: N/A;   ESOPHAGOGASTRODUODENOSCOPY  2011   and a ph study as well.    LAPAROSCOPIC GASTRIC RESECTION N/A 01/22/2023   Procedure: LAPAROSCOPIC GASTRIC PEXY;  Surgeon: Lyndel Deward PARAS, MD;  Location: [facility];  Service: General;  Laterality: N/A;   LAPAROSCOPY N/A 01/26/2021   Procedure:  LAPAROSCOPY DIAGNOSTIC;  Surgeon: Zina Jerilynn LABOR, MD;  Location: [facility];  Service: Gynecology;  Laterality: N/A;   NASAL SEPTUM SURGERY     SINOSCOPY     TONGUE FLAP     TOTAL LAPAROSCOPIC HYSTERECTOMY WITH SALPINGECTOMY Bilateral 07/12/2021   Procedure: TOTAL LAPAROSCOPIC HYSTERECTOMY WITH SALPINGECTOMY, OOPHORECTOMY;  Surgeon: Zina Jerilynn LABOR, MD;  Location: [facility];  Service: Gynecology;  Laterality: Bilateral;   UPPER GASTROINTESTINAL ENDOSCOPY     WISDOM TOOTH EXTRACTION     Social History   Tobacco Use   Smoking status: Never    Passive exposure: Never   Smokeless tobacco: Never  Vaping Use   Vaping status: Never Used  Substance Use Topics   Alcohol use: Yes    Alcohol/week: 1.0 standard drink of alcohol    Types: 1 Glasses of wine per week    Comment: 1 glass of wine every other week   Drug use: No   Social History   Social History Narrative   Not on file     Immunization History  Administered Date(s) Administered   Influenza, Seasonal, Injecte, Preservative Fre 07/28/2024   Influenza,inj,Quad PF,6+ Mos 07/14/2020, 08/02/2022, 08/02/2022   PFIZER(Purple Top)SARS-COV-2 Vaccination 02/19/2020, 03/17/2020, 09/11/2020   Pfizer Covid-19 Vaccine Bivalent Booster 43yrs & up 09/15/2021   Tdap 01/07/2021     Objective: Vital Signs: LMP  (LMP Unknown)    Physical Exam   Musculoskeletal Exam: ***  CDAI Exam: CDAI Score: -- Patient Global: --; Provider Global: -- Swollen: --; Tender: -- Joint Exam 07/29/2024   No joint exam has been documented for this visit   There is currently no information documented on the homunculus. Go to the Rheumatology activity and complete the homunculus joint exam.  Investigation: No additional findings.  Imaging: No results found.  Recent Labs: Lab Results  Component Value Date   WBC 6.2 05/11/2024   HGB 13.2 05/11/2024   PLT 213 05/11/2024   NA 140 05/11/2024   K 3.9 07/28/2024   CL 106 05/11/2024    CO2 25 05/11/2024   GLUCOSE 86 05/11/2024   BUN 12 05/11/2024   CREATININE 0.87 05/11/2024   BILITOT 0.3 05/11/2024   ALKPHOS 55 05/11/2024   AST 23 05/11/2024   ALT 16 05/11/2024   PROT 6.9 05/11/2024   ALBUMIN 4.2 05/11/2024   CALCIUM  9.8 05/11/2024   GFRAA 128 02/04/2020   Mar 26, 2024 ANA 1: 320 NS, C1 esterase inhibitor normal, C3-C4 normal, sed rate 3, CRP less than 1 May 11, 2024 sed rate 7 Speciality Comments: No specialty comments available.  Procedures:  No procedures performed Allergies: Amitriptyline , Amphetamine-dextroamphetamine, Lupron [leuprolide], Orilissa  [elagolix], Penicillins, Wound dressing adhesive, Betadine  [povidone-iodine ], Chlorhexidine , Doxycycline, Mixed ragweed, Olanzapine, Other, Prozac [fluoxetine hcl], Stevia glycerite extract [flavoring agent (non-screening)], Tape, Triple antibiotic pain relief [neomy-bacit-polymyx-pramoxine], and Reglan  [metoclopramide ]   Assessment / Plan:     Visit Diagnoses: No diagnosis found.  Orders: No orders of the defined types were placed in this encounter.  No orders of the defined types were placed in this encounter.   Face-to-face time spent with patient was *** minutes. Greater than 50% of time was spent in counseling and coordination of care.  Follow-Up Instructions: No follow-ups on file.   Maya Nash, MD  Note - This record has been created using Animal nutritionist.  Chart creation errors have been sought, but may not always  have been located. Such creation errors do not reflect on  the standard of medical care.

## 2024-07-29 NOTE — Progress Notes (Addendum)
 Office Visit Note  Patient: Rachel [name]             Date of Birth: 03-26-1986           MRN: [id number]             PCP: Delbert Clam, MD Referring: Luke Orlan HERO, DO Visit Date: 07/29/2024 Occupation: Data Unavailable  Subjective:  Pain in multiple joints.   History of Present Illness: Rachel [name] is a 38 y.o. female with history of myofascial pain and hypermobility arthralgias and positive ANA.  She returns today after her last visit in September 2021.  Patient was initially seen in August 2021.  She stated that her symptoms started as a child with eating disorder.  She was diagnosed with gastroparesis in 2009.  She had extensive GI workup.  She was also diagnosed with ADHD and bipolar disorder.  She was evaluated to rule out autoimmune disease at the time.  After thorough evaluation she was diagnosed with hypermobility syndrome and myofascial pain syndrome. Patient states she was evaluated by sports medicine at the time and did not get many answers.  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  She states she developed severe endometriosis and had to undergo total hysterectomy in 2022.  She also had severe weight loss and gastroparesis.  She underwent laparoscopic gastric resection in March 2024.  She gradually started gaining some weight.  She states she has been having intermittent hives on her extremities and her trunk.  She had been under care of Dr. Orlan, allergist.  She advised Allegra  and famotidine  for rash.  She also did labs which came positive for positive ANA and this the reason she was referred to me again.  Patient states she had extensive workup and was diagnosed with dysautonomia by the cardiologist.  She also has been having recurrent urinary tract infection to the point she had to be hospitalized for sepsis.  She states she gets water  retention all over her body.  She also reports hair thinning and intermittent rash on her  face.  She gives history of Raynaud's phenomenon.  She states she has generalized pain from head to toe and all of her muscles.  She has some discomfort in her joints but no joint swelling. There is no family history of autoimmune disease.  She is right-handed, she is on disability.  She is single, gravida 0, no history of DVTs.  She drinks alcohol rarely.  She is non-smoker.    Activities of Daily Living:  Patient reports morning stiffness for 5 minutes- 2 hours. Patient Reports nocturnal pain.  Difficulty dressing/grooming: Denies Difficulty climbing stairs: Reports Difficulty getting out of chair: Reports Difficulty using hands for taps, buttons, cutlery, and/or writing: Reports  Review of Systems  Constitutional:  Positive for fatigue.  HENT:  Positive for mouth sores. Negative for mouth dryness.        Nose sores  Eyes:  Positive for itching and dryness.  Respiratory:  Positive for shortness of breath.   Cardiovascular:  Negative for chest pain and palpitations.  Gastrointestinal:  Positive for blood in stool, constipation and diarrhea.  Endocrine: Positive for cold intolerance and heat intolerance. Negative for increased urination.  Genitourinary:  Negative for involuntary urination.  Musculoskeletal:  Positive for joint pain, joint pain, myalgias, morning stiffness, muscle tenderness and myalgias. Negative for gait problem, joint swelling and muscle weakness.  Skin:  Positive for color change, rash, hair loss and sensitivity  to sunlight.  Allergic/Immunologic: Positive for susceptible to infections.  Neurological:  Positive for dizziness, fainting, numbness and headaches.  Hematological:  Negative for swollen glands.  Psychiatric/Behavioral:  Positive for sleep disturbance. Negative for depressed mood. The patient is not nervous/anxious.     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   Memory loss 12/25/2022   Paresthesia 12/25/2022   Other allergic rhinitis 12/04/2022   Allergic conjunctivitis of both eyes 12/04/2022   Mild intermittent asthma without complication 12/04/2022   Other atopic dermatitis 12/04/2022   Allergic reaction 12/04/2022   Rash and other nonspecific skin eruption 12/04/2022   Multiple drug allergies 12/04/2022   Other adverse food reactions, not elsewhere classified, subsequent encounter 12/04/2022   Abnormal CT of the abdomen 09/23/2022   Abdominal pain 09/22/2022   Asthma, chronic 09/22/2022   HLD (hyperlipidemia) 09/22/2022   Nausea & vomiting 09/22/2022   Iron  deficiency anemia 01/20/2022   Acute hyponatremia 01/18/2022   Pyelonephritis 01/18/2022   Protein-calorie malnutrition, severe 01/18/2022   sepsis secondary to pyelonephritis of left kidney 01/17/2022   Sepsis (HCC) 01/17/2022   Chronic abdominal pain 01/17/2022   Dyspnea 01/17/2022   Underweight 01/17/2022   Irritable bowel syndrome (IBS) 01/17/2022   Hypokalemia 01/17/2022   Vasomotor symptoms due to menopause 08/17/2021   S/P laparoscopic hysterectomy 07/12/2021   Preoperative exam for gynecologic surgery 06/20/2021   Draining postoperative wound 02/28/2021   Encounter for postoperative care 02/16/2021   Endometriosis determined by laparoscopy    Pelvic pain in female 10/11/2020   Presence of subdermal contraceptive implant 10/11/2020   Dysmenorrhea 10/11/2020   Dyspareunia in female 10/11/2020   NSVT (nonsustained ventricular tachycardia) (HCC) 09/03/2020   Gastroparesis 08/18/2020   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   Palpitations 07/26/2020   Family history of connective tissue disease 07/26/2020   Myalgia 04/06/2020   Hiatal hernia 04/06/2020   Recurrent infections 02/23/2020   Weight loss 02/23/2020   Poor appetite 02/23/2020   RUQ pain 02/23/2020   Delayed gastric emptying  02/23/2020   DUB (dysfunctional uterine bleeding) 02/23/2020   Cervical radiculopathy 05/31/2018   Hyperalgesia 05/31/2018   Muscle weakness 03/13/2018   GERD (gastroesophageal reflux disease) 12/26/2017   ADHD (attention deficit hyperactivity disorder) 05/07/2012   Bipolar 1 disorder (HCC) 05/07/2012    Past Medical History:  Diagnosis Date   ADHD (attention deficit hyperactivity disorder)    Anemia    Anxiety    Asthma    Bipolar disorder (HCC)    Complication of anesthesia    slow to wake up    Delayed gastric emptying 02/23/2020   Depression    DUB (dysfunctional uterine bleeding) 02/23/2020   Eczema    Ehlers-Danlos syndrome    Endometriosis    Gastroparesis    GERD (gastroesophageal reflux disease)    Heart murmur    Hiatal hernia 04/06/2020   Myalgia 04/06/2020   Nausea & vomiting 09/22/2022   Parasitic infection 02/23/2020   Pneumonia    Poor appetite 02/23/2020   Recurrent upper respiratory infection (URI)    RUQ pain 02/23/2020   Vitamin D  deficiency    Weight loss 02/23/2020    Family History  Problem Relation Age of Onset   Eczema Mother    Urticaria Mother    Allergic rhinitis Mother    Colon polyps Mother    Depression  Mother    Anxiety disorder Mother    Hypertension Mother    Hyperlipidemia Mother    Asthma Mother    Endometriosis Mother    Fibroids Mother    Fibromyalgia Mother    Migraines Mother    Irritable bowel syndrome Mother    Allergic rhinitis Father    Depression Father    Hypertension Father    Hyperlipidemia Father    Bipolar disorder Father    Urticaria Sister    Allergic rhinitis Sister    Bipolar disorder Sister    ADD / ADHD Sister    Asthma Sister    Endometriosis Sister    ADD / ADHD Sister    Seizures Sister    Anxiety disorder Sister    Heart disease Maternal Grandmother    Endometriosis Maternal Grandmother    Fibroids Maternal Grandmother    Hyperlipidemia Maternal Grandmother    Hypertension Maternal  Grandmother    Diabetes Maternal Grandmother    Fibromyalgia Maternal Grandmother    Heart disease Maternal Grandfather    Diabetes Maternal Grandfather    Heart disease Paternal Grandmother    Heart disease Paternal Grandfather    Colon cancer Maternal Great-grandmother    Pancreatic cancer Neg Hx    Esophageal cancer Neg Hx    Stomach cancer Neg Hx    Past Surgical History:  Procedure Laterality Date   barium study     COLONOSCOPY     CYSTOSCOPY N/A 07/12/2021   Procedure: CYSTOSCOPY;  Surgeon: Zina Jerilynn LABOR, MD;  Location: [facility] OR;  Service: Gynecology;  Laterality: N/A;   ESOPHAGOGASTRODUODENOSCOPY  2011   and a ph study as well.    LAPAROSCOPIC GASTRIC RESECTION N/A 01/22/2023   Procedure: LAPAROSCOPIC GASTRIC PEXY;  Surgeon: Lyndel Deward PARAS, MD;  Location: [facility] ORS;  Service: General;  Laterality: N/A;   LAPAROSCOPY N/A 01/26/2021   Procedure: LAPAROSCOPY DIAGNOSTIC;  Surgeon: Zina Jerilynn LABOR, MD;  Location: [facility];  Service: Gynecology;  Laterality: N/A;   NASAL SEPTUM SURGERY     SINOSCOPY     TONGUE FLAP     TOTAL LAPAROSCOPIC HYSTERECTOMY WITH SALPINGECTOMY Bilateral 07/12/2021   Procedure: TOTAL LAPAROSCOPIC HYSTERECTOMY WITH SALPINGECTOMY, OOPHORECTOMY;  Surgeon: Zina Jerilynn LABOR, MD;  Location: [facility] OR;  Service: Gynecology;  Laterality: Bilateral;   UPPER GASTROINTESTINAL ENDOSCOPY     WISDOM TOOTH EXTRACTION     Social History   Tobacco Use   Smoking status: Never    Passive exposure: Never   Smokeless tobacco: Never  Vaping Use   Vaping status: Never Used  Substance Use Topics   Alcohol use: Yes    Alcohol/week: 1.0 standard drink of alcohol    Types: 1 Glasses of wine per week    Comment: rare   Drug use: No   Social History   Social History Narrative   Not on file     Immunization History  Administered Date(s) Administered   Influenza, Seasonal, Injecte, Preservative Fre 07/28/2024   Influenza,inj,Quad PF,6+ Mos  07/14/2020, 08/02/2022, 08/02/2022   PFIZER(Purple Top)SARS-COV-2 Vaccination 02/19/2020, 03/17/2020, 09/11/2020   Pfizer Covid-19 Vaccine Bivalent Booster 64yrs & up 09/15/2021   Tdap 01/07/2021     Objective: Vital Signs: BP 123/83 (BP Location: Right Arm, Patient Position: Sitting, Cuff Size: Normal)   Pulse 66   Temp 98.1 F (36.7 C)   Resp 14   Ht 5' 2.21 (1.58 m)   Wt 109 lb 6.4 oz (49.6 kg)   LMP  (LMP  Unknown)   BMI 19.88 kg/m    Physical Exam Vitals and nursing note reviewed.  Constitutional:      Appearance: She is well-developed.  HENT:     Head: Normocephalic and atraumatic.  Eyes:     Conjunctiva/sclera: Conjunctivae normal.  Cardiovascular:     Rate and Rhythm: Normal rate and regular rhythm.     Heart sounds: Normal heart sounds.  Pulmonary:     Effort: Pulmonary effort is normal.     Breath sounds: Normal breath sounds.  Abdominal:     General: Bowel sounds are normal.     Palpations: Abdomen is soft.  Musculoskeletal:     Cervical back: Normal range of motion.     Comments: No edema was noted.  Lymphadenopathy:     Cervical: No cervical adenopathy.  Skin:    General: Skin is warm and dry.     Capillary Refill: Capillary refill takes less than 2 seconds.     Comments: No new back capillary changes were noted.  No Telengectesia was noted.  She had good capillary refill.  She had few papular lesions on her ankles.  Neurological:     Mental Status: She is alert and oriented to person, place, and time.  Psychiatric:        Behavior: Behavior normal.      Musculoskeletal Exam: Cervical, thoracic and lumbar spine Juengel range of motion.  Shoulders, elbow joints, wrist joints, MCPs PIPs and DIPs with good range of motion with no synovitis.  Hip joints and knee joints with good range of motion without any warmth swelling or effusion.  There was no tenderness over ankles or MTPs.  She had hypermobility all of her joints.  She also had generalized  hyperalgesia.  She had positive tender points.  CDAI Exam: CDAI Score: -- Patient Global: --; Provider Global: -- Swollen: --; Tender: -- Joint Exam 07/29/2024   No joint exam has been documented for this visit   There is currently no information documented on the homunculus. Go to the Rheumatology activity and complete the homunculus joint exam.  Investigation: No additional findings.  Imaging: No results found.  Recent Labs: Lab Results  Component Value Date   WBC 6.2 05/11/2024   HGB 13.2 05/11/2024   PLT 213 05/11/2024   NA 140 05/11/2024   K 3.9 07/28/2024   CL 106 05/11/2024   CO2 25 05/11/2024   GLUCOSE 86 05/11/2024   BUN 12 05/11/2024   CREATININE 0.87 05/11/2024   BILITOT 0.3 05/11/2024   ALKPHOS 55 05/11/2024   AST 23 05/11/2024   ALT 16 05/11/2024   PROT 6.9 05/11/2024   ALBUMIN 4.2 05/11/2024   CALCIUM  9.8 05/11/2024   GFRAA 128 02/04/2020   Mar 26, 2024 ANA 1: 320 NS, C1 esterase inhibitor normal, C3-C4 normal, sed rate 3, CRP less than 1 May 11, 2024 sed rate 7  Speciality Comments: No specialty comments available.  Procedures:  No procedures performed Allergies: Amitriptyline , Amphetamine-dextroamphetamine, Lupron [leuprolide], Orilissa  [elagolix], Penicillins, Wound dressing adhesive, Betadine  [povidone-iodine ], Chlorhexidine , Doxycycline, Mixed ragweed, Olanzapine, Other, Pork-derived products, Prozac [fluoxetine hcl], Stevia glycerite extract [flavoring agent (non-screening)], Tape, Triple antibiotic pain relief [neomy-bacit-polymyx-pramoxine], and Reglan  [metoclopramide ]   Assessment / Plan:     Visit Diagnoses: Positive ANA (antinuclear antibody) -Patient was found to have positive ANA by her allergist.  She was referred to me again.  She was last seen in the office in 2021.She gives history of fatigue, oral ulcers, dry eyes, myalgias, arthralgias.  There is questionable history of Raynaud's phenomenon.  She also gives history of hair thinning.   There is no history of photosensitivity or lymphadenopathy.  There is no history of inflammatory arthritis. Plan: Protein / creatinine ratio, urine, ANA, Anti-scleroderma antibody, RNP Antibody, Anti-Smith antibody, Sjogrens syndrome-A extractable nuclear antibody, Sjogrens syndrome-B extractable nuclear antibody, Anti-DNA antibody, double-stranded, C3 and C4, Beta-2  glycoprotein antibodies, Cardiolipin antibodies, IgG, IgM, IgA, Thyroid  peroxidase antibody, Thyroglobulin antibody.  Patient gives history of Raynaud's phenomenon.  She had good capillary refill with no nailbed capillary changes.  Myalgia-she complains of pain and discomfort in all of her muscles.  There was no muscular weakness on exam.  She was able to get up from the squatting position without any difficulty.  She had good muscle strength in all 4 extremities.  Her clinical features are suggestive of myofascial pain syndrome.  Detailed counseling was provided.  She will benefit from water  aerobics and swimming.  Patient states she had good benefit from aquatic therapy in the past.  I will refer her to water  therapy to physical therapy.  Hyperalgesia-she had generalized hyperalgesia.  She had positive tender points.  Hypermobility arthralgia-she was diagnosed with hypermobility in the past.  I will refer her to Dr. Joane for evaluation and management.  Rash-she gives history of frequent rash.  Few papular lesions were noted on her ankles.  She states she gets hives very frequently.  She was evaluated by Dr. Orlan.  I reviewed all previous records.  She was advised to start on Allegra  and Pepcid .  I will refer her to dermatology for evaluation.  Other medical problems are listed as follows:  Mixed hyperlipidemia  Mild intermittent asthma without complication  Irritable bowel syndrome with both constipation and diarrhea  Chronic abdominal pain  Hiatal hernia  Gastroesophageal reflux disease without esophagitis  Delayed gastric  emptying  Chronic migraine w/o aura w/o status migrainosus, not intractable  Bipolar 1 disorder (HCC)  Attention deficit hyperactivity disorder (ADHD), combined type  Other iron  deficiency anemia  Other allergic rhinitis  Other atopic dermatitis  Multiple drug allergies  Recurrent infections  History of sepsis  Status post hysterectomy  Orders: Orders Placed This Encounter  Procedures   Protein / creatinine ratio, urine   ANA   Anti-scleroderma antibody   RNP Antibody   Anti-Smith antibody   Sjogrens syndrome-A extractable nuclear antibody   Sjogrens syndrome-B extractable nuclear antibody   Anti-DNA antibody, double-stranded   C3 and C4   Beta-2  glycoprotein antibodies   Cardiolipin antibodies, IgG, IgM, IgA   Thyroid  peroxidase antibody   Thyroglobulin antibody   No orders of the defined types were placed in this encounter.  Face-to-face time spent patient was over 60 minutes.  Greater than 50% time was spent in counseling and coordination of care.  Previous records from her previous visits and Dr. Sherlene records were reviewed.  Follow-Up Instructions: Return for Positive ANA, myalgia, arthralgias.   Maya Nash, MD  August 04, 2024 Patient called after the office visit and stated that she does have photosensitivity.  She also mentioned that she has a rash on her temple which she forgot to show at the last office visit.  I will address it at the next office visit. Maya Nash, MD   Note - This record has been created using Animal nutritionist.  Chart creation errors have been sought, but may not always  have been located. Such creation errors do not reflect on  the standard of medical care.

## 2024-07-30 DIAGNOSIS — R768 Other specified abnormal immunological findings in serum: Secondary | ICD-10-CM | POA: Diagnosis not present

## 2024-07-31 ENCOUNTER — Telehealth: Payer: Self-pay

## 2024-07-31 LAB — PROTEIN / CREATININE RATIO, URINE
Creatinine, Urine: 346 mg/dL — ABNORMAL HIGH (ref 20–275)
Protein/Creat Ratio: 66 mg/g{creat} (ref 24–184)
Protein/Creatinine Ratio: 0.066 mg/mg{creat} (ref 0.024–0.184)
Total Protein, Urine: 23 mg/dL (ref 5–24)

## 2024-07-31 NOTE — Telephone Encounter (Signed)
 Patient contacted the office and wanted to let Dr. Dolphus know that she had miss understood a question from her visit on 07/29/2024. Patient states she wanted to advise Dr. Dolphus that she misunderstood the question about photosensitivity and that her eyes and skin are sensitive to light and she has not been outside much because of it. Patient states, in addition to her other rash, she does have scaly patches on the side of her forehead and hardening spots behind her ears. Patient states she wanted to let Dr. Dolphus know so she can add it to the visit note.

## 2024-08-02 LAB — CARDIOLIPIN ANTIBODIES, IGG, IGM, IGA
Anticardiolipin IgA: <2 [APL'U]/mL (ref <20.0)
Anticardiolipin IgG: <2 [GPL'U]/mL (ref <20.0)
Anticardiolipin IgM: <2 [MPL'U]/mL (ref <20.0)

## 2024-08-02 LAB — THYROID PEROXIDASE ANTIBODY: Thyroperoxidase Ab SerPl-aCnc: <1 [IU]/mL (ref <9)

## 2024-08-02 LAB — ANTI-NUCLEAR AB-TITER (ANA TITER): ANA Titer 1: 1:320 {titer} — ABNORMAL HIGH

## 2024-08-02 LAB — BETA-2 GLYCOPROTEIN ANTIBODIES
Beta-2 Glyco 1 IgA: <2 U/mL (ref <20.0)
Beta-2 Glyco 1 IgM: <2 U/mL (ref <20.0)
Beta-2 Glyco I IgG: <2 U/mL (ref <20.0)

## 2024-08-02 LAB — ANTI-SMITH ANTIBODY: ENA SM Ab Ser-aCnc: <1 AI

## 2024-08-02 LAB — SJOGRENS SYNDROME-B EXTRACTABLE NUCLEAR ANTIBODY: SSB (La) (ENA) Antibody, IgG: <1 AI

## 2024-08-02 LAB — ANTI-SCLERODERMA ANTIBODY: Scleroderma (Scl-70) (ENA) Antibody, IgG: <1 AI

## 2024-08-02 LAB — C3 AND C4
C3 Complement: 117 mg/dL (ref 83–193)
C4 Complement: 21 mg/dL (ref 15–57)

## 2024-08-02 LAB — THYROGLOBULIN ANTIBODY: Thyroglobulin Ab: <1 [IU]/mL (ref <=1)

## 2024-08-02 LAB — ANTI-DNA ANTIBODY, DOUBLE-STRANDED: ds DNA Ab: <1 [IU]/mL

## 2024-08-02 LAB — SJOGRENS SYNDROME-A EXTRACTABLE NUCLEAR ANTIBODY: SSA (Ro) (ENA) Antibody, IgG: <1 AI

## 2024-08-02 LAB — ANA: Anti Nuclear Antibody (ANA): POSITIVE — AB

## 2024-08-02 LAB — RNP ANTIBODY: Ribonucleic Protein(ENA) Antibody, IgG: <1 AI

## 2024-08-04 ENCOUNTER — Ambulatory Visit: Payer: Self-pay | Admitting: Rheumatology

## 2024-08-04 NOTE — Progress Notes (Signed)
 ANA is positive. All other labs are within normal limits .

## 2024-08-04 NOTE — Progress Notes (Unsigned)
   LILLETTE Ileana Collet, PhD, LAT, ATC acting as a scribe for Artist Lloyd, MD.  Lorane Massa Eiland is a 38 y.o. female who presents to [facility] today for evaluation of her hypermobility.  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   MSK: ***    Lab and Radiology Results No results found for this or any previous visit (from the past 72 hours). No results found.     Assessment and Plan: 38 y.o. female with ***   PDMP not reviewed this encounter. No orders of the defined types were placed in this encounter.  No orders of the defined types were placed in this encounter.    Discussed warning signs or symptoms. Please see discharge instructions. Patient expresses understanding.   ***

## 2024-08-05 ENCOUNTER — Ambulatory Visit: Admitting: Family Medicine

## 2024-08-05 VITALS — BP 104/72 | HR 57 | Ht 62.0 in | Wt 109.0 lb

## 2024-08-05 DIAGNOSIS — F902 Attention-deficit hyperactivity disorder, combined type: Secondary | ICD-10-CM | POA: Diagnosis not present

## 2024-08-05 DIAGNOSIS — M2550 Pain in unspecified joint: Secondary | ICD-10-CM | POA: Diagnosis not present

## 2024-08-05 DIAGNOSIS — G901 Familial dysautonomia [Riley-Day]: Secondary | ICD-10-CM

## 2024-08-05 DIAGNOSIS — Q796 Ehlers-Danlos syndrome, unspecified: Secondary | ICD-10-CM | POA: Insufficient documentation

## 2024-08-05 MED ORDER — LISDEXAMFETAMINE DIMESYLATE 10 MG PO CAPS: 10.0000 mg | ORAL_CAPSULE | Freq: Every day | ORAL | 0 refills | Status: DC

## 2024-08-05 MED ORDER — MONTELUKAST SODIUM 10 MG PO TABS: 10.0000 mg | ORAL_TABLET | Freq: Every day | ORAL | 3 refills | Status: DC

## 2024-08-05 NOTE — Patient Instructions (Addendum)
 Thank you for coming in today.   I've referred you to Physical Therapy.  Let us  know if you don't hear from them in one week.   Intake 3L water  and 8-12g salt daily  [email or web address]  I've sent a prescription for Vyvanse  & montelukast  to your pharmacy.   Check back in 1 month

## 2024-08-06 ENCOUNTER — Telehealth: Payer: Self-pay | Admitting: Family Medicine

## 2024-08-06 NOTE — Telephone Encounter (Signed)
 Reached out to patient to advise since labs are negative we can cancel her new patient follow up appointment. Patient does not want to cancel her follow up appointment.

## 2024-08-06 NOTE — Telephone Encounter (Signed)
 Patient called and has some concerns about results she received from rheumatology. She would like to ask some questions regarding getting second opinions. Please call patient back when you can.

## 2024-08-06 NOTE — Telephone Encounter (Signed)
 Return call and spoke to pt. She expressed frustration in the phone call that she received today from rheum. Being told all her labs were normal and that she could cancel her f/u visit.  Pt is looking for an explanation for her consistent abnormal lab results. She is wondering if some type of auto-immune disease is possible? Lupus?

## 2024-08-06 NOTE — Telephone Encounter (Signed)
 I called Allie back. I do think she needs to keep her appointment with Dr Dolphus.  I will see her soon.

## 2024-08-08 ENCOUNTER — Other Ambulatory Visit (HOSPITAL_COMMUNITY)
Admission: RE | Admit: 2024-08-08 | Discharge: 2024-08-08 | Disposition: A | Discharge status: Home / Self Care | Payer: Self-pay | Source: Ambulatory Visit | Attending: Medical Genetics | Admitting: Medical Genetics

## 2024-08-11 ENCOUNTER — Ambulatory Visit (INDEPENDENT_AMBULATORY_CARE_PROVIDER_SITE_OTHER): Admitting: Clinical

## 2024-08-11 DIAGNOSIS — F4310 Post-traumatic stress disorder, unspecified: Secondary | ICD-10-CM

## 2024-08-11 DIAGNOSIS — Z658 Other specified problems related to psychosocial circumstances: Secondary | ICD-10-CM

## 2024-08-11 DIAGNOSIS — F909 Attention-deficit hyperactivity disorder, unspecified type: Secondary | ICD-10-CM

## 2024-08-11 NOTE — Progress Notes (Unsigned)
 Rachel [name]

## 2024-08-12 ENCOUNTER — Encounter

## 2024-08-13 NOTE — BH Specialist Note (Signed)
 Integrated Behavioral Health via Telemedicine Visit  08/13/2024 Rachel [name] [id number]  Number of Integrated Behavioral Health Clinician visits: Additional Visit  Session Start time: 1057   Session End time: 1208  Total time in minutes: 71  Referring Provider: Jerilynn Buddle, MD Patient/Family location: Home [facility] Provider location: [facility] All persons participating in visit: [name] and Rachel [name]   Types of Service: Individual psychotherapy and Telephone visit  I connected with Rachel [name] [name]'s n/a via  Telephone or Video Enabled Telemedicine Application  (Video is Caregility application) and verified that I am speaking with the correct person using two identifiers. Discussed confidentiality: Yes   I discussed the limitations of telemedicine and the availability of in person appointments.  Discussed there is a possibility of technology failure and discussed alternative modes of communication if that failure occurs.  I discussed that engaging in this telemedicine visit, they consent to the provision of behavioral healthcare and the services will be billed under their insurance.  Patient and/or legal guardian expressed understanding and consented to Telemedicine visit: Yes   Presenting Concerns: Patient and/or family reports the following symptoms/concerns: Processing feeling validated by recent confirmation of Ehlers-Danlos syndrome, Mast cell activation and POTS subtype; anxious regarding uncertainty regarding recent labwork, as well as potential of current relationship. Pt has started taking Vyvanse  for ADHD and hydroxyzine  for rash.  Duration of problem: Ongoing; Severity of problem: moderately severe  Patient and/or Family's Strengths/Protective Factors: Social connections, Social and Emotional competence, and Sense of purpose  Goals  Addressed: Patient will:  Reduce symptoms of: anxiety, depression, and stress    Demonstrate ability to: Increase healthy adjustment to current life circumstances and Increase motivation to adhere to plan of care  Progress towards Goals: Ongoing    Interventions: Interventions utilized:  Supportive Reflection Standardized Assessments completed: Not Needed    Patient and/or Family Response: Patient agrees with treatment plan.   Clinical Assessment/Diagnosis  Post traumatic stress disorder (PTSD)  Attention deficit hyperactivity disorder (ADHD), unspecified ADHD type  Psychosocial stressors    Patient may benefit from continued therapeutic intervention .  Plan: Follow up with behavioral health clinician on : Two weeks Behavioral recommendations:  -Continue taking Vyvanse  and hydroxyzine  as prescribed -Continue self-advocacy for appropriate healthcare -Continue daily self-coping strategies as needed (priority on time with supportive people in life; healthy self-care; maintaining creative outlet) Referral(s): Integrated [employer] (In Clinic)  I discussed the assessment and treatment plan with the patient and/or parent/guardian. They were provided an opportunity to ask questions and all were answered. They agreed with the plan and demonstrated an understanding of the instructions.   They were advised to call back or seek an in-person evaluation if the symptoms worsen or if the condition fails to improve as anticipated.  [name], LCSW     07/28/2024    9:13 AM 01/23/2024    9:08 AM 04/23/2023    8:50 AM 11/02/2022    9:23 AM 08/02/2022    2:20 PM  Depression screen PHQ 2/9  Decreased Interest 1 2 0 0 0  Down, Depressed, Hopeless 0 2 1 1  0  PHQ - 2 Score 1 4 1 1  0  Altered sleeping 2 3 2 1 2   Tired, decreased energy 2 3 2 1 2   Change in appetite 1 3 2 1 1   Feeling bad or failure about yourself  0 0 0 0 0  Trouble concentrating 2 2 1  0 1   Moving slowly or fidgety/restless 0 0 0 0 0  Suicidal thoughts 0 0 0 0 0  PHQ-9 Score 8 15 8 4 6       01/23/2024    9:10 AM 04/23/2023    8:51 AM 11/02/2022    9:24 AM 08/02/2022    2:20 PM  GAD 7 : Generalized Anxiety Score  Nervous, Anxious, on Edge 2 2 1  0  Control/stop worrying 2 1 0 0  Worry too much - different things 2 0 0 0  Trouble relaxing 0 0 1 1  Restless 0 0 0 0  Easily annoyed or irritable 0 0 0 0  Afraid - awful might happen 3 0 0 0  Total GAD 7 Score 9 3 2  1

## 2024-08-15 ENCOUNTER — Other Ambulatory Visit: Payer: Self-pay | Admitting: Family Medicine

## 2024-08-15 DIAGNOSIS — I700 Atherosclerosis of aorta: Secondary | ICD-10-CM

## 2024-08-15 DIAGNOSIS — D509 Iron deficiency anemia, unspecified: Secondary | ICD-10-CM

## 2024-08-19 LAB — GENECONNECT MOLECULAR SCREEN: Genetic Analysis Overall Interpretation: NEGATIVE

## 2024-08-22 NOTE — BH Specialist Note (Signed)
 Integrated Behavioral Health via Telemedicine Visit  08/25/2024 Rachel [name] [id number]  Number of Integrated Behavioral Health Clinician visits: Additional Visit  Session Start time: 1059   Session End time: 1217  Total time in minutes: 78  Referring Provider: Jerilynn Buddle, MD Patient/Family location: Home [facility] Provider location: [facility]  All persons participating in visit: [name] and [facility] Kate Sweetman   Types of Service: Individual psychotherapy and Telephone visit  I connected with Rachel [name] [name]'s n/a via  Telephone or Video Enabled Telemedicine Application  (Video is Caregility application) and verified that I am speaking with the correct person using two identifiers. Discussed confidentiality: Yes   I discussed the limitations of telemedicine and the availability of in person appointments.  Discussed there is a possibility of technology failure and discussed alternative modes of communication if that failure occurs.  I discussed that engaging in this telemedicine visit, they consent to the provision of behavioral healthcare and the services will be billed under their insurance.  Patient and/or legal guardian expressed understanding and consented to Telemedicine visit: Yes   Presenting Concerns: Patient and/or family reports the following symptoms/concerns: Recent depression increase, triggered by loss of a friendship, people not taking her chronic health issues seriously, family yelling at her, feeling dismissed by specific healthcare providers; continued coping with great friend support.  Duration of problem: Ongoing; Severity of problem: Moderately severe  Patient and/or Family's Strengths/Protective Factors: Social connections, Social and Emotional competence, and Sense of purpose  Goals Addressed: Patient will:  Reduce symptoms of:  anxiety, depression, and stress   Increase knowledge and/or ability of: n/a   Demonstrate ability to: Increase healthy adjustment to current life circumstances and Increase motivation to adhere to plan of care  Progress towards Goals: Ongoing  Interventions: Interventions utilized:  Supportive Reflection Standardized Assessments completed: Not Needed  Patient and/or Family Response: Patient agrees with treatment plan.  Clinical Assessment/Diagnosis  Post traumatic stress disorder (PTSD)  Attention deficit hyperactivity disorder (ADHD), unspecified ADHD type  Psychosocial stressors   Patient may benefit from continued therapeutic intervention.  Plan: Follow up with behavioral health clinician on : Two weeks Behavioral recommendations:  -Continue taking Vyvanse  and hydroxyzine  as prescribed -Continue daily self-coping strategies (time with supportive people in life; healthy self-care; self-advocacy regarding health care; creative outlet) Referral(s): Integrated [employer] (In Clinic)  I discussed the assessment and treatment plan with the patient and/or parent/guardian. They were provided an opportunity to ask questions and all were answered. They agreed with the plan and demonstrated an understanding of the instructions.   They were advised to call back or seek an in-person evaluation if the symptoms worsen or if the condition fails to improve as anticipated.  [name], LCSW     07/28/2024    9:13 AM 01/23/2024    9:08 AM 04/23/2023    8:50 AM 11/02/2022    9:23 AM 08/02/2022    2:20 PM  Depression screen PHQ 2/9  Decreased Interest 1 2 0 0 0  Down, Depressed, Hopeless 0 2 1 1  0  PHQ - 2 Score 1 4 1 1  0  Altered sleeping 2 3 2 1 2   Tired, decreased energy 2 3 2 1 2   Change in appetite 1 3 2 1 1   Feeling bad or failure about yourself  0 0 0 0 0  Trouble concentrating 2 2 1  0 1  Moving slowly or  fidgety/restless 0 0 0 0 0  Suicidal thoughts 0 0 0  0 0  PHQ-9 Score 8 15 8 4 6       01/23/2024    9:10 AM 04/23/2023    8:51 AM 11/02/2022    9:24 AM 08/02/2022    2:20 PM  GAD 7 : Generalized Anxiety Score  Nervous, Anxious, on Edge 2 2 1  0  Control/stop worrying 2 1 0 0  Worry too much - different things 2 0 0 0  Trouble relaxing 0 0 1 1  Restless 0 0 0 0  Easily annoyed or irritable 0 0 0 0  Afraid - awful might happen 3 0 0 0  Total GAD 7 Score 9 3 2  1

## 2024-08-24 NOTE — Therapy (Signed)
 OUTPATIENT PHYSICAL THERAPY THORACOLUMBAR EVALUATION   Patient Name: Rachel [name] MRN: [id number] DOB:Dec 21, 1985, 38 y.o., female Today's Date: 08/25/2024  END OF SESSION:  PT End of Session - 08/25/24 0919     Visit Number 1    Date for Recertification  10/10/24    Authorization Type [employer]    PT Start Time [phone]    PT Stop Time 0930    PT Time Calculation (min) 43 min    Activity Tolerance Patient tolerated treatment well    Behavior During Therapy [facility] for tasks assessed/performed          Past Medical History:  Diagnosis Date   ADHD (attention deficit hyperactivity disorder)    Anemia    Anxiety    Asthma    Bipolar disorder (HCC)    Complication of anesthesia    slow to wake up    Delayed gastric emptying 02/23/2020   Depression    DUB (dysfunctional uterine bleeding) 02/23/2020   Eczema    Ehlers-Danlos syndrome    Endometriosis    Gastroparesis    GERD (gastroesophageal reflux disease)    Heart murmur    Hiatal hernia 04/06/2020   Myalgia 04/06/2020   Nausea & vomiting 09/22/2022   Parasitic infection 02/23/2020   Pneumonia    Poor appetite 02/23/2020   Recurrent upper respiratory infection (URI)    RUQ pain 02/23/2020   Vitamin D  deficiency    Weight loss 02/23/2020   Past Surgical History:  Procedure Laterality Date   barium study     COLONOSCOPY     CYSTOSCOPY N/A 07/12/2021   Procedure: CYSTOSCOPY;  Surgeon: Zina Jerilynn LABOR, MD;  Location[facility] OR;  Service: Gynecology;  Laterality: N/A;   ESOPHAGOGASTRODUODENOSCOPY  2011   and a ph study as well.    LAPAROSCOPIC GASTRIC RESECTION N/A 01/22/2023   Procedure: LAPAROSCOPIC GASTRIC PEXY;  Surgeon: Lyndel Deward PARAS, MD;  Location: [facility] ORS;  Service: General;  Laterality: N/A;   LAPAROSCOPY N/A 01/26/2021   Procedure: LAPAROSCOPY DIAGNOSTIC;  Surgeon: Zina Jerilynn LABOR, MD;  Location: [facility];  Service: Gynecology;  Laterality: N/A;   NASAL SEPTUM SURGERY      SINOSCOPY     TONGUE FLAP     TOTAL LAPAROSCOPIC HYSTERECTOMY WITH SALPINGECTOMY Bilateral 07/12/2021   Procedure: TOTAL LAPAROSCOPIC HYSTERECTOMY WITH SALPINGECTOMY, OOPHORECTOMY;  Surgeon: Zina Jerilynn LABOR, MD;  Location: [facility] OR;  Service: Gynecology;  Laterality: Bilateral;   UPPER GASTROINTESTINAL ENDOSCOPY     WISDOM TOOTH EXTRACTION     Patient Active Problem List   Diagnosis Date Noted   Ehlers-Danlos syndrome 08/05/2024   Acute stress reaction 06/14/2023   Chronic migraine w/o aura w/o status migrainosus, not intractable 02/23/2023   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   Memory loss 12/25/2022   Paresthesia 12/25/2022   Other allergic rhinitis 12/04/2022   Allergic conjunctivitis of both eyes 12/04/2022   Mild intermittent asthma without complication 12/04/2022   Other atopic dermatitis 12/04/2022   Allergic reaction 12/04/2022   Rash and other nonspecific skin eruption 12/04/2022   Multiple drug allergies 12/04/2022   Other adverse food reactions, not elsewhere classified, subsequent encounter 12/04/2022   Abnormal CT of the abdomen 09/23/2022   Abdominal pain 09/22/2022   Asthma, chronic 09/22/2022   HLD (hyperlipidemia) 09/22/2022   Nausea & vomiting 09/22/2022   Iron  deficiency anemia 01/20/2022   Acute hyponatremia 01/18/2022   Pyelonephritis 01/18/2022   Protein-calorie malnutrition, severe 01/18/2022   sepsis secondary to pyelonephritis of left  kidney 01/17/2022   Sepsis (HCC) 01/17/2022   Chronic abdominal pain 01/17/2022   Dyspnea 01/17/2022   Underweight 01/17/2022   Irritable bowel syndrome (IBS) 01/17/2022   Hypokalemia 01/17/2022   Vasomotor symptoms due to menopause 08/17/2021   S/P laparoscopic hysterectomy 07/12/2021   Preoperative exam for gynecologic surgery 06/20/2021   Draining postoperative wound 02/28/2021   Encounter for postoperative care 02/16/2021   Endometriosis determined by laparoscopy    Pelvic pain in female 10/11/2020   Presence of  subdermal contraceptive implant 10/11/2020   Dysmenorrhea 10/11/2020   Dyspareunia in female 10/11/2020   NSVT (nonsustained ventricular tachycardia) (HCC) 09/03/2020   Gastroparesis 08/18/2020   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   Palpitations 07/26/2020   Family history of connective tissue disease 07/26/2020   Myalgia 04/06/2020   Hiatal hernia 04/06/2020   Recurrent infections 02/23/2020   Weight loss 02/23/2020   Poor appetite 02/23/2020   RUQ pain 02/23/2020   Delayed gastric emptying 02/23/2020   DUB (dysfunctional uterine bleeding) 02/23/2020   Cervical radiculopathy 05/31/2018   Hyperalgesia 05/31/2018   Muscle weakness 03/13/2018   GERD (gastroesophageal reflux disease) 12/26/2017   ADHD (attention deficit hyperactivity disorder) 05/07/2012   Bipolar 1 disorder (HCC) 05/07/2012    PCP: Corrina Sabin MD  REFERRING PROVIDER:   Dolphus Reiter, MD    REFERRING DIAG:  M79.10 (ICD-10-CM) - Myalgia  M25.50 (ICD-10-CM) - Hypermobility arthralgia    Rationale for Evaluation and Treatment: Rehabilitation  THERAPY DIAG:  Muscle weakness (generalized)  Other low back pain  Other abnormalities of gait and mobility  ONSET DATE: Chronic  SUBJECTIVE:                                                                                                                                                                                           SUBJECTIVE STATEMENT: Last year has been an nightmare. Mds think I have a autoimmune disease. Official dx with EDS and POTS. Dr Joane wants me back in aquatics because land puts me into my bed.  I have bee getting dizzy (due to POTS).  I have been able to come to the gym at times at least 1 time a week (40 minutes treadmill, some machines).  I have not been able to come to the pool at the times that it is available  PERTINENT HISTORY:  Dr Deveshwar:Water  therapy/PT for hypermobility and myofascial pain syndrome.   Dr Joane: Evaluate  and Treat for hypermobility and polyarthralgia 1-2 times per week for 4-6 weeks.   Decrease pain, increase strength, flexibility, function, and range of motion.  Modalities may include, traction, ionto, phono, stim, and dry needling prn.  Gastroparesis, ADHD; bilpolar; hypermobility; Myofacial pain syndrome; EDS; dysautonomia   PAIN:  Are you having pain? Yes: NPRS scale: current 7/10; worst 9/10; least 6.5 Pain location: generally cervical spine neck shoulders and spine Pain description: Variable:achy, stiff painful sometimes radiating Aggravating factors: movement, upright sitting, standing Relieving factors: lying down; reclines, heating pad  PRECAUTIONS: None  RED FLAGS: None   WEIGHT BEARING RESTRICTIONS: No  FALLS:  Has patient fallen in last 6 months? Unknown  LIVING ENVIRONMENT:  Steps inside and outside   OCCUPATION: disabled  PLOF: Needs assistance with ADLs Variable  PATIENT GOALS: decrease pain, become more active  NEXT MD VISIT: as needed  OBJECTIVE:  Note: Objective measures were completed at Evaluation unless otherwise noted.   PATIENT SURVEYS:  ODI: 38/50= 76%  COGNITION: Overall cognitive status: Within functional limits for tasks assessed     SENSATION: generalized hyperalgesia   POSTURE: forward head  PALPATION: TTP throughout   LUMBAR ROM:   Hypermobile  LOWER EXTREMITY ROM:     Hypermobile  LOWER EXTREMITY MMT:    MMT Right eval Left eval  Hip flexion 4 4  Hip extension    Hip abduction    Hip adduction    Hip internal rotation    Hip external rotation    Knee flexion 4 4  Knee extension 4 4  Ankle dorsiflexion    Ankle plantarflexion    Ankle inversion    Ankle eversion     (Blank rows = not tested)   FUNCTIONAL TESTS:  Timed up and go (TUG): 18.86   4 stage balance:FT x 20s; Unable to complete semi-tandem; tandem; SLS GAIT: Distance walked: 400 ft Assistive device utilized: None Level of assistance:  Complete Independence Comments: Gait slowed. Pause upon standing to ensure steadiness  TREATMENT  Eval Self care:Posture and body mechanic instruction                                                                                                                                 PATIENT EDUCATION:  Education details: Discussed eval findings, rehab rationale, aquatic program progression/POC and pools in area. Patient is in agreement  Person educated: Patient Education method: Explanation Education comprehension: verbalized understanding  HOME EXERCISE PROGRAM: From previous episode x 1 yr ago: Access Code: [email or web address]   ASSESSMENT:  CLINICAL IMPRESSION: Patient is a 38 y.o. f who was seen today for physical therapy evaluation and treatment for hypomobility and myofacial pain syndrome. She is well known to this clinic having been seen multiple times in aquatics with similar dx.  She returns today with added dx of dysautonomia, POTS and possible autoimmune disorder. Patient presents with general pain limited deficits throughout shoulders and entire spine effecting endurance, activity tolerance, gait, balance, and functional mobility with ADL's. As indicated by her outcomes measure score, subjective information and objective testing she is having to modify all activities for safety.  She will benefit from aquatic physical therapy  intervention to improve all areas of deficits and instruct on aquatic HEP encouraging a more frequent exercise regimen.   OBJECTIVE IMPAIRMENTS: decreased activity tolerance, decreased balance, decreased endurance, decreased mobility, decreased strength, dizziness, pain, and hypermobility.   ACTIVITY LIMITATIONS: carrying, lifting, bending, sitting, standing, squatting, stairs, and locomotion level  PARTICIPATION LIMITATIONS: meal prep, cleaning, laundry, driving, shopping, community activity, occupation, and yard work  PERSONAL FACTORS: Behavior pattern, Past/current  experiences, Time since onset of injury/illness/exacerbation, Transportation, and 1-2 comorbidities: See PmHx are also affecting patient's functional outcome.   REHAB POTENTIAL: Fair chronicity of condition  CLINICAL DECISION MAKING: Stable/uncomplicated  EVALUATION COMPLEXITY: Low   GOALS: Goals reviewed with patient? Yes  SHORT TERM GOALS: Target date: 10/25  Pt will tolerate full aquatic sessions consistently without increase in pain and with improving function to demonstrate good toleration and effectiveness of intervention.  Baseline: Goal status: INITIAL   2.  Pt will tolerate amb 5 consecutive minutes submerged to demonstrates progression of toleration to activity Baseline:  Goal status: INITIAL   LONG TERM GOALS: Target date: 10/10/24  Pt to improve on ODI by 13% to demonstrate statistically significant Improvement in function. (MCID 13-15%) Baseline:38/50= 76% Goal status: INITIAL  2.  Pt will report return to routine exercise regimen Baseline:  Goal status: INITIAL  3.  Pt will report decrease in pain by at least 50% for improved toleration to activity/quality of life and to demonstrate improved management of pain. Baseline:  Goal status: INITIAL  4.  Pt will be indep with final HEP's (land and aquatic as appropriate) for continued management of condition Baseline:  Goal status: INITIAL  5.  Pt will tolerate full 30 minutes of aquatic exercise to demonstrate an improvement in activity toleration Baseline:  Goal status: INITIAL   PLAN:  PT FREQUENCY: 1-2x/week  PT DURATION: 6 weeks   PLANNED INTERVENTIONS: 97164- PT Re-evaluation, 97750- Physical Performance Testing, 97110-Therapeutic exercises, 97530- Therapeutic activity, [id number]- Neuromuscular re-education, 97535- Self Care, [id number]- Manual therapy, [id number]- Gait training, [phone]- Aquatic Therapy, [phone] (1-2 muscles), 20561 (3+ muscles)- Dry Needling, Patient/Family education, Balance training, Stair  training, Taping, Joint mobilization, DME instructions, Cryotherapy, and Moist heat.  PLAN FOR NEXT SESSION: aquatics only: general strengthening; endurance training; pain management   Ronal Foots) Kenzie Thoreson MPT 08/25/24 9:21 AM [facility] [address] [place], [place], [address] Phone: [phone]   Fax:  [phone]  For all possible CPT codes, reference the Planned Interventions line above.     Check all conditions that are expected to impact treatment: {Conditions expected to impact treatment:Musculoskeletal disorders   If treatment provided at initial evaluation, no treatment charged due to lack of authorization.

## 2024-08-25 ENCOUNTER — Ambulatory Visit: Admitting: Clinical

## 2024-08-25 ENCOUNTER — Ambulatory Visit (HOSPITAL_BASED_OUTPATIENT_CLINIC_OR_DEPARTMENT_OTHER): Attending: Rheumatology | Admitting: Physical Therapy

## 2024-08-25 ENCOUNTER — Other Ambulatory Visit: Payer: Self-pay

## 2024-08-25 ENCOUNTER — Encounter (HOSPITAL_BASED_OUTPATIENT_CLINIC_OR_DEPARTMENT_OTHER): Payer: Self-pay | Admitting: Physical Therapy

## 2024-08-25 DIAGNOSIS — M5459 Other low back pain: Secondary | ICD-10-CM | POA: Diagnosis not present

## 2024-08-25 DIAGNOSIS — M6281 Muscle weakness (generalized): Secondary | ICD-10-CM | POA: Diagnosis not present

## 2024-08-25 DIAGNOSIS — M2550 Pain in unspecified joint: Secondary | ICD-10-CM | POA: Insufficient documentation

## 2024-08-25 DIAGNOSIS — F4310 Post-traumatic stress disorder, unspecified: Secondary | ICD-10-CM | POA: Diagnosis not present

## 2024-08-25 DIAGNOSIS — R2689 Other abnormalities of gait and mobility: Secondary | ICD-10-CM | POA: Insufficient documentation

## 2024-08-25 DIAGNOSIS — M791 Myalgia, unspecified site: Secondary | ICD-10-CM | POA: Diagnosis not present

## 2024-08-25 DIAGNOSIS — Z658 Other specified problems related to psychosocial circumstances: Secondary | ICD-10-CM

## 2024-08-25 DIAGNOSIS — F909 Attention-deficit hyperactivity disorder, unspecified type: Secondary | ICD-10-CM

## 2024-09-03 ENCOUNTER — Encounter (HOSPITAL_BASED_OUTPATIENT_CLINIC_OR_DEPARTMENT_OTHER): Payer: Self-pay | Admitting: Physical Therapy

## 2024-09-03 ENCOUNTER — Ambulatory Visit (HOSPITAL_BASED_OUTPATIENT_CLINIC_OR_DEPARTMENT_OTHER): Admitting: Physical Therapy

## 2024-09-03 DIAGNOSIS — M5459 Other low back pain: Secondary | ICD-10-CM | POA: Diagnosis not present

## 2024-09-03 DIAGNOSIS — M6281 Muscle weakness (generalized): Secondary | ICD-10-CM | POA: Diagnosis not present

## 2024-09-03 DIAGNOSIS — R2689 Other abnormalities of gait and mobility: Secondary | ICD-10-CM

## 2024-09-03 DIAGNOSIS — M2550 Pain in unspecified joint: Secondary | ICD-10-CM | POA: Diagnosis not present

## 2024-09-03 DIAGNOSIS — M791 Myalgia, unspecified site: Secondary | ICD-10-CM | POA: Diagnosis not present

## 2024-09-03 NOTE — Therapy (Addendum)
 OUTPATIENT PHYSICAL THERAPY THORACOLUMBAR TREATMENT   Patient Name: Rachel [name] MRN: [id number] DOB:1985-12-03, 38 y.o., female Today's Date: 09/03/2024  END OF SESSION:  PT End of Session - 09/03/24 1048     Visit Number 2    Date for Recertification  10/10/24    Authorization Type [facility]    Authorization - Visit Number 2    Authorization - Number of Visits 7    PT Start Time 1048    PT Stop Time 1130    PT Time Calculation (min) 42 min    Activity Tolerance Patient tolerated treatment well    Behavior During Therapy WFL for tasks assessed/performed          Past Medical History:  Diagnosis Date   ADHD (attention deficit hyperactivity disorder)    Anemia    Anxiety    Asthma    Bipolar disorder (HCC)    Complication of anesthesia    slow to wake up    Delayed gastric emptying 02/23/2020   Depression    DUB (dysfunctional uterine bleeding) 02/23/2020   Eczema    Ehlers-Danlos syndrome    Endometriosis    Gastroparesis    GERD (gastroesophageal reflux disease)    Heart murmur    Hiatal hernia 04/06/2020   Myalgia 04/06/2020   Nausea & vomiting 09/22/2022   Parasitic infection 02/23/2020   Pneumonia    Poor appetite 02/23/2020   Recurrent upper respiratory infection (URI)    RUQ pain 02/23/2020   Vitamin D  deficiency    Weight loss 02/23/2020   Past Surgical History:  Procedure Laterality Date   barium study     COLONOSCOPY     CYSTOSCOPY N/A 07/12/2021   Procedure: CYSTOSCOPY;  Surgeon: Zina Jerilynn LABOR, MD;  Location: [facility] OR;  Service: Gynecology;  Laterality: N/A;   ESOPHAGOGASTRODUODENOSCOPY  2011   and a ph study as well.    LAPAROSCOPIC GASTRIC RESECTION N/A 01/22/2023   Procedure: LAPAROSCOPIC GASTRIC PEXY;  Surgeon: Lyndel Deward PARAS, MD;  Location: [facility] ORS;  Service: General;  Laterality: N/A;   LAPAROSCOPY N/A 01/26/2021   Procedure: LAPAROSCOPY DIAGNOSTIC;  Surgeon: Zina Jerilynn LABOR, MD;  Location:  SURGERY  CENTER;  Service: Gynecology;  Laterality: N/A;   NASAL SEPTUM SURGERY     SINOSCOPY     TONGUE FLAP     TOTAL LAPAROSCOPIC HYSTERECTOMY WITH SALPINGECTOMY Bilateral 07/12/2021   Procedure: TOTAL LAPAROSCOPIC HYSTERECTOMY WITH SALPINGECTOMY, OOPHORECTOMY;  Surgeon: Zina Jerilynn LABOR, MD;  Location: [facility] OR;  Service: Gynecology;  Laterality: Bilateral;   UPPER GASTROINTESTINAL ENDOSCOPY     WISDOM TOOTH EXTRACTION     Patient Active Problem List   Diagnosis Date Noted   Ehlers-Danlos syndrome 08/05/2024   Acute stress reaction 06/14/2023   Chronic migraine w/o aura w/o status migrainosus, not intractable 02/23/2023   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   Memory loss 12/25/2022   Paresthesia 12/25/2022   Other allergic rhinitis 12/04/2022   Allergic conjunctivitis of both eyes 12/04/2022   Mild intermittent asthma without complication 12/04/2022   Other atopic dermatitis 12/04/2022   Allergic reaction 12/04/2022   Rash and other nonspecific skin eruption 12/04/2022   Multiple drug allergies 12/04/2022   Other adverse food reactions, not elsewhere classified, subsequent encounter 12/04/2022   Abnormal CT of the abdomen 09/23/2022   Abdominal pain 09/22/2022   Asthma, chronic 09/22/2022   HLD (hyperlipidemia) 09/22/2022   Nausea & vomiting 09/22/2022   Iron  deficiency anemia 01/20/2022   Acute hyponatremia 01/18/2022  Pyelonephritis 01/18/2022   Protein-calorie malnutrition, severe 01/18/2022   sepsis secondary to pyelonephritis of left kidney 01/17/2022   Sepsis (HCC) 01/17/2022   Chronic abdominal pain 01/17/2022   Dyspnea 01/17/2022   Underweight 01/17/2022   Irritable bowel syndrome (IBS) 01/17/2022   Hypokalemia 01/17/2022   Vasomotor symptoms due to menopause 08/17/2021   S/P laparoscopic hysterectomy 07/12/2021   Preoperative exam for gynecologic surgery 06/20/2021   Draining postoperative wound 02/28/2021   Encounter for postoperative care 02/16/2021   Endometriosis  determined by laparoscopy    Pelvic pain in female 10/11/2020   Presence of subdermal contraceptive implant 10/11/2020   Dysmenorrhea 10/11/2020   Dyspareunia in female 10/11/2020   NSVT (nonsustained ventricular tachycardia) (HCC) 09/03/2020   Gastroparesis 08/18/2020   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   Palpitations 07/26/2020   Family history of connective tissue disease 07/26/2020   Myalgia 04/06/2020   Hiatal hernia 04/06/2020   Recurrent infections 02/23/2020   Weight loss 02/23/2020   Poor appetite 02/23/2020   RUQ pain 02/23/2020   Delayed gastric emptying 02/23/2020   DUB (dysfunctional uterine bleeding) 02/23/2020   Cervical radiculopathy 05/31/2018   Hyperalgesia 05/31/2018   Muscle weakness 03/13/2018   GERD (gastroesophageal reflux disease) 12/26/2017   ADHD (attention deficit hyperactivity disorder) 05/07/2012   Bipolar 1 disorder (HCC) 05/07/2012    PCP: Corrina Sabin MD  REFERRING PROVIDER:   Dolphus Reiter, MD    REFERRING DIAG:  M79.10 (ICD-10-CM) - Myalgia  M25.50 (ICD-10-CM) - Hypermobility arthralgia    Rationale for Evaluation and Treatment: Rehabilitation  THERAPY DIAG:  Muscle weakness (generalized)  Other low back pain  Other abnormalities of gait and mobility  ONSET DATE: Chronic  SUBJECTIVE:                                                                                                                                                                                           SUBJECTIVE STATEMENT: I have had brain fog for the past week.  Today is bad. I have water  retention in my LB and hips. Pain 8/10. I haven't been able to tolerate any exercise in the gym last week or two.  Initial subjective Last year has been an nightmare. Mds think I have a autoimmune disease. Official dx with EDS and POTS. Dr Joane wants me back in aquatics because land puts me into my bed.  I have bee getting dizzy (due to POTS).  I have been able to come  to the gym at times at least 1 time a week (40 minutes treadmill, some machines).  I have not been able to come to the pool at the times that  it is available  PERTINENT HISTORY:  Dr Deveshwar:Water  therapy/PT for hypermobility and myofascial pain syndrome.   Dr Joane: Evaluate and Treat for hypermobility and polyarthralgia 1-2 times per week for 4-6 weeks.   Decrease pain, increase strength, flexibility, function, and range of motion.  Modalities may include, traction, ionto, phono, stim, and dry needling prn.  Gastroparesis, ADHD; bilpolar; hypermobility; Myofacial pain syndrome; EDS; dysautonomia   PAIN:  Are you having pain? Yes: NPRS scale: current 7/10; worst 9/10; least 6.5 Pain location: generally cervical spine neck shoulders and spine Pain description: Variable:achy, stiff painful sometimes radiating Aggravating factors: movement, upright sitting, standing Relieving factors: lying down; reclines, heating pad  PRECAUTIONS: None  RED FLAGS: None   WEIGHT BEARING RESTRICTIONS: No  FALLS:  Has patient fallen in last 6 months? Unknown  LIVING ENVIRONMENT:  Steps inside and outside   OCCUPATION: disabled  PLOF: Needs assistance with ADLs Variable  PATIENT GOALS: decrease pain, become more active  NEXT MD VISIT: as needed  OBJECTIVE:  Note: Objective measures were completed at Evaluation unless otherwise noted.   PATIENT SURVEYS:  ODI: 38/50= 76%  COGNITION: Overall cognitive status: Within functional limits for tasks assessed     SENSATION: generalized hyperalgesia   POSTURE: forward head  PALPATION: TTP throughout   LUMBAR ROM:   Hypermobile  LOWER EXTREMITY ROM:     Hypermobile  LOWER EXTREMITY MMT:    MMT Right eval Left eval  Hip flexion 4 4  Hip extension    Hip abduction    Hip adduction    Hip internal rotation    Hip external rotation    Knee flexion 4 4  Knee extension 4 4  Ankle dorsiflexion    Ankle plantarflexion     Ankle inversion    Ankle eversion     (Blank rows = not tested)   FUNCTIONAL TESTS:  Timed up and go (TUG): 18.86   4 stage balance:FT x 20s; Unable to complete semi-tandem; tandem; SLS GAIT: Distance walked: 400 ft Assistive device utilized: None Level of assistance: Complete Independence Comments: Gait slowed. Pause upon standing to ensure steadiness  TREATMENT  [facility] Adult PT Treatment:                                                DATE: 09/03/24 Pt seen for aquatic therapy today.  Treatment took place in water  3.5-4.75 ft in depth at the [facility] pool. Temp of water  was 91.  Pt entered/exited the pool via stairs using step through pattern with hand rail.   *walking forward, back and side stepping in 3.6 ft with ue support of solid noodle  - seated rest periods *PPT and hip hiking seated swing like on solid noodle  - squatted rest period *TrA set using 1/2 noodle wide stance x 8; staggered stances x 5 (some forearm discomfort which reduces with hand placement adjustment)  - squatted rest period *cycling on straddled noodle; ue support corner wall hip add/abd  - squatted rest period *Ue support on wall 3.6 ft: toe raises; heel raises; hip add/abd; hip extension *seated balance on solid noodle. VC on core engagement   Pt requires the buoyancy and hydrostatic pressure of water  for support, and to offload joints by unweighting joint load by at least 50 % in navel deep water  and by at least 75-80% in chest to  neck deep water .  Viscosity of the water  is needed for resistance of strengthening. Water  current perturbations provides challenge to standing balance requiring increased core activation.                                                 PATIENT EDUCATION:  Education details: Discussed eval findings, rehab rationale, aquatic program progression/POC and pools in area. Patient is in agreement  Person educated: Patient Education method: Explanation Education  comprehension: verbalized understanding  HOME EXERCISE PROGRAM: From previous episode x 1 yr ago: Access Code: [email or web address]   ASSESSMENT:  CLINICAL IMPRESSION: Pt demonstrates safety and independence in aquatic setting with therapist instructing from deck. Pt is confident in setting, moving throughout all depths easily.  Pt is directed through various movement patterns and trials in both sitting and standing positions.   Pt is provided VC and demonstration throughout session for execution of exercises and while monitoring toleration.She requires multiple rest periods.  She VU to communicate with therapist in regards to over exertion, dizziness or fatigue. Good toleration to initial session. She is a good candidate for aquatic intervention and will benefit from the properties of water  to progress towards functional goals.     Initial Impression Patient is a 38 y.o. f who was seen today for physical therapy evaluation and treatment for hypomobility and myofacial pain syndrome. She is well known to this clinic having been seen multiple times in aquatics with similar dx.  She returns today with added dx of dysautonomia, POTS and possible autoimmune disorder. Patient presents with general pain limited deficits throughout shoulders and entire spine effecting endurance, activity tolerance, gait, balance, and functional mobility with ADL's. As indicated by her outcomes measure score, subjective information and objective testing she is having to modify all activities for safety.  She will benefit from aquatic physical therapy intervention to improve all areas of deficits and instruct on aquatic HEP encouraging a more frequent exercise regimen.   OBJECTIVE IMPAIRMENTS: decreased activity tolerance, decreased balance, decreased endurance, decreased mobility, decreased strength, dizziness, pain, and hypermobility.   ACTIVITY LIMITATIONS: carrying, lifting, bending, sitting, standing, squatting, stairs, and  locomotion level  PARTICIPATION LIMITATIONS: meal prep, cleaning, laundry, driving, shopping, community activity, occupation, and yard work  PERSONAL FACTORS: Behavior pattern, Past/current experiences, Time since onset of injury/illness/exacerbation, Transportation, and 1-2 comorbidities: See PmHx are also affecting patient's functional outcome.   REHAB POTENTIAL: Fair chronicity of condition  CLINICAL DECISION MAKING: Stable/uncomplicated  EVALUATION COMPLEXITY: Low   GOALS: Goals reviewed with patient? Yes  SHORT TERM GOALS: Target date: 10/25  Pt will tolerate full aquatic sessions consistently without increase in pain and with improving function to demonstrate good toleration and effectiveness of intervention.  Baseline: Goal status: INITIAL   2.  Pt will tolerate amb 5 consecutive minutes submerged to demonstrates progression of toleration to activity Baseline:  Goal status: INITIAL   LONG TERM GOALS: Target date: 10/10/24  Pt to improve on ODI by 13% to demonstrate statistically significant Improvement in function. (MCID 13-15%) Baseline:38/50= 76% Goal status: INITIAL  2.  Pt will report return to routine exercise regimen Baseline:  Goal status: INITIAL  3.  Pt will report decrease in pain by at least 50% for improved toleration to activity/quality of life and to demonstrate improved management of pain. Baseline:  Goal status: INITIAL  4.  Pt will be  indep with final HEP's (land and aquatic as appropriate) for continued management of condition Baseline:  Goal status: INITIAL  5.  Pt will tolerate full 30 minutes of aquatic exercise to demonstrate an improvement in activity toleration Baseline:  Goal status: INITIAL   PLAN:  PT FREQUENCY: 1-2x/week  PT DURATION: 6 weeks   PLANNED INTERVENTIONS:  97750- Physical Performance Testing, 97110-Therapeutic exercises, 97530- Therapeutic activity, 97112- Neuromuscular re-education, 97535- Self Care, 02859-  Manual therapy, [id number]- Gait training, [phone]- Aquatic Therapy, [phone] (1-2 muscles), [phone] (3+ muscles)- Dry Needling, Patient/Family education, Balance training, Stair training, Taping, Joint mobilization, DME instructions, Cryotherapy, and Moist heat.  PLAN FOR NEXT SESSION: aquatics only: general strengthening; endurance training; pain management   Ronal Foots) Damaris Abeln MPT 09/03/24 11:31 AM [facility] [address] [place], [place], [address] Phone: [phone]   Fax:  [phone]  For all possible CPT codes, reference the Planned Interventions line above.     Check all conditions that are expected to impact treatment: {Conditions expected to impact treatment:Musculoskeletal disorders   If treatment provided at initial evaluation, no treatment charged due to lack of authorization.      Addend Ronal Foots) Queen Abbett MPT 09/03/24 1:01 PM [facility] [address] [place], [place], [address] Phone: [phone]   Fax:  [phone]

## 2024-09-03 NOTE — Progress Notes (Unsigned)
   LILLETTE Ileana Collet, PhD, LAT, ATC acting as a scribe for Artist Lloyd, MD.  Rachel [name] is a 38 y.o. female who presents to [facility] today for 77-month f/u hEDS and dysautonomia. Pt was last seen by Dr. Lloyd on 08/05/24 and was referred to PT, completing 2 visits. Also advised on salt/fluid intake, exercise protocol, and to cont compression. Prescribed Vyvanse  and montelukast .  Today, pt reports she can't really tell much difference on the rx, but her mom noticed a better train of thought and less emotional. She is noticing as autumn begins, she is more fatigued, vision problems, feeling faint more frequently and more exercise intolerant. Yesterday she had 2 instances of urinary incontinence.   Pertinent review of systems: No fevers or chills  Relevant historical information: Gastroparesis   Exam:  BP 98/76   Pulse (!) 56   Ht 5' 2 (1.575 m)   Wt 108 lb (49 kg)   LMP  (LMP Unknown)   SpO2 100%   BMI 19.75 kg/m  General: Well Developed, well nourished, and in no acute distress.   MSK: Normal gait    Lab and Radiology Results No results found for this or any previous visit (from the past 72 hours). No results found.     Assessment and Plan: 38 y.o. 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  For the musculoskeletal pain from hypermobile Ehlers-Danlos syndrome she is getting started on physical therapy which I think can be helpful.  For abdominal discomfort this has been a large challenge.  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  Already taking H1 and H2 blockers and montelukast .  Add cromolyn  today.  ADHD and brain fog: Vyvanse  10 mg is a little bit helpful.  She about 7 leftover Vyvanse  tens.  Recommend she try increasing to 20 mg over the weekend and report on Monday.  If that seems to be an okay dose will keep it at 20 if it is not  good enough we will increase to 30 if it made her worse we will go back down to 10.   POTS: Continue salt and fluid and exercise.  We talked a bit about beta-blockers.  Consider that in the future.  We also talked about weight.  She was 108 pounds and with a BMI of 19.7.  She lost 1 pound since her last visit.  She does have calendar reminders to eat as she does not have much of a food drive normally.  Will keep an eye on this especially as Vyvanse  dose increases.   PDMP not reviewed this encounter. No orders of the defined types were placed in this encounter.  Meds ordered this encounter  Medications   cromolyn  (GASTROCROM ) 100 MG/5ML solution    Sig: Take 2.5 mLs (50 mg total) by mouth 4 (four) times daily -  before meals and at bedtime.    Dispense:  480 mL    Refill:  12     Discussed warning signs or symptoms. Please see discharge instructions. Patient expresses understanding.   The above documentation has been reviewed and is accurate and complete Artist Lloyd, M.D.

## 2024-09-04 ENCOUNTER — Ambulatory Visit: Admitting: Family Medicine

## 2024-09-04 VITALS — BP 98/76 | HR 56 | Ht 62.0 in | Wt 108.0 lb

## 2024-09-04 DIAGNOSIS — K588 Other irritable bowel syndrome: Secondary | ICD-10-CM | POA: Diagnosis not present

## 2024-09-04 DIAGNOSIS — R636 Underweight: Secondary | ICD-10-CM | POA: Diagnosis not present

## 2024-09-04 DIAGNOSIS — M2550 Pain in unspecified joint: Secondary | ICD-10-CM | POA: Diagnosis not present

## 2024-09-04 DIAGNOSIS — G901 Familial dysautonomia [Riley-Day]: Secondary | ICD-10-CM

## 2024-09-04 DIAGNOSIS — Q796 Ehlers-Danlos syndrome, unspecified: Secondary | ICD-10-CM | POA: Diagnosis not present

## 2024-09-04 DIAGNOSIS — F902 Attention-deficit hyperactivity disorder, combined type: Secondary | ICD-10-CM

## 2024-09-04 MED ORDER — CROMOLYN SODIUM 100 MG/5ML PO CONC: 50.0000 mg | Freq: Three times a day (TID) | ORAL | 12 refills | Status: DC

## 2024-09-04 NOTE — Patient Instructions (Addendum)
 Thank you for coming in today.   Add the cromlyn.   Double the vyvanse  to 20mg  over the weekend and let me know on Monday how it went.   Recheck in 1 month.   Continue PT.

## 2024-09-05 NOTE — BH Specialist Note (Signed)
 Integrated Behavioral Health via Telemedicine Visit  09/08/2024 Rachel [name] [id number]  Number of Integrated Behavioral Health Clinician visits: Additional Visit  Session Start time: 1059   Session End time: 1217  Total time in minutes: 78  Referring Provider: Jerilynn Buddle, MD Patient/Family location: Home [facility] Provider location: [facility]  All persons participating in visit: [name] and [facility] Yadier Bramhall   Types of Service: Individual psychotherapy and Telephone visit  I connected with Rachel [name] [name] 's n/a via  Telephone or Video Enabled Telemedicine Application  (Video is Caregility application) and verified that I am speaking with the correct person using two identifiers. Discussed confidentiality: Yes   I discussed the limitations of telemedicine and the availability of in person appointments.  Discussed there is a possibility of technology failure and discussed alternative modes of communication if that failure occurs.  I discussed that engaging in this telemedicine visit, they consent to the provision of behavioral healthcare and the services will be billed under their insurance.  Patient and/or legal guardian expressed understanding and consented to Telemedicine visit: Yes   Presenting Concerns: Patient and/or family reports the following symptoms/concerns: Mixed emotions regarding recent recovered memories of childhood/teen traumas; how that has impacted her in the past up to current time.  Duration of problem: Recent memory; ongoing symptoms; Severity of problem: moderately severe  Patient and/or Family's Strengths/Protective Factors: Social connections, Social and Emotional competence, and Sense of purpose  Goals Addressed: Patient will:  Reduce symptoms of: anxiety, depression, and stress    Demonstrate ability to: Increase  motivation to adhere to plan of care  Progress towards Goals: Ongoing    Interventions: Interventions utilized:  Motivational Interviewing and Supportive Reflection Standardized Assessments completed: Not Needed  Patient and/or Family Response: Patient agrees with treatment plan.  Clinical Assessment/Diagnosis  Post traumatic stress disorder (PTSD)  Attention deficit hyperactivity disorder (ADHD), unspecified ADHD type  Psychosocial stressors   Patient may benefit from continued therapeutic intervention    Plan: Follow up with behavioral health clinician on : Two weeks Behavioral recommendations:  -Continue taking Vyvanse  and hydroxyzine  as prescribed -Continue daily self-coping strategies as needed (time with supportive people in life; healthy self-care; healthy boundaries, self-advocacy for improved health; ongoing creative pursuits)  Referral(s): Integrated [employer] (In Clinic)  I discussed the assessment and treatment plan with the patient and/or parent/guardian. They were provided an opportunity to ask questions and all were answered. They agreed with the plan and demonstrated an understanding of the instructions.   They were advised to call back or seek an in-person evaluation if the symptoms worsen or if the condition fails to improve as anticipated.  Janine Reller C Trey Bebee, LCSW

## 2024-09-08 ENCOUNTER — Ambulatory Visit: Admitting: Clinical

## 2024-09-08 ENCOUNTER — Telehealth: Payer: Self-pay | Admitting: Family Medicine

## 2024-09-08 DIAGNOSIS — F4310 Post-traumatic stress disorder, unspecified: Secondary | ICD-10-CM

## 2024-09-08 DIAGNOSIS — F909 Attention-deficit hyperactivity disorder, unspecified type: Secondary | ICD-10-CM

## 2024-09-08 DIAGNOSIS — Z658 Other specified problems related to psychosocial circumstances: Secondary | ICD-10-CM

## 2024-09-08 NOTE — Telephone Encounter (Signed)
 Patient called stating that Dr Joane had her start doubling her Vyvanse . She began this on Friday. So far, she thinks it might be too soon to tell if there has been any change but she will need a refill sent in to be able to continue this dosage.  lisdexamfetamine (VYVANSE ) 10 MG capsule  *Currently take two a day  Pharmacy: [facility] [address].

## 2024-09-09 MED ORDER — LISDEXAMFETAMINE DIMESYLATE 20 MG PO CAPS: 20.0000 mg | ORAL_CAPSULE | Freq: Every day | ORAL | 0 refills | Status: DC

## 2024-09-09 NOTE — Telephone Encounter (Signed)
 Sent Vyvanse  20 mg pills to pharmacy.

## 2024-09-10 ENCOUNTER — Ambulatory Visit (HOSPITAL_BASED_OUTPATIENT_CLINIC_OR_DEPARTMENT_OTHER): Admitting: Physical Therapy

## 2024-09-10 ENCOUNTER — Encounter (HOSPITAL_BASED_OUTPATIENT_CLINIC_OR_DEPARTMENT_OTHER): Payer: Self-pay | Admitting: Physical Therapy

## 2024-09-10 DIAGNOSIS — M5459 Other low back pain: Secondary | ICD-10-CM | POA: Diagnosis not present

## 2024-09-10 DIAGNOSIS — M6281 Muscle weakness (generalized): Secondary | ICD-10-CM | POA: Diagnosis not present

## 2024-09-10 DIAGNOSIS — R2689 Other abnormalities of gait and mobility: Secondary | ICD-10-CM

## 2024-09-10 DIAGNOSIS — M791 Myalgia, unspecified site: Secondary | ICD-10-CM | POA: Diagnosis not present

## 2024-09-10 DIAGNOSIS — M2550 Pain in unspecified joint: Secondary | ICD-10-CM | POA: Diagnosis not present

## 2024-09-10 NOTE — Therapy (Signed)
 OUTPATIENT PHYSICAL THERAPY THORACOLUMBAR TREATMENT   Patient Name: Rachel [name] MRN: [id number] DOB:October 02, 1986, 38 y.o., female Today's Date: 09/10/2024  END OF SESSION:  PT End of Session - 09/10/24 0811     Visit Number 3    Date for Recertification  10/10/24    Authorization Type [facility]    Authorization - Visit Number 3    Authorization - Number of Visits 7    PT Start Time 0800    PT Stop Time 0840    PT Time Calculation (min) 40 min    Activity Tolerance Patient tolerated treatment well    Behavior During Therapy [facility] for tasks assessed/performed          Past Medical History:  Diagnosis Date   ADHD (attention deficit hyperactivity disorder)    Anemia    Anxiety    Asthma    Bipolar disorder (HCC)    Complication of anesthesia    slow to wake up    Delayed gastric emptying 02/23/2020   Depression    DUB (dysfunctional uterine bleeding) 02/23/2020   Eczema    Ehlers-Danlos syndrome    Endometriosis    Gastroparesis    GERD (gastroesophageal reflux disease)    Heart murmur    Hiatal hernia 04/06/2020   Myalgia 04/06/2020   Nausea & vomiting 09/22/2022   Parasitic infection 02/23/2020   Pneumonia    Poor appetite 02/23/2020   Recurrent upper respiratory infection (URI)    RUQ pain 02/23/2020   Vitamin D  deficiency    Weight loss 02/23/2020   Past Surgical History:  Procedure Laterality Date   barium study     COLONOSCOPY     CYSTOSCOPY N/A 07/12/2021   Procedure: CYSTOSCOPY;  Surgeon: Zina Jerilynn LABOR, MD;  Location: [facility] OR;  Service: Gynecology;  Laterality: N/A;   ESOPHAGOGASTRODUODENOSCOPY  2011   and a ph study as well.    LAPAROSCOPIC GASTRIC RESECTION N/A 01/22/2023   Procedure: LAPAROSCOPIC GASTRIC PEXY;  Surgeon: Lyndel Deward PARAS, MD;  Location: [facility];  Service: General;  Laterality: N/A;   LAPAROSCOPY N/A 01/26/2021   Procedure: LAPAROSCOPY DIAGNOSTIC;  Surgeon: Zina Jerilynn LABOR, MD;  Location: [facility];  Service: Gynecology;  Laterality: N/A;   NASAL SEPTUM SURGERY     SINOSCOPY     TONGUE FLAP     TOTAL LAPAROSCOPIC HYSTERECTOMY WITH SALPINGECTOMY Bilateral 07/12/2021   Procedure: TOTAL LAPAROSCOPIC HYSTERECTOMY WITH SALPINGECTOMY, OOPHORECTOMY;  Surgeon: Zina Jerilynn LABOR, MD;  Location: [facility] OR;  Service: Gynecology;  Laterality: Bilateral;   UPPER GASTROINTESTINAL ENDOSCOPY     WISDOM TOOTH EXTRACTION     Patient Active Problem List   Diagnosis Date Noted   Ehlers-Danlos syndrome 08/05/2024   Acute stress reaction 06/14/2023   Chronic migraine w/o aura w/o status migrainosus, not intractable 02/23/2023   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   Memory loss 12/25/2022   Paresthesia 12/25/2022   Other allergic rhinitis 12/04/2022   Allergic conjunctivitis of both eyes 12/04/2022   Mild intermittent asthma without complication 12/04/2022   Other atopic dermatitis 12/04/2022   Allergic reaction 12/04/2022   Rash and other nonspecific skin eruption 12/04/2022   Multiple drug allergies 12/04/2022   Other adverse food reactions, not elsewhere classified, subsequent encounter 12/04/2022   Abnormal CT of the abdomen 09/23/2022   Abdominal pain 09/22/2022   Asthma, chronic 09/22/2022   HLD (hyperlipidemia) 09/22/2022   Nausea & vomiting 09/22/2022   Iron  deficiency anemia 01/20/2022   Acute hyponatremia 01/18/2022  Pyelonephritis 01/18/2022   Protein-calorie malnutrition, severe 01/18/2022   sepsis secondary to pyelonephritis of left kidney 01/17/2022   Sepsis (HCC) 01/17/2022   Chronic abdominal pain 01/17/2022   Dyspnea 01/17/2022   Underweight 01/17/2022   Irritable bowel syndrome (IBS) 01/17/2022   Hypokalemia 01/17/2022   Vasomotor symptoms due to menopause 08/17/2021   S/P laparoscopic hysterectomy 07/12/2021   Preoperative exam for gynecologic surgery 06/20/2021   Draining postoperative wound 02/28/2021   Encounter for postoperative care 02/16/2021   Endometriosis  determined by laparoscopy    Pelvic pain in female 10/11/2020   Presence of subdermal contraceptive implant 10/11/2020   Dysmenorrhea 10/11/2020   Dyspareunia in female 10/11/2020   NSVT (nonsustained ventricular tachycardia) (HCC) 09/03/2020   Gastroparesis 08/18/2020   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   Palpitations 07/26/2020   Family history of connective tissue disease 07/26/2020   Myalgia 04/06/2020   Hiatal hernia 04/06/2020   Recurrent infections 02/23/2020   Weight loss 02/23/2020   Poor appetite 02/23/2020   RUQ pain 02/23/2020   Delayed gastric emptying 02/23/2020   DUB (dysfunctional uterine bleeding) 02/23/2020   Cervical radiculopathy 05/31/2018   Hyperalgesia 05/31/2018   Muscle weakness 03/13/2018   GERD (gastroesophageal reflux disease) 12/26/2017   ADHD (attention deficit hyperactivity disorder) 05/07/2012   Bipolar 1 disorder (HCC) 05/07/2012    PCP: Corrina Sabin MD  REFERRING PROVIDER:   Dolphus Reiter, MD    REFERRING DIAG:  M79.10 (ICD-10-CM) - Myalgia  M25.50 (ICD-10-CM) - Hypermobility arthralgia    Rationale for Evaluation and Treatment: Rehabilitation  THERAPY DIAG:  Muscle weakness (generalized)  Other low back pain  Other abnormalities of gait and mobility  ONSET DATE: Chronic  SUBJECTIVE:                                                                                                                                                                                           SUBJECTIVE STATEMENT: Pt reports stressors at home with multiple family members laid up with medical issues.  She reports 8/10 pain with increased shoulder and cervical component today.   Initial subjective Last year has been an nightmare. Mds think I have a autoimmune disease. Official dx with EDS and POTS. Dr Joane wants me back in aquatics because land puts me into my bed.  I have bee getting dizzy (due to POTS).  I have been able to come to the gym at  times at least 1 time a week (40 minutes treadmill, some machines).  I have not been able to come to the pool at the times that it is available  PERTINENT HISTORY:  Dr Deveshwar:Water  therapy/PT for hypermobility  and myofascial pain syndrome.   Dr Joane: Evaluate and Treat for hypermobility and polyarthralgia 1-2 times per week for 4-6 weeks.   Decrease pain, increase strength, flexibility, function, and range of motion.  Modalities may include, traction, ionto, phono, stim, and dry needling prn.  Gastroparesis, ADHD; bilpolar; hypermobility; Myofacial pain syndrome; EDS; dysautonomia   PAIN:  Are you having pain? Yes: NPRS scale: current 7/10; worst 9/10; least 6.5 Pain location: generally cervical spine neck shoulders and spine Pain description: Variable:achy, stiff painful sometimes radiating Aggravating factors: movement, upright sitting, standing Relieving factors: lying down; reclines, heating pad  PRECAUTIONS: None  RED FLAGS: None   WEIGHT BEARING RESTRICTIONS: No  FALLS:  Has patient fallen in last 6 months? Unknown  LIVING ENVIRONMENT:  Steps inside and outside   OCCUPATION: disabled  PLOF: Needs assistance with ADLs Variable  PATIENT GOALS: decrease pain, become more active  NEXT MD VISIT: as needed  OBJECTIVE:  Note: Objective measures were completed at Evaluation unless otherwise noted.   PATIENT SURVEYS:  ODI: 38/50= 76%  COGNITION: Overall cognitive status: Within functional limits for tasks assessed     SENSATION: generalized hyperalgesia   POSTURE: forward head  PALPATION: TTP throughout   LUMBAR ROM:   Hypermobile  LOWER EXTREMITY ROM:     Hypermobile  LOWER EXTREMITY MMT:    MMT Right eval Left eval  Hip flexion 4 4  Hip extension    Hip abduction    Hip adduction    Hip internal rotation    Hip external rotation    Knee flexion 4 4  Knee extension 4 4  Ankle dorsiflexion    Ankle plantarflexion    Ankle inversion     Ankle eversion     (Blank rows = not tested)   FUNCTIONAL TESTS:  Timed up and go (TUG): 18.86   4 stage balance:FT x 20s; Unable to complete semi-tandem; tandem; SLS GAIT: Distance walked: 400 ft Assistive device utilized: None Level of assistance: Complete Independence Comments: Gait slowed. Pause upon standing to ensure steadiness  TREATMENT  [facility] Adult PT Treatment:                                                DATE: 09/10/24 Pt seen for aquatic therapy today.  Treatment took place in water  3.5-4.75 ft in depth at the [facility] pool. Temp of water  was 91.  Pt entered/exited the pool via stairs using step through pattern with hand rail.   *walking forward, back and side stepping in 3.6 ft with ue support of solid noodle  - seated rest periods *seated balance on big noodle->core engagement pushing down with suspension above 15 s hold x 3 *PPT and hip hiking seated swing like on solid noodle *TrA set using 1/2 noodle wide stance x 8; staggered stances x 5 (some forearm discomfort which reduces with hand placement adjustment)  - squatted rest period *standing horizontal add/abd wide stance *[employer] [place] *cycling on straddled noodle; ue support corner wall hip add/abd    Pt requires the buoyancy and hydrostatic pressure of water  for support, and to offload joints by unweighting joint load by at least 50 % in navel deep water  and by at least 75-80% in chest to neck deep water .  Viscosity of the water  is needed for resistance of strengthening. Water  current perturbations provides challenge to  standing balance requiring increased core activation.                                                 PATIENT EDUCATION:  Education details: Discussed eval findings, rehab rationale, aquatic program progression/POC and pools in area. Patient is in agreement  Person educated: Patient Education method: Explanation Education comprehension: verbalized understanding  HOME EXERCISE  PROGRAM: From previous episode x 1 yr ago: Access Code: [email or web address]   ASSESSMENT:  CLINICAL IMPRESSION: Pt reports continued flare. She is having difficulty with transportation to sessions due to 2/3 family members with medical issues limiting her access to rides.  She is encouraged to seek out information on medicaid transportation as well as SCAT options.  She VU.  Pt progressed through exercises today with added time spent on shoulder/upper body movement patterns for ain relief.  She tolerates well with entire 40 mins engaged with varying engagement.  Pain reported to be unchanged.  Goals ongoing.        Initial Impression Patient is a 38 y.o. f who was seen today for physical therapy evaluation and treatment for hypomobility and myofacial pain syndrome. She is well known to this clinic having been seen multiple times in aquatics with similar dx.  She returns today with added dx of dysautonomia, POTS and possible autoimmune disorder. Patient presents with general pain limited deficits throughout shoulders and entire spine effecting endurance, activity tolerance, gait, balance, and functional mobility with ADL's. As indicated by her outcomes measure score, subjective information and objective testing she is having to modify all activities for safety.  She will benefit from aquatic physical therapy intervention to improve all areas of deficits and instruct on aquatic HEP encouraging a more frequent exercise regimen.   OBJECTIVE IMPAIRMENTS: decreased activity tolerance, decreased balance, decreased endurance, decreased mobility, decreased strength, dizziness, pain, and hypermobility.   ACTIVITY LIMITATIONS: carrying, lifting, bending, sitting, standing, squatting, stairs, and locomotion level  PARTICIPATION LIMITATIONS: meal prep, cleaning, laundry, driving, shopping, community activity, occupation, and yard work  PERSONAL FACTORS: Behavior pattern, Past/current experiences, Time since onset of  injury/illness/exacerbation, Transportation, and 1-2 comorbidities: See PmHx are also affecting patient's functional outcome.   REHAB POTENTIAL: Fair chronicity of condition  CLINICAL DECISION MAKING: Stable/uncomplicated  EVALUATION COMPLEXITY: Low   GOALS: Goals reviewed with patient? Yes  SHORT TERM GOALS: Target date: 10/25  Pt will tolerate full aquatic sessions consistently without increase in pain and with improving function to demonstrate good toleration and effectiveness of intervention.  Baseline: Goal status: INITIAL   2.  Pt will tolerate amb 5 consecutive minutes submerged to demonstrates progression of toleration to activity Baseline:  Goal status: INITIAL   LONG TERM GOALS: Target date: 10/10/24  Pt to improve on ODI by 13% to demonstrate statistically significant Improvement in function. (MCID 13-15%) Baseline:38/50= 76% Goal status: INITIAL  2.  Pt will report return to routine exercise regimen Baseline:  Goal status: INITIAL  3.  Pt will report decrease in pain by at least 50% for improved toleration to activity/quality of life and to demonstrate improved management of pain. Baseline:  Goal status: INITIAL  4.  Pt will be indep with final HEP's (land and aquatic as appropriate) for continued management of condition Baseline:  Goal status: INITIAL  5.  Pt will tolerate full 30 minutes of aquatic exercise to demonstrate an improvement in  activity toleration Baseline:  Goal status: INITIAL   PLAN:  PT FREQUENCY: 1-2x/week  PT DURATION: 6 weeks   PLANNED INTERVENTIONS:  97750- Physical Performance Testing, 97110-Therapeutic exercises, 97530- Therapeutic activity, 97112- Neuromuscular re-education, 97535- Self Care, [id number]- Manual therapy, [phone]- Gait training, [phone]- Aquatic Therapy, [phone] (1-2 muscles), [phone] (3+ muscles)- Dry Needling, Patient/Family education, Balance training, Stair training, Taping, Joint mobilization, DME instructions,  Cryotherapy, and Moist heat.  PLAN FOR NEXT SESSION: aquatics only: general strengthening; endurance training; pain management   Ronal Foots) Yuval Rubens MPT 09/10/24 8:39 AM [facility] [address] [place], [place], [address] Phone: [phone]   Fax:  [phone]  For all possible CPT codes, reference the Planned Interventions line above.     Check all conditions that are expected to impact treatment: {Conditions expected to impact treatment:Musculoskeletal disorders   If treatment provided at initial evaluation, no treatment charged due to lack of authorization.

## 2024-09-17 ENCOUNTER — Encounter (HOSPITAL_BASED_OUTPATIENT_CLINIC_OR_DEPARTMENT_OTHER): Payer: Self-pay | Admitting: Physical Therapy

## 2024-09-17 ENCOUNTER — Ambulatory Visit (HOSPITAL_BASED_OUTPATIENT_CLINIC_OR_DEPARTMENT_OTHER): Attending: Rheumatology | Admitting: Physical Therapy

## 2024-09-17 DIAGNOSIS — B999 Unspecified infectious disease: Secondary | ICD-10-CM | POA: Insufficient documentation

## 2024-09-17 DIAGNOSIS — G43709 Chronic migraine without aura, not intractable, without status migrainosus: Secondary | ICD-10-CM | POA: Insufficient documentation

## 2024-09-17 DIAGNOSIS — R2689 Other abnormalities of gait and mobility: Secondary | ICD-10-CM | POA: Diagnosis not present

## 2024-09-17 DIAGNOSIS — Z8619 Personal history of other infectious and parasitic diseases: Secondary | ICD-10-CM | POA: Diagnosis present

## 2024-09-17 DIAGNOSIS — R21 Rash and other nonspecific skin eruption: Secondary | ICD-10-CM | POA: Insufficient documentation

## 2024-09-17 DIAGNOSIS — R7689 Other specified abnormal immunological findings in serum: Secondary | ICD-10-CM | POA: Insufficient documentation

## 2024-09-17 DIAGNOSIS — J4520 Mild intermittent asthma, uncomplicated: Secondary | ICD-10-CM | POA: Insufficient documentation

## 2024-09-17 DIAGNOSIS — K449 Diaphragmatic hernia without obstruction or gangrene: Secondary | ICD-10-CM | POA: Insufficient documentation

## 2024-09-17 DIAGNOSIS — Z90710 Acquired absence of both cervix and uterus: Secondary | ICD-10-CM | POA: Diagnosis present

## 2024-09-17 DIAGNOSIS — K219 Gastro-esophageal reflux disease without esophagitis: Secondary | ICD-10-CM | POA: Insufficient documentation

## 2024-09-17 DIAGNOSIS — K582 Mixed irritable bowel syndrome: Secondary | ICD-10-CM | POA: Insufficient documentation

## 2024-09-17 DIAGNOSIS — R109 Unspecified abdominal pain: Secondary | ICD-10-CM | POA: Diagnosis not present

## 2024-09-17 DIAGNOSIS — R208 Other disturbances of skin sensation: Secondary | ICD-10-CM | POA: Diagnosis not present

## 2024-09-17 DIAGNOSIS — D508 Other iron deficiency anemias: Secondary | ICD-10-CM | POA: Insufficient documentation

## 2024-09-17 DIAGNOSIS — M791 Myalgia, unspecified site: Secondary | ICD-10-CM | POA: Diagnosis not present

## 2024-09-17 DIAGNOSIS — M6281 Muscle weakness (generalized): Secondary | ICD-10-CM | POA: Insufficient documentation

## 2024-09-17 DIAGNOSIS — L2089 Other atopic dermatitis: Secondary | ICD-10-CM | POA: Diagnosis not present

## 2024-09-17 DIAGNOSIS — F902 Attention-deficit hyperactivity disorder, combined type: Secondary | ICD-10-CM | POA: Diagnosis not present

## 2024-09-17 DIAGNOSIS — J3089 Other allergic rhinitis: Secondary | ICD-10-CM | POA: Insufficient documentation

## 2024-09-17 DIAGNOSIS — K30 Functional dyspepsia: Secondary | ICD-10-CM | POA: Diagnosis not present

## 2024-09-17 DIAGNOSIS — E782 Mixed hyperlipidemia: Secondary | ICD-10-CM | POA: Insufficient documentation

## 2024-09-17 DIAGNOSIS — M5459 Other low back pain: Secondary | ICD-10-CM | POA: Diagnosis not present

## 2024-09-17 DIAGNOSIS — M2550 Pain in unspecified joint: Secondary | ICD-10-CM | POA: Diagnosis not present

## 2024-09-17 DIAGNOSIS — Z889 Allergy status to unspecified drugs, medicaments and biological substances status: Secondary | ICD-10-CM | POA: Insufficient documentation

## 2024-09-17 DIAGNOSIS — F319 Bipolar disorder, unspecified: Secondary | ICD-10-CM | POA: Insufficient documentation

## 2024-09-17 DIAGNOSIS — G8929 Other chronic pain: Secondary | ICD-10-CM | POA: Diagnosis not present

## 2024-09-17 NOTE — Therapy (Signed)
 OUTPATIENT PHYSICAL THERAPY THORACOLUMBAR TREATMENT   Patient Name: Rachel [name] MRN: [id number] DOB:04/24/1986, 38 y.o., female Today's Date: 09/17/2024  END OF SESSION:  PT End of Session - 09/17/24 0850     Visit Number 4    Date for Recertification  10/10/24    Authorization Type [facility]    Authorization - Number of Visits 7    PT Start Time [id number]    PT Stop Time 0925    PT Time Calculation (min) 39 min    Activity Tolerance Patient tolerated treatment well    Behavior During Therapy [facility] for tasks assessed/performed          Past Medical History:  Diagnosis Date   ADHD (attention deficit hyperactivity disorder)    Anemia    Anxiety    Asthma    Bipolar disorder (HCC)    Complication of anesthesia    slow to wake up    Delayed gastric emptying 02/23/2020   Depression    DUB (dysfunctional uterine bleeding) 02/23/2020   Eczema    Ehlers-Danlos syndrome    Endometriosis    Gastroparesis    GERD (gastroesophageal reflux disease)    Heart murmur    Hiatal hernia 04/06/2020   Myalgia 04/06/2020   Nausea & vomiting 09/22/2022   Parasitic infection 02/23/2020   Pneumonia    Poor appetite 02/23/2020   Recurrent upper respiratory infection (URI)    RUQ pain 02/23/2020   Vitamin D  deficiency    Weight loss 02/23/2020   Past Surgical History:  Procedure Laterality Date   barium study     COLONOSCOPY     CYSTOSCOPY N/A 07/12/2021   Procedure: CYSTOSCOPY;  Surgeon: Zina Jerilynn LABOR, MD;  Location: [facility] OR;  Service: Gynecology;  Laterality: N/A;   ESOPHAGOGASTRODUODENOSCOPY  2011   and a ph study as well.    LAPAROSCOPIC GASTRIC RESECTION N/A 01/22/2023   Procedure: LAPAROSCOPIC GASTRIC PEXY;  Surgeon: Lyndel Deward PARAS, MD;  Location: [facility] ORS;  Service: General;  Laterality: N/A;   LAPAROSCOPY N/A 01/26/2021   Procedure: LAPAROSCOPY DIAGNOSTIC;  Surgeon: Zina Jerilynn LABOR, MD;  Location: [facility];  Service: Gynecology;   Laterality: N/A;   NASAL SEPTUM SURGERY     SINOSCOPY     TONGUE FLAP     TOTAL LAPAROSCOPIC HYSTERECTOMY WITH SALPINGECTOMY Bilateral 07/12/2021   Procedure: TOTAL LAPAROSCOPIC HYSTERECTOMY WITH SALPINGECTOMY, OOPHORECTOMY;  Surgeon: Zina Jerilynn LABOR, MD;  Location: [facility] OR;  Service: Gynecology;  Laterality: Bilateral;   UPPER GASTROINTESTINAL ENDOSCOPY     WISDOM TOOTH EXTRACTION     Patient Active Problem List   Diagnosis Date Noted   Ehlers-Danlos syndrome 08/05/2024   Acute stress reaction 06/14/2023   Chronic migraine w/o aura w/o status migrainosus, not intractable 02/23/2023   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   Memory loss 12/25/2022   Paresthesia 12/25/2022   Other allergic rhinitis 12/04/2022   Allergic conjunctivitis of both eyes 12/04/2022   Mild intermittent asthma without complication 12/04/2022   Other atopic dermatitis 12/04/2022   Allergic reaction 12/04/2022   Rash and other nonspecific skin eruption 12/04/2022   Multiple drug allergies 12/04/2022   Other adverse food reactions, not elsewhere classified, subsequent encounter 12/04/2022   Abnormal CT of the abdomen 09/23/2022   Abdominal pain 09/22/2022   Asthma, chronic 09/22/2022   HLD (hyperlipidemia) 09/22/2022   Nausea & vomiting 09/22/2022   Iron  deficiency anemia 01/20/2022   Acute hyponatremia 01/18/2022   Pyelonephritis 01/18/2022   Protein-calorie malnutrition, severe  01/18/2022   sepsis secondary to pyelonephritis of left kidney 01/17/2022   Sepsis (HCC) 01/17/2022   Chronic abdominal pain 01/17/2022   Dyspnea 01/17/2022   Underweight 01/17/2022   Irritable bowel syndrome (IBS) 01/17/2022   Hypokalemia 01/17/2022   Vasomotor symptoms due to menopause 08/17/2021   S/P laparoscopic hysterectomy 07/12/2021   Preoperative exam for gynecologic surgery 06/20/2021   Draining postoperative wound 02/28/2021   Encounter for postoperative care 02/16/2021   Endometriosis determined by laparoscopy    Pelvic  pain in female 10/11/2020   Presence of subdermal contraceptive implant 10/11/2020   Dysmenorrhea 10/11/2020   Dyspareunia in female 10/11/2020   NSVT (nonsustained ventricular tachycardia) (HCC) 09/03/2020   Gastroparesis 08/18/2020   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   Palpitations 07/26/2020   Family history of connective tissue disease 07/26/2020   Myalgia 04/06/2020   Hiatal hernia 04/06/2020   Recurrent infections 02/23/2020   Weight loss 02/23/2020   Poor appetite 02/23/2020   RUQ pain 02/23/2020   Delayed gastric emptying 02/23/2020   DUB (dysfunctional uterine bleeding) 02/23/2020   Cervical radiculopathy 05/31/2018   Hyperalgesia 05/31/2018   Muscle weakness 03/13/2018   GERD (gastroesophageal reflux disease) 12/26/2017   ADHD (attention deficit hyperactivity disorder) 05/07/2012   Bipolar 1 disorder (HCC) 05/07/2012    PCP: Corrina Sabin MD  REFERRING PROVIDER:   Dolphus Reiter, MD    REFERRING DIAG:  M79.10 (ICD-10-CM) - Myalgia  M25.50 (ICD-10-CM) - Hypermobility arthralgia    Rationale for Evaluation and Treatment: Rehabilitation  THERAPY DIAG:  Muscle weakness (generalized)  Other low back pain  Other abnormalities of gait and mobility  ONSET DATE: Chronic  SUBJECTIVE:                                                                                                                                                                                           SUBJECTIVE STATEMENT: Pt reports increased pain 7.5/10. She reports normal response to therapy last session with some soreness and fatigue  Initial subjective Last year has been an nightmare. Mds think I have a autoimmune disease. Official dx with EDS and POTS. Dr Joane wants me back in aquatics because land puts me into my bed.  I have bee getting dizzy (due to POTS).  I have been able to come to the gym at times at least 1 time a week (40 minutes treadmill, some machines).  I have not been  able to come to the pool at the times that it is available  PERTINENT HISTORY:  Dr Deveshwar:Water  therapy/PT for hypermobility and myofascial pain syndrome.   Dr Joane: Evaluate and Treat for hypermobility and polyarthralgia 1-2  times per week for 4-6 weeks.   Decrease pain, increase strength, flexibility, function, and range of motion.  Modalities may include, traction, ionto, phono, stim, and dry needling prn.  Gastroparesis, ADHD; bilpolar; hypermobility; Myofacial pain syndrome; EDS; dysautonomia   PAIN:  Are you having pain? Yes: NPRS scale: current 7/10; worst 9/10; least 6.5 Pain location: generally cervical spine neck shoulders and spine Pain description: Variable:achy, stiff painful sometimes radiating Aggravating factors: movement, upright sitting, standing Relieving factors: lying down; reclines, heating pad  PRECAUTIONS: None  RED FLAGS: None   WEIGHT BEARING RESTRICTIONS: No  FALLS:  Has patient fallen in last 6 months? Unknown  LIVING ENVIRONMENT:  Steps inside and outside   OCCUPATION: disabled  PLOF: Needs assistance with ADLs Variable  PATIENT GOALS: decrease pain, become more active  NEXT MD VISIT: as needed  OBJECTIVE:  Note: Objective measures were completed at Evaluation unless otherwise noted.   PATIENT SURVEYS:  ODI: 38/50= 76%  COGNITION: Overall cognitive status: Within functional limits for tasks assessed     SENSATION: generalized hyperalgesia   POSTURE: forward head  PALPATION: TTP throughout   LUMBAR ROM:   Hypermobile  LOWER EXTREMITY ROM:     Hypermobile  LOWER EXTREMITY MMT:    MMT Right eval Left eval  Hip flexion 4 4  Hip extension    Hip abduction    Hip adduction    Hip internal rotation    Hip external rotation    Knee flexion 4 4  Knee extension 4 4  Ankle dorsiflexion    Ankle plantarflexion    Ankle inversion    Ankle eversion     (Blank rows = not tested)   FUNCTIONAL TESTS:  Timed up and  go (TUG): 18.86   4 stage balance:FT x 20s; Unable to complete semi-tandem; tandem; SLS GAIT: Distance walked: 400 ft Assistive device utilized: None Level of assistance: Complete Independence Comments: Gait slowed. Pause upon standing to ensure steadiness  TREATMENT  [facility] Adult PT Treatment:                                                DATE: 09/17/24 Pt seen for aquatic therapy today.  Treatment took place in water  3.5-4.75 ft in depth at the [facility] pool. Temp of water  was 91.  Pt entered/exited the pool via stairs using step through pattern with hand rail.   *walking forward, back and side stepping in 3.6 ft with ue support of solid noodle  - seated rest periods *PPT and hip hiking seated swing like on solid noodle  - squatted rest period *seated balance on solid noodle->core engagement pushing down with suspension above 15 s hold x 3 *TrA set using 1/2 noodle wide stance x 8; staggered stances x 5   - squatted rest period *Standing ue support RBHB: toe raises; heel raises (not tolerated); hip add/abd x 5 *standing horizontal add/abd wide stance *[place] *cycling on straddled noodle; ue support corner wall hip add/abd    Pt requires the buoyancy and hydrostatic pressure of water  for support, and to offload joints by unweighting joint load by at least 50 % in navel deep water  and by at least 75-80% in chest to neck deep water .  Viscosity of the water  is needed for resistance of strengthening. Water  current perturbations provides challenge to standing balance requiring increased core  activation.                                                 PATIENT EDUCATION:  Education details: Discussed eval findings, rehab rationale, aquatic program progression/POC and pools in area. Patient is in agreement  Person educated: Patient Education method: Explanation Education comprehension: verbalized understanding  HOME EXERCISE PROGRAM: From previous episode x 1 yr ago:  Access Code: [email or web address]   ASSESSMENT:  CLINICAL IMPRESSION: Pt tolerates some progression of therapy today.  She continues to require rest periods between ea exercise. VC provided for mindfulness of movements and maintaining proper positioning with core engaged. She reports her hips, knees and ankle are areas of increased discomfort today. With rest periods pt able to complete 40 minutes of aquatic engagement.  Goals ongoing. If time allows address STG #2 next session.   Initial Impression Patient is a 37 y.o. f who was seen today for physical therapy evaluation and treatment for hypomobility and myofacial pain syndrome. She is well known to this clinic having been seen multiple times in aquatics with similar dx.  She returns today with added dx of dysautonomia, POTS and possible autoimmune disorder. Patient presents with general pain limited deficits throughout shoulders and entire spine effecting endurance, activity tolerance, gait, balance, and functional mobility with ADL's. As indicated by her outcomes measure score, subjective information and objective testing she is having to modify all activities for safety.  She will benefit from aquatic physical therapy intervention to improve all areas of deficits and instruct on aquatic HEP encouraging a more frequent exercise regimen.   OBJECTIVE IMPAIRMENTS: decreased activity tolerance, decreased balance, decreased endurance, decreased mobility, decreased strength, dizziness, pain, and hypermobility.   ACTIVITY LIMITATIONS: carrying, lifting, bending, sitting, standing, squatting, stairs, and locomotion level  PARTICIPATION LIMITATIONS: meal prep, cleaning, laundry, driving, shopping, community activity, occupation, and yard work  PERSONAL FACTORS: Behavior pattern, Past/current experiences, Time since onset of injury/illness/exacerbation, Transportation, and 1-2 comorbidities: See PmHx are also affecting patient's functional outcome.   REHAB  POTENTIAL: Fair chronicity of condition  CLINICAL DECISION MAKING: Stable/uncomplicated  EVALUATION COMPLEXITY: Low   GOALS: Goals reviewed with patient? Yes  SHORT TERM GOALS: Target date: 10/25  Pt will tolerate full aquatic sessions consistently without increase in pain and with improving function to demonstrate good toleration and effectiveness of intervention.  Baseline: Goal status:Met 09/17/24   2.  Pt will tolerate amb 5 consecutive minutes submerged to demonstrates progression of toleration to activity Baseline:  Goal status: in progress 09/17/24   LONG TERM GOALS: Target date: 10/10/24  Pt to improve on ODI by 13% to demonstrate statistically significant Improvement in function. (MCID 13-15%) Baseline:38/50= 76% Goal status: INITIAL  2.  Pt will report return to routine exercise regimen Baseline:  Goal status: INITIAL  3.  Pt will report decrease in pain by at least 50% for improved toleration to activity/quality of life and to demonstrate improved management of pain. Baseline:  Goal status: INITIAL  4.  Pt will be indep with final HEP's (land and aquatic as appropriate) for continued management of condition Baseline:  Goal status: INITIAL  5.  Pt will tolerate full 30 minutes of aquatic exercise to demonstrate an improvement in activity toleration Baseline:  Goal status: INITIAL   PLAN:  PT FREQUENCY: 1-2x/week  PT DURATION: 6 weeks   PLANNED INTERVENTIONS:  97750-  Physical Performance Testing, 97110-Therapeutic exercises, 97530- Therapeutic activity, [id number]- Neuromuscular re-education, [phone]- Self Care, 02859- Manual therapy, [phone]- Gait training, [phone]- Aquatic Therapy, [phone] (1-2 muscles), [phone] (3+ muscles)- Dry Needling, Patient/Family education, Balance training, Stair training, Taping, Joint mobilization, DME instructions, Cryotherapy, and Moist heat.  PLAN FOR NEXT SESSION: aquatics only: general strengthening; endurance training; pain  management   Ronal Foots) Milas Schappell MPT 09/17/24 8:53 AM [facility] [address] [place], [place], [address] Phone: [phone]   Fax:  [phone]  For all possible CPT codes, reference the Planned Interventions line above.     Check all conditions that are expected to impact treatment: {Conditions expected to impact treatment:Musculoskeletal disorders   If treatment provided at initial evaluation, no treatment charged due to lack of authorization.

## 2024-09-22 ENCOUNTER — Telehealth: Payer: Self-pay | Admitting: Clinical

## 2024-09-22 ENCOUNTER — Other Ambulatory Visit: Payer: Self-pay | Admitting: Gastroenterology

## 2024-09-22 ENCOUNTER — Ambulatory Visit (HOSPITAL_BASED_OUTPATIENT_CLINIC_OR_DEPARTMENT_OTHER): Admitting: Physical Therapy

## 2024-09-22 ENCOUNTER — Telehealth: Payer: Self-pay | Admitting: Family Medicine

## 2024-09-22 NOTE — Telephone Encounter (Signed)
 Please contact patient and get her an appointment with me sooner than December 1.

## 2024-09-22 NOTE — Telephone Encounter (Signed)
Appointment has been moved up.

## 2024-09-22 NOTE — Telephone Encounter (Signed)
 I called the patient to schedule a sooner appointment as requested by Dr Joane and her [facility] therapist.  She will be losing her Medicaid coverage on 10/12/2024 so we moved her appointment to 10/07/2024 so it would be after visit with Dr [place].   She asked if Dr Joane would be able to help her with paperwork in pleading her case to Medicaid to continue her coverage due to her disability?   ** She also wanted to let Dr Joane know that the Cromolyn  liquid that she was prescribed is too much liquid for her stomach to process.

## 2024-09-22 NOTE — Telephone Encounter (Signed)
 Can we reach out to pt to schedule appt prior to 10/13/24 - per Dr. Joane.

## 2024-09-22 NOTE — Telephone Encounter (Unsigned)
 Copied from CRM [id number]. Topic: General - Billing Inquiry >> Sep 22, 2024 12:45 PM Donee H wrote: Reason for CRM: Patient called regarding issue with her Medicaid insurance. She wanted to know if her pcp can help advocate for her with applying for an [employer]. She didn't know if pcp helps with this or will be willing to help.

## 2024-09-22 NOTE — Telephone Encounter (Signed)
 Returned pt call; pt is considerably distressed with panic attacks, after receiving a letter today that her Medicaid will be terminated on 10/12/24, due to income too high, in spite of pt not being able to work, due to health issues, for years.   Pt is advised to do the following:  Appeal Medicaid decision (follow paperwork instruction) Contact PCP office to set up appointment with financial counselor, to help with orange card application (today) Contact Sports Medicine office regarding any sooner appointments available, as appointment is 10/13/24, one day after Medicaid termination.  (Pt verbally consents to [facility] contacting Sports Medicine provider, to let him know what's going on).  When government shut down ends, begin disability process Attend [facility] appointment tomorrow, 09/23/24 at 10:45am to discuss further.

## 2024-09-23 ENCOUNTER — Ambulatory Visit (INDEPENDENT_AMBULATORY_CARE_PROVIDER_SITE_OTHER): Admitting: Clinical

## 2024-09-23 DIAGNOSIS — Z658 Other specified problems related to psychosocial circumstances: Secondary | ICD-10-CM

## 2024-09-23 DIAGNOSIS — F909 Attention-deficit hyperactivity disorder, unspecified type: Secondary | ICD-10-CM

## 2024-09-23 DIAGNOSIS — F430 Acute stress reaction: Secondary | ICD-10-CM

## 2024-09-23 DIAGNOSIS — F4310 Post-traumatic stress disorder, unspecified: Secondary | ICD-10-CM

## 2024-09-23 NOTE — Progress Notes (Signed)
 Office Visit Note  Patient: Rachel [name]             Date of Birth: [birth year]           MRN: [id number]             PCP: Delbert Clam, MD Referring: Delbert Clam, MD Visit Date: 10/07/2024 Occupation: Data Unavailable  Subjective:  Generalized pain and fatigue  History of Present Illness: Rachel [name] is a 38 y.o. female with positive ANA, hypermobility, myofascial pain.  She returns today after her last visit on July 29, 2024.  She states she continues to have fatigue, oral ulcers, nasal ulcers, dry mouth, Raynaud's phenomenon, rashes, hair loss and photosensitivity.  She states all of her joints and muscles are still swollen.  She states she has a lot of fluid retention.  She went to see Dr. Alm for evaluation of rash but did not have rash at the time.    Activities of Daily Living:  Patient reports morning stiffness for all day. Patient Reports nocturnal pain.  Difficulty dressing/grooming: Reports Difficulty climbing stairs: Reports Difficulty getting out of chair: Reports Difficulty using hands for taps, buttons, cutlery, and/or writing: Reports  Review of Systems  Constitutional:  Positive for fatigue and fever.  HENT:  Positive for mouth sores and mouth dryness.   Eyes:  Positive for photophobia, pain, visual disturbance and dryness.  Respiratory:  Positive for shortness of breath.   Cardiovascular:  Positive for chest pain and palpitations.  Gastrointestinal:  Positive for blood in stool, constipation and diarrhea.  Endocrine: Positive for heat intolerance and increased urination.  Genitourinary:  Positive for involuntary urination.  Musculoskeletal:  Positive for joint pain, gait problem, joint pain, joint swelling, myalgias, muscle weakness, morning stiffness, muscle tenderness and myalgias.  Skin:  Positive for color change, rash, hair loss and sensitivity to sunlight.  Allergic/Immunologic: Positive for susceptible to  infections.  Neurological:  Positive for dizziness, numbness and headaches.  Hematological:  Positive for swollen glands.  Psychiatric/Behavioral:  Positive for confusion and sleep disturbance. Negative for depressed mood. The patient is nervous/anxious.     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   Memory loss 12/25/2022   Paresthesia 12/25/2022   Other allergic rhinitis 12/04/2022   Allergic conjunctivitis of both eyes 12/04/2022   Mild intermittent asthma without complication 12/04/2022   Other atopic dermatitis 12/04/2022   Allergic reaction 12/04/2022   Rash and other nonspecific skin eruption 12/04/2022   Multiple drug allergies 12/04/2022   Other adverse food reactions, not elsewhere classified, subsequent encounter 12/04/2022   Abnormal CT of the abdomen 09/23/2022   Abdominal pain 09/22/2022   Asthma, chronic 09/22/2022   HLD (hyperlipidemia) 09/22/2022   Nausea & vomiting 09/22/2022   Iron  deficiency anemia 01/20/2022   Acute hyponatremia 01/18/2022   Pyelonephritis 01/18/2022   Protein-calorie malnutrition, severe 01/18/2022   sepsis secondary to pyelonephritis of left kidney 01/17/2022   Sepsis (HCC) 01/17/2022   Chronic abdominal pain 01/17/2022   Dyspnea 01/17/2022   Underweight 01/17/2022   Irritable bowel syndrome (IBS) 01/17/2022   Hypokalemia 01/17/2022   Vasomotor symptoms due to menopause 08/17/2021   S/P laparoscopic hysterectomy 07/12/2021   Preoperative exam for gynecologic surgery 06/20/2021   Draining postoperative wound 02/28/2021   Encounter for postoperative care 02/16/2021  Endometriosis determined by laparoscopy    Pelvic pain in female 10/11/2020   Presence of subdermal contraceptive implant 10/11/2020   Dysmenorrhea 10/11/2020    Dyspareunia in female 10/11/2020   NSVT (nonsustained ventricular tachycardia) (HCC) 09/03/2020   Gastroparesis 08/18/2020   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   Palpitations 07/26/2020   Family history of connective tissue disease 07/26/2020   Myalgia 04/06/2020   Hiatal hernia 04/06/2020   Recurrent infections 02/23/2020   Weight loss 02/23/2020   Poor appetite 02/23/2020   RUQ pain 02/23/2020   Delayed gastric emptying 02/23/2020   DUB (dysfunctional uterine bleeding) 02/23/2020   Cervical radiculopathy 05/31/2018   Hyperalgesia 05/31/2018   Muscle weakness 03/13/2018   GERD (gastroesophageal reflux disease) 12/26/2017   ADHD (attention deficit hyperactivity disorder) 05/07/2012   Bipolar 1 disorder (HCC) 05/07/2012    Past Medical History:  Diagnosis Date   ADHD (attention deficit hyperactivity disorder)    Anemia    Anxiety    Asthma    Bipolar disorder (HCC)    Complication of anesthesia    slow to wake up    Delayed gastric emptying 02/23/2020   Depression    DUB (dysfunctional uterine bleeding) 02/23/2020   Eczema    Ehlers-Danlos syndrome    Endometriosis    Gastroparesis    GERD (gastroesophageal reflux disease)    Heart murmur    Hiatal hernia 04/06/2020   Myalgia 04/06/2020   Nausea & vomiting 09/22/2022   Parasitic infection 02/23/2020   Pneumonia    Poor appetite 02/23/2020   Recurrent upper respiratory infection (URI)    RUQ pain 02/23/2020   Vitamin D  deficiency    Weight loss 02/23/2020    Family History  Problem Relation Age of Onset   Melanoma Mother    Eczema Mother    Urticaria Mother    Allergic rhinitis Mother    Colon polyps Mother    Depression Mother    Anxiety disorder Mother    Hypertension Mother    Hyperlipidemia Mother    Asthma Mother    Endometriosis Mother    Fibroids Mother    Fibromyalgia Mother    Migraines Mother    Irritable bowel syndrome Mother    Allergic rhinitis Father    Depression Father     Hypertension Father    Hyperlipidemia Father    Bipolar disorder Father    Melanoma Sister    Urticaria Sister    Allergic rhinitis Sister    Bipolar disorder Sister    ADD / ADHD Sister    Asthma Sister    Endometriosis Sister    ADD / ADHD Sister    Seizures Sister    Anxiety disorder Sister    Heart disease Maternal Grandmother    Endometriosis Maternal Grandmother    Fibroids Maternal Grandmother    Hyperlipidemia Maternal Grandmother    Hypertension Maternal Grandmother    Diabetes Maternal Grandmother    Fibromyalgia Maternal Grandmother    Heart disease Maternal Grandfather    Diabetes Maternal Grandfather    Heart disease Paternal Grandmother    Heart disease Paternal Grandfather    Colon cancer Maternal Great-grandmother    Pancreatic cancer Neg Hx    Esophageal cancer Neg Hx    Stomach cancer Neg Hx    Past Surgical History:  Procedure Laterality Date   barium study     COLONOSCOPY     CYSTOSCOPY N/A 07/12/2021   Procedure: CYSTOSCOPY;  Surgeon: Zina Jerilynn LABOR, MD;  Location: [facility];  Service: Gynecology;  Laterality: N/A;   ESOPHAGOGASTRODUODENOSCOPY  2011   and a ph study as well.    LAPAROSCOPIC GASTRIC RESECTION N/A 01/22/2023   Procedure: LAPAROSCOPIC GASTRIC PEXY;  Surgeon: Lyndel Deward PARAS, MD;  Location: [facility];  Service: General;  Laterality: N/A;   LAPAROSCOPY N/A 01/26/2021   Procedure: LAPAROSCOPY DIAGNOSTIC;  Surgeon: Zina Jerilynn LABOR, MD;  Location: [facility];  Service: Gynecology;  Laterality: N/A;   NASAL SEPTUM SURGERY     SINOSCOPY     TONGUE FLAP     TOTAL LAPAROSCOPIC HYSTERECTOMY WITH SALPINGECTOMY Bilateral 07/12/2021   Procedure: TOTAL LAPAROSCOPIC HYSTERECTOMY WITH SALPINGECTOMY, OOPHORECTOMY;  Surgeon: Zina Jerilynn LABOR, MD;  Location: [facility];  Service: Gynecology;  Laterality: Bilateral;   UPPER GASTROINTESTINAL ENDOSCOPY     WISDOM TOOTH EXTRACTION     Social History   Tobacco Use   Smoking status: Never     Passive exposure: Current (minimal)   Smokeless tobacco: Never  Vaping Use   Vaping status: Never Used  Substance Use Topics   Alcohol use: Yes    Alcohol/week: 1.0 standard drink of alcohol    Types: 1 Glasses of wine per week    Comment: rare   Drug use: No   Social History   Social History Narrative   Not on file     Immunization History  Administered Date(s) Administered   Influenza, Seasonal, Injecte, Preservative Fre 07/28/2024   Influenza,inj,Quad PF,6+ Mos 07/14/2020, 08/02/2022, 08/02/2022   PFIZER(Purple Top)SARS-COV-2 Vaccination 02/19/2020, 03/17/2020, 09/11/2020   Pfizer Covid-19 Vaccine Bivalent Booster 23yrs & up 09/15/2021   Tdap 01/07/2021     Objective: Vital Signs: BP 118/82   Pulse 82   Temp 98 F (36.7 C)   Resp 15   Ht 5' 1.81 (1.57 m)   Wt 110 lb (49.9 kg) Comment: patient reports weight of 103lbs on 10/06/2024  LMP  (LMP Unknown)   BMI 20.24 kg/m    Physical Exam Vitals and nursing note reviewed.  Constitutional:      Appearance: She is well-developed.  HENT:     Head: Normocephalic and atraumatic.  Eyes:     Conjunctiva/sclera: Conjunctivae normal.  Cardiovascular:     Rate and Rhythm: Normal rate and regular rhythm.     Heart sounds: Normal heart sounds.  Pulmonary:     Effort: Pulmonary effort is normal.     Breath sounds: Normal breath sounds.  Abdominal:     General: Bowel sounds are normal.     Palpations: Abdomen is soft.  Musculoskeletal:     Cervical back: Normal range of motion.  Lymphadenopathy:     Cervical: No cervical adenopathy.  Skin:    General: Skin is warm and dry.     Capillary Refill: Capillary refill takes less than 2 seconds.  Neurological:     Mental Status: She is alert and oriented to person, place, and time.  Psychiatric:        Behavior: Behavior normal.      Musculoskeletal Exam: Cervical, thoracic and lumbar spine were in good range of motion.  There was no SI joint tenderness.  Shoulder  joints, elbow joints, wrist joints, MCPs, PIPs and DIPs were in good range of motion with no synovitis.  Hip joints and knee joints were in good range of motion without any warmth swelling or effusion.  There was no tenderness over ankles or MTPs.   CDAI Exam: CDAI Score: -- Patient Global: --; Provider Global: -- Swollen: --;  Tender: -- Joint Exam 10/07/2024   No joint exam has been documented for this visit   There is currently no information documented on the homunculus. Go to the Rheumatology activity and complete the homunculus joint exam.  Investigation: No additional findings.  Imaging: No results found.  Recent Labs: Lab Results  Component Value Date   WBC 6.2 05/11/2024   HGB 13.2 05/11/2024   PLT 213 05/11/2024   NA 140 05/11/2024   K 3.9 07/28/2024   CL 106 05/11/2024   CO2 25 05/11/2024   GLUCOSE 86 05/11/2024   BUN 12 05/11/2024   CREATININE 0.87 05/11/2024   BILITOT 0.3 05/11/2024   ALKPHOS 55 05/11/2024   AST 23 05/11/2024   ALT 16 05/11/2024   PROT 6.9 05/11/2024   ALBUMIN 4.2 05/11/2024   CALCIUM  9.8 05/11/2024   GFRAA 128 02/04/2020   July 29, 2024 ANA 1: 320 NS, ENA panel negative, C3-C4 normal, anticardiolipin negative, beta-2  GP 1 negative, thyroglobulin antibody negative, TPO antibody negative, July 30, 2024 urine protein creatinine ratio normal  Speciality Comments: No specialty comments available.  Procedures:  No procedures performed Allergies: Amitriptyline , Amphetamine-dextroamphetamine, Lupron [leuprolide], Orilissa  [elagolix], Penicillins, Wound dressing adhesive, Betadine  [povidone-iodine ], Chamomile, Chlorhexidine , Dandelion, Doxycycline, Mixed ragweed, Olanzapine, Other, Porcine (pork) protein-containing drug products, Prozac [fluoxetine hcl], Stevia glycerite extract [flavoring agent (non-screening)], Tape, Triple antibiotic pain relief [neomy-bacit-polymyx-pramoxine], and Reglan  [metoclopramide ]   Assessment / Plan:      Visit Diagnoses: Positive ANA (antinuclear antibody) - July 29, 2024 ANA 1: 320 NS, ENA panel negative, C3-C4 normal, anticardiolipin negative, beta-2  GP 1 negative, thyroglobulin antibody negative, TPO antibody negative, July 30, 2024 urine protein creatinine ratio normal.  Lab results were reviewed with the patient and her mother.  Patient states that she has had positive ANA for many years.  She gives history of fatigue, oral ulcers, dry eyes, hair , myalgias and arthralgias.  Questionable history of Raynauds.  Patient states the last nasal ulcer was 2 weeks ago.  No oral ulcers are noted on the examination.  No synovitis was noted on the examination.  She had generalized hyperalgesia and positive tender points.  No rash was noted on the examination.  I did detailed discussion with the patient about positive ANA and his prevalence in normal population.  However I also discussed that people with positive ANA may develop autoimmune disease later in a close observation is recommended.  Patient wanted to know the treatment options in case she has an autoimmune disease.  I did detailed discussion with the patient regarding possible use of hydroxychloroquine.  Side effects of hydroxychloroquine were discussed at length with the patient and her mother.  Patient would like to hold off for now.  Patient wants to get a second opinion.  I will refer her for second opinion.  Myalgia -she continues to have pain and discomfort in her muscles.  No muscle weakness was noted.  Possible myofascial pain syndrome.  I discussed water  aerobics, swimming and regular exercise and yoga.  Hyperalgesia-she continues to have generalized hyperalgesia.  Hypermobility arthralgia -patient was evaluated by Dr. Joane for hypermobility.  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  Rash-patient gives history of intermittent rash.  She was evaluated by Dr. Alm but did not have any active  rash at the time.  I advised her to follow-up with Dr. Alm if she develops rash.  Other medical problems are listed as follows:  Mixed hyperlipidemia  Mild intermittent asthma without complication  Irritable bowel syndrome with both constipation and diarrhea  Hiatal hernia  Chronic abdominal pain  Gastroesophageal reflux disease without esophagitis  Delayed gastric emptying  Chronic migraine w/o aura w/o status migrainosus, not intractable  Bipolar 1 disorder (HCC)  Other iron  deficiency anemia  Attention deficit hyperactivity disorder (ADHD), combined type  Other atopic dermatitis  Other allergic rhinitis  Multiple drug allergies  Recurrent infections  History of sepsis  Status post hysterectomy  Orders: Orders Placed This Encounter  Procedures   Ambulatory referral to Rheumatology   No orders of the defined types were placed in this encounter.  Follow-Up Instructions: Return in about 6 months (around 04/06/2025) for +ANA.   Maya Nash, MD  Note - This record has been created using Animal nutritionist.  Chart creation errors have been sought, but may not always  have been located. [name] creation errors do not reflect on  the standard of medical care.

## 2024-09-23 NOTE — BH Specialist Note (Signed)
 Integrated Behavioral Health via Telemedicine Visit  09/23/2024 Rachel [name] [id number]  Number of Integrated Behavioral Health Clinician visits: Additional Visit  Session Start time: 1046   Session End time: 1215  Total time in minutes: 89  Referring Provider: Jerilynn Buddle, MD Patient/Family location: Home [facility] Provider location: [facility]  All persons participating in visit: [name] and [facility] Delainey Winstanley   Types of Service: Individual psychotherapy and Telephone visit  I connected with Rachel [name] [name]'s n/a via  Telephone or Video Enabled Telemedicine Application  (Video is Caregility application) and verified that I am speaking with the correct person using two identifiers. Discussed confidentiality: Yes   I discussed the limitations of telemedicine and the availability of in person appointments.  Discussed there is a possibility of technology failure and discussed alternative modes of communication if that failure occurs.  I discussed that engaging in this telemedicine visit, they consent to the provision of behavioral healthcare and the services will be billed under their insurance.  Patient and/or legal guardian expressed understanding and consented to Telemedicine visit: Yes   Presenting Concerns: Patient and/or family reports the following symptoms/concerns: Increasing anxiety and panic attacks, after receiving a letter saying her Medicaid would be terminated on 10/12/24 (letter received on 09/22/24), due to income, although she has no income.  Duration of problem: Yesterday received letter; Severity of problem: moderately severe  Patient and/or Family's Strengths/Protective Factors: Social connections, Social and Emotional competence, and Sense of purpose  Goals Addressed: Patient will:  Reduce symptoms of: anxiety, depression, and  stress   Increase knowledge and/or ability of: stress reduction   Demonstrate ability to: Increase healthy adjustment to current life circumstances  Progress towards Goals: Ongoing  Interventions: Interventions utilized:  Solution-Focused Strategies and Supportive Reflection Standardized Assessments completed: Not Needed  Patient and/or Family Response: Patient agrees with treatment plan.  Clinical Assessment/Diagnosis  Acute stress reaction  Post traumatic stress disorder (PTSD)  Attention deficit hyperactivity disorder (ADHD), unspecified ADHD type  Psychosocial stressors   Patient may benefit from continued therapeutic intervention .  Plan: Follow up with behavioral health clinician on : Two weeks Behavioral recommendations:  -Continue taking Vyvanse  and hydroxyzine  as prescribed -Continue daily self-coping strategies as needed (time with supportive people in life; healthy self-care; healthy boundaries; self-advocacy for improved healthcare; ongoing creative projects) -Go to PCP office tomorrow, 09/24/24 to pick up orange card paperwork; make an appointment to see financial counselor -Continue plan to attend upcoming medical specialist appointments through 10/12/24 -Continue plan to appeal Medicaid decision Referral(s): Integrated [employer] (In Clinic)  I discussed the assessment and treatment plan with the patient and/or parent/guardian. They were provided an opportunity to ask questions and all were answered. They agreed with the plan and demonstrated an understanding of the instructions.   They were advised to call back or seek an in-person evaluation if the symptoms worsen or if the condition fails to improve as anticipated.  [name], LCSW     07/28/2024    9:13 AM 01/23/2024    9:08 AM 04/23/2023    8:50 AM 11/02/2022    9:23 AM 08/02/2022    2:20 PM  Depression screen PHQ 2/9  Decreased Interest 1 2 0 0 0  Down, Depressed, Hopeless 0 2  1 1  0  PHQ - 2 Score 1 4 1 1  0  Altered sleeping 2 3 2 1 2   Tired, decreased energy  2 3 2 1 2   Change in appetite 1 3 2 1 1   Feeling bad or failure about yourself  0 0 0 0 0  Trouble concentrating 2 2 1  0 1  Moving slowly or fidgety/restless 0 0 0 0 0  Suicidal thoughts 0 0 0 0 0  PHQ-9 Score 8  15  8  4  6       Data saved with a previous flowsheet row definition      01/23/2024    9:10 AM 04/23/2023    8:51 AM 11/02/2022    9:24 AM 08/02/2022    2:20 PM  GAD 7 : Generalized Anxiety Score  Nervous, Anxious, on Edge 2 2 1  0  Control/stop worrying 2 1 0 0  Worry too much - different things 2 0 0 0  Trouble relaxing 0 0 1 1  Restless 0 0 0 0  Easily annoyed or irritable 0 0 0 0  Afraid - awful might happen 3 0 0 0  Total GAD 7 Score 9 3 2  1

## 2024-09-24 ENCOUNTER — Encounter (HOSPITAL_BASED_OUTPATIENT_CLINIC_OR_DEPARTMENT_OTHER): Payer: Self-pay | Admitting: Physical Therapy

## 2024-09-24 ENCOUNTER — Ambulatory Visit (HOSPITAL_BASED_OUTPATIENT_CLINIC_OR_DEPARTMENT_OTHER): Admitting: Physical Therapy

## 2024-09-24 DIAGNOSIS — D508 Other iron deficiency anemias: Secondary | ICD-10-CM | POA: Diagnosis not present

## 2024-09-24 DIAGNOSIS — M791 Myalgia, unspecified site: Secondary | ICD-10-CM | POA: Diagnosis not present

## 2024-09-24 DIAGNOSIS — B999 Unspecified infectious disease: Secondary | ICD-10-CM | POA: Diagnosis not present

## 2024-09-24 DIAGNOSIS — M5459 Other low back pain: Secondary | ICD-10-CM | POA: Diagnosis not present

## 2024-09-24 DIAGNOSIS — E782 Mixed hyperlipidemia: Secondary | ICD-10-CM | POA: Diagnosis not present

## 2024-09-24 DIAGNOSIS — R2689 Other abnormalities of gait and mobility: Secondary | ICD-10-CM | POA: Diagnosis not present

## 2024-09-24 DIAGNOSIS — J4520 Mild intermittent asthma, uncomplicated: Secondary | ICD-10-CM | POA: Diagnosis not present

## 2024-09-24 DIAGNOSIS — M2550 Pain in unspecified joint: Secondary | ICD-10-CM | POA: Diagnosis not present

## 2024-09-24 DIAGNOSIS — F902 Attention-deficit hyperactivity disorder, combined type: Secondary | ICD-10-CM | POA: Diagnosis not present

## 2024-09-24 DIAGNOSIS — Z889 Allergy status to unspecified drugs, medicaments and biological substances status: Secondary | ICD-10-CM | POA: Diagnosis not present

## 2024-09-24 DIAGNOSIS — M6281 Muscle weakness (generalized): Secondary | ICD-10-CM | POA: Diagnosis not present

## 2024-09-24 DIAGNOSIS — K30 Functional dyspepsia: Secondary | ICD-10-CM | POA: Diagnosis not present

## 2024-09-24 DIAGNOSIS — R109 Unspecified abdominal pain: Secondary | ICD-10-CM | POA: Diagnosis not present

## 2024-09-24 DIAGNOSIS — G8929 Other chronic pain: Secondary | ICD-10-CM | POA: Diagnosis not present

## 2024-09-24 DIAGNOSIS — J3089 Other allergic rhinitis: Secondary | ICD-10-CM | POA: Diagnosis not present

## 2024-09-24 DIAGNOSIS — R208 Other disturbances of skin sensation: Secondary | ICD-10-CM | POA: Diagnosis not present

## 2024-09-24 DIAGNOSIS — K449 Diaphragmatic hernia without obstruction or gangrene: Secondary | ICD-10-CM | POA: Diagnosis not present

## 2024-09-24 DIAGNOSIS — R21 Rash and other nonspecific skin eruption: Secondary | ICD-10-CM | POA: Diagnosis not present

## 2024-09-24 DIAGNOSIS — F319 Bipolar disorder, unspecified: Secondary | ICD-10-CM | POA: Diagnosis not present

## 2024-09-24 DIAGNOSIS — L2089 Other atopic dermatitis: Secondary | ICD-10-CM | POA: Diagnosis not present

## 2024-09-24 DIAGNOSIS — R7689 Other specified abnormal immunological findings in serum: Secondary | ICD-10-CM | POA: Diagnosis not present

## 2024-09-24 DIAGNOSIS — G43709 Chronic migraine without aura, not intractable, without status migrainosus: Secondary | ICD-10-CM | POA: Diagnosis not present

## 2024-09-24 DIAGNOSIS — K582 Mixed irritable bowel syndrome: Secondary | ICD-10-CM | POA: Diagnosis not present

## 2024-09-24 DIAGNOSIS — K219 Gastro-esophageal reflux disease without esophagitis: Secondary | ICD-10-CM | POA: Diagnosis not present

## 2024-09-24 NOTE — Telephone Encounter (Addendum)
 Patient came in stating her insurance with Medicaid could be terminated by the end of the month, patient is requesting if PCP could advocate for her and type a letter stating her medical conditions and disability and that she's been unable to work for the past 8 yrs because of her disability. I advised patient as well that she could go to the 4th Floor [address] and speak with Medicaid and also Eric if she had any questions regarding [employer] and the [employer]. Patient states if possible to have the letter by 11/19 to make an appeal. Papers scanned into Media

## 2024-09-24 NOTE — Telephone Encounter (Signed)
 Routing to PCP for review.

## 2024-09-24 NOTE — Telephone Encounter (Signed)
 Called and spoke with patient, she has been out of work for 8 years. She has been on Medicaid since just prior to the Pandemic, after getting divorced. She notes that she cannot stand for longer than 20 min without POTS sx exacerbation. She has chronic pain from EDS. She spend most days going to doctors appointments or in bed. She is currently seeing a mental health provider who discussed the [facility] Card with her. States that she received a letter for DSS stating that she no longer qualifies for Medicaid because she makes too much money, but again, hasn't worked in 8 year.   She had lost her disability temporarily when she got married but later divorces in 08/2018.   I requested that pt bring a copy of the letter by the office so that we can review and try to assist with getting her Medicaid reinstated. She is also working on trying to get disability benefits.

## 2024-09-24 NOTE — Therapy (Signed)
 OUTPATIENT PHYSICAL THERAPY THORACOLUMBAR TREATMENT   Patient Name: Rachel [name] MRN: [id number] DOB:1986/01/02, 38 y.o., female Today's Date: 09/24/2024  END OF SESSION:  PT End of Session - 09/24/24 1116     Visit Number 5    Date for Recertification  10/10/24    Authorization Type [facility]    Authorization - Number of Visits 7    PT Start Time 1103    PT Stop Time 1141    PT Time Calculation (min) 38 min    Activity Tolerance Patient tolerated treatment well    Behavior During Therapy WFL for tasks assessed/performed          Past Medical History:  Diagnosis Date   ADHD (attention deficit hyperactivity disorder)    Anemia    Anxiety    Asthma    Bipolar disorder (HCC)    Complication of anesthesia    slow to wake up    Delayed gastric emptying 02/23/2020   Depression    DUB (dysfunctional uterine bleeding) 02/23/2020   Eczema    Ehlers-Danlos syndrome    Endometriosis    Gastroparesis    GERD (gastroesophageal reflux disease)    Heart murmur    Hiatal hernia 04/06/2020   Myalgia 04/06/2020   Nausea & vomiting 09/22/2022   Parasitic infection 02/23/2020   Pneumonia    Poor appetite 02/23/2020   Recurrent upper respiratory infection (URI)    RUQ pain 02/23/2020   Vitamin D  deficiency    Weight loss 02/23/2020   Past Surgical History:  Procedure Laterality Date   barium study     COLONOSCOPY     CYSTOSCOPY N/A 07/12/2021   Procedure: CYSTOSCOPY;  Surgeon: Zina Jerilynn LABOR, MD;  Location: [facility] OR;  Service: Gynecology;  Laterality: N/A;   ESOPHAGOGASTRODUODENOSCOPY  2011   and a ph study as well.    LAPAROSCOPIC GASTRIC RESECTION N/A 01/22/2023   Procedure: LAPAROSCOPIC GASTRIC PEXY;  Surgeon: Lyndel Deward PARAS, MD;  Location: [facility] ORS;  Service: General;  Laterality: N/A;   LAPAROSCOPY N/A 01/26/2021   Procedure: LAPAROSCOPY DIAGNOSTIC;  Surgeon: Zina Jerilynn LABOR, MD;  Location: [facility];  Service: Gynecology;   Laterality: N/A;   NASAL SEPTUM SURGERY     SINOSCOPY     TONGUE FLAP     TOTAL LAPAROSCOPIC HYSTERECTOMY WITH SALPINGECTOMY Bilateral 07/12/2021   Procedure: TOTAL LAPAROSCOPIC HYSTERECTOMY WITH SALPINGECTOMY, OOPHORECTOMY;  Surgeon: Zina Jerilynn LABOR, MD;  Location: [facility] OR;  Service: Gynecology;  Laterality: Bilateral;   UPPER GASTROINTESTINAL ENDOSCOPY     WISDOM TOOTH EXTRACTION     Patient Active Problem List   Diagnosis Date Noted   Ehlers-Danlos syndrome 08/05/2024   Acute stress reaction 06/14/2023   Chronic migraine w/o aura w/o status migrainosus, not intractable 02/23/2023   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   Memory loss 12/25/2022   Paresthesia 12/25/2022   Other allergic rhinitis 12/04/2022   Allergic conjunctivitis of both eyes 12/04/2022   Mild intermittent asthma without complication 12/04/2022   Other atopic dermatitis 12/04/2022   Allergic reaction 12/04/2022   Rash and other nonspecific skin eruption 12/04/2022   Multiple drug allergies 12/04/2022   Other adverse food reactions, not elsewhere classified, subsequent encounter 12/04/2022   Abnormal CT of the abdomen 09/23/2022   Abdominal pain 09/22/2022   Asthma, chronic 09/22/2022   HLD (hyperlipidemia) 09/22/2022   Nausea & vomiting 09/22/2022   Iron  deficiency anemia 01/20/2022   Acute hyponatremia 01/18/2022   Pyelonephritis 01/18/2022   Protein-calorie malnutrition, severe  01/18/2022   sepsis secondary to pyelonephritis of left kidney 01/17/2022   Sepsis (HCC) 01/17/2022   Chronic abdominal pain 01/17/2022   Dyspnea 01/17/2022   Underweight 01/17/2022   Irritable bowel syndrome (IBS) 01/17/2022   Hypokalemia 01/17/2022   Vasomotor symptoms due to menopause 08/17/2021   S/P laparoscopic hysterectomy 07/12/2021   Preoperative exam for gynecologic surgery 06/20/2021   Draining postoperative wound 02/28/2021   Encounter for postoperative care 02/16/2021   Endometriosis determined by laparoscopy    Pelvic  pain in female 10/11/2020   Presence of subdermal contraceptive implant 10/11/2020   Dysmenorrhea 10/11/2020   Dyspareunia in female 10/11/2020   NSVT (nonsustained ventricular tachycardia) (HCC) 09/03/2020   Gastroparesis 08/18/2020   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   Palpitations 07/26/2020   Family history of connective tissue disease 07/26/2020   Myalgia 04/06/2020   Hiatal hernia 04/06/2020   Recurrent infections 02/23/2020   Weight loss 02/23/2020   Poor appetite 02/23/2020   RUQ pain 02/23/2020   Delayed gastric emptying 02/23/2020   DUB (dysfunctional uterine bleeding) 02/23/2020   Cervical radiculopathy 05/31/2018   Hyperalgesia 05/31/2018   Muscle weakness 03/13/2018   GERD (gastroesophageal reflux disease) 12/26/2017   ADHD (attention deficit hyperactivity disorder) 05/07/2012   Bipolar 1 disorder (HCC) 05/07/2012    PCP: Corrina Sabin MD  REFERRING PROVIDER:   Dolphus Reiter, MD    REFERRING DIAG:  M79.10 (ICD-10-CM) - Myalgia  M25.50 (ICD-10-CM) - Hypermobility arthralgia    Rationale for Evaluation and Treatment: Rehabilitation  THERAPY DIAG:  Muscle weakness (generalized)  Other low back pain  Other abnormalities of gait and mobility  ONSET DATE: Chronic  SUBJECTIVE:                                                                                                                                                                                           SUBJECTIVE STATEMENT: Pt reports increased pain  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  Increased stress as pt received a notice from medicaid that her benefits may be cut  Initial subjective Last year has been an nightmare. Mds think I have a autoimmune disease. Official dx with EDS and POTS. Dr Joane wants me back in aquatics because land puts me into my bed.  I have bee getting dizzy (due to POTS).  I have been able to come to the gym at times at least 1 time a week (40 minutes treadmill, some machines).  I have  not been able to come to the pool at the times that it is available  PERTINENT HISTORY:  Dr Deveshwar:Water  therapy/PT for hypermobility and myofascial pain syndrome.   Dr Joane: Evaluate and  Treat for hypermobility and polyarthralgia 1-2 times per week for 4-6 weeks.   Decrease pain, increase strength, flexibility, function, and range of motion.  Modalities may include, traction, ionto, phono, stim, and dry needling prn.  Gastroparesis, ADHD; bilpolar; hypermobility; Myofacial pain syndrome; EDS; dysautonomia   PAIN:  Are you having pain? Yes: NPRS scale: current 7/10; worst 9/10; least 6.5 Pain location: generally cervical spine neck shoulders and spine Pain description: Variable:achy, stiff painful sometimes radiating Aggravating factors: movement, upright sitting, standing Relieving factors: lying down; reclines, heating pad  PRECAUTIONS: None  RED FLAGS: None   WEIGHT BEARING RESTRICTIONS: No  FALLS:  Has patient fallen in last 6 months? Unknown  LIVING ENVIRONMENT:  Steps inside and outside   OCCUPATION: disabled  PLOF: Needs assistance with ADLs Variable  PATIENT GOALS: decrease pain, become more active  NEXT MD VISIT: as needed  OBJECTIVE:  Note: Objective measures were completed at Evaluation unless otherwise noted.   PATIENT SURVEYS:  ODI: 38/50= 76%  COGNITION: Overall cognitive status: Within functional limits for tasks assessed     SENSATION: generalized hyperalgesia   POSTURE: forward head  PALPATION: TTP throughout   LUMBAR ROM:   Hypermobile  LOWER EXTREMITY ROM:     Hypermobile  LOWER EXTREMITY MMT:    MMT Right eval Left eval  Hip flexion 4 4  Hip extension    Hip abduction    Hip adduction    Hip internal rotation    Hip external rotation    Knee flexion 4 4  Knee extension 4 4  Ankle dorsiflexion    Ankle plantarflexion    Ankle inversion    Ankle eversion     (Blank rows = not tested)   FUNCTIONAL TESTS:   Timed up and go (TUG): 18.86   4 stage balance:FT x 20s; Unable to complete semi-tandem; tandem; SLS GAIT: Distance walked: 400 ft Assistive device utilized: None Level of assistance: Complete Independence Comments: Gait slowed. Pause upon standing to ensure steadiness  TREATMENT  [facility] Adult PT Treatment:                                                DATE: 09/24/24 Pt seen for aquatic therapy today.  Treatment took place in water  3.5-4.75 ft in depth at the [facility] pool. Temp of water  was 91.  Pt entered/exited the pool via stairs using step through pattern with hand rail.   *walking forward, back and side stepping in 3.6 ft with ue support of solid noodle  - seated rest periods *PPT and hip hiking seated swing like on solid noodle  - squatted rest period *seated balance on solid noodle->core engagement pushing down with suspension above 15 s hold x 3 *TrA set using 1/2 noodle wide stance x 8; staggered stances x 5   - squatted rest period  Pt requires the buoyancy and hydrostatic pressure of water  for support, and to offload joints by unweighting joint load by at least 50 % in navel deep water  and by at least 75-80% in chest to neck deep water .  Viscosity of the water  is needed for resistance of strengthening. Water  current perturbations provides challenge to standing balance requiring increased core activation.  PATIENT EDUCATION:  Education details: Discussed eval findings, rehab rationale, aquatic program progression/POC and pools in area. Patient is in agreement  Person educated: Patient Education method: Explanation Education comprehension: verbalized understanding  HOME EXERCISE PROGRAM: From previous episode x 1 yr ago: Access Code: [email or web address]   ASSESSMENT:  CLINICAL IMPRESSION: Pt has poor toleration to session due to high anxiety with residual high pain. Cuing provided for reduction in pain. Sitting balance  on noodle engaging core settles discomfort. Goals ongoing      Initial Impression Patient is a 38 y.o. f who was seen today for physical therapy evaluation and treatment for hypomobility and myofacial pain syndrome. She is well known to this clinic having been seen multiple times in aquatics with similar dx.  She returns today with added dx of dysautonomia, POTS and possible autoimmune disorder. Patient presents with general pain limited deficits throughout shoulders and entire spine effecting endurance, activity tolerance, gait, balance, and functional mobility with ADL's. As indicated by her outcomes measure score, subjective information and objective testing she is having to modify all activities for safety.  She will benefit from aquatic physical therapy intervention to improve all areas of deficits and instruct on aquatic HEP encouraging a more frequent exercise regimen.   OBJECTIVE IMPAIRMENTS: decreased activity tolerance, decreased balance, decreased endurance, decreased mobility, decreased strength, dizziness, pain, and hypermobility.   ACTIVITY LIMITATIONS: carrying, lifting, bending, sitting, standing, squatting, stairs, and locomotion level  PARTICIPATION LIMITATIONS: meal prep, cleaning, laundry, driving, shopping, community activity, occupation, and yard work  PERSONAL FACTORS: Behavior pattern, Past/current experiences, Time since onset of injury/illness/exacerbation, Transportation, and 1-2 comorbidities: See PmHx are also affecting patient's functional outcome.   REHAB POTENTIAL: Fair chronicity of condition  CLINICAL DECISION MAKING: Stable/uncomplicated  EVALUATION COMPLEXITY: Low   GOALS: Goals reviewed with patient? Yes  SHORT TERM GOALS: Target date: 10/25  Pt will tolerate full aquatic sessions consistently without increase in pain and with improving function to demonstrate good toleration and effectiveness of intervention.  Baseline: Goal status:Met  09/17/24   2.  Pt will tolerate amb 5 consecutive minutes submerged to demonstrates progression of toleration to activity Baseline:  Goal status: in progress 09/17/24   LONG TERM GOALS: Target date: 10/10/24  Pt to improve on ODI by 13% to demonstrate statistically significant Improvement in function. (MCID 13-15%) Baseline:38/50= 76% Goal status: INITIAL  2.  Pt will report return to routine exercise regimen Baseline:  Goal status: INITIAL  3.  Pt will report decrease in pain by at least 50% for improved toleration to activity/quality of life and to demonstrate improved management of pain. Baseline:  Goal status: INITIAL  4.  Pt will be indep with final HEP's (land and aquatic as appropriate) for continued management of condition Baseline:  Goal status: INITIAL  5.  Pt will tolerate full 30 minutes of aquatic exercise to demonstrate an improvement in activity toleration Baseline:  Goal status: INITIAL   PLAN:  PT FREQUENCY: 1-2x/week  PT DURATION: 6 weeks   PLANNED INTERVENTIONS:  97750- Physical Performance Testing, 97110-Therapeutic exercises, 97530- Therapeutic activity, 97112- Neuromuscular re-education, 97535- Self Care, [id number]- Manual therapy, [id number]- Gait training, [phone]- Aquatic Therapy, [phone] (1-2 muscles), [phone] (3+ muscles)- Dry Needling, Patient/Family education, Balance training, Stair training, Taping, Joint mobilization, DME instructions, Cryotherapy, and Moist heat.  PLAN FOR NEXT SESSION: aquatics only: general strengthening; endurance training; pain management   Ronal Foots) Cortney Beissel MPT 09/24/24 11:25 AM [facility] [address] [place], [place], [address] Phone:  [phone]   Fax:  [phone]  For all possible CPT codes, reference the Planned Interventions line above.     Check all conditions that are expected to impact treatment: {Conditions expected to impact treatment:Musculoskeletal  disorders   If treatment provided at initial evaluation, no treatment charged due to lack of authorization.

## 2024-09-24 NOTE — Telephone Encounter (Signed)
 Form has been dropped off, will review.

## 2024-09-25 NOTE — Telephone Encounter (Signed)
 Letter has been written.

## 2024-09-25 NOTE — Telephone Encounter (Signed)
 Patient was called and informed that letter is ready for pick up.   She is requesting a letter for Disability stating her chronic health conditions.

## 2024-09-26 ENCOUNTER — Other Ambulatory Visit: Payer: Self-pay | Admitting: Allergy

## 2024-09-29 ENCOUNTER — Encounter (HOSPITAL_BASED_OUTPATIENT_CLINIC_OR_DEPARTMENT_OTHER): Payer: Self-pay | Admitting: Physical Therapy

## 2024-09-29 ENCOUNTER — Ambulatory Visit (HOSPITAL_BASED_OUTPATIENT_CLINIC_OR_DEPARTMENT_OTHER): Admitting: Physical Therapy

## 2024-09-29 ENCOUNTER — Telehealth: Payer: Self-pay | Admitting: Family Medicine

## 2024-09-29 DIAGNOSIS — M6281 Muscle weakness (generalized): Secondary | ICD-10-CM | POA: Diagnosis not present

## 2024-09-29 DIAGNOSIS — M791 Myalgia, unspecified site: Secondary | ICD-10-CM | POA: Diagnosis not present

## 2024-09-29 DIAGNOSIS — K449 Diaphragmatic hernia without obstruction or gangrene: Secondary | ICD-10-CM | POA: Diagnosis not present

## 2024-09-29 DIAGNOSIS — J4520 Mild intermittent asthma, uncomplicated: Secondary | ICD-10-CM | POA: Diagnosis not present

## 2024-09-29 DIAGNOSIS — F319 Bipolar disorder, unspecified: Secondary | ICD-10-CM | POA: Diagnosis not present

## 2024-09-29 DIAGNOSIS — L2089 Other atopic dermatitis: Secondary | ICD-10-CM | POA: Diagnosis not present

## 2024-09-29 DIAGNOSIS — D508 Other iron deficiency anemias: Secondary | ICD-10-CM | POA: Diagnosis not present

## 2024-09-29 DIAGNOSIS — B999 Unspecified infectious disease: Secondary | ICD-10-CM | POA: Diagnosis not present

## 2024-09-29 DIAGNOSIS — E782 Mixed hyperlipidemia: Secondary | ICD-10-CM | POA: Diagnosis not present

## 2024-09-29 DIAGNOSIS — R2689 Other abnormalities of gait and mobility: Secondary | ICD-10-CM | POA: Diagnosis not present

## 2024-09-29 DIAGNOSIS — R7689 Other specified abnormal immunological findings in serum: Secondary | ICD-10-CM | POA: Diagnosis not present

## 2024-09-29 DIAGNOSIS — K30 Functional dyspepsia: Secondary | ICD-10-CM | POA: Diagnosis not present

## 2024-09-29 DIAGNOSIS — F902 Attention-deficit hyperactivity disorder, combined type: Secondary | ICD-10-CM | POA: Diagnosis not present

## 2024-09-29 DIAGNOSIS — R21 Rash and other nonspecific skin eruption: Secondary | ICD-10-CM | POA: Diagnosis not present

## 2024-09-29 DIAGNOSIS — M5459 Other low back pain: Secondary | ICD-10-CM

## 2024-09-29 DIAGNOSIS — K582 Mixed irritable bowel syndrome: Secondary | ICD-10-CM | POA: Diagnosis not present

## 2024-09-29 DIAGNOSIS — Z889 Allergy status to unspecified drugs, medicaments and biological substances status: Secondary | ICD-10-CM | POA: Diagnosis not present

## 2024-09-29 DIAGNOSIS — G8929 Other chronic pain: Secondary | ICD-10-CM | POA: Diagnosis not present

## 2024-09-29 DIAGNOSIS — K219 Gastro-esophageal reflux disease without esophagitis: Secondary | ICD-10-CM | POA: Diagnosis not present

## 2024-09-29 DIAGNOSIS — R109 Unspecified abdominal pain: Secondary | ICD-10-CM | POA: Diagnosis not present

## 2024-09-29 DIAGNOSIS — J3089 Other allergic rhinitis: Secondary | ICD-10-CM | POA: Diagnosis not present

## 2024-09-29 DIAGNOSIS — R208 Other disturbances of skin sensation: Secondary | ICD-10-CM | POA: Diagnosis not present

## 2024-09-29 DIAGNOSIS — G43709 Chronic migraine without aura, not intractable, without status migrainosus: Secondary | ICD-10-CM | POA: Diagnosis not present

## 2024-09-29 DIAGNOSIS — M2550 Pain in unspecified joint: Secondary | ICD-10-CM | POA: Diagnosis not present

## 2024-09-29 NOTE — Telephone Encounter (Signed)
 Does patient need to come in office, or can we do a video visit.

## 2024-09-29 NOTE — Telephone Encounter (Signed)
Called pt, left VM to call the office.  

## 2024-09-29 NOTE — Telephone Encounter (Signed)
We can do a video visit. 

## 2024-09-29 NOTE — Telephone Encounter (Signed)
 Copied from CRM [id number]. Topic: General - Other >> Sep 29, 2024  7:32 AM Rachel [name] wrote:  Reason for CRM: Says she has possible sty.. Wants to know if she needs to be seen?  She is unable to come in today - but she is asking if she can get eye drops?

## 2024-09-29 NOTE — Telephone Encounter (Signed)
 Pt returned call and advised that Medicaid had used old information when processing her benefits. DSS plans on re-instating her Medicaid and pt will be applying for disability benefits.   She will let us  know when the process for disability benefits starts so that we can keep an eye out for the form.

## 2024-09-29 NOTE — Telephone Encounter (Signed)
 Contacted patient, advised patient of the mobile clinic to be seen sooner but patient declined and requested for eye drops or medication to treat stye. Please advise.

## 2024-09-29 NOTE — Therapy (Signed)
 OUTPATIENT PHYSICAL THERAPY THORACOLUMBAR TREATMENT   Patient Name: Rachel [name] MRN: [id number] DOB:[birth year], 38 y.o., female Today's Date: 09/29/2024  END OF SESSION:  PT End of Session - 09/29/24 0950     Visit Number 6    Date for Recertification  10/10/24    Authorization Type [facility]    Authorization - Number of Visits 7    PT Start Time [phone]    PT Stop Time 0925    PT Time Calculation (min) 39 min    Activity Tolerance Patient tolerated treatment well    Behavior During Therapy [facility] for tasks assessed/performed           Past Medical History:  Diagnosis Date   ADHD (attention deficit hyperactivity disorder)    Anemia    Anxiety    Asthma    Bipolar disorder (HCC)    Complication of anesthesia    slow to wake up    Delayed gastric emptying 02/23/2020   Depression    DUB (dysfunctional uterine bleeding) 02/23/2020   Eczema    Ehlers-Danlos syndrome    Endometriosis    Gastroparesis    GERD (gastroesophageal reflux disease)    Heart murmur    Hiatal hernia 04/06/2020   Myalgia 04/06/2020   Nausea & vomiting 09/22/2022   Parasitic infection 02/23/2020   Pneumonia    Poor appetite 02/23/2020   Recurrent upper respiratory infection (URI)    RUQ pain 02/23/2020   Vitamin D  deficiency    Weight loss 02/23/2020   Past Surgical History:  Procedure Laterality Date   barium study     COLONOSCOPY     CYSTOSCOPY N/A 07/12/2021   Procedure: CYSTOSCOPY;  Surgeon: Zina Jerilynn LABOR, MD;  Location: [facility] OR;  Service: Gynecology;  Laterality: N/A;   ESOPHAGOGASTRODUODENOSCOPY  2011   and a ph study as well.    LAPAROSCOPIC GASTRIC RESECTION N/A 01/22/2023   Procedure: LAPAROSCOPIC GASTRIC PEXY;  Surgeon: Lyndel Deward PARAS, MD;  Location: [facility];  Service: General;  Laterality: N/A;   LAPAROSCOPY N/A 01/26/2021   Procedure: LAPAROSCOPY DIAGNOSTIC;  Surgeon: Zina Jerilynn LABOR, MD;  Location: [facility];  Service: Gynecology;   Laterality: N/A;   NASAL SEPTUM SURGERY     SINOSCOPY     TONGUE FLAP     TOTAL LAPAROSCOPIC HYSTERECTOMY WITH SALPINGECTOMY Bilateral 07/12/2021   Procedure: TOTAL LAPAROSCOPIC HYSTERECTOMY WITH SALPINGECTOMY, OOPHORECTOMY;  Surgeon: Zina Jerilynn LABOR, MD;  Location: [facility] OR;  Service: Gynecology;  Laterality: Bilateral;   UPPER GASTROINTESTINAL ENDOSCOPY     WISDOM TOOTH EXTRACTION     Patient Active Problem List   Diagnosis Date Noted   Ehlers-Danlos syndrome 08/05/2024   Acute stress reaction 06/14/2023   Chronic migraine w/o aura w/o status migrainosus, not intractable 02/23/2023   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   Memory loss 12/25/2022   Paresthesia 12/25/2022   Other allergic rhinitis 12/04/2022   Allergic conjunctivitis of both eyes 12/04/2022   Mild intermittent asthma without complication 12/04/2022   Other atopic dermatitis 12/04/2022   Allergic reaction 12/04/2022   Rash and other nonspecific skin eruption 12/04/2022   Multiple drug allergies 12/04/2022   Other adverse food reactions, not elsewhere classified, subsequent encounter 12/04/2022   Abnormal CT of the abdomen 09/23/2022   Abdominal pain 09/22/2022   Asthma, chronic 09/22/2022   HLD (hyperlipidemia) 09/22/2022   Nausea & vomiting 09/22/2022   Iron  deficiency anemia 01/20/2022   Acute hyponatremia 01/18/2022   Pyelonephritis 01/18/2022   Protein-calorie malnutrition,  severe 01/18/2022   sepsis secondary to pyelonephritis of left kidney 01/17/2022   Sepsis (HCC) 01/17/2022   Chronic abdominal pain 01/17/2022   Dyspnea 01/17/2022   Underweight 01/17/2022   Irritable bowel syndrome (IBS) 01/17/2022   Hypokalemia 01/17/2022   Vasomotor symptoms due to menopause 08/17/2021   S/P laparoscopic hysterectomy 07/12/2021   Preoperative exam for gynecologic surgery 06/20/2021   Draining postoperative wound 02/28/2021   Encounter for postoperative care 02/16/2021   Endometriosis determined by laparoscopy    Pelvic  pain in female 10/11/2020   Presence of subdermal contraceptive implant 10/11/2020   Dysmenorrhea 10/11/2020   Dyspareunia in female 10/11/2020   NSVT (nonsustained ventricular tachycardia) (HCC) 09/03/2020   Gastroparesis 08/18/2020   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   Palpitations 07/26/2020   Family history of connective tissue disease 07/26/2020   Myalgia 04/06/2020   Hiatal hernia 04/06/2020   Recurrent infections 02/23/2020   Weight loss 02/23/2020   Poor appetite 02/23/2020   RUQ pain 02/23/2020   Delayed gastric emptying 02/23/2020   DUB (dysfunctional uterine bleeding) 02/23/2020   Cervical radiculopathy 05/31/2018   Hyperalgesia 05/31/2018   Muscle weakness 03/13/2018   GERD (gastroesophageal reflux disease) 12/26/2017   ADHD (attention deficit hyperactivity disorder) 05/07/2012   Bipolar 1 disorder (HCC) 05/07/2012    PCP: Corrina Sabin MD  REFERRING PROVIDER:   Dolphus Reiter, MD    REFERRING DIAG:  M79.10 (ICD-10-CM) - Myalgia  M25.50 (ICD-10-CM) - Hypermobility arthralgia    Rationale for Evaluation and Treatment: Rehabilitation  THERAPY DIAG:  Muscle weakness (generalized)  Other low back pain  Other abnormalities of gait and mobility  ONSET DATE: Chronic  SUBJECTIVE:                                                                                                                                                                                           SUBJECTIVE STATEMENT: Pt reports pain 7/10 today  Initial subjective Last year has been an nightmare. Mds think I have a autoimmune disease. Official dx with EDS and POTS. Dr Joane wants me back in aquatics because land puts me into my bed.  I have bee getting dizzy (due to POTS).  I have been able to come to the gym at times at least 1 time a week (40 minutes treadmill, some machines).  I have not been able to come to the pool at the times that it is available  PERTINENT HISTORY:  Dr  Deveshwar:Water  therapy/PT for hypermobility and myofascial pain syndrome.   Dr Joane: Evaluate and Treat for hypermobility and polyarthralgia 1-2 times per week for 4-6 weeks.   Decrease pain, increase strength,  flexibility, function, and range of motion.  Modalities may include, traction, ionto, phono, stim, and dry needling prn.  Gastroparesis, ADHD; bilpolar; hypermobility; Myofacial pain syndrome; EDS; dysautonomia   PAIN:  Are you having pain? Yes: NPRS scale: current 7/10; worst 9/10; least 6.5 Pain location: generally cervical spine neck shoulders and spine Pain description: Variable:achy, stiff painful sometimes radiating Aggravating factors: movement, upright sitting, standing Relieving factors: lying down; reclines, heating pad  PRECAUTIONS: None  RED FLAGS: None   WEIGHT BEARING RESTRICTIONS: No  FALLS:  Has patient fallen in last 6 months? Unknown  LIVING ENVIRONMENT:  Steps inside and outside   OCCUPATION: disabled  PLOF: Needs assistance with ADLs Variable  PATIENT GOALS: decrease pain, become more active  NEXT MD VISIT: as needed  OBJECTIVE:  Note: Objective measures were completed at Evaluation unless otherwise noted.   PATIENT SURVEYS:  ODI: 38/50= 76%  COGNITION: Overall cognitive status: Within functional limits for tasks assessed     SENSATION: generalized hyperalgesia   POSTURE: forward head  PALPATION: TTP throughout   LUMBAR ROM:   Hypermobile  LOWER EXTREMITY ROM:     Hypermobile  LOWER EXTREMITY MMT:    MMT Right eval Left eval  Hip flexion 4 4  Hip extension    Hip abduction    Hip adduction    Hip internal rotation    Hip external rotation    Knee flexion 4 4  Knee extension 4 4  Ankle dorsiflexion    Ankle plantarflexion    Ankle inversion    Ankle eversion     (Blank rows = not tested)   FUNCTIONAL TESTS:  Timed up and go (TUG): 18.86   4 stage balance:FT x 20s; Unable to complete semi-tandem; tandem;  SLS GAIT: Distance walked: 400 ft Assistive device utilized: None Level of assistance: Complete Independence Comments: Gait slowed. Pause upon standing to ensure steadiness  TREATMENT  [facility] Adult PT Treatment:                                                DATE: 09/29/24 Pt seen for aquatic therapy today.  Treatment took place in water  3.5-4.75 ft in depth at the [facility] pool. Temp of water  was 91.  Pt entered/exited the pool via stairs using step through pattern with hand rail.   *walking forward, back and side stepping in 3.6 ft with ue support of solid noodle  - seated rest periods *PPT and hip hiking seated swing like on solid noodle  - squatted rest period *TrA set using 1/2 noodle wide stance x 8; staggered stances x 5   - squatted rest period *decompression noodle wrapped anteriorly ue support corner wall: hip add/abd; flex/ex *seated balance on yellow noodle: unilateral then bilat ue elevation (good challenge); core engagement pushing down with suspension above 15 s hold x 3   Pt requires the buoyancy and hydrostatic pressure of water  for support, and to offload joints by unweighting joint load by at least 50 % in navel deep water  and by at least 75-80% in chest to neck deep water .  Viscosity of the water  is needed for resistance of strengthening. Water  current perturbations provides challenge to standing balance requiring increased core activation.  PATIENT EDUCATION:  Education details: Discussed eval findings, rehab rationale, aquatic program progression/POC and pools in area. Patient is in agreement  Person educated: Patient Education method: Explanation Education comprehension: verbalized understanding  HOME EXERCISE PROGRAM: From previous episode x 1 yr ago: Access Code: [email or web address]   ASSESSMENT:  CLINICAL IMPRESSION: Pt demonstrates improved toleration to session engaging, with rest periods, 38 minutes of  activity.  She amb 5 consecutive minutes submerged in 3.6 ft without requiring rest between meeting STG and progressing into LTG's. Her pain continues to be variable (as expected pe nature of dx). She will continue to benefit from skilled Aquatic PT for a few more sessions to further progress toward stated functional goals and establish (re) HEP.         Initial Impression Patient is a 38 y.o. f who was seen today for physical therapy evaluation and treatment for hypomobility and myofacial pain syndrome. She is well known to this clinic having been seen multiple times in aquatics with similar dx.  She returns today with added dx of dysautonomia, POTS and possible autoimmune disorder. Patient presents with general pain limited deficits throughout shoulders and entire spine effecting endurance, activity tolerance, gait, balance, and functional mobility with ADL's. As indicated by her outcomes measure score, subjective information and objective testing she is having to modify all activities for safety.  She will benefit from aquatic physical therapy intervention to improve all areas of deficits and instruct on aquatic HEP encouraging a more frequent exercise regimen.   OBJECTIVE IMPAIRMENTS: decreased activity tolerance, decreased balance, decreased endurance, decreased mobility, decreased strength, dizziness, pain, and hypermobility.   ACTIVITY LIMITATIONS: carrying, lifting, bending, sitting, standing, squatting, stairs, and locomotion level  PARTICIPATION LIMITATIONS: meal prep, cleaning, laundry, driving, shopping, community activity, occupation, and yard work  PERSONAL FACTORS: Behavior pattern, Past/current experiences, Time since onset of injury/illness/exacerbation, Transportation, and 1-2 comorbidities: See PmHx are also affecting patient's functional outcome.   REHAB POTENTIAL: Fair chronicity of condition  CLINICAL DECISION MAKING: Stable/uncomplicated  EVALUATION COMPLEXITY:  Low   GOALS: Goals reviewed with patient? Yes  SHORT TERM GOALS: Target date: 10/25  Pt will tolerate full aquatic sessions consistently without increase in pain and with improving function to demonstrate good toleration and effectiveness of intervention.  Baseline: Goal status:Met 09/17/24   2.  Pt will tolerate amb 5 consecutive minutes submerged to demonstrates progression of toleration to activity Baseline:  Goal status: in progress 09/17/24; Met 09/29/24   LONG TERM GOALS: Target date: 10/10/24  Pt to improve on ODI by 13% to demonstrate statistically significant Improvement in function. (MCID 13-15%) Baseline:38/50= 76% Goal status: INITIAL  2.  Pt will report return to routine exercise regimen Baseline:  Goal status: In progress 09/29/24  3.  Pt will report decrease in pain by at least 50% for improved toleration to activity/quality of life and to demonstrate improved management of pain. Baseline:  Goal status: In progress 11/17/25NITIAL  4.  Pt will be indep with final HEP's (land and aquatic as appropriate) for continued management of condition Baseline:  Goal status: INITIAL  5.  Pt will tolerate full 30 minutes of aquatic exercise to demonstrate an improvement in activity toleration Baseline:  Goal status: In progress 09/29/24   PLAN:  PT FREQUENCY: 1-2x/week  PT DURATION: 6 weeks   PLANNED INTERVENTIONS:  97750- Physical Performance Testing, 97110-Therapeutic exercises, 97530- Therapeutic activity, [id number]- Neuromuscular re-education, 97535- Self Care, 02859- Manual therapy, [id number]- Gait training, [phone]- Aquatic Therapy, [phone] (1-2 muscles), 20561 (3+ muscles)- Dry  Needling, Patient/Family education, Balance training, Stair training, Taping, Joint mobilization, DME instructions, Cryotherapy, and Moist heat.  PLAN FOR NEXT SESSION: aquatics only: general strengthening; endurance training; pain management   Ronal Foots) Caisley Baxendale MPT 09/29/24 10:09 AM [facility] [address] [place], [place], [address] Phone: [phone]   Fax:  [phone]  For all possible CPT codes, reference the Planned Interventions line above.     Check all conditions that are expected to impact treatment: {Conditions expected to impact treatment:Musculoskeletal disorders   If treatment provided at initial evaluation, no treatment charged due to lack of authorization.

## 2024-09-30 ENCOUNTER — Ambulatory Visit: Admitting: Dermatology

## 2024-09-30 ENCOUNTER — Encounter: Payer: Self-pay | Admitting: Dermatology

## 2024-09-30 VITALS — BP 138/81

## 2024-09-30 DIAGNOSIS — Z808 Family history of malignant neoplasm of other organs or systems: Secondary | ICD-10-CM | POA: Diagnosis not present

## 2024-09-30 DIAGNOSIS — R21 Rash and other nonspecific skin eruption: Secondary | ICD-10-CM

## 2024-09-30 NOTE — Patient Instructions (Addendum)
 VISIT SUMMARY:  Today, we discussed your recurrent rashes and family history of melanoma. We reviewed your symptoms, past treatments, and family history to develop a plan moving forward.  YOUR PLAN:  -RECURRENT UNDIAGNOSED SKIN RASH:  You have been experiencing recurrent rashes that vary in duration and appearance. These rashes are not related to allergies and have not responded consistently to over-the-counter treatments. Your ANA test was positive, which can sometimes indicate an autoimmune condition, but other tests were negative.   To get a definitive diagnosis, we need to perform a biopsy when the rash reappears. Please schedule an appointment within a week if the rash comes back and send a picture of the rash through MyChart.   -FAMILY HISTORY OF MELANOMA:  You have a significant family history of melanoma, which puts you at higher risk for skin cancer. You haven't had a skin check in over ten years, so we have scheduled a full body skin cancer screening. Moving forward, you should plan for annual skin checks to monitor for any suspicious changes.  INSTRUCTIONS:  If your rash reappears, please schedule an appointment within a week for a biopsy and send a picture of the rash through MyChart. Avoid using triamcinolone  if it makes the rash worse. Additionally, attend your scheduled full body skin cancer screening and plan for annual skin checks due to your high risk for melanoma.      Important Information  Due to recent changes in healthcare laws, you may see results of your pathology and/or laboratory studies on MyChart before the doctors have had a chance to review them. We understand that in some cases there may be results that are confusing or concerning to you. Please understand that not all results are received at the same time and often the doctors may need to interpret multiple results in order to provide you with the best plan of care or course of treatment. Therefore, we ask  that you please give us  2 business days to thoroughly review all your results before contacting the office for clarification. Should we see a critical lab result, you will be contacted sooner.   If You Need Anything After Your Visit  If you have any questions or concerns for your doctor, please call our main line at [phone] If no one answers, please leave a voicemail as directed and we will return your call as soon as possible. Messages left after 4 pm will be answered the following business day.   You may also send us  a message via MyChart. We typically respond to MyChart messages within 1-2 business days.  For prescription refills, please ask your pharmacy to contact our office. Our fax number is [phone].  If you have an urgent issue when the clinic is closed that cannot wait until the next business day, you can page your doctor at the number below.    Please note that while we do our best to be available for urgent issues outside of office hours, we are not available 24/7.   If you have an urgent issue and are unable to reach us , you may choose to seek medical care at your doctor's office, retail clinic, urgent care center, or emergency room.  If you have a medical emergency, please immediately call 911 or go to the emergency department. In the event of inclement weather, please call our main line at [phone] for an update on the status of any delays or closures.  Dermatology Medication Tips: Please keep the boxes that topical medications  come in in order to help keep track of the instructions about where and how to use these. Pharmacies typically print the medication instructions only on the boxes and not directly on the medication tubes.   If your medication is too expensive, please contact our office at [phone] or send us  a message through MyChart.   We are unable to tell what your co-pay for medications will be in advance as this is different depending on your  insurance coverage. However, we may be able to find a substitute medication at lower cost or fill out paperwork to get insurance to cover a needed medication.   If a prior authorization is required to get your medication covered by your insurance company, please allow us  1-2 business days to complete this process.  Drug prices often vary depending on where the prescription is filled and some pharmacies may offer cheaper prices.  The website www.goodrx.com contains coupons for medications through different pharmacies. The prices here do not account for what the cost may be with help from insurance (it may be cheaper with your insurance), but the website can give you the price if you did not use any insurance.  - You can print the associated coupon and take it with your prescription to the pharmacy.  - You may also stop by our office during regular business hours and pick up a GoodRx coupon card.  - If you need your prescription sent electronically to a different pharmacy, notify our office through [facility] or by phone at [phone]

## 2024-09-30 NOTE — Progress Notes (Signed)
   New Patient Visit   Subjective  Rachel [name] is a 38 y.o. female who presents for a NEW PATIENT appointment to be examined for the concerns as listed below.  Patient (and/or pt guardian) consented to the use of AI-assisted tools for note generation.  Rash: No rash present today. Her last flare was a month ago. Pt has had suspected lupus since with butterfly rash on face, she also has rashes that comes and goes at times leaving in days and other times leaving in months, she also has elevated labs consistent with autoimmune Dx. She is being managed by Dr. Dolphus, rheumatologist. She has seen an allergist who cleared her from rashes being allergy  related. She has tried Rx TMC, Benadryl  and used OTC hydrocortisone that she used for the rash but it did not help.    Family Hx of melanoma - mother & sister   Are you nursing, pregnant or trying to conceive? No   The following portions of the chart were reviewed this encounter and updated as appropriate: medications, allergies, medical history  Review of Systems:  No other skin or systemic complaints except as noted in HPI or Assessment and Plan.  Objective  Well appearing patient in no apparent distress; mood and affect are within normal limits.   A focused examination was performed of the following areas: n/a    Relevant exam findings are noted in the Assessment and Plan.    Assessment & Plan    Recurrent undiagnosed skin rash Intermittent rash with variable duration, sometimes lasting up to two months. Rash is not currently present. Previous treatments with hydrocortisone and triamcinolone  have had mixed results. No photosensitivity reported. ANA positive, but other autoimmune markers are negative. Differential includes autoimmune etiology vs eczema, but biopsy is needed for definitive diagnosis. Biopsies are not 100% diagnostic but are a piece of the puzzle used along with clinical context  - Schedule appointment  within a week if rash reappears for biopsy. - Use MyChart to send a message with a picture of the rash when it appears. - Avoid using triamcinolone  if it exacerbates the rash.  Family history of melanoma Family history of melanoma in mother and sister, with multiple recurrences in mother. No recent skin checks in the past ten years. Previous skin checks in mid-twenties with some suspicious moles biopsied. High risk for melanoma due to family history. - Scheduled full body skin cancer screening. - Plan for annual skin checks due to high risk.   Pre-visit planning reviewing the last office visit, labs, imaging and care everywhere when applicable was 15 minutes Intra-visit was 25 minutes and included updating the relevant history, performing a video or in-person physical exam as appropriate, creating a treatment plan and used shared decision making with the patient.  Post-visit was 10 minutes that encompassed note completion, placing of orders, updating patient instructions and coordination of care.    Return for TBSE.   Documentation: I have reviewed the above documentation for accuracy and completeness, and I agree with the above.  I, Shirron Maranda, CMA, am acting as scribe for [employer], DO.   Delon Lenis, DO

## 2024-09-30 NOTE — Telephone Encounter (Signed)
 Attempted to contact patient, LVM for return phone call.

## 2024-10-01 ENCOUNTER — Ambulatory Visit (HOSPITAL_BASED_OUTPATIENT_CLINIC_OR_DEPARTMENT_OTHER): Admitting: Physical Therapy

## 2024-10-01 ENCOUNTER — Encounter (HOSPITAL_BASED_OUTPATIENT_CLINIC_OR_DEPARTMENT_OTHER): Payer: Self-pay | Admitting: Physical Therapy

## 2024-10-01 DIAGNOSIS — R208 Other disturbances of skin sensation: Secondary | ICD-10-CM | POA: Diagnosis not present

## 2024-10-01 DIAGNOSIS — K582 Mixed irritable bowel syndrome: Secondary | ICD-10-CM | POA: Diagnosis not present

## 2024-10-01 DIAGNOSIS — M2550 Pain in unspecified joint: Secondary | ICD-10-CM | POA: Diagnosis not present

## 2024-10-01 DIAGNOSIS — R7689 Other specified abnormal immunological findings in serum: Secondary | ICD-10-CM | POA: Diagnosis not present

## 2024-10-01 DIAGNOSIS — F902 Attention-deficit hyperactivity disorder, combined type: Secondary | ICD-10-CM | POA: Diagnosis not present

## 2024-10-01 DIAGNOSIS — J4520 Mild intermittent asthma, uncomplicated: Secondary | ICD-10-CM | POA: Diagnosis not present

## 2024-10-01 DIAGNOSIS — G43709 Chronic migraine without aura, not intractable, without status migrainosus: Secondary | ICD-10-CM | POA: Diagnosis not present

## 2024-10-01 DIAGNOSIS — R2689 Other abnormalities of gait and mobility: Secondary | ICD-10-CM | POA: Diagnosis not present

## 2024-10-01 DIAGNOSIS — G8929 Other chronic pain: Secondary | ICD-10-CM | POA: Diagnosis not present

## 2024-10-01 DIAGNOSIS — M6281 Muscle weakness (generalized): Secondary | ICD-10-CM | POA: Diagnosis not present

## 2024-10-01 DIAGNOSIS — R109 Unspecified abdominal pain: Secondary | ICD-10-CM | POA: Diagnosis not present

## 2024-10-01 DIAGNOSIS — M5459 Other low back pain: Secondary | ICD-10-CM | POA: Diagnosis not present

## 2024-10-01 DIAGNOSIS — K219 Gastro-esophageal reflux disease without esophagitis: Secondary | ICD-10-CM | POA: Diagnosis not present

## 2024-10-01 DIAGNOSIS — B999 Unspecified infectious disease: Secondary | ICD-10-CM | POA: Diagnosis not present

## 2024-10-01 DIAGNOSIS — L2089 Other atopic dermatitis: Secondary | ICD-10-CM | POA: Diagnosis not present

## 2024-10-01 DIAGNOSIS — Z889 Allergy status to unspecified drugs, medicaments and biological substances status: Secondary | ICD-10-CM | POA: Diagnosis not present

## 2024-10-01 DIAGNOSIS — E782 Mixed hyperlipidemia: Secondary | ICD-10-CM | POA: Diagnosis not present

## 2024-10-01 DIAGNOSIS — K449 Diaphragmatic hernia without obstruction or gangrene: Secondary | ICD-10-CM | POA: Diagnosis not present

## 2024-10-01 DIAGNOSIS — D508 Other iron deficiency anemias: Secondary | ICD-10-CM | POA: Diagnosis not present

## 2024-10-01 DIAGNOSIS — F319 Bipolar disorder, unspecified: Secondary | ICD-10-CM | POA: Diagnosis not present

## 2024-10-01 DIAGNOSIS — J3089 Other allergic rhinitis: Secondary | ICD-10-CM | POA: Diagnosis not present

## 2024-10-01 DIAGNOSIS — M791 Myalgia, unspecified site: Secondary | ICD-10-CM | POA: Diagnosis not present

## 2024-10-01 DIAGNOSIS — R21 Rash and other nonspecific skin eruption: Secondary | ICD-10-CM | POA: Diagnosis not present

## 2024-10-01 DIAGNOSIS — K30 Functional dyspepsia: Secondary | ICD-10-CM | POA: Diagnosis not present

## 2024-10-01 NOTE — Therapy (Signed)
 OUTPATIENT PHYSICAL THERAPY THORACOLUMBAR TREATMENT   Patient Name: Rachel [name] MRN: [id number] DOB:[birth year], 38 y.o., female Today's Date: 10/01/2024  END OF SESSION:  PT End of Session - 10/01/24 0846     Visit Number 7    Date for Recertification  10/10/24    Authorization Type [facility]    Authorization - Visit Number 1    Authorization - Number of Visits 5    PT Start Time [phone]    PT Stop Time 0915    PT Time Calculation (min) 41 min    Activity Tolerance Patient tolerated treatment well    Behavior During Therapy [facility] for tasks assessed/performed           Past Medical History:  Diagnosis Date   ADHD (attention deficit hyperactivity disorder)    Anemia    Anxiety    Asthma    Bipolar disorder (HCC)    Complication of anesthesia    slow to wake up    Delayed gastric emptying 02/23/2020   Depression    DUB (dysfunctional uterine bleeding) 02/23/2020   Eczema    Ehlers-Danlos syndrome    Endometriosis    Gastroparesis    GERD (gastroesophageal reflux disease)    Heart murmur    Hiatal hernia 04/06/2020   Myalgia 04/06/2020   Nausea & vomiting 09/22/2022   Parasitic infection 02/23/2020   Pneumonia    Poor appetite 02/23/2020   Recurrent upper respiratory infection (URI)    RUQ pain 02/23/2020   Vitamin D  deficiency    Weight loss 02/23/2020   Past Surgical History:  Procedure Laterality Date   barium study     COLONOSCOPY     CYSTOSCOPY N/A 07/12/2021   Procedure: CYSTOSCOPY;  Surgeon: Zina Jerilynn LABOR, MD;  Location: [facility] OR;  Service: Gynecology;  Laterality: N/A;   ESOPHAGOGASTRODUODENOSCOPY  2011   and a ph study as well.    LAPAROSCOPIC GASTRIC RESECTION N/A 01/22/2023   Procedure: LAPAROSCOPIC GASTRIC PEXY;  Surgeon: Lyndel Deward PARAS, MD;  Location: [facility] ORS;  Service: General;  Laterality: N/A;   LAPAROSCOPY N/A 01/26/2021   Procedure: LAPAROSCOPY DIAGNOSTIC;  Surgeon: Zina Jerilynn LABOR, MD;  Location: [facility];  Service: Gynecology;  Laterality: N/A;   NASAL SEPTUM SURGERY     SINOSCOPY     TONGUE FLAP     TOTAL LAPAROSCOPIC HYSTERECTOMY WITH SALPINGECTOMY Bilateral 07/12/2021   Procedure: TOTAL LAPAROSCOPIC HYSTERECTOMY WITH SALPINGECTOMY, OOPHORECTOMY;  Surgeon: Zina Jerilynn LABOR, MD;  Location: [facility] OR;  Service: Gynecology;  Laterality: Bilateral;   UPPER GASTROINTESTINAL ENDOSCOPY     WISDOM TOOTH EXTRACTION     Patient Active Problem List   Diagnosis Date Noted   Ehlers-Danlos syndrome 08/05/2024   Acute stress reaction 06/14/2023   Chronic migraine w/o aura w/o status migrainosus, not intractable 02/23/2023   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   Memory loss 12/25/2022   Paresthesia 12/25/2022   Other allergic rhinitis 12/04/2022   Allergic conjunctivitis of both eyes 12/04/2022   Mild intermittent asthma without complication 12/04/2022   Other atopic dermatitis 12/04/2022   Allergic reaction 12/04/2022   Rash and other nonspecific skin eruption 12/04/2022   Multiple drug allergies 12/04/2022   Other adverse food reactions, not elsewhere classified, subsequent encounter 12/04/2022   Abnormal CT of the abdomen 09/23/2022   Abdominal pain 09/22/2022   Asthma, chronic 09/22/2022   HLD (hyperlipidemia) 09/22/2022   Nausea & vomiting 09/22/2022   Iron  deficiency anemia 01/20/2022   Acute hyponatremia 01/18/2022  Pyelonephritis 01/18/2022   Protein-calorie malnutrition, severe 01/18/2022   sepsis secondary to pyelonephritis of left kidney 01/17/2022   Sepsis (HCC) 01/17/2022   Chronic abdominal pain 01/17/2022   Dyspnea 01/17/2022   Underweight 01/17/2022   Irritable bowel syndrome (IBS) 01/17/2022   Hypokalemia 01/17/2022   Vasomotor symptoms due to menopause 08/17/2021   S/P laparoscopic hysterectomy 07/12/2021   Preoperative exam for gynecologic surgery 06/20/2021   Draining postoperative wound 02/28/2021   Encounter for postoperative care 02/16/2021   Endometriosis  determined by laparoscopy    Pelvic pain in female 10/11/2020   Presence of subdermal contraceptive implant 10/11/2020   Dysmenorrhea 10/11/2020   Dyspareunia in female 10/11/2020   NSVT (nonsustained ventricular tachycardia) (HCC) 09/03/2020   Gastroparesis 08/18/2020   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   Palpitations 07/26/2020   Family history of connective tissue disease 07/26/2020   Myalgia 04/06/2020   Hiatal hernia 04/06/2020   Recurrent infections 02/23/2020   Weight loss 02/23/2020   Poor appetite 02/23/2020   RUQ pain 02/23/2020   Delayed gastric emptying 02/23/2020   DUB (dysfunctional uterine bleeding) 02/23/2020   Cervical radiculopathy 05/31/2018   Hyperalgesia 05/31/2018   Muscle weakness 03/13/2018   GERD (gastroesophageal reflux disease) 12/26/2017   ADHD (attention deficit hyperactivity disorder) 05/07/2012   Bipolar 1 disorder (HCC) 05/07/2012    PCP: Corrina Sabin MD  REFERRING PROVIDER:   Dolphus Reiter, MD    REFERRING DIAG:  M79.10 (ICD-10-CM) - Myalgia  M25.50 (ICD-10-CM) - Hypermobility arthralgia    Rationale for Evaluation and Treatment: Rehabilitation  THERAPY DIAG:  Muscle weakness (generalized)  Other low back pain  Other abnormalities of gait and mobility  ONSET DATE: Chronic  SUBJECTIVE:                                                                                                                                                                                           SUBJECTIVE STATEMENT: Pt reports pain 7/10 today. Saw dermatologist yesterday.    Initial subjective Last year has been an nightmare. Mds think I have a autoimmune disease. Official dx with EDS and POTS. Dr Joane wants me back in aquatics because land puts me into my bed.  I have bee getting dizzy (due to POTS).  I have been able to come to the gym at times at least 1 time a week (40 minutes treadmill, some machines).  I have not been able to come to the pool  at the times that it is available  PERTINENT HISTORY:  Dr Deveshwar:Water  therapy/PT for hypermobility and myofascial pain syndrome.   Dr Joane: Evaluate and Treat for hypermobility and polyarthralgia 1-2 times  per week for 4-6 weeks.   Decrease pain, increase strength, flexibility, function, and range of motion.  Modalities may include, traction, ionto, phono, stim, and dry needling prn.  Gastroparesis, ADHD; bilpolar; hypermobility; Myofacial pain syndrome; EDS; dysautonomia   PAIN:  Are you having pain? Yes: NPRS scale: current 7/10; worst 9/10; least 6.5 Pain location: generally cervical spine neck shoulders and spine Pain description: Variable:achy, stiff painful sometimes radiating Aggravating factors: movement, upright sitting, standing Relieving factors: lying down; reclines, heating pad  PRECAUTIONS: None  RED FLAGS: None   WEIGHT BEARING RESTRICTIONS: No  FALLS:  Has patient fallen in last 6 months? Unknown  LIVING ENVIRONMENT:  Steps inside and outside   OCCUPATION: disabled  PLOF: Needs assistance with ADLs Variable  PATIENT GOALS: decrease pain, become more active  NEXT MD VISIT: as needed  OBJECTIVE:  Note: Objective measures were completed at Evaluation unless otherwise noted.   PATIENT SURVEYS:  ODI: 38/50= 76%  COGNITION: Overall cognitive status: Within functional limits for tasks assessed     SENSATION: generalized hyperalgesia   POSTURE: forward head  PALPATION: TTP throughout   LUMBAR ROM:   Hypermobile  LOWER EXTREMITY ROM:     Hypermobile  LOWER EXTREMITY MMT:    MMT Right eval Left eval  Hip flexion 4 4  Hip extension    Hip abduction    Hip adduction    Hip internal rotation    Hip external rotation    Knee flexion 4 4  Knee extension 4 4  Ankle dorsiflexion    Ankle plantarflexion    Ankle inversion    Ankle eversion     (Blank rows = not tested)   FUNCTIONAL TESTS:  Timed up and go (TUG): 18.86   4  stage balance:FT x 20s; Unable to complete semi-tandem; tandem; SLS GAIT: Distance walked: 400 ft Assistive device utilized: None Level of assistance: Complete Independence Comments: Gait slowed. Pause upon standing to ensure steadiness  TREATMENT  [facility] Adult PT Treatment:                                                DATE: 10/01/24 Pt seen for aquatic therapy today.  Treatment took place in water  3.5-4.75 ft in depth at the [facility] pool. Temp of water  was 91.  Pt entered/exited the pool via stairs using step through pattern with hand rail.   *walking forward, back and side stepping in 3.6 ft with ue support of solid noodle  - seated rest periods *PPT and hip hiking seated swing like on solid noodle  - squatted rest period *suitcase carry using 1/4 noodle  bilat and unilateral forward and backward *TrA set using 1/2 noodle wide stance x 8; staggered stances x 5   - squatted rest period *decompression noodle wrapped anteriorly ue support corner wall: hip add/abd; flex/ex  Pt requires the buoyancy and hydrostatic pressure of water  for support, and to offload joints by unweighting joint load by at least 50 % in navel deep water  and by at least 75-80% in chest to neck deep water .  Viscosity of the water  is needed for resistance of strengthening. Water  current perturbations provides challenge to standing balance requiring increased core activation.  PATIENT EDUCATION:  Education details: Discussed eval findings, rehab rationale, aquatic program progression/POC and pools in area. Patient is in agreement  Person educated: Patient Education method: Explanation Education comprehension: verbalized understanding  HOME EXERCISE PROGRAM: From previous episode x 1 yr ago: Access Code: [email or web address]   ASSESSMENT:  CLINICAL IMPRESSION: Pt has had an extension on ins auth.  She reports some pelvic discomfort today. She continues to have  high stress levels with events and situations at home and with MD's which affect pain levels. She has fair toleration to added core strengthening.  Requires frequent rest periods (normal).  She will continue to benefit from skilled PT to reduce all deficits and manage chronic pain.  Recert next session         Initial Impression Patient is a 38 y.o. f who was seen today for physical therapy evaluation and treatment for hypomobility and myofacial pain syndrome. She is well known to this clinic having been seen multiple times in aquatics with similar dx.  She returns today with added dx of dysautonomia, POTS and possible autoimmune disorder. Patient presents with general pain limited deficits throughout shoulders and entire spine effecting endurance, activity tolerance, gait, balance, and functional mobility with ADL's. As indicated by her outcomes measure score, subjective information and objective testing she is having to modify all activities for safety.  She will benefit from aquatic physical therapy intervention to improve all areas of deficits and instruct on aquatic HEP encouraging a more frequent exercise regimen.   OBJECTIVE IMPAIRMENTS: decreased activity tolerance, decreased balance, decreased endurance, decreased mobility, decreased strength, dizziness, pain, and hypermobility.   ACTIVITY LIMITATIONS: carrying, lifting, bending, sitting, standing, squatting, stairs, and locomotion level  PARTICIPATION LIMITATIONS: meal prep, cleaning, laundry, driving, shopping, community activity, occupation, and yard work  PERSONAL FACTORS: Behavior pattern, Past/current experiences, Time since onset of injury/illness/exacerbation, Transportation, and 1-2 comorbidities: See PmHx are also affecting patient's functional outcome.   REHAB POTENTIAL: Fair chronicity of condition  CLINICAL DECISION MAKING: Stable/uncomplicated  EVALUATION COMPLEXITY: Low   GOALS: Goals reviewed with patient?  Yes  SHORT TERM GOALS: Target date: 10/25  Pt will tolerate full aquatic sessions consistently without increase in pain and with improving function to demonstrate good toleration and effectiveness of intervention.  Baseline: Goal status:Met 09/17/24   2.  Pt will tolerate amb 5 consecutive minutes submerged to demonstrates progression of toleration to activity Baseline:  Goal status: in progress 09/17/24; Met 09/29/24   LONG TERM GOALS: Target date: 10/10/24  Pt to improve on ODI by 13% to demonstrate statistically significant Improvement in function. (MCID 13-15%) Baseline:38/50= 76% Goal status: INITIAL  2.  Pt will report return to routine exercise regimen Baseline:  Goal status: In progress 09/29/24  3.  Pt will report decrease in pain by at least 50% for improved toleration to activity/quality of life and to demonstrate improved management of pain. Baseline:  Goal status: In progress 11/17/25NITIAL  4.  Pt will be indep with final HEP's (land and aquatic as appropriate) for continued management of condition Baseline:  Goal status: INITIAL  5.  Pt will tolerate full 30 minutes of aquatic exercise to demonstrate an improvement in activity toleration Baseline:  Goal status: In progress 09/29/24   PLAN:  PT FREQUENCY: 1-2x/week  PT DURATION: 6 weeks   PLANNED INTERVENTIONS:  97750- Physical Performance Testing, 97110-Therapeutic exercises, 97530- Therapeutic activity, [id number]- Neuromuscular re-education, 97535- Self Care, 02859- Manual therapy, [id number]- Gait training, [phone]- Aquatic Therapy, [phone] (1-2 muscles), 20561 (3+ muscles)- Dry Needling,  Patient/Family education, Balance training, Stair training, Taping, Joint mobilization, DME instructions, Cryotherapy, and Moist heat.  PLAN FOR NEXT SESSION: aquatics only: general strengthening; endurance training; pain management   Ronal Foots) Dejean Tribby MPT 10/01/24 8:53 AM [facility] [address] [place], [place], [address] Phone: [phone]   Fax:  [phone]  For all possible CPT codes, reference the Planned Interventions line above.     Check all conditions that are expected to impact treatment: {Conditions expected to impact treatment:Musculoskeletal disorders   If treatment provided at initial evaluation, no treatment charged due to lack of authorization.

## 2024-10-02 NOTE — BH Specialist Note (Unsigned)
 Integrated Behavioral Health via Telemedicine Visit  10/08/2024 Rachel [name] [id number]  Number of Integrated Behavioral Health Clinician visits: Additional Visit  Session Start time: 1056   Session End time: 1223  Total time in minutes: 87  Referring Provider: Jerilynn Buddle, MD Patient/Family location: [facility] Provider location: [facility]  All persons participating in visit: [name] and [facility] Rachel [name]   Types of Service: Individual psychotherapy and Telephone visit  I connected with Rachel [name] [name]'s n/a via  Telephone or Video Enabled Telemedicine Application  (Video is Caregility application) and verified that I am speaking with the correct person using two identifiers. Discussed confidentiality: Yes   I discussed the limitations of telemedicine and the availability of in person appointments.  Discussed there is a possibility of technology failure and discussed alternative modes of communication if that failure occurs.  I discussed that engaging in this telemedicine visit, they consent to the provision of behavioral healthcare and the services will be billed under their insurance.  Patient and/or legal guardian expressed understanding and consented to Telemedicine visit: Yes   Presenting Concerns: Patient and/or family reports the following symptoms/concerns: Anxious and worried about upcoming health appointments, as well as disability process; thankful Medicaid will remain after error with maintaining.  Duration of problem: Ongoing; Severity of problem: moderate  Patient and/or Family's Strengths/Protective Factors: Social connections, Concrete supports in place (healthy food, safe environments, etc.), and Sense of purpose  Goals Addressed: Patient will:  Reduce symptoms of: anxiety, depression, and stress   Demonstrate ability to:  Increase motivation to adhere to plan of care  Progress towards Goals: Ongoing  Interventions: Interventions utilized:  Motivational Interviewing and Supportive Reflection Standardized Assessments completed: Not Needed  Patient and/or Family Response: Patient agrees with treatment plan.  Clinical Assessment/Diagnosis  Post traumatic stress disorder (PTSD)  Attention deficit hyperactivity disorder (ADHD), unspecified ADHD type  Psychosocial stressors   Patient may benefit from continued therapeutic intervention.  Plan: Follow up with behavioral health clinician on : Two weeks Behavioral recommendations:  -Continue taking Vyvanse  and hydroxyzine  as prescribed -Continue daily self-coping strategies (time with supportive people in life; healthy self-care; healthy boundaries; self-advocacy; creative projects) -Continue plan to attend specialist medical appointments today; disability application as soon as able -Continue plan to enjoy Friendsgiving dinner with friends Referral(s): Integrated [employer] (In Clinic)  I discussed the assessment and treatment plan with the patient and/or parent/guardian. They were provided an opportunity to ask questions and all were answered. They agreed with the plan and demonstrated an understanding of the instructions.   They were advised to call back or seek an in-person evaluation if the symptoms worsen or if the condition fails to improve as anticipated.  [name], LCSW     07/28/2024    9:13 AM 01/23/2024    9:08 AM 04/23/2023    8:50 AM 11/02/2022    9:23 AM 08/02/2022    2:20 PM  Depression screen PHQ 2/9  Decreased Interest 1 2 0 0 0  Down, Depressed, Hopeless 0 2 1 1  0  PHQ - 2 Score 1 4 1 1  0  Altered sleeping 2 3 2 1 2   Tired, decreased energy 2 3 2 1 2   Change in appetite 1 3 2 1 1   Feeling bad or failure about yourself  0 0 0 0 0  Trouble concentrating 2 2 1  0 1  Moving slowly or fidgety/restless  0 0 0 0 0   Suicidal thoughts 0 0 0 0 0  PHQ-9 Score 8  15  8  4  6       Data saved with a previous flowsheet row definition      01/23/2024    9:10 AM 04/23/2023    8:51 AM 11/02/2022    9:24 AM 08/02/2022    2:20 PM  GAD 7 : Generalized Anxiety Score  Nervous, Anxious, on Edge 2 2 1  0  Control/stop worrying 2 1 0 0  Worry too much - different things 2 0 0 0  Trouble relaxing 0 0 1 1  Restless 0 0 0 0  Easily annoyed or irritable 0 0 0 0  Afraid - awful might happen 3 0 0 0  Total GAD 7 Score 9 3 2  1

## 2024-10-03 ENCOUNTER — Telehealth: Payer: Self-pay | Admitting: Family Medicine

## 2024-10-03 MED ORDER — LISDEXAMFETAMINE DIMESYLATE 20 MG PO CAPS: 20.0000 mg | ORAL_CAPSULE | Freq: Every day | ORAL | 0 refills | Status: DC

## 2024-10-03 NOTE — Telephone Encounter (Signed)
Vyvanse refill sent to pharmacy

## 2024-10-03 NOTE — Telephone Encounter (Signed)
 Pt calling for refill of Vyvance to [facility].  Only has a couple of pills left.

## 2024-10-06 ENCOUNTER — Ambulatory Visit (HOSPITAL_BASED_OUTPATIENT_CLINIC_OR_DEPARTMENT_OTHER): Admitting: Physical Therapy

## 2024-10-06 ENCOUNTER — Encounter (HOSPITAL_BASED_OUTPATIENT_CLINIC_OR_DEPARTMENT_OTHER): Payer: Self-pay | Admitting: Physical Therapy

## 2024-10-06 DIAGNOSIS — M6281 Muscle weakness (generalized): Secondary | ICD-10-CM

## 2024-10-06 DIAGNOSIS — M791 Myalgia, unspecified site: Secondary | ICD-10-CM | POA: Diagnosis not present

## 2024-10-06 DIAGNOSIS — K582 Mixed irritable bowel syndrome: Secondary | ICD-10-CM | POA: Diagnosis not present

## 2024-10-06 DIAGNOSIS — G43709 Chronic migraine without aura, not intractable, without status migrainosus: Secondary | ICD-10-CM | POA: Diagnosis not present

## 2024-10-06 DIAGNOSIS — E782 Mixed hyperlipidemia: Secondary | ICD-10-CM | POA: Diagnosis not present

## 2024-10-06 DIAGNOSIS — D508 Other iron deficiency anemias: Secondary | ICD-10-CM | POA: Diagnosis not present

## 2024-10-06 DIAGNOSIS — F319 Bipolar disorder, unspecified: Secondary | ICD-10-CM | POA: Diagnosis not present

## 2024-10-06 DIAGNOSIS — R109 Unspecified abdominal pain: Secondary | ICD-10-CM | POA: Diagnosis not present

## 2024-10-06 DIAGNOSIS — F902 Attention-deficit hyperactivity disorder, combined type: Secondary | ICD-10-CM | POA: Diagnosis not present

## 2024-10-06 DIAGNOSIS — L2089 Other atopic dermatitis: Secondary | ICD-10-CM | POA: Diagnosis not present

## 2024-10-06 DIAGNOSIS — G8929 Other chronic pain: Secondary | ICD-10-CM | POA: Diagnosis not present

## 2024-10-06 DIAGNOSIS — B999 Unspecified infectious disease: Secondary | ICD-10-CM | POA: Diagnosis not present

## 2024-10-06 DIAGNOSIS — M5459 Other low back pain: Secondary | ICD-10-CM

## 2024-10-06 DIAGNOSIS — J4520 Mild intermittent asthma, uncomplicated: Secondary | ICD-10-CM | POA: Diagnosis not present

## 2024-10-06 DIAGNOSIS — K219 Gastro-esophageal reflux disease without esophagitis: Secondary | ICD-10-CM | POA: Diagnosis not present

## 2024-10-06 DIAGNOSIS — J3089 Other allergic rhinitis: Secondary | ICD-10-CM | POA: Diagnosis not present

## 2024-10-06 DIAGNOSIS — K30 Functional dyspepsia: Secondary | ICD-10-CM | POA: Diagnosis not present

## 2024-10-06 DIAGNOSIS — R7689 Other specified abnormal immunological findings in serum: Secondary | ICD-10-CM | POA: Diagnosis not present

## 2024-10-06 DIAGNOSIS — R2689 Other abnormalities of gait and mobility: Secondary | ICD-10-CM | POA: Diagnosis not present

## 2024-10-06 DIAGNOSIS — K449 Diaphragmatic hernia without obstruction or gangrene: Secondary | ICD-10-CM | POA: Diagnosis not present

## 2024-10-06 DIAGNOSIS — Z889 Allergy status to unspecified drugs, medicaments and biological substances status: Secondary | ICD-10-CM | POA: Diagnosis not present

## 2024-10-06 DIAGNOSIS — R208 Other disturbances of skin sensation: Secondary | ICD-10-CM | POA: Diagnosis not present

## 2024-10-06 DIAGNOSIS — R21 Rash and other nonspecific skin eruption: Secondary | ICD-10-CM | POA: Diagnosis not present

## 2024-10-06 DIAGNOSIS — M2550 Pain in unspecified joint: Secondary | ICD-10-CM | POA: Diagnosis not present

## 2024-10-06 NOTE — Addendum Note (Signed)
 Addended by: Kelise Kuch F on: 10/06/2024 03:44 PM   Modules accepted: Orders

## 2024-10-06 NOTE — Therapy (Signed)
 OUTPATIENT PHYSICAL THERAPY THORACOLUMBAR TREATMENT Progress Note/ re-cert Reporting Period 08/25/24 to 10/06/24  See note below for Objective Data and Assessment of Progress/Goals.      Patient Name: Rachel [name] MRN: [id number] DOB:1986/03/31, 38 y.o., female Today's Date: 10/06/2024  END OF SESSION:  PT End of Session - 10/06/24 0943     Visit Number 8    Date for Recertification  11/21/24    Authorization Type [facility]    Authorization - Visit Number 2    Authorization - Number of Visits 5    PT Start Time [phone]    PT Stop Time 1010    PT Time Calculation (min) 39 min    Activity Tolerance Patient tolerated treatment well    Behavior During Therapy [facility] for tasks assessed/performed           Past Medical History:  Diagnosis Date   ADHD (attention deficit hyperactivity disorder)    Anemia    Anxiety    Asthma    Bipolar disorder (HCC)    Complication of anesthesia    slow to wake up    Delayed gastric emptying 02/23/2020   Depression    DUB (dysfunctional uterine bleeding) 02/23/2020   Eczema    Ehlers-Danlos syndrome    Endometriosis    Gastroparesis    GERD (gastroesophageal reflux disease)    Heart murmur    Hiatal hernia 04/06/2020   Myalgia 04/06/2020   Nausea & vomiting 09/22/2022   Parasitic infection 02/23/2020   Pneumonia    Poor appetite 02/23/2020   Recurrent upper respiratory infection (URI)    RUQ pain 02/23/2020   Vitamin D  deficiency    Weight loss 02/23/2020   Past Surgical History:  Procedure Laterality Date   barium study     COLONOSCOPY     CYSTOSCOPY N/A 07/12/2021   Procedure: CYSTOSCOPY;  Surgeon: Zina Jerilynn LABOR, MD;  Location: [facility] OR;  Service: Gynecology;  Laterality: N/A;   ESOPHAGOGASTRODUODENOSCOPY  2011   and a ph study as well.    LAPAROSCOPIC GASTRIC RESECTION N/A 01/22/2023   Procedure: LAPAROSCOPIC GASTRIC PEXY;  Surgeon: Lyndel Deward PARAS, MD;  Location: [facility] ORS;  Service: General;   Laterality: N/A;   LAPAROSCOPY N/A 01/26/2021   Procedure: LAPAROSCOPY DIAGNOSTIC;  Surgeon: Zina Jerilynn LABOR, MD;  Location: [facility];  Service: Gynecology;  Laterality: N/A;   NASAL SEPTUM SURGERY     SINOSCOPY     TONGUE FLAP     TOTAL LAPAROSCOPIC HYSTERECTOMY WITH SALPINGECTOMY Bilateral 07/12/2021   Procedure: TOTAL LAPAROSCOPIC HYSTERECTOMY WITH SALPINGECTOMY, OOPHORECTOMY;  Surgeon: Zina Jerilynn LABOR, MD;  Location: [facility] OR;  Service: Gynecology;  Laterality: Bilateral;   UPPER GASTROINTESTINAL ENDOSCOPY     WISDOM TOOTH EXTRACTION     Patient Active Problem List   Diagnosis Date Noted   Ehlers-Danlos syndrome 08/05/2024   Acute stress reaction 06/14/2023   Chronic migraine w/o aura w/o status migrainosus, not intractable 02/23/2023   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   Memory loss 12/25/2022   Paresthesia 12/25/2022   Other allergic rhinitis 12/04/2022   Allergic conjunctivitis of both eyes 12/04/2022   Mild intermittent asthma without complication 12/04/2022   Other atopic dermatitis 12/04/2022   Allergic reaction 12/04/2022   Rash and other nonspecific skin eruption 12/04/2022   Multiple drug allergies 12/04/2022   Other adverse food reactions, not elsewhere classified, subsequent encounter 12/04/2022   Abnormal CT of the abdomen 09/23/2022   Abdominal pain 09/22/2022   Asthma, chronic 09/22/2022  HLD (hyperlipidemia) 09/22/2022   Nausea & vomiting 09/22/2022   Iron  deficiency anemia 01/20/2022   Acute hyponatremia 01/18/2022   Pyelonephritis 01/18/2022   Protein-calorie malnutrition, severe 01/18/2022   sepsis secondary to pyelonephritis of left kidney 01/17/2022   Sepsis (HCC) 01/17/2022   Chronic abdominal pain 01/17/2022   Dyspnea 01/17/2022   Underweight 01/17/2022   Irritable bowel syndrome (IBS) 01/17/2022   Hypokalemia 01/17/2022   Vasomotor symptoms due to menopause 08/17/2021   S/P laparoscopic hysterectomy 07/12/2021   Preoperative exam for  gynecologic surgery 06/20/2021   Draining postoperative wound 02/28/2021   Encounter for postoperative care 02/16/2021   Endometriosis determined by laparoscopy    Pelvic pain in female 10/11/2020   Presence of subdermal contraceptive implant 10/11/2020   Dysmenorrhea 10/11/2020   Dyspareunia in female 10/11/2020   NSVT (nonsustained ventricular tachycardia) (HCC) 09/03/2020   Gastroparesis 08/18/2020   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   Palpitations 07/26/2020   Family history of connective tissue disease 07/26/2020   Myalgia 04/06/2020   Hiatal hernia 04/06/2020   Recurrent infections 02/23/2020   Weight loss 02/23/2020   Poor appetite 02/23/2020   RUQ pain 02/23/2020   Delayed gastric emptying 02/23/2020   DUB (dysfunctional uterine bleeding) 02/23/2020   Cervical radiculopathy 05/31/2018   Hyperalgesia 05/31/2018   Muscle weakness 03/13/2018   GERD (gastroesophageal reflux disease) 12/26/2017   ADHD (attention deficit hyperactivity disorder) 05/07/2012   Bipolar 1 disorder (HCC) 05/07/2012    PCP: Corrina Sabin MD  REFERRING PROVIDER:   Dolphus Reiter, MD    REFERRING DIAG:  M79.10 (ICD-10-CM) - Myalgia  M25.50 (ICD-10-CM) - Hypermobility arthralgia    Rationale for Evaluation and Treatment: Rehabilitation  THERAPY DIAG:  Muscle weakness (generalized)  Other low back pain  Other abnormalities of gait and mobility  ONSET DATE: Chronic  SUBJECTIVE:                                                                                                                                                                                           SUBJECTIVE STATEMENT: I have been more swollen for the past few days. Would like to work on reducing that. Pt reports pain 8/10 today.  Is interested in some exercises for core that I can do on my bed.  Initial subjective Last year has been an nightmare. Mds think I have a autoimmune disease. Official dx with EDS and POTS.  Dr Joane wants me back in aquatics because land puts me into my bed.  I have bee getting dizzy (due to POTS).  I have been able to come to the gym at times at least 1 time a week (  40 minutes treadmill, some machines).  I have not been able to come to the pool at the times that it is available  PERTINENT HISTORY:  Dr Deveshwar:Water  therapy/PT for hypermobility and myofascial pain syndrome.   Dr Joane: Evaluate and Treat for hypermobility and polyarthralgia 1-2 times per week for 4-6 weeks.   Decrease pain, increase strength, flexibility, function, and range of motion.  Modalities may include, traction, ionto, phono, stim, and dry needling prn.  Gastroparesis, ADHD; bilpolar; hypermobility; Myofacial pain syndrome; EDS; dysautonomia   PAIN:  Are you having pain? Yes: NPRS scale: current 7/10; worst 9/10; least 6.5 Pain location: generally cervical spine neck shoulders and spine Pain description: Variable:achy, stiff painful sometimes radiating Aggravating factors: movement, upright sitting, standing Relieving factors: lying down; reclines, heating pad  PRECAUTIONS: None  RED FLAGS: None   WEIGHT BEARING RESTRICTIONS: No  FALLS:  Has patient fallen in last 6 months? Unknown  LIVING ENVIRONMENT:  Steps inside and outside   OCCUPATION: disabled  PLOF: Needs assistance with ADLs Variable  PATIENT GOALS: decrease pain, become more active  NEXT MD VISIT: as needed  OBJECTIVE:  Note: Objective measures were completed at Evaluation unless otherwise noted.   PATIENT SURVEYS:  ODI: 38/50= 76% 10/06/24:  38/50  COGNITION: Overall cognitive status: Within functional limits for tasks assessed     SENSATION: generalized hyperalgesia   POSTURE: forward head  PALPATION: TTP throughout   LUMBAR ROM:   Hypermobile  LOWER EXTREMITY ROM:     Hypermobile  LOWER EXTREMITY MMT:    MMT Right eval Left eval R / L 10/06/24  Hip flexion 4 4 4+  Hip extension     Hip  abduction     Hip adduction     Hip internal rotation     Hip external rotation     Knee flexion 4 4 4+  Knee extension 4 4 4+  Ankle dorsiflexion     Ankle plantarflexion     Ankle inversion     Ankle eversion      (Blank rows = not tested)   FUNCTIONAL TESTS:  Timed up and go (TUG): 18.86   4 stage balance:FT x 20s; Unable to complete semi-tandem; tandem; SLS      10/06/24: semi tandem:assistance to gain positon hold x 5s GAIT: Distance walked: 400 ft Assistive device utilized: None Level of assistance: Complete Independence Comments: Gait slowed. Pause upon standing to ensure steadiness  TREATMENT  [facility] Adult PT Treatment:                                                DATE: 10/06/24 Pt seen for aquatic therapy today.  Treatment took place in water  3.5-4.75 ft in depth at the [facility] pool. Temp of water  was 91.  Pt entered/exited the pool via stairs using step through pattern with hand rail.   *walking forward, back and side stepping in 3.6 4.3-ft with ue support of solid noodle  - seated rest periods *Noodle wrapped anteriorly : hip add/abd; flex/ex  - decompression in above position *TrA set using full noodle wide stance x 8; staggered stances x 5   - squatted rest period *PPT and hip hiking seated swing like on solid noodle  - squatted rest period *suitcase carry using 1/4 noodle  bilat and unilateral forward and backward  Pt requires the buoyancy and  hydrostatic pressure of water  for support, and to offload joints by unweighting joint load by at least 50 % in navel deep water  and by at least 75-80% in chest to neck deep water .  Viscosity of the water  is needed for resistance of strengthening. Water  current perturbations provides challenge to standing balance requiring increased core activation.                                                 PATIENT EDUCATION:  Education details: Discussed eval findings, rehab rationale, aquatic program  progression/POC and pools in area. Patient is in agreement  Person educated: Patient Education method: Explanation Education comprehension: verbalized understanding  HOME EXERCISE PROGRAM: From previous episode x 1 yr ago: Access Code: [facility]   Land based Access Code: [email or web address] URL: [email or web address] Date: 10/06/2024 Prepared by: Frankie Shameika Speelman Not issued   ASSESSMENT:  CLINICAL IMPRESSION: Re-cert: Pt has made steady albeit slow progress as to be expected with chronicity of conditions.  She has been consistent with her participation without miss in aquatic PT sessions and putting forth good effort.  She is tolerating longer time in sessions without limitation to pain or fatigue.  Goals addressed 11/17 which support her progression, not added below. ODI completed today although no change, slight increase in strength and balance as in above charts.  Pt declining land based interventions as she believes she will not tolerate.  Will plan to create a HEP to be completed at home in sup for core strengthening as per her request.  She will continue to benefit from skilled aquatic PT with continued progression towards stated goals.    Initial Impression Patient is a 38 y.o. f who was seen today for physical therapy evaluation and treatment for hypomobility and myofacial pain syndrome. She is well known to this clinic having been seen multiple times in aquatics with similar dx.  She returns today with added dx of dysautonomia, POTS and possible autoimmune disorder. Patient presents with general pain limited deficits throughout shoulders and entire spine effecting endurance, activity tolerance, gait, balance, and functional mobility with ADL's. As indicated by her outcomes measure score, subjective information and objective testing she is having to modify all activities for safety.  She will benefit from aquatic physical therapy intervention to improve all areas of deficits and  instruct on aquatic HEP encouraging a more frequent exercise regimen.   OBJECTIVE IMPAIRMENTS: decreased activity tolerance, decreased balance, decreased endurance, decreased mobility, decreased strength, dizziness, pain, and hypermobility.   ACTIVITY LIMITATIONS: carrying, lifting, bending, sitting, standing, squatting, stairs, and locomotion level  PARTICIPATION LIMITATIONS: meal prep, cleaning, laundry, driving, shopping, community activity, occupation, and yard work  PERSONAL FACTORS: Behavior pattern, Past/current experiences, Time since onset of injury/illness/exacerbation, Transportation, and 1-2 comorbidities: See PmHx are also affecting patient's functional outcome.   REHAB POTENTIAL: Fair chronicity of condition  CLINICAL DECISION MAKING: Stable/uncomplicated  EVALUATION COMPLEXITY: Low   GOALS: Goals reviewed with patient? Yes  SHORT TERM GOALS: Target date: 10/25  Pt will tolerate full aquatic sessions consistently without increase in pain and with improving function to demonstrate good toleration and effectiveness of intervention.  Baseline: Goal status:Met 09/17/24   2.  Pt will tolerate amb 5 consecutive minutes submerged to demonstrates progression of toleration to activity Baseline:  Goal status: in progress 09/17/24; Met 09/29/24   LONG TERM GOALS: Target date: 11/21/24  Pt to improve on ODI by 13% to demonstrate statistically significant Improvement in function. (MCID 13-15%) Baseline:38/50= 76% Goal status: In progress 10/06/24  2.  Pt will report return to routine exercise regimen Baseline:  Goal status: In progress 09/29/24  3.  Pt will report decrease in pain by at least 50% for improved toleration to activity/quality of life and to demonstrate improved management of pain. Baseline:  Goal status: In progress 09/29/24  4.  Pt will be indep with final HEP's (land and aquatic as appropriate) for continued management of condition Baseline:  Goal status:  INITIAL  5.  Pt will tolerate full 30 minutes of aquatic exercise to demonstrate an improvement in activity toleration Baseline:  Goal status: In progress 09/29/24   PLAN:  PT FREQUENCY: 1-2x/week  PT DURATION: 6 weeks   PLANNED INTERVENTIONS:  97750- Physical Performance Testing, 97110-Therapeutic exercises, 97530- Therapeutic activity, 97112- Neuromuscular re-education, 97535- Self Care, 02859- Manual therapy, [phone]- Gait training, [phone]- Aquatic Therapy, [phone] (1-2 muscles), [phone] (3+ muscles)- Dry Needling, Patient/Family education, Balance training, Stair training, Taping, Joint mobilization, DME instructions, Cryotherapy, and Moist heat.  PLAN FOR NEXT SESSION: aquatics only: general strengthening; endurance training; pain management   Ronal Foots) Kendelle Schweers MPT 10/06/24 11:15 AM [facility] [address] [place], [place], [address] Phone: [phone]   Fax:  [phone]  For all possible CPT codes, reference the Planned Interventions line above.     Check all conditions that are expected to impact treatment: {Conditions expected to impact treatment:Musculoskeletal disorders   If treatment provided at initial evaluation, no treatment charged due to lack of authorization.

## 2024-10-07 ENCOUNTER — Ambulatory Visit: Admitting: Family Medicine

## 2024-10-07 ENCOUNTER — Ambulatory Visit: Admitting: Clinical

## 2024-10-07 ENCOUNTER — Ambulatory Visit: Admitting: Rheumatology

## 2024-10-07 ENCOUNTER — Encounter: Payer: Self-pay | Admitting: Rheumatology

## 2024-10-07 ENCOUNTER — Encounter: Payer: Self-pay | Admitting: Family Medicine

## 2024-10-07 VITALS — BP 102/80 | HR 79 | Ht 61.81 in

## 2024-10-07 VITALS — BP 118/82 | HR 82 | Temp 98.0°F | Resp 15 | Ht 61.81 in | Wt 110.0 lb

## 2024-10-07 DIAGNOSIS — R208 Other disturbances of skin sensation: Secondary | ICD-10-CM

## 2024-10-07 DIAGNOSIS — F902 Attention-deficit hyperactivity disorder, combined type: Secondary | ICD-10-CM

## 2024-10-07 DIAGNOSIS — K449 Diaphragmatic hernia without obstruction or gangrene: Secondary | ICD-10-CM

## 2024-10-07 DIAGNOSIS — M5459 Other low back pain: Secondary | ICD-10-CM | POA: Diagnosis not present

## 2024-10-07 DIAGNOSIS — J4520 Mild intermittent asthma, uncomplicated: Secondary | ICD-10-CM | POA: Diagnosis not present

## 2024-10-07 DIAGNOSIS — F909 Attention-deficit hyperactivity disorder, unspecified type: Secondary | ICD-10-CM

## 2024-10-07 DIAGNOSIS — Z889 Allergy status to unspecified drugs, medicaments and biological substances status: Secondary | ICD-10-CM

## 2024-10-07 DIAGNOSIS — F4310 Post-traumatic stress disorder, unspecified: Secondary | ICD-10-CM | POA: Diagnosis not present

## 2024-10-07 DIAGNOSIS — G43709 Chronic migraine without aura, not intractable, without status migrainosus: Secondary | ICD-10-CM | POA: Diagnosis not present

## 2024-10-07 DIAGNOSIS — B999 Unspecified infectious disease: Secondary | ICD-10-CM | POA: Diagnosis not present

## 2024-10-07 DIAGNOSIS — K582 Mixed irritable bowel syndrome: Secondary | ICD-10-CM

## 2024-10-07 DIAGNOSIS — F319 Bipolar disorder, unspecified: Secondary | ICD-10-CM

## 2024-10-07 DIAGNOSIS — D508 Other iron deficiency anemias: Secondary | ICD-10-CM | POA: Diagnosis not present

## 2024-10-07 DIAGNOSIS — Z90710 Acquired absence of both cervix and uterus: Secondary | ICD-10-CM

## 2024-10-07 DIAGNOSIS — G8929 Other chronic pain: Secondary | ICD-10-CM

## 2024-10-07 DIAGNOSIS — L2089 Other atopic dermatitis: Secondary | ICD-10-CM | POA: Diagnosis not present

## 2024-10-07 DIAGNOSIS — K219 Gastro-esophageal reflux disease without esophagitis: Secondary | ICD-10-CM | POA: Diagnosis not present

## 2024-10-07 DIAGNOSIS — M791 Myalgia, unspecified site: Secondary | ICD-10-CM

## 2024-10-07 DIAGNOSIS — G901 Familial dysautonomia [Riley-Day]: Secondary | ICD-10-CM

## 2024-10-07 DIAGNOSIS — M6281 Muscle weakness (generalized): Secondary | ICD-10-CM | POA: Diagnosis not present

## 2024-10-07 DIAGNOSIS — R2689 Other abnormalities of gait and mobility: Secondary | ICD-10-CM | POA: Diagnosis not present

## 2024-10-07 DIAGNOSIS — J3089 Other allergic rhinitis: Secondary | ICD-10-CM | POA: Diagnosis not present

## 2024-10-07 DIAGNOSIS — R7689 Other specified abnormal immunological findings in serum: Secondary | ICD-10-CM

## 2024-10-07 DIAGNOSIS — E782 Mixed hyperlipidemia: Secondary | ICD-10-CM

## 2024-10-07 DIAGNOSIS — Z658 Other specified problems related to psychosocial circumstances: Secondary | ICD-10-CM

## 2024-10-07 DIAGNOSIS — R21 Rash and other nonspecific skin eruption: Secondary | ICD-10-CM | POA: Diagnosis not present

## 2024-10-07 DIAGNOSIS — Q796 Ehlers-Danlos syndrome, unspecified: Secondary | ICD-10-CM | POA: Diagnosis not present

## 2024-10-07 DIAGNOSIS — K30 Functional dyspepsia: Secondary | ICD-10-CM

## 2024-10-07 DIAGNOSIS — M2550 Pain in unspecified joint: Secondary | ICD-10-CM

## 2024-10-07 DIAGNOSIS — R109 Unspecified abdominal pain: Secondary | ICD-10-CM | POA: Diagnosis not present

## 2024-10-07 DIAGNOSIS — Z8619 Personal history of other infectious and parasitic diseases: Secondary | ICD-10-CM

## 2024-10-07 MED ORDER — LISDEXAMFETAMINE DIMESYLATE 20 MG PO CAPS: 20.0000 mg | ORAL_CAPSULE | Freq: Every day | ORAL | 0 refills | Status: DC

## 2024-10-07 NOTE — Progress Notes (Unsigned)
   I, Leotis Batter, CMA acting as a scribe for Artist Lloyd, MD.  Rachel [name] is a 38 y.o. female who presents to [facility] today for follow up of hypermobile Ehler's Danlos. Pt was last seen by Dr. Lloyd on 09/04/24, PT was recommended, Continue Montelukast  and start Cromolyn  for abd sx, increase Vyvanse  to 20 mg for braine fog / ADHD sx, and to continue salt and exercises for POTS  Today, patient reports intermittent fluid retention. Scheduled for second opinion with Rheumatologist for Lupus. Increased stiffness, hives, rashes, blistering, butterfly rash on the face.   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  She was offered hydroxychloroquine and is still considering her options.   Pertinent review of systems: No fevers or chills  Relevant historical information: EDS   Exam:  BP 102/80   Pulse 79   Ht 5' 1.81 (1.57 m)   LMP  (LMP Unknown)   SpO2 99%   BMI 20.24 kg/m  General: Well Developed, well nourished, and in no acute distress.   MSK: Normal gait.    Lab and Radiology Results No results found for this or any previous visit (from the past 72 hours). No results found.     Assessment and Plan: 38 y.o. 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  Patient did have a consultation with rheumatology today and was offered hydroxychloroquine and is still thinking about starting the medicine.  She is anxious about side effects of course.  Recommend that she start it and check back with me in about 3 weeks.  Additionally at the previous visit I prescribed Vyvanse  which does seem to be helpful.  Plan to continue current dose of Vyvanse .  Continue PT/home exercise program.  Recheck in 3 weeks.   PDMP not reviewed this encounter. No orders of the defined types were placed in this  encounter.  Meds ordered this encounter  Medications   lisdexamfetamine (VYVANSE ) 20 MG capsule    Sig: Take 1 capsule (20 mg total) by mouth daily.    Dispense:  30 capsule    Refill:  0     Discussed warning signs or symptoms. Please see discharge instructions. Patient expresses understanding.   The above documentation has been reviewed and is accurate and complete Artist Lloyd, M.D. Total encounter time 30 minutes including face-to-face time with the patient and, reviewing past medical record, and charting on the date of service.

## 2024-10-07 NOTE — Patient Instructions (Addendum)
 Thank you for coming in today.   I recommend reaching out to Dr. Dolphus, consider starting on the Hydroxychloroquine.   See you back the end of December

## 2024-10-08 ENCOUNTER — Telehealth: Payer: Self-pay

## 2024-10-08 NOTE — Telephone Encounter (Signed)
 Left message requesting a call back regarding her allergy  injections

## 2024-10-13 ENCOUNTER — Ambulatory Visit: Admitting: Family Medicine

## 2024-10-14 ENCOUNTER — Telehealth: Payer: Self-pay

## 2024-10-14 ENCOUNTER — Ambulatory Visit (HOSPITAL_BASED_OUTPATIENT_CLINIC_OR_DEPARTMENT_OTHER): Payer: Self-pay | Attending: Rheumatology | Admitting: Physical Therapy

## 2024-10-14 ENCOUNTER — Encounter (HOSPITAL_BASED_OUTPATIENT_CLINIC_OR_DEPARTMENT_OTHER): Payer: Self-pay | Admitting: Physical Therapy

## 2024-10-14 DIAGNOSIS — M6281 Muscle weakness (generalized): Secondary | ICD-10-CM | POA: Insufficient documentation

## 2024-10-14 DIAGNOSIS — R2689 Other abnormalities of gait and mobility: Secondary | ICD-10-CM | POA: Diagnosis present

## 2024-10-14 DIAGNOSIS — M5459 Other low back pain: Secondary | ICD-10-CM | POA: Insufficient documentation

## 2024-10-14 NOTE — Telephone Encounter (Signed)
 Received PA request from [facility] in [place] [phone] in regards to patients fexofenadine  HCL 180mg  tablets needs approval key: [id number] please send to plan for approval

## 2024-10-14 NOTE — Therapy (Signed)
 OUTPATIENT PHYSICAL THERAPY THORACOLUMBAR TREATMENT      Patient Name: Rachel [name] MRN: [id number] DOB:07-27-1986, 38 y.o., female Today's Date: 10/14/2024  END OF SESSION:  PT End of Session - 10/14/24 1451     Visit Number 9    Date for Recertification  11/21/24    Authorization Type [facility]    Authorization - Number of Visits 5    PT Start Time 1450    PT Stop Time 1530    PT Time Calculation (min) 40 min    Activity Tolerance Patient tolerated treatment well    Behavior During Therapy [facility] for tasks assessed/performed           Past Medical History:  Diagnosis Date   ADHD (attention deficit hyperactivity disorder)    Anemia    Anxiety    Asthma    Bipolar disorder (HCC)    Complication of anesthesia    slow to wake up    Delayed gastric emptying 02/23/2020   Depression    DUB (dysfunctional uterine bleeding) 02/23/2020   Eczema    Ehlers-Danlos syndrome    Endometriosis    Gastroparesis    GERD (gastroesophageal reflux disease)    Heart murmur    Hiatal hernia 04/06/2020   Myalgia 04/06/2020   Nausea & vomiting 09/22/2022   Parasitic infection 02/23/2020   Pneumonia    Poor appetite 02/23/2020   Recurrent upper respiratory infection (URI)    RUQ pain 02/23/2020   Vitamin D  deficiency    Weight loss 02/23/2020   Past Surgical History:  Procedure Laterality Date   barium study     COLONOSCOPY     CYSTOSCOPY N/A 07/12/2021   Procedure: CYSTOSCOPY;  Surgeon: Zina Jerilynn LABOR, MD;  Location: [facility] OR;  Service: Gynecology;  Laterality: N/A;   ESOPHAGOGASTRODUODENOSCOPY  2011   and a ph study as well.    LAPAROSCOPIC GASTRIC RESECTION N/A 01/22/2023   Procedure: LAPAROSCOPIC GASTRIC PEXY;  Surgeon: Lyndel Deward PARAS, MD;  Location: [facility] OR[facility];  Service: General;  Laterality: N/A;   LAPAROSCOPY N/A 01/26/2021   Procedure: LAPAROSCOPY DIAGNOSTIC;  Surgeon: Zina Jerilynn LABOR, MD;  Location: [facility];  Service: Gynecology;   Laterality: N/A;   NASAL SEPTUM SURGERY     SINOSCOPY     TONGUE FLAP     TOTAL LAPAROSCOPIC HYSTERECTOMY WITH SALPINGECTOMY Bilateral 07/12/2021   Procedure: TOTAL LAPAROSCOPIC HYSTERECTOMY WITH SALPINGECTOMY, OOPHORECTOMY;  Surgeon: Zina Jerilynn LABOR, MD;  Location: [facility] OR;  Service: Gynecology;  Laterality: Bilateral;   UPPER GASTROINTESTINAL ENDOSCOPY     WISDOM TOOTH EXTRACTION     Patient Active Problem List   Diagnosis Date Noted   Ehlers-Danlos syndrome 08/05/2024   Acute stress reaction 06/14/2023   Chronic migraine w/o aura w/o status migrainosus, not intractable 02/23/2023   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   Memory loss 12/25/2022   Paresthesia 12/25/2022   Other allergic rhinitis 12/04/2022   Allergic conjunctivitis of both eyes 12/04/2022   Mild intermittent asthma without complication 12/04/2022   Other atopic dermatitis 12/04/2022   Allergic reaction 12/04/2022   Rash and other nonspecific skin eruption 12/04/2022   Multiple drug allergies 12/04/2022   Other adverse food reactions, not elsewhere classified, subsequent encounter 12/04/2022   Abnormal CT of the abdomen 09/23/2022   Abdominal pain 09/22/2022   Asthma, chronic 09/22/2022   HLD (hyperlipidemia) 09/22/2022   Nausea & vomiting 09/22/2022   Iron  deficiency anemia 01/20/2022   Acute hyponatremia 01/18/2022   Pyelonephritis 01/18/2022  Protein-calorie malnutrition, severe 01/18/2022   sepsis secondary to pyelonephritis of left kidney 01/17/2022   Sepsis (HCC) 01/17/2022   Chronic abdominal pain 01/17/2022   Dyspnea 01/17/2022   Underweight 01/17/2022   Irritable bowel syndrome (IBS) 01/17/2022   Hypokalemia 01/17/2022   Vasomotor symptoms due to menopause 08/17/2021   S/P laparoscopic hysterectomy 07/12/2021   Preoperative exam for gynecologic surgery 06/20/2021   Draining postoperative wound 02/28/2021   Encounter for postoperative care 02/16/2021   Endometriosis determined by laparoscopy    Pelvic  pain in female 10/11/2020   Presence of subdermal contraceptive implant 10/11/2020   Dysmenorrhea 10/11/2020   Dyspareunia in female 10/11/2020   NSVT (nonsustained ventricular tachycardia) (HCC) 09/03/2020   Gastroparesis 08/18/2020   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   Palpitations 07/26/2020   Family history of connective tissue disease 07/26/2020   Myalgia 04/06/2020   Hiatal hernia 04/06/2020   Recurrent infections 02/23/2020   Weight loss 02/23/2020   Poor appetite 02/23/2020   RUQ pain 02/23/2020   Delayed gastric emptying 02/23/2020   DUB (dysfunctional uterine bleeding) 02/23/2020   Cervical radiculopathy 05/31/2018   Hyperalgesia 05/31/2018   Muscle weakness 03/13/2018   GERD (gastroesophageal reflux disease) 12/26/2017   ADHD (attention deficit hyperactivity disorder) 05/07/2012   Bipolar 1 disorder (HCC) 05/07/2012    PCP: Corrina Sabin MD  REFERRING PROVIDER:   Dolphus Reiter, MD    REFERRING DIAG:  M79.10 (ICD-10-CM) - Myalgia  M25.50 (ICD-10-CM) - Hypermobility arthralgia    Rationale for Evaluation and Treatment: Rehabilitation  THERAPY DIAG:  Muscle weakness (generalized)  Other low back pain  Other abnormalities of gait and mobility  ONSET DATE: Chronic  SUBJECTIVE:                                                                                                                                                                                           SUBJECTIVE STATEMENT: Pain has been a consistent 8/10, water  it is a little bit less.  Didn't want to get up.  Initial subjective Last year has been an nightmare. Mds think I have a autoimmune disease. Official dx with EDS and POTS. Dr Joane wants me back in aquatics because land puts me into my bed.  I have bee getting dizzy (due to POTS).  I have been able to come to the gym at times at least 1 time a week (40 minutes treadmill, some machines).  I have not been able to come to the pool at the  times that it is available  PERTINENT HISTORY:  Dr Deveshwar:Water  therapy/PT for hypermobility and myofascial pain syndrome.   Dr Joane: Evaluate and Treat for  hypermobility and polyarthralgia 1-2 times per week for 4-6 weeks.   Decrease pain, increase strength, flexibility, function, and range of motion.  Modalities may include, traction, ionto, phono, stim, and dry needling prn.  Gastroparesis, ADHD; bilpolar; hypermobility; Myofacial pain syndrome; EDS; dysautonomia   PAIN:  Are you having pain? Yes: NPRS scale: current 7/10; worst 9/10; least 6.5 Pain location: generally cervical spine neck shoulders and spine Pain description: Variable:achy, stiff painful sometimes radiating Aggravating factors: movement, upright sitting, standing Relieving factors: lying down; reclines, heating pad  PRECAUTIONS: None  RED FLAGS: None   WEIGHT BEARING RESTRICTIONS: No  FALLS:  Has patient fallen in last 6 months? Unknown  LIVING ENVIRONMENT:  Steps inside and outside   OCCUPATION: disabled  PLOF: Needs assistance with ADLs Variable  PATIENT GOALS: decrease pain, become more active  NEXT MD VISIT: as needed  OBJECTIVE:  Note: Objective measures were completed at Evaluation unless otherwise noted.   PATIENT SURVEYS:  ODI: 38/50= 76% 10/06/24:  38/50  COGNITION: Overall cognitive status: Within functional limits for tasks assessed     SENSATION: generalized hyperalgesia   POSTURE: forward head  PALPATION: TTP throughout   LUMBAR ROM:   Hypermobile  LOWER EXTREMITY ROM:     Hypermobile  LOWER EXTREMITY MMT:    MMT Right eval Left eval R / L 10/06/24  Hip flexion 4 4 4+  Hip extension     Hip abduction     Hip adduction     Hip internal rotation     Hip external rotation     Knee flexion 4 4 4+  Knee extension 4 4 4+  Ankle dorsiflexion     Ankle plantarflexion     Ankle inversion     Ankle eversion      (Blank rows = not tested)   FUNCTIONAL  TESTS:  Timed up and go (TUG): 18.86   4 stage balance:FT x 20s; Unable to complete semi-tandem; tandem; SLS      10/06/24: semi tandem:assistance to gain positon hold x 5s GAIT: Distance walked: 400 ft Assistive device utilized: None Level of assistance: Complete Independence Comments: Gait slowed. Pause upon standing to ensure steadiness  TREATMENT  [facility] Adult PT Treatment:                                                DATE: 10/14/24 Pt seen for aquatic therapy today.  Treatment took place in water  3.5-4.75 ft in depth at the [facility] pool. Temp of water  was 91.  Pt entered/exited the pool via stairs using step through pattern with hand rail.  *Noodle wrapped anteriorly : hip add/abd; flex/ex  - decompression in above position *PPT and hip hiking seated swing like on solid noodle  - squatted rest period *walking forward, back and side stepping in 3.6 4.3-ft with ue support of solid noodle  - seated rest periods *TrA set using full noodle wide stance x 8; staggered stances x 5   - squatted rest period  - rest period *standing ue add/abd RBHB 2 x 5 *suitcase carry using 1/2 noodle->RBHB bilat and unilateral forward and backward  Pt requires the buoyancy and hydrostatic pressure of water  for support, and to offload joints by unweighting joint load by at least 50 % in navel deep water  and by at least 75-80% in chest to neck deep water .  Viscosity of the water  is needed for resistance of strengthening. Water  current perturbations provides challenge to standing balance requiring increased core activation.                                                 PATIENT EDUCATION:  Education details: Discussed eval findings, rehab rationale, aquatic program progression/POC and pools in area. Patient is in agreement  Person educated: Patient Education method: Explanation Education comprehension: verbalized understanding  HOME EXERCISE PROGRAM: From previous episode x 1 yr ago:  Access Code: [facility]   Land based Access Code: [email or web address] URL: [email or web address] Date: 10/06/2024 Prepared by: Frankie Lunna Vogelgesang Not issued   ASSESSMENT:  CLINICAL IMPRESSION: Pt reports she saw rheumatologist last week and may start taking a med for possible Lupus dx. Is in process of educating herself on the med to make a decision. Pain sensitivity high today. May be secondary to change in weather and rain.  Despite pain she is able to tolerate increased foam resistance with core engagement. She requires multiple rest periods throughout. Goals ongoing    Re-cert: Pt has made steady albeit slow progress as to be expected with chronicity of conditions.  She has been consistent with her participation without miss in aquatic PT sessions and putting forth good effort.  She is tolerating longer time in sessions without limitation to pain or fatigue.  Goals addressed 11/17 which support her progression, not added below. ODI completed today although no change, slight increase in strength and balance as in above charts.  Pt declining land based interventions as she believes she will not tolerate.  Will plan to create a HEP to be completed at home in sup for core strengthening as per her request.  She will continue to benefit from skilled aquatic PT with continued progression towards stated goals.    OBJECTIVE IMPAIRMENTS: decreased activity tolerance, decreased balance, decreased endurance, decreased mobility, decreased strength, dizziness, pain, and hypermobility.   ACTIVITY LIMITATIONS: carrying, lifting, bending, sitting, standing, squatting, stairs, and locomotion level  PARTICIPATION LIMITATIONS: meal prep, cleaning, laundry, driving, shopping, community activity, occupation, and yard work  PERSONAL FACTORS: Behavior pattern, Past/current experiences, Time since onset of injury/illness/exacerbation, Transportation, and 1-2 comorbidities: See PmHx are also affecting patient's  functional outcome.   REHAB POTENTIAL: Fair chronicity of condition  CLINICAL DECISION MAKING: Stable/uncomplicated  EVALUATION COMPLEXITY: Low   GOALS: Goals reviewed with patient? Yes  SHORT TERM GOALS: Target date: 10/25  Pt will tolerate full aquatic sessions consistently without increase in pain and with improving function to demonstrate good toleration and effectiveness of intervention.  Baseline: Goal status:Met 09/17/24   2.  Pt will tolerate amb 5 consecutive minutes submerged to demonstrates progression of toleration to activity Baseline:  Goal status: in progress 09/17/24; Met 09/29/24   LONG TERM GOALS: Target date: 11/21/24  Pt to improve on ODI by 13% to demonstrate statistically significant Improvement in function. (MCID 13-15%) Baseline:38/50= 76% Goal status: In progress 10/06/24  2.  Pt will report return to routine exercise regimen Baseline:  Goal status: In progress 09/29/24  3.  Pt will report decrease in pain by at least 50% for improved toleration to activity/quality of life and to demonstrate improved management of pain. Baseline:  Goal status: In progress 09/29/24  4.  Pt will be indep with final HEP's (land and aquatic as appropriate) for continued management of  condition Baseline:  Goal status: INITIAL  5.  Pt will tolerate full 30 minutes of aquatic exercise to demonstrate an improvement in activity toleration Baseline:  Goal status: In progress 09/29/24   PLAN:  PT FREQUENCY: 1-2x/week  PT DURATION: 6 weeks   PLANNED INTERVENTIONS:  97750- Physical Performance Testing, 97110-Therapeutic exercises, 97530- Therapeutic activity, 97112- Neuromuscular re-education, 97535- Self Care, 02859- Manual therapy, [phone]- Gait training, [phone]- Aquatic Therapy, [phone] (1-2 muscles), [phone] (3+ muscles)- Dry Needling, Patient/Family education, Balance training, Stair training, Taping, Joint mobilization, DME instructions, Cryotherapy, and Moist heat.  PLAN  FOR NEXT SESSION: aquatics only: general strengthening; endurance training; pain management   Ronal Foots) Estalee Mccandlish MPT 10/14/24 2:52 PM [facility] [address] [place], [place], [address] Phone: [phone]   Fax:  [phone]  For all possible CPT codes, reference the Planned Interventions line above.     Check all conditions that are expected to impact treatment: {Conditions expected to impact treatment:Musculoskeletal disorders   If treatment provided at initial evaluation, no treatment charged due to lack of authorization.

## 2024-10-15 ENCOUNTER — Telehealth: Payer: Self-pay

## 2024-10-15 ENCOUNTER — Other Ambulatory Visit (HOSPITAL_COMMUNITY): Payer: Self-pay

## 2024-10-15 NOTE — Telephone Encounter (Signed)
 Your request has been approved PA Case: [id number], Status: Approved, Coverage Starts on: 10/15/2024 12:00:00 AM, Coverage Ends on: 10/15/2025 12:00:00 AM. Authorization Expiration12/01/2025

## 2024-10-15 NOTE — Telephone Encounter (Signed)
*  AA  Pharmacy Patient Advocate Encounter   Received notification from Pt Calls Messages that prior authorization for Fexofenadine  HCl 180MG  tablets   is required/requested.   Insurance verification completed.   The patient is insured through [employer].   Per test claim: PA required; PA submitted to above mentioned insurance via Latent Key/confirmation #/EOC [id number] Status is pending

## 2024-10-15 NOTE — Telephone Encounter (Signed)
 Patient informed.

## 2024-10-20 ENCOUNTER — Telehealth: Payer: Self-pay | Admitting: *Deleted

## 2024-10-20 ENCOUNTER — Ambulatory Visit (HOSPITAL_BASED_OUTPATIENT_CLINIC_OR_DEPARTMENT_OTHER): Payer: Self-pay | Admitting: Physical Therapy

## 2024-10-20 NOTE — Telephone Encounter (Signed)
 Patient states she has had time to think it over and speak with her mother and sports med doctor. Patient states she is willing to give it a try while waiting on a second opinion. Please advise.

## 2024-10-21 ENCOUNTER — Ambulatory Visit: Admitting: Clinical

## 2024-10-21 DIAGNOSIS — F909 Attention-deficit hyperactivity disorder, unspecified type: Secondary | ICD-10-CM

## 2024-10-21 DIAGNOSIS — F4310 Post-traumatic stress disorder, unspecified: Secondary | ICD-10-CM

## 2024-10-21 DIAGNOSIS — Z658 Other specified problems related to psychosocial circumstances: Secondary | ICD-10-CM

## 2024-10-21 NOTE — BH Specialist Note (Signed)
 Integrated Behavioral Health via Telemedicine Visit  10/21/2024 Rachel [name] [id number]  Number of Integrated Behavioral Health Clinician visits: Additional Visit  Session Start time: 1045   Session End time: 1200  Total time in minutes: 75  Referring Provider: Jerilynn Buddle, MD Patient/Family location: Home [facility] Provider location: Center for [facility]  All persons participating in visit: [name] and [facility] Rachel [name]   Types of Service: Individual psychotherapy and Telephone visit  I connected with Rachel [name] [name]'s n/a via  Telephone or Video Enabled Telemedicine Application  (Video is Caregility application) and verified that I am speaking with the correct person using two identifiers. Discussed confidentiality: Yes   I discussed the limitations of telemedicine and the availability of in person appointments.  Discussed there is a possibility of technology failure and discussed alternative modes of communication if that failure occurs.  I discussed that engaging in this telemedicine visit, they consent to the provision of behavioral healthcare and the services will be billed under their insurance.  Patient and/or legal guardian expressed understanding and consented to Telemedicine visit: Yes   Presenting Concerns: Patient and/or family reports the following symptoms/concerns: Worry about possible side effects of starting a new medication, especially hair loss; processing health updates and the past trauma of being dismissed, as well as ongoing complicated relationships and barriers to creating artwork.  Duration of problem: Ongoing; Severity of problem: moderate  Patient and/or Family's Strengths/Protective Factors: Social connections, Concrete supports in place (healthy food, safe environments, etc.), and Sense of purpose  Goals Addressed: Patient will:   Reduce symptoms of: anxiety, depression, and stress   Increase knowledge and/or ability of: stress reduction   Demonstrate ability to: Increase motivation to adhere to plan of care  Progress towards Goals: Ongoing  Interventions: Interventions utilized:  Motivational Interviewing and Supportive Reflection Standardized Assessments completed: Not Needed  Patient and/or Family Response: Patient agrees with treatment plan.  Clinical Assessment/Diagnosis  Post traumatic stress disorder (PTSD)  Attention deficit hyperactivity disorder (ADHD), unspecified ADHD type  Psychosocial stressors   Patient may benefit from continued therapeutic intervention.  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 Chair; printer obtained) -Continue taking Vyvanse  and hydroxyzine  as prescribed; begin new medication after discussing with medical provider (consider keeping log of any side effects noticed) Referral(s): Integrated [employer] (In Clinic)  I discussed the assessment and treatment plan with the patient and/or parent/guardian. They were provided an opportunity to ask questions and all were answered. They agreed with the plan and demonstrated an understanding of the instructions.   They were advised to call back or seek an in-person evaluation if the symptoms worsen or if the condition fails to improve as anticipated.  [name], LCSW     07/28/2024    9:13 AM 01/23/2024    9:08 AM 04/23/2023    8:50 AM 11/02/2022    9:23 AM 08/02/2022    2:20 PM  Depression screen PHQ 2/9  Decreased Interest 1 2 0 0 0  Down, Depressed, Hopeless 0 2 1 1  0  PHQ - 2 Score 1 4 1 1  0   Altered sleeping 2 3 2 1  2  Tired, decreased energy 2 3 2 1 2   Change in appetite 1 3 2 1 1   Feeling bad or failure about yourself  0 0 0 0 0  Trouble concentrating 2 2 1  0 1  Moving slowly or fidgety/restless 0 0 0 0 0  Suicidal thoughts 0 0 0 0 0  PHQ-9 Score 8  15  8  4  6       Data saved with a previous flowsheet row definition      01/23/2024    9:10 AM 04/23/2023    8:51 AM 11/02/2022    9:24 AM 08/02/2022    2:20 PM  GAD 7 : Generalized Anxiety Score  Nervous, Anxious, on Edge 2 2 1  0  Control/stop worrying 2 1 0 0  Worry too much - different things 2 0 0 0  Trouble relaxing 0 0 1 1  Restless 0 0 0 0  Easily annoyed or irritable 0 0 0 0  Afraid - awful might happen 3 0 0 0  Total GAD 7 Score 9 3 2  1

## 2024-10-21 NOTE — Telephone Encounter (Signed)
 Advised patient that We did not get a consent at the. She will need an appointment to get a consent and start on Plaquenil. However Dr. Jammie preference would be to get a second opinion as discussed at the last visit.   Patient would like to schedule an appointment to get consent on PLQ. Patient states she is not taking this lightly and wants to get started on the medication.   Patient is scheduled with Waddell Craze, PA-C on 10/22/2024.

## 2024-10-21 NOTE — Progress Notes (Unsigned)
 Office Visit Note  Patient: Rachel [name]             Date of Birth: [birth year]           MRN: [id number]             PCP: Delbert Clam, MD Referring: Delbert Clam, MD Visit Date: 10/22/2024 Occupation: Data Unavailable  Subjective:  Discuss plaquenil use   History of Present Illness: Rachel [name] is a 38 y.o. female ***     Activities of Daily Living:  Patient reports morning stiffness for *** {minute/hour:19697}.   Patient {ACTIONS;DENIES/REPORTS:21021675::Denies} nocturnal pain.  Difficulty dressing/grooming: {ACTIONS;DENIES/REPORTS:21021675::Denies} Difficulty climbing stairs: {ACTIONS;DENIES/REPORTS:21021675::Denies} Difficulty getting out of chair: {ACTIONS;DENIES/REPORTS:21021675::Denies} Difficulty using hands for taps, buttons, cutlery, and/or writing: {ACTIONS;DENIES/REPORTS:21021675::Denies}  No Rheumatology ROS completed.   PMFS History:  Patient Active Problem List   Diagnosis Date Noted  . Ehlers-Danlos syndrome 08/05/2024  . Acute stress reaction 06/14/2023  . Chronic migraine w/o aura w/o status migrainosus, not intractable 02/23/2023  . 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  . Memory loss 12/25/2022  . Paresthesia 12/25/2022  . Other allergic rhinitis 12/04/2022  . Allergic conjunctivitis of both eyes 12/04/2022  . Mild intermittent asthma without complication 12/04/2022  . Other atopic dermatitis 12/04/2022  . Allergic reaction 12/04/2022  . Rash and other nonspecific skin eruption 12/04/2022  . Multiple drug allergies 12/04/2022  . Other adverse food reactions, not elsewhere classified, subsequent encounter 12/04/2022  . Abnormal CT of the abdomen 09/23/2022  . Abdominal pain 09/22/2022  . Asthma, chronic 09/22/2022  . HLD (hyperlipidemia) 09/22/2022  . Nausea & vomiting 09/22/2022  . Iron  deficiency anemia 01/20/2022  . Acute hyponatremia 01/18/2022  . Pyelonephritis 01/18/2022  . Protein-calorie malnutrition,  severe 01/18/2022  . sepsis secondary to pyelonephritis of left kidney 01/17/2022  . Sepsis (HCC) 01/17/2022  . Chronic abdominal pain 01/17/2022  . Dyspnea 01/17/2022  . Underweight 01/17/2022  . Irritable bowel syndrome (IBS) 01/17/2022  . Hypokalemia 01/17/2022  . Vasomotor symptoms due to menopause 08/17/2021  . S/P laparoscopic hysterectomy 07/12/2021  . Preoperative exam for gynecologic surgery 06/20/2021  . Draining postoperative wound 02/28/2021  . Encounter for postoperative care 02/16/2021  . Endometriosis determined by laparoscopy   . Pelvic pain in female 10/11/2020  . Presence of subdermal contraceptive implant 10/11/2020  . Dysmenorrhea 10/11/2020  . Dyspareunia in female 10/11/2020  . NSVT (nonsustained ventricular tachycardia) (HCC) 09/03/2020  . Gastroparesis 08/18/2020  . 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  . Palpitations 07/26/2020  . Family history of connective tissue disease 07/26/2020  . Myalgia 04/06/2020  . Hiatal hernia 04/06/2020  . Recurrent infections 02/23/2020  . Weight loss 02/23/2020  . Poor appetite 02/23/2020  . RUQ pain 02/23/2020  . Delayed gastric emptying 02/23/2020  . DUB (dysfunctional uterine bleeding) 02/23/2020  . Cervical radiculopathy 05/31/2018  . Hyperalgesia 05/31/2018  . Muscle weakness 03/13/2018  . GERD (gastroesophageal reflux disease) 12/26/2017  . ADHD (attention deficit hyperactivity disorder) 05/07/2012  . Bipolar 1 disorder (HCC) 05/07/2012    Past Medical History:  Diagnosis Date  . ADHD (attention deficit hyperactivity disorder)   . Anemia   . Anxiety   . Asthma   . Bipolar disorder (HCC)   . Complication of anesthesia    slow to wake up   . Delayed gastric emptying 02/23/2020  . Depression   . DUB (dysfunctional uterine bleeding) 02/23/2020  . Eczema   . Ehlers-Danlos syndrome   . Endometriosis   . Gastroparesis   . GERD (gastroesophageal reflux disease)   .  Heart murmur   . Hiatal hernia 04/06/2020   . Myalgia 04/06/2020  . Nausea & vomiting 09/22/2022  . Parasitic infection 02/23/2020  . Pneumonia   . Poor appetite 02/23/2020  . Recurrent upper respiratory infection (URI)   . RUQ pain 02/23/2020  . Vitamin D  deficiency   . Weight loss 02/23/2020    Family History  Problem Relation Age of Onset  . Melanoma Mother   . Eczema Mother   . Urticaria Mother   . Allergic rhinitis Mother   . Colon polyps Mother   . Depression Mother   . Anxiety disorder Mother   . Hypertension Mother   . Hyperlipidemia Mother   . Asthma Mother   . Endometriosis Mother   . Fibroids Mother   . Fibromyalgia Mother   . Migraines Mother   . Irritable bowel syndrome Mother   . Allergic rhinitis Father   . Depression Father   . Hypertension Father   . Hyperlipidemia Father   . Bipolar disorder Father   . Melanoma Sister   . Urticaria Sister   . Allergic rhinitis Sister   . Bipolar disorder Sister   . ADD / ADHD Sister   . Asthma Sister   . Endometriosis Sister   . ADD / ADHD Sister   . Seizures Sister   . Anxiety disorder Sister   . Heart disease Maternal Grandmother   . Endometriosis Maternal Grandmother   . Fibroids Maternal Grandmother   . Hyperlipidemia Maternal Grandmother   . Hypertension Maternal Grandmother   . Diabetes Maternal Grandmother   . Fibromyalgia Maternal Grandmother   . Heart disease Maternal Grandfather   . Diabetes Maternal Grandfather   . Heart disease Paternal Grandmother   . Heart disease Paternal Grandfather   . Colon cancer Maternal Great-grandmother   . Pancreatic cancer Neg Hx   . Esophageal cancer Neg Hx   . Stomach cancer Neg Hx    Past Surgical History:  Procedure Laterality Date  . barium study    . COLONOSCOPY    . CYSTOSCOPY N/A 07/12/2021   Procedure: CYSTOSCOPY;  Surgeon: Zina Jerilynn LABOR, MD;  Location: [facility];  Service: Gynecology;  Laterality: N/A;  . ESOPHAGOGASTRODUODENOSCOPY  2011   and a ph study as well.   SABRA LAPAROSCOPIC GASTRIC  RESECTION N/A 01/22/2023   Procedure: LAPAROSCOPIC GASTRIC PEXY;  Surgeon: Lyndel Deward PARAS, MD;  Location: [facility];  Service: General;  Laterality: N/A;  . LAPAROSCOPY N/A 01/26/2021   Procedure: LAPAROSCOPY DIAGNOSTIC;  Surgeon: Zina Jerilynn LABOR, MD;  Location: [facility];  Service: Gynecology;  Laterality: N/A;  . NASAL SEPTUM SURGERY    . SINOSCOPY    . TONGUE FLAP    . TOTAL LAPAROSCOPIC HYSTERECTOMY WITH SALPINGECTOMY Bilateral 07/12/2021   Procedure: TOTAL LAPAROSCOPIC HYSTERECTOMY WITH SALPINGECTOMY, OOPHORECTOMY;  Surgeon: Zina Jerilynn LABOR, MD;  Location: [facility] OR;  Service: Gynecology;  Laterality: Bilateral;  . UPPER GASTROINTESTINAL ENDOSCOPY    . WISDOM TOOTH EXTRACTION     Social History   Tobacco Use  . Smoking status: Never    Passive exposure: Current (minimal)  . Smokeless tobacco: Never  Vaping Use  . Vaping status: Never Used  Substance Use Topics  . Alcohol use: Yes    Alcohol/week: 1.0 standard drink of alcohol    Types: 1 Glasses of wine per week    Comment: rare  . Drug use: No   Social History   Social History Narrative  . Not on file  Immunization History  Administered Date(s) Administered  . Influenza, Seasonal, Injecte, Preservative Fre 07/28/2024  . Influenza,inj,Quad PF,6+ Mos 07/14/2020, 08/02/2022, 08/02/2022  . PFIZER(Purple Top)SARS-COV-2 Vaccination 02/19/2020, 03/17/2020, 09/11/2020  . Pfizer Covid-19 Vaccine Bivalent Booster 68yrs & up 09/15/2021  . Tdap 01/07/2021     Objective: Vital Signs: LMP  (LMP Unknown)    Physical Exam Vitals and nursing note reviewed.  Constitutional:      Appearance: She is well-developed.  HENT:     Head: Normocephalic and atraumatic.  Eyes:     Conjunctiva/sclera: Conjunctivae normal.  Cardiovascular:     Rate and Rhythm: Normal rate and regular rhythm.     Heart sounds: Normal heart sounds.  Pulmonary:     Effort: Pulmonary effort is normal.     Breath sounds: Normal breath  sounds.  Abdominal:     General: Bowel sounds are normal.     Palpations: Abdomen is soft.  Musculoskeletal:     Cervical back: Normal range of motion.  Lymphadenopathy:     Cervical: No cervical adenopathy.  Skin:    General: Skin is warm and dry.     Capillary Refill: Capillary refill takes less than 2 seconds.  Neurological:     Mental Status: She is alert and oriented to person, place, and time.  Psychiatric:        Behavior: Behavior normal.     Musculoskeletal Exam: ***  CDAI Exam: CDAI Score: -- Patient Global: --; Provider Global: -- Swollen: --; Tender: -- Joint Exam 10/22/2024   No joint exam has been documented for this visit   There is currently no information documented on the homunculus. Go to the Rheumatology activity and complete the homunculus joint exam.  Investigation: No additional findings.  Imaging: No results found.  Recent Labs: Lab Results  Component Value Date   WBC 6.2 05/11/2024   HGB 13.2 05/11/2024   PLT 213 05/11/2024   NA 140 05/11/2024   K 3.9 07/28/2024   CL 106 05/11/2024   CO2 25 05/11/2024   GLUCOSE 86 05/11/2024   BUN 12 05/11/2024   CREATININE 0.87 05/11/2024   BILITOT 0.3 05/11/2024   ALKPHOS 55 05/11/2024   AST 23 05/11/2024   ALT 16 05/11/2024   PROT 6.9 05/11/2024   ALBUMIN 4.2 05/11/2024   CALCIUM  9.8 05/11/2024   GFRAA 128 02/04/2020    Speciality Comments: No specialty comments available.  Procedures:  No procedures performed Allergies: Amitriptyline , Amphetamine-dextroamphetamine, Lupron [leuprolide], Orilissa  [elagolix], Penicillins, Wound dressing adhesive, Betadine  [povidone-iodine ], Chamomile, Chlorhexidine , Dandelion, Doxycycline, Mixed ragweed, Olanzapine, Other, Porcine (pork) protein-containing drug products, Prozac [fluoxetine hcl], Stevia glycerite extract [flavoring agent (non-screening)], Tape, Triple antibiotic pain relief [neomy-bacit-polymyx-pramoxine], and Reglan  [metoclopramide ]    Assessment / Plan:     Visit Diagnoses: Positive ANA (antinuclear antibody)  Myalgia  Hyperalgesia  Hypermobility arthralgia  Rash  Mixed hyperlipidemia  Mild intermittent asthma without complication  Irritable bowel syndrome with both constipation and diarrhea  Hiatal hernia  Chronic abdominal pain  Gastroesophageal reflux disease without esophagitis  Delayed gastric emptying  Chronic migraine w/o aura w/o status migrainosus, not intractable  Bipolar 1 disorder (HCC)  Other iron  deficiency anemia  Attention deficit hyperactivity disorder (ADHD), combined type  Other atopic dermatitis  Orders: No orders of the defined types were placed in this encounter.  No orders of the defined types were placed in this encounter.   Face-to-face time spent with patient was *** minutes. Greater than 50% of time was spent in counseling and coordination  of care.  Follow-Up Instructions: No follow-ups on file.   Waddell CHRISTELLA Craze, PA-C  Note - This record has been created using Dragon software.  Chart creation errors have been sought, but may not always  have been located. Such creation errors do not reflect on  the standard of medical care.

## 2024-10-21 NOTE — Telephone Encounter (Signed)
 We did not get a consent at the.  She will need an appointment to get a consent and start on Plaquenil.  However my preference would be to get a second opinion as discussed at the last visit.

## 2024-10-22 ENCOUNTER — Other Ambulatory Visit: Payer: Self-pay | Admitting: Gastroenterology

## 2024-10-22 ENCOUNTER — Encounter: Payer: Self-pay | Admitting: Physician Assistant

## 2024-10-22 ENCOUNTER — Other Ambulatory Visit: Payer: Self-pay

## 2024-10-22 ENCOUNTER — Ambulatory Visit: Attending: Physician Assistant | Admitting: Physician Assistant

## 2024-10-22 VITALS — BP 115/81 | HR 73 | Temp 97.4°F | Resp 14 | Ht 62.0 in | Wt 109.8 lb

## 2024-10-22 DIAGNOSIS — M791 Myalgia, unspecified site: Secondary | ICD-10-CM | POA: Diagnosis present

## 2024-10-22 DIAGNOSIS — M2550 Pain in unspecified joint: Secondary | ICD-10-CM | POA: Insufficient documentation

## 2024-10-22 DIAGNOSIS — G8929 Other chronic pain: Secondary | ICD-10-CM

## 2024-10-22 DIAGNOSIS — M359 Systemic involvement of connective tissue, unspecified: Secondary | ICD-10-CM | POA: Diagnosis present

## 2024-10-22 DIAGNOSIS — L2089 Other atopic dermatitis: Secondary | ICD-10-CM | POA: Insufficient documentation

## 2024-10-22 DIAGNOSIS — F319 Bipolar disorder, unspecified: Secondary | ICD-10-CM | POA: Diagnosis not present

## 2024-10-22 DIAGNOSIS — R208 Other disturbances of skin sensation: Secondary | ICD-10-CM | POA: Diagnosis not present

## 2024-10-22 DIAGNOSIS — K582 Mixed irritable bowel syndrome: Secondary | ICD-10-CM

## 2024-10-22 DIAGNOSIS — K219 Gastro-esophageal reflux disease without esophagitis: Secondary | ICD-10-CM | POA: Diagnosis not present

## 2024-10-22 DIAGNOSIS — Z79899 Other long term (current) drug therapy: Secondary | ICD-10-CM | POA: Diagnosis not present

## 2024-10-22 DIAGNOSIS — E782 Mixed hyperlipidemia: Secondary | ICD-10-CM | POA: Insufficient documentation

## 2024-10-22 DIAGNOSIS — J4520 Mild intermittent asthma, uncomplicated: Secondary | ICD-10-CM | POA: Insufficient documentation

## 2024-10-22 DIAGNOSIS — K449 Diaphragmatic hernia without obstruction or gangrene: Secondary | ICD-10-CM

## 2024-10-22 DIAGNOSIS — R7689 Other specified abnormal immunological findings in serum: Secondary | ICD-10-CM

## 2024-10-22 DIAGNOSIS — D508 Other iron deficiency anemias: Secondary | ICD-10-CM | POA: Diagnosis present

## 2024-10-22 DIAGNOSIS — F902 Attention-deficit hyperactivity disorder, combined type: Secondary | ICD-10-CM | POA: Insufficient documentation

## 2024-10-22 DIAGNOSIS — G43709 Chronic migraine without aura, not intractable, without status migrainosus: Secondary | ICD-10-CM | POA: Insufficient documentation

## 2024-10-22 DIAGNOSIS — R21 Rash and other nonspecific skin eruption: Secondary | ICD-10-CM | POA: Insufficient documentation

## 2024-10-22 DIAGNOSIS — R109 Unspecified abdominal pain: Secondary | ICD-10-CM | POA: Insufficient documentation

## 2024-10-22 DIAGNOSIS — K30 Functional dyspepsia: Secondary | ICD-10-CM

## 2024-10-22 NOTE — Progress Notes (Unsigned)
 Per Waddell Craze, PA-C, place referral for PLQ eye exam.

## 2024-10-22 NOTE — Patient Instructions (Signed)
 Standing Labs We placed an order today for your standing lab work.   Please have your standing labs drawn in 1 month, 3 months, and then every 5 months.  Please have your labs drawn 2 weeks prior to your appointment so that the provider can discuss your lab results at your appointment, if possible.  Please note that you may see your imaging and lab results in MyChart before we have reviewed them. We will contact you once all results are reviewed. Please allow our office up to 72 hours to thoroughly review all of the results before contacting the office for clarification of your results.  WALK-IN LAB HOURS  Monday through Thursday from 8:00 am - 4:30 pm and Friday from 8:00 am-12:00 pm.  Patients with office visits requiring labs will be seen before walk-in labs.  You may encounter longer than normal wait times. Please allow additional time. Wait times may be shorter on  Monday and Thursday afternoons.  We do not book appointments for walk-in labs. We appreciate your patience and understanding with our staff.   Labs are drawn by [facility]. Please bring your co-pay at the time of your lab draw.  You may receive a bill from [facility] for your lab work.  Please note if you are on Hydroxychloroquine and and an order has been placed for a Hydroxychloroquine level,  you will need to have it drawn 4 hours or more after your last dose.  If you wish to have your labs drawn at another location, please call the office 24 hours in advance so we can fax the orders.  The office is located at [address], [place], [place] [address]   If you have any questions regarding directions or hours of operation,  please call [phone].   As a reminder, please drink plenty of water  prior to coming for your lab work. Thanks!    Hydroxychloroquine Tablets What is this medication? HYDROXYCHLOROQUINE (hye drox ee KLOR oh kwin) treats autoimmune conditions, such as rheumatoid arthritis and lupus. It works  by slowing down an overactive immune system. It may also be used to prevent and treat malaria. It works by killing the parasite that causes malaria. It belongs to a group of medications called DMARDs. This medicine may be used for other purposes; ask your health care provider or pharmacist if you have questions. COMMON BRAND NAME(S): Plaquenil, Quineprox, SOVUNA What should I tell my care team before I take this medication? They need to know if you have any of these conditions: Diabetes Eye disease, vision problems Frequently drink alcohol G6PD deficiency Heart disease Irregular heartbeat or rhythm Kidney disease Liver disease Porphyria Psoriasis An unusual or allergic reaction to hydroxychloroquine, other medications, foods, dyes, or preservatives Pregnant or trying to get pregnant Breastfeeding How should I use this medication? Take this medication by mouth with water . Take it as directed on the prescription label. Do not cut, crush, or chew this medication. Swallow the tablets whole. Take it with food. Do not take it more than directed. Take all of this medication unless your care team tells you to stop it early. Keep taking it even if you think you are better. Take products with antacids in them at a different time of day than this medication. Take this medication 4 hours before or 4 hours after antacids. Talk to your care team if you have questions. Talk to your care team about the use of this medication in children. While this medication may be prescribed for  selected conditions, precautions do apply. Overdosage: If you think you have taken too much of this medicine contact a poison control center or emergency room at once. NOTE: This medicine is only for you. Do not share this medicine with others. What if I miss a dose? If you miss a dose, take it as soon as you can. If it is almost time for your next dose, take only that dose. Do not take double or extra doses. What may interact  with this medication? Do not take this medication with any of the following: Cisapride Dronedarone Pimozide Thioridazine This medication may also interact with the following: Ampicillin Antacids Cimetidine Cyclosporine Digoxin Kaolin Medications for diabetes, such as insulin, glipizide, glyburide Medications for seizures, such as carbamazepine, phenobarbital, phenytoin Mefloquine Methotrexate Other medications that cause heart rhythm changes Praziquantel This list may not describe all possible interactions. Give your health care provider a list of all the medicines, herbs, non-prescription drugs, or dietary supplements you use. Also tell them if you smoke, drink alcohol, or use illegal drugs. Some items may interact with your medicine. What should I watch for while using this medication? Visit your care team for regular checks on your progress. Tell your care team if your symptoms do not start to get better or if they get worse. You may need blood work done while you are taking this medication. If you take other medications that can affect heart rhythm, you may need more testing. Talk to your care team if you have questions. Your vision may be tested before and during use of this medication. Tell your care team right away if you have any change in your eyesight. This medication may cause serious skin reactions. They can happen weeks to months after starting the medication. Contact your care team right away if you notice fevers or flu-like symptoms with a rash. The rash may be red or purple and then turn into blisters or peeling of the skin. Or, you might notice a red rash with swelling of the face, lips or lymph nodes in your neck or under your arms. If you or your family notice any changes in your behavior, such as new or worsening depression, thoughts of harming yourself, anxiety, or other unusual or disturbing thoughts, or memory loss, call your care team right away. What side effects  may I notice from receiving this medication? Side effects that you should report to your care team as soon as possible: Allergic reactions--skin rash, itching, hives, swelling of the face, lips, tongue, or throat Aplastic anemia--unusual weakness or fatigue, dizziness, headache, trouble breathing, increased bleeding or bruising Change in vision Heart rhythm changes--fast or irregular heartbeat, dizziness, feeling faint or lightheaded, chest pain, trouble breathing Infection--fever, chills, cough, or sore throat Low blood sugar (hypoglycemia)--tremors or shaking, anxiety, sweating, cold or clammy skin, confusion, dizziness, rapid heartbeat Muscle injury--unusual weakness or fatigue, muscle pain, dark yellow or brown urine, decrease in amount of urine Pain, tingling, or numbness in the hands or feet Rash, fever, and swollen lymph nodes Redness, blistering, peeling, or loosening of the skin, including inside the mouth Thoughts of suicide or self-harm, worsening mood, or feelings of depression Unusual bruising or bleeding Side effects that usually do not require medical attention (report to your care team if they continue or are bothersome): Diarrhea Headache Nausea Stomach pain Vomiting This list may not describe all possible side effects. Call your doctor for medical advice about side effects. You may report side effects to FDA at 1-800-FDA-1088. Where should  I keep my medication? Keep out of the reach of children and pets. Store at room temperature up to 30 degrees C (86 degrees F). Protect from light. Get rid of any unused medication after the expiration date. To get rid of medications that are no longer needed or have expired: Take the medication to a medication take-back program. Check with your pharmacy or law enforcement to find a location. If you cannot return the medication, check the label or package insert to see if the medication should be thrown out in the garbage or flushed down  the toilet. If you are not sure, ask your care team. If it is safe to put it in the trash, empty the medication out of the container. Mix the medication with cat litter, dirt, coffee grounds, or other unwanted substance. Seal the mixture in a bag or container. Put it in the trash. NOTE: This sheet is a summary. It may not cover all possible information. If you have questions about this medicine, talk to your doctor, pharmacist, or health care provider.  2024 Elsevier/Gold Standard (2022-05-08 00:00:00)

## 2024-10-23 ENCOUNTER — Ambulatory Visit: Payer: Self-pay | Admitting: Physician Assistant

## 2024-10-23 DIAGNOSIS — Z79899 Other long term (current) drug therapy: Secondary | ICD-10-CM

## 2024-10-23 LAB — COMPREHENSIVE METABOLIC PANEL WITH GFR
AG Ratio: 1.8 (calc) (ref 1.0–2.5)
ALT: 16 U/L (ref 6–29)
AST: 22 U/L (ref 10–30)
Albumin: 4.9 g/dL (ref 3.6–5.1)
Alkaline phosphatase (APISO): 59 U/L (ref 31–125)
BUN: 15 mg/dL (ref 7–25)
CO2: 32 mmol/L (ref 20–32)
Calcium: 11 mg/dL — ABNORMAL HIGH (ref 8.6–10.2)
Chloride: 101 mmol/L (ref 98–110)
Creat: 0.86 mg/dL (ref 0.50–0.97)
Globulin: 2.8 g/dL (ref 1.9–3.7)
Glucose, Bld: 91 mg/dL (ref 65–99)
Potassium: 4.5 mmol/L (ref 3.5–5.3)
Sodium: 140 mmol/L (ref 135–146)
Total Bilirubin: 0.7 mg/dL (ref 0.2–1.2)
Total Protein: 7.7 g/dL (ref 6.1–8.1)
eGFR: 89 mL/min/1.73m2 (ref >=60)

## 2024-10-23 LAB — CBC WITH DIFFERENTIAL/PLATELET
Absolute Lymphocytes: 2621 {cells}/uL (ref 850–3900)
Absolute Monocytes: 518 {cells}/uL (ref 200–950)
Basophils Absolute: 50 {cells}/uL (ref 0–200)
Basophils Relative: 0.7 %
Eosinophils Absolute: 302 {cells}/uL (ref 15–500)
Eosinophils Relative: 4.2 %
HCT: 45.2 % (ref 35.9–46.0)
Hemoglobin: 14.8 g/dL (ref 11.7–15.5)
MCH: 31.4 pg (ref 27.0–33.0)
MCHC: 32.7 g/dL (ref 31.6–35.4)
MCV: 96 fL (ref 81.4–101.7)
MPV: 11.4 fL (ref 7.5–12.5)
Monocytes Relative: 7.2 %
Neutro Abs: 3708 {cells}/uL (ref 1500–7800)
Neutrophils Relative %: 51.5 %
Platelets: 254 Thousand/uL (ref 140–400)
RBC: 4.71 Million/uL (ref 3.80–5.10)
RDW: 12 % (ref 11.0–15.0)
Total Lymphocyte: 36.4 %
WBC: 7.2 Thousand/uL (ref 3.8–10.8)

## 2024-10-23 MED ORDER — HYDROXYCHLOROQUINE SULFATE 200 MG PO TABS: 200.0000 mg | ORAL_TABLET | Freq: Every day | ORAL | 0 refills | Status: AC

## 2024-10-23 NOTE — Progress Notes (Signed)
 CBC WNL Calcium  is elevated-please clarify if she is taking a calcium  supplement or has increased dietary calcium  intake? Rest of CMP WNL Ok to send in plaquenil

## 2024-10-23 NOTE — Progress Notes (Signed)
 Plaquenil will ease inflammation.    Can try biotin for hair loss.   Autoimmune labs weren't checked yesterday but will be checked in 3 months.   I am uncertain of the cause of the elevation of calcium .  Plan to repeat CBC and CMP in 1 month so we can recheck at that time.  If it remains elevated we can check PTH in the future.

## 2024-10-27 ENCOUNTER — Encounter (HOSPITAL_BASED_OUTPATIENT_CLINIC_OR_DEPARTMENT_OTHER): Payer: Self-pay | Admitting: Physical Therapy

## 2024-10-27 ENCOUNTER — Ambulatory Visit (HOSPITAL_BASED_OUTPATIENT_CLINIC_OR_DEPARTMENT_OTHER): Admitting: Physical Therapy

## 2024-10-27 DIAGNOSIS — R2689 Other abnormalities of gait and mobility: Secondary | ICD-10-CM

## 2024-10-27 DIAGNOSIS — M6281 Muscle weakness (generalized): Secondary | ICD-10-CM

## 2024-10-27 DIAGNOSIS — M5459 Other low back pain: Secondary | ICD-10-CM

## 2024-10-27 NOTE — Therapy (Signed)
 OUTPATIENT PHYSICAL THERAPY THORACOLUMBAR TREATMENT      Patient Name: Rachel [name] MRN: [id number] DOB:01-Aug-1986, 38 y.o., female Today's Date: 10/27/2024  END OF SESSION:  PT End of Session - 10/27/24 0804     Visit Number 10    Date for Recertification  11/21/24    Authorization Type [facility]    Authorization - Visit Number 4    Authorization - Number of Visits 5    PT Start Time 0800    PT Stop Time 0840    PT Time Calculation (min) 40 min    Activity Tolerance Patient tolerated treatment well    Behavior During Therapy [facility] for tasks assessed/performed           Past Medical History:  Diagnosis Date   ADHD (attention deficit hyperactivity disorder)    Anemia    Anxiety    Asthma    Bipolar disorder (HCC)    Complication of anesthesia    slow to wake up    Delayed gastric emptying 02/23/2020   Depression    DUB (dysfunctional uterine bleeding) 02/23/2020   Eczema    Ehlers-Danlos syndrome    Endometriosis    Gastroparesis    GERD (gastroesophageal reflux disease)    Heart murmur    Hiatal hernia 04/06/2020   Myalgia 04/06/2020   Nausea & vomiting 09/22/2022   Parasitic infection 02/23/2020   Pneumonia    Poor appetite 02/23/2020   Recurrent upper respiratory infection (URI)    RUQ pain 02/23/2020   Vitamin D  deficiency    Weight loss 02/23/2020   Past Surgical History:  Procedure Laterality Date   barium study     COLONOSCOPY     CYSTOSCOPY N/A 07/12/2021   Procedure: CYSTOSCOPY;  Surgeon: Zina Jerilynn LABOR, MD;  Location: [facility] OR;  Service: Gynecology;  Laterality: N/A;   ESOPHAGOGASTRODUODENOSCOPY  2011   and a ph study as well.    LAPAROSCOPIC GASTRIC RESECTION N/A 01/22/2023   Procedure: LAPAROSCOPIC GASTRIC PEXY;  Surgeon: Lyndel Deward PARAS, MD;  Location: [facility];  Service: General;  Laterality: N/A;   LAPAROSCOPY N/A 01/26/2021   Procedure: LAPAROSCOPY DIAGNOSTIC;  Surgeon: Zina Jerilynn LABOR, MD;  Location: [facility];  Service: Gynecology;  Laterality: N/A;   NASAL SEPTUM SURGERY     SINOSCOPY     TONGUE FLAP     TOTAL LAPAROSCOPIC HYSTERECTOMY WITH SALPINGECTOMY Bilateral 07/12/2021   Procedure: TOTAL LAPAROSCOPIC HYSTERECTOMY WITH SALPINGECTOMY, OOPHORECTOMY;  Surgeon: Zina Jerilynn LABOR, MD;  Location: [facility];  Service: Gynecology;  Laterality: Bilateral;   UPPER GASTROINTESTINAL ENDOSCOPY     WISDOM TOOTH EXTRACTION     Patient Active Problem List   Diagnosis Date Noted   Ehlers-Danlos syndrome 08/05/2024   Acute stress reaction 06/14/2023   Chronic migraine w/o aura w/o status migrainosus, not intractable 02/23/2023   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   Memory loss 12/25/2022   Paresthesia 12/25/2022   Other allergic rhinitis 12/04/2022   Allergic conjunctivitis of both eyes 12/04/2022   Mild intermittent asthma without complication 12/04/2022   Other atopic dermatitis 12/04/2022   Allergic reaction 12/04/2022   Rash and other nonspecific skin eruption 12/04/2022   Multiple drug allergies 12/04/2022   Other adverse food reactions, not elsewhere classified, subsequent encounter 12/04/2022   Abnormal CT of the abdomen 09/23/2022   Abdominal pain 09/22/2022   Asthma, chronic 09/22/2022   HLD (hyperlipidemia) 09/22/2022   Nausea & vomiting 09/22/2022   Iron  deficiency anemia 01/20/2022  Acute hyponatremia 01/18/2022   Pyelonephritis 01/18/2022   Protein-calorie malnutrition, severe 01/18/2022   sepsis secondary to pyelonephritis of left kidney 01/17/2022   Sepsis (HCC) 01/17/2022   Chronic abdominal pain 01/17/2022   Dyspnea 01/17/2022   Underweight 01/17/2022   Irritable bowel syndrome (IBS) 01/17/2022   Hypokalemia 01/17/2022   Vasomotor symptoms due to menopause 08/17/2021   S/P laparoscopic hysterectomy 07/12/2021   Preoperative exam for gynecologic surgery 06/20/2021   Draining postoperative wound 02/28/2021   Encounter for postoperative care 02/16/2021   Endometriosis  determined by laparoscopy    Pelvic pain in female 10/11/2020   Presence of subdermal contraceptive implant 10/11/2020   Dysmenorrhea 10/11/2020   Dyspareunia in female 10/11/2020   NSVT (nonsustained ventricular tachycardia) (HCC) 09/03/2020   Gastroparesis 08/18/2020   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   Palpitations 07/26/2020   Family history of connective tissue disease 07/26/2020   Myalgia 04/06/2020   Hiatal hernia 04/06/2020   Recurrent infections 02/23/2020   Weight loss 02/23/2020   Poor appetite 02/23/2020   RUQ pain 02/23/2020   Delayed gastric emptying 02/23/2020   DUB (dysfunctional uterine bleeding) 02/23/2020   Cervical radiculopathy 05/31/2018   Hyperalgesia 05/31/2018   Muscle weakness 03/13/2018   GERD (gastroesophageal reflux disease) 12/26/2017   ADHD (attention deficit hyperactivity disorder) 05/07/2012   Bipolar 1 disorder (HCC) 05/07/2012    PCP: Corrina Sabin MD  REFERRING PROVIDER:   Dolphus Reiter, MD    REFERRING DIAG:  M79.10 (ICD-10-CM) - Myalgia  M25.50 (ICD-10-CM) - Hypermobility arthralgia    Rationale for Evaluation and Treatment: Rehabilitation  THERAPY DIAG:  Muscle weakness (generalized)  Other low back pain  Other abnormalities of gait and mobility  ONSET DATE: Chronic  SUBJECTIVE:                                                                                                                                                                                           SUBJECTIVE STATEMENT: Pain has been a consistent 8/10  Initial subjective Last year has been an nightmare. Mds think I have a autoimmune disease. Official dx with EDS and POTS. Dr Joane wants me back in aquatics because land puts me into my bed.  I have bee getting dizzy (due to POTS).  I have been able to come to the gym at times at least 1 time a week (40 minutes treadmill, some machines).  I have not been able to come to the pool at the times that it is  available  PERTINENT HISTORY:  Dr Deveshwar:Water  therapy/PT for hypermobility and myofascial pain syndrome.   Dr Joane: Evaluate and Treat for hypermobility and polyarthralgia 1-2  times per week for 4-6 weeks.   Decrease pain, increase strength, flexibility, function, and range of motion.  Modalities may include, traction, ionto, phono, stim, and dry needling prn.  Gastroparesis, ADHD; bilpolar; hypermobility; Myofacial pain syndrome; EDS; dysautonomia   PAIN:  Are you having pain? Yes: NPRS scale: current 7/10; worst 9/10; least 6.5 Pain location: generally cervical spine neck shoulders and spine Pain description: Variable:achy, stiff painful sometimes radiating Aggravating factors: movement, upright sitting, standing Relieving factors: lying down; reclines, heating pad  PRECAUTIONS: None  RED FLAGS: None   WEIGHT BEARING RESTRICTIONS: No  FALLS:  Has patient fallen in last 6 months? Unknown  LIVING ENVIRONMENT:  Steps inside and outside   OCCUPATION: disabled  PLOF: Needs assistance with ADLs Variable  PATIENT GOALS: decrease pain, become more active  NEXT MD VISIT: as needed  OBJECTIVE:  Note: Objective measures were completed at Evaluation unless otherwise noted.   PATIENT SURVEYS:  ODI: 38/50= 76% 10/06/24:  38/50  COGNITION: Overall cognitive status: Within functional limits for tasks assessed     SENSATION: generalized hyperalgesia   POSTURE: forward head  PALPATION: TTP throughout   LUMBAR ROM:   Hypermobile  LOWER EXTREMITY ROM:     Hypermobile  LOWER EXTREMITY MMT:    MMT Right eval Left eval R / L 10/06/24  Hip flexion 4 4 4+  Hip extension     Hip abduction     Hip adduction     Hip internal rotation     Hip external rotation     Knee flexion 4 4 4+  Knee extension 4 4 4+  Ankle dorsiflexion     Ankle plantarflexion     Ankle inversion     Ankle eversion      (Blank rows = not tested)   FUNCTIONAL TESTS:  Timed up  and go (TUG): 18.86   4 stage balance:FT x 20s; Unable to complete semi-tandem; tandem; SLS      10/06/24: semi tandem:assistance to gain positon hold x 5s GAIT: Distance walked: 400 ft Assistive device utilized: None Level of assistance: Complete Independence Comments: Gait slowed. Pause upon standing to ensure steadiness  TREATMENT  [facility] Adult PT Treatment:                                                DATE: 10/27/24 Pt seen for aquatic therapy today.  Treatment took place in water  3.5-4.75 ft in depth at the [facility] pool. Temp of water  was 91.  Pt entered/exited the pool via stairs using step through pattern with hand rail.   *seated on step: LAQ; hip add/abd *standing horizontal add/abd *bow & arrow with opp side step back; same side *PPT and hip hiking seated swing like on solid noodle  - squatted rest period *Noodle wrapped anteriorly : hip add/abd; flex/ex  - decompression in above position *walking forward, back and side stepping in 3.6 4.3-ft with ue support of solid noodle  - seated rest periods *standing ue add/abd RBHB 2 x 5   Pt requires the buoyancy and hydrostatic pressure of water  for support, and to offload joints by unweighting joint load by at least 50 % in navel deep water  and by at least 75-80% in chest to neck deep water .  Viscosity of the water  is needed for resistance of strengthening. Water  current perturbations provides challenge to standing  balance requiring increased core activation.                                                 PATIENT EDUCATION:  Education details: Discussed eval findings, rehab rationale, aquatic program progression/POC and pools in area. Patient is in agreement  Person educated: Patient Education method: Explanation Education comprehension: verbalized understanding  HOME EXERCISE PROGRAM: From previous episode x 1 yr ago: Access Code: [facility]   Land based Access Code: [email or web address] URL:  [email or web address] Date: 10/06/2024 Prepared by: Frankie Orest Dygert Not issued   ASSESSMENT:  CLINICAL IMPRESSION: Pt with good toleration to entire 40 + min session.  Does require multiple rest periods but no increase in pain or limiting fatigue.  Goals ongoing      Re-cert: Pt has made steady albeit slow progress as to be expected with chronicity of conditions.  She has been consistent with her participation without miss in aquatic PT sessions and putting forth good effort.  She is tolerating longer time in sessions without limitation to pain or fatigue.  Goals addressed 11/17 which support her progression, not added below. ODI completed today although no change, slight increase in strength and balance as in above charts.  Pt declining land based interventions as she believes she will not tolerate.  Will plan to create a HEP to be completed at home in sup for core strengthening as per her request.  She will continue to benefit from skilled aquatic PT with continued progression towards stated goals.    OBJECTIVE IMPAIRMENTS: decreased activity tolerance, decreased balance, decreased endurance, decreased mobility, decreased strength, dizziness, pain, and hypermobility.   ACTIVITY LIMITATIONS: carrying, lifting, bending, sitting, standing, squatting, stairs, and locomotion level  PARTICIPATION LIMITATIONS: meal prep, cleaning, laundry, driving, shopping, community activity, occupation, and yard work  PERSONAL FACTORS: Behavior pattern, Past/current experiences, Time since onset of injury/illness/exacerbation, Transportation, and 1-2 comorbidities: See PmHx are also affecting patient's functional outcome.   REHAB POTENTIAL: Fair chronicity of condition  CLINICAL DECISION MAKING: Stable/uncomplicated  EVALUATION COMPLEXITY: Low   GOALS: Goals reviewed with patient? Yes  SHORT TERM GOALS: Target date: 10/25  Pt will tolerate full aquatic sessions consistently without  increase in pain and with improving function to demonstrate good toleration and effectiveness of intervention.  Baseline: Goal status:Met 09/17/24   2.  Pt will tolerate amb 5 consecutive minutes submerged to demonstrates progression of toleration to activity Baseline:  Goal status: in progress 09/17/24; Met 09/29/24   LONG TERM GOALS: Target date: 11/21/24  Pt to improve on ODI by 13% to demonstrate statistically significant Improvement in function. (MCID 13-15%) Baseline:38/50= 76% Goal status: In progress 10/06/24  2.  Pt will report return to routine exercise regimen Baseline:  Goal status: In progress 09/29/24  3.  Pt will report decrease in pain by at least 50% for improved toleration to activity/quality of life and to demonstrate improved management of pain. Baseline:  Goal status: In progress 09/29/24  4.  Pt will be indep with final HEP's (land and aquatic as appropriate) for continued management of condition Baseline:  Goal status: INITIAL  5.  Pt will tolerate full 30 minutes of aquatic exercise to demonstrate an improvement in activity toleration Baseline:  Goal status: In progress 09/29/24   PLAN:  PT FREQUENCY: 1-2x/week  PT DURATION: 6 weeks   PLANNED INTERVENTIONS:  97750- Physical Performance Testing,  97110-Therapeutic exercises, 97530- Therapeutic activity, [id number]- Neuromuscular re-education, [phone]- Self Care, 02859- Manual therapy, [phone]- Gait training, [phone]- Aquatic Therapy, [phone] (1-2 muscles), [id number] (3+ muscles)- Dry Needling, Patient/Family education, Balance training, Stair training, Taping, Joint mobilization, DME instructions, Cryotherapy, and Moist heat.  PLAN FOR NEXT SESSION: aquatics only: general strengthening; endurance training; pain management   Ronal Foots) Bilal Manzer MPT 10/27/2024 8:16 AM [facility] [address] [place], [place], [address] Phone: [phone]   Fax:   [phone]  For all possible CPT codes, reference the Planned Interventions line above.     Check all conditions that are expected to impact treatment: {Conditions expected to impact treatment:Musculoskeletal disorders   If treatment provided at initial evaluation, no treatment charged due to lack of authorization.

## 2024-10-29 ENCOUNTER — Other Ambulatory Visit (HOSPITAL_COMMUNITY): Payer: Self-pay | Admitting: Family Medicine

## 2024-10-29 ENCOUNTER — Telehealth (HOSPITAL_COMMUNITY): Payer: Self-pay | Admitting: Family Medicine

## 2024-10-29 ENCOUNTER — Ambulatory Visit: Admitting: Family Medicine

## 2024-10-29 ENCOUNTER — Telehealth: Payer: Self-pay

## 2024-10-29 ENCOUNTER — Ambulatory Visit (HOSPITAL_BASED_OUTPATIENT_CLINIC_OR_DEPARTMENT_OTHER): Admitting: Physical Therapy

## 2024-10-29 VITALS — BP 102/60 | HR 79 | Ht 62.0 in

## 2024-10-29 DIAGNOSIS — R112 Nausea with vomiting, unspecified: Secondary | ICD-10-CM | POA: Diagnosis not present

## 2024-10-29 DIAGNOSIS — G90A Postural orthostatic tachycardia syndrome (POTS): Secondary | ICD-10-CM | POA: Insufficient documentation

## 2024-10-29 DIAGNOSIS — K3184 Gastroparesis: Secondary | ICD-10-CM

## 2024-10-29 NOTE — Progress Notes (Signed)
° °  Rachel [name] am a scribe for Dr. Artist Lloyd, MD.  Rachel [name] is a 38 y.o. female who presents to [facility] today for f/u hEDS, ADHD, and POTS. Pt was last seen by Dr. Lloyd on 10/07/24 and was advised to start hydroxychloroquine  as prescribed by rheum. Cont Vyvanse , HEP, and PT, completing 10 visits.   Today, pt reports she is having a POTS flare up today. Vision is blurry. Felt like she was going to faint yesterday in the kitchen. Her parents had to help her to her room.  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   MSK: Antalgic gait    Lab and Radiology Results No results found for this or any previous visit (from the past 72 hours). No results found.     Assessment and Plan: 38 y.o. female with very symptomatic POTS occurring in the setting of Ehlers-Danlos syndrome and gastroparesis following a gastric volvulus.  Her POTS has always been a bit symptomatic and recently has been quite bad.  She is unable to keep up with oral fluids and struggling greatly. We discussed options.  Will work on getting her set up with the infusion center for IV fluids on a standing basis.  Ultimately she may need a port so she can do IV fluids at home.  Additionally she is thought to have a rheumatologic disease and recommended that she start taking the hydroxychloroquine  that she already has been prescribed.  Spent time talking about POTS treatment plan and options in the future.  Check back in 1 month.   PDMP not reviewed this encounter. No orders of the defined types were placed in this encounter.  No orders of the defined types were placed in this  encounter.    Discussed warning signs or symptoms. Please see discharge instructions. Patient expresses understanding.   The above documentation has been reviewed and is accurate and complete Artist Lloyd, M.D. Total encounter time 30 minutes including face-to-face time with the patient and, reviewing past medical record, and charting on the date of service.

## 2024-10-29 NOTE — Addendum Note (Signed)
 Addended by: MARDY LEOTIS RAMAN on: 10/29/2024 11:31 AM   Modules accepted: Orders

## 2024-10-29 NOTE — Telephone Encounter (Signed)
 Patient referred to infusion pharmacy team for ambulatory infusion of IV fluids.  Insurance - [place] [employer] Prepaid Site of care - Site of care: [facility] Dx code - K31.84, G90.A Therapy - Pt will receive NaCl 1 liter weekly, provider's office will notify the infusion center if more frequent dosing is needed.  Infusion appointments - Scheduling team will schedule patient as soon as possible.   Thank you,  Norton Blush, PharmD, [facility] Pharmacist [facility]

## 2024-10-29 NOTE — Telephone Encounter (Signed)
 Order placed

## 2024-10-29 NOTE — Telephone Encounter (Signed)
 Needs to order IV saline solution to [facility].

## 2024-10-29 NOTE — Patient Instructions (Addendum)
 Thank you for coming in today.   We will work on getting you set up for IV infusions through [facility].   See you  back in 1 month.

## 2024-10-31 NOTE — BH Specialist Note (Addendum)
 "   Integrated Behavioral Health via Telemedicine Visit  12/09/2024 [name] [id number]  Number of Integrated Behavioral Health Clinician visits: Additional Visit  Session Start time: 1050   Session End time: 1205  Total time in minutes: 75  Referring Provider: Jerilynn Buddle, MD Patient/Family location: Home [facility] Provider location: Center for [facility]  All persons participating in visit: [name] and [facility] Tajah Noguchi   Types of Service: Individual psychotherapy and Telephone visit  I connected with [name] and/or Lorane Anette Dewolfe's n/a via  Telephone or Video Enabled Telemedicine Application  (Video is Caregility application) and verified that I am speaking with the correct person using two identifiers. Discussed confidentiality: Yes   I discussed the limitations of telemedicine and the availability of in person appointments.  Discussed there is a possibility of technology failure and discussed alternative modes of communication if that failure occurs.  I discussed that engaging in this telemedicine visit, they consent to the provision of behavioral healthcare and the services will be billed under their insurance.  Patient and/or legal guardian expressed understanding and consented to Telemedicine visit: Yes   Presenting Concerns: Patient and/or family reports the following symptoms/concerns: Coping with the aftermath of telling her mother about a traumatic incident involving a family member and processing past questionable behaviors by same family member. Pt is remembering past (inaccurate) diagnoses (ODD, bipolar 1, borderline personality disorder) and how her trauma, combined with ADHD and possibly ASD may have looked similar to the inaccurate diagnoses. Pt is somewhat anxious about possible side effect of new medication, but wants to continue taking for now. Pt is questioning  the intentions of current partner; thinks her past traumas and negative relationships may be increasing her anxious response.  Duration of problem: Ongoing; Severity of problem: moderately severe  Patient and/or Family's Strengths/Protective Factors: Social connections, Concrete supports in place (healthy food, safe environments, etc.), and Sense of purpose  Goals Addressed: Patient will:  Reduce symptoms of: anxiety and depression   Increase knowledge and/or ability of: self-management skills   Demonstrate ability to: Increase healthy adjustment to current life circumstances and Increase motivation to adhere to plan of care  Progress towards Goals: Ongoing  Interventions: Interventions utilized:  Communication Skills and Supportive Reflection Standardized Assessments completed: Not Needed  Patient and/or Family Response: Patient agrees with treatment plan.  Clinical Assessment/Diagnosis  Post traumatic stress disorder (PTSD)  Attention deficit hyperactivity disorder (ADHD), unspecified ADHD type  Psychosocial stressors   Patient may benefit from continued therapeutic intervention.  Plan: Follow up with behavioral health clinician on : Two weeks Behavioral recommendations:  -Continue taking Vyvanse  and hydroxyzine  as prescribed, as well as other prescribed medications  -Consider stepping back and allowing partner time and space to pursue the relationship, while prioritizing time with supportive friend group over the upcoming holiday time (ex. Assume the best of intentions with him for your own peace of mind) -Maintain healthy emotional boundaries with others for increased emotional protection during heightened stress at home Referral(s): Integrated [employer] (In Clinic)  I discussed the assessment and treatment plan with the patient and/or parent/guardian. They were provided an opportunity to ask questions and all were answered. They agreed with the plan and  demonstrated an understanding of the instructions.   They were advised to call back or seek an in-person evaluation if the symptoms worsen or if the condition fails to improve as anticipated.  Yuvia Plant C Ronette Hank, LCSW  07/28/2024    9:13 AM 01/23/2024    9:08 AM 04/23/2023    8:50 AM 11/02/2022    9:23 AM 08/02/2022    2:20 PM  Depression screen PHQ 2/9  Decreased Interest 1 2 0 0 0  Down, Depressed, Hopeless 0 2 1 1  0  PHQ - 2 Score 1 4 1 1  0  Altered sleeping 2 3 2 1 2   Tired, decreased energy 2 3 2 1 2   Change in appetite 1 3 2 1 1   Feeling bad or failure about yourself  0 0 0 0 0  Trouble concentrating 2 2 1  0 1  Moving slowly or fidgety/restless 0 0 0 0 0  Suicidal thoughts 0 0 0 0 0  PHQ-9 Score 8  15  8  4  6       Data saved with a previous flowsheet row definition      01/23/2024    9:10 AM 04/23/2023    8:51 AM 11/02/2022    9:24 AM 08/02/2022    2:20 PM  GAD 7 : Generalized Anxiety Score  Nervous, Anxious, on Edge 2  2  1   0   Control/stop worrying 2  1  0  0   Worry too much - different things 2  0  0  0   Trouble relaxing 0  0  1  1   Restless 0  0  0  0   Easily annoyed or irritable 0  0  0  0   Afraid - awful might happen 3  0  0  0   Total GAD 7 Score 9 3 2 1      Data saved with a previous flowsheet row definition     "

## 2024-11-03 ENCOUNTER — Telehealth: Payer: Self-pay

## 2024-11-03 ENCOUNTER — Telehealth: Payer: Self-pay | Admitting: Family Medicine

## 2024-11-03 NOTE — Telephone Encounter (Signed)
 It is unclear if this is an allergic reaction but she does not have to resume if she does not feel comfortable.  The other medication options are a lot more aggressive and can cause many other adverse effects so we do not typically initiate these as a trial.

## 2024-11-03 NOTE — Telephone Encounter (Signed)
 Patient contacted the office and states she was prescribed hydroxychloroquine . Patient states she tried to take her first dose on Friday. Patient states 6 hours later her face turned red. Patient states her skin began to feel itchy and had a fire  ant feeling of burning. Patient states she wanted to clarify if it was a symptom of the medicine that would go away with time or if it was allergy  related. Patient states she also has mast cell. Patient states she has not taken the medication since Friday and held off over the weekend just in case. Please advise.

## 2024-11-03 NOTE — Telephone Encounter (Signed)
 Hard to know if the Plaquenil  caused the rash.  I would definitely check in with your rheumatologist before restarting.

## 2024-11-03 NOTE — Telephone Encounter (Signed)
 Patient advised It is unclear if this is an allergic reaction but she does not have to resume if she does not feel comfortable.  The other medication options are a lot more aggressive and can cause many other adverse effects so we do not typically initiate these as a trial. Patient verbalized understanding.   Patient states she is going to start the medication again and then contact the office if she continues to have complications.

## 2024-11-03 NOTE — Telephone Encounter (Signed)
 Forwarding to Dr. Denyse Amass to review and advise.

## 2024-11-03 NOTE — Telephone Encounter (Signed)
 This is not a typical side effect of Plaquenil  but it cannot be ruled out as a possible side effect.  Plaquenil  drug reactions typically cause a rash on the torso.

## 2024-11-03 NOTE — Telephone Encounter (Signed)
 Patient advised This is not a typical side effect of Plaquenil  but it cannot be ruled out as a possible side effect.  Plaquenil  drug reactions typically cause a rash on the torso. Patient verbalized understanding.   Patient inquired if she is supposed to be continuing the Plaquenil  or not. Patient inquired if this was a possible allergic reaction, would there be any other options. Please advise.

## 2024-11-03 NOTE — Telephone Encounter (Signed)
 Pt started new med (Plaquenil ) rx's by Rheumatology. Stated she discussed this with Dr. Joane and he had some concerns.  She took her first (and last) dose on Friday, 6 hours later she developed a red face and legs itching like fire  ants. She is also contacting Rheumatology for advise but wanted Dr. Virgilio opinion before she decides to continue.

## 2024-11-04 ENCOUNTER — Ambulatory Visit: Admitting: Clinical

## 2024-11-04 DIAGNOSIS — F4310 Post-traumatic stress disorder, unspecified: Secondary | ICD-10-CM | POA: Diagnosis not present

## 2024-11-04 DIAGNOSIS — Z658 Other specified problems related to psychosocial circumstances: Secondary | ICD-10-CM

## 2024-11-04 DIAGNOSIS — F909 Attention-deficit hyperactivity disorder, unspecified type: Secondary | ICD-10-CM

## 2024-11-05 ENCOUNTER — Other Ambulatory Visit: Payer: Self-pay | Admitting: Gastroenterology

## 2024-11-05 NOTE — Telephone Encounter (Signed)
 Tanda Norton BROCKS, [facility]    10/29/24  2:36 PM Note Patient referred to infusion pharmacy team for ambulatory infusion of IV fluids.   Insurance - [place] [employer] Prepaid Site of care - Site of care: [facility] [place] Dx code - K31.84, G90.A Therapy - Pt will receive NaCl 1 liter weekly, provider's office will notify the infusion center if more frequent dosing is needed.   Infusion appointments - Scheduling team will schedule patient as soon as possible.    Thank you,   Norton Tanda, PharmD, [facility] Pharmacist [facility]

## 2024-11-07 NOTE — Telephone Encounter (Signed)
 FYI Patient had allergic reaction to chemical in bath tub, patient is not allergic to PLQ.

## 2024-11-11 ENCOUNTER — Ambulatory Visit (HOSPITAL_COMMUNITY)
Admission: RE | Admit: 2024-11-11 | Discharge: 2024-11-11 | Disposition: A | Discharge status: Home / Self Care | Source: Ambulatory Visit | Attending: Family Medicine | Admitting: Family Medicine

## 2024-11-11 VITALS — BP 111/76 | HR 56 | Temp 97.9°F | Resp 14

## 2024-11-11 DIAGNOSIS — K3184 Gastroparesis: Secondary | ICD-10-CM | POA: Insufficient documentation

## 2024-11-11 DIAGNOSIS — G90A Postural orthostatic tachycardia syndrome (POTS): Secondary | ICD-10-CM | POA: Insufficient documentation

## 2024-11-11 MED ORDER — SODIUM CHLORIDE 0.9 % IV BOLUS
1000.0000 mL | Freq: Once | INTRAVENOUS | Status: AC
  Administered 2024-11-11: 1000 mL via INTRAVENOUS

## 2024-11-14 NOTE — BH Specialist Note (Unsigned)
 "   Integrated Behavioral Health via Telemedicine Visit  11/19/2024 Rachel [name] [id number]  Number of Integrated Behavioral Health Clinician visits: Additional Visit  Session Start time: 1047   Session End time: 1159  Total time in minutes: 72  Referring Provider: Jerilynn Buddle, MD Patient/Family location: Home [facility] Provider location: Center for [facility] Healthcare at [facility] for Women  All persons participating in visit: [name] and [facility] Nidal Rivet   Types of Service: Individual psychotherapy and Telephone visit  I connected with Rachel [name] n/a via  Telephone or Video Enabled Telemedicine Application  (Video is Caregility application) and verified that I am speaking with the correct person using two identifiers. Discussed confidentiality: Yes   I discussed the limitations of telemedicine and the availability of in person appointments.  Discussed there is a possibility of technology failure and discussed alternative modes of communication if that failure occurs.  I discussed that engaging in this telemedicine visit, they consent to the provision of behavioral healthcare and the services will be billed under their insurance.  Patient and/or legal guardian expressed understanding and consented to Telemedicine visit: Yes   Presenting Concerns: Patient and/or family reports the following symptoms/concerns: Increasing fear over living in the [place] (political conflict; fear of losing rights and healthcare) as well as having a difficult time healing from past traumas while living at home, including hearing mom say add me to the list of people who no longer care about you after recently opening up to mom about trauma.  Duration of problem: Ongoing; Severity of problem: moderately severe  Patient and/or Family's Strengths/Protective Factors: Social connections, Concrete supports in place (healthy  food, safe environments, etc.), and Sense of purpose  Goals Addressed: Patient will:  Reduce symptoms of: anxiety and depression    Demonstrate ability to: Increase motivation to adhere to plan of care  Progress towards Goals: Ongoing  Interventions: Interventions utilized:  Motivational Interviewing and Supportive Reflection Standardized Assessments completed: Not Needed  Patient and/or Family Response: Patient agrees with treatment plan.   Clinical Assessment/Diagnosis  Post traumatic stress disorder (PTSD)  Attention deficit hyperactivity disorder (ADHD), unspecified ADHD type  Psychosocial stressors   Patient may benefit from continued therapeutic intervention.  Plan: Follow up with behavioral health clinician on : Two weeks Behavioral recommendations:  -Continue Vyvanse  and hydroxyzine  as prescribed -Continue self-coping strategies that remain helpful (prioritize time with supportive friend group) -Contact [place] Legal Aid regarding any legal recommendations about filing for disability as a first step; consider discussing need for ride to social security office from friends or other supportive people in life, to not have to rely on parents/family Referral(s): [facility] [employer] (In Clinic)  I discussed the assessment and treatment plan with the patient and/or parent/guardian. They were provided an opportunity to ask questions and all were answered. They agreed with the plan and demonstrated an understanding of the instructions.   They were advised to call back or seek an in-person evaluation if the symptoms worsen or if the condition fails to improve as anticipated.  [name], LCSW     07/28/2024    9:13 AM 01/23/2024    9:08 AM 04/23/2023    8:50 AM 11/02/2022    9:23 AM 08/02/2022    2:20 PM  Depression screen PHQ 2/9  Decreased Interest 1 2 0 0 0  Down, Depressed, Hopeless 0 2 1 1  0  PHQ - 2 Score 1 4 1 1  0  Altered sleeping 2 3 2 1 2    Tired, decreased energy 2 3 2 1 2   Change in appetite 1 3 2 1 1   Feeling bad or failure about yourself  0 0 0 0 0  Trouble concentrating 2 2 1  0 1  Moving slowly or fidgety/restless 0 0 0 0 0  Suicidal thoughts 0 0 0 0 0  PHQ-9 Score 8  15  8  4  6       Data saved with a previous flowsheet row definition      01/23/2024    9:10 AM 04/23/2023    8:51 AM 11/02/2022    9:24 AM 08/02/2022    2:20 PM  GAD 7 : Generalized Anxiety Score  Nervous, Anxious, on Edge 2 2 1  0  Control/stop worrying 2 1 0 0  Worry too much - different things 2 0 0 0  Trouble relaxing 0 0 1 1  Restless 0 0 0 0  Easily annoyed or irritable 0 0 0 0  Afraid - awful might happen 3 0 0 0  Total GAD 7 Score 9 3 2 1      "

## 2024-11-17 ENCOUNTER — Encounter: Admitting: Rheumatology

## 2024-11-18 ENCOUNTER — Ambulatory Visit (INDEPENDENT_AMBULATORY_CARE_PROVIDER_SITE_OTHER): Admitting: Clinical

## 2024-11-18 DIAGNOSIS — F4310 Post-traumatic stress disorder, unspecified: Secondary | ICD-10-CM | POA: Diagnosis not present

## 2024-11-18 DIAGNOSIS — F909 Attention-deficit hyperactivity disorder, unspecified type: Secondary | ICD-10-CM

## 2024-11-18 DIAGNOSIS — Z658 Other specified problems related to psychosocial circumstances: Secondary | ICD-10-CM

## 2024-11-18 NOTE — Patient Instructions (Signed)
Center for [facility] Healthcare at [facility] [place] [place] [address] [phone] (main office) [phone] ([place] office)  [employer] [place] [email or web address] [phone]  [facility] [email or web address]

## 2024-11-20 ENCOUNTER — Encounter (HOSPITAL_COMMUNITY)
Admission: RE | Admit: 2024-11-20 | Discharge: 2024-11-20 | Disposition: A | Discharge status: Home / Self Care | Source: Ambulatory Visit | Attending: Family Medicine | Admitting: Family Medicine

## 2024-11-20 VITALS — BP 111/76 | HR 66 | Temp 97.9°F | Resp 16

## 2024-11-20 DIAGNOSIS — G90A Postural orthostatic tachycardia syndrome (POTS): Secondary | ICD-10-CM | POA: Diagnosis not present

## 2024-11-20 DIAGNOSIS — K3184 Gastroparesis: Secondary | ICD-10-CM | POA: Diagnosis present

## 2024-11-20 MED ORDER — SODIUM CHLORIDE 0.9 % IV BOLUS
1000.0000 mL | Freq: Once | INTRAVENOUS | Status: AC
  Administered 2024-11-20: 1000 mL via INTRAVENOUS

## 2024-11-21 NOTE — BH Specialist Note (Signed)
"     Integrated Behavioral Health via Telemedicine Visit  12/02/2024 Rachel [name] [id number]  Number of Integrated Behavioral Health Clinician visits: Additional Visit  Session Start time: 1047   Session End time: 1159  Total time in minutes: 72  Referring Provider: Jerilynn Buddle, MD Patient/Family location: Home [facility] Provider location: Center for [facility] Healthcare at [facility] for Women  All persons participating in visit: [name] and [facility] Rachel [name] Rachel [name]   Types of Service: Individual psychotherapy and Telephone visit  I connected with Rachel [name] [name]'s n/a via  Telephone or Video Enabled Telemedicine Application  (Video is Caregility application) and verified that I am speaking with the correct person using two identifiers. Discussed confidentiality: Yes   I discussed the limitations of telemedicine and the availability of in person appointments.  Discussed there is a possibility of technology failure and discussed alternative modes of communication if that failure occurs.  I discussed that engaging in this telemedicine visit, they consent to the provision of behavioral healthcare and the services will be billed under their insurance.  Patient and/or legal guardian expressed understanding and consented to Telemedicine visit: Yes   Presenting Concerns: Patient and/or family reports the following symptoms/concerns: Ongoing escalation of anxiety, worry, physical stress reaction, attributed to physical health, political unrest, complicated relationships.  Duration of problem: Ongoing; Severity of problem: moderate  Patient and/or Family's Strengths/Protective Factors: Social connections, Concrete supports in place (healthy food, safe environments, etc.), and Sense of purpose  Goals Addressed: Patient will:  Reduce symptoms of: anxiety and depression   Demonstrate ability to: Increase motivation to  adhere to plan of care  Progress towards Goals: Ongoing  Interventions: Interventions utilized:  Motivational Interviewing and Supportive Reflection Standardized Assessments completed: Not Needed  Patient and/or Family Response: Patient agrees with treatment plan.  Clinical Assessment/Diagnosis  Post traumatic stress disorder (PTSD)  Attention deficit hyperactivity disorder (ADHD), unspecified ADHD type  Psychosocial stressors   Patient may benefit from continued therapeutic intervention.   Plan: Follow up with behavioral health clinician on : Two weeks Behavioral recommendations:  -Continue Vyvanse  and hydroxyzine  as prescribe -Continue self-coping strategies that remain helpful (artwork; time with supportive people in life; maintaining open communication with partner) -Continue considering use of [place] [employer] Aid; help from friends for transportation to apply for disability -Continue plan to celebrate friend birthday this weekend (weather permitting) Referral(s): Integrated [employer] (In Clinic)  I discussed the assessment and treatment plan with the patient and/or parent/guardian. They were provided an opportunity to ask questions and all were answered. They agreed with the plan and demonstrated an understanding of the instructions.   They were advised to call back or seek an in-person evaluation if the symptoms worsen or if the condition fails to improve as anticipated.  Wane Mollett C Anton Cheramie, LCSW "

## 2024-11-25 ENCOUNTER — Telehealth: Payer: Self-pay | Admitting: Physician Assistant

## 2024-11-25 ENCOUNTER — Other Ambulatory Visit: Payer: Self-pay | Admitting: *Deleted

## 2024-11-25 DIAGNOSIS — L659 Nonscarring hair loss, unspecified: Secondary | ICD-10-CM

## 2024-11-25 DIAGNOSIS — Z79899 Other long term (current) drug therapy: Secondary | ICD-10-CM

## 2024-11-25 NOTE — Telephone Encounter (Signed)
 Contacted the patient and patient states she was previously having hair loss but now her hair loss has gotten worse, especially on the sides and top of her head. Patient states she did start taking Biotin but wanted to know if there was anything that could be done about her hair loss. Please advise.   Patient also states that she wanted to let her doctor know that her mom has seen differences and some improvement on the patient's condition. Patient also wanted to give a heads up that her sports medicine doctor wants to give the patient a permanent IV Port.

## 2024-11-25 NOTE — Addendum Note (Signed)
 Addended by: CHERYL WADDELL HERO on: 11/25/2024 04:04 PM   Modules accepted: Orders

## 2024-11-25 NOTE — Telephone Encounter (Signed)
 Patient advised Recommend evaluation by dermatology to discuss options for hair growth. Patient inquired if she could have a new referral to Dermatology because she did not appreciate how she was treated by the previous dermatologist. Will work up referral.   Have not associated with a diagnoses due to no diagnoses listed for hair loss. Please review and sign if okay to place new referral.

## 2024-11-25 NOTE — Telephone Encounter (Signed)
 Patient is requesting call about her medication and side affects that may be happening. Pt stated she's experiencing hair loss. Pt is here currently for labs.

## 2024-11-25 NOTE — Telephone Encounter (Signed)
 Recommend evaluation by dermatology to discuss options for hair growth

## 2024-11-25 NOTE — Addendum Note (Signed)
 Addended by: Grenda Lora P on: 11/25/2024 03:26 PM   Modules accepted: Orders

## 2024-11-26 ENCOUNTER — Telehealth: Payer: Self-pay | Admitting: Physician Assistant

## 2024-11-26 ENCOUNTER — Ambulatory Visit: Payer: Self-pay | Admitting: Physician Assistant

## 2024-11-26 LAB — CBC WITH DIFFERENTIAL/PLATELET
Absolute Lymphocytes: 2372 {cells}/uL (ref 850–3900)
Absolute Monocytes: 458 {cells}/uL (ref 200–950)
Basophils Absolute: 52 {cells}/uL (ref 0–200)
Basophils Relative: 0.9 %
Eosinophils Absolute: 191 {cells}/uL (ref 15–500)
Eosinophils Relative: 3.3 %
HCT: 46.8 % — ABNORMAL HIGH (ref 35.9–46.0)
Hemoglobin: 15.3 g/dL (ref 11.7–15.5)
MCH: 30.9 pg (ref 27.0–33.0)
MCHC: 32.7 g/dL (ref 31.6–35.4)
MCV: 94.5 fL (ref 81.4–101.7)
MPV: 11.9 fL (ref 7.5–12.5)
Monocytes Relative: 7.9 %
Neutro Abs: 2726 {cells}/uL (ref 1500–7800)
Neutrophils Relative %: 47 %
Platelets: 257 Thousand/uL (ref 140–400)
RBC: 4.95 Million/uL (ref 3.80–5.10)
RDW: 12.1 % (ref 11.0–15.0)
Total Lymphocyte: 40.9 %
WBC: 5.8 Thousand/uL (ref 3.8–10.8)

## 2024-11-26 LAB — COMPREHENSIVE METABOLIC PANEL WITH GFR
AG Ratio: 1.7 (calc) (ref 1.0–2.5)
ALT: 15 U/L (ref 6–29)
AST: 23 U/L (ref 10–30)
Albumin: 5.4 g/dL — ABNORMAL HIGH (ref 3.6–5.1)
Alkaline phosphatase (APISO): 58 U/L (ref 31–125)
BUN: 15 mg/dL (ref 7–25)
CO2: 29 mmol/L (ref 20–32)
Calcium: 11.9 mg/dL — ABNORMAL HIGH (ref 8.6–10.2)
Chloride: 103 mmol/L (ref 98–110)
Creat: 0.85 mg/dL (ref 0.50–0.97)
Globulin: 3.1 g/dL (ref 1.9–3.7)
Glucose, Bld: 93 mg/dL (ref 65–99)
Potassium: 5.2 mmol/L (ref 3.5–5.3)
Sodium: 141 mmol/L (ref 135–146)
Total Bilirubin: 0.6 mg/dL (ref 0.2–1.2)
Total Protein: 8.5 g/dL — ABNORMAL HIGH (ref 6.1–8.1)
eGFR: 90 mL/min/1.73m2

## 2024-11-26 NOTE — Telephone Encounter (Signed)
 Dr. Sung office called stating they do not take patients insurance. Referral was declined.

## 2024-11-26 NOTE — Telephone Encounter (Signed)
 Referral changed to [facility]

## 2024-11-26 NOTE — Progress Notes (Signed)
 CBC stable.  Calcium  is elevated-11.9-please add PTH.

## 2024-11-27 ENCOUNTER — Encounter (HOSPITAL_COMMUNITY)
Admission: RE | Admit: 2024-11-27 | Discharge: 2024-11-27 | Disposition: A | Source: Ambulatory Visit | Attending: Family Medicine

## 2024-11-27 VITALS — BP 95/62 | HR 65 | Temp 97.8°F | Resp 16

## 2024-11-27 DIAGNOSIS — G90A Postural orthostatic tachycardia syndrome (POTS): Secondary | ICD-10-CM

## 2024-11-27 DIAGNOSIS — K3184 Gastroparesis: Secondary | ICD-10-CM

## 2024-11-27 MED ORDER — SODIUM CHLORIDE 0.9 % IV BOLUS
1000.0000 mL | Freq: Once | INTRAVENOUS | Status: AC
  Administered 2024-11-27: 1000 mL via INTRAVENOUS

## 2024-11-27 NOTE — Progress Notes (Signed)
 Ok to be drawn at upcoming visit.

## 2024-11-28 ENCOUNTER — Ambulatory Visit: Admitting: Family Medicine

## 2024-11-28 VITALS — BP 104/76 | HR 66 | Ht 62.0 in | Wt 110.0 lb

## 2024-11-28 DIAGNOSIS — G90A Postural orthostatic tachycardia syndrome (POTS): Secondary | ICD-10-CM | POA: Diagnosis not present

## 2024-11-28 DIAGNOSIS — G901 Familial dysautonomia [Riley-Day]: Secondary | ICD-10-CM | POA: Diagnosis not present

## 2024-11-28 DIAGNOSIS — K3184 Gastroparesis: Secondary | ICD-10-CM | POA: Diagnosis not present

## 2024-11-28 DIAGNOSIS — E079 Disorder of thyroid, unspecified: Secondary | ICD-10-CM | POA: Diagnosis not present

## 2024-11-28 DIAGNOSIS — M2550 Pain in unspecified joint: Secondary | ICD-10-CM

## 2024-11-28 LAB — COMPREHENSIVE METABOLIC PANEL WITH GFR
ALT: 17 U/L (ref 3–35)
AST: 24 U/L (ref 5–37)
Albumin: 4.9 g/dL (ref 3.5–5.2)
Alkaline Phosphatase: 56 U/L (ref 39–117)
BUN: 10 mg/dL (ref 6–23)
CO2: 30 meq/L (ref 19–32)
Calcium: 10.5 mg/dL (ref 8.4–10.5)
Chloride: 104 meq/L (ref 96–112)
Creatinine, Ser: 0.78 mg/dL (ref 0.40–1.20)
GFR: 96.79 mL/min
Glucose, Bld: 119 mg/dL — ABNORMAL HIGH (ref 70–99)
Potassium: 4.1 meq/L (ref 3.5–5.1)
Sodium: 139 meq/L (ref 135–145)
Total Bilirubin: 0.4 mg/dL (ref 0.2–1.2)
Total Protein: 8 g/dL (ref 6.0–8.3)

## 2024-11-28 LAB — T3, FREE: T3, Free: 9.3 pg/mL — ABNORMAL HIGH (ref 2.3–4.2)

## 2024-11-28 LAB — TSH: TSH: 2.74 u[IU]/mL (ref 0.35–5.50)

## 2024-11-28 LAB — T4, FREE: Free T4: 3.38 ng/dL — ABNORMAL HIGH (ref 0.60–1.60)

## 2024-11-28 MED ORDER — CROMOLYN SODIUM 100 MG/5ML PO CONC: 50.0000 mg | Freq: Three times a day (TID) | ORAL | 12 refills | Status: AC

## 2024-11-28 MED ORDER — LISDEXAMFETAMINE DIMESYLATE 20 MG PO CAPS: 20.0000 mg | ORAL_CAPSULE | Freq: Every day | ORAL | 0 refills | Status: AC

## 2024-11-28 MED ORDER — MIDODRINE HCL 2.5 MG PO TABS: 2.5000 mg | ORAL_TABLET | Freq: Three times a day (TID) | ORAL | 1 refills | Status: AC

## 2024-11-28 MED ORDER — FAMOTIDINE 20 MG PO TABS: 20.0000 mg | ORAL_TABLET | Freq: Two times a day (BID) | ORAL | 2 refills | Status: AC

## 2024-11-28 MED ORDER — FEXOFENADINE HCL 180 MG PO TABS: 360.0000 mg | ORAL_TABLET | Freq: Two times a day (BID) | ORAL | 2 refills | Status: AC

## 2024-11-28 MED ORDER — MONTELUKAST SODIUM 10 MG PO TABS: 10.0000 mg | ORAL_TABLET | Freq: Every day | ORAL | 3 refills | Status: AC

## 2024-11-28 NOTE — Patient Instructions (Addendum)
 Thank you for coming in today.   Please get labs today before you leave   We will increase the infusions to twice a week  I've ordered a port to be placed.  Medications have been refilled.  Check back in 1 month

## 2024-11-28 NOTE — Progress Notes (Signed)
 "        I, Ileana Collet, PhD, LAT, ATC acting as a scribe for Artist Lloyd, MD.  Rachel [name] is a 39 y.o. female who presents to [facility] today for 23-month f/u hEDS, POTS, and gastroparesis. Pt was last seen by Dr. Lloyd on 10/29/24 and we set up IV fluids at the Infusion Center.  Today pt reports she finds the hydration infusions helpful, but only lasts 2-3 days. She is wondering about getting the port placed and increasing frequency of the IV infusion. She is wondering about having the cromolyn  added to the infusions. She is wearing an Invisible Band and has been really noticing how drastic her HR will vary. She is wondering about her upcoming labs prior to her rheum f/u; calcium , HCT, albumin, and protein are elevated. She also notes significant hair loss.     Pertinent review of systems: No fevers or chills  Relevant historical information: POTS/dysautonomia.  Gastroparesis.  EDS.   Exam:  BP 104/76   Pulse 66   Ht 5' 2 (1.575 m)   Wt 110 lb (49.9 kg)   LMP  (LMP Unknown)   SpO2 98%   BMI 20.12 kg/m  General: Well Developed, well nourished, and in no acute distress.   MSK: Normal gait    Lab and Radiology Results Results for orders placed or performed in visit on 11/25/24 (from the past 72 hours)  Comprehensive metabolic panel with GFR     Status: Abnormal   Collection Time: 11/25/24 11:44 AM  Result Value Ref Range   Glucose, Bld 93 65 - 99 mg/dL    Comment: .            Fasting reference interval .    BUN 15 7 - 25 mg/dL   Creat 9.14 9.49 - 9.02 mg/dL   eGFR 90 > OR = 60 fO/fpw/8.26f7   BUN/Creatinine Ratio SEE NOTE: 6 - 22 (calc)    Comment:    Not Reported: BUN and Creatinine are within    reference range. .    Sodium 141 135 - 146 mmol/L   Potassium 5.2 3.5 - 5.3 mmol/L   Chloride 103 98 - 110 mmol/L   CO2 29 20 - 32 mmol/L   Calcium  11.9 (H) 8.6 - 10.2 mg/dL   Total Protein 8.5 (H) 6.1 - 8.1 g/dL   Albumin  5.4 (H) 3.6 - 5.1 g/dL   Globulin 3.1 1.9 - 3.7 g/dL (calc)   AG Ratio 1.7 1.0 - 2.5 (calc)   Total Bilirubin 0.6 0.2 - 1.2 mg/dL   Alkaline phosphatase (APISO) 58 31 - 125 U/L   AST 23 10 - 30 U/L   ALT 15 6 - 29 U/L  CBC with Differential/Platelet     Status: Abnormal   Collection Time: 11/25/24 11:44 AM  Result Value Ref Range   WBC 5.8 3.8 - 10.8 Thousand/uL   RBC 4.95 3.80 - 5.10 Million/uL   Hemoglobin 15.3 11.7 - 15.5 g/dL   HCT 53.1 (H) 64.0 - 53.9 %   MCV 94.5 81.4 - 101.7 fL   MCH 30.9 27.0 - 33.0 pg   MCHC 32.7 31.6 - 35.4 g/dL   RDW 87.8 88.9 - 84.9 %   Platelets 257 140 - 400 Thousand/uL   MPV 11.9 7.5 - 12.5 fL   Neutro Abs 2,726 1,500 - 7,800 cells/uL   Absolute Lymphocytes 2,372 850 - 3,900 cells/uL   Absolute Monocytes 458 200 -  950 cells/uL   Eosinophils Absolute 191 15 - 500 cells/uL   Basophils Absolute 52 0 - 200 cells/uL   Neutrophils Relative % 47 %   Total Lymphocyte 40.9 %   Monocytes Relative 7.9 %   Eosinophils Relative 3.3 %   Basophils Relative 0.9 %   No results found.     Assessment and Plan: 39 y.o. female with dysautonomia/POTS complicated by gastroparesis.  Unfortunately she is not able to maintain her oral fluid adequately resulting in relative dehydration which exacerbates her dysautonomia and POTS.  This is getting to the point where it is disabling.  IV fluids about once a week have been helpful but we will need to increase the frequency to twice weekly.  Additionally after discussion we will add midodrine  at low-dose and consider fludrocortisone in the future.  For IV fluids long-term plan is to proceed with a port which we will order today through interventional radiology.  Ultimately my hope is she can do IV fluids at home.  Lastly recent labs did show elevated calcium  and elevated albumin.  I do believe this is secondary to dehydration but we will follow this up further with repeating metabolic panel and checking ionized calcium  and  parathyroid hormone TSH and T3-T4.  Chronic medications refilled.  Recheck in about 1 month.   PDMP reviewed during this encounter. Orders Placed This Encounter  Procedures   IR IMAGING GUIDED PORT INSERTION    Standing Status:   Future    Expiration Date:   11/28/2025    Reason for Exam (SYMPTOM  OR DIAGNOSIS REQUIRED):   gatroporesis    Is the patient pregnant?:   No    Preferred Imaging Location?:   [id number]   Comprehensive metabolic panel with GFR    Standing Status:   Future    Number of Occurrences:   1    Expiration Date:   11/28/2025   PTH, intact and calcium     Standing Status:   Future    Number of Occurrences:   1    Expiration Date:   11/28/2025   TSH    Standing Status:   Future    Number of Occurrences:   1    Expiration Date:   11/28/2025   T3, free    Standing Status:   Future    Number of Occurrences:   1    Expiration Date:   11/28/2025   T4, free    Standing Status:   Future    Number of Occurrences:   1    Expiration Date:   11/28/2025   Meds ordered this encounter  Medications   midodrine  (PROAMATINE ) 2.5 MG tablet    Sig: Take 1 tablet (2.5 mg total) by mouth 3 (three) times daily with meals.    Dispense:  90 tablet    Refill:  1   famotidine  (PEPCID ) 20 MG tablet    Sig: Take 1 tablet (20 mg total) by mouth 2 (two) times daily.    Dispense:  60 tablet    Refill:  2   cromolyn  (GASTROCROM ) 100 MG/5ML solution    Sig: Take 2.5 mLs (50 mg total) by mouth 4 (four) times daily -  before meals and at bedtime.    Dispense:  480 mL    Refill:  12   fexofenadine  (ALLEGRA  ALLERGY ) 180 MG tablet    Sig: Take 2 tablets (360 mg total) by mouth in the morning and at bedtime.    Dispense:  120  tablet    Refill:  2   montelukast  (SINGULAIR ) 10 MG tablet    Sig: Take 1 tablet (10 mg total) by mouth at bedtime.    Dispense:  90 tablet    Refill:  3   lisdexamfetamine  (VYVANSE ) 20 MG capsule    Sig: Take 1 capsule (20 mg total) by mouth daily.    Dispense:  30  capsule    Refill:  0     Discussed warning signs or symptoms. Please see discharge instructions. Patient expresses understanding.   The above documentation has been reviewed and is accurate and complete Artist Lloyd, M.D.   "

## 2024-11-28 NOTE — Progress Notes (Signed)
 Added to office note, add PTH.

## 2024-11-29 LAB — PTH, INTACT AND CALCIUM
Calcium: 10.4 mg/dL — ABNORMAL HIGH (ref 8.6–10.2)
PTH: 18 pg/mL (ref 16–77)

## 2024-12-01 ENCOUNTER — Ambulatory Visit: Payer: Self-pay | Admitting: Family Medicine

## 2024-12-01 DIAGNOSIS — E079 Disorder of thyroid, unspecified: Secondary | ICD-10-CM

## 2024-12-01 NOTE — Progress Notes (Signed)
 Calcium  is still a little bit elevated.  Free T4 and free T3 are elevated as well.  I am referring to endocrinology to help.

## 2024-12-02 ENCOUNTER — Ambulatory Visit: Payer: Self-pay

## 2024-12-02 ENCOUNTER — Other Ambulatory Visit: Payer: Self-pay | Admitting: Family Medicine

## 2024-12-02 DIAGNOSIS — F4310 Post-traumatic stress disorder, unspecified: Secondary | ICD-10-CM

## 2024-12-02 DIAGNOSIS — Z658 Other specified problems related to psychosocial circumstances: Secondary | ICD-10-CM

## 2024-12-02 DIAGNOSIS — F909 Attention-deficit hyperactivity disorder, unspecified type: Secondary | ICD-10-CM

## 2024-12-02 NOTE — Telephone Encounter (Signed)
 Pt called, saw Dr. Virgilio comments on her labs but does not know what that means. What effect does the elevated labs have on her and why is she being referred to endo?

## 2024-12-03 ENCOUNTER — Other Ambulatory Visit: Payer: Self-pay | Admitting: Gastroenterology

## 2024-12-03 NOTE — Telephone Encounter (Signed)
 Forwarding to Dr. Joane to review and advise.   Per results note:   Artist [name] Joane, MD 12/01/2024  6:41 AM EST Back to Top    Calcium  is still a little bit elevated.  Free T4 and free T3 are elevated as well.  I am referring to endocrinology to help.

## 2024-12-04 ENCOUNTER — Encounter (HOSPITAL_COMMUNITY)
Admission: RE | Admit: 2024-12-04 | Discharge: 2024-12-04 | Disposition: A | Discharge status: Home / Self Care | Source: Ambulatory Visit | Attending: Family Medicine | Admitting: Family Medicine

## 2024-12-04 ENCOUNTER — Telehealth: Payer: Self-pay | Admitting: Family Medicine

## 2024-12-04 VITALS — BP 104/69 | HR 64 | Temp 97.8°F | Resp 16

## 2024-12-04 DIAGNOSIS — K3184 Gastroparesis: Secondary | ICD-10-CM

## 2024-12-04 DIAGNOSIS — G90A Postural orthostatic tachycardia syndrome (POTS): Secondary | ICD-10-CM

## 2024-12-04 MED ORDER — SODIUM CHLORIDE 0.9 % IV BOLUS
1000.0000 mL | Freq: Once | INTRAVENOUS | Status: AC
  Administered 2024-12-04: 1000 mL via INTRAVENOUS

## 2024-12-04 NOTE — Telephone Encounter (Addendum)
 Patient is on the way to an infusion appointment at the hospital and is concerned about symptoms she has been having. She knows she has been referred to endocrinology and is having a lot of problems getting out of bed and standing and feels very weak. She is having trouble with catching breath and feels pressure at the base of her skull sometimes. She is having a lot of tunnel vision and having trouble swallowing sometimes and sweats a lot. She feels very weak and it has escalated this week. Her mother got on the phone as well and stated she is having more and more trouble each day. She wants Dr. Joane to know that this is progressing. It was so bad yesterday that she could not get out of bed. She will be at the infusion clinic, they are headed there now and if Dr. Joane has any orders he wants done they asked that we call the infusion clinic. Advised if patient is experiencing something super concerning that she go to the ED/UC per [facility]. Please advise.

## 2024-12-04 NOTE — Telephone Encounter (Signed)
 I referred to endocrinology because they are experts at managing hormone abnormalities such as thyroid  hormone abnormalities or parathyroid hormone abnormalities.  Parathyroid hormone regulates calcium .

## 2024-12-04 NOTE — Telephone Encounter (Signed)
 It is hard to tell what is going on.  If this is worsening the emergency room is a place to be.  I do recommend scheduling an appointment with me in the near future.

## 2024-12-04 NOTE — Telephone Encounter (Signed)
 Forwarding to Dr. Denyse Amass to review and advise.
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
## 2024-12-05 NOTE — Telephone Encounter (Signed)
 Called and spoke with patient. She is still not feeling well after infusion yesterday. Notes being able to feel 2-3 swollen lymph nodes in the neck near the thyroid . Energy has been really low. Having trouble sleeping at night. Feel exhausted but jittery. Feels heavy and weak. Requires help ambulating out of bed. Sx have worsened over the past week. May have been running low grade fever this past Wednesday but did not have a thermometer to check. Notes having occasional severe itching at times, mostly on the legs. Denies rash in the legs. A lot of her current sx are new. Notes deep aching pain in the bones. Notes tremors in the hands. Will have jolting pain in the legs at times, mostly at night. Has been sweating more than normal, but this occurs with POTS and s/p hysterectomy. Feels hungrier than normal but still feels unable to eat. Has been having more bouts of diarrhea than normal.   The nurses at the infusion center said that her vitals were stable and going to the ED probably wouldn't be beneficial. Had to be wheeled into the hospital yesterday for infusion.   Her mom's friend's wife has Graves disease and told them that the sx are similar to Grave's Disease. Has concerns about this d/t abnormal rheumatology labs.

## 2024-12-05 NOTE — Telephone Encounter (Signed)
 Called and spoke with patient, advised per Dr. Joane. Pt will call her mom to see if she can take her to the ED. She will keep us  posted via MyChart on how things ago. Scheduled sooner f/u appt with Dr. Joane for 12/10/24.

## 2024-12-08 ENCOUNTER — Telehealth: Payer: Self-pay | Admitting: Family Medicine

## 2024-12-08 ENCOUNTER — Encounter (HOSPITAL_COMMUNITY)

## 2024-12-08 ENCOUNTER — Other Ambulatory Visit: Payer: Self-pay | Admitting: Gastroenterology

## 2024-12-08 NOTE — Telephone Encounter (Signed)
 Request currently in review by patient's Gastroenterologist.

## 2024-12-08 NOTE — Telephone Encounter (Signed)
 Copied from CRM #[id number]. Topic: Clinical - Medication Refill >> Dec 08, 2024  2:00 PM Yolanda T wrote: Medication: LINZESS  290 MCG CAPS capsule  Has the patient contacted their pharmacy? Yes  This is the patient's preferred pharmacy:  [facility] [id number] [place], [place] - [address] [place] [address] Phone: [phone] Fax: [phone]  Is this the correct pharmacy for this prescription? Yes  Has the prescription been filled recently? Yes  Is the patient out of the medication? No  Has the patient been seen for an appointment in the last year OR does the patient have an upcoming appointment? Yes  Can we respond through MyChart? No  Agent: Please be advised that Rx refills may take up to 3 business days. We ask that you follow-up with your pharmacy.

## 2024-12-09 ENCOUNTER — Ambulatory Visit: Admitting: Rheumatology

## 2024-12-09 MED ORDER — LINACLOTIDE 290 MCG PO CAPS: 290.0000 ug | ORAL_CAPSULE | Freq: Every day | ORAL | 3 refills | Status: AC

## 2024-12-09 NOTE — Addendum Note (Signed)
 Addended by: MARDY LEOTIS RAMAN on: 12/09/2024 03:50 PM   Modules accepted: Orders

## 2024-12-09 NOTE — Telephone Encounter (Signed)
 Dr. Joane,  See message from pt below and advise if you'd be willing to take over prescribing Linzess .

## 2024-12-09 NOTE — Progress Notes (Unsigned)
 "  Office Visit Note  Patient: Rachel [name]             Date of Birth: 1986-11-06           MRN: [id number]             PCP: Delbert Clam, MD Referring: Delbert Clam, MD Visit Date: 12/23/2024 Occupation: Data Unavailable  Subjective:  No chief complaint on file.   History of Present Illness: Duru Reiger is a 39 y.o. female ***     Activities of Daily Living:  Patient reports morning stiffness for *** {minute/hour:19697}.   Patient {ACTIONS;DENIES/REPORTS:21021675::Denies} nocturnal pain.  Difficulty dressing/grooming: {ACTIONS;DENIES/REPORTS:21021675::Denies} Difficulty climbing stairs: {ACTIONS;DENIES/REPORTS:21021675::Denies} Difficulty getting out of chair: {ACTIONS;DENIES/REPORTS:21021675::Denies} Difficulty using hands for taps, buttons, cutlery, and/or writing: {ACTIONS;DENIES/REPORTS:21021675::Denies}  No Rheumatology ROS completed.   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   Memory loss 12/25/2022   Paresthesia 12/25/2022   Other allergic rhinitis 12/04/2022   Allergic conjunctivitis of both eyes 12/04/2022   Mild intermittent asthma without complication 12/04/2022   Other atopic dermatitis 12/04/2022   Allergic reaction 12/04/2022   Rash and other nonspecific skin eruption 12/04/2022   Multiple drug allergies 12/04/2022   Other adverse food reactions, not elsewhere classified, subsequent encounter 12/04/2022   Abnormal CT of the abdomen 09/23/2022   Abdominal pain 09/22/2022   Asthma, chronic 09/22/2022   HLD (hyperlipidemia) 09/22/2022   Nausea & vomiting 09/22/2022   Iron  deficiency anemia 01/20/2022   Acute hyponatremia 01/18/2022   Pyelonephritis  01/18/2022   Protein-calorie malnutrition, severe 01/18/2022   sepsis secondary to pyelonephritis of left kidney 01/17/2022   Sepsis (HCC) 01/17/2022   Chronic abdominal pain 01/17/2022   Dyspnea 01/17/2022   Underweight 01/17/2022   Irritable bowel syndrome (IBS) 01/17/2022   Hypokalemia 01/17/2022   Vasomotor symptoms due to menopause 08/17/2021   S/P laparoscopic hysterectomy 07/12/2021   Preoperative exam for gynecologic surgery 06/20/2021   Draining postoperative wound 02/28/2021   Encounter for postoperative care 02/16/2021   Endometriosis determined by laparoscopy    Pelvic pain in female 10/11/2020   Presence of subdermal contraceptive implant 10/11/2020   Dysmenorrhea 10/11/2020   Dyspareunia in female 10/11/2020   NSVT (nonsustained ventricular tachycardia) (HCC) 09/03/2020   Gastroparesis 08/18/2020   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   Palpitations 07/26/2020   Family history of connective tissue disease 07/26/2020   Myalgia 04/06/2020   Hiatal hernia 04/06/2020   Recurrent infections 02/23/2020   Weight loss 02/23/2020   Poor appetite 02/23/2020   RUQ pain 02/23/2020   Delayed gastric emptying 02/23/2020   DUB (dysfunctional uterine bleeding) 02/23/2020   Cervical radiculopathy 05/31/2018   Hyperalgesia 05/31/2018   Muscle weakness 03/13/2018   GERD (gastroesophageal reflux disease) 12/26/2017   ADHD (attention deficit hyperactivity disorder) 05/07/2012   Bipolar 1 disorder (HCC) 05/07/2012    Past Medical History:  Diagnosis Date   ADHD (attention deficit hyperactivity disorder)    Anemia    Anxiety    Asthma    Bipolar disorder (HCC)    Complication of anesthesia    slow to wake up    Delayed gastric emptying 02/23/2020   Depression    DUB (dysfunctional uterine bleeding) 02/23/2020   Eczema    Ehlers-Danlos syndrome    Endometriosis  Gastroparesis    GERD (gastroesophageal reflux disease)    Heart murmur    Hiatal hernia 04/06/2020    Myalgia 04/06/2020   Nausea & vomiting 09/22/2022   Parasitic infection 02/23/2020   Pneumonia    Poor appetite 02/23/2020   Recurrent upper respiratory infection (URI)    RUQ pain 02/23/2020   Vitamin D  deficiency    Weight loss 02/23/2020    Family History  Problem Relation Age of Onset   Melanoma Mother    Eczema Mother    Urticaria Mother    Allergic rhinitis Mother    Colon polyps Mother    Depression Mother    Anxiety disorder Mother    Hypertension Mother    Hyperlipidemia Mother    Asthma Mother    Endometriosis Mother    Fibroids Mother    Fibromyalgia Mother    Migraines Mother    Irritable bowel syndrome Mother    Allergic rhinitis Father    Depression Father    Hypertension Father    Hyperlipidemia Father    Bipolar disorder Father    Melanoma Sister    Urticaria Sister    Allergic rhinitis Sister    Bipolar disorder Sister    ADD / ADHD Sister    Asthma Sister    Endometriosis Sister    ADD / ADHD Sister    Seizures Sister    Anxiety disorder Sister    Heart disease Maternal Grandmother    Endometriosis Maternal Grandmother    Fibroids Maternal Grandmother    Hyperlipidemia Maternal Grandmother    Hypertension Maternal Grandmother    Diabetes Maternal Grandmother    Fibromyalgia Maternal Grandmother    Heart disease Maternal Grandfather    Diabetes Maternal Grandfather    Heart disease Paternal Grandmother    Heart disease Paternal Grandfather    Colon cancer Maternal Great-grandmother    Pancreatic cancer Neg Hx    Esophageal cancer Neg Hx    Stomach cancer Neg Hx    Past Surgical History:  Procedure Laterality Date   barium study     COLONOSCOPY     CYSTOSCOPY N/A 07/12/2021   Procedure: CYSTOSCOPY;  Surgeon: Zina Jerilynn LABOR, MD;  Location: [facility] OR;  Service: Gynecology;  Laterality: N/A;   ESOPHAGOGASTRODUODENOSCOPY  2011   and a ph study as well.    LAPAROSCOPIC GASTRIC RESECTION N/A 01/22/2023   Procedure: LAPAROSCOPIC GASTRIC PEXY;   Surgeon: Lyndel Deward PARAS, MD;  Location: [facility];  Service: General;  Laterality: N/A;   LAPAROSCOPY N/A 01/26/2021   Procedure: LAPAROSCOPY DIAGNOSTIC;  Surgeon: Zina Jerilynn LABOR, MD;  Location:  SURGERY CENTER;  Service: Gynecology;  Laterality: N/A;   NASAL SEPTUM SURGERY     SINOSCOPY     TONGUE FLAP     TOTAL LAPAROSCOPIC HYSTERECTOMY WITH SALPINGECTOMY Bilateral 07/12/2021   Procedure: TOTAL LAPAROSCOPIC HYSTERECTOMY WITH SALPINGECTOMY, OOPHORECTOMY;  Surgeon: Zina Jerilynn LABOR, MD;  Location: [facility] OR;  Service: Gynecology;  Laterality: Bilateral;   UPPER GASTROINTESTINAL ENDOSCOPY     WISDOM TOOTH EXTRACTION     Social History[1] Social History   Social History Narrative   Not on file     Immunization History  Administered Date(s) Administered   Influenza, Seasonal, Injecte, Preservative Fre 07/28/2024   Influenza,inj,Quad PF,6+ Mos 07/14/2020, 08/02/2022, 08/02/2022   PFIZER(Purple Top)SARS-COV-2 Vaccination 02/19/2020, 03/17/2020, 09/11/2020   Pfizer Covid-19 Vaccine Bivalent Booster 40yrs & up 09/15/2021   Tdap 01/07/2021     Objective: Vital Signs: LMP  (LMP Unknown)  Physical Exam   Musculoskeletal Exam: ***  CDAI Exam: CDAI Score: -- Patient Global: --; Provider Global: -- Swollen: --; Tender: -- Joint Exam 12/23/2024   No joint exam has been documented for this visit   There is currently no information documented on the homunculus. Go to the Rheumatology activity and complete the homunculus joint exam.  Investigation: No additional findings.  Imaging: No results found.  Recent Labs: Lab Results  Component Value Date   WBC 5.8 11/25/2024   HGB 15.3 11/25/2024   PLT 257 11/25/2024   NA 139 11/28/2024   K 4.1 11/28/2024   CL 104 11/28/2024   CO2 30 11/28/2024   GLUCOSE 119 (H) 11/28/2024   BUN 10 11/28/2024   CREATININE 0.78 11/28/2024   BILITOT 0.4 11/28/2024   ALKPHOS 56 11/28/2024   AST 24 11/28/2024   ALT 17 11/28/2024    PROT 8.0 11/28/2024   ALBUMIN 4.9 11/28/2024   CALCIUM  10.4 (H) 11/28/2024   CALCIUM  10.5 11/28/2024   GFRAA 128 02/04/2020    Speciality Comments: No specialty comments available.  Procedures:  No procedures performed Allergies: Amitriptyline , Amphetamine-dextroamphetamine, Lupron [leuprolide], Orilissa  [elagolix], Penicillins, Wound dressing adhesive, Betadine  [povidone-iodine ], Chamomile, Chlorhexidine , Dandelion, Doxycycline, Mixed ragweed, Olanzapine, Other, Porcine (pork) protein-containing drug products, Prozac [fluoxetine hcl], Stevia glycerite extract [flavoring agent (non-screening)], Tape, Triple antibiotic pain relief [neomy-bacit-polymyx-pramoxine], and Reglan  [metoclopramide ]   Assessment / Plan:     Visit Diagnoses: Undifferentiated connective tissue disease  High risk medication use  Positive ANA (antinuclear antibody)  Myalgia  Hair loss  Hyperalgesia  Hypermobility arthralgia  Mixed hyperlipidemia  Mild intermittent asthma without complication  Irritable bowel syndrome with both constipation and diarrhea  Hiatal hernia  Gastroesophageal reflux disease without esophagitis  Delayed gastric emptying  Chronic migraine w/o aura w/o status migrainosus, not intractable  Bipolar 1 disorder (HCC)  Other iron  deficiency anemia  Attention deficit hyperactivity disorder (ADHD), combined type  Other atopic dermatitis  Other allergic rhinitis  Multiple drug allergies  Recurrent infections  History of sepsis  Status post hysterectomy  Orders: No orders of the defined types were placed in this encounter.  No orders of the defined types were placed in this encounter.   Face-to-face time spent with patient was *** minutes. Greater than 50% of time was spent in counseling and coordination of care.  Follow-Up Instructions: No follow-ups on file.   Waddell CHRISTELLA Craze, PA-C  Note - This record has been created using Dragon software.  Chart creation  errors have been sought, but may not always  have been located. Such creation errors do not reflect on  the standard of medical care.     [1]  Social History Tobacco Use   Smoking status: Never    Passive exposure: Current (minimal)   Smokeless tobacco: Never  Vaping Use   Vaping status: Never Used  Substance Use Topics   Alcohol use: Yes    Alcohol/week: 1.0 standard drink of alcohol    Types: 1 Glasses of wine per week    Comment: rare   Drug use: No   "

## 2024-12-09 NOTE — Progress Notes (Unsigned)
"       ° °  LILLETTE Ileana Collet, PhD, LAT, ATC acting as a scribe for Artist Lloyd, MD.  Lorane Massa Alarid is a 39 y.o. female who presents to [facility] today for worsening sx related to her hEDS, POTS, and gastroparesis. Pt was last seen by Dr. Lloyd on 11/28/24 and her IV fluid order was increased to twice a wk and a port placement ordered. Also prescribed midodrine  and labs obtained.  Today, pt reports ***  Pertinent review of systems: ***  Relevant historical information: ***   Exam:  LMP  (LMP Unknown)  General: Well Developed, well nourished, and in no acute distress.   MSK: ***    Lab and Radiology Results No results found for this or any previous visit (from the past 72 hours). No results found.     Assessment and Plan: 39 y.o. female with ***   PDMP not reviewed this encounter. No orders of the defined types were placed in this encounter.  No orders of the defined types were placed in this encounter.    Discussed warning signs or symptoms. Please see discharge instructions. Patient expresses understanding.   ***  "

## 2024-12-10 ENCOUNTER — Ambulatory Visit: Payer: Self-pay | Admitting: Family Medicine

## 2024-12-10 ENCOUNTER — Ambulatory Visit: Admitting: Family Medicine

## 2024-12-10 ENCOUNTER — Encounter: Payer: Self-pay | Admitting: Family Medicine

## 2024-12-10 VITALS — BP 102/82 | HR 77 | Ht 62.0 in

## 2024-12-10 DIAGNOSIS — E0590 Thyrotoxicosis, unspecified without thyrotoxic crisis or storm: Secondary | ICD-10-CM

## 2024-12-10 DIAGNOSIS — R590 Localized enlarged lymph nodes: Secondary | ICD-10-CM

## 2024-12-10 DIAGNOSIS — G90A Postural orthostatic tachycardia syndrome (POTS): Secondary | ICD-10-CM

## 2024-12-10 DIAGNOSIS — K3184 Gastroparesis: Secondary | ICD-10-CM

## 2024-12-10 DIAGNOSIS — R5383 Other fatigue: Secondary | ICD-10-CM | POA: Diagnosis not present

## 2024-12-10 LAB — CBC WITH DIFFERENTIAL/PLATELET
Basophils Absolute: 0 10*3/uL (ref 0.0–0.1)
Basophils Relative: 0.7 % (ref 0.0–3.0)
Eosinophils Absolute: 0.2 10*3/uL (ref 0.0–0.7)
Eosinophils Relative: 3 % (ref 0.0–5.0)
HCT: 45.3 % (ref 36.0–46.0)
Hemoglobin: 15.5 g/dL — ABNORMAL HIGH (ref 12.0–15.0)
Lymphocytes Relative: 26.7 % (ref 12.0–46.0)
Lymphs Abs: 1.6 10*3/uL (ref 0.7–4.0)
MCHC: 34.2 g/dL (ref 30.0–36.0)
MCV: 93.3 fl (ref 78.0–100.0)
Monocytes Absolute: 0.5 10*3/uL (ref 0.1–1.0)
Monocytes Relative: 8.3 % (ref 3.0–12.0)
Neutro Abs: 3.6 10*3/uL (ref 1.4–7.7)
Neutrophils Relative %: 61.3 % (ref 43.0–77.0)
Platelets: 227 10*3/uL (ref 150.0–400.0)
RBC: 4.86 Mil/uL (ref 3.87–5.11)
RDW: 12.5 % (ref 11.5–15.5)
WBC: 5.9 10*3/uL (ref 4.0–10.5)

## 2024-12-10 LAB — COMPREHENSIVE METABOLIC PANEL WITH GFR
ALT: 16 U/L (ref 3–35)
AST: 25 U/L (ref 5–37)
Albumin: 5.1 g/dL (ref 3.5–5.2)
Alkaline Phosphatase: 62 U/L (ref 39–117)
BUN: 18 mg/dL (ref 6–23)
CO2: 29 meq/L (ref 19–32)
Calcium: 11.1 mg/dL — ABNORMAL HIGH (ref 8.4–10.5)
Chloride: 101 meq/L (ref 96–112)
Creatinine, Ser: 0.93 mg/dL (ref 0.40–1.20)
GFR: 78.35 mL/min
Glucose, Bld: 97 mg/dL (ref 70–99)
Potassium: 4 meq/L (ref 3.5–5.1)
Sodium: 138 meq/L (ref 135–145)
Total Bilirubin: 0.5 mg/dL (ref 0.2–1.2)
Total Protein: 8.4 g/dL — ABNORMAL HIGH (ref 6.0–8.3)

## 2024-12-10 LAB — MAGNESIUM: Magnesium: 2.3 mg/dL (ref 1.5–2.5)

## 2024-12-10 LAB — SEDIMENTATION RATE: Sed Rate: 12 mm/h (ref 0–20)

## 2024-12-10 MED ORDER — AMBULATORY NON FORMULARY MEDICATION: 0 refills | Status: AC

## 2024-12-10 NOTE — Progress Notes (Signed)
 Calcium  is still elevated.  Potassium is normal as is magnesium.  Sedimentation rate and marker of inflammation is normal. No elevated white blood cell count which would indicate infection.  The mildly elevated hemoglobin probably indicates dehydration. Other labs are still pending

## 2024-12-10 NOTE — Telephone Encounter (Signed)
 Checked CoverMyMeds, prior auth not required for Linzess .

## 2024-12-10 NOTE — Patient Instructions (Addendum)
 Thank you for coming in today.   Stop by lab before you leave.   [facility] (P) [phone]  [facility] for port (P) [phone]  Handicap Parking Placard provided today.   Order provided for lightweight manual wheelchair.   See you back in 2 weeks.

## 2024-12-11 ENCOUNTER — Encounter (HOSPITAL_COMMUNITY)
Admission: RE | Admit: 2024-12-11 | Discharge: 2024-12-11 | Disposition: A | Discharge status: Home / Self Care | Source: Ambulatory Visit | Attending: Family Medicine | Admitting: Family Medicine

## 2024-12-11 VITALS — BP 107/72 | HR 73 | Temp 97.9°F | Resp 18

## 2024-12-11 DIAGNOSIS — G90A Postural orthostatic tachycardia syndrome (POTS): Secondary | ICD-10-CM

## 2024-12-11 DIAGNOSIS — K3184 Gastroparesis: Secondary | ICD-10-CM

## 2024-12-11 LAB — THYROID PEROXIDASE ANTIBODIES (TPO) (REFL): Thyroperoxidase Ab SerPl-aCnc: 2 [IU]/mL

## 2024-12-11 MED ORDER — SODIUM CHLORIDE 0.9 % IV BOLUS
1000.0000 mL | Freq: Once | INTRAVENOUS | Status: AC
  Administered 2024-12-11: 1000 mL via INTRAVENOUS
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
## 2024-12-12 NOTE — Progress Notes (Signed)
 One of the thyroid  tests is normal, others are still pending.

## 2024-12-15 ENCOUNTER — Encounter (HOSPITAL_COMMUNITY)

## 2024-12-15 ENCOUNTER — Inpatient Hospital Stay (HOSPITAL_COMMUNITY): Admission: RE | Admit: 2024-12-15 | Source: Ambulatory Visit

## 2024-12-16 ENCOUNTER — Telehealth: Payer: Self-pay | Admitting: Family Medicine

## 2024-12-16 ENCOUNTER — Ambulatory Visit: Payer: Self-pay | Admitting: Clinical

## 2024-12-16 DIAGNOSIS — F909 Attention-deficit hyperactivity disorder, unspecified type: Secondary | ICD-10-CM

## 2024-12-16 DIAGNOSIS — Z658 Other specified problems related to psychosocial circumstances: Secondary | ICD-10-CM

## 2024-12-16 DIAGNOSIS — F4310 Post-traumatic stress disorder, unspecified: Secondary | ICD-10-CM

## 2024-12-16 LAB — THYROID STIMULATING IMMUNOGLOBULIN

## 2024-12-16 LAB — CALCIUM, IONIZED: Calcium, Ion: 5.8 mg/dL — ABNORMAL HIGH (ref 4.7–5.5)

## 2024-12-16 NOTE — Telephone Encounter (Signed)
 Patient stated that endocrinology can see her in march but they have her on a list in case anything comes sooner. She did not know if this might be the soonest appt for her as this was a cancellation appt that they got her in for.  Please advise.

## 2024-12-17 ENCOUNTER — Ambulatory Visit
Admission: RE | Admit: 2024-12-17 | Discharge: 2024-12-17 | Disposition: A | Source: Ambulatory Visit | Attending: Family Medicine | Admitting: Family Medicine

## 2024-12-17 ENCOUNTER — Other Ambulatory Visit

## 2024-12-17 DIAGNOSIS — R5383 Other fatigue: Secondary | ICD-10-CM

## 2024-12-17 DIAGNOSIS — E0590 Thyrotoxicosis, unspecified without thyrotoxic crisis or storm: Secondary | ICD-10-CM

## 2024-12-18 ENCOUNTER — Inpatient Hospital Stay (HOSPITAL_COMMUNITY)
Admission: RE | Admit: 2024-12-18 | Discharge: 2024-12-18 | Disposition: A | Source: Ambulatory Visit | Attending: Family Medicine

## 2024-12-18 ENCOUNTER — Other Ambulatory Visit: Payer: Self-pay | Admitting: Radiology

## 2024-12-18 VITALS — BP 109/76 | HR 61 | Temp 97.5°F | Resp 16

## 2024-12-18 DIAGNOSIS — G90A Postural orthostatic tachycardia syndrome (POTS): Secondary | ICD-10-CM

## 2024-12-18 DIAGNOSIS — K3184 Gastroparesis: Secondary | ICD-10-CM

## 2024-12-18 MED ORDER — ANTICOAGULANT SODIUM CITRATE 4% (200MG/5ML) IV SOLN
5.0000 mL | Freq: Once | Status: DC | PRN
  Filled 2024-12-18: qty 5

## 2024-12-18 MED ORDER — SODIUM CHLORIDE 0.9% FLUSH: 10.0000 mL | Freq: Once | INTRAVENOUS | Status: DC | PRN

## 2024-12-18 MED ORDER — ALTEPLASE 2 MG IJ SOLR: 2.0000 mg | Freq: Once | INTRAMUSCULAR | Status: DC | PRN

## 2024-12-18 MED ORDER — SODIUM CHLORIDE 0.9 % IV BOLUS
1000.0000 mL | Freq: Once | INTRAVENOUS | Status: AC
  Administered 2024-12-18: 1000 mL via INTRAVENOUS

## 2024-12-18 MED ORDER — SODIUM CHLORIDE 0.9% FLUSH: 3.0000 mL | Freq: Once | INTRAVENOUS | Status: DC | PRN

## 2024-12-18 NOTE — Progress Notes (Signed)
 Thyroid ultrasound looks [facility]

## 2024-12-19 ENCOUNTER — Encounter (HOSPITAL_COMMUNITY): Payer: Self-pay

## 2024-12-19 ENCOUNTER — Other Ambulatory Visit: Payer: Self-pay

## 2024-12-19 ENCOUNTER — Inpatient Hospital Stay (HOSPITAL_COMMUNITY): Admission: RE | Admit: 2024-12-19

## 2024-12-19 DIAGNOSIS — K3184 Gastroparesis: Secondary | ICD-10-CM

## 2024-12-19 LAB — THYROID STIMULATING IMMUNOGLOBULIN: TSI: <89 %{baseline}

## 2024-12-19 MED ORDER — FENTANYL CITRATE (PF) 100 MCG/2ML IJ SOLN
INTRAMUSCULAR | Status: AC
  Filled 2024-12-19: qty 2

## 2024-12-19 MED ORDER — SODIUM CHLORIDE 0.9 % IV BOLUS
INTRAVENOUS | Status: AC | PRN
  Administered 2024-12-19: 1000 mL via INTRAVENOUS

## 2024-12-19 MED ORDER — ONDANSETRON HCL 4 MG/2ML IJ SOLN
INTRAMUSCULAR | Status: AC
  Filled 2024-12-19: qty 2

## 2024-12-19 MED ORDER — MIDAZOLAM HCL 2 MG/2ML IJ SOLN
INTRAMUSCULAR | Status: AC
  Filled 2024-12-19: qty 2

## 2024-12-19 MED ORDER — FENTANYL CITRATE (PF) 100 MCG/2ML IJ SOLN
INTRAMUSCULAR | Status: AC | PRN
  Administered 2024-12-19: 25 ug via INTRAVENOUS
  Administered 2024-12-19: 50 ug via INTRAVENOUS

## 2024-12-19 MED ORDER — MIDAZOLAM HCL (PF) 2 MG/2ML IJ SOLN
INTRAMUSCULAR | Status: AC | PRN
  Administered 2024-12-19: 1 mg via INTRAVENOUS
  Administered 2024-12-19: .5 mg via INTRAVENOUS

## 2024-12-19 MED ORDER — SODIUM CHLORIDE 0.9 % IV SOLN: INTRAVENOUS | Status: AC

## 2024-12-19 MED ORDER — LIDOCAINE-EPINEPHRINE 1 %-1:100000 IJ SOLN
INTRAMUSCULAR | Status: AC
  Filled 2024-12-19: qty 1

## 2024-12-19 NOTE — H&P (Signed)
 "     Chief Complaint: Patient was seen in consultation today for stable long term IV access; Port a cath placement at the request of Corey,Evan S  Referring Physician(s): Corey,Evan S  Supervising Physician: Karalee Beat  Patient Status: [facility] - Out-pt  History of Present Illness: Muslima [name] is a 39 y.o. female   FULL Code status per pt  Known POTS; Ehlers-Danlos; gastroparesis Long term IV access need Multiple sticks per week Becoming more difficult  Scheduled for Port a cath placement in IR   Past Medical History:  Diagnosis Date   ADHD (attention deficit hyperactivity disorder)    Anemia    Anxiety    Asthma    Bipolar disorder (HCC)    Complication of anesthesia    slow to wake up    Delayed gastric emptying 02/23/2020   Depression    DUB (dysfunctional uterine bleeding) 02/23/2020   Eczema    Ehlers-Danlos syndrome    Endometriosis    Gastroparesis    GERD (gastroesophageal reflux disease)    Heart murmur    Hiatal hernia 04/06/2020   Myalgia 04/06/2020   Nausea & vomiting 09/22/2022   Parasitic infection 02/23/2020   Pneumonia    Poor appetite 02/23/2020   Recurrent upper respiratory infection (URI)    RUQ pain 02/23/2020   Vitamin D  deficiency    Weight loss 02/23/2020    Past Surgical History:  Procedure Laterality Date   barium study     COLONOSCOPY     CYSTOSCOPY N/A 07/12/2021   Procedure: CYSTOSCOPY;  Surgeon: Zina Jerilynn LABOR, MD;  Location: [facility] OR;  Service: Gynecology;  Laterality: N/A;   ESOPHAGOGASTRODUODENOSCOPY  2011   and a ph study as well.    LAPAROSCOPIC GASTRIC RESECTION N/A 01/22/2023   Procedure: LAPAROSCOPIC GASTRIC PEXY;  Surgeon: Lyndel Deward PARAS, MD;  Location: [facility] ORS;  Service: General;  Laterality: N/A;   LAPAROSCOPY N/A 01/26/2021   Procedure: LAPAROSCOPY DIAGNOSTIC;  Surgeon: Zina Jerilynn LABOR, MD;  Location: [facility];  Service: Gynecology;  Laterality: N/A;   NASAL SEPTUM SURGERY      SINOSCOPY     TONGUE FLAP     TOTAL LAPAROSCOPIC HYSTERECTOMY WITH SALPINGECTOMY Bilateral 07/12/2021   Procedure: TOTAL LAPAROSCOPIC HYSTERECTOMY WITH SALPINGECTOMY, OOPHORECTOMY;  Surgeon: Zina Jerilynn LABOR, MD;  Location: [facility] OR;  Service: Gynecology;  Laterality: Bilateral;   UPPER GASTROINTESTINAL ENDOSCOPY     WISDOM TOOTH EXTRACTION      Allergies: Amitriptyline , Amphetamine-dextroamphetamine, Lupron [leuprolide], Orilissa  [elagolix], Penicillins, Wound dressing adhesive, Betadine  [povidone-iodine ], Chamomile, Chlorhexidine , Dandelion, Doxycycline, Mixed ragweed, Olanzapine, Other, Porcine (pork) protein-containing drug products, Prozac [fluoxetine hcl], Stevia glycerite extract [flavoring agent (non-screening)], Tape, Triple antibiotic pain relief [neomy-bacit-polymyx-pramoxine], and Reglan  [metoclopramide ]  Medications: Prior to Admission medications  Medication Sig Start Date End Date Taking? Authorizing Provider  atorvastatin  (LIPITOR) 20 MG tablet TAKE 1 TABLET BY MOUTH DAILY 08/15/24  Yes Newlin, Enobong, MD  Collagen-Vitamin C-Biotin (COLLAGEN PO) Take by mouth.   Yes [provider]  cromolyn  (GASTROCROM ) 100 MG/5ML solution Take 2.5 mLs (50 mg total) by mouth 4 (four) times daily -  before meals and at bedtime. 11/28/24  Yes Joane Artist RAMAN, MD  cromolyn  (OPTICROM ) 4 % ophthalmic solution Place 1 drop into both eyes 4 (four) times daily as needed (itchy/watery eyes). 03/26/24  Yes Luke Orlan HERO, DO  famotidine  (PEPCID ) 20 MG tablet Take 1 tablet (20 mg total) by mouth 2 (two) times daily. 11/28/24  Yes Joane Artist RAMAN, MD  FEROSUL 325 (65 Fe) MG tablet TAKE 1 TABLET BY MOUTH DAILY 08/15/24  Yes Newlin, Enobong, MD  fexofenadine  (ALLEGRA  ALLERGY ) 180 MG tablet Take 2 tablets (360 mg total) by mouth in the morning and at bedtime. 11/28/24  Yes Corey, Evan S, MD  hydrocortisone cream 1 % Apply 1 Application topically 2 (two) times daily.   Yes [provider]   hydroxychloroquine  (PLAQUENIL ) 200 MG tablet Take 1 tablet (200 mg total) by mouth daily. 10/23/24  Yes Cheryl Waddell HERO, PA-C  hydrOXYzine  (ATARAX ) 10 MG tablet Take 1 tablet (10 mg total) by mouth 3 (three) times daily as needed for itching. 04/10/24  Yes Luke Orlan HERO, DO  linaclotide  (LINZESS ) 290 MCG CAPS capsule Take 1 capsule (290 mcg total) by mouth daily before breakfast. 12/09/24  Yes Corey, Evan S, MD  lisdexamfetamine  (VYVANSE ) 20 MG capsule Take 1 capsule (20 mg total) by mouth daily. 11/28/24  Yes Corey, Evan S, MD  Melatonin 10 MG TABS Take 10 mg by mouth at bedtime.   Yes [provider]  midodrine  (PROAMATINE ) 2.5 MG tablet Take 1 tablet (2.5 mg total) by mouth 3 (three) times daily with meals. 11/28/24  Yes Joane Artist RAMAN, MD  MIRALAX  17 GM/SCOOP powder Take 17-34 g by mouth daily. Mix 17 g of powder into water  and drink once a day   Yes [provider]  montelukast  (SINGULAIR ) 10 MG tablet Take 1 tablet (10 mg total) by mouth at bedtime. 11/28/24  Yes Corey, Evan S, MD  PREMARIN  0.625 MG tablet TAKE 1 TABLET BY MOUTH DAILY FOR 21 DAYS. THEN DO NOT TAKE FOR 7 DAYS 03/24/24  Yes Zina Jerilynn LABOR, MD  Prenatal Vit-Fe Fumarate-FA (MULTIVITAMIN-PRENATAL) 27-0.8 MG TABS tablet Take 1 tablet by mouth daily at 12 noon.   Yes [provider]  promethazine  (PHENERGAN ) 12.5 MG tablet Take 1 tablet (12.5 mg total) by mouth every 8 (eight) hours as needed for nausea. 09/26/23  Yes Newlin, Enobong, MD  rizatriptan  (MAXALT -MLT) 10 MG disintegrating tablet May repeat in 2 hours if needed 02/23/23  Yes Onita Duos, MD  sennosides-docusate sodium  (SENOKOT-S) 8.6-50 MG tablet Take 2-3 tablets by mouth daily as needed for constipation. Patient taking differently: Take 4 tablets by mouth daily.   Yes [provider]  triamcinolone  ointment (KENALOG ) 0.1 % Apply 1 Application topically 2 (two) times daily as needed (rash flare). Do not use on the face, neck, armpits or groin area.  Do not use more than 3 weeks in a row. 04/10/24  Yes Luke Orlan HERO, DO  albuterol  (PROVENTIL ) (2.5 MG/3ML) 0.083% nebulizer solution Take 3 mLs (2.5 mg total) by nebulization every 4 (four) hours as needed for wheezing or shortness of breath (coughing fits). 06/25/24   Luke Orlan HERO, DO  albuterol  (VENTOLIN  HFA) 108 (90 Base) MCG/ACT inhaler Inhale 2 puffs into the lungs every 4 (four) hours as needed for wheezing or shortness of breath (coughing fits). Patient not taking: No sig reported 03/26/24   Luke Orlan HERO, DO  albuterol  (VENTOLIN  HFA) 108 (90 Base) MCG/ACT inhaler Inhale 2 puffs into the lungs every 4 (four) hours as needed for wheezing or shortness of breath (coughing fits). 06/25/24   Luke Orlan HERO, DO  AMBULATORY NON FORMULARY MEDICATION Light weight manual wheelchair Dispense 1 Dx code: G90.A POTS Use as needed 12/10/24   Corey, Evan S, MD  budesonide -formoterol  (SYMBICORT ) 80-4.5 MCG/ACT inhaler Inhale 2 puffs into the lungs in the morning and at bedtime. Rinse mouth  after each use. Use for 1-2 weeks during asthma flares. 03/26/24   Luke Orlan HERO, DO  Calcium -Magnesium-Vitamin D  600-40-500 MG-MG-UNIT TB24 Take 1 tablet by mouth every evening.    [provider]  EPINEPHRINE  0.3 mg/0.3 mL IJ SOAJ injection INJECT INTO THE MIDDLE OF THE OUTER THIGH AND HOLD FOR 3 SECONDS AS NEEDED FOR SEVERE ALLERGIC REACTION THEN CALL 911 IF USED. 03/14/24   Tobie Arleta SQUIBB, MD     Family History  Problem Relation Age of Onset   Melanoma Mother    Eczema Mother    Urticaria Mother    Allergic rhinitis Mother    Colon polyps Mother    Depression Mother    Anxiety disorder Mother    Hypertension Mother    Hyperlipidemia Mother    Asthma Mother    Endometriosis Mother    Fibroids Mother    Fibromyalgia Mother    Migraines Mother    Irritable bowel syndrome Mother    Allergic rhinitis Father    Depression Father    Hypertension Father    Hyperlipidemia Father    Bipolar disorder Father    Melanoma  Sister    Urticaria Sister    Allergic rhinitis Sister    Bipolar disorder Sister    ADD / ADHD Sister    Asthma Sister    Endometriosis Sister    ADD / ADHD Sister    Seizures Sister    Anxiety disorder Sister    Heart disease Maternal Grandmother    Endometriosis Maternal Grandmother    Fibroids Maternal Grandmother    Hyperlipidemia Maternal Grandmother    Hypertension Maternal Grandmother    Diabetes Maternal Grandmother    Fibromyalgia Maternal Grandmother    Heart disease Maternal Grandfather    Diabetes Maternal Grandfather    Heart disease Paternal Grandmother    Heart disease Paternal Grandfather    Colon cancer Maternal Great-grandmother    Pancreatic cancer Neg Hx    Esophageal cancer Neg Hx    Stomach cancer Neg Hx     Social History   Socioeconomic History   Marital status: Divorced    Spouse name: Not on file   Number of children: Not on file   Years of education: Not on file   Highest education level: Not on file  Occupational History   Not on file  Tobacco Use   Smoking status: Never    Passive exposure: Current (minimal)   Smokeless tobacco: Never  Vaping Use   Vaping status: Never Used  Substance and Sexual Activity   Alcohol use: Yes    Alcohol/week: 1.0 standard drink of alcohol    Types: 1 Glasses of wine per week    Comment: rare   Drug use: No   Sexual activity: Not Currently  Other Topics Concern   Not on file  Social History Narrative   Not on file   Social Drivers of Health   Tobacco Use: Medium Risk (12/19/2024)   Patient History    Smoking Tobacco Use: Never    Smokeless Tobacco Use: Never    Passive Exposure: Current  Financial Resource Strain: Not on file  Food Insecurity: No Food Insecurity (01/22/2023)   Hunger Vital Sign    Worried About Running Out of Food in the Last Year: Never true    Ran Out of Food in the Last Year: Never true  Transportation Needs: No Transportation Needs (01/22/2023)   PRAPARE - Therapist, Art (Medical):  No    Lack of Transportation (Non-Medical): No  Physical Activity: Not on file  Stress: Not on file  Social Connections: Not on file  Depression (PHQ2-9): Medium Risk (07/28/2024)   Depression (PHQ2-9)    PHQ-2 Score: 8  Alcohol Screen: Not on file  Housing: Low Risk (01/22/2023)   Housing    Last Housing Risk Score: 0  Utilities: Not At Risk (01/22/2023)   AHC Utilities    Threatened with loss of utilities: No  Health Literacy: Not on file    Review of Systems: A 12 point ROS discussed and pertinent positives are indicated in the HPI above.  All other systems are negative.  Review of Systems  Constitutional:  Positive for fatigue. Negative for activity change.  Respiratory:  Negative for cough and shortness of breath.   Cardiovascular:  Negative for chest pain.  Gastrointestinal:  Positive for nausea. Negative for abdominal pain.  Neurological:  Positive for weakness.  Psychiatric/Behavioral:  Negative for behavioral problems and confusion.     Vital Signs: BP 118/84   Pulse 69   Temp 98 F (36.7 C) (Oral)   Resp 18   Ht 5' 1 (1.549 m)   Wt 103 lb (46.7 kg)   LMP  (LMP Unknown)   SpO2 99%   BMI 19.46 kg/m     Physical Exam Vitals reviewed.  HENT:     Mouth/Throat:     Mouth: Mucous membranes are moist.  Cardiovascular:     Rate and Rhythm: Normal rate and regular rhythm.     Heart sounds: Normal heart sounds. No murmur heard. Pulmonary:     Effort: Pulmonary effort is normal.     Breath sounds: Normal breath sounds. No wheezing.  Abdominal:     General: Abdomen is flat.     Palpations: Abdomen is soft.     Tenderness: There is no abdominal tenderness.  Musculoskeletal:        General: Normal range of motion.  Skin:    General: Skin is warm and dry.  Neurological:     Mental Status: She is alert and oriented to person, place, and time.  Psychiatric:        Behavior: Behavior normal.     Imaging: US  THYROID  Result  Date: 12/17/2024 CLINICAL DATA:  left lymphadenopathy and elevated thyroid  labs, hyperthyroidism EXAM: THYROID  ULTRASOUND TECHNIQUE: Ultrasound examination of the thyroid  gland and adjacent soft tissues was performed. COMPARISON:  05/11/2024 CT neck with contrast FINDINGS: Parenchymal Echotexture: Normal Isthmus: 2 mm Right lobe: 4.3 x 0.9 x 1.3 cm Left lobe: 3.4 x 0.7 x 1 3 cm _________________________________________________________ Estimated total number of nodules >/= 1 cm: 0 Number of spongiform nodules >/=  2 cm not described below (TR1): 0 Number of mixed cystic and solid nodules >/= 1.5 cm not described below (TR2): 0 _________________________________________________________ No discrete nodules are seen within the thyroid  gland. No hypervascularity. No regional adenopathy. IMPRESSION: Normal thyroid  ultrasound for age. Electronically Signed   By: CHRISTELLA.  Shick M.D.   On: 12/17/2024 15:29    Labs:  CBC: Recent Labs    05/11/24 1320 10/22/24 1002 11/25/24 1144 12/10/24 0850  WBC 6.2 7.2 5.8 5.9  HGB 13.2 14.8 15.3 15.5*  HCT 38.1 45.2 46.8* 45.3  PLT 213 254 257 227.0    COAGS: No results for input(s): INR, APTT in the last 8760 hours.  BMP: Recent Labs    05/11/24 1320 07/28/24 0916 10/22/24 1002 11/25/24 1144 11/28/24 0953 12/10/24 0850  NA 140  --  140 141 139 138  K 3.3*   < > 4.5 5.2 4.1 4.0  CL 106  --  101 103 104 101  CO2 25  --  32 29 30 29   GLUCOSE 86  --  91 93 119* 97  BUN 12  --  15 15 10 18   CALCIUM  9.8  --  11.0* 11.9* 10.4*  10.5 11.1*  CREATININE 0.87  --  0.86 0.85 0.78 0.93  GFRNONAA >60  --   --   --   --   --    < > = values in this interval not displayed.    LIVER FUNCTION TESTS: Recent Labs    03/26/24 0950 03/26/24 0950 05/11/24 1320 10/22/24 1002 11/25/24 1144 11/28/24 0953 12/10/24 0850  BILITOT 0.2   < > 0.3 0.7 0.6 0.4 0.5  AST 24  --  23 22 23 24 25   ALT 19  --  16 16 15 17 16   ALKPHOS 62  --  55  --   --  56 62  PROT 7.0   <  > 6.9 7.7 8.5* 8.0 8.4*  ALBUMIN 4.5  --  4.2  --   --  4.9 5.1   < > = values in this interval not displayed.    TUMOR MARKERS: No results for input(s): AFPTM, CEA, CA199, CHROMGRNA in the last 8760 hours.  Assessment and Plan:  Scheduled for Port a cath placement for long term access Risks and benefits of image guided port-a-catheter placement was discussed with the patient including, but not limited to bleeding, infection, pneumothorax, or fibrin sheath development and need for additional procedures.  All of the patient's questions were answered, patient is agreeable to proceed. Consent signed and in chart.  Thank you for this interesting consult.  I greatly enjoyed meeting Lauriana Denes and look forward to participating in their care.  A copy of this report was sent to the requesting provider on this date.  Electronically Signed: Sharlet DELENA Candle, PA-C 12/19/2024, 11:07 AM   I spent a total of  30 Minutes   in face to face in clinical consultation, greater than 50% of which was counseling/coordinating care for [facility] placement "

## 2024-12-19 NOTE — Procedures (Signed)
 Interventional Radiology Procedure Note  Procedure: Placement of a right IJ approach single lumen Bard Clearvue PowerPort.  Tip is positioned at the superior cavoatrial junction and catheter is ready for immediate use.   Complications: No immediate  Recommendations:  - Ok to shower tomorrow - Do not submerge for 7 days - Routine line care    Signed,  Wilkie LOIS Lent, MD

## 2024-12-22 ENCOUNTER — Encounter (HOSPITAL_COMMUNITY)

## 2024-12-23 ENCOUNTER — Ambulatory Visit: Admitting: Physician Assistant

## 2024-12-23 DIAGNOSIS — L2089 Other atopic dermatitis: Secondary | ICD-10-CM

## 2024-12-23 DIAGNOSIS — G43709 Chronic migraine without aura, not intractable, without status migrainosus: Secondary | ICD-10-CM

## 2024-12-23 DIAGNOSIS — Z79899 Other long term (current) drug therapy: Secondary | ICD-10-CM

## 2024-12-23 DIAGNOSIS — K30 Functional dyspepsia: Secondary | ICD-10-CM

## 2024-12-23 DIAGNOSIS — K219 Gastro-esophageal reflux disease without esophagitis: Secondary | ICD-10-CM

## 2024-12-23 DIAGNOSIS — F319 Bipolar disorder, unspecified: Secondary | ICD-10-CM

## 2024-12-23 DIAGNOSIS — B999 Unspecified infectious disease: Secondary | ICD-10-CM

## 2024-12-23 DIAGNOSIS — K582 Mixed irritable bowel syndrome: Secondary | ICD-10-CM

## 2024-12-23 DIAGNOSIS — M791 Myalgia, unspecified site: Secondary | ICD-10-CM

## 2024-12-23 DIAGNOSIS — D508 Other iron deficiency anemias: Secondary | ICD-10-CM

## 2024-12-23 DIAGNOSIS — J4520 Mild intermittent asthma, uncomplicated: Secondary | ICD-10-CM

## 2024-12-23 DIAGNOSIS — J3089 Other allergic rhinitis: Secondary | ICD-10-CM

## 2024-12-23 DIAGNOSIS — Z8619 Personal history of other infectious and parasitic diseases: Secondary | ICD-10-CM

## 2024-12-23 DIAGNOSIS — R208 Other disturbances of skin sensation: Secondary | ICD-10-CM

## 2024-12-23 DIAGNOSIS — E782 Mixed hyperlipidemia: Secondary | ICD-10-CM

## 2024-12-23 DIAGNOSIS — M359 Systemic involvement of connective tissue, unspecified: Secondary | ICD-10-CM

## 2024-12-23 DIAGNOSIS — R7689 Other specified abnormal immunological findings in serum: Secondary | ICD-10-CM

## 2024-12-23 DIAGNOSIS — Z889 Allergy status to unspecified drugs, medicaments and biological substances status: Secondary | ICD-10-CM

## 2024-12-23 DIAGNOSIS — K449 Diaphragmatic hernia without obstruction or gangrene: Secondary | ICD-10-CM

## 2024-12-23 DIAGNOSIS — L659 Nonscarring hair loss, unspecified: Secondary | ICD-10-CM

## 2024-12-23 DIAGNOSIS — Z90710 Acquired absence of both cervix and uterus: Secondary | ICD-10-CM

## 2024-12-23 DIAGNOSIS — M2550 Pain in unspecified joint: Secondary | ICD-10-CM

## 2024-12-23 DIAGNOSIS — F902 Attention-deficit hyperactivity disorder, combined type: Secondary | ICD-10-CM

## 2024-12-24 ENCOUNTER — Ambulatory Visit: Admitting: Family Medicine

## 2024-12-25 ENCOUNTER — Encounter (HOSPITAL_COMMUNITY)

## 2024-12-30 ENCOUNTER — Ambulatory Visit: Admitting: Family Medicine

## 2025-01-01 ENCOUNTER — Encounter (HOSPITAL_COMMUNITY)

## 2025-01-05 ENCOUNTER — Encounter (HOSPITAL_COMMUNITY)

## 2025-01-08 ENCOUNTER — Encounter (HOSPITAL_COMMUNITY)

## 2025-01-26 ENCOUNTER — Ambulatory Visit: Admitting: Family Medicine

## 2025-01-29 ENCOUNTER — Ambulatory Visit: Admitting: "Endocrinology

## 2025-04-01 ENCOUNTER — Ambulatory Visit: Admitting: Dermatology
